# Patient Record
Sex: Male | Born: 1960 | Race: White | Hispanic: No | Marital: Married | Smoking: Never smoker
Health system: Southern US, Community
[De-identification: ages and names within clinical notes are randomized; demographics above are authoritative.]

## PROBLEM LIST (undated history)

## (undated) DIAGNOSIS — I350 Nonrheumatic aortic (valve) stenosis: Secondary | ICD-10-CM

## (undated) DIAGNOSIS — K219 Gastro-esophageal reflux disease without esophagitis: Secondary | ICD-10-CM

## (undated) DIAGNOSIS — Z789 Other specified health status: Secondary | ICD-10-CM

## (undated) DIAGNOSIS — M1990 Unspecified osteoarthritis, unspecified site: Secondary | ICD-10-CM

## (undated) DIAGNOSIS — D649 Anemia, unspecified: Secondary | ICD-10-CM

## (undated) DIAGNOSIS — K769 Liver disease, unspecified: Secondary | ICD-10-CM

## (undated) DIAGNOSIS — G4730 Sleep apnea, unspecified: Secondary | ICD-10-CM

## (undated) DIAGNOSIS — E039 Hypothyroidism, unspecified: Secondary | ICD-10-CM

## (undated) DIAGNOSIS — R0600 Dyspnea, unspecified: Secondary | ICD-10-CM

## (undated) DIAGNOSIS — K759 Inflammatory liver disease, unspecified: Secondary | ICD-10-CM

## (undated) DIAGNOSIS — R55 Syncope and collapse: Secondary | ICD-10-CM

## (undated) DIAGNOSIS — H905 Unspecified sensorineural hearing loss: Secondary | ICD-10-CM

## (undated) DIAGNOSIS — H9190 Unspecified hearing loss, unspecified ear: Secondary | ICD-10-CM

## (undated) DIAGNOSIS — Z9889 Other specified postprocedural states: Secondary | ICD-10-CM

## (undated) DIAGNOSIS — R112 Nausea with vomiting, unspecified: Secondary | ICD-10-CM

## (undated) DIAGNOSIS — J449 Chronic obstructive pulmonary disease, unspecified: Secondary | ICD-10-CM

## (undated) DIAGNOSIS — Z87442 Personal history of urinary calculi: Secondary | ICD-10-CM

## (undated) DIAGNOSIS — E785 Hyperlipidemia, unspecified: Secondary | ICD-10-CM

## (undated) DIAGNOSIS — I10 Essential (primary) hypertension: Secondary | ICD-10-CM

## (undated) DIAGNOSIS — I509 Heart failure, unspecified: Secondary | ICD-10-CM

## (undated) DIAGNOSIS — R1900 Intra-abdominal and pelvic swelling, mass and lump, unspecified site: Secondary | ICD-10-CM

## (undated) DIAGNOSIS — E669 Obesity, unspecified: Secondary | ICD-10-CM

## (undated) DIAGNOSIS — I4892 Unspecified atrial flutter: Secondary | ICD-10-CM

## (undated) DIAGNOSIS — K7460 Unspecified cirrhosis of liver: Secondary | ICD-10-CM

## (undated) DIAGNOSIS — F1090 Alcohol use, unspecified, uncomplicated: Secondary | ICD-10-CM

## (undated) DIAGNOSIS — N400 Enlarged prostate without lower urinary tract symptoms: Secondary | ICD-10-CM

## (undated) DIAGNOSIS — Z950 Presence of cardiac pacemaker: Secondary | ICD-10-CM

## (undated) DIAGNOSIS — E079 Disorder of thyroid, unspecified: Secondary | ICD-10-CM

## (undated) DIAGNOSIS — Z7289 Other problems related to lifestyle: Secondary | ICD-10-CM

## (undated) DIAGNOSIS — I340 Nonrheumatic mitral (valve) insufficiency: Secondary | ICD-10-CM

## (undated) DIAGNOSIS — I459 Conduction disorder, unspecified: Secondary | ICD-10-CM

## (undated) DIAGNOSIS — R609 Edema, unspecified: Secondary | ICD-10-CM

## (undated) HISTORY — DX: Liver disease, unspecified: K76.9

## (undated) HISTORY — DX: Essential (primary) hypertension: I10

## (undated) HISTORY — PX: PACEMAKER INSERTION: SHX728

## (undated) HISTORY — DX: Conduction disorder, unspecified: I45.9

## (undated) HISTORY — DX: Other problems related to lifestyle: Z72.89

## (undated) HISTORY — DX: Syncope and collapse: R55

## (undated) HISTORY — DX: Gastro-esophageal reflux disease without esophagitis: K21.9

## (undated) HISTORY — DX: Nonrheumatic aortic (valve) stenosis: I35.0

## (undated) HISTORY — DX: Sleep apnea, unspecified: G47.30

## (undated) HISTORY — DX: Nonrheumatic mitral (valve) insufficiency: I34.0

## (undated) HISTORY — DX: Intra-abdominal and pelvic swelling, mass and lump, unspecified site: R19.00

## (undated) HISTORY — PX: TONSILLECTOMY AND ADENOIDECTOMY: SUR1326

## (undated) HISTORY — DX: Anemia, unspecified: D64.9

## (undated) HISTORY — DX: Benign prostatic hyperplasia without lower urinary tract symptoms: N40.0

## (undated) HISTORY — DX: Other specified health status: Z78.9

## (undated) HISTORY — DX: Hyperlipidemia, unspecified: E78.5

## (undated) HISTORY — PX: NASAL SINUS SURGERY: SHX719

## (undated) HISTORY — PX: TRANSURETHRAL RESECTION OF PROSTATE: SHX73

## (undated) HISTORY — DX: Obesity, unspecified: E66.9

## (undated) HISTORY — PX: REPAIR HYPOSPADIAS W/ URETHROPLASTY: SUR1184

## (undated) HISTORY — DX: Alcohol use, unspecified, uncomplicated: F10.90

## (undated) HISTORY — DX: Unspecified sensorineural hearing loss: H90.5

---

## 1988-09-20 HISTORY — PX: ORCHIECTOMY: SHX2116

## 1993-09-20 HISTORY — PX: A-V CARDIAC PACEMAKER INSERTION: SHX562

## 2006-09-20 HISTORY — PX: COLONOSCOPY: SHX5424

## 2006-09-30 ENCOUNTER — Ambulatory Visit: Payer: Self-pay | Admitting: Cardiology

## 2006-09-30 ENCOUNTER — Ambulatory Visit (HOSPITAL_COMMUNITY): Admission: RE | Admit: 2006-09-30 | Discharge: 2006-09-30 | Payer: Self-pay | Admitting: Cardiology

## 2006-10-28 ENCOUNTER — Ambulatory Visit: Payer: Self-pay | Admitting: Cardiology

## 2006-12-09 ENCOUNTER — Emergency Department (HOSPITAL_COMMUNITY): Admission: EM | Admit: 2006-12-09 | Discharge: 2006-12-09 | Payer: Self-pay | Admitting: Emergency Medicine

## 2006-12-20 DIAGNOSIS — E669 Obesity, unspecified: Secondary | ICD-10-CM

## 2006-12-20 HISTORY — DX: Obesity, unspecified: E66.9

## 2006-12-22 ENCOUNTER — Ambulatory Visit: Payer: Self-pay | Admitting: Cardiovascular Disease

## 2006-12-23 ENCOUNTER — Inpatient Hospital Stay (HOSPITAL_COMMUNITY): Admission: EM | Admit: 2006-12-23 | Discharge: 2006-12-24 | Payer: Self-pay | Admitting: Emergency Medicine

## 2006-12-30 ENCOUNTER — Encounter (HOSPITAL_COMMUNITY): Admission: RE | Admit: 2006-12-30 | Discharge: 2007-01-29 | Payer: Self-pay | Admitting: Cardiology

## 2006-12-30 ENCOUNTER — Ambulatory Visit: Payer: Self-pay | Admitting: Cardiology

## 2007-01-04 ENCOUNTER — Ambulatory Visit: Payer: Self-pay | Admitting: Cardiology

## 2007-01-06 ENCOUNTER — Emergency Department (HOSPITAL_COMMUNITY): Admission: EM | Admit: 2007-01-06 | Discharge: 2007-01-06 | Payer: Self-pay | Admitting: Emergency Medicine

## 2007-01-26 ENCOUNTER — Emergency Department (HOSPITAL_COMMUNITY): Admission: EM | Admit: 2007-01-26 | Discharge: 2007-01-26 | Payer: Self-pay | Admitting: Emergency Medicine

## 2007-02-06 ENCOUNTER — Emergency Department (HOSPITAL_COMMUNITY): Admission: EM | Admit: 2007-02-06 | Discharge: 2007-02-06 | Payer: Self-pay | Admitting: Emergency Medicine

## 2007-02-09 ENCOUNTER — Emergency Department (HOSPITAL_COMMUNITY): Admission: EM | Admit: 2007-02-09 | Discharge: 2007-02-09 | Payer: Self-pay | Admitting: Emergency Medicine

## 2007-02-22 ENCOUNTER — Ambulatory Visit: Payer: Self-pay | Admitting: Internal Medicine

## 2007-02-23 ENCOUNTER — Emergency Department (HOSPITAL_COMMUNITY): Admission: EM | Admit: 2007-02-23 | Discharge: 2007-02-23 | Payer: Self-pay | Admitting: Emergency Medicine

## 2007-05-08 ENCOUNTER — Ambulatory Visit: Payer: Self-pay | Admitting: Cardiology

## 2007-06-29 ENCOUNTER — Emergency Department (HOSPITAL_COMMUNITY): Admission: EM | Admit: 2007-06-29 | Discharge: 2007-06-29 | Payer: Self-pay | Admitting: Emergency Medicine

## 2007-08-03 ENCOUNTER — Ambulatory Visit: Payer: Self-pay | Admitting: Cardiology

## 2007-09-28 ENCOUNTER — Emergency Department (HOSPITAL_COMMUNITY): Admission: EM | Admit: 2007-09-28 | Discharge: 2007-09-29 | Payer: Self-pay | Admitting: Emergency Medicine

## 2007-11-23 ENCOUNTER — Ambulatory Visit: Payer: Self-pay | Admitting: Cardiology

## 2007-12-18 ENCOUNTER — Inpatient Hospital Stay (HOSPITAL_COMMUNITY): Admission: EM | Admit: 2007-12-18 | Discharge: 2007-12-22 | Payer: Self-pay | Admitting: Emergency Medicine

## 2007-12-20 ENCOUNTER — Ambulatory Visit: Payer: Self-pay | Admitting: Internal Medicine

## 2008-01-23 ENCOUNTER — Ambulatory Visit: Payer: Self-pay | Admitting: Internal Medicine

## 2008-02-09 ENCOUNTER — Ambulatory Visit: Payer: Self-pay | Admitting: Internal Medicine

## 2008-02-09 ENCOUNTER — Ambulatory Visit (HOSPITAL_COMMUNITY): Admission: RE | Admit: 2008-02-09 | Discharge: 2008-02-09 | Payer: Self-pay | Admitting: Internal Medicine

## 2008-02-09 ENCOUNTER — Encounter: Payer: Self-pay | Admitting: Internal Medicine

## 2008-02-27 ENCOUNTER — Emergency Department (HOSPITAL_COMMUNITY): Admission: EM | Admit: 2008-02-27 | Discharge: 2008-02-27 | Payer: Self-pay | Admitting: Emergency Medicine

## 2008-03-20 ENCOUNTER — Ambulatory Visit: Payer: Self-pay | Admitting: Internal Medicine

## 2008-03-27 ENCOUNTER — Ambulatory Visit: Payer: Self-pay | Admitting: Gastroenterology

## 2008-06-04 ENCOUNTER — Ambulatory Visit: Payer: Self-pay | Admitting: Cardiology

## 2008-06-05 ENCOUNTER — Ambulatory Visit: Payer: Self-pay | Admitting: Internal Medicine

## 2008-06-12 ENCOUNTER — Ambulatory Visit: Payer: Self-pay | Admitting: Internal Medicine

## 2008-06-13 ENCOUNTER — Ambulatory Visit: Payer: Self-pay | Admitting: Cardiology

## 2008-06-13 ENCOUNTER — Inpatient Hospital Stay (HOSPITAL_COMMUNITY): Admission: EM | Admit: 2008-06-13 | Discharge: 2008-06-16 | Payer: Self-pay | Admitting: Emergency Medicine

## 2008-06-14 ENCOUNTER — Ambulatory Visit: Payer: Self-pay | Admitting: Cardiovascular Disease

## 2008-06-14 ENCOUNTER — Other Ambulatory Visit: Payer: Self-pay | Admitting: Cardiology

## 2008-06-15 ENCOUNTER — Other Ambulatory Visit: Payer: Self-pay | Admitting: Cardiology

## 2008-06-16 ENCOUNTER — Other Ambulatory Visit: Payer: Self-pay | Admitting: Cardiology

## 2008-06-21 ENCOUNTER — Ambulatory Visit: Payer: Self-pay | Admitting: Cardiology

## 2008-06-27 DIAGNOSIS — E119 Type 2 diabetes mellitus without complications: Secondary | ICD-10-CM

## 2008-06-27 DIAGNOSIS — D649 Anemia, unspecified: Secondary | ICD-10-CM | POA: Insufficient documentation

## 2008-06-27 DIAGNOSIS — I459 Conduction disorder, unspecified: Secondary | ICD-10-CM | POA: Insufficient documentation

## 2008-06-27 DIAGNOSIS — H905 Unspecified sensorineural hearing loss: Secondary | ICD-10-CM

## 2008-07-15 ENCOUNTER — Ambulatory Visit (HOSPITAL_COMMUNITY): Admission: RE | Admit: 2008-07-15 | Discharge: 2008-07-15 | Payer: Self-pay | Admitting: Internal Medicine

## 2008-07-18 ENCOUNTER — Ambulatory Visit: Payer: Self-pay | Admitting: Internal Medicine

## 2008-07-22 ENCOUNTER — Ambulatory Visit: Payer: Self-pay | Admitting: Cardiology

## 2008-07-29 ENCOUNTER — Ambulatory Visit: Payer: Self-pay | Admitting: Internal Medicine

## 2008-08-14 ENCOUNTER — Encounter: Payer: Self-pay | Admitting: Gastroenterology

## 2008-08-14 ENCOUNTER — Encounter (INDEPENDENT_AMBULATORY_CARE_PROVIDER_SITE_OTHER): Payer: Self-pay

## 2008-08-14 LAB — CONVERTED CEMR LAB
ALT: 29 units/L (ref 0–53)
AST: 35 units/L (ref 0–37)
Albumin: 4.5 g/dL (ref 3.5–5.2)
Alkaline Phosphatase: 191 units/L — ABNORMAL HIGH (ref 39–117)
Basophils Absolute: 0.1 10*3/uL (ref 0.0–0.1)
Bilirubin, Direct: 0.5 mg/dL — ABNORMAL HIGH (ref 0.0–0.3)
Eosinophils Absolute: 0.3 10*3/uL (ref 0.0–0.7)
HCT: 37.5 % — ABNORMAL LOW (ref 39.0–52.0)
Hemoglobin: 10.6 g/dL — ABNORMAL LOW (ref 13.0–17.0)
Indirect Bilirubin: 1.7 mg/dL — ABNORMAL HIGH (ref 0.0–0.9)
Lymphocytes Relative: 17 % (ref 12–46)
Lymphs Abs: 1.8 10*3/uL (ref 0.7–4.0)
MCV: 82.8 fL
Monocytes Absolute: 1.4 10*3/uL — ABNORMAL HIGH (ref 0.1–1.0)
Neutro Abs: 7.2 10*3/uL (ref 1.7–7.7)
Neutrophils Relative %: 67 % (ref 43–77)
RBC: 4.53 M/uL (ref 4.22–5.81)
Total Bilirubin: 2.2 mg/dL — ABNORMAL HIGH (ref 0.3–1.2)
Total Protein: 6.6 g/dL (ref 6.0–8.3)
WBC: 10.7 10*3/uL
WBC: 10.7 10*3/uL — ABNORMAL HIGH (ref 4.0–10.5)

## 2008-09-27 ENCOUNTER — Encounter (INDEPENDENT_AMBULATORY_CARE_PROVIDER_SITE_OTHER): Payer: Self-pay | Admitting: General Surgery

## 2008-09-27 ENCOUNTER — Ambulatory Visit (HOSPITAL_COMMUNITY): Admission: RE | Admit: 2008-09-27 | Discharge: 2008-09-27 | Payer: Self-pay | Admitting: General Surgery

## 2008-10-10 ENCOUNTER — Ambulatory Visit: Payer: Self-pay | Admitting: Cardiology

## 2008-10-29 ENCOUNTER — Emergency Department (HOSPITAL_COMMUNITY): Admission: EM | Admit: 2008-10-29 | Discharge: 2008-10-29 | Payer: Self-pay | Admitting: Emergency Medicine

## 2008-10-31 ENCOUNTER — Ambulatory Visit: Payer: Self-pay | Admitting: Cardiology

## 2008-10-31 ENCOUNTER — Encounter: Payer: Self-pay | Admitting: Internal Medicine

## 2008-11-01 ENCOUNTER — Ambulatory Visit: Payer: Self-pay | Admitting: Cardiology

## 2008-11-01 ENCOUNTER — Encounter: Payer: Self-pay | Admitting: Physician Assistant

## 2008-11-15 ENCOUNTER — Ambulatory Visit: Payer: Self-pay | Admitting: Internal Medicine

## 2008-12-02 ENCOUNTER — Encounter: Payer: Self-pay | Admitting: Internal Medicine

## 2009-01-17 ENCOUNTER — Emergency Department (HOSPITAL_COMMUNITY): Admission: EM | Admit: 2009-01-17 | Discharge: 2009-01-17 | Payer: Self-pay | Admitting: Emergency Medicine

## 2009-01-17 ENCOUNTER — Encounter (INDEPENDENT_AMBULATORY_CARE_PROVIDER_SITE_OTHER): Payer: Self-pay | Admitting: *Deleted

## 2009-02-06 ENCOUNTER — Encounter: Payer: Self-pay | Admitting: Cardiology

## 2009-02-06 ENCOUNTER — Ambulatory Visit: Payer: Self-pay | Admitting: Cardiology

## 2009-02-19 ENCOUNTER — Telehealth: Payer: Self-pay | Admitting: Cardiology

## 2009-05-31 ENCOUNTER — Emergency Department: Payer: Self-pay | Admitting: Emergency Medicine

## 2009-08-12 ENCOUNTER — Ambulatory Visit: Payer: Self-pay | Admitting: Cardiology

## 2009-09-08 ENCOUNTER — Encounter: Payer: Self-pay | Admitting: Internal Medicine

## 2009-09-08 ENCOUNTER — Ambulatory Visit: Payer: Self-pay | Admitting: Cardiology

## 2009-12-20 ENCOUNTER — Emergency Department (HOSPITAL_COMMUNITY): Admission: EM | Admit: 2009-12-20 | Discharge: 2009-12-20 | Payer: Self-pay | Admitting: Emergency Medicine

## 2010-01-27 LAB — CONVERTED CEMR LAB
Creatinine, Ser: 1.13 mg/dL
Glucose, Bld: 110 mg/dL
Hgb A1c MFr Bld: 6.8 %
Sodium: 140 meq/L
TSH: 1.77 microintl units/mL

## 2010-03-18 ENCOUNTER — Ambulatory Visit: Payer: Self-pay | Admitting: Internal Medicine

## 2010-03-18 DIAGNOSIS — Z950 Presence of cardiac pacemaker: Secondary | ICD-10-CM | POA: Insufficient documentation

## 2010-05-19 ENCOUNTER — Ambulatory Visit: Payer: Self-pay | Admitting: Cardiology

## 2010-05-19 DIAGNOSIS — K219 Gastro-esophageal reflux disease without esophagitis: Secondary | ICD-10-CM

## 2010-05-19 LAB — CONVERTED CEMR LAB
HDL: 33 mg/dL — ABNORMAL LOW (ref >39)
Total CHOL/HDL Ratio: 4.3
VLDL: 36 mg/dL (ref 0–40)

## 2010-10-02 ENCOUNTER — Ambulatory Visit: Admit: 2010-10-02 | Payer: Self-pay | Admitting: Internal Medicine

## 2010-10-18 ENCOUNTER — Encounter: Payer: Self-pay | Admitting: Internal Medicine

## 2010-10-20 NOTE — Assessment & Plan Note (Signed)
Summary: 6 mth f/u per checkout on 09/08/09/tg   Visit Type:  Follow-up Referring Provider:  Leticia Penna Primary Provider:  Illene Regulus  CC:  little chest discomfort last week.  History of Present Illness: [name] returns today for followup.  He is a very pleasant, middle-aged male with symptomatic bradycardia, diabetes, hypertension, dyslipidemia, and sleep apnea who returns today for followup.  He admits to dietary indiscretion.  He denies c/p or sob.  He has had some problems with peripheral edema. No syncope.  Current Medications (verified): 1)  Amlodipine Besylate 2.5 Mg Tabs (Amlodipine Besylate) .... Take 1 Tablet By Mouth Once A Day 2)  Atenolol 50 Mg Tabs (Atenolol) .... Take One Tablet By Mouth Daily 3)  Aspirin 81 Mg Tbec (Aspirin) .... Take One Tablet By Mouth Daily 4)  Fexofenadine Hcl 180 Mg Tabs (Fexofenadine Hcl) .... Take 1 Tab Daily 5)  Centrum  Tabs (Multiple Vitamins-Minerals) .Marland Kitchen.. 1 Daily 6)  Actos 45 Mg Tabs (Pioglitazone Hcl) .Marland Kitchen.. 1 Daily 7)  Citalopram Hydrobromide 20 Mg Tabs (Citalopram Hydrobromide) .Marland Kitchen.. 1 Daily 8)  Enalapril Maleate 5 Mg Tabs (Enalapril Maleate) .... Take One Tablet By Mouth Q Day 9)  Metformin Hcl 500 Mg Tabs (Metformin Hcl) .... Take 2 Tabs Two Times A Day 10)  Advair Diskus 100-50 Mcg/dose Misc (Fluticasone-Salmeterol) .... Every 12 Hours 11)  Nitroglycerin 0.4 Mg Subl (Nitroglycerin) .... One Tablet Under Tongue Every 5 Minutes As Needed For Chest Pain---May Repeat Times Three 12)  Mylanta Supreme 400-135 Mg/49ml Susp (Ca Carbonate-Mag Hydroxide) .... As Needed 13)  Omeprazole 40 Mg Cpdr (Omeprazole) .... Take 1 Tablet By Mouth Once A Day 14)  Nasonex 50 Mcg/act Susp (Mometasone Furoate) .... 2 Sprays Each Nostril Once Daily 15)  Levoxyl 50 Mcg Tabs (Levothyroxine Sodium) .... Take 1 Tab Daily 16)  Glucosamine-Chondroitin 1500-1200 Mg/47ml Liqd (Glucosamine-Chondroitin) .... Take 1 Tab Daily  Allergies (verified): 1)  ! Pcn  Past  History:  Past Medical History: Last updated: 08/12/2009 Chest pain.  2002 & 2009: cardiac catheterization-> normal coronary       arteries; nl. left ventricular function. Conduction System Disease: status post pacemaker implantation in 1995.  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 Hypertension  Dyslipidemia; statin discontinued due to abn. LFTs  Gastroesophageal reflux disease; status post multiple dilatations for stricture Congenital deafness Sleep apnea: BiPAP and continuous oxygen Obesity:4/08: BMI=40 Anemia Remote alcohol use      Past Surgical History: Last updated: 08/12/2009 Orchiectomy-bilateral circa 1990-? neoplasm Repair of hypospadia TURP for BPH Tonsillectomy Duel chamber pacemaker implantation-1995  Review of Systems       The patient complains of peripheral edema.  The patient denies chest pain, syncope, and dyspnea on exertion.    Vital Signs:  Patient profile:   50 year old male Weight:      196 pounds BMI:     33.76 Pulse rate:   95 / minute BP sitting:   111 / 74  (right arm)  Vitals Entered By: Dreama Saa, CNA (March 18, 2010 10:11 AM)  Physical Exam  General:   General-Well developed; no acute distress; overweight   Neck-No JVD; no carotid bruits: Lungs-No tachypnea, no rales; no rhonchi; no wheezes: Well healed PPM incision. Cardiovascular-normal PMI; normal S1 and S2; grade 2/6 systolic ejection murmur at the cardiac base Abdomen-BS normal; soft and non-tender without masses or organomegaly:  Musculoskeletal-No  deformities, no cyanosis or clubbing: Neurologic-Normal cranial nerves; symmetric strength and tone:  Skin-Warm,smooth and hairless skin over the lower extremities with surface erythema and desquamation Extremities-Nl distal pulses; no  edema:     PPM Specifications Following MD:  Lewayne Bunting, MD     PPM Vendor:  Medtronic     PPM Model Number:  [id number]     PPM Serial Number:  [id number] PPM DOI:  06/12/2004      Lead 1    Location: [facility]     DOI: 06/12/2004     Model #: 4524     Serial #: [id number]     Status: active Lead 2    Location: [facility]     DOI: 06/12/2004     Model #: [id number]     Serial #: [id number]      Magnet Response Rate:  BOL 85 ERI 65  Indications:  Brady  Explantation Comments:  No Carelink +Coumadin  PPM Follow Up Battery Voltage:  2.76 V     Battery Est. Longevity:  3.87yrs     Pacer Dependent:  No       PPM Device Measurements Atrium  Amplitude: 2.80 mV, Impedance: 366 ohms, Threshold: 1.00 V at 0.40 msec Right Ventricle  Amplitude: 11.20 mV, Impedance: 483 ohms, Threshold: 0.50 V at 0.40 msec  Episodes MS Episodes:  196     Percent Mode Switch:  <0.1%     Coumadin:  No Ventricular High Rate:  0     Atrial Pacing:  69.8%     Ventricular Pacing:  61.9%  Parameters Mode:  DDDR     Lower Rate Limit:  60     Upper Rate Limit:  130 Paced AV Delay:  150     Sensed AV Delay:  120 Tech Comments:  LONGEST AHR EPISODE 9 MINUTES 8 SECONDS W/MAX A RATE AT 239.  NORMAL DEVICE FUNCTION.  CHANGED A AMPLITUDE FROM 1.750 TO 2.00 V.  ROV IN CLINIC RDS. Vella Kohler  March 18, 2010 10:59 AM MD Comments:  Agree with above.    Impression & Recommendations:  Problem # 1:  CARDIAC PACEMAKER IN SITU (ICD-V45.01) His device is working normally.  Will recheck in several months.  Problem # 2:  HYPERTENSION, BENIGN (ICD-401.1) His blood pressure is well controlled.  We discussed the importance of a low sodium diet.  He will continue meds as below. His updated medication list for this problem includes:    Amlodipine Besylate 2.5 Mg Tabs (Amlodipine besylate) .Marland Kitchen... Take 1 tablet by mouth once a day    Atenolol 50 Mg Tabs (Atenolol) .Marland Kitchen... Take one tablet by mouth daily    Aspirin 81 Mg Tbec (Aspirin) .Marland Kitchen... Take one  tablet by mouth daily    Enalapril Maleate 5 Mg Tabs (Enalapril maleate) .Marland Kitchen... Take one tablet by mouth q day  Problem # 3:  MORBID OBESITY (ICD-278.01) He continues to have problems with dietary indiscretion.  I have encouraged him on weight loss.  Patient Instructions: 1)  Your physician recommends that you schedule a follow-up appointment in: 1 year 2)  Your physician recommends that you continue on your current medications as directed. Please refer to the Current Medication list given to you today.

## 2010-10-20 NOTE — Assessment & Plan Note (Signed)
Summary: [name]   Visit Type:  Follow-up Referring [name]:  [facility] Primary [name]:  Dr. Karrie Doffing   History of Present Illness: [name] returns to the office as scheduled for continuing assessment and treatment of multiple issues including conduction system disease with permanent pacemaker placement in 1995, generator replacement in 2005, mild aortic stenosis, hypertension, hyperlipidemia in the setting of diabetes, obesity with sleep apnea and chest pain with normal coronary arteries in 05/2008.  Over the past 9 months ago, his health status has been better than at any time since I first met him 4 years ago.  He has had no chest discomfort and has used no nitroglycerin.  He is active, caring for his yard, without any difficulty.  He denies dyspnea, orthopnea, PND, lightheadedness or syncope.  Patient reports that he is not being treated with a statin due to hepatic abnormalities.  He has had mild transaminase elevations in the past with an inconclusive laparoscopic liver biopsy in 2010.  He was previously thought to have NASH, but this was not apparent on biopsy.  -  Date:  01/27/2010    HgbA1c: 6.8    TSH: 1.77    BG Random: 110    BUN: 25    Creatinine: 1.13    Sodium: 140    Potassium: 4.9    Chloride: 103    CO2 Total: 22   Current Medications (verified): 1)  Amlodipine Besylate 2.5 Mg Tabs (Amlodipine Besylate) .... Take 1 Tablet By Mouth Once A Day 2)  Atenolol 50 Mg Tabs (Atenolol) .... Take One Tablet By Mouth Daily 3)  Aspirin 81 Mg Tbec (Aspirin) .... Take One Tablet By Mouth Daily 4)  Fexofenadine Hcl 180 Mg Tabs (Fexofenadine Hcl) .... Take 1 Tab Daily 5)  Centrum  Tabs (Multiple Vitamins-Minerals) .Marland Kitchen.. 1 Daily 6)  Actos 45 Mg Tabs (Pioglitazone Hcl) .Marland Kitchen.. 1 Daily 7)  Citalopram Hydrobromide 20 Mg Tabs (Citalopram Hydrobromide) .Marland Kitchen.. 1 Daily 8)  Enalapril Maleate 5 Mg Tabs (Enalapril Maleate) .... Take One Tablet By Mouth Q Day 9)  Advair Diskus  100-50 Mcg/dose Misc (Fluticasone-Salmeterol) .... Every 12 Hours 10)  Nitroglycerin 0.4 Mg Subl (Nitroglycerin) .... One Tablet Under Tongue Every 5 Minutes As Needed For Chest Pain---May Repeat Times Three 11)  Mylanta Supreme 400-135 Mg/15ml Susp (Ca Carbonate-Mag Hydroxide) .... As Needed 12)  Omeprazole 40 Mg Cpdr (Omeprazole) .... Take 1 Tablet By Mouth Once A Day 13)  Nasonex 50 Mcg/act Susp (Mometasone Furoate) .... 2 Sprays Each Nostril Once Daily 14)  Levoxyl 50 Mcg Tabs (Levothyroxine Sodium) .... Take 1 Tab Daily 15)  Glucosamine-Chondroitin 1500-1200 Mg/61ml Liqd (Glucosamine-Chondroitin) .... Take 1 Tab Daily  Allergies (verified): 1)  ! Pcn  Past History:  PMH, FH, and Social History reviewed and updated.  Past Medical History: Chest pain.  2002 & 2009: cardiac catheterization-> normal coronary       arteries; nl. left ventricular function. Conduction System Disease: status post pacemaker implantation in 1995.  Generator replaced 2005  Aortic stenosis-mild Mitral regurgitation-insignificant;  Hypertension  Dyslipidemia; statin discontinued due to abn. 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 Benign prostatic hypertrophy with urinary retention; repaired hypospadia; transurethral resection of the prostate  Gastroesophageal reflux disease; status post multiple dilatations for stricture Congenital deafness Sleep apnea: BiPAP and continuous oxygen Obesity:4/08: BMI=40 Anemia Remote alcohol use      Past Surgical  History: Orchiectomy-bilateral circa 1990-? neoplasm Repair of hypospadia TURP for BPH Tonsillectomy and adenoidectomy Duel chamber pacemaker implantation-1995  Review of Systems       See history of present illness.  Vital Signs:  Patient profile:   50 year old male Weight:      199  pounds BMI:     34.28 Pulse rate:   80 / minute BP sitting:   101 / 67  (right arm)  Vitals Entered By: Dreama Saa, CNA (May 19, 2010 10:54 AM)  Physical Exam  General:  Obese; well developed; no acute distress; HEENT-extorted left eye   Neck-No JVD; no carotid bruits, but a faint transmitted bruit to the left carotid is noted Lungs-No tachypnea, no rales; no rhonchi; no wheezes Cardiovascular-normal PMI; distant S1; decreased S2; grade 2/6 systolic ejection murmur at the cardiac base Abdomen-BS normal; soft and non-tender without masses or organomegaly:  Musculoskeletal-No deformities, no cyanosis or clubbing: Neurologic-Normal cranial nerves; symmetric strength and tone:  Skin-Warm,smooth and hairless skin over the lower extremities with surface erythema and desquamation Extremities-Nl distal pulses; no edema:     PPM Specifications Following MD:  Lewayne Bunting, MD     PPM Vendor:  [employer]     PPM Model Number:  [id number]     PPM Serial Number:  [id number] PPM DOI:  06/12/2004      Lead 1    Location: [facility]     DOI: 06/12/2004     Model #: 4524     Serial #: [id number]     Status: active Lead 2    Location: [facility]     DOI: 06/12/2004     Model #: [id number]     Serial #: [id number]      Magnet Response Rate:  BOL 85 ERI 65  Indications:  Brady  Explantation Comments:  No Carelink +Coumadin  PPM Follow Up Pacer Dependent:  No      Episodes Coumadin:  No  Parameters Mode:  DDDR     Lower Rate Limit:  60     Upper Rate Limit:  130 Paced AV Delay:  150     Sensed AV Delay:  120  Impression & Recommendations:  Problem # 1:  CARDIAC PACEMAKER IN SITU (ICD-V45.01) Evaluated in person by Dr. Ladona Ridgel every 6 months; home monitoring is not being utilized.  Problem # 2:  AORTIC STENOSIS/ INSUFFICIENCY, NON-RHEUMATIC (ICD-424.1) No symptoms.  A repeat echocardiogram will be obtained in a year or 2.  Problem # 3:  HYPERTENSION (ICD-401.1) Blood pressure is well controlled.  Problem #  4:  HYPERLIPIDEMIA (ICD-272.2) In the setting of diabetes, treatment is warranted; however, patient raises an issue with respect to hepatic disease.  Prior records were obtained and reviewed.  Biopsy in 2010 did not show a reason for abnormal LFTs.  Peak SGOT and SGPT were well less than 3 x the upper limit of normal.  A CMet will be obtained.   Most likely, a statin he has not previously taken can be added to his medical regime and titrated.  If laboratory results are acceptable, I will plan to reassess this nice gentleman in 11 months.  Other Orders: T-Lipid Profile [phone])  Patient Instructions: 1)  Your physician recommends that you schedule a follow-up appointment in: 11 months (same day as wife's appt.) 2)  Your physician recommends that you continue on your current medications as directed. Please refer to the Current Medication list given to you today. 3)  Your physician recommends that  you return for lab work in: Today

## 2010-10-20 NOTE — Cardiovascular Report (Signed)
Summary: Office Visit   Office Visit   Imported By: Roderic Ovens 09/25/2009 14:08:57  _____________________________________________________________________  External Attachment:    Type:   Image     Comment:   External Document

## 2010-10-28 NOTE — Miscellaneous (Signed)
Summary: corrected device information  Clinical Lists Changes  Observations: Added new observation of PPMLEADSTAT2: active (10/18/2010 15:24) Added new observation of PPMLEADSER2: [id number] (10/18/2010 15:24) Added new observation of PPMLEADMOD2: 5024  (10/18/2010 15:24)      PPM Specifications Following MD:  Lewayne Bunting, MD     PPM Vendor:  [employer]     PPM Model Number:  [id number]     PPM Serial Number:  [id number] PPM DOI:  06/12/2004      Lead 1    Location: [facility]     DOI: 06/12/2004     Model #: 4524     Serial #: [id number]     Status: active Lead 2    Location: [facility]     DOI: 06/12/2004     Model #: 4696     Serial #: [id number]     Status: active  Magnet Response Rate:  BOL 85 ERI 65  Indications:  Huston Foley  Explantation Comments:  No Carelink +Coumadin  PPM Follow Up Pacer Dependent:  No      Episodes Coumadin:  No  Parameters Mode:  DDDR     Lower Rate Limit:  60     Upper Rate Limit:  130 Paced AV Delay:  150     Sensed AV Delay:  120

## 2010-11-30 ENCOUNTER — Encounter (INDEPENDENT_AMBULATORY_CARE_PROVIDER_SITE_OTHER): Payer: PRIVATE HEALTH INSURANCE

## 2010-11-30 ENCOUNTER — Encounter: Payer: Self-pay | Admitting: Internal Medicine

## 2010-11-30 DIAGNOSIS — I495 Sick sinus syndrome: Secondary | ICD-10-CM

## 2010-12-08 NOTE — Cardiovascular Report (Signed)
Summary: Office Visit   Office Visit   Imported By: Roderic Ovens 12/04/2010 15:32:17  _____________________________________________________________________  External Attachment:    Type:   Image     Comment:   External Document

## 2010-12-08 NOTE — Procedures (Signed)
Summary: pacer check/medtronic / SCH PER PT CALL.TMJ R.S PER PT WIFE/T...   Current Medications (verified): 1)  Amlodipine Besylate 2.5 Mg Tabs (Amlodipine Besylate) .... Take 1 Tablet By Mouth Once A Day 2)  Atenolol 50 Mg Tabs (Atenolol) .... Take One Tablet By Mouth Daily 3)  Aspirin 81 Mg Tbec (Aspirin) .... Take One Tablet By Mouth Daily 4)  Centrum  Tabs (Multiple Vitamins-Minerals) .Marland Kitchen.. 1 Daily 5)  Actos 45 Mg Tabs (Pioglitazone Hcl) .Marland Kitchen.. 1 Daily 6)  Citalopram Hydrobromide 20 Mg Tabs (Citalopram Hydrobromide) .Marland Kitchen.. 1 Daily 7)  Enalapril Maleate 5 Mg Tabs (Enalapril Maleate) .... Take One Tablet By Mouth Q Day 8)  Nitroglycerin 0.4 Mg Subl (Nitroglycerin) .... One Tablet Under Tongue Every 5 Minutes As Needed For Chest Pain---May Repeat Times Three 9)  Mylanta Supreme 400-135 Mg/65ml Susp (Ca Carbonate-Mag Hydroxide) .... As Needed 10)  Omeprazole 40 Mg Cpdr (Omeprazole) .... Take 1 Tablet By Mouth Once A Day 11)  Nasonex 50 Mcg/act Susp (Mometasone Furoate) .... 2 Sprays Each Nostril Once Daily 12)  Levoxyl 50 Mcg Tabs (Levothyroxine Sodium) .... Take 1 Tab Daily 13)  Glucosamine-Chondroitin 1500-1200 Mg/33ml Liqd (Glucosamine-Chondroitin) .... Take 1 Tab Daily 14)  Glipizide 5 Mg Tabs (Glipizide) .... One By Mouth Daily 15)  Loratadine 10 Mg Tabs (Loratadine) .... One By Mouth Daily  Allergies (verified): 1)  ! Pcn   PPM Specifications Following MD:  Lewayne Bunting, MD     PPM Vendor:  [employer]     PPM Model Number:  [id number]     PPM Serial Number:  [id number] PPM DOI:  06/12/2004      Lead 1    Location: [facility]     DOI: 06/12/2004     Model #: 4524     Serial #: [id number]     Status: active Lead 2    Location: [facility]     DOI: 06/12/2004     Model #: 2130     Serial #: [id number]     Status: active  Magnet Response Rate:  BOL 85 ERI 65  Indications:  Huston Foley  Explantation Comments:  No Carelink   PPM Follow Up Pacer Dependent:  No      Episodes Coumadin:  No  Parameters Mode:   DDDR     Lower Rate Limit:  60     Upper Rate Limit:  130 Paced AV Delay:  150     Sensed AV Delay:  120 Tech Comments:  See [employer]

## 2011-01-04 LAB — BASIC METABOLIC PANEL
BUN: 17 mg/dL (ref 6–23)
CO2: 27 mEq/L (ref 19–32)
Chloride: 102 mEq/L (ref 96–112)
Creatinine, Ser: 1.02 mg/dL (ref 0.4–1.5)
GFR calc Af Amer: >60 mL/min (ref >60)
Potassium: 4.8 mEq/L (ref 3.5–5.1)

## 2011-01-04 LAB — CBC
HCT: 35.6 % — ABNORMAL LOW (ref 39.0–52.0)
MCHC: 31.8 g/dL (ref 30.0–36.0)
MCV: 78.9 fL (ref 78.0–100.0)
RBC: 4.52 MIL/uL (ref 4.22–5.81)

## 2011-01-04 LAB — HEPATIC FUNCTION PANEL
Alkaline Phosphatase: 198 U/L — ABNORMAL HIGH (ref 39–117)
Bilirubin, Direct: 0.5 mg/dL — ABNORMAL HIGH (ref 0.0–0.3)
Indirect Bilirubin: 1.3 mg/dL — ABNORMAL HIGH (ref 0.3–0.9)
Total Protein: 6 g/dL (ref 6.0–8.3)

## 2011-01-04 LAB — GLUCOSE, CAPILLARY
Glucose-Capillary: 149 mg/dL — ABNORMAL HIGH (ref 70–99)
Glucose-Capillary: 174 mg/dL — ABNORMAL HIGH (ref 70–99)

## 2011-01-05 LAB — DIFFERENTIAL
Basophils Absolute: 0.1 10*3/uL (ref 0.0–0.1)
Eosinophils Absolute: 0.2 10*3/uL (ref 0.0–0.7)
Eosinophils Relative: 2 % (ref 0–5)
Lymphs Abs: 1.2 10*3/uL (ref 0.7–4.0)
Neutrophils Relative %: 77 % (ref 43–77)

## 2011-01-05 LAB — POCT CARDIAC MARKERS
CKMB, poc: 10.8 ng/mL (ref 1.0–8.0)
CKMB, poc: 7.9 ng/mL (ref 1.0–8.0)
Myoglobin, poc: 225 ng/mL (ref 12–200)
Myoglobin, poc: 276 ng/mL (ref 12–200)
Troponin i, poc: <0.05 ng/mL (ref 0.00–0.09)

## 2011-01-05 LAB — CBC
MCV: 78.9 fL (ref 78.0–100.0)
Platelets: 213 10*3/uL (ref 150–400)
RDW: 20.5 % — ABNORMAL HIGH (ref 11.5–15.5)
WBC: 10.2 10*3/uL (ref 4.0–10.5)

## 2011-01-05 LAB — CK TOTAL AND CKMB (NOT AT ARMC): Relative Index: 4.2 — ABNORMAL HIGH (ref 0.0–2.5)

## 2011-01-05 LAB — PROTIME-INR
INR: 1.2 (ref 0.00–1.49)
Prothrombin Time: 15.8 seconds — ABNORMAL HIGH (ref 11.6–15.2)

## 2011-01-05 LAB — BASIC METABOLIC PANEL
BUN: 22 mg/dL (ref 6–23)
Chloride: 104 mEq/L (ref 96–112)
Creatinine, Ser: 1.01 mg/dL (ref 0.4–1.5)
GFR calc non Af Amer: >60 mL/min (ref >60)
Glucose, Bld: 117 mg/dL — ABNORMAL HIGH (ref 70–99)

## 2011-02-02 NOTE — H&P (Signed)
NAME:  [name], [name] NO.:  [id number]   MEDICAL RECORD NO.:  [id number]          PATIENT TYPE:  INP   LOCATION:  A321                          FACILITY:  [facility]   PHYSICIAN:  Osvaldo Shipper, MD     DATE OF BIRTH:  09/15/1961   DATE OF ADMISSION:  06/13/2008  DATE OF DISCHARGE:  LH                              HISTORY & PHYSICAL   PRIMARY CARE PHYSICIAN:  Karrie Doffing MD in [place].   CARDIOLOGIST:  Gerrit Friends. Dietrich Pates, MD, [facility] from [facility].   He also has a urologist that he used to see in [facility].   ADMISSION DIAGNOSIS:  1. Chest pain rule out acute coronary syndrome, rule out pulmonary      embolism.  2. History of type 2 diabetes.  3. Hypertension.  4. Chronic obstructive pulmonary disease.  5. Obstructive sleep apnea.   CHIEF COMPLAINT:  Chest pain since this evening.   HISTORY OF PRESENT ILLNESS:  Patient is 50 year old male who has a  history of pacemaker placement for significant bradycardia in the past  who was working when he experienced sharp retrosternal chest pain which  was 5/10 in intensity.  The patient was sitting while this pain started.  The pain increased with deep breathing and cough.  He was having a dry  cough as well.  He was also short of breath.  The patient is not a good  historian.  He is also hard of hearing which makes history very  difficult to obtain.  Apparently the patient was having pain yesterday  as well.  He admits to some wheezing with the shortness of breath.  No  other aggravating or relieving factors for the pain is identified.  No  nausea, vomiting, fever is present.  He has been experiencing chest pain  on and off for the past many months and he takes sublingual  nitroglycerin for this pain.  It is unclear if today's pain is similar  to the pain he has been having in the past.   MEDICATIONS AT HOME:  He is on the following:  1. Centrum Silver 1 tablet daily.  2. Aspirin 81  mg daily.  3. Metformin extended release 1000 mg b.i.d.  4. Actos 45 mg daily.  5. Nitroglycerin sublingually as needed.  6. Citalopram 20 mg at bedtime.  7. Protonix 40 mg daily.  8. Fexofenadine 180 mg daily.  9. Atenolol 50 mg daily.  10.Albuterol nebulizer four times a day.  11.Advair Diskus 500/50 b.i.d.   ALLERGIES:  He is allergic to PENICILLIN.   PAST MEDICAL HISTORY:  Is positive for aortic stenosis, type 2 diabetes,  dyslipidemia, hypertension, pacemaker placement, obstructive sleep  apnea, apparently had a cardiac cath about 5 years ago which did not  show any obstructive CAD.  He has never had an MRI or any significant  coronary artery disease in the past.  He did have a negative stress test  in 2008.  He has had sinus surgeries, tonsillectomy and bilateral  orchiectomy for unclear reasons.  He also had some kind of apparently GI  surgery as well which he is unable to explain to me.   He also has COPD and is on O2 at home.  He also has elevated alkaline  phosphatase and bilirubin for which he follows Dr. Kendell Bane and no clear  etiology has been found.   He also has a pelvic mass which is not being evaluated at this time.   SOCIAL HISTORY:  He lives in [place] with his family.  Denies  smoking or alcohol use.   FAMILY HISTORY:  Noncontributory.   REVIEW OF SYSTEMS:  Difficult to do in this patient hard-of-hearing.   PHYSICAL EXAMINATION:  VITAL SIGNS:  Temperature 97.8, blood pressure  125/74, heart rate 60, respiratory rate 20.  CBG was 134.  When he  presented to the ED his blood pressure was 139/81, heart rate 63.  GENERAL:  This is an overweight obese male in no distress.  HEENT: No pallor, no icterus.  Oral mucous membranes moist.  No oral  lesions are noted.  NECK:  Soft and supple.  No thyromegaly is appreciated.  LUNGS:  Clear to auscultation with reduced air entry at the bases.  No  wheezing is appreciated.  CARDIOVASCULAR:  S1 and S2 is normal  regular.  Systolic murmur  appreciated over the precordium.  No rubs are heard.  No bruits  appreciated.  ABDOMEN:  Soft, nontender, nondistended.  Scar noted in the lower  abdomen.  No organomegaly is appreciated.  EXTREMITIES:  Show chronic skin changes and no calf tenderness present.  Peripheral pulses are palpable.  NEUROLOGY:  No focal deficits are present.   LAB DATA:  His CBC is unremarkable.  His BMET showed mildly elevated  glucose.  Total bilirubin is 2.1, alkaline phosphatase 320, AST is 53.  Total CK was 506 with MB of 20, troponin 0.04.  MBs are coming down 13.4  is the last reading.  Troponins have been negative.  BNP was 88.   He has not had a chest x-ray.  The EKG did show sinus rhythm with a  first degree AV block interval and he does have normal axis intervals  except for the PR.  Rest of the intervals appear to be in the normal  range.  I do not see any definite Q-waves.  There is evidence for RBBB.  There is evidence for some nonspecific T-wave changes especially in V1-  V2 as well as in lead 3 and aVF.  No acute ST changes are noted.   ASSESSMENT:  This is a 50 year old African American male who has type 2  diabetes, sleep apnea, chronic obstructive pulmonary disease, who is  obese, who presents with chest pain which appears to be pleuritic in  nature to some extent.  He has never had conclusive coronary artery  disease as far as we can tell.  He does have aortic stenosis.  Differential diagnosis at this time for his chest pain includes  pulmonary embolism, acute coronary syndrome, pleurisy is also  possibilities, lung disease could also be causing chest pain.   1. Chest pain.  Repeat EKGs.  Cardiology has already seen him and they      plan to follow up on him in the morning when his cardiac enzymes      are back.  I will go ahead and do a D-dimer as he does have a few      risk factors for pulmonary embolism.  A CT of chest will be      performed if the  D-dimer is abnormal.  He is on Nitropaste which we      will continue for now.  Will continue the beta blocker and aspirin.  2. History of type 2 diabetes.  Continue with his home medications and      put him on a sliding scale.  CBGs will be checked.  3. Sleep apnea.  Continue with CPAP and oxygen.  4. History of chronic obstructive pulmonary disease.  Continue with      Advair and albuterol.  5. History of abnormal liver function tests with elevated alkaline      phosphatase.  No evaluation at this time. He is getting outpatient      evaluation by gastroenterology.  6. History of pacemaker placement appears to be stable.  He is on      telemetry.  He will be monitored closely.  7. Chest x-ray will be done to rule out pneumothorax etc.  8. Deep venous thrombosis prophylaxis will be initiated.  If his      cardiac markers indeed come back positive we will give a      therapeutic dose of Lovenox.  9. He has a history of pelvic mass which apparently is not being      evaluated anymore.  I would defer to his primary medical doctor and      the other consultants to follow up on this.   Further management decisions will depend on results of further testing  and patient's response to treatment.      Osvaldo Shipper, MD  Electronically Signed     GK/MEDQ  D:  06/13/2008  T:  06/13/2008  Job:  [id number]   cc:   Gerrit Friends. Dietrich Pates, MD, [facility]  [address]  [place], [place] [address]   Karrie Doffing MD, [place], [place]

## 2011-02-02 NOTE — Letter (Signed)
November 23, 2007    Karrie Doffing, M.D.  [facility]  [address]  [place], [place]  [address]   RE:  [name], [name]  MRN:  [id number]  /  DOB:  January 01, 1961   Dear Dr. Illene Regulus:   [name] returns to the office for continued assessment and  treatment of conduction system disease, mild aortic stenosis and a  variety of other medical issues.  He was seen in the emergency  department approximately 5 weeks ago for a fairly severe episode of  epistaxis.  He was mildly anemic at that time with a hemoglobin of 10.8  and normal MCV.  Control of bleeding was eventually established, and he  has had no problems since.  Diabetic control has been so good that he no  longer requires insulin, but his last A1c level was 6.8.  He reports no  chest pain nor dyspnea.  He has been losing weight with caloric  restrictions.   CURRENT CARDIAC MEDICATIONS:  1. Lovastatin 40 mg daily.  2. Aspirin 81 mg daily.  3. Atenolol 50 mg daily.  4. Enalapril 5 mg daily.   PHYSICAL EXAMINATION:  [name] in no acute distress.  The  weight is 211, 10 pounds less than in August of last year.  Blood  pressure 110/80, heart rate 60 and regular, respirations 18.  NECK:  No jugular venous distention; normal carotid upstrokes with  transmitted murmur bilaterally.  LUNGS:  Clear.  CARDIAC:  Normal first heart sounds; preserved aortic component of the  second heart sound; grade 3/6 early peaking systolic ejection murmur at  the cardiac base.  ABDOMEN:  Soft and nontender; no organomegaly.  EXTREMITIES:  Ichthyosis; no edema.   IMPRESSION:  [name] is doing superbly on his current medications.  We will renew those as needed.  He is encouraged to continue losing  weight, and his wife is challenged to do so as well.  He apparently  requires urologic intervention for a urethral stricture.  He should be  able to tolerate Nicholas from a cardiac standpoint without difficulty.  It  is nice to see [name] doing so well.  I will plan see him again  in 8 months.  Vaccinations are up-to-date.  A CBC will be assessed to  verify that anemia has not worsened.    Sincerely,      Gerrit Friends. Dietrich Pates, MD, [facility]  Electronically Signed    RMR/MedQ  DD: 11/23/2007  DT: 11/23/2007  Job #: [id number]

## 2011-02-02 NOTE — Consult Note (Signed)
NAME:  SHAHAB, POLHAMUS NO.:  [id number]   MEDICAL RECORD NO.:  [id number]          PATIENT TYPE:  INP   LOCATION:  A313                          FACILITY:  [facility]   PHYSICIAN:  Tilford Pillar, MD      DATE OF BIRTH:  1961-08-25   DATE OF CONSULTATION:  12/21/2007  DATE OF DISCHARGE:  12/22/2007                                 CONSULTATION   SURGICAL CONSULTATION NOTE   CHIEF COMPLAINT:  Abdominal pain and pelvic mass.   HISTORY OF PRESENT ILLNESS:  The patient is a 50 year old male with a  history of diabetes mellitus type 2, bradycardia with pacemaker,  obstructive sleep apnea, a history of urinary retention, history of  bilateral orchiectomy and a history of MRDD, who initially presented  with abdominal pain, nausea and emesis.  He has had no similar episodes  in the past but has had prior urinary tract infections and vomiting.  His history:  As the patient is somewhat a poor historian, his history  is obtained from both the patient, the patient's wife who is present at  time of evaluation, as well as reviewing the chart.  Apparently, this  episode began shortly after the evaluation at [facility], for a  urethral dilatation secondary to urethral stricture.  He had a  indwelling Foley catheter in place and was doing well, until one night  when he awoke with increasing abdominal pain which persisted and  progressed to nausea, vomiting, abdominal distention.  He had presented  to the [facility] and was discovered to have urinary retention  at which point the Foley catheter was removed, following a large voiding  episode.  At this point, the majority of the patient's symptoms  improved; however, he was continued to be worked up, at which time a CT  evaluation of the abdomen was obtained, demonstrating the pelvic mass.  The patient has had no history of hematuria, no pyuria, no history of  bowel changes, no melena, no hematochezia.  She has had no  prior history  of GI diseases or disorders.   PAST MEDICAL HISTORY:  1. Diabetes mellitus type 2.  2. Aortic stenosis.  3. Bradycardia with pacemaker placement.  4. Hypertension.  5. Hyperlipidemia.  6. Bilateral orchiectomy.   PAST SURGICAL HISTORY:  1. Bilateral orchiectomy.  2. Tonsillar adenoidectomy.  3. History of sinus surgery.   MEDICATIONS:  The patient is on multiple medications.  These were  reviewed.  Please see the patient's chart for complete listing of them.   ALLERGIES:  HE IS ALLERGIC TO PENICILLIN WHICH CAUSES ANAPHYLACTIC  REACTION.   SOCIAL:  No tobacco use, no alcohol use, no recreational drug use.   PHYSICAL EXAMINATION:  VITALS:  Temperature 96.9, heart rate 56,  respirations 22, blood pressure 115/75.  He is 94% oxygen saturation on  room air. GENERAL:  The patient is sitting in a chair at bedside, in no  acute distress.  He is alert.  He is oriented, although somewhat limited  in verbal communication.  HEENT:  Pupils are equal, round, reactive.  Extraocular movements are  intact.  Trachea is midline.  PULMONARY:  No labored respirations.  He is clear to auscultation.  CARDIOVASCULAR:  Regular rate and rhythm, he had 2+ radial pulse  bilaterally.  ABDOMEN:  Positive bowel sounds.  Abdomen is soft, obese, nondistended,  nontender.  He does have a midline incision scar.  No hernias  appreciated.  No masses are palpable.  EXTREMITIES:  Warm and dry.   LABORATORY AND RADIOGRAPHIC STUDIES:  CBC:  White blood cell count 10.9,  hemoglobin 10.9, hematocrit 33.2, platelets 299.  Basic metabolic panel:  Sodium 135, potassium 4.7, chloride 104, bicarb 24, BUN 18, creatinine  1.15, blood glucose 142, alkaline phosphatase 159.  UA demonstrated  moderate blood and some white blood cell count, rare bacteria.  CT of  the abdomen and pelvis demonstrates no free air or fluid.  There is what  appears to be a tubular mass structure between the rectum and the   bladder, with a questionable association around the area of the  prostate, no stranding or fluids were noted around this area.  No other  abnormalities are noted on the CT evaluation.   ASSESSMENT/PLAN:  Pelvic mass.  At this point, the patient appears to be  asymptomatic from this finding.  Due to the patient's recent procedure  with urethral dilatation, I would suspect this mass to be a idiopathic  etiology, rather than a cancer or acute process.  In reviewing the CT  with the radiologist, my suspicion that this is associated with the  colon is very low, although a future colonoscopy may be warranted.  At  this point, I would recommend a retrograde urethrogram to evaluate the  continuity of the urethra, to ensure that no dissection has recurred  during the prior procedure.  Other possible etiologies could be a  urologic or gastrointestinal __________ due to the patient's history of  congenital abnormalities and undescended testicles. However, this is a  lot less suspicious, and due to the patient's history of evaluation and  workup, I would suspect that this would have already have been noted and  evaluated on a prior examination.  The patient supposedly did have a  recent CT evaluation at [facility], and these records will be  requested.  At this point, I will continue to follow the patient,  although I do not feel that surgical intervention is warranted at this  time and will continue to follow this patient's progress with you.  I  appreciate the opportunity to participate in this patient's care.      Tilford Pillar, MD  Electronically Signed     BZ/MEDQ  D:  12/23/2007  T:  12/23/2007  Job:  [id number]   cc:   Tilford Pillar, MD  Fax: [phone]

## 2011-02-02 NOTE — Assessment & Plan Note (Signed)
NAMEMarland Kitchen  ZECHARIAH, BISSONNETTE               CHART#:  [id number]   DATE:  03/27/2008                       DOB:  1961-09-04   CHIEF COMPLAINT:  Followup of abnormal liver tests.   SUBJECTIVE:  The patient is a 50 year old gentleman who we saw during  the hospitalization in April of this year.  At that time, he presented  with nausea, vomiting, and abdominal pain.  He has had a recent  cystoscopy with urethral dilation.  On CT scan, he had abnormal  attenuation pattern of the liver, not felt to be consistent with fatty  liver or metastatic disease.  He also had a mass in the left pelvis  adjacent to the sigmoid colon with a long stalk originating deep within  the pelvis between the urinary bladder and the rectum.  At that time,  his LFTs were for the most part unremarkable with alkaline phosphatase  mildly elevated at 147 and AST was 46, ALT was 32, and total bilirubin  1.3, his alkphos rose to 189.  His AST of 77, ALT to 54, total bilirubin  1.4.  He had iron studies with iron of 34, saturation 7%, ferritin 29,  hepatitis B surface antigen was negative, hepatitis C antibody was  negative.  Last set of LFTs in May 2009 revealed a total bilirubin of  2.2, indirect 1.5, alk phos 190, AST is 43, ALT 35, and albumin 4.2.  The patient has a ceruloplasm of 60 during hospitalization.   He was last seen at time of EGD and colonoscopy on 02/09/2008.  He had a  small hiatal hernia, patchy antral erosions, tiny scattered narrow mouth  sigmoid diverticula.  Terminal ileum appeared normal.  Biopsies from the  duodenum were negative and biopsies from the stomach revealed chronic  gastritis but no H. pylori.   He presents today stating that he is doing very well.  Since we last saw  him he saw Dr. Caralee Ates, his urologist, and Dr. Leticia Penna for further  evaluation of his pelvic mass.  His wife states that both of these  doctors have recommend that he leave this pelvic mass alone.  They  thought it was nothing  to be worried about.  Since we last saw him, he  has lost 10 pounds.  He states this is intentional.  He denies any  abdominal pain.  He has had a couple of loose stools a day the last  couple of days.  Nothing chronic.  No blood in the stool or melena.  No  heartburn, nausea, or vomiting.   CURRENT MEDICATIONS:  See updated list.   ALLERGIES:  PENICILLIN.   PHYSICAL EXAMINATION:  VITAL SIGNS:  Weight 198, temperature 98.1, blood  pressure 98/78, and pulse 64.  GENERAL:  A pleasant hard of hearing Caucasian male in no acute  distress.  SKIN:  Warm and dry.  No  jaundice.  HEENT:  Sclerae nonicteric.  Oropharyngeal mucosa moist and pink.  No  lesions, erythema, or exudate.  NECK:  No lymphadenopathy or thyromegaly.  CHEST:  Lungs are clear.  CARDIAC:  Regular rate and rhythm.  ABDOMEN:  Positive bowel sounds.  Abdomen is obese, soft, nontender, no  organomegaly, or masses.  No rebound or guarding.  LOWER EXTREMITIES:  No edema.   IMPRESSION:  The patient is a is a 50 year old gentleman  with history of  elevated LFTs and abnormal-appearing Liver on CT.  He also does have  anemia.  Etiology of elevated LFTs unclear at this time.  He is on  lovastatin along with multiple other medications as well.  At this point  in time, we will need to get repeat LFTs as well as hemoglobin to see  where we stand.  With regards to pelvic mass, he has been evaluated by a  general surgeon and neurologist according to the patient and recommend  no further workup or  management of this.  We will leave this up to  those physicians as well as the patient's primary care physician.   PLAN:  1. CBC, LFTs, AMA, ANA, anti-smooth muscle antibody, and Alpha-1      Antitrypsin  2. Further recommendations to follow.       Tana Coast, P.A.  Electronically Signed     Kassie Mends, M.D.  Electronically Signed    LL/MEDQ  D:  03/27/2008  T:  03/28/2008  Job:  [id number]   cc:   Dr. Karrie Doffing

## 2011-02-02 NOTE — Letter (Signed)
May 08, 2007    Karrie Doffing, MD  [facility]  [address]  [place], [place] [address]   RE:  [name], [name]  MRN:  [id number]  /  DOB:  November 12, 1960   Dear Dr. Illene Regulus:   [name] returns to the office for continued assessment and  treatment of cardiac issues including conduction system disease,  previously requiring pacemaker implantation, mitral and aortic valve  disease and chest discomfort. The patient reports rare episodes of upper  right chest pain that last for a number of minutes.  He is sometimes  given nitroglycerin with relief by his wife.  Other times, symptoms pass  spontaneously.  There is associated dyspnea but not diaphoresis.  He has  been placed on continuous oxygen with improvement and dyspnea and  lightheadedness.  He has had no recurrent syncope.   CURRENT MEDICATIONS:  His current medicines are extensive but, for the  most part, unchanged from his previous assessment.  He has taken  Protonix 40 mg daily.  His dose of Metformin has been increased to 1000  mg b.i.d.  His dose of Enalapril has been decreased to 5 mg daily.  Advair has been added to his regime.   PHYSICAL EXAMINATION:  VITAL SIGNS:  The weight is 221, 5 pounds more  than in June.  Blood pressure 100/70, heart rate 68 and regular,  respirations 18.  GENERAL APPEARANCE:  An overweight pleasant gentleman in no acute  distress.  NECK:  No jugular venous distension.  LUNGS:  Decreased breath sounds, otherwise clear.  CARDIOVASCULAR:  Normal first and second heart sounds.  Modest systolic  ejection murmur.  ABDOMEN:  Soft and nontender, no organomegaly.  EXTREMITIES:  There is 1/2+ ankle edema.   IMPRESSION:  [name] is doing generally well.  Chest pain  certainly could represent myocardial ischemia, but he did have normal  coronary arteries at catheterization five years ago.  I believe that  continued use of sublingual nitroglycerin on a  p.r.n. basis without  further testing is appropriate.  Control of hyperlipidemia was good when  last assessed four months ago.  His pacemaker was checked two months ago  and was functioning normally.  Overall, he is doing well.  I will see  him again in six months.    Sincerely,      Gerrit Friends. Dietrich Pates, MD, [facility]  Electronically Signed    RMR/MedQ  DD: 05/08/2007  DT: 05/09/2007  Job #: [id number]   CC:    Kern Reap, MD

## 2011-02-02 NOTE — Letter (Signed)
October 10, 2008    Dr. Illene Regulus   RE:  Nicholas [name], Nicholas [name]  MRN:  [id number]  /  DOB:  1960/12/15   Dear Dr. Illene Regulus:   Nicholas [name] returns as scheduled for continued assessment and  treatment of multiple cardiac issues, most recently chest discomfort.  With adjustment in his medications, his chest pain has resolved.  He  reports decent exercise tolerance.  His hearing is improved with  adjustment of his hearing aid.  Unfortunately, he has continued  evaluation for abnormal liver function tests and has been found to have  hepatitis.  He and his wife were told that Nicholas is not type A, B, or C.  Evaluation is continuing with consideration of referral to [facility].  He reports some fatigue, but has no definite symptoms of his  liver disease.  LFT abnormalities are mild and nonprogressive for the  past 2 years as I look back in my records.  He has had some  lightheadedness that is not clearly orthostatic, but Nicholas does occur  with standing.  Nicholas typically resolves over the course of an hour.  He  has not fallen.   Medications are unchanged from his last visit.   PHYSICAL EXAMINATION:  GENERAL:  Pleasant [name] in no acute  distress.  VITAL SIGNS:  The weight is 199, 4 pounds less than in November, blood  pressure 105/70 without orthostatic change, heart rate 63 and regular,  respirations 14.  NECK:  No jugular venous distention; no carotid bruits.  LUNGS:  Clear.  CARDIAC:  Normal first and second heart sounds; grade 1-2/6 basilar  systolic ejection murmur.  ABDOMEN:  Soft and nontender; no organomegaly.  EXTREMITIES:  Ichthyosis; slight edema.   Last lipid profile was within the past few months and was excellent.   IMPRESSION:  Nicholas [name] is doing well with current medical therapy.  Prescriptions will be renewed as needed.  He requires no testing at  present.  I will reassess Nicholas [name] in 5 months.   Sincerely,    Sincerely,      Gerrit Friends.  Dietrich Pates, MD, [facility]  Electronically Signed    RMR/MedQ  DD: 10/10/2008  DT: 10/11/2008  Job #: [id number]   CC:    R. Roetta Sessions, M.D.

## 2011-02-02 NOTE — Op Note (Signed)
NAME:  [name], [name]              ACCOUNT NO.:  [id number]   MEDICAL RECORD NO.:  [id number]          PATIENT TYPE:  AMB   LOCATION:  DAY                           FACILITY:  [facility]   PHYSICIAN:  R. Roetta Sessions, M.D. DATE OF BIRTH:  Dec 10, 1960   DATE OF PROCEDURE:  07/18/2008  DATE OF DISCHARGE:  07/15/2008                                PROCEDURE NOTE   REQUESTING PHYSICIAN:  R. Roetta Sessions, MD   PRIMARY CARE PHYSICIAN:  Jerilee Field. Illene Regulus, MD, in [place],  [place].   PROCEDURE:  Small bowel Givens capsule study.   INDICATION FOR PROCEDURE:  Unexplained obscured GI bleeding and iron  deficiency anemia.   HISTORY OF PRESENT ILLNESS:  Mr. Reaume is a 50 year old Caucasian  male with history of chronic iron deficiency anemia.  He has had both  colonoscopy and EGD with chronic gastritis and sigmoid diverticula.  Exam was otherwise normal.  There was nothing to explain GI bleeding.  He has had significant drop in hemoglobin from 10.3 down to 9.9 on  June 18, 2008.  He is felt to have obscured GI bleeding and  therefore capsule endoscopy has been requested by Dr. Jena Gauss.   FINDINGS:  First gastric image was at 20 seconds.  Gastric passage time  with 13 minutes.  First duodenal image at 13 minutes 27 seconds.  Visualized portions of the small bowel appeared normal without  stricture, mass, or bleeding evidence.  Small bowel passage time of 3  hours 16 minutes.  First ileocecal valve image at 3 hours 27 minutes and  55 seconds.  First cecal image at 3 hours 29 minutes and 33 seconds.   FINDINGS AND RECOMMENDATIONS:  Normal Givens capsule studies.  There was  no evidence of GI bleeding or mass on this exam.  Nothing to explain  dropping hemoglobin in the setting of chronic IDA.  We would defer  anemia workup back to primary care physician, Dr. Illene Regulus at this  point.  He also apparently has a pelvic mass which is being worked up by  Urology, Dr. Caralee Ates and  Surgery, Dr. Leticia Penna.  We will defer any  further recommendations as far as GI to Dr. Jena Gauss, his primary  gastroenterologist.      Lorenza Burton, N.P.      Jonathon Bellows, M.D.  Electronically Signed    KJ/MEDQ  D:  07/18/2008  T:  07/19/2008  Job:  [id number]   cc:   Karrie Doffing, MD   Sherlynn Stalls  Fax: [phone]   Dr. Tana Conch

## 2011-02-02 NOTE — H&P (Signed)
NAME:  [name], [name]              ACCOUNT NO.:  [id number]   MEDICAL RECORD NO.:  [id number]          PATIENT TYPE:  AMB   LOCATION:  DAY                           FACILITY[facility]  [facility]   PHYSICIAN:  R. Roetta Sessions, M.D. DATE OF BIRTH:  1960-11-15   DATE OF ADMISSION:  DATE OF DISCHARGE:  LH                              HISTORY & PHYSICAL   CHIEF COMPLAINT:  Iron-deficiency anemia, pelvic mass, abnormal liver on  CT, elevated liver enzymes.   [name] is a 50-year gentleman admitted to [facility] a little over a month ago with nausea, vomiting, abdominal  pain.  He recently had urethral dilation done at [facility].  He was admitted to hospitalist service.  Workup included a CT  scan which revealed a left-sided pelvic mass adjacent to the sigmoid  colon on a stalk originating from deep within the pelvis, possibly in  the area of the urinary bladder and rectum.  This appeared to be not  associated with the colon.  He saw the urology service at [facility] and  he saw Dr. Leticia Penna, general surgeon.  It was not felt that any acute  intervention was warranted for this lesion.  He was found to have iron-  binding capacity of 492, serum iron 34, percent saturation 7%.  Clinically he has had no GI bleeding.  It has been several years since  he had a colonoscopy.  He is diabetic.  So far as I know, he has not  been screened for celiac disease.  He does not have any abdominal pain  at this time.  There is no family history of colon cancer in any a first-  degree relatives.  He does have a cousin with colon cancer.   PAST MEDICAL HISTORY:  1. Type 2 diabetes mellitus.  2. Aortic restenosis.  3. Bradycardia with pacemaker.  4. Hyperlipidemia.  5. Hypertension.  6. Status post bilateral orchiectomy for cryptorchism.  7. Tonsillar adenectomy.  8. History of sinus surgery.  9. 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   CURRENT  MEDICATIONS:  Citalopram, Actos, metformin, multivitamin,  glipizide, lovastatin, Protonix 40 mg daily, Allegra 180 mg daily,  enalapril 5 mg daily, atenolol 50 mg daily, albuterol, aspirin, Advair,  nasal spray, Nasal Gel, nitroglycerin p.r.n.   ALLERGIES:  PENICILLIN.   SOCIAL HISTORY:  The patient lives in [place].  No tobacco,  alcohol or drug use.   REVIEW OF SYSTEMS:  Negative for chest pain, dyspnea on exertion.  No  dysuria, increased urinary frequency, fever, chills.  No yellow  jaundice, clay-colored stools, dark-colored urine.   PHYSICAL EXAMINATION:  A 50 year old gentleman accompanied by his wife,  resting comfortably.  Weight 209, height 5 feet 3 inches.  Temperature 97.8, BP 112/82, pulse  68.  SKIN:  Warm and dry.  HEENT:  No scleral icterus.  There is disconjugate gaze (chronic  observation).  Conjunctivae are pink.  CHEST:  Lungs are clear to auscultation.  CARDIAC:  Regular rate and rhythm with a prominent 2-3/6 systolic  ejection murmur  at the upper left sternal border.   ABDOMEN:  Obese.  Positive bowel sounds, soft, nontender.  No obvious  mass or organomegaly.  EXTREMITIES:  Trace lower extremity edema.   IMPRESSION:  [name] is a 50 year old gentleman with a pelvic  mass as described above.  He has iron-deficiency anemia and has a  history of nonspecific elevations in his LFTs and a diffusely  heterogenous-appearing liver on CT.  The pelvic mass has not been  defined.  This could be a sinus tract, could be an undescended testis  although he has a history of resection.  Would be certainly concerned  about this lesion harboring an occult malignancy.  I have reviewed these  films previously with Dr. Abelino Derrick and it was not felt this was  coming from his lower gastrointestinal tract.   RECOMMENDATIONS:  We will proceed with both a diagnostic/screening  colonoscopy again and EGD to further evaluate iron-deficiency anemia.  We will also  go ahead and simply recheck a set of liver enzymes at this  time.  The risks, benefits, alternatives and limitations of this  approach have been reviewed with Mr. Colaizzi and his wife.  All  parties are agreeable.  We will make further recommendations in the very  near future.      Jonathon Bellows, M.D.  Electronically Signed     RMR/MEDQ  D:  01/23/2008  T:  01/23/2008  Job:  [id number]   cc:   Karrie Doffing, MD  [place], [place]   Tilford Pillar, MD  Fax: [phone]   Jonelle Sports. Frazier Richards, M.D.  Fax: [phone]

## 2011-02-02 NOTE — Group Therapy Note (Signed)
NAME:  [name], [name] NO.:  [id number]   MEDICAL RECORD NO.:  [id number]          PATIENT TYPE:  INP   LOCATION:  A313                          FACILITY:  [facility]   PHYSICIAN:  Osvaldo Shipper, MD     DATE OF BIRTH:  06-Sep-1961   DATE OF PROCEDURE:  12/19/2007  DATE OF DISCHARGE:                                 PROGRESS NOTE   SUBJECTIVELY:  The patient is feeling a little bit better today.  Still  has pain in his lower abdomen but is better.  Still has some burning  sensation when he passes urine.   OBJECTIVE:  Has been afebrile, heart rate is normal.  His blood pressure  is normal, saturating well.  CBG is running well.  His lungs are clear to auscultation.  Cardiovascular exam is normal with a systolic murmur.  ABDOMEN:  Still reveals some mild tenderness in the suprapubic area but  nothing of concern at this point.   His labs show that his white count is down to 15,000 and his hemoglobin  is 11.3, platelet count 356,000.  The renal function has improved, BUN  is 23, creatinine is 1.53.  Potassium has improved from 5.2 yesterday.  Urine cultures are pending.   ASSESSMENT/PLAN:  1. Abdominal pain secondary to urinary retention because of Foley      catheter.  He is status post recent urological procedure a couple      of weeks ago done at [facility].  He had ureteral      stricture which was dilated, apparently.  He came in with abdominal      pain and the Foley was taken out.  His pain got relieved and he      passed about 400 mL of cloudy urine.  He is on antibiotics for      treatment of UTI.  He had significant leukocytosis, which is      improving.  So, overall the patient is improving at this time.  2. He has a history of bilateral orchiectomy for undescended testes.  3. He has a history of hypertension which is stable.  4. History of sleep apnea, chronic obstructive pulmonary disease, all      of which are stable.  5. He has history of  type 2 diabetes which is also stable.  6. He is on metformin at home, which we are holding because of his      renal insufficiency.  This can be initiated when his kidney      function improves.  He was also on Bactrim, which also should be      discontinued at this point.  We are continuing with the Levaquin at      this time.  Dr. Jerre Simon saw him and recommended the CAT scan with      contrast, which is in progress at this time.  We will follow on the      results of this.   Other issues are all stable.  So, I anticipate patient going home very  soon, possibly even tomorrow, April 1 if the  CAT scan looks okay and he  continues to improve.  The patient is extremely hard of hearing.  He  also, I think, has some kind of cognitive deficit.  His wife helps with  communicating with the patient.      Osvaldo Shipper, MD  Electronically Signed     GK/MEDQ  D:  12/19/2007  T:  12/19/2007  Job:  [id number]

## 2011-02-02 NOTE — Discharge Summary (Signed)
[name], [name] NO.:  [id number]   MEDICAL RECORD NO.:  [id number]          PATIENT TYPE:  INP   LOCATION:  2021                         FACILITY:  [facility]   PHYSICIAN:  Gerrit Friends. Dietrich Pates, MD, FACCDATE OF BIRTH:  1961-02-02   DATE OF ADMISSION:  06/14/2008  DATE OF DISCHARGE:  06/16/2008                               DISCHARGE SUMMARY   PRIMARY CARDIOLOGIST:  Gerrit Friends. Dietrich Pates, MD, [facility]   PRIMARY CARE PHYSICIAN:  Arlyss Queen, MD   PROCEDURES PERFORMED DURING HOSPITALIZATION:  Cardiac catheterization  completed by Dr. Tonny Bollman on June 14, 2008 revealing no  significant CAD, normal LVEF of 55% with no mitral regurgitation.   FINAL DISCHARGE DIAGNOSES:  1. Noncardiac chest pain.  2. Hypertension.  3. Hyperlipidemia.  4. Obstructive sleep apnea.  5. Obesity.  6. Deafness.  7. Type 2 diabetes mellitus.  8. Conduction system disease status post [employer] pacemaker placed by      Dr. Ladona Ridgel.   HOSPITAL COURSE:  This is a 50 year old Caucasian male who was seen at  [facility] for cardiac catheterization after being seen by Dr.  Nona Dell on consultation secondary to recurrent chest discomfort.  The patient had had a prior cardiac catheterization in the past which  was negative, however, the patient had been taking a good bit of  nitroglycerin and with recurrent complaints of chest pain.  He was seen  initially at [facility] on June 13, 2008 by Dr. Diona Browner  and felt that it would probably be best to transfer the patient for  repeat cardiac catheterization because of anterior T-wave inversion and  continuing chest discomfort.  [name] troponins were mildly  elevated, although not significant enough to confirm an MI.   The patient did undergo cardiac catheterization on June 14, 2008 by  Dr. Tonny Bollman revealing no significant CAD.  The patient did have  some right groin bleeding post  catheterization and then recurrent  bleeding the following day requiring longer amounts of pressure on the  groin to stop the bleeding along with injection of lidocaine and  epinephrine per Dr. Graciela Husbands on June 15, 2008.  The patient was due to  go home on the June 15, 2008, but secondary to recurrent bleeding  the patient was kept on bedrest and monitored overnight with followup  CBC in the morning to evaluate for any significant bleeding.  The  patient tolerated bedrest without any further complaints.  The patient  was without complaints of pain during hospitalization.  Post-  catheterization, the patient was started on Norvasc 2.5 mg once a day.  On discharge, the patient was evaluated by Dr. Myra Gianotti secondary to  recurrent bleeding as stated previously and was to be observed only with  no intervention necessary.   DISCHARGE LABORATORY DATA:  PTT 40.  Hemoglobin 10.8, hematocrit 33.8,  white blood cells 9.3, and platelets 272.  TSH 5.836, cholesterol 119,  triglycerides 83, HDL 33, and LDL 69.  Sodium 138, potassium 4.5,  chloride 104, CO2 24, glucose 115, BUN 16, and creatinine 1.01.  D-dimer  0.32.  Chest x-ray on discharge, mild cardiomegaly without failure,  unchanged pacemaker.   DISCHARGE MEDICATIONS:  1. Centrum Silver 1 tablet daily.  2. Aspirin 81 mg daily.  3. Metformin 1000 mg twice a day.  4. Actos 45 mg daily.  5. Citalopram 20 mg at bedtime.  6. Protonix 40 mg daily.  7. Fexofenadine 180 mg daily.  8. Atenolol 50 mg daily.  9. Albuterol nebulizer 4 times a day.  10.Advair 5/500 twice a day.  11.Nitroglycerin p.r.n.  12.Norvasc 2.5 mg daily.   FOLLOWUP PLANS AND APPOINTMENTS:  1. The patient is to follow up with Dr. McDonald Bing next week for      post-catheterization check and continuation of evaluation.  2. The patient is to follow up with his primary care physician for      continued medical management.      a.     The patient did have an elevated  TSH and will need to be       addressed further as an outpatient for need for medication and       treatment at primary care physician's discretion.  3. The patient has been given post-cardiac catheterization      instructions with particular emphasis on the right groin site for      recurrence of bleeding, signs of infection, hematoma, or severe      pain.   TIME SPENT:  With the patient to include physician time is 35 minutes.      Bettey Mare. Lyman Bishop, NP      Gerrit Friends. Dietrich Pates, MD, [facility]  Electronically Signed    KML/MEDQ  D:  06/16/2008  T:  06/16/2008  Job:  [id number]   cc:   Arlyss Queen, M.D.

## 2011-02-02 NOTE — Assessment & Plan Note (Signed)
[facility]                       [place] OFFICE NOTE   [name], [name]                     MRN:          [id number]  DATE:06/21/2008                            DOB:          08/20/1961    PRIMARY CARE PHYSICIAN:  Dr. Karrie Doffing.   CARDIOLOGIST:  Gerrit Friends. Dietrich Pates, MD, [facility]   REASON FOR VISIT:  Posthospitalization followup.   HISTORY OF PRESENT ILLNESS:  [name] is a 50 year old male patient  with a history of symptomatic bradycardia status post pacemaker  implantation as well as hypertension, hyperlipidemia, diabetes mellitus,  and sleep apnea.  The patient recently presented to [facility]  with complaints of chest pain.  His enzymes were marginally elevated  concerning for acute coronary syndrome.  He was transferred to [facility] for cardiac catheterization which revealed normal coronary  arteries and normal LV function.  He had some problems with right  femoral arteriotomy site bleeding.  Postcatheterization, he was observed  for a couple more days.  He actually had epinephrine injected as well as  superficial stitches placed.  He also was seen briefly by the vascular  surgeon who felt that continued monitoring was warranted.  The patient  presents for followup today.  He overall is feeling weak since his  hospitalization.  He continues to have occasional chest tightness.  Norvasc was added to his medical regimen in the hospital.  This seems to  be helping.  He also notes increased wheezing at times that contributes  to chest tightness.  His p.r.n. inhaler has helped this as well.  He  denies syncope or near syncope.  Denies orthopnea, PND, or pedal edema.   MEDICATIONS:  Actos 45 mg daily, Aspirin 81 mg daily, Atenolol 50 mg  daily, Allegra 108 mg daily, Multivitamin daily, Protonix 40 mg daily,  Advair b.i.d., Metformin 1 g b.i.d.,  Enalapril 5 mg daily, Celexa 20 mg daily, Levoxyl 0.25 mg  daily,  Amlodipine 2.5 mg daily, Albuterol p.r.n., Nitroglycerin p.r.n.   PHYSICAL EXAMINATION:  GENERAL:  He is a well-nourished, well-developed  male in no acute distress.  VITAL SIGNS:  Blood pressure is 108/76, pulse 64, weight 195 pounds.  HEENT:  Normal.  NECK:  Without JVD.  CARDIAC:  Normal S1 and S2.  Regular rate and rhythm, 1-2/6 systolic  ejection heard best along the left sternal border.  LUNGS:  Clear to auscultation bilaterally with decreased breath sounds.  ABDOMEN:  Soft, nontender.  EXTREMITIES:  With trace edema bilaterally.  VASCULAR:  Right femoral arteriotomy site without hematoma or bruit.  There is a superficial stitch noted.  This was removed in the office  under sterile technique.  No hematoma or bruit noted.  He does have a  large area of ecchymosis.   ASSESSMENT AND PLAN:  1. Noncardiac chest pain in a 50 year old male patient with a recent      history of cardiac catheterization demonstrating normal coronary      arteries and good left ventricular function.  He was placed on      Norvasc in the hospital.  It is certainly  possible he may have      microvascular disease contributing to his symptoms as he is a      diabetic.  We will continue on amlodipine therapy for now.  His      groin site seems to be stable.  There has been no further bleeding.      We will try to obtain recent labs obtained by [name] primary care      physician.  If no CBC is drawn, we will obtain that.  2. Symptomatic bradycardia status post pacemaker implantation.  He      will continue to follow up with Dr. Ladona Ridgel as indicated.  3. History of elevated liver function tests.  He will continue to      follow up with Dr. Jena Gauss.  4. Diabetes mellitus.  He will continue follow up with his primary      care physician.  5. Hypertension.  This is overall well controlled.  No medication      changes will be made today.  6. Dyslipidemia.  He is no longer on lovastatin.  This was       discontinued secondary to his elevated liver function tests.  7. Murmur.  On echocardiogram in January 2008, he had moderate aortic      sclerosis and minimal aortic stenosis.  No further workup at this      time.   DISPOSITION:  The patient will be brought back to follow up with Dr.  Dietrich Pates in 1 month or sooner p.r.n..  Of note, we have given a note to  return to work today.       Tereso Newcomer, PA-C  Electronically Signed      Gerrit Friends. Dietrich Pates, MD, [facility]  Electronically Signed   SW/MedQ  DD: 06/21/2008  DT: 06/21/2008  Job #: [id number]   cc:   Karrie Doffing, MD

## 2011-02-02 NOTE — Assessment & Plan Note (Signed)
NAMEMarland Kitchen  [name], [name]               CHART#:  [id number]   DATE:  06/05/2008                       DOB:  [birth year]   CHIEF COMPLAINT:  Followup of elevated alkaline phosphatase.   PROBLEM LIST:  1. Elevated LFTs with abnormal appearing liver on CT, not felt to be      consistent with fatty liver or metastatic disease.  He had abnormal      attenuation pattern of the liver on CT.  He has had increasing      alkaline phosphatase over the last 6 months' initially at 147 and      now up to 383.  GGT has been in the 700s.  Transaminases have been      intermittently elevated and he has been on chronic lovastatin      therapy.  Lovastatin was stopped yesterday.  Workup has included      negative hepatitis B surface antigen, negative hepatitis C      antibody, ferritin normal at 29, ANA negative, AMA negative,      antismooth muscle antibody negative, ceruloplasmin, and alpha 1-      antitrypsin levels normal.  2. EGD and colonoscopy on Feb 09, 2008, revealed small hiatal hernia,      patchy antral erosions, tiny scattered narrow-mouthed sigmoid      diverticulum.  Terminal ileum was normal.  Biopsies from the      duodenum were negative and biopsies from the stomach revealed      chronic gastritis, but no H. pylori.  3. History of anemia.  Most recent hemoglobin we have from April 02, 2008, hemoglobin was 10.3.  The patient reports recent stool      hemoccult to be negative.  4. History of pelvic mass, being evaluated by Dr. Leticia [name] and his      urologist Dr. Caralee [name] who according to the patient's family, they      believe there needs to be nothing done about this pelvic mass.  5. Type 2 diabetes mellitus.  6. Aortic stenosis.  7. Pacemaker for bradycardia.  8. Mental deficits and hard of hearing.   SUBJECTIVE:  The patient is back again for further evaluation of  elevated alkaline phosphatase and transaminases.  He was actually re-  referred by Dr. Illene [name], however, we really had  not finished with  ongoing work up, which was started back earlier in the summer.  He has  been having repeat labs regarding his elevated LFTs.  His last set was  done on May 22, 2008.  His alk phos was up to 383, and its highest  to this date.  His AST is up again at 78, ALT 77.  He has had extensive  evaluation as above.  He also has anemia with a declining hemoglobin  over the last 6 months.  I do not know what his current hemoglobin is,  however.  He apparently was seen in [facility] recently and he  states he was told that he had no blood in his stool and his anemia was  not significant.  I am not sure how much of this is true, however.  He  denies any blood in the stool or melena.  He denies any abdominal pain.  His appetite is good according to his wife.  Overall,  he feels well.  He  is on oxygen 2 L daily and he states that his breathing has been stable.  He carries a history of COPD and is followed by Dr. Egbert [name] in [place],  [place].  He stopped his lovastatin yesterday per Dr. Dietrich [name].   CURRENT MEDICATIONS:  See updated list.   ALLERGIES:  Penicillin.   PHYSICAL EXAMINATION:  VITAL SIGNS:  Weight 204, height 5 feet 3 inches,  temp 97.9, blood pressure 90/60, and pulse 64.  GENERAL:  A pleasant, elderly, hard of hearing Caucasian gentleman, in  no acute distress.  SKIN:  Warm and dry and no jaundice.  HEENT:  Sclerae nonicteric.  Oropharyngeal mucosa moist and pink.  CHEST:  Lungs are clear to auscultation.  CARDIOVASCULAR:  Regular rate and rhythm.  ABDOMEN:  Positive bowel sounds.  Abdomen is soft, obese, nontender, and  nondistended.  No organomegaly or masses.  LOWER EXTREMITIES:  No edema.   IMPRESSION:  Persistently, elevated alkaline phosphatase, which is  actually increasing.  His transaminases are again now abnormal.  He has  had multiple studies as outlined above, which has been negative.  He  does have an abnormal-appearing liver on the CT.  I feel  we are again  close to the point of needing a liver biopsy, however, we would like to  see where his LFTs are after being off his lovastatin for a couple of  weeks.  In addition, he does have a history of iron deficiency anemia.  He denies any overt GI bleeding.  Hemoglobin has had a steady decline  over the last 6 months.  We need to see where we stand at this point.   PLAN:  1. Recheck LFTs and CBC in 2 weeks.  Will also check quantitative      immunoglobulins.  2. If his LFTs remain elevated, he will likely need a liver biopsy in      the very near future.       [name], P.A.  Electronically Signed     R. Roetta Sessions, M.D.  Electronically Signed    LL/MEDQ  D:  06/05/2008  T:  06/06/2008  Job:  [id number]   cc:   Karrie Doffing, MD  Gerrit Friends. Nicholas Pates, MD, [facility]

## 2011-02-02 NOTE — Consult Note (Signed)
NAME:  [name], [name] NO.:  [id number]   MEDICAL RECORD NO.:  [id number]          PATIENT TYPE:  INP   LOCATION:  A321                          FACILITY:  [facility]   PHYSICIAN:  Jonelle Sidle, MD DATE OF BIRTH:  01/22/1961   DATE OF CONSULTATION:  DATE OF DISCHARGE:                                 CONSULTATION   REQUESTING PHYSICIAN:  Rhae Lerner. Margretta Ditty, M.D.   PRIMARY CARE PHYSICIAN:  Dr. Karrie Doffing.   PRIMARY CARDIOLOGIST:  Dr. Tabor Bing.   REASON FOR CONSULTATION:  Chest pain.   HISTORY OF PRESENT ILLNESS:  [name] is a 50 year old male  recently seen on June 04, 2008 by Dr. Dietrich Pates for a routine office  visit.  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 Ladona Ridgel.  He  had a device interrogation back in July and was noted to be paced in the  atrium 36% of the time.  His planned device followup was to be 6 months  from that time.  Additional problems include hypertension,  hyperlipidemia, obstructive sleep apnea, obesity, deafness and type 2  diabetes mellitus.  He has had a previous history of chest pain and  underwent a cardiac catheterization back in 2002 which is reportedly  normal based on available information.  He was actually seen in the  emergency department back in June with chest pain.  In speaking with the  patient and his family member, he does use sublingual nitroglycerin;  and, in fact, 2 times since he saw Dr. Dietrich Pates recently.  This seems to  alleviate his symptoms.  He was at his typical job earlier today without  his nitroglycerin and began to experience chest pain, ultimately  presenting to the emergency department for further assessment.  In  reviewing the chart further, I see that he has been evaluated by Dr.  Jena Gauss with abnormal liver function tests and the possibility of  cirrhosis has been considered.  As far as appetite, Mr.  [name] is  having no difficulty eating and has had no major bowel changes or  abdominal pain.  His evaluation in the emergency department included lab  work detailing a normal white blood cell count, hemoglobin of 11.2,  platelets of 206, BUN 21, creatinine 1.1, mildly elevated AST of 53 and  an ALT of 53, with alkaline phosphatase of 320 and total bilirubin of  2.1.  Cardiac markers were also sent and abnormal with a total CK of  506, CK-MB of 30 with an increased relative index of 4.1 with normal  troponin I of 0.01.  BNP level was normal at 88.1.  An EKG done today  shows sinus rhythm with a PR interval of 238 milliseconds and  nonspecific ST-T wave changes.  A tracing back in June (possibly a  magnet tracing) showed an electronic ventricular paced rhythm at 74  beats per minute.  No other tracings for comparison at this time.  We  have been asked to evaluate the patient further.  He is being admitted  to the [facility] Team.  ALLERGIES:  PENICILLIN.   MEDICATIONS:  As of his recent visit in September included insulin 70/30  sliding scale, metformin 2 grams daily, glipizide 10 mg daily, Actos 45  mg daily, lovastatin 40 mg daily, aspirin 81 mg daily, enalapril 5 mg  daily, atenolol 50 mg daily, Allegra 180 mg daily, Lexapro 10 mg daily,  Protonix 40 mg daily, Advair 1 puff b.i.d., Celexa 20 mg daily,  albuterol p.r.n., sublingual nitroglycerin p.r.n.   PAST MEDICAL HISTORY:  Is detailed in the history of present illness.  He does have a history of anemia with previous epistaxis, also status  post orchiectomy greater than 20 years ago.  There is a history of  reflux disease with esophageal stricture and echocardiography in the  past that demonstrated mild mitral regurgitation as well as mild aortic  stenosis.   SOCIAL HISTORY:  The patient lives in [place].  He has family  member support.  Denies any tobacco or alcohol use.   FAMILY HISTORY:  Was reviewed,  noncontributory at this point.   REVIEW OF SYSTEMS:  The patient denies any cough, fevers or chills.  Breathing has been stable.  Has had no change in appetite.  No melena or  hematochezia.  Does have some chronic degree of lower extremity  swelling, typically mild.  No palpitations or syncope.   On examination blood pressure 110/80, heart rate is in the 70s,  respirations nonlabored.  Patient afebrile.  A short-statured overweight male in no acute distress, wearing hearing  aids.  HEENT:  Conjunctivae are normal.  Pharynx clear.  Poor dentition.  NECK:  Increased girth.  Unable to adequately assess for jugular venous  pressure.  No loud bruits and no thyromegaly noted.  LUNGS:  Essentially clear with diminished breath sounds.  CARDIAC:  Exam reveals distant regular heart sounds with a soft systolic  murmur heard best at the base.  Second heart sound is preserved.  No  pericardial rub or S3 gallop.  ABDOMEN:  Obese, nontender, and no guarding noted.  Bowel sounds are  present.  EXTREMITIES:  Exhibit trace edema below the knees.  Does have some  apparent venous stasis, 1 to 2+ distal pulses.  MUSCULOSKELETAL:  No kyphosis noted.  NEUROPSYCHIATRIC:  The patient is alert and oriented x3.   LABORATORY DATA:  WBCs 8.4, hemoglobin 11.2, platelets 206.  Sodium 140,  potassium 4.6, glucose 142, BUN 21, creatinine 1.1.   Chest x-ray from February 27, 2008 showed cardiomegaly with pacemaker lead  projecting over the right atrium and right ventricle.  No follow-up film  as yet.   IMPRESSION:  1. Chest pain syndrome with some apparent responsiveness to      nitroglycerin.  Initial cardiac markers are mildly abnormal in the      absence of active chest pain.  His electrocardiogram is also      nonspecific at this time.  I see a baseline history of normal      cardiac catheterization from 2002.  I am not certain that he has      had any follow-up ischemic testing since that time.  2. History of  conduction system disease with symptomatic bradycardia,      status post Medtronic pacemaker placement by Dr. Ladona Ridgel originally      in 1995.  The device was followed/interrogated in July of this      year.  3. Hypertension.  4. Obesity with obstructive sleep apnea.  5. Hyperlipidemia.  6. Type 2 diabetes mellitus.  RECOMMENDATIONS:  Agree with admission for further observation.  Mr.  Teaster is being admitted to the [facility] Team on the telemetry  floor.  At this point, would cycle cardiac markers and follow his  electrocardiogram.  His non paced tracing shows no acute ST-T wave  changes.  Would continue aspirin, ACE  inhibitor and beta-blocker therapy.  Nitroglycerin paste has also been  added in the emergency department.  Would consider anticoagulation if  his abnormal cardiac trend continues upward.  Depending on his clinical  progress and subsequent objective data, our service can determine the  next step in his evaluation from an ischemic perspective.      Jonelle Sidle, MD  Electronically Signed     SGM/MEDQ  D:  06/13/2008  T:  06/13/2008  Job:  [id number]   cc:   Rhae Lerner. Margretta Ditty, M.D.  [address]  [place]  [place] [address]   Gerrit Friends. Dietrich Pates, MD, [facility]  [address]  [place], [place] [address]   Karrie Doffing, MD

## 2011-02-02 NOTE — Op Note (Signed)
NAME:  [name], [name]              ACCOUNT NO.:  [id number]   MEDICAL RECORD NO.:  [id number]          PATIENT TYPE:  AMB   LOCATION:  DAY                           FACILITY:  [facility]   PHYSICIAN:  R. Roetta Sessions, M.D. DATE OF BIRTH:  June 02, 1961   DATE OF PROCEDURE:  DATE OF DISCHARGE:                               OPERATIVE REPORT   INDICATIONS FOR PROCEDURE:  A 50 year old gentleman with iron-deficiency  anemia.  He is noted to have a left sided pelvic mass adjacent to the  sigmoid colon on CT.  There is a history of orchiectomy.  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  EGD and colonoscopy now being done primarily to further  evaluate iron-deficiency anemia and confirm that the lesion in the left  pelvis is not associated with the colon.  This approach was discussed  with the patient at length.  Risks, benefits, alternatives, and  limitations have been reviewed previously.  Questions were answered.  He  is agreeable.   PROCEDURE NOTE:  O2 saturation, blood pressure, and pulse of the patient  monitored throughout the entire procedure. Conscious sedation, Versed 4  mg IV and Demerol 100 g IV in divided doses. Cetacaine spray for topical  pharyngeal anesthesia.   INSTRUMENTATION:  Pentax video chip system.   FINDINGS:  EGD examination of the tubular esophagus revealed no mucosal  abnormalities.  EG junction easily traversed and into his stomach.  Gastric cavity was emptied, and insufflated well with air. Thorough  examination of the gastric mucosa including retroflexed view of the  proximal stomach esophagogastric junction demonstrated only a small  hiatal hernia and multiple areas of antral erosions, some good 1 x 1-cm  in dimension.  There was no ulcer.  There was no infiltrating process.  The pylorus was patent and easily traversed.  Examination of the bulb  second and third portion revealed grossly normal small bowel mucosa.  Therapeutic/diagnostic maneuvers performed.  Biopsies of the second and  third portion of the duodenum were taken to rule out occult celiac  disease.  The patient tolerated the procedure well and was prepared for  colonoscopy.  Digital rectal examination revealed no abnormalities.  Endoscopic findings: The prep was adequate. Colon: Colonic mucosa was  surveyed from the rectosigmoid junction through the left transverse,  right colon, appendiceal orifice, ileocecal valve, and cecum.  Terminal  ileum was intubated 10 cm from this level.  Scope was slowly withdrawn.  All previously mentioned mucosal surfaces were again seen.  The patient  has not had scattered tiny mouth sigmoid diverticula.  The remainder of  the colonic mucosa appeared normal and was unable to identify any  evidence of neoplasia, fistular, or sinus tracts etc otherwise.  The  scope was pulled down to the rectum with examination of rectal mucosa,  including retroflexed view of the anal verge, demonstrated  no  abnormalities.  The patient tolerated both procedures well and was  reacted in Endoscopy.   IMPRESSION:  1. EGD.  Normal esophagus, small hiatal hernia, patchy antral erosions      as  described above, otherwise normal stomach, patent pylorus,      normal D1 and D3.  Biopsies of both D2 and D3 were taken as well as      the gastric mucosa (latter not mentioned above).  2. Colonoscopy findings normal rectum, tiny scattered narrow mouth      sigmoid diverticula, colonic mucosa and terminal ileal mucosa      appeared normal.   RECOMMENDATIONS:  1. We will follow up on path.  If he has H pylori, we will certainly      treat.  We will assess small bowel biopsy and see if there is any      evidence celiac disease.  However, he still has a pelvic mass,      which has not been defined and I feel this mass needs to be      removed.  He is seen Dr. Leticia Penna previously.  We will make plans      for him to be set up to see Dr.  Leticia Penna once again for      consideration of surgical resection, which I again think needs to      be done.      Jonathon Bellows, M.D.  Electronically Signed     RMR/MEDQ  D:  02/09/2008  T:  02/09/2008  Job:  [id number]   cc:   Dr Karrie Doffing  [facility]   Tilford Pillar, MD  Fax: [phone]   Jonelle Sports. Frazier Richards, M.D.  Fax: [phone]

## 2011-02-02 NOTE — H&P (Signed)
NAME:  [name], [name] NO.:  [id number]   MEDICAL RECORD NO.:  [id number]          PATIENT TYPE:  EMS   LOCATION:  ED                            FACILITY:  [facility]   PHYSICIAN:  Osvaldo Shipper, MD     DATE OF BIRTH:  Feb 11, 1961   DATE OF ADMISSION:  12/18/2007  DATE OF DISCHARGE:  LH                              HISTORY & PHYSICAL   PRIMARY MEDICAL DOCTOR:  Dr. Illene Regulus, in [place], [place].  She is also followed by Dr. Dietrich Pates as cardiologist.   His urologist is a Dr. Caralee Ates located in [facility].   ADMITTING DIAGNOSES:  1. Urinary tract infection with leukocytosis.  2. Recent urological procedure at [facility].  3. Mild hyperkalemia with mild acute renal failure.  4. Extremely hard of hearing.  5. History of diabetes, on oral hypoglycemic agents.  6. History of hypertension.  7. History of obstructive sleep apnea.   CHIEF COMPLAINT:  Nausea, vomiting, abdominal pain since this morning.   HISTORY OF PRESENT ILLNESS:  The patient is a 50 year old Caucasian male  who has a past medical history of some kind of possible mental handicap,  a history of her diabetes, hypertension, pacemaker placement, sleep  apnea, dyslipidemia, hypertension, who about 3 weeks ago experienced  urinary retention as a result of with a Foley catheter was placed.  Then, about 2 weeks ago he had an procedure done at [facility] by Dr. Tana Conch which involved opening of a urethral  stricture.  No further details available at this point.  The patient was  discharged after this procedure with a Foley catheter left in place, and  the plans were to have this Foley removed tomorrow, that is March 31st.  However, this morning, the patient woke up complaining of nausea, and  then he had a few episodes of emesis.  He was having lower abdominal  pain, denied any fever or chills.  The pain was an aching pain in the  lower abdomen.  The  pain was about 9/10 in intensity.  He came into the  emergency department here for further evaluation, after his wife called  the PMD and the urologist.  It should be noted that most of the history  was obtained from the patient's wife, as the patient is extremely hard  of hearing and is not able to respond most of the time.   In the emergency department, his Foley catheter was removed, after which  he passed about 400 mL of urine, and then his symptoms resolved.  We  will call for admission, because of significant leukocytosis.   MEDICATIONS AT HOME:  1. Metformin ER 1000 mg b.i.d.  2. Protonix 40 mg daily.  3. Atenolol 50 mg daily.  4. Enalapril 5 mg daily.  5. Lovastatin 40 mg daily.  6. Actos 45 mg daily.  7. Citalopram 20 mg at bedtime.  8. Multivitamins 1 tablet daily.  9. Advair Diskus twice daily.  10.Aspirin 81 mg daily.  11.Albuterol neb inhaler as needed.  12.Nitroglycerin sublingually as needed.  13.Oxycodone acetaminophen 1-2  tablets as needed.  14.Fexofenadine 180 mg once a day.  15.Bactrim DS he has been taking for the past 2 weeks, ever since this      procedure.  16.He is on oxygen at 2 liters per minute by nasal cannula.  He wears      BiPAP at night time for his sleep apnea, with a setting of 16 x 11,      backup to be  18 and O2 at 3 liters per minute.   ALLERGIES:  INCLUDE PENICILLIN WHICH CAUSES ANAPHYLAXIS.  SHE TELLS ME  THAT ALL FORMS OF PENICILLIN CAUSES A PROBLEM.   PAST MEDICAL HISTORY:  Is positive for aortic stenosis, type 2 diabetes.  He was on insulin recently but was taken off by his PMD.  Dyslipidemia,  hypertension, pacemaker placement for significant bradycardia many years  ago, obstructive sleep apnea.  He apparently had a cardiac cath about 5  years ago which did not show any significant coronary artery disease.  He has had sinus surgery, tonsillectomy, and he has had bilateral  orchiectomy for unclear reasons.   SOCIAL HISTORY:  He  lives with his wife, does not smoke, was never a  smoker.  No alcohol consumption, currently.  No illicit drug use.  He is  able to ambulate by himself.   FAMILY HISTORY:  Positive for COPD, diabetes and hypertension and also  history of heart disease.   REVIEW OF SYSTEMS:  GENERAL:  System positive for weakness.  HEENT:  Unremarkable.  CARDIOVASCULAR:  Unremarkable, and the rest of the system  was difficult to do, because of his extreme hard of hearing.   PHYSICAL EXAMINATION:  VITAL SIGNS: Temperature 97.4, blood pressure  132/67, heart rate 82, respiratory rate 20, saturation 95% on room air.  GENERAL:  Exam is an obese white male in no distress.  HEENT:  There is no pallor, no icterus.  Oral mucous membranes moist.  No oral lesions are noted.  NECK:  Soft and supple.  No thyromegaly is appreciated.  LUNGS:  Clear to auscultation bilaterally.  No wheezing, rales or  rhonchi.  CARDIOVASCULAR:  S1, S2 is normal, regular systolic murmur appreciated  at the apex and in the aortic area.  ABDOMEN:  Soft.  There is mild tenderness in the suprapubic region,  otherwise mostly benign.  No rebound rigidity present.  Bowel sounds are  present.  EXTREMITIES:  Show skin changes but no calf tenderness.  Her peripheral  pulses are palpable.  NEUROLOGICAL:  The patient is extremely hard of hearing but seems to be  alert, oriented, at least to person, place.  No focal neurological  deficits are evident.   LABORATORY DATA:  His white count is 23,000, hemoglobin is 12.1,  hematocrit 36, MCV 79, platelet counts 464.  He has 91% neutrophils.  His sodium was 130, potassium 5.2, chloride is 98, bicarb is 21, glucose  362, BUN is 30, creatinine is 1.9.  His total bilirubin is 1.3, alk phos  is 147, AST is 46.  His UA showed a specific gravity more than 1.030,  large blood, trace leukocytes, 3-6 WBCs, many bacteria, 21-50 RBCs.  He  had CT scan of his abdomen and pelvis, which showed a right renal   scarring, nonspecific retroperitoneal stranding, mild inflammatory  changes adjacent to the urinary bladder, nothing else acute.   ASSESSMENT:  This is a 50 year old Caucasian male with history as stated  above, who presents with a 1-day history of nausea, vomiting and  abdominal pain.  I think these symptoms are related to his Foley  catheter.  Probably mis-positioned.  When the Foley was removed, he  produced 400 mL of foul-smelling urine, and this relieved his pain.  He  has evidence for urine infection.  He has significant leukocytosis.  He  is also dehydrated with mild acute renal failure and hyperkalemia.   PLAN:  1. Urinary tract infection.  We will treat him on Levaquin for now.      Urine cultures will be sent.  His blood test will be repeated      tomorrow.  2. Mild acute renal failure with hyperkalemia.  He is being hydrated      as we speak.  We will repeat another BMET at 9:00 p.m. tonight,      make sure his potassium levels have normalized.  Labs will be      followed on a daily basis, while he is here.  3. Recent urological procedure.  His Foley has been taken out.  If he      does not make urine by 9:00 to 10:00 p.m. tonight, we will have      urology help [name] place a Foley back in, and then I think he can just      his follow up with his urologist at [facility].  4. He has diabetes.  We will put him on sliding scale, continue his      Actos.  Hold his metformin because of acute renal failure.  5. History of hypertension.  Continue his antihypertensive agents.      For his sleep apnea, continue his BiPAP.  We will continue his      oxygen.  I am anticipating this patient will be able to go home in      the next couple of days.      Osvaldo Shipper, MD  Electronically Signed     GK/MEDQ  D:  12/18/2007  T:  12/18/2007  Job:  [id number]   cc:   Gerrit Friends. Dietrich Pates, MD, [facility]  [address]  [place], [place] [address]   [facility] Dr. Tana Conch,  Urologist

## 2011-02-02 NOTE — Assessment & Plan Note (Signed)
[facility]                         ELECTROPHYSIOLOGY OFFICE NOTE   FLOYD, WADE                     MRN:          [id number]  DATE:08/03/2007                            DOB:          1961/02/15    The patient was seen in the [facility] on August 03, 2007, for  follow-up of his Medtronic EnPulse model #E2DR01.  Date of implant was  June 12, 2004, for bradycardia.   On interrogation of his device today his battery voltage is 2.78.  P  waves measured 2.0 to 4.0 millivolts with an atrial capture threshold of  0.75 volts at 0.4 milliseconds.  R waves measured 8.0 to 16.0 millivolts  with a ventricular pacing threshold of 0.5 volts at 0.4 milliseconds and  a ventricular lead impedance of 458 ohms.  Atrial lead impedance was 352  ohms.  There was one mode switch totaling less than 0.1% of the time  noted.  He is not on Coumadin therapy.  He is atrially pacing 47.8% of  the time and ventricularly pacing 12.7% of the time.  Capture adaptive  was programmed on in both the A and V.  No changes were made in  parameters and he will be seen back again in six months' time.  Underlying rhythm today was a sinus rhythm at 64 beats per minute.      Altha Harm, LPN  Electronically Signed      Doylene Canning. Ladona Ridgel, MD  Electronically Signed   PO/MedQ  DD: 08/03/2007  DT: 08/03/2007  Job #: [id number]

## 2011-02-02 NOTE — Letter (Signed)
June 04, 2008    Karrie Doffing, MD  [facility]  [address]  [place], [place] [address]   RE:  [name], [name]  MRN:  [id number]  /  DOB:  [birth year]   Dear Dr. Illene Regulus:   [name] returns to the office for continued assessment and treatment of  multiple issues including conduction system disease that required  placement of a permanent pacemaker, mild aortic stenosis, hypertension,  dyslipidemia, and intermittent chest discomfort.  Since his last visit,  he was seen in the emergency department at [facility] in June with an  episode of chest pain.  Testing was negative, and he was discharged to  home.  He has had only one recurrent episode since.  He was also  admitted to [facility] with respiratory symptoms and found to  have resting hypoxemia.  The [facility] are not entirely clear as to  what his pulmonary diagnosis is other than sleep apnea.  It sounds as if  he may have some chronic lung damage from previous pneumonia.  Ms.  [name] is unaware of a CT scan of the chest having been performed.   [name] is also been evaluated by Dr. Jena Gauss within the past few  months for liver function abnormalities.  He apparently had similar  abnormalities many years ago that were attributed to hormonal  injections.  The possibility of cirrhosis is being considered.  He has  never had a biopsy.  No specific etiology has yet been identified.   MEDICATIONS:  Medications are unchanged from his last visit, except for  the substitution of Celexa for Lexapro.  He uses albuterol p.r.n. with  relief of respiratory symptoms and sometimes chest discomfort.   PHYSICAL EXAMINATION:  GENERAL:  Impassive, pleasant gentleman in no  acute distress.  VITAL SIGNS:  The weight is 202, 1 pound less than in May.  Blood  pressure 105/70, heart rate 65 and regular, respirations 14.  HEENT:  Disconjugate gaze.  NECK:  No jugular venous distention; no carotid bruits.  LUNGS:  Clear.  CARDIAC:  Distant first and second heart sounds; grade 2/6 systolic  murmur.  ABDOMEN:  Soft and nontender; no organomegaly.  EXTREMITIES:  Violaceous discoloration of the skin of the lower  extremities below the knee; distal pulses intact.  Minimal edema.   IMPRESSION:  [name] is stable from a cardiac standpoint.  His  pacemaker was assessed a few months ago by Dr. Ladona Ridgel and is functioning  normally.  His LFTs show mild elevation of indirect and total bilirubin  with alkaline phosphatase twice the upper limit of normal.  I doubt that  this is due to lovastatin, but this medication will be discontinued for  the time being.  A repeat hepatic profile would be obtained in 1 month.  Fatty liver in the setting of diabetes certainly cannot be a cause for  the observed abnormalities.  Actos has also been associated with hepatic  abnormalities.  It is unclear to me whether serologies for hepatitis  have been obtained.  He has mild anemia, but hemochromatosis should also  be considered.  I will certainly leave evaluation of this problem to the  experts and plan to see [name] again in 4 months.    Sincerely,      Gerrit Friends. Dietrich Pates, MD, [facility]  Electronically Signed    RMR/MedQ  DD: 06/04/2008  DT: 06/05/2008  Job #: [id number]   CC:   R. Roetta Sessions, M.D.  Kern Reap, MD

## 2011-02-02 NOTE — Consult Note (Signed)
NAME:  [name], [name]              ACCOUNT NO.:  [id number]   MEDICAL RECORD NO.:  [id number]          PATIENT TYPE:  INP   LOCATION:  A313                          FACILITY:  [facility]   PHYSICIAN:  R. Roetta Sessions, M.D. DATE OF BIRTH:  1961/06/12   DATE OF CONSULTATION:  12/20/2007  DATE OF DISCHARGE:                                 CONSULTATION   REASON FOR CONSULTATION:  Mass near sigmoid colon.   REQUESTING PHYSICIAN:  Dorris Singh, DO.   Patient is a 50 year old mentally challenged Caucasian gentleman.  He  was admitted due to nausea, vomiting, abdominal pain.  He had a  cystoscopy with urethral dilation at [facility] back on December 04, 2007.  He was discharged with a Foley catheter.  He started having some  lower abdominal pain, some nausea and vomiting.  It was felt that his  Foley catheter was not functioning.  He actually had this removed and  passed about 400 cc of urine with resolution of his symptoms.  He was  noted to have significant leukocytosis of 23,000.  He was seen by Dr.  Jerre Simon.  He had a noncontrast CT on admission.  He had a contrast CT  today.  He had an abnormal attenuation pattern of the liver.  Dr. Jena Gauss  and I reviewed the films with Dr. Frazier Richards.  It was felt that this was  not consistent with fatty liver or a metastatic disease and possibly a  hepatitis-type picture.  He also had chronic scarring of the right  kidney.  He is noted to have a 3.5 x 3.4 cm soft tissue mass adjacent to  the sigmoid colon in the left pelvis with a long stalk originating deep  within the pelvis between the urinary bladder and the rectum.  This was  a questionable sinus tract.   Reviewing the CT with Dr. Frazier Richards and another radiologist, differential  for this lesion includes occult neoplasm versus undescended testicle  versus another process.   The patient states that his abdominal pain is improved, but he does  continue to have some lower pelvic abdominal pain in the  midline.  He  has had no further nausea and vomiting.  He feels better.  He has had no  gross hematuria or dysuria.   HOME MEDICATIONS:  1. Metformin ER 1 gm b.i.d.  2. Protonix 40 mg daily.  3. Atenolol 50 mg daily.  4. Enalapril 5 mg daily.  5. Lovastatin 40 mg daily.  6. Actos 45 mg daily.  7. Celexa 20 mg nightly.  8. Multivitamin daily.  9. Advair b.i.d.  10.Aspirin 81 mg daily.  11.Albuterol nebulizer p.r.n.  12.Nitroglycerin sublingual p.r.n.  13.Percocet 1-2 p.r.n.  14.Allegra 180 mg daily.  15.Bactrim DS.  Has been on since the surgery.  16.O2 nasal cannula.  17.BiPAP.   ALLERGIES:  PENICILLIN causes anaphylaxis.   PAST MEDICAL HISTORY:  Per medical chart.  1. He has aortic stenosis.  2. Type 2 diabetes mellitus.  3. Dyslipidemia.  4. Hypertension.  5. Pacemaker for bradycardia.  6. Sleep apnea.  7. Cardiac cath five years ago  with no significant coronary artery      disease.  8. Sinus surgery.  9. Tonsillectomy.  10.He is hard of hearing.  11.He has mental deficits.  12.Urinary retention, status post recent urological procedures, as      outlined above.  13.According to Dr. Rudean Haskell note, he had a bilateral orchiectomy for      undescended testicles, but we do not have any records of this.  The      patient describes this procedure as a teenager.   FAMILY HISTORY:  Cousin with colorectal cancer.   SOCIAL HISTORY:  Lives with his wife.  He denies tobacco, alcohol, or  drug use.   REVIEW OF SYSTEMS:  GI:  See HPI.  In addition, denies any weight loss.  Denies any ongoing nausea and vomiting.  No dysphagia, odynophagia, or  heartburn.  No constipation, diarrhea, melena, or rectal bleeding.  CARDIOPULMONARY:  No chest pain or shortness of breath.  GENITOURINARY:  See HPI.   PHYSICAL EXAMINATION:  Temperature 97, pulse 71, respirations 16, blood  pressure 110/75, O2 sats 97% on room air.  GENERAL:  A pleasant, obese Caucasian gentleman who is very hard  of  hearing.  He appears to understands questions and answers most  appropriately.  He is not able to provide too much detail as far as his  medical history.  SKIN:  Warm and dry.  No jaundice.  HEENT:  Sclerae are anicteric.  Oropharyngeal mucosa moist and pink.  No  lymphadenopathy.  CHEST:  Lungs are clear to auscultation.  CARDIAC:  Regular rate and rhythm.  A 2/6 systolic ejection murmur.  ABDOMEN:  Obese.  Positive bowel sounds.  Soft.  He has mild suprapubic  tenderness to deep palpation.  No organomegaly or masses appreciated.  Limited due to body habitus.  No rebound or guarding.  No abdominal  bruits or hernias.  LOWER EXTREMITIES:  Some chronic changes to the lower extremities with 1-  2+ pitting edema bilaterally.   LABS:  Sodium 134, potassium 5.1, BUN 19, creatinine 1.29.  Glucose 135.  White count is down from 23,000 to 10,300.  Hemoglobin down from 12.1 to  10.6.  MCV normal at 79.1.  Platelets 255,000.  Total bilirubin 1.3,  alkaline phosphatase 147, AST 46, ALT 32, albumin 4.1.   CT of the abdomen and pelvis was outlined above.   IMPRESSION/RECOMMENDATIONS:  Patient is a 50 year old gentleman with a  soft tissue mass in the pelvis between the urinary bladder and the  rectum/sigmoid colon area.  It does not appear to communicate with the  colon.  It appears to be on the stalk.  After review with radiologist  and discussion with Dr. Jena Gauss, we recommend a surgical consultation for  surgical biopsy.  Patient possibly had a bilateral orchiectomy, and in  the differential would be an undescended testicle versus occult neoplasm  versus another process.  At this point in time, he does not have any  indication for colonoscopy.  He is completely asymptomatic from a  gastrointestinal standpoint.  This mass does not appear to communicate  with the colon by CT.  In addition, he also has an abnormal liver, which  does not appear to be a typical fatty liver or metastatic process,  per  Dr. Frazier Richards.  A question of hepatitis has been raised.  He has  minimally elevated LFTs.  We will recheck these in the morning.  We will  make further recommendations.   I would like to  thank Dr. Dorris Singh for allowing [name] to take part  in the care of this patient.  Further recommendations to follow.      Tana Coast, P.AJonathon Bellows, M.D.  Electronically Signed    LL/MEDQ  D:  12/20/2007  T:  12/20/2007  Job:  [id number]   cc:   Dr. Stormy Fabian in [facility] [place], [place]

## 2011-02-02 NOTE — Group Therapy Note (Signed)
NAME:  [name], [name] NO.:  [id number]   MEDICAL RECORD NO.:  [id number]          PATIENT TYPE:  INP   LOCATION:  A313                          FACILITY:  [facility]   PHYSICIAN:  Dorris Singh, DO    DATE OF BIRTH:  07/07/1961   DATE OF PROCEDURE:  12/20/2007  DATE OF DISCHARGE:                                 PROGRESS NOTE   HISTORY:  The patient was seen today and originally admitted for UTI,  seemed to be doing well without any problems and abdominal pain.  It was  recommended that the patient actually by neurology have a CT of the  abdomen done which was done yesterday and completed.  They did find an  abnormality in his pelvis that seems to be some type of mass that is  anchored to by a stalk possibly to the sigmoid colon.  At this point in  time I have consulted GI and I have discussed the case with oncology who  is waiting to see if there is any further input they may need to add.  It has also been recommended that the patient have a PET scan regarding  his current findings by radiology.   PHYSICAL EXAMINATION:  VITAL SIGNS:  His vitals are afebrile and within  normal limits.  LUNGS:  His lungs are clear to auscultation.  ABDOMEN:  His abdomen is soft, nontender, nondistended.  However, the  patient is stating that he has a pinpoint tenderness right at the  xiphoid that is reproducible pain.  It does not radiate.   LABS:  Are as follows, white count of 10.3, hemoglobin 10.6, hematocrit  32.0, platelet count of 255.  His sodium was 134, chloride 5.1,  potassium 10.5, CO2 25, glucose 135, BUN 19 and creatinine 1.29.   ASSESSMENT AND PLAN:  1. Abdominal pain secondary to urinary retention because of Foley      catheter.  He seems to be doing well.  Urology is on the case and      await any further recommendations they may have.  2. Identified pelvic mass identified by CT.  Will have GI give any      recommendations they may have.  Unsure what type of mass  this could      be but await any further recommendations.  3. History of hypertension.  Hypertension is stable.  4. History of sleep apnea and COPD (chronic obstructive pulmonary      disease) and all of these are stable.  5. The patient also has history of type 2 diabetes.  Will have      diabetic education come and see him as well.  His renal      insufficiency has improved with holding of his metformin.  Will go      ahead and continue to cover him with sliding scale and up until GI      sees him, await for any further recommendations they may have for      him.      Dorris Singh, DO  Electronically Signed     CB/MEDQ  D:  12/20/2007  T:  12/20/2007  Job:  [id number]

## 2011-02-02 NOTE — Op Note (Signed)
NAME:  [name], [name] NO.:  [id number]   MEDICAL RECORD NO.:  [id number]          PATIENT TYPE:  OIB   LOCATION:  A313                          FACILITY:  [facility]   PHYSICIAN:  Tilford Pillar, MD      DATE OF BIRTH:  1961-06-15   DATE OF PROCEDURE:  09/27/2008  DATE OF DISCHARGE:  09/27/2008                               OPERATIVE REPORT   PREOPERATIVE DIAGNOSIS:  Fatty liver changes.   POSTOPERATIVE DIAGNOSIS:  Fatty liver changes.   PROCEDURE:  Laparoscopic liver biopsy with a Tru-Cut needle biopsy.   SURGEON:  Tilford Pillar, MD   ANESTHESIA:  General endotracheal and local anesthetic 0.5% Sensorcaine  plain.   SPECIMEN:  Multiple liver biopsies of the left lobe of the liver.   ESTIMATED BLOOD LOSS:  Less than 100 mL.   INDICATIONS:  The patient is a 50 year old male with multiple medical  problems who has been followed closely by Dr. Jena Gauss with  Gastroenterology for chronic liver issues.  He has had then followed  closely and has been noted to have the appearance of fatty liver on  radiographic evaluation and on CT evaluations of the abdomen and pelvis.  Due to the patient's history, it was recommended and requested by Dr.  Jena Gauss to obtain the liver biopsy.  The risks, benefits, and alternatives  of the laparoscopic liver biopsy were discussed with the patient and the  patient's wife in the office including but not limited to, the risk of  bleeding, infection, bile leak, as well as possibility of intraoperative  cardiac and pulmonary events.  Additionally, alternative techniques were  discussed with the patient including CT biopsy of the liver.  The  patient did wish to proceed with a operative biopsy rather than a  radiographically obtained biopsy and he was consented for planned  procedure.   OPERATION:  The patient was taken to the operating room.  He was placed  in a supine position on the operating room table, at which time, general  anesthetic was  administered.  Once the patient was asleep, he was  endotracheally intubated by Anesthesia.  At this point, his abdomen was  prepped with DuraPrep solution and draped in standard fashion.  A stab  incision was created supraumbilically with an 11-blade scalpel.  Additional dissection down to the subcuticular tissue was carried out  using a Kocher clamp, was utilized to grasp the anterior abdominal  fascia most anteriorly.  A Veress needle was inserted.  Saline drop test  was utilized to confirm intraperitoneal placement and pneumoperitoneum  was initiated.  Once sufficient pneumoperitoneum was obtained, an 11-mm  trocar was inserted over a laparoscope allowing visualization of the  trocar entering into the peritoneal cavity.  At this time, the inner  cannula was removed.  The laparoscope was reinserted.  There was no  evidence of any trocar or Veress needle placement injury.  At this time,  I did create a stab incision in the epigastric region and the subxiphoid  position with a scalpel and placement of a 5-mm trocar was conducted  under direct visualization with the  laparoscope.  Through the 5-mm  trocar, I was able to gain access to the left lobe of the liver from  which I obtained multiple biopsies with the Tru-Cut biopsy needle and  the specimens were placed on the back table and sent as permanent  specimen to pathology.  The remaining capsular puncture sites were  controlled for hemostasis using electrocautery.  Once hemostasis was  controlled, a piece of Surgicel was brought into the field was placed  over the capsule.  Gentle pressure was maintained on the Surgicel over  the puncture sites with a regular grasper.  Hemostasis was excellent.  At this time, the pneumoperitoneum was evacuated.  The trocar was  removed.  A 0 Vicryl suture was utilized to reapproximate the fascia at  the umbilical trocar site.  The local anesthetic was instilled.  A 4-0  Monocryl was utilized to  reapproximate the skin edges at both trocar  sites.  The skin was washed and dried with moist and dry towel.  Benzoin  was applied around both incisions and half-inch Steri-Strips were  placed.  Drapes were removed.  The patient was allowed to come out of  general aesthetic.  He was transferred back to regular hospital bed in  stable condition.  At the conclusion of the procedure, all laps, sponge,  and needle counts were correct.  The patient tolerated the procedure  well.      Tilford Pillar, MD  Electronically Signed     BZ/MEDQ  D:  09/27/2008  T:  09/28/2008  Job:  [id number]   cc:   Dr. Karrie Doffing   R. Roetta Sessions, M.D.  [address]  [place]  [place] [address]

## 2011-02-02 NOTE — Assessment & Plan Note (Signed)
[facility]                         ELECTROPHYSIOLOGY OFFICE NOTE   [name], [name]                     MRN:          [id number]  DATE:02/22/2007                            DOB:          08-23-1961    [name] returns today for followup. He is a very pleasant, middle-  age man with symptomatic bradycardia who underwent permanent pacemaker  insertion many years ago who presented to the hospital with syncope and  was subsequently found to have abnormal RV pacing lead with an elevated  pacing impedance bipolar. [name] device was reprogrammed and he  returns today for followup. He has had no additional dizziness spells or  near syncopal spells and denies chest pain or shortness of breath. He  does have chronic dyspnea secondary to his underlying lung disease and  is on home oxygen.   PHYSICAL EXAMINATION:  GENERAL:  He is a pleasant, middle-age man in no  acute distress.  VITAL SIGNS:  The blood pressure today was 100/74, the pulse was 68 and  regular. The respirations were 18. The weight was 216 pounds.  NECK:  Revealed no jugular venous distention.  LUNGS:  Revealed scattered rales in the bases, otherwise clear. There  were no wheezes present.  EXTREMITIES:  Trace peripheral edema bilaterally.   MEDICATIONS:  1. Insulin 70/30.  2. Glipizide 10 mg daily.  3. Actos 45 daily.  4. Lovastatin 40 daily.  5. Aspirin.  6. Enalapril 10 daily.  7. Atenolol 50 daily.  8. Allegra.  9. Lexapro 10 daily.   Interrogation of his pacemaker today demonstrates a [employer]onic Impulse  with P waves of 2-1/2, R waves of 8. The impedance was 380 in the  atrium, 487 (unipolar) in the ventricle. The threshold was 1 at 0.4 in  the atrium, 0.5 at 0.4 in the right ventricle. Battery voltage 2.78  volts. There were no high rate episodes. He was V-pacing only 4% of the  time.   IMPRESSION:  1. Symptomatic bradycardia.  2. Severe chronic obstructive  pulmonary disease on home oxygen.  3. Status post pacemaker insertion with elevated bipolar pacing      impedance.   DISCUSSION:  Overall [name] is stable and his pacemaker with  reprogramming is working satisfactorily. We will see him back in the  office in 6 months.     Doylene Canning. Ladona Ridgel, MD  Electronically Signed    GWT/MedQ  DD: 02/22/2007  DT: 02/22/2007  Job #: [id number]

## 2011-02-02 NOTE — Letter (Signed)
July 22, 2008    Arlyss Queen, MD  [facility]  [address]  [place], [place]  [address]   RE:  [name], [name]  MRN:  [id number]  /  DOB:  January 02, 1961   Dear Dr. Illene Regulus:   Mr. Fickle returns to the office for continued assessment and  treatment of chest discomfort.  Since starting amlodipine, he has done  quite well with regard to this symptom.  He is feeling fine overall.  He  continues to be evaluated for abnormal LFTs.  His statin drug has been  discontinued, but this apparently did not result in normalization of his  tests.  I would think the most likely etiology is a fatty liver.   PHYSICAL EXAMINATION:  GENERAL:  Pleasant gentleman in no acute  distress.  VITAL SIGNS:  The weight is 203, 8 pounds more than 1 month ago but 20  pounds less than his peak over the last couple of years.  The blood  pressure is 110/70, heart rate 64 and regular, respirations 14.  NECK:  No jugular venous distention noted.  Normal carotid upstrokes  without bruits.  LUNGS:  Clear.  CARDIAC:  Normal first and second heart sounds; grade 2/6 basilar  systolic ejection murmur.  ABDOMEN:  Soft and nontender; no organomegaly.   IMPRESSION:  Mr. Obst is doing well from a cardiac standpoint.  His  conduction system disease is adequately treated with pacing.  He has no  significant valvular disease - aortic  stenosis is very mild and unlikely to cause problems in the foreseeable  future.  He has no coronary artery disease.  His chest discomfort is  adequately managed.  I will plan to see this nice gentleman again in 8  months.  If LFTs remain abnormal and lipids are at least moderately  elevated, he may wish to resume statin therapy.    Sincerely,      Gerrit Friends. Dietrich Pates, MD, [facility]  Electronically Signed    RMR/MedQ  DD: 07/22/2008  DT: 07/23/2008  Job #: [id number]

## 2011-02-02 NOTE — Assessment & Plan Note (Signed)
NAMEMarland Kitchen  [name], [name]               CHART#:  [id number]   DATE:  07/29/2008                       DOB:  Jan 18, 1961   CHIEF COMPLAINT:  Followup of liver disease, anemia.   PROBLEM LIST:  1. Elevated LFTs with abnormal-appearing liver on CT, not felt to be      consistent with fatty liver or metastatic disease.  Workup has      included a negative hepatitis B surface antigen, negative hepatitis      C antibody, normal ferritin, negative ANA and AMA, negative anti-      smooth muscle antibody, ceruloplasmin, and alpha-1 antitrypsin      level was normal.  Lovastatin was stopped and Nicholas transaminases      have improved a little when last checked in September.  2. EGD and colonoscopy on Feb 09, 2008, revealed a small hiatal      hernia, patchy antral erosions, tiny scattered narrow-mouth sigmoid      diverticulum.  TI was normal.  Biopsy from the duodenum was      negative and biopsies from the stomach revealed chronic gastritis,      but no Helicobacter pylori.  Recent small bowel Givens capsule was      done because of anemia and one Hemoccult positive stool out of 3.      Givens capsule showed normal-appearing small bowel.  3. History of pelvic mass and evaluated previously by [name] and      [name], [name] and according to the patient's [name], is      felt nothing needs to be done.  4. Type 2 diabetes mellitus.  5. Aortic stenosis.  6. Pacemaker for bradycardia.  7. Mental deficits and hard of hearing.  8. Recent diagnosis of hypothyroidism.  9. The patient has also had quantitative immunoglobulins done, which      were unremarkable.   SUBJECTIVE:  The patient presents for followup.  He is doing well.  He  has no complaints.  He denies any blood in the stool or melena.  Bowel  movements are regular.  Appetite is good.  No heartburn, dysphagia, or  odynophagia.  He is on oxygen and gives the history of COPD.  He is also  no longer on lovastatin and new medication  includes Levoxyl and  amlodipine.   CURRENT MEDICATIONS:  See updated list.   ALLERGIES:  Penicillin.   PHYSICAL EXAMINATION:  VITAL SIGNS:  Weight 204 stable, temperature  97.7, blood pressure 120/76, and pulse 64.  GENERAL:  Pleasant elderly hard of hearing gentleman, in no acute  distress.  SKIN:  Warm and dry.  No jaundice.  HEENT:  Sclerae nonicteric.  Oropharyngeal mucosa moist and pink.  CHEST:  Lungs are clear to auscultation.  CARDIAC:  Regular rate and rhythm.  No murmurs, rubs, or gallops.  ABDOMEN:  Positive bowel sounds.  Abdomen is soft, obese, nontender, and  nondistended.  No organomegaly or masses.  LOWER EXTREMITIES:  Trace  pedal edema bilaterally.   IMPRESSION:  The patient is a 50 year old gentleman with history of  elevated LFTs.  The last set that we have on her chart was from  June 18, 2008.  Nicholas total bilirubin was 2.2, mostly indirect  bilirubin of 1.6, alkaline phosphatase 326, AST was 48 down from 78, and  ALT  was 45 down from 77, and albumin of 4.4.  He is due for recheck at  this time.  If these numbers remain elevated, he will likely need a  liver biopsy as previously discussed with the patient.  With regards to  Nicholas anemia, he did have 1 or 3 Hemoccults positive.  Nicholas EGD,  colonoscopy, and Givens small bowel capsule endoscopy did not really  reveal any cause of Nicholas significant iron deficiency anemia.  We will  recheck Nicholas hemoglobin, if it remains low.  We would recommend a  hematological evaluation.   PLAN:  1. Recheck CBC and LFTs.  2. Further recommendations to follow.       Tana Coast, P.A.  Electronically Signed     R. Roetta Sessions, M.D.  Electronically Signed    LL/MEDQ  D:  07/29/2008  T:  07/29/2008  Job:  [id number]   cc:   Arlyss Queen, MD

## 2011-02-02 NOTE — Discharge Summary (Signed)
NAME:  [name], [name] NO.:  [id number]   MEDICAL RECORD NO.:  [id number]          PATIENT TYPE:  INP   LOCATION:  A313                          FACILITY:  [facility]   PHYSICIAN:  Dorris Singh, DO    DATE OF BIRTH:  07-08-1961   DATE OF ADMISSION:  12/18/2007  DATE OF DISCHARGE:  04/03/2009LH                               DISCHARGE SUMMARY   PRIMARY CARE PHYSICIAN:  Dr. Lin Givens in [employer].   UROLOGIST:  Dr. Caralee Ates at [facility].   CARDIOLOGIST:  Dr. Dietrich Pates.   RADIOLOGY TESTS THAT HE HAD DONE:  Include on March 30 a CT of the  abdomen without contrast which showed a right renal scarring no acute  abnormality on noncontrast CT, nonspecific retroperitoneal stranding;  recommend correlation with urinalysis.  Noncontrast CT is insensitive  for renal or pyelonephritis and the same thing with pelvis, mild  inflammatory changes adjacent to the urinary bladder, correlation with  urinalysis is recommended.  CT with contrast of the abdomen and pelvis  demonstrated, impression:  Attenuation pattern of the liver parenchyma  is abnormal and heterogeneous; correlation with the patient's liver  function test is recommended; no focal mass or abnormal fluid collection  noted; chronic scarring of the right kidney and of the pelvis; soft  tissue mass adjacent to sigmoid colon as above; the differential  diagnosis includes malignancy versus inflammatory mass.  A PET CT is  suggested to evaluate the degree of hypermetabolic internal and to  determine the likelihood of malignancy.  Alternatively, percutaneous  biopsy would be suggested.  He had a retrograde urethrogram which  demonstrated high-grade stricture of the mid urethra with only tiny thin  column of contrast available to faintly opacify the urinary bladder.  Postvoid residual volume noted.  No evidence of communicating  diverticulum, duplication or fistula of the urethra.   His H&P was done by Dr. Rito Ehrlich; please  refer to that.  To summarize,  the patient is a 50 year old Caucasian male with a history of 1-day  history of nausea, vomiting, abdominal pain.  His Foley was removed and  he produced about 400 mL of foul-smelling urine which relieved his pain.  He also had been seen recently for a procedure at [facility] 2  weeks ago for a urethral stricture.  He was admitted to the service of  [facility].  He was started on Levaquin.  Urine cultures were sent.  Also, he had mild acute renal failure with hypokalemia.  He was hydrated  and a BMET was done to see if his potassium had normalized.  After his  Foley was taken out, urology was then consulted to see him and his  history of hypertension he was continued on all of his home medications.  The patient was seen by urology who recommended a CT of the abdomen with  contrast.  Upon finding that it was found that the patient actually had  a mass in the pelvis.  I had a discussion with Dr. Mariel Sleet because a  PET scan was recommended.  He recommended we start with GI.  I went  ahead  and contacted Dr. Bethann Goo. Cira Servant for consultation.  They saw the  patient.  They felt that it was not gastrointestinal in nature.  However, the patient is due for a screening colonoscopy and that could  be done outpatient.  Also, consulted surgery to see him to get their  recommendations and evaluations.  Based on the CT Dr. Tilford Pillar  decided to order a retrograde urethrogram which demonstrated a continued  stricture.  Contacted the patient's urologist, Dr. Caralee Ates, office  regarding possible discharge and follow-up.  Explained to his nurse,  Olegario Messier, about what was found with the retrograde urethrogram and that we  needed him to follow back up with him for further evaluation.  We have  faxed over all of his information to their office and gave the patient  explicit details as to proper follow-up per Dr. Caralee Ates' nurse.   The patient will be sent home on the  following medications:  1. Metformin 1000 mg one p.o. b.i.d.  2. Protonix 40 mg one p.o. daily.  3. Atenolol 500 mg one p.o. daily.  4. Enalapril 5 mg one p.o. daily.  5. Lovastatin 40 mg one p.o. daily.  6. Actos 45 mg one p.o. daily.  7. Citalopram 20 mg q.h.s.  8. Multivitamins once a day.  9. Advair Diskus two puffs twice a day.  10/  Aspirin 81 mg once daily.  1. Albuterol inhaler as needed.  2. Nitroglycerin 0.4 as needed.  3. Oxycodone/acetaminophen as needed.  4. Fexofenadine 100 mg once a day.  5. Bactrim DS has been discontinued.  His new medication will be      Levaquin 500 mg one p.o. daily times 5 days.   His specific instructions are that he is to increase activity slowly and  he eat his normal diet.  He will see Dr. Jena Gauss in 4 weeks for a  colonoscopy, has an appointment on May 5 at 11 a.m.  Also, he is  scheduled to see to see Dr. Caralee Ates as on December 25, 2007, at 1:00 p.m.  He is to the Levaquin for 5 days and per Dr. Caralee Ates' nurse relaying his  instructions, if he has any abdominal pain he is to go to [facility] ER Dr. Caralee Ates is the urologist on-call and can see him and  admit him to the hospital at that point in time and it is recommended  that there is a follow-up on his CT abnormality with his urologist and  we will fax over his labs and tests to his office.  He has an  appointment, as mentioned above.  I relayed of this information to the  patient's wife who stated and understanding and the patient was then  discharged to home in stable condition.      Dorris Singh, DO  Electronically Signed     CB/MEDQ  D:  12/22/2007  T:  12/22/2007  Job:  [id number]

## 2011-02-02 NOTE — Assessment & Plan Note (Signed)
[facility]                         ELECTROPHYSIOLOGY OFFICE NOTE   [name], [name]                     MRN:          [id number]  DATE:03/20/2008                            DOB:          10-14-1960    [name] returns today for followup.  He is a very pleasant, middle-  aged male with symptomatic bradycardia, diabetes, hypertension,  dyslipidemia, and sleep apnea who returns today for followup.  The  patient was recently at a dental hospital with epistaxis and underwent  cauterization of his nasal bleeding.  Otherwise, he has been stable.  He  denies chest pain.  He denies shortness of breath.  His wife who is with  him today notes that he picks his nose quite frequently.   MEDICATIONS:  1. Actos 45 a day.  2. Lovastatin 40 a day.  3. Aspirin 81 a day.  4. Atenolol 50 a day.  5. Allegra 180 a day.  6. Lexapro 10 a day.  7. Multiple vitamin.  8. Protonix 40 a day.  9. Metformin 1 gram twice daily.  10.Enalapril 5 a day.   PHYSICAL EXAMINATION:  He is a pleasant, well-appearing man in no  distress.  Blood pressure today was 130/76, pulse 76 and regular, respirations 18,  and weight was 203 pounds.  NECK:  No jugular venous distention.  LUNGS:  Clear bilaterally to auscultation.  No wheezes, rales, or  rhonchi are present.  CARDIOVASCULAR:  Regular rate and rhythm.  Normal S1 and S2.  ABDOMEN:  Obese, nontender, and nondistended.  EXTREMITIES:  Demonstrated 1+ peripheral edema bilaterally with evidence  of venous insufficiency changes.   Interrogation of his pacemaker demonstrates [employer] EnPulse, the P-  waves were 2, the R-waves were 5, the impedance 320 in the A and 416 in  the V, and the threshold 0.75 at 0.4 in the A and 0.5 at 0.4 in the RV.  Battery voltage was 2.78 volts.  There was approximately 100 mode  switches, they are all very brief less than 0.1% of the time.  He was  36% A-paced.   IMPRESSION:  1. Symptomatic  bradycardia.  2. Hypertension.  3. Dyslipidemia.  4. Obesity.  5. Nasal bleeding.   DISCUSSION:  Overall, [name] is stable.  He is not a Coumadin  candidate.  I will plan to see the patient back for pacemaker followup  in 1 year.  He will follow up in our [facility] in 6 months or  sooner.     Doylene Canning. Ladona Ridgel, MD  Electronically Signed    GWT/MedQ  DD: 03/20/2008  DT: 03/21/2008  Job #: [id number]

## 2011-02-02 NOTE — Cardiovascular Report (Signed)
NAMEARSHAWN, [name] NO.:  [id number]   MEDICAL RECORD NO.:  [id number]          PATIENT TYPE:  INP   LOCATION:  2021                         FACILITY:  [facility]   PHYSICIAN:  Veverly Fells. Excell Seltzer, MD  DATE OF BIRTH:  February 23, 1961   DATE OF PROCEDURE:  06/14/2008  DATE OF DISCHARGE:                            CARDIAC CATHETERIZATION   PROCEDURES:  1. Left heart catheterization.  2. Selective coronary angiography.  3. Left ventricular angiography.   INDICATIONS:  [name] is a 50 year old gentleman with multiple  cardiac risk factors.  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  He presented with a chest pain syndrome  and had elevation of his cardiac markers.  He was referred for cardiac  catheterization.   PROCEDURAL DETAILS:  Risks and indications of the procedure were  reviewed with the patient.  Informed consent was obtained.  The right  groin was prepped, draped, and anesthetized with 1% lidocaine using  modified Seldinger technique.  A 5-French sheath was placed in the right  femoral artery.  Standard 5-French Judkins catheters were used for  coronary angiography and left ventriculography.  All catheter exchanges  were performed over guidewire.  The patient tolerated the procedure  well.  There were no immediate complications.   FINDINGS:  Aortic pressure 137/85 with a mean of 107.  Left ventricular  pressure 140/24.   Coronary angiography, left mainstem:  The left main is angiographically  normal and bifurcates into the LAD and left circumflex.   LAD:  The LAD is a moderate-sized vessel proximally.  It supplies 3  small diagonal branches.  There are 4 septal perforators.  The LAD  reaches the mid anterior wall, but does not extend to the apex.   Left circumflex:  The left circumflex supplies 3 OM branches.  The  second OM branch actually appears to supply much of the apex.  The left  circumflex has no significant  obstructive disease.  The second and third  marginal branches are moderate-sized vessels   Right coronary artery:  The right coronary artery is relatively small.  It supplies a PDA and PL branch.  There is a large a RV marginal branch  and a small conus.  The right coronary artery has no significant  obstructive disease.   Left ventriculography:  The left ventricle appears normal.  The LVEF is  55%.  There is no mitral regurgitation.   PLAN:  [name] will be observed overnight.  He appears to have  noncardiac chest pain.  He can be discharged home tomorrow with  outpatient followup with Dr. Dietrich Pates.      Veverly Fells. Excell Seltzer, MD  Electronically Signed     MDC/MEDQ  D:  06/14/2008  T:  06/15/2008  Job:  [id number]   cc:   Gerrit Friends. Dietrich Pates, MD, [facility]

## 2011-02-02 NOTE — Assessment & Plan Note (Signed)
NAMEMarland Kitchen  [name], [name]               CHART#:  [id number]   DATE:  11/15/2008                       DOB:  1961/07/28   Follow-up of elevated liver functions with gradual improvement.  Mr.  [name] had an extensive workup for elevated LFTs and chronicled in  the July 29, 2008, office note.  His Lovastatin had been previously  stopped along the way.   He is status post laparoscopic liver biopsy by [name].  Unfortunately the specimen was reviewed with [name] and he  really did not get much more than just capsular biopsies and therefore  the histology is entirely nondiagnostic, although at this point in time  nonalcoholic fatty liver disease (steatohepatitis) would remain very  high on the differential.  His LFTs have come down from August 15, 2008.  Bilirubin was down to 2.2, alkaline phosphatase 191,  transaminases were actually normal with an AST/ALT 35/29.  He apparently  had some recent labs done through [name] office the results of  which I do not have for review.  He saw [name] and from cardiac  standpoint he is felt to be doing fairly well.  He really has no GI  symptoms at this time.  Reflux symptoms are well controlled on Protonix  40 mg orally daily.  History of iron deficiency anemia with one of three  Hemoccults positive.  He is status post EGD, colonoscopy and Given  capsule study without any significant cause of Hemoccult positive/anemia  being found.   CURRENT MEDICATIONS:  See updated list.   ALLERGIES:  PENICILLIN.   PHYSICAL EXAMINATION:  Exam today weight is down 5 pounds.  He is in no  acute distress.  He has oxygen concentrator in place.  Accompanied by  his wife.  VITAL SIGNS:  Weight 199, height 5 feet 3 inches, temperature 97.5, BP  128/82, pulse 72.  SKIN:  Warm and dry.  There is no jaundice.  ABDOMEN:  Positive bowel sounds, soft, nontender.  No obvious mass or  organomegaly.   ASSESSMENT:  1. Elevated liver function  tests most likely related to non-alcoholic      fatty liver disease (steatohepatitis).  Lovastatin could have been      a contributing factor.  Liver biopsy totally inadequate for any      histological assessment.  However, the good news is his liver      function tests are coming down.  He has been extensively evaluated      for other causes of liver disease.  2. History of iron deficiency anemia, Hemoccult positive with negative      GI workup.  Malabsorption may be an issue.  Lovastatin could have      been a contributing factor in the bump of this liver function      tests.   RECOMMENDATIONS:  1. We will retrieve labs done at [name] office to see if they      include a recent hepatic profile.  If not we will get one today.  2. I have encouraged aerobic exercise as much as can be done 30      minutes three times weekly.  This will be of general overall      benefit to his health.  3. I do not see where we have done a celiac  screen and that needs to      be done to really complete his GI evaluation.  We will research the      medical record and obtain a celiac antibody panel and IgA level if      not done.  Further recommendations to follow.   ADDENDUM:  [name] wife asks if [name] being a product  of two first cousins has anything to do with his medical problems.  I  have told her that it was my opinion that that was highly likely to a  significant extent.       [name], M.D.  Electronically Signed     RMR/MEDQ  D:  11/15/2008  T:  11/15/2008  Job:  [id number]   cc:   Dr. Karrie Doffing

## 2011-02-02 NOTE — Assessment & Plan Note (Signed)
[facility]                       [place] CARDIOLOGY OFFICE NOTE   [name], [name]                     MRN:          [id number]  DATE:11/01/2008                            DOB:          06/24/1961    CARDIOLOGIST:  Gerrit Friends. Dietrich Pates, MD, [facility]   PRIMARY CARE PHYSICIAN:  Dr. Illene Regulus   REASON FOR VISIT:  Chest pain.   HISTORY OF PRESENT ILLNESS:  [name] is a 50 year old male patient  with a history of symptomatic bradycardia status post pacemaker  implantation and sleep apnea on continuous O2 secondary to chronic  hypoxemia who underwent cardiac catheterization back in September 2009  due to chest discomfort and mildly abnormal cardiac enzymes.  At that  time, his cardiac catheterization demonstrated normal coronary arteries.  He was placed on amlodipine therapy to cover for the possibility of  coronary vasospasm.  He had actually done well without any recurrent  chest discomfort on amlodipine therapy.  He recently had a liver biopsy  that demonstrated hepatitis and he is scheduled to undergo evaluation at  [facility] soon.  He fell on the ice a few days ago and struck his  arm and chest.  He has had some chest discomfort since that time and he  came to the emergency room the other day.  His point of care markers  were negative.  He had a chest x-ray that was negative for any acute  abnormalities.  He was referred back to [name] today for followup.  He  continues to have musculoskeletal-type chest discomfort.  It is worse  with certain types of movements as well as lying down.  It comes and  goes.  It is not brought on by exertion.  It is not like the discomfort  he had in his chest that responded to amlodipine.  He was given some  pain medications in the emergency room.  He is on Vicodin and this seems  to be helping his symptoms somewhat.  He denies any significant changes  in his shortness of breath.  Denies any syncope.  He  denies any  radiating symptoms, nausea, or diaphoresis.   CURRENT MEDICATIONS:  Advair b.i.d., Metformin 500 mg b.i.d., Protonix  40 mg daily, Enalapril 5 mg daily, Levoxyl 25 mcg daily, Citalopram 20  mg daily, Actos 45 mg daily, Centrum daily,  Fexofenadine 180 mg daily, Aspirin 81 mg daily, Atenolol 50 mg daily,  Amlodipine 2.5 mg daily, Hydrocodone p.r.n., Nitroglycerin p.r.n., Neo-  Synephrine p.r.n.   PHYSICAL EXAMINATION:  GENERAL:  He is a well-nourished, well-developed  male, in no acute distress.  VITAL SIGNS:  Blood pressure today is 118/79, pulse 71, weight 200  pounds.  HEENT:  Normal  NECK:  Without JVD.  CARDIAC:  Normal S1 and S2.  Regular rate and rhythm.  LUNGS:  Clear to auscultation bilaterally.  ABDOMEN:  Soft, nontender.  EXTREMITIES:  Tight, trace edema bilaterally.  NEUROLOGIC:  He is alert and oriented x3.  Cranial nerves II through XII  grossly intact.   Electrocardiogram is notable for heart rate of 73, ventricular paced  rhythm.  Underlying  sinus rhythm is noted.   ASSESSMENT/PLAN:  1. Atypical chest pain in a 50 year old male with a history of normal      coronaries by catheterization in September 2009 and overall      preserved left ventricular function.  He seems to have      musculoskeletal-type chest pain.  It is definitely reproducible on      palpation of his chest.  It began when he fell on the ice and      struck his arm and chest.  He had a recent chest x-ray that showed      no acute abnormalities.  I have recommended he continue taking the      pain medication prescribed in the emergency room as needed for      pain.  I have also recommend moist heat 2-3 times a day.  He can      otherwise followup with [name] as previously scheduled.  He should      follow up sooner should he have any change in his symptoms or if      his symptoms continue without significant relief.  He can get      further refills of his pain medications with his primary  care      physician.  2. Abnormal LFTs, status post recent hepatic biopsy.  He will continue      followup with his gastroenterologist as outlined.  3. Symptomatic bradycardia status post prior pacemaker implantation.      He will continue followup with Dr. Ladona Ridgel as indicated.  4. Hypertension, controlled.  5. Dyslipidemia.  He is not on any medical therapy at this time      secondary to his recent hepatic issues.   DISPOSITION:  Follow up with Dr. Dietrich Pates as previously scheduled or  sooner p.r.n.      Tereso Newcomer, PA-C  Electronically Signed      Gerrit Friends. Dietrich Pates, MD, [facility]  Electronically Signed   SW/MedQ  DD: 11/01/2008  DT: 11/02/2008  Job #: [id number]   cc:   Karrie Doffing, MD

## 2011-02-05 NOTE — Letter (Signed)
January 04, 2007    Arlyss Queen, M.D.  [facility]  [address]  [place], [place] [address]   RE:  CHASEN, MENDELL  MRN:  [id number]  /  DOB:  1961/06/18   Dear Dr. Illene Regulus:   Mr. Winship returns to the office following a recent hospital  admission for syncope and subsequent stress test.  The only problem  identified in the hospital was improper function of his pacemaker.  This  was readjusted.  His pharmacologic stress nuclear study was negative for  ischemia or infarction.  Left ventricular systolic function was normal.   He has felt well since hospital discharge.  He has some occasional  atypical chest discomfort.  He has chronic class II-III dyspnea on  exertion.  He has had no recurrent light-headedness and no syncope or  falling.   MEDICATIONS:  Unchanged from his last visit, except for the addition of  Reglan 10 mg b.i.d. in the hospital.  His wife feels that this has  helped somewhat with stomach problems.   EXAM:  Pleasant gentleman in no acute distress.  The weight is 227, 7 pounds more than in February.  Height is 5 feet 3  inches.  Blood pressure 115/70, heart rate 75 and regular, respirations  16.  NECK:  No jugular venous distension.  LUNGS:  Clear.  CARDIAC:  Normal first and second heart sounds; grade 2/6 systolic  ejection murmur at the base without radiation to the carotids.  ABDOMEN:  Soft and nontender; obese; otherwise benign.  EXTREMITIES:  Mild chronic stasis changes with 1+ edema.   IMPRESSION:  Mr. Robarts is doing well from a cardiac standpoint.  Since he has had gastroesophageal reflux disease with esophageal  stricture in the past, he would more appropriately be treated with a  proton pump inhibitor than Reglan as the initial drug.  We will stop the  latter and start Protonix at a dose of 40 mg daily.  Aortic stenosis is  mild and not related to his current symptoms.  Blood pressure control is  good.  I will plan  to reassess this nice gentleman in 4 months.    Sincerely,      Gerrit Friends. Dietrich Pates, MD, [facility]  Electronically Signed    RMR/MedQ  DD: 01/04/2007  DT: 01/04/2007  Job #: [id number]

## 2011-02-05 NOTE — Consult Note (Signed)
[name], [name] NO.:  [id number]   MEDICAL RECORD NO.:  [id number]          PATIENT TYPE:  INP   LOCATION:  2036                         FACILITY:  [facility]   PHYSICIAN:  [name]. Nicholas Ridgel, MD    DATE OF BIRTH:  1961-07-01   DATE OF CONSULTATION:  12/23/2006  DATE OF DISCHARGE:                                 CONSULTATION   ALLERGIES:  He has allergy to PENICILLIN.   PRIMARY CAREGIVER:  Dr. Lendell [name].   CARDIOLOGIST:  Dr. Dietrich [name].  He sees him in [place], [place].   BRIEF HISTORY:  [name] is a 50 year old male. He has a history of  sick sinus syndrome and a high-grade heart block.  He underwent dual  chamber pacemaker implantation on April 10, 1994 under Dr. Daryel [name],  [place], [place].  He presents to the emergency room at [facility] December 21, 2006 with chest pain.   The patient describes the pain as sharp, left-sided just to the left of  the sternum. It is worse on inspiration and is painful to palpitation.  The patient apparently has been clammy and pale.  The pain has no  radiation.  He is not nauseated.  On admission his electrocardiogram  showed sinus rhythm with a rate of 62, right bundle branch block, QRS of  140 and QT is 440.  There were no S-T segment abnormalities.  Cardiac  enzymes have been less than 0.05 in the emergency room and then follow-  up 0.03 and 0.03 for Troponin I  studies.  BNP on admission was 102.  The pacemaker was interrogated in the emergency room that was in the  DDDR configuration.  The right ventricular lead impedance was greater  than 2600 ohms.  This was changed from bipolar to unipolar and the  impedance  did drop to 432 ohms with a threshold of 0.5 volts at 0.4  milliseconds.  The lead was reprogrammed to unipolar spacing/sensing.  Interrogation of his device showed that there were no arrhythmias noted  since last interrogation.  This may be of importance since the patient's  spouse  relates that the patient had a syncopal event 2 weeks ago.  He  and his wife were going out the door for the morning walk and without  warning he fell down the front steps onto his chest face-down. The  patient said he got mildly dizzy at first, but there was basically no  warning to the event.  There was no urinary or bowel incontinence.  There was no postictal confusion.  There was no convulsive activity.  He  was seen at [name] and apparently a CT of the head taken at that  time was negative.  Also the patient has echocardiogram to January 2008  which showed that the left ventricular function was normal.  The  patient's pacemaker is an EnPulse DR [id number].   PAST MEDICAL HISTORY.:  1. Pacer for sick sinus syndrome/high-grade AV  block implanted July      1995 with generator change September 2005, Dr. Daryel [name].  The  patient has not been seen there for the last 3 years  2. Hypertension.  3. Diabetes.  4. Dyslipidemia.  5. GERD with esophageal stricture, status post dilatations.  6. Benign prostatic hypertrophy.  7. Obstructive sleep apnea.  The patient is on BiPAP at night, 3      liters of oxygen bleeding in.   PAST SURGICAL HISTORY:  1. 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  2. Status post TURP.   LABORATORY STUDIES:  The PT is 14.9, INR is 1.1.  PTT is 39.  Serum  electrolytes:  sodium 136, potassium 4.3, chloride 102, carbonate 29,  BUN is 17, creatinine 1.2, glucose 248.  A complete blood count:  White  cells 9.8, hemoglobin 13.2, hematocrit 39.6 and platelet 236.  Urinalysis was negative.  Once again troponin I studies less than 0.05  then 0.03 then 0.03.  Chest x-ray shows cardiomegaly with no acute findings.   PHYSICAL EXAM:  Temperature 97.8, blood pressure 114/58, pulse of 62 and  regular, respirations of 17.  Telemetry shows that the patient is A  pacing, at 85 beats a minute.  He is awake and oriented x3.  He is very  hard of hearing but is able to  read lips to some extent and answers  questions readily.  LUNGS:  Relatively clear bilaterally.  HEART:  Regular rate and rhythm without murmur.  The patient has emesis  at the time of this a examination and when the wife was queried she says  that off and on he does have emesis.  ABDOMEN:  Soft, nondistended.  Bowel sounds are present.  EXTREMITIES:  Show no evidence of edema.  NEURO:  His neuro exam is grossly intact.   IMPRESSION:  1. Admitted with atypical chest pain.  2. Troponin I's are negative.  3. Electrocardiogram is nondiagnostic.  4. He has a pacer for sick sinus syndrome/high-grade AV block      a.     His pacemaker interrogated.  The right ventricular lead is       changed from a bipolar to unipolar configuration due to high       impedance in the bipolar mode.  5. GERD/possible diabetic gastroparesis.  6. Dyslipidemia.  7. Hypertension.  8. 2-D echocardiogram January 2008: Normal left ventricular function.  9. Obstructive sleep apnea/BiPAP.  10.Orthostatic blood pressure measurements showed no evidence of      orthostasis.  Blood pressure is 98/72 lying and 98/72 when the      patient has stood for 5 minutes.  The patient was seen in company      of Dr. Lewayne [name], electrophysiologist, who surveyed the      interrogation report by the [employer] tech, who questioned the      patient closely about his syncopal symptoms and the way he was      feeling at the time he lost consciousness a couple of weeks ago.      The patient has since recovered from any trauma sustained during      the procedure.  [name] family ask whether it was thought the      trauma may have dislodged the lead.  Dr. Ladona [name] did not think so.      He did say however that an event that could lead to syncope might      have been triggered by pacemaker malfunction, although it was not      likely, and even less likely now that the right ventricular lead  has been reprogrammed.  This defect in  the pacemaker has been      rectified, going from a bipolar to unipolar state.  Dr. Graciela Husbands      agrees with Dr. Ludwig Clarks decision to have a stress test done and      to follow-up closely pacemaker function when the patient is an      outpatient.  He wishes to follow-up with the patient at [facility]      in the future with serial interrogations.  Dr. Ladona [name] suggests that      Mr. Ramp follow-up with Dr. Dietrich [name] and with Dr. Ladona [name] the      same day and have interrogation of his pacemaker to make sure that      the right ventricular lead is still functioning well.   PATIENT'S MEDICATIONS:  1. Multivitamin daily.  2. Enalapril 10 mg daily.  3. Lexapro 10 mg daily.  4. Glipizide 10 mg daily.  5. Actos 45 mg daily.  6. Allegra 180 mg daily.  7. Enteric-coated aspirin 81 mg daily.  8. Metformin 1000 mg daily.  9. Levitra as needed.  10.Lovastatin 40 mg at bedtime.  11.The patient is also taking atenolol 50 mg daily.  12.The patient has been maintained on Lovenox 100 mg subcu every 12      hours during this admission      Maple Mirza, [place]      [name]. Nicholas Ridgel, MD  Electronically Signed    GM/MEDQ  D:  12/23/2006  T:  12/23/2006  Job:  [id number]

## 2011-02-05 NOTE — Letter (Signed)
October 28, 2006    Dr. Robb Matar  [facility]  [address]  [place], [place]  [address]   RE:  [name], [name]  MRN:  [id number]  /  DOB:  10/01/1960   Dear Dr. 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  Since his last visit, he has done fine.  He is asymptomatic from a  cardiac standpoint.  Medications are unchanged.   EXAMINATION:  Pleasant, squat gentleman in no acute distress.  The  weight is 220, stable.  Blood pressure 100/65, heart rate 66 and  regular, respirations 16.  NECK:  No jugular venous distention; normal carotid upstrokes without  bruit or transmitted murmur.  LUNGS:  Clear.  CARDIAC:  Grade 1-2/6 basilar systolic ejection murmur.  ABDOMEN:  Soft and nontender, normal bowel sounds.  EXTREMITIES:  No edema.   Echocardiogram:  Mild aortic stenosis; structurally normal tricuspid and  mitral valve with fairly impressive tricuspid regurgitation, but no  significant mitral regurgitation.  Normal left ventricular size with  mild hypertrophy and normal systolic function.   IMPRESSION:  [name] does not have mitral valve disease as  advertised, but predominantly very mild aortic stenosis with some  tricuspid regurgitation.  There is no significant pulmonary  hypertension.  It is unlikely that valvular heart disease will cause any  significant problems in the foreseeable future.  We will continue to  monitor this and his pacemaker.  I will plan to see him again in one  year.    Sincerely,      Gerrit Friends. Dietrich Pates, MD, [facility]  Electronically Signed    RMR/MedQ  DD: 10/28/2006  DT: 10/28/2006  Job #: [id number]

## 2011-02-05 NOTE — H&P (Signed)
NAME:  [name], [name] NO.:  [id number]   MEDICAL RECORD NO.:  [id number]          PATIENT TYPE:  EMS   LOCATION:  MAJO                         FACILITY:  [facility]   PHYSICIAN:  Noralyn Pick. Eden Emms, MD, FACCDATE OF BIRTH:  [birth year]   DATE OF ADMISSION:  12/22/2006  DATE OF DISCHARGE:                              HISTORY & PHYSICAL   PRIMARY CARDIOLOGIST:  Gerrit Friends. Dietrich Pates, MD, [facility].  Formerly cared for  by Dr. Hyacinth Meeker in [place], [place].   PRIMARY CARE PHYSICIAN:  Dr. Robb Matar at [facility].  Mr. Kortz is a very pleasant 50 year old Caucasian  gentleman, previously followed for cardiac purposes in [place],  [place].  He is status post permanent pacemaker originally put in 1995,  also status post cardiac catheterization in 1998, that was normal per  the patient's wife.  Mr. Summer has a long-standing history of  deafness, but speaks well and reads lips well.  His wife also is very  helpful in relaying information for him.  He presented to [facility]  Emergency Room via EMS today complaining of chest discomfort.  Apparently, the around 8:30 this morning he was out feeding his dogs.  He came back in and told his wife he did not feel good.  She states he  became clammy and somewhat pale.  He complained of sharp pain above his  left breast.  It was a very localized area.  He is unable to comprehend  rating significance of his chest discomfort on a scale of 1 to 10.  He  just describes it as a sharp intense pain.  It was worse with a deep  breath.  It currently is worse with a deep breath.  He states it hurts  worse if he moves.  It also hurts worse when I palpated it.  He has  received nitroglycerin sublingually in the ER; he states this helped  some.  He has also received some morphine; he states this helps some  also.  Apparently 2 weeks ago, he had a syncopal episode walking out of  the frontal door of his home.  He fell face  down on to a hard surface.  He complained of everything blacking out prior to falling.  Apparently,  he was worked up at [employer].  A CT of the head at that  time showed no acute findings.  Chest x-ray showed no acute findings.  He did have some laceration to his left outer hand, also some bruising  to his chest and forehead.  He states he has had couple of presyncopal  episodes since that time, but has not completely passed out again.   ALLERGIES:  PENICILLIN.   CURRENT MEDICATIONS:  1. Multivitamin.  2. Enalapril 10.  3. Lexapro 10.  4. Glipizide 10.  5. Actos 45 mL.  6. Allegra 180.  7. Aspirin 81.  8. Metformin 1,000 mg daily.  9. Levitra p.r.n.  10.Lovastatin 40.  11.Inhaler which he states he received last month for a head cold.  12.He was also taking Coricidin prior to his syncopal episode  for his      cold.  13.He is also on atenolol 50 mg daily.  14.Insulin 70/30, he uses 16 units subcutaneous p.r.n. for a blood      sugar greater than 160.   Note the patient's parents are first cousins.   PAST MEDICAL HISTORY:  1. He has a longstanding history of deafness.  2. GERD, status post multiple dilatations for esophageal strictures.  3. 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      This apparently was done at [facility], when the patient was 50      years old.  4. BPH, status post questionable TURP.  5. Obstructive sleep apnea with compliance with CPAP.  6. Insulin-dependent diabetes.  7. A history of EtOH use greater than 15 years ago.  8. Questionable history of asthma.  9. History of hypertension.  10.Dyslipidemia.  11.Permanent pacemaker, the exact cause is unknown.  It just says he      has a history of conduction system disease.  12.He also reportedly underwent cardiac catheterization in 1998, that      wife states he was told was normal.  Echocardiogram done in January      this year by Dr. Dietrich Pates shows mild RVH,  moderate aortic valvular      sclerosis, mild calcification of the wall of the proximal ascending      aorta with minimal aortic stenosis, tricuspid with substantial      regurgitation, normal left ventricular size, mild hypertrophy,      normal regional and global function.  13.Status post tonsillectomy.   SOCIAL HISTORY:  The patient lives in [place] with his wife.  They  have no children.  Denies tobacco use, current alcohol use, drugs or  herbal medications.  He works for [employer] in [place].  Apparently, he helps assemble ink pens.   FAMILY HISTORY:  Mother alive with COPD and hypertension.  Father  deceased age 39 secondary to complications of pneumonia and an MI.  Siblings:  He has a brother with known coronary artery disease, a sister  with coronary artery disease.   REVIEW OF SYSTEMS:  Positive for chills, sweats, headache, chest pain,  dyspnea on exertion, mild peripheral edema, presyncope, syncopal episode  as stated above, coughing and wheezing, numbness in left leg and foot at  times, and GERD symptoms.   PHYSICAL EXAMINATION:  VITAL SIGNS:  Temperature 97.8, pulse of 62,  respirations 17, blood pressure 115/75, sat 94% on 2 liters.  He is 223  pounds at 5 feet 3 inches.  GENERAL:  He is in no acute distress, very pleasant, cooperative  gentleman.  HEENT:  Normocephalic, atraumatic.  Pupils equal, round, react to light.  Sclerae is clear.  NECK:  Supple without lymphadenopathy, no bruit.  Difficult to assess  for JVD secondary to body habitus.  CARDIOVASCULAR:  S1, S2.  A 2/6 systolic ejection murmur.  Heart sounds  are somewhat distant.  LUNGS:  He has fine expiratory wheezes bilaterally with poor expiratory  effort.  Otherwise, clear to auscultation.  SKIN:  Warm and dry.  ABDOMEN:  Soft, nontender, positive bowel sounds.  LOWER EXTREMITIES:  Without clubbing, cyanosis, or edema.  He has 2+ DPs bilaterally with signs of chronic venous  stasis.  NEUROLOGIC:  He is alert and oriented x3.   A chest x-ray showed cardiomegaly without acute cardiomegaly findings.  EKG:  Ventricular paced, rate of 62.   LABORATORY:  Work shows an H&H  13.2, 39.6, WBCs 9.8, platelets to  236,000.  Sodium 136, potassium 4.3, BUN 17, creatinine 1.2.  Point-of-  care markers:  Troponin less than 0.05 with a CK-MB mildly elevated at  9.4.  PT 14.9, INR 1.1.   Dr. Maurine Cane in to examine and assess patient with complaints of the  chest discomfort appearing somewhat atypical at this time.  However, in  the setting of recent of a syncopal episode will have device  interrogated, also check an EEG, will hold off on CT of the head as the  patient just had one after a syncopal episode at [facility].  We will cycle cardiac markers, keep the patient overnight for  observation.  Plan on doing an adenosine Myoview in the morning if  enzymes are negative.  Continue current medications with the exception  of his metformin for now.  Check other the baseline lab work.  We will  use Vicodin for pain at this time, however, prophylactically  anticoagulate with Lovenox.      Dorian Pod, ACNP      Noralyn Pick. Eden Emms, MD, [facility]  Electronically Signed    MB/MEDQ  D:  12/22/2006  T:  12/22/2006  Job:  [id number]   cc:   [place], Dr.

## 2011-02-05 NOTE — Procedures (Signed)
NAME:  [name], [name] NO.:  [id number]   MEDICAL RECORD NO.:  [id number]          PATIENT TYPE:  OUT   LOCATION:  RAD                           FACILITY:  [facility]   PHYSICIAN:  Gerrit Friends. Dietrich Pates, MD, FACCDATE OF BIRTH:  [birth year]   DATE OF PROCEDURE:  DATE OF DISCHARGE:  09/30/2006                                ECHOCARDIOGRAM   REFERRING PHYSICIAN:  Dr. Junie Bame, [facility] and Dr.  Dietrich Pates.   CRITICAL DATA:  A 50 year old gentleman with mitral regurgitation; prior  permanent pacemaker implantation.  History of hypertension.  M-mode  aorta 2.80, left atrium 4.0, septum 1.4, posterior wall 1.3, LV diastole  5.0, LV systole 4.1.   1. Technically somewhat difficult and limited echocardiographic study.  2. Left atrial size at the upper limit of normal; normal right atrium      and right ventricle; mild RVH; pacing wire traverses the right      heart.  3. Moderate aortic valvular sclerosis; mild calcification of the wall      of the proximal ascending aorta; minimal aortic stenosis.  4. Normal tricuspid valve; substantial regurgitation; normal estimated      RV systolic pressure.  5. Pulmonic valve and proximal pulmonary artery suboptimally imaged,      but appear normal.  6. Normal mitral valve; mild annular calcification; no significant      regurgitation.  7. Normal left ventricular size; mild hypertrophy; normal regional and      global function.  8. IVC at the upper limit of normal in diameter; size decreases      normally with inspiration.      Gerrit Friends. Dietrich Pates, MD, [facility]  Electronically Signed     RMR/MEDQ  D:  10/01/2006  T:  10/02/2006  Job:  [id number]

## 2011-02-05 NOTE — Discharge Summary (Signed)
[name], [name] NO.:  [id number]   MEDICAL RECORD NO.:  [id number]          PATIENT TYPE:  INP   LOCATION:  2036                         FACILITY:  [facility]   PHYSICIAN:  Doylene Canning. Ladona Ridgel, MD    DATE OF BIRTH:  1960/11/02   DATE OF ADMISSION:  12/22/2006  DATE OF DISCHARGE:                               DISCHARGE SUMMARY   ALLERGIES:  MR. Safranek HAS AN ALLERGY TO PENICILLIN.   PRINCIPAL DIAGNOSES:  1. Admitted with atypical chest pain.      a.     Troponin I studies are less than 0.05 then 0.03 then 0.03.      b.     Electrocardiogram nondiagnostic.  2. Syncope of unclear etiology 2 weeks prior to this admission.      a.     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  3. Repeated emesis as part of personal history possibly secondary to      diabetic gastroparesis.  4. A 2-D echocardiogram January 2008 left ventricular function normal.   SECONDARY DIAGNOSES:  1. 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  The pacemaker is a Medtronic EnPulse DR ED [phone]      dual-chamber pacemaker implanted by Dr. Daryel November in [place],      [place].  The patient not been seen there the last 3 years.  2. Hypertension.  3. Type 2 diabetes with sliding scale insulin adjustment.  4. Dyslipidemia.  5. Gastroesophageal reflux disease/history of esophageal      strictures/status post dilatation.  6. Benign prostatic hypertrophy.  7. Obstructive sleep apnea/bilevel (biphasic) positive airway      pressure.  8. 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  9. Status post transurethral resection of the  prostate.   The patient is not orthostatic on serial orthostatic blood pressure  measurements this hospitalization.   PROCEDURES:  Rule out myocardial infarction this admission.   BRIEF HISTORY:  [name] is a 50 year old male who is a patient of  Dr. Dietrich Pates at the [place] office of [facility].  He  presents to [facility] Emergency Room December 22, 2006, having been brought  there by emergency medical services.  In the morning of January 18, 2007,  he was out feeding the dogs.  When he came in, he did not feel well.  He  complained of chest pain.  He had sharp pain in the left breast slightly  left of the sternum, it is worse with a deep breath and it hurts to  palpation.  Apparently with these complaints he was clammy and pale.   Two weeks ago the patient and his wife were going out for  a morning  walk.  The patient was about to walk down the front steps when he got  dizzy, fell out the front door, fell on his chest with his face down.  He awoke slightly dizzy.  He says that he has no warning except for  slight period of dizziness.  He did not lose continence of urine or  bowel.  He had no postictal symptoms and no convulsive activity.  He was  seen [facility].  Chest CT of the head was negative.  The patient will  be admitted through the emergency room.  He will may be maintained on  his outpatient medications.  He will have cardiac enzymes cycled.  Laboratory studies including BNP and TSH will be obtained.  He will be  maintained on BiPAP overnight.  He will be seen this admission by the  electrophysiologist in consult.  In addition, his pacemaker will be  interrogated.   HOSPITAL COURSE:  1. The patient admitted on December 21, 2006, through the emergency room      with a complaint of chest pain which is sharp, left-sided, worse on      inspiration, painful to palpation just left of the sternum.  The      patient has had no radiation of the pain, no nausea, somewhat       clammy and pale when he came in from the ER on the morning of his      admission.  He had his device interrogated by the [employer]      Nature conservation officer.  The study showed that the right      ventricular lead had impedance greater than 2600 ohms.  The pacer      was in DDDR mode.  The bipolar configuration was changed to a      unipolar configuration and the impedance decreased to 432 ohms with      a threshold of 0.5 volts and 0.4 milliseconds.  It was reprogrammed      in the unipolar mode in both the pacing and the sensing modes.      Interrogation of the device showed no arrhythmias noted dating back      to the period when he would of had a syncopal event.  [name]      chest pain has abated somewhat during his hospitalization.  His      cardiac enzymes have all been negative less than 0.05 then 0.03      then 0.03.  His labs as will be dictated later are all within      normal limits.  He did have some emesis periods during this      hospitalization after eating and Reglan has been started on a twice-      daily basis.  Orthostatic blood pressure measurements have been      taken.  Lying they were 98/72, standing after 5 minutes 98/72,      representing no significant drop, rate is not increased to either      and was 85 beats per minute in both readings.  He was seen on      hospital day #1 by Dr. Jens Som.  He recommended to have inpatient      Myoview study for December 24, 2006, however the schedule was full and      after further discussion, Dr. Jens Som elected to send the patient      home with an outpatient Myoview.  He was talking  this over with Dr.      Ladona Ridgel as well.  Dr. Ladona Ridgel after seeing the patient and looking at      the interrogation report and after thorough discussion with the      patient concerning his symptoms at the time of the syncope several      weeks ago thought that there was a scant but possible chance that     the pacemaker  ventricular lead might have led to this dizzy spell      with syncope, however, this has been rectified by reprogramming at      the time of his admission in the emergency room to a unipolar      configuration.  Dr. Ladona Ridgel recommends serial followup to make sure      that the pacemaker continues to function adequately.  Dr. Ladona Ridgel      also feels a Myoview study would be okay as an outpatient.  With      all this in mind, followup at the [facility] office of [facility].  A stress test is planned for Friday, December 30, 2006,      at 9:15.  The patient is urged to eat nothing after midnight      Thursday, December 29, 2006, and to present to the short-stay center      at [facility] Friday, December 30, 2006.  2. He will see Dr. Dietrich Pates at the [facility] office of [facility] Wednesday, January 04, 2007, at 2:30.  3. He will see Dr. Ladona Ridgel for pacemaker check Wednesday, February 22, 2007,      at 3:30.   MEDICATIONS:  1. The patient discharges essentially on his preadmission medications      with the addition of Reglan 10 mg 1 tablet 30 minutes before      breakfast and 1 tablet 30 minutes before dinner.  2. Metformin 500 mg 2 tablets in the morning, 2 tablets in the      evening.  3. Atenolol 50 mg daily.  4. Actos 45 mg daily.  5. Fexofenadine 180 mg daily.  6. Enalapril 10 mg daily.  7. Glipizide ER 10 mg daily.  8. Lexapro 10 mg daily.  9. Enteric-coated aspirin 81 mg daily.  10.Multivitamin daily.  11.Levitra 10 mg as needed.  12.Novolin 70/30 16 units in the morning as needed per sliding scale.   Patient is urged to continue wearing his BiPAP device at night with  oxygen bled in at 3 liters.   LABORATORIES THIS ADMISSION:  PT was 14.9, INR 1.1, PTT was 39.  Serum  electrolytes: Sodium 136, potassium 4.3, chloride 102, carbonate 29, BUN  is 17, creatinine 1.2, glucose is 248, white cells 9.8, hemoglobin 13.2,  hematocrit 39.6 and platelets of 236.   Urinalysis was negative.  The BNP  was 102.   Once again, Dr. Ladona Ridgel feels that if he had transient heart block and  ventricular non-capture the patient might have had brief asystole  causing syncopal event; however, unipolar pacing is working fine with  reprogramming.  The patient will have outpatient stress test and  outpatient device clinic followup.      Maple Mirza, PA      Doylene Canning. Ladona Ridgel, MD  Electronically Signed    GM/MEDQ  D:  12/23/2006  T:  12/23/2006  Job:  [id number]   cc:   Gerrit Friends. Dietrich Pates, MD, [facility]  Sharlot Gowda  Myna Hidalgo, MD  Dr. Rinaldo Cloud

## 2011-02-05 NOTE — Letter (Signed)
September 30, 2006    Dr. Robb Matar  [facility]  [address]  [place], [place]  [address]   RE:  [name], [name]  MRN:  [id number]  /  DOB:  [birth year]   Dear Dr. Robb Matar:   It was my pleasure evaluating [name] in consultation today in the  office at your request.  As you know, this nice gentleman has a history  of conduction system disease, and required implantation of a permanent  pacemaker in 1995.  He underwent a generator change approximately 2  years ago, and has had no cardiopulmonary symptoms since.  He remotely  underwent cardiac catheterization, reportedly with normal results.  We  are seeking medical records from Dr. Earna Coder, his previous cardiologist.   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  He has a history of GERD, and has required multiple dilatations  for esophageal stricture.  There is a remote history of orchiectomy for  neoplastic disease.  This surgery was performed more than 15 years ago,  at [facility].  The details are not available.   He previously developed urinary retention, presumably due to BPH, and  required a temporary in-dwelling Foley, and then presumably a TURP.  He  has had multiple cardiovascular risk factors, including hypertension,  insulin-requiring diabetes, and dyslipidemia.  He has a history of sleep  apnea that is treated with BiPAP.  He remotely underwent tonsillectomy.   CURRENT MEDICATIONS:  1. Insulin 70/30 per your directions.  2. Metformin 2 g daily.  3. Glipizide 10 mg daily.  4. Actos 45 mg daily.  5. Lovastatin 40 mg daily.  6. Aspirin 81 mg daily.  7. Enalapril 10 mg daily  8. Atenolol 50 mg daily.  9. Allegra 180 mg daily.  10.Lexapro 10 mg daily.   The patient reports an allergy to PENICILLIN.   SOCIAL HISTORY:  Works for [employer], sedentary lifestyle.  No  history of tobacco nor alcohol use.   FAMILY HISTORY:  Father died at  age 72 with pneumonia, mother is alive,  but has a history of COPD and hypertension.  Of 3 siblings, 1 has  cardiac problems.   REVIEW OF SYSTEMS:  Notable for the need for corrective lenses,  bilateral hearing aids with profound hearing loss, urinary frequency,  arthritic discomfort in the knees, and intermittent pedal edema.   PHYSICAL EXAMINATION:  Pleasant gentleman in no acute distress.  Weight  is 219, blood pressure 120/80, heart rate is 68 and regular,  respirations 14.  HEENT:  Anicteric sclerae, normal lens and conjunctivae, normal oral  mucosa.  Webbed neck with no bruits and no jugular venous distension.  ENDOCRINE:  No thyromegaly.  HEMATOPOIETIC:  No adenopathy.  SKIN:  No significant lesions.  LUNGS:  Clear.  CARDIAC:  Normal 1st and 2nd heart sounds, a grade 2/6 holosystolic  apical murmur.  ABDOMEN:  Soft and nontender.  Normal bowel sounds.  No masses, no  organomegaly.  EXTREMITIES:  Trace edema, distal pulses intact.  NEUROMUSCULAR:  Symmetric strength and tone.  Normal cranial nerves.   IMPRESSION:  [name] is doing generally well with respect to his  cardiac issues.  We will obtain an echocardiogram to evaluate the  severity of mitral regurgitation, and to verify that his left ventricle  is tolerating the volume load.  His pacemaker was last assessed 1 month  ago.  He has no telemetry device.  Due to  the fact that this is a fairly  new implant, he will not require  telephone verification of function over the next year, and can be  followed by our electrophysiology specialists thereafter.  We will  attempt to obtain more detailed records from Dr. Earna Coder, and will plan  to reassess this nice gentleman in 1 month.  His medical regimen appears  optimal.    Sincerely,      Gerrit Friends. Dietrich Pates, MD, [facility]  Electronically Signed    RMR/MedQ  DD: 09/30/2006  DT: 10/01/2006  Job #: [id number]

## 2011-02-05 NOTE — H&P (Signed)
NAME:  [name], [name] NO.:  [id number]   MEDICAL RECORD NO.:  [id number]          PATIENT TYPE:  AMB   LOCATION:  DAY                           FACILITY:  [facility]   PHYSICIAN:  Tilford Pillar, MD      DATE OF BIRTH:  [birth year]   DATE OF ADMISSION:  DATE OF DISCHARGE:  LH                              HISTORY & PHYSICAL   CHIEF COMPLAINT:  Referred for liver problems.   HISTORY OF PRESENT ILLNESS:  The patient is a 50 year old male known to  me by previous evaluations for a pelvic mass.  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  He is married and his wife is currently present with him who  helps with some of the patient's history.  The patient apparently has  been referred for recent changes in his hepatic studies.  He has not had  any symptomatology.  No right upper quadrant pain.  No history of  jaundice.  He is followed closely by Dr. Jena Gauss, Gastroenterology, and  due to these changes and suspicious of continued fatty hepatic changes  they recommended obtaining a tissue biopsy.  The patient has had no  nausea and vomiting.  Urinary issues have been controlled and the  patient continues to follow with his urology syndrome.   PAST MEDICAL HISTORY:  1. History of urinary retention.  2. History of repaired hypospadias.  3. The patient has had bilateral orchiectomy.  4. Diabetes mellitus type 2.  5. History of bradycardia with pacemaker placement.  6. Hyperlipidemia.  7. Hypertension.   PAST SURGICAL HISTORY:  Bilateral orchiectomy, tonsil and  adenoidectomies, and sinus surgery.   MEDICATIONS:  Again reviewed with the patient.   ALLERGIES:  PENICILLIN.   SOCIAL HISTORY:  No tobacco.  No alcohol.  No recreational drug use.  He  does live with his wife who also has slight MRDD.   REVIEW OF SYSTEMS:  CONSTITUTIONAL:  Unremarkable.  EYES:  Unremarkable.  EARS, NOSE, AND THROAT:  He does have some hearing loss, but does have a  hearing aid.  RESPIRATORY:  Unremarkable.   CARDIOVASCULAR:  Unremarkable.  GASTROINTESTINAL:  Unremarkable.  GENITOURINARY:  Occasional urinary voiding issues.  MUSCULOSKELETAL:  Unremarkable.  SKIN:  Unremarkable.  ENDOCRINE:  Unremarkable.  NEURO:  Unremarkable.   PHYSICAL EXAMINATION:  GENERAL:  The patient is obese.  He is alert and  oriented x3.  HEENT:  Scalp no deformities.  No masses.  Eyes:  Pupils equal, round,  and reactive.  Extraocular movements are intact.  No scleral icterus or  conjunctival pallor is noted.  Oral mucosa is pink.  Normal occlusion.  NECK:  Trachea is midline.  PULMONARY:  Unlabored respiration.  No wheezes.  No crackles.  He is  clear to auscultation bilaterally.  CARDIOVASCULAR:  Regular rate and rhythm.  No murmurs.  ABDOMINAL:  Positive bowel sounds.  Soft, nontender.  No hernias.  No  masses are apparent.  EXTREMITIES:  Warm and dry.   The patient had a CT scan on March 18, 2008, which demonstrated sizeable  change in the pelvic mass.  It did demonstrate elevation of fatty  hepatic changes.   ASSESSMENT AND PLAN:  Fatty liver changes.  At this time, I did discuss  with the patient the reasons for proceeding with a biopsy.  They both  desired to not proceed with excessive medical evaluation.  I did discuss  the risks, benefits, and alternatives to proceeding with a biopsy.  All  their questions and concerns were addressed and they do wish to proceed  with a biopsy at this time.  I did discuss all options of a CT-guided  percutaneous biopsy versus a laparoscopic biopsy.  At this time, the  patient states he would prefer to proceed with laparoscopic biopsy  understanding the risk of bleeding, infection, the possibility of bowel  injury as well as possibility of intraoperative cardiac and pulmonary  events.  At this time, we will proceed at the patient's earliest  convenience.  I also discussed with them my recommendations for a repeat  CT evaluation of the abdomen and pelvis within the next  month or 2 for  reevaluation to further evaluate the pelvic mass, again stressing that  there is no significant change with this that at this time we would able  to minimize radiographic evaluation of this.  At this time, we will plan  to proceed with the biopsy of the liver laparoscopically at the  patient's earliest convenience.       Tilford Pillar, MD  Electronically Signed     BZ/MEDQ  D:  09/05/2008  T:  09/06/2008  Job:  [id number]   cc:   Short-Stay Surgery   Patient's Primary Care

## 2011-02-05 NOTE — Procedures (Signed)
EEG NUMBER:  [id number]   REFERRING PHYSICIAN:  Noralyn Pick. Eden Emms, MD, [facility]   This is a 50 year old man who was admitted with syncopal episode and  chest pain.   This is a routine 17-channel EEG with one channel devoted to EKG  utilizing [employer] 10/20 lead placement system.  The patient was  described as being awake and alert clinically.   His medications include morphine, nitroglycerin, Protonix, Zofran,  Actos, Allegra, aspirin, atenolol, enalapril, glipizide, Levitra,  Lexapro, lovastatin, and insulin.   The background consisted of a well-organized, well-developed, well  modulated, but low-amplitude 8 Hz alpha activity with a lot of muscle  artifact associated with it.  No interhemispheric asymmetry is  identified, and no definite epileptiform discharges are seen.  Activation procedures included photic stimulation, which was performed  but did not produce any change in background activity.  Hyperventilation  was not performed.  The EKG reveals relatively regular rhythm with what  may be pacer spikes. The rate is approximately 60 beats per minute.   CONCLUSION:  Normal EEG without focal abnormality or seizure activity  during the course of today's recording.  Clinical correlation is  recommended.      Catherine A. Orlin Hilding, M.D.  Electronically Signed     EAV:[id number]  D:  12/23/2006 16:10:38  T:  12/24/2006 15:50:53  Job #:  [id number]   cc:   Noralyn Pick. Eden Emms, MD, [facility]  [address]  [address]  [place]  [place] [address]

## 2011-02-18 ENCOUNTER — Other Ambulatory Visit: Payer: Self-pay

## 2011-02-18 MED ORDER — OMEPRAZOLE 40 MG PO CPDR: 40.0000 mg | DELAYED_RELEASE_CAPSULE | Freq: Every day | ORAL | Status: DC

## 2011-04-08 ENCOUNTER — Encounter: Payer: Self-pay | Admitting: Cardiology

## 2011-04-13 ENCOUNTER — Encounter: Payer: Self-pay | Admitting: Cardiology

## 2011-04-13 ENCOUNTER — Ambulatory Visit (INDEPENDENT_AMBULATORY_CARE_PROVIDER_SITE_OTHER): Payer: PRIVATE HEALTH INSURANCE | Admitting: Cardiology

## 2011-04-13 DIAGNOSIS — E119 Type 2 diabetes mellitus without complications: Secondary | ICD-10-CM

## 2011-04-13 DIAGNOSIS — R1900 Intra-abdominal and pelvic swelling, mass and lump, unspecified site: Secondary | ICD-10-CM

## 2011-04-13 DIAGNOSIS — I359 Nonrheumatic aortic valve disorder, unspecified: Secondary | ICD-10-CM

## 2011-04-13 DIAGNOSIS — G4730 Sleep apnea, unspecified: Secondary | ICD-10-CM | POA: Insufficient documentation

## 2011-04-13 DIAGNOSIS — R188 Other ascites: Secondary | ICD-10-CM | POA: Insufficient documentation

## 2011-04-13 DIAGNOSIS — E785 Hyperlipidemia, unspecified: Secondary | ICD-10-CM

## 2011-04-13 DIAGNOSIS — I350 Nonrheumatic aortic (valve) stenosis: Secondary | ICD-10-CM | POA: Insufficient documentation

## 2011-04-13 DIAGNOSIS — R079 Chest pain, unspecified: Secondary | ICD-10-CM | POA: Insufficient documentation

## 2011-04-13 DIAGNOSIS — K769 Liver disease, unspecified: Secondary | ICD-10-CM

## 2011-04-13 DIAGNOSIS — I10 Essential (primary) hypertension: Secondary | ICD-10-CM

## 2011-04-13 DIAGNOSIS — N400 Enlarged prostate without lower urinary tract symptoms: Secondary | ICD-10-CM | POA: Insufficient documentation

## 2011-04-13 DIAGNOSIS — I459 Conduction disorder, unspecified: Secondary | ICD-10-CM

## 2011-04-13 DIAGNOSIS — D649 Anemia, unspecified: Secondary | ICD-10-CM

## 2011-04-13 NOTE — Progress Notes (Signed)
HPI : Mr. Goswami returns to the office as scheduled for continued assessment and treatment of conduction system disease, mild aortic stenosis and recurrent chest discomfort in the setting of multiple cardiovascular risk factors but normal coronary angiography in 2009.  He was admitted to [facility] a few months ago with chest discomfort and pacemaker dysfunction that was apparently asymptomatic.  Hospital records were retrieved and reviewed.  He experienced an episode of syncope some weeks before, but this was not clearly attributable to arrhythmia.  He has chronic hypoxemia, is followed by a pulmonologist and is maintained on home oxygen, which he does not always use.  His wife wonders whether his loss of consciousness was due to hypoxia and a reaction to heat and dehydration while he was working outside.  Was recently treated with a course of antibiotics for bilateral lower extremity cellulitis.   Current Outpatient Prescriptions on File Prior to Visit  Medication Sig Dispense Refill  . amLODipine (NORVASC) 2.5 MG tablet Take 2.5 mg by mouth daily.        Marland Kitchen aspirin 81 MG tablet Take 81 mg by mouth daily.        Marland Kitchen atenolol (TENORMIN) 50 MG tablet Take 50 mg by mouth daily.        . Ca Carbonate-Mag Hydroxide (MYLANTA SUPREME) 400-135 MG/5ML SUSP Take by mouth as needed.        Marland Kitchen glipiZIDE (GLUCOTROL) 5 MG tablet Take 5 mg by mouth daily.        . Glucosamine-Chondroitin 1500-1200 MG/30ML LIQD Take by mouth daily.        Marland Kitchen levothyroxine (LEVOXYL) 50 MCG tablet Take 50 mcg by mouth daily.        Marland Kitchen loratadine (CLARITIN) 10 MG tablet Take 10 mg by mouth daily.        . mometasone (NASONEX) 50 MCG/ACT nasal spray Place 2 sprays into the nose daily.        . Multiple Vitamins-Minerals (CENTRUM) tablet Take 1 tablet by mouth daily.        . nitroGLYCERIN (NITROSTAT) 0.4 MG SL tablet Place 0.4 mg under the tongue every 5 (five) minutes as needed.        Marland Kitchen omeprazole (PRILOSEC) 40 MG capsule  Take 1 capsule (40 mg total) by mouth daily.  30 capsule  1  . pioglitazone (ACTOS) 45 MG tablet Take 45 mg by mouth daily.           Allergies  Allergen Reactions  . Penicillins       Past medical history, social history, and family history reviewed and updated.  ROS: Denies orthopnea, PND and pedal edema.  PHYSICAL EXAM: BP 130/84  Pulse 77  Ht 5\' 5"  (1.651 m)  Wt 97.07 kg (214 lb)  BMI 35.61 kg/m2  SpO2 89%  General-Well developed; no acute distress Body habitus-mildly obese Neck-No JVD; left carotid bruit vs transmitted murmur Lungs-clear lung fields; resonant to percussion; decreased breath sounds at the bases Cardiovascular-normal PMI; distant S1 and decreased S2; grade 2/6 harsh systolic ejection murmur at the cardiac base Abdomen-normal bowel sounds; soft and non-tender without masses or organomegaly Musculoskeletal-No deformities, no cyanosis or clubbing Neurologic-Normal cranial nerves; symmetric strength and tone Skin-Warm, no significant lesions Extremities-distal pulses intact; 1/2+ ankle edema; increased pigmentation over the lower legs anteriorly  ASSESSMENT AND PLAN:

## 2011-04-13 NOTE — Assessment & Plan Note (Signed)
With minimal stenosis suggested by catheterization less than 3 years ago, repeat imaging will not be performed for at least another 12 months.

## 2011-04-13 NOTE — Patient Instructions (Addendum)
**Note De-identified Wenzel Backlund Obfuscation** Your physician recommends that you continue on your current medications as directed. Please refer to the Current Medication list given to you today.  Your physician recommends that you schedule a follow-up appointment in: 6 months  

## 2011-04-13 NOTE — Assessment & Plan Note (Addendum)
Diabetic control was excellent when last assessed approximately one year ago.  Risk of coronary disease is significant in the setting of diabetes and multiple additional contributing factors.  Negative coronary angiography 3 years ago it is somewhat reassuring, but does not protect against future development of disease.  Despite mild LFT abnormalities, if significant hyperlipidemia is present, I would be inclined to proceed with statin therapy.  A repeat lipid profile is pending.

## 2011-04-13 NOTE — Assessment & Plan Note (Signed)
As above, the advisability of treatment with statin will be reassessed.  A repeat lipid profile is pending.

## 2011-04-13 NOTE — Assessment & Plan Note (Signed)
A repeat hepatic profile is pending.

## 2011-04-13 NOTE — Assessment & Plan Note (Signed)
Ventricular lead was not sensing properly when assessed during recent hospital admission and was reprogrammed to the unipolar mode.  Reassessment in the office was planned at the time of hospital discharge and will be scheduled.

## 2011-04-14 NOTE — Progress Notes (Signed)
**Note De-Identified Trivia Heffelfinger Obfuscation** Recent lab results requested and received from Dr. Leticia Penna office and records requested and received from Dr. Burnard Leigh office. Results placed in Dr. Marvel Plan reports folder for his review./LV

## 2011-04-15 ENCOUNTER — Encounter: Payer: Self-pay | Admitting: Cardiology

## 2011-04-15 DIAGNOSIS — J45909 Unspecified asthma, uncomplicated: Secondary | ICD-10-CM | POA: Insufficient documentation

## 2011-04-19 ENCOUNTER — Encounter: Payer: Self-pay | Admitting: Cardiology

## 2011-04-26 ENCOUNTER — Other Ambulatory Visit: Payer: Self-pay | Admitting: *Deleted

## 2011-04-26 MED ORDER — AMLODIPINE BESYLATE 2.5 MG PO TABS: 2.5000 mg | ORAL_TABLET | Freq: Every day | ORAL | Status: DC

## 2011-06-10 ENCOUNTER — Encounter: Payer: Self-pay | Admitting: Internal Medicine

## 2011-06-10 LAB — DIFFERENTIAL
Basophils Absolute: 0
Basophils Relative: 0
Eosinophils Absolute: 0
Monocytes Relative: 12
Neutro Abs: 12.7 — ABNORMAL HIGH
Neutrophils Relative %: 80 — ABNORMAL HIGH

## 2011-06-10 LAB — CBC
MCHC: 32.7
MCV: 85.2
Platelets: 319
RDW: 18.9 — ABNORMAL HIGH

## 2011-06-10 LAB — PROTIME-INR: INR: 1.3

## 2011-06-11 ENCOUNTER — Encounter: Payer: Self-pay | Admitting: Internal Medicine

## 2011-06-11 ENCOUNTER — Ambulatory Visit (INDEPENDENT_AMBULATORY_CARE_PROVIDER_SITE_OTHER): Payer: PRIVATE HEALTH INSURANCE | Admitting: Internal Medicine

## 2011-06-11 DIAGNOSIS — I10 Essential (primary) hypertension: Secondary | ICD-10-CM

## 2011-06-11 DIAGNOSIS — Z950 Presence of cardiac pacemaker: Secondary | ICD-10-CM

## 2011-06-11 DIAGNOSIS — I498 Other specified cardiac arrhythmias: Secondary | ICD-10-CM

## 2011-06-11 DIAGNOSIS — E785 Hyperlipidemia, unspecified: Secondary | ICD-10-CM

## 2011-06-11 LAB — PACEMAKER DEVICE OBSERVATION
AL AMPLITUDE: 2.8 mv
BAMS-0001: 175 {beats}/min
BATTERY VOLTAGE: 2.75 V
RV LEAD AMPLITUDE: 8 mv
RV LEAD THRESHOLD: 0.75 V

## 2011-06-11 NOTE — Assessment & Plan Note (Signed)
Blood pressure today is well controlled. He will continue his regular exercise program, and maintain a low-salt diet.

## 2011-06-11 NOTE — Assessment & Plan Note (Signed)
I have encouraged him to reduce his sodium and fat intake. He will continue his current medical therapy.

## 2011-06-11 NOTE — Assessment & Plan Note (Signed)
His device is working normally. We'll plan to recheck in several months. 

## 2011-06-11 NOTE — Progress Notes (Signed)
HPI [name] returned today for followup. He is a 51 year old man with a history of symptomatic bradycardia, hypertension, diastolic heart failure, diabetes, and dyslipidemia. The patient has done well in the interim. He denies palpitations, chest pain, or shortness of breath. He does admit to some peripheral edema and went question about his dietary history he admits to eating processed foods and foods that are high in sodium. He denies syncope. Allergies  Allergen Reactions  . Enalapril Other (See Comments)    Cough  . Penicillins      Current Outpatient Prescriptions  Medication Sig Dispense Refill  . albuterol (VENTOLIN HFA) 108 (90 BASE) MCG/ACT inhaler Inhale 2 puffs into the lungs every 6 (six) hours as needed.        Marland Kitchen amLODipine (NORVASC) 2.5 MG tablet Take 1 tablet (2.5 mg total) by mouth daily.  30 tablet  6  . aspirin 81 MG tablet Take 81 mg by mouth daily.        Marland Kitchen atenolol (TENORMIN) 50 MG tablet Take 50 mg by mouth daily.        . Ca Carbonate-Mag Hydroxide (MYLANTA SUPREME) 400-135 MG/5ML SUSP Take by mouth as needed.        Marland Kitchen glipiZIDE (GLUCOTROL) 5 MG tablet Take 5 mg by mouth daily.        . Glucosamine-Chondroitin 1500-1200 MG/30ML LIQD Take by mouth daily.        Marland Kitchen levothyroxine (LEVOXYL) 50 MCG tablet Take 50 mcg by mouth daily.        Marland Kitchen loratadine (CLARITIN) 10 MG tablet Take 10 mg by mouth daily.        . mometasone (NASONEX) 50 MCG/ACT nasal spray Place 2 sprays into the nose daily.        . Mometasone Furo-Formoterol Fum (DULERA) 200-5 MCG/ACT AERO Inhale 2 puffs into the lungs 2 (two) times daily.        . Multiple Vitamins-Minerals (CENTRUM) tablet Take 1 tablet by mouth daily.        . nitroGLYCERIN (NITROSTAT) 0.4 MG SL tablet Place 0.4 mg under the tongue every 5 (five) minutes as needed.        Marland Kitchen omeprazole (PRILOSEC) 40 MG capsule Take 1 capsule (40 mg total) by mouth daily.  30 capsule  1  . pioglitazone (ACTOS) 45 MG tablet Take 45 mg by mouth daily.            Past Medical History  Diagnosis Date  . Chest pain 2007, 2009    cardiac cath > normal coronary arterie; nl left ventricular function  . Cardiac conduction disorder     status post pacemaker implantation in 1995 generator replaced 2005  . Aortic stenosis     mild  . Mitral regurgitation     insignificant  . HTN (hypertension)   . Hyperlipidemia     statin discontinued due to abn LFTs  . Syncope   . Obesity   . Pelvic mass     identified in 2009, stable 2010  . 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 Diabetes mellitus     +Insulin  . Benign hypertrophy of prostate     with urinary retention; repaired hypospadia; transurethral resection of the prostate   . GERD (gastroesophageal reflux disease)     status post multiple dilatations for stricture  . Congenital deafness   . Sleep apnea     BiPAP and continuous oxygen  .  Obesity 4/08    BMI= 40  . Anemia   . Alcohol use     ROS:   All systems reviewed and negative except as noted in the HPI.   Past Surgical History  Procedure Date  . Orchiectomy 1990    bilateral; ?neoplasm  . Repair hypospadias w/ urethroplasty   . Transurethral resection of prostate     for BPH  . Tonsillectomy and adenoidectomy   . A-v cardiac pacemaker insertion 1995  . Colonoscopy 2008     Family History  Problem Relation Age of Onset  . COPD Mother   . Hypertension Mother   . Pneumonia Father   . Hypertension Other   . Heart disease Other      History   Social History  . Marital Status: Married    Spouse Name: N/A    Number of Children: N/A  . Years of Education: N/A   Occupational History  . Not on file.   Social History Main Topics  . Smoking status: Never Smoker   . Smokeless tobacco: Never Used   Comment: tobacco use- no   . Alcohol Use: No  . Drug Use: No  . Sexually Active: Not on file   Other Topics Concern  . Not on file   Social  History Narrative   Part time.      BP 116/81  Pulse 77  Resp 18  Ht 5\' 3"  (1.6 m)  Wt 212 lb 6.4 oz (96.344 kg)  BMI 37.62 kg/m2  SpO2 97%  Physical Exam:  Well appearing middle-age man, NAD HEENT: Unremarkable Neck:  No JVD, no thyromegally Lymphatics:  No adenopathy Back:  No CVA tenderness Lungs:  Clear. Well-healed pacemaker incision. HEART:  Regular rate rhythm, no murmurs, no rubs, no clicks Abd:  soft, positive bowel sounds, no organomegally, no rebound, no guarding Ext:  2 plus pulses, no edema, no cyanosis, no clubbing Skin:  No rashes no nodules Neuro:  CN II through XII intact, motor grossly intact  DEVICE  Normal device function.  See PaceArt for details.   Assess/Plan:

## 2011-06-11 NOTE — Patient Instructions (Signed)
Your physician recommends that you schedule a follow-up appointment in: 6 months for a devise check and in 1 year with Dr. Ladona Ridgel

## 2011-06-14 LAB — URINE CULTURE
Colony Count: 55000
Colony Count: 85000
Special Requests: POSITIVE

## 2011-06-14 LAB — URINALYSIS, ROUTINE W REFLEX MICROSCOPIC
Bilirubin Urine: NEGATIVE
Bilirubin Urine: NEGATIVE
Glucose, UA: NEGATIVE
Ketones, ur: NEGATIVE
Nitrite: NEGATIVE
Protein, ur: NEGATIVE
Protein, ur: NEGATIVE
Specific Gravity, Urine: >1.03 — ABNORMAL HIGH
Urobilinogen, UA: 0.2

## 2011-06-14 LAB — COMPREHENSIVE METABOLIC PANEL
AST: 46 — ABNORMAL HIGH
Albumin: 4.1
BUN: 30 — ABNORMAL HIGH
CO2: 21
Calcium: 9.2
Creatinine, Ser: 1.91 — ABNORMAL HIGH
GFR calc Af Amer: 46 — ABNORMAL LOW
GFR calc non Af Amer: 38 — ABNORMAL LOW

## 2011-06-14 LAB — CBC
HCT: 34.7 — ABNORMAL LOW
HCT: 36.7 — ABNORMAL LOW
MCHC: 32.7
MCHC: 32.8
MCV: 79.2
MCV: 79.3
Platelets: 356
Platelets: 464 — ABNORMAL HIGH
WBC: 15 — ABNORMAL HIGH

## 2011-06-14 LAB — BASIC METABOLIC PANEL
BUN: 23
CO2: 22
CO2: 25
Chloride: 101
Chloride: 97
Creatinine, Ser: 1.53 — ABNORMAL HIGH
Creatinine, Ser: 1.69 — ABNORMAL HIGH
GFR calc Af Amer: 53 — ABNORMAL LOW
Potassium: 4.4
Potassium: 4.4

## 2011-06-14 LAB — DIFFERENTIAL
Basophils Absolute: 0
Basophils Relative: 0
Eosinophils Absolute: 0.1
Eosinophils Relative: 0
Eosinophils Relative: 1
Lymphocytes Relative: 5 — ABNORMAL LOW
Lymphs Abs: 1.1
Lymphs Abs: 1.7
Monocytes Relative: 8
Neutro Abs: 20.9 — ABNORMAL HIGH
Neutrophils Relative %: 80 — ABNORMAL HIGH

## 2011-06-14 LAB — URINE MICROSCOPIC-ADD ON

## 2011-06-14 LAB — HEMOGLOBIN A1C
Hgb A1c MFr Bld: 7.1 — ABNORMAL HIGH
Mean Plasma Glucose: 175

## 2011-06-15 ENCOUNTER — Other Ambulatory Visit: Payer: Self-pay | Admitting: *Deleted

## 2011-06-15 LAB — BASIC METABOLIC PANEL
BUN: 18
BUN: 19
Calcium: 9.3
Calcium: 9.5
Chloride: 105
Creatinine, Ser: 1.14
Creatinine, Ser: 1.15
Creatinine, Ser: 1.29
GFR calc Af Amer: >60
GFR calc Af Amer: >60
GFR calc non Af Amer: >60
Glucose, Bld: 135 — ABNORMAL HIGH
Glucose, Bld: 142 — ABNORMAL HIGH
Potassium: 5.1
Sodium: 135

## 2011-06-15 LAB — CBC
HCT: 32 — ABNORMAL LOW
Hemoglobin: 10.9 — ABNORMAL LOW
MCV: 79.1
MCV: 79.1
Platelets: 255
Platelets: 267
RBC: 4.02 — ABNORMAL LOW
RBC: 4.19 — ABNORMAL LOW
RDW: 19.6 — ABNORMAL HIGH
RDW: 19.9 — ABNORMAL HIGH
WBC: 9

## 2011-06-15 LAB — DIFFERENTIAL
Basophils Absolute: 0.1
Basophils Absolute: 0.1
Basophils Relative: 1
Basophils Relative: 1
Basophils Relative: 1
Eosinophils Absolute: 0.2
Eosinophils Absolute: 0.3
Eosinophils Relative: 2
Lymphocytes Relative: 13
Lymphs Abs: 1.3
Monocytes Absolute: 1
Monocytes Relative: 11
Neutro Abs: 6.3
Neutro Abs: 8.2 — ABNORMAL HIGH
Neutrophils Relative %: 70
Neutrophils Relative %: 70
Neutrophils Relative %: 75

## 2011-06-15 LAB — URINE MICROSCOPIC-ADD ON

## 2011-06-15 LAB — URINALYSIS, ROUTINE W REFLEX MICROSCOPIC
Ketones, ur: NEGATIVE
Protein, ur: NEGATIVE
Urobilinogen, UA: 0.2

## 2011-06-15 LAB — HEPATIC FUNCTION PANEL
ALT: 54 — ABNORMAL HIGH
AST: 77 — ABNORMAL HIGH
Albumin: 3.9
Bilirubin, Direct: 0.4 — ABNORMAL HIGH

## 2011-06-15 LAB — IRON AND TIBC
Iron: 34 — ABNORMAL LOW
Saturation Ratios: 7 — ABNORMAL LOW
TIBC: 482 — ABNORMAL HIGH
UIBC: 448

## 2011-06-15 LAB — FERRITIN: Ferritin: 29 (ref 22–322)

## 2011-06-15 LAB — HEPATITIS C ANTIBODY: HCV Ab: NEGATIVE

## 2011-06-15 LAB — BETA HCG QUANT (REF LAB): Beta hCG, Tumor Marker: <0.5 m[IU]/mL (ref <5.0)

## 2011-06-15 LAB — HEPATITIS B SURFACE ANTIGEN: Hepatitis B Surface Ag: NEGATIVE

## 2011-06-15 MED ORDER — OMEPRAZOLE 40 MG PO CPDR: 40.0000 mg | DELAYED_RELEASE_CAPSULE | Freq: Every day | ORAL | Status: DC

## 2011-06-17 LAB — BASIC METABOLIC PANEL
CO2: 25
Calcium: 9.2
GFR calc Af Amer: 52 — ABNORMAL LOW
GFR calc non Af Amer: 43 — ABNORMAL LOW
Glucose, Bld: 183 — ABNORMAL HIGH
Potassium: 4.5
Sodium: 135

## 2011-06-17 LAB — DIFFERENTIAL
Eosinophils Relative: 5
Lymphocytes Relative: 15
Monocytes Absolute: 1.1 — ABNORMAL HIGH
Monocytes Relative: 11
Neutro Abs: 7.3

## 2011-06-17 LAB — CBC
HCT: 35 — ABNORMAL LOW
Hemoglobin: 11.7 — ABNORMAL LOW
RBC: 4.53

## 2011-06-17 LAB — POCT CARDIAC MARKERS
CKMB, poc: 10
Troponin i, poc: <0.05

## 2011-06-21 LAB — CBC
Hemoglobin: 11.2 — ABNORMAL LOW
Hemoglobin: 11.3 — ABNORMAL LOW
MCV: 79.1
RBC: 4.28
RBC: 4.45
RDW: 20.3 — ABNORMAL HIGH
WBC: 8.3
WBC: 8.4
WBC: 9.3

## 2011-06-21 LAB — GLUCOSE, CAPILLARY
Glucose-Capillary: 120 — ABNORMAL HIGH
Glucose-Capillary: 123 — ABNORMAL HIGH
Glucose-Capillary: 188 — ABNORMAL HIGH
Glucose-Capillary: 196 — ABNORMAL HIGH
Glucose-Capillary: 232 — ABNORMAL HIGH
Glucose-Capillary: 239 — ABNORMAL HIGH

## 2011-06-21 LAB — COMPREHENSIVE METABOLIC PANEL
ALT: 53
AST: 53 — ABNORMAL HIGH
Alkaline Phosphatase: 320 — ABNORMAL HIGH
CO2: 28
Calcium: 9.4
Chloride: 105
GFR calc Af Amer: >60
GFR calc non Af Amer: >60
Potassium: 4.6
Sodium: 140

## 2011-06-21 LAB — TSH: TSH: 5.836 — ABNORMAL HIGH

## 2011-06-21 LAB — LIPID PANEL
HDL: 33 — ABNORMAL LOW
LDL Cholesterol: 65
LDL Cholesterol: 69
Total CHOL/HDL Ratio: 3.6
Triglycerides: 133
Triglycerides: 83
VLDL: 17
VLDL: 27

## 2011-06-21 LAB — CARDIAC PANEL(CRET KIN+CKTOT+MB+TROPI)
CK, MB: 18.1 — ABNORMAL HIGH
Relative Index: 4 — ABNORMAL HIGH

## 2011-06-21 LAB — BASIC METABOLIC PANEL
Calcium: 9.3
GFR calc Af Amer: >60
GFR calc non Af Amer: >60
Glucose, Bld: 115 — ABNORMAL HIGH
Sodium: 138

## 2011-06-21 LAB — DIFFERENTIAL
Basophils Relative: 1
Eosinophils Absolute: 0.1
Eosinophils Relative: 1
Lymphs Abs: 1.3

## 2011-06-21 LAB — CK TOTAL AND CKMB (NOT AT ARMC)
Total CK: 413 — ABNORMAL HIGH
Total CK: 506 — ABNORMAL HIGH

## 2011-06-21 LAB — POCT CARDIAC MARKERS
CKMB, poc: 13.4
CKMB, poc: 15.9
Myoglobin, poc: 262
Myoglobin, poc: 271

## 2011-06-21 LAB — TROPONIN I: Troponin I: 0.03

## 2011-06-21 LAB — APTT: aPTT: 40 — ABNORMAL HIGH

## 2011-06-21 LAB — B-NATRIURETIC PEPTIDE (CONVERTED LAB): Pro B Natriuretic peptide (BNP): 88.1

## 2011-07-01 LAB — DIFFERENTIAL
Lymphocytes Relative: 17
Monocytes Absolute: 1 — ABNORMAL HIGH
Monocytes Relative: 10
Neutro Abs: 7.3

## 2011-07-01 LAB — POCT CARDIAC MARKERS
CKMB, poc: 16
CKMB, poc: 17.5
Myoglobin, poc: 406
Troponin i, poc: <0.05

## 2011-07-01 LAB — BASIC METABOLIC PANEL
Calcium: 9.2
GFR calc Af Amer: >60
GFR calc non Af Amer: >60
Sodium: 139

## 2011-07-01 LAB — CBC
Hemoglobin: 12 — ABNORMAL LOW
RBC: 4.22

## 2011-10-15 ENCOUNTER — Other Ambulatory Visit: Payer: Self-pay

## 2011-10-15 MED ORDER — OMEPRAZOLE 40 MG PO CPDR: 40.0000 mg | DELAYED_RELEASE_CAPSULE | Freq: Every day | ORAL | Status: DC

## 2011-12-03 ENCOUNTER — Ambulatory Visit (INDEPENDENT_AMBULATORY_CARE_PROVIDER_SITE_OTHER): Payer: PRIVATE HEALTH INSURANCE | Admitting: *Deleted

## 2011-12-03 ENCOUNTER — Encounter: Payer: Self-pay | Admitting: Internal Medicine

## 2011-12-03 DIAGNOSIS — I498 Other specified cardiac arrhythmias: Secondary | ICD-10-CM

## 2011-12-03 LAB — PACEMAKER DEVICE OBSERVATION
AL THRESHOLD: 0.75 V
BATTERY VOLTAGE: 2.74 V
VENTRICULAR PACING PM: 20

## 2011-12-03 NOTE — Progress Notes (Signed)
PPM check 

## 2012-01-07 ENCOUNTER — Ambulatory Visit (INDEPENDENT_AMBULATORY_CARE_PROVIDER_SITE_OTHER): Payer: PRIVATE HEALTH INSURANCE | Admitting: Adult Health

## 2012-01-07 ENCOUNTER — Encounter: Payer: Self-pay | Admitting: Adult Health

## 2012-01-07 ENCOUNTER — Ambulatory Visit: Payer: PRIVATE HEALTH INSURANCE | Admitting: Cardiology

## 2012-01-07 VITALS — BP 110/72 | HR 81 | Ht 63.0 in | Wt 214.0 lb

## 2012-01-07 DIAGNOSIS — I350 Nonrheumatic aortic (valve) stenosis: Secondary | ICD-10-CM

## 2012-01-07 DIAGNOSIS — E785 Hyperlipidemia, unspecified: Secondary | ICD-10-CM

## 2012-01-07 DIAGNOSIS — I359 Nonrheumatic aortic valve disorder, unspecified: Secondary | ICD-10-CM

## 2012-01-07 NOTE — Assessment & Plan Note (Signed)
Continued assessment with echo next time he is seen by Dr. Ladona Ridgel. BP is stable on current medications

## 2012-01-07 NOTE — Progress Notes (Signed)
HPI: [name] is a 51 y/o patient of Dr. Lewayne Bunting with symptomatic bradycardia, PMK in situ, hypertension chronic peripheral edema, and DOE with history of diastolic heart failure. He has OSA and wears CPAP at night with O2 during the day which he does not wear. He also has diabetes, and admits to dietary noncompliance. He comes today without new complaint. He denies chest pain, syncope or significant DOE. He has had no hospitalizations or ER visits. He is not due for pacemaker interrogation until Sept of 2013.  Allergies  Allergen Reactions  . Enalapril Other (See Comments)    Cough  . Penicillins     Current Outpatient Prescriptions  Medication Sig Dispense Refill  . albuterol (VENTOLIN HFA) 108 (90 BASE) MCG/ACT inhaler Inhale 2 puffs into the lungs every 6 (six) hours as needed.        Marland Kitchen amLODipine (NORVASC) 2.5 MG tablet Take 1 tablet (2.5 mg total) by mouth daily.  30 tablet  6  . aspirin 81 MG tablet Take 81 mg by mouth daily.        Marland Kitchen atenolol (TENORMIN) 50 MG tablet Take 50 mg by mouth daily.        . Ca Carbonate-Mag Hydroxide (MYLANTA SUPREME) 400-135 MG/5ML SUSP Take by mouth as needed.        Marland Kitchen glipiZIDE (GLUCOTROL) 5 MG tablet Take 10 mg by mouth daily.       . Glucosamine-Chondroitin 1500-1200 MG/30ML LIQD Take by mouth daily.        Marland Kitchen levothyroxine (LEVOXYL) 50 MCG tablet Take 50 mcg by mouth daily.        Marland Kitchen loratadine (CLARITIN) 10 MG tablet Take 10 mg by mouth daily.        . mometasone (NASONEX) 50 MCG/ACT nasal spray Place 2 sprays into the nose daily.        . Mometasone Furo-Formoterol Fum (DULERA) 200-5 MCG/ACT AERO Inhale 2 puffs into the lungs 2 (two) times daily.        . Multiple Vitamins-Minerals (CENTRUM) tablet Take 1 tablet by mouth daily.        . nitroGLYCERIN (NITROSTAT) 0.4 MG SL tablet Place 0.4 mg under the tongue every 5 (five) minutes as needed.        Marland Kitchen omeprazole (PRILOSEC) 40 MG capsule Take 1 capsule (40 mg total) by mouth daily.  30  capsule  8  . pioglitazone (ACTOS) 45 MG tablet Take 45 mg by mouth daily.          Past Medical History  Diagnosis Date  . Chest pain 2007, 2009    cardiac cath > normal coronary arterie; nl left ventricular function  . Cardiac conduction disorder     status post pacemaker implantation in 1995 generator replaced 2005  . Aortic stenosis     mild  . Mitral regurgitation     insignificant  . HTN (hypertension)   . Hyperlipidemia     statin discontinued due to abn LFTs  . Syncope   . Obesity   . Pelvic mass     identified in 2009, stable 2010  . 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 Diabetes mellitus     +Insulin  . Benign hypertrophy of prostate     with urinary retention; repaired hypospadia; transurethral resection of the prostate   . GERD (gastroesophageal reflux disease)     status post multiple dilatations for stricture  .  Congenital deafness   . Sleep apnea     BiPAP and continuous oxygen  . Obesity 4/08    BMI= 40  . Anemia   . Alcohol use     Past Surgical History  Procedure Date  . Orchiectomy 1990    bilateral; ?neoplasm  . Repair hypospadias w/ urethroplasty   . Transurethral resection of prostate     for BPH  . Tonsillectomy and adenoidectomy   . A-v cardiac pacemaker insertion 1995  . Colonoscopy 2008    ROS Review of systems complete and found to be negative unless listed above  PHYSICAL EXAM BP 110/72  Pulse 81  Ht 5\' 3"  (1.6 m)  Wt 214 lb (97.07 kg)  BMI 37.91 kg/m2  SpO2 98%  General: Well developed, well nourished, in no acute distress Head: Eyes PERRLA, No xanthomas.   Normal cephalic and atramatic  Lungs: Clear bilaterally to auscultation and percussion. Heart: HRRR S1 S2,2/6 systolic murmur. .  Pulses are 2+ & equal.            Some radiation to the carotids No JVD.  No abdominal bruits. No femoral bruits. Abdomen: Bowel sounds are positive, abdomen soft and non-tender  without masses or                  Hernia's noted.Obese Msk:  Back normal, normal gait. Normal strength and tone for age. Extremities: No clubbing, cyanosis or edema.  DP +1 Non pitting woody edema noted bilaterally. Neuro: Alert and oriented X 3. Psych:  Good affect, responds appropriately    ASSESSMENT AND PLAN

## 2012-01-07 NOTE — Assessment & Plan Note (Signed)
He is doing well from cardiac standpoint. He is without complaint of chest pain or significant DOE.  I have advised him on low sodium diet. He is mentally slow but I think he understands that he should be more compliant with his diet.

## 2012-01-07 NOTE — Patient Instructions (Signed)
**Note De-identified [name] Obfuscation** Your physician recommends that you continue on your current medications as directed. Please refer to the Current Medication list given to you today.  Your physician recommends that you schedule a follow-up appointment in: 1 year  

## 2012-01-07 NOTE — Assessment & Plan Note (Signed)
He is followed by PCP for this and is to be seen in 3 months.

## 2012-02-08 ENCOUNTER — Other Ambulatory Visit: Payer: Self-pay | Admitting: *Deleted

## 2012-02-08 MED ORDER — AMLODIPINE BESYLATE 2.5 MG PO TABS: 2.5000 mg | ORAL_TABLET | Freq: Every day | ORAL | Status: DC

## 2012-03-31 ENCOUNTER — Other Ambulatory Visit: Payer: Self-pay | Admitting: Cardiology

## 2012-03-31 MED ORDER — ATENOLOL 50 MG PO TABS: 50.0000 mg | ORAL_TABLET | Freq: Every day | ORAL | Status: DC

## 2012-06-05 ENCOUNTER — Encounter: Payer: Self-pay | Admitting: Internal Medicine

## 2012-06-05 ENCOUNTER — Ambulatory Visit (INDEPENDENT_AMBULATORY_CARE_PROVIDER_SITE_OTHER): Payer: PRIVATE HEALTH INSURANCE | Admitting: Internal Medicine

## 2012-06-05 VITALS — BP 140/96 | HR 74 | Wt 203.0 lb

## 2012-06-05 DIAGNOSIS — I498 Other specified cardiac arrhythmias: Secondary | ICD-10-CM

## 2012-06-05 DIAGNOSIS — Z950 Presence of cardiac pacemaker: Secondary | ICD-10-CM

## 2012-06-05 DIAGNOSIS — E785 Hyperlipidemia, unspecified: Secondary | ICD-10-CM

## 2012-06-05 DIAGNOSIS — I10 Essential (primary) hypertension: Secondary | ICD-10-CM

## 2012-06-05 LAB — PACEMAKER DEVICE OBSERVATION
AL AMPLITUDE: 2.8 mv
ATRIAL PACING PM: 26
BAMS-0001: 175 {beats}/min

## 2012-06-05 NOTE — Assessment & Plan Note (Signed)
His blood pressure is back up. He was low and had his amlodipine stopped. I am not sure what his  Blood pressure is doing and have asked him to start a log, checking his pressure at least once a week. His is scheduled to return to see his medical doctor in a couple of months. In the interim I have asked him to reduce his sodium intake.

## 2012-06-05 NOTE — Assessment & Plan Note (Signed)
I have asked him to reduce his fat intake and continue to walk daily.

## 2012-06-05 NOTE — Assessment & Plan Note (Signed)
His Medtronic dual chamber PPM is working normally. Will recheck in several months. 

## 2012-06-05 NOTE — Progress Notes (Signed)
HPI [name] returns today for followup. He feels well. He has a h/o HTN, symptomatic bradycardia, DM and congenital deafness. In the interim he was found to have low blood pressure and his amlodipine was stopped. He denies sodium indiscretion. No syncope or chest pain. Allergies  Allergen Reactions  . Enalapril Other (See Comments)    Cough  . Penicillins      Current Outpatient Prescriptions  Medication Sig Dispense Refill  . aspirin 81 MG tablet Take 81 mg by mouth daily.        . Ca Carbonate-Mag Hydroxide (MYLANTA SUPREME) 400-135 MG/5ML SUSP Take by mouth as needed.        Marland Kitchen glipiZIDE (GLUCOTROL) 5 MG tablet Take 10 mg by mouth daily.       . Glucosamine-Chondroitin 1500-1200 MG/30ML LIQD Take by mouth daily.        Marland Kitchen levothyroxine (LEVOXYL) 50 MCG tablet Take 50 mcg by mouth daily.        Marland Kitchen loratadine (CLARITIN) 10 MG tablet Take 10 mg by mouth daily.        . mometasone (NASONEX) 50 MCG/ACT nasal spray Place 2 sprays into the nose daily.        . Mometasone Furo-Formoterol Fum (DULERA) 200-5 MCG/ACT AERO Inhale 2 puffs into the lungs 2 (two) times daily.        . Multiple Vitamins-Minerals (CENTRUM) tablet Take 1 tablet by mouth daily.        . nitroGLYCERIN (NITROSTAT) 0.4 MG SL tablet Place 0.4 mg under the tongue every 5 (five) minutes as needed.        Marland Kitchen omeprazole (PRILOSEC) 40 MG capsule Take 1 capsule (40 mg total) by mouth daily.  30 capsule  8  . pioglitazone (ACTOS) 45 MG tablet Take 45 mg by mouth daily.        Marland Kitchen albuterol (VENTOLIN HFA) 108 (90 BASE) MCG/ACT inhaler Inhale 2 puffs into the lungs every 6 (six) hours as needed.        Marland Kitchen atenolol (TENORMIN) 50 MG tablet Take 1 tablet (50 mg total) by mouth daily.  30 tablet  5     Past Medical History  Diagnosis Date  . Chest pain 2007, 2009    cardiac cath > normal coronary arterie; nl left ventricular function  . Cardiac conduction disorder     status post pacemaker implantation in 1995 generator replaced  2005  . Aortic stenosis     mild  . Mitral regurgitation     insignificant  . HTN (hypertension)   . Hyperlipidemia     statin discontinued due to abn LFTs  . Syncope   . Obesity   . Pelvic mass     identified in 2009, stable 2010  . 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 Diabetes mellitus     +Insulin  . Benign hypertrophy of prostate     with urinary retention; repaired hypospadia; transurethral resection of the prostate   . GERD (gastroesophageal reflux disease)     status post multiple dilatations for stricture  . Congenital deafness   . Sleep apnea     BiPAP and continuous oxygen  . Obesity 4/08    BMI= 40  . Anemia   . Alcohol use   . Other specified cardiac dysrhythmias     ROS:   All systems reviewed and negative except as noted in the HPI.   Past Surgical History  Procedure Date  . Orchiectomy 1990    bilateral; ?neoplasm  . Repair hypospadias w/ urethroplasty   . Transurethral resection of prostate     for BPH  . Tonsillectomy and adenoidectomy   . A-v cardiac pacemaker insertion 1995  . Colonoscopy 2008     Family History  Problem Relation Age of Onset  . COPD Mother   . Hypertension Mother   . Pneumonia Father   . Hypertension Other   . Heart disease Other      History   Social History  . Marital Status: Married    Spouse Name: N/A    Number of Children: N/A  . Years of Education: N/A   Occupational History  . Not on file.   Social History Main Topics  . Smoking status: Never Smoker   . Smokeless tobacco: Never Used   Comment: tobacco use- no   . Alcohol Use: No  . Drug Use: No  . Sexually Active: Not on file   Other Topics Concern  . Not on file   Social History Narrative   Part time.      BP 140/96  Pulse 74  Wt 203 lb (92.08 kg)  Physical Exam:  Well appearing middle aged man, NAD HEENT: Unremarkable Neck:  No JVD, no thyromegally Lungs:  Clear  with no wheezes, rales, or rhonchi HEART:  Regular rate rhythm, no murmurs, no rubs, no clicks Abd:  soft, positive bowel sounds, no organomegally, no rebound, no guarding Ext:  2 plus pulses, no edema, no cyanosis, no clubbing Skin:  No rashes no nodules Neuro:  CN II through XII intact, motor grossly intact  DEVICE  Normal device function.  See PaceArt for details.   Assess/Plan:

## 2012-06-05 NOTE — Patient Instructions (Addendum)
Your physician recommends that you schedule a follow-up appointment in: 1 year with Dr Taylor and 6 months with paula  

## 2012-08-31 ENCOUNTER — Telehealth: Payer: Self-pay | Admitting: Cardiology

## 2012-08-31 MED ORDER — OMEPRAZOLE 40 MG PO CPDR: 40.0000 mg | DELAYED_RELEASE_CAPSULE | Freq: Every day | ORAL | Status: DC

## 2012-08-31 NOTE — Telephone Encounter (Signed)
rx sent to pharmacy by e-script  

## 2012-08-31 NOTE — Telephone Encounter (Signed)
Needs refill on Omeprazole sent to [facility]. / tg

## 2012-09-01 ENCOUNTER — Other Ambulatory Visit: Payer: Self-pay | Admitting: Cardiology

## 2012-09-01 MED ORDER — OMEPRAZOLE 40 MG PO CPDR: 40.0000 mg | DELAYED_RELEASE_CAPSULE | Freq: Every day | ORAL | Status: DC

## 2012-10-31 ENCOUNTER — Telehealth: Payer: Self-pay | Admitting: Cardiology

## 2012-10-31 MED ORDER — ATENOLOL 50 MG PO TABS: 50.0000 mg | ORAL_TABLET | Freq: Every day | ORAL | Status: DC

## 2012-10-31 NOTE — Telephone Encounter (Signed)
Needs refill on Atenolol called to [facility]. / tgs

## 2012-12-05 ENCOUNTER — Ambulatory Visit (INDEPENDENT_AMBULATORY_CARE_PROVIDER_SITE_OTHER): Payer: PRIVATE HEALTH INSURANCE | Admitting: *Deleted

## 2012-12-05 ENCOUNTER — Encounter: Payer: Self-pay | Admitting: Internal Medicine

## 2012-12-05 ENCOUNTER — Other Ambulatory Visit: Payer: Self-pay | Admitting: Internal Medicine

## 2012-12-05 DIAGNOSIS — I498 Other specified cardiac arrhythmias: Secondary | ICD-10-CM

## 2012-12-05 LAB — PACEMAKER DEVICE OBSERVATION
AL THRESHOLD: 0.75 V
BAMS-0001: 175 {beats}/min
RV LEAD THRESHOLD: 0.75 V
VENTRICULAR PACING PM: 69

## 2012-12-05 NOTE — Progress Notes (Signed)
PPM check 

## 2012-12-19 ENCOUNTER — Ambulatory Visit: Payer: PRIVATE HEALTH INSURANCE | Admitting: Cardiology

## 2013-01-05 ENCOUNTER — Ambulatory Visit (INDEPENDENT_AMBULATORY_CARE_PROVIDER_SITE_OTHER): Payer: PRIVATE HEALTH INSURANCE | Admitting: Cardiology

## 2013-01-05 ENCOUNTER — Encounter: Payer: Self-pay | Admitting: *Deleted

## 2013-01-05 ENCOUNTER — Encounter: Payer: Self-pay | Admitting: Cardiology

## 2013-01-05 VITALS — BP 120/72 | HR 74 | Ht 64.0 in | Wt 201.1 lb

## 2013-01-05 DIAGNOSIS — D649 Anemia, unspecified: Secondary | ICD-10-CM

## 2013-01-05 DIAGNOSIS — I359 Nonrheumatic aortic valve disorder, unspecified: Secondary | ICD-10-CM

## 2013-01-05 DIAGNOSIS — Z950 Presence of cardiac pacemaker: Secondary | ICD-10-CM

## 2013-01-05 DIAGNOSIS — I350 Nonrheumatic aortic (valve) stenosis: Secondary | ICD-10-CM

## 2013-01-05 DIAGNOSIS — H9190 Unspecified hearing loss, unspecified ear: Secondary | ICD-10-CM

## 2013-01-05 DIAGNOSIS — K219 Gastro-esophageal reflux disease without esophagitis: Secondary | ICD-10-CM

## 2013-01-05 DIAGNOSIS — R079 Chest pain, unspecified: Secondary | ICD-10-CM

## 2013-01-05 NOTE — Assessment & Plan Note (Signed)
Aortic stenosis was very mild in 2009. Current examination suggests some progression. Echocardiography will be repeated.

## 2013-01-05 NOTE — Progress Notes (Deleted)
Name: [name]    DOB: [birth year]  Age: 52 y.o.  MR#: [id number]       PCP:  Elease Hashimoto, MD      Insurance: Payor: [employer]  Plan: [facility]  Product Type: *No Product type*    CC:   No chief complaint on file.  Medication bottles VS Filed Vitals:   01/05/13 1318  BP: 120/72  Pulse: 74  Height: 5\' 4"  (1.626 m)  Weight: 201 lb 1.9 oz (91.227 kg)    Weights Current Weight  01/05/13 201 lb 1.9 oz (91.227 kg)  06/05/12 203 lb (92.08 kg)  01/07/12 214 lb (97.07 kg)    Blood Pressure  BP Readings from Last 3 Encounters:  01/05/13 120/72  06/05/12 140/96  01/07/12 110/72     Admit date:  (Not on file) Last encounter with RMR:  12/19/2012   Allergy Enalapril and Penicillins  Current Outpatient Prescriptions  Medication Sig Dispense Refill  . albuterol (VENTOLIN HFA) 108 (90 BASE) MCG/ACT inhaler Inhale 2 puffs into the lungs every 6 (six) hours as needed.        Marland Kitchen aspirin 81 MG tablet Take 81 mg by mouth daily.        Marland Kitchen atenolol (TENORMIN) 50 MG tablet Take 1 tablet (50 mg total) by mouth daily.  30 tablet  6  . Ca Carbonate-Mag Hydroxide (MYLANTA SUPREME) 400-135 MG/5ML SUSP Take by mouth as needed.        . cetirizine (ZYRTEC) 10 MG tablet Take 10 mg by mouth daily.      Marland Kitchen glipiZIDE (GLUCOTROL) 5 MG tablet Take 10 mg by mouth daily.       . insulin glargine (LANTUS SOLOSTAR) 100 UNIT/ML injection Inject 18 Units into the skin at bedtime.      Marland Kitchen levothyroxine (LEVOXYL) 50 MCG tablet Take 50 mcg by mouth daily.        . Misc Natural Products (OSTEO BI-FLEX TRIPLE STRENGTH PO) Take 1 tablet by mouth daily.      . mometasone (NASONEX) 50 MCG/ACT nasal spray Place 2 sprays into the nose daily.        . Multiple Vitamins-Minerals (CENTRUM) tablet Take 1 tablet by mouth daily.        . nitroGLYCERIN (NITROSTAT) 0.4 MG SL tablet Place 0.4 mg under the tongue every 5 (five) minutes as needed.        Marland Kitchen omeprazole (PRILOSEC) 40 MG capsule Take 1 capsule (40 mg total) by  mouth daily.  30 capsule  8   No current facility-administered medications for this visit.    Discontinued Meds:    Medications Discontinued During This Encounter  Medication Reason  . budesonide-formoterol (SYMBICORT) 160-4.5 MCG/ACT inhaler Error  . Glucosamine-Chondroitin 1500-1200 MG/30ML LIQD Error  . Mometasone Furo-Formoterol Fum (DULERA) 200-5 MCG/ACT AERO Error  . pioglitazone (ACTOS) 45 MG tablet Error  . loratadine (CLARITIN) 10 MG tablet Error    Patient Active Problem List  Diagnosis  . DIABETES MELLITUS, TYPE II, ON INSULIN  . MORBID OBESITY  . ANEMIA, MILD  . DEAFNESS, CONGENITAL  . CONDUCTION DISORDER OF THE HEART  . GASTROESOPHAGEAL REFLUX DISEASE  . CARDIAC PACEMAKER IN SITU  . Chest pain  . Aortic stenosis  . HTN (hypertension)  . Hyperlipidemia  . Pelvic mass  . Hepatic disease  . Benign hypertrophy of prostate  . Sleep apnea  . Asthmatic bronchitis  . Other specified cardiac dysrhythmias    LABS    Component Value Date/Time  NA 140 01/27/2010   NA 138 10/29/2008 1125   NA 136 09/23/2008 1056   K 4.9 01/27/2010   K 4.9 10/29/2008 1125   K 4.8 09/23/2008 1056   CL 103 01/27/2010   CL 104 10/29/2008 1125   CL 102 09/23/2008 1056   CO2 22 01/27/2010   CO2 24 10/29/2008 1125   CO2 27 09/23/2008 1056   GLUCOSE 110 01/27/2010   GLUCOSE 117* 10/29/2008 1125   GLUCOSE 191* 09/23/2008 1056   BUN 25 01/27/2010   BUN 22 10/29/2008 1125   BUN 17 09/23/2008 1056   CREATININE 1.13 01/27/2010   CREATININE 1.01 10/29/2008 1125   CREATININE 1.02 09/23/2008 1056   CALCIUM 9.4 10/29/2008 1125   CALCIUM 9.4 09/23/2008 1056   CALCIUM 9.3 06/15/2008 0350   GFRNONAA >60 10/29/2008 1125   GFRNONAA >60 09/23/2008 1056   GFRNONAA >60 06/15/2008 0350   GFRAA  Value: >60        The eGFR has been calculated using the MDRD equation. This calculation has not been validated in all clinical situations. eGFR's persistently <60 mL/min signify possible Chronic Kidney Disease. 10/29/2008 1125   GFRAA  Value:  >60        The eGFR has been calculated using the MDRD equation. This calculation has not been validated in all clinical situations. eGFR's persistently <60 mL/min signify possible Chronic Kidney Disease. 09/23/2008 1056   GFRAA  Value: >60        The eGFR has been calculated using the MDRD equation. This calculation has not been validated in all clinical 06/15/2008 0350   CMP     Component Value Date/Time   NA 140 01/27/2010   K 4.9 01/27/2010   CL 103 01/27/2010   CO2 22 01/27/2010   GLUCOSE 110 01/27/2010   BUN 25 01/27/2010   CREATININE 1.13 01/27/2010   CALCIUM 9.4 10/29/2008 1125   PROT 6.0 09/23/2008 1056   ALBUMIN 3.9 09/23/2008 1056   AST 57* 09/23/2008 1056   ALT 46 09/23/2008 1056   ALKPHOS 198* 09/23/2008 1056   BILITOT 1.8* 09/23/2008 1056   GFRNONAA >60 10/29/2008 1125   GFRAA  Value: >60        The eGFR has been calculated using the MDRD equation. This calculation has not been validated in all clinical situations. eGFR's persistently <60 mL/min signify possible Chronic Kidney Disease. 10/29/2008 1125       Component Value Date/Time   WBC 10.2 10/29/2008 1125   WBC 8.6 09/23/2008 1056   WBC 10.7* 08/14/2008 2221   HGB 12.3* 10/29/2008 1125   HGB 11.3* 09/23/2008 1056   HGB 10.6* 08/14/2008 2221   HCT 37.7* 10/29/2008 1125   HCT 35.6* 09/23/2008 1056   HCT 37.5* 08/14/2008 2221   MCV 78.9 10/29/2008 1125   MCV 78.9 09/23/2008 1056   MCV 82.8 08/14/2008 2221    Lipid Panel     Component Value Date/Time   CHOL 141 05/19/2010 2137   TRIG 179* 05/19/2010 2137   HDL 33* 05/19/2010 2137   CHOLHDL 4.3 Ratio 05/19/2010 2137   VLDL 36 05/19/2010 2137   LDLCALC 72 05/19/2010 2137    ABG No results found for this basename: phart, pco2, pco2art, po2, po2art, hco3, tco2, acidbasedef, o2sat     Lab Results  Component Value Date   TSH 1.77 01/27/2010   BNP (last 3 results) No results found for this basename: PROBNP,  in the last 8760 hours Cardiac Panel (last 3 results) No results found  for this basename:  CKTOTAL, CKMB, TROPONINI, RELINDX,  in the last 72 hours  Iron/TIBC/Ferritin    Component Value Date/Time   IRON 34* 12/21/2007 0600   TIBC 482* 12/21/2007 0600   FERRITIN 29 12/21/2007 0600     EKG Orders placed in visit on 01/05/13  . EKG 12-LEAD     Prior Assessment and Plan Problem List as of 01/05/2013     ICD-9-CM     Cardiology Problems   CONDUCTION DISORDER OF THE HEART   Last Assessment & Plan   01/07/2012 Office Visit Written 01/07/2012  1:54 PM by Jodelle Gross, NP     He is doing well from cardiac standpoint. He is without complaint of chest pain or significant DOE.  I have advised him on low sodium diet. He is mentally slow but I think he understands that he should be more compliant with his diet.    Aortic stenosis   Last Assessment & Plan   01/07/2012 Office Visit Written 01/07/2012  1:56 PM by Jodelle Gross, NP     Continued assessment with echo next time he is seen by Dr. Ladona Ridgel. BP is stable on current medications    HTN (hypertension)   Last Assessment & Plan   06/05/2012 Office Visit Written 06/05/2012  8:53 AM by Marinus Maw, MD     His blood pressure is back up. He was low and had his amlodipine stopped. I am not sure what his  Blood pressure is doing and have asked him to start a log, checking his pressure at least once a week. His is scheduled to return to see his medical doctor in a couple of months. In the interim I have asked him to reduce his sodium intake.     Hyperlipidemia   Last Assessment & Plan   06/05/2012 Office Visit Written 06/05/2012  8:54 AM by Marinus Maw, MD     I have asked him to reduce his fat intake and continue to walk daily.    Other specified cardiac dysrhythmias     Other   DIABETES MELLITUS, TYPE II, ON INSULIN   Last Assessment & Plan   04/13/2011 Office Visit Edited 04/19/2011  8:23 AM by Kathlen Brunswick, MD     Diabetic control was excellent when last assessed approximately one year ago.  Risk of coronary disease is  significant in the setting of diabetes and multiple additional contributing factors.  Negative coronary angiography 3 years ago it is somewhat reassuring, but does not protect against future development of disease.  Despite mild LFT abnormalities, if significant hyperlipidemia is present, I would be inclined to proceed with statin therapy.  A repeat lipid profile is pending.    MORBID OBESITY   ANEMIA, MILD   DEAFNESS, CONGENITAL   GASTROESOPHAGEAL REFLUX DISEASE   CARDIAC PACEMAKER IN SITU   Last Assessment & Plan   06/05/2012 Office Visit Written 06/05/2012  8:49 AM by Marinus Maw, MD     His Medtronic dual chamber PPM is working normally. Will recheck in several months.    Chest pain   Pelvic mass   Hepatic disease   Last Assessment & Plan   04/13/2011 Office Visit Written 04/13/2011  8:36 PM by Kathlen Brunswick, MD     A repeat hepatic profile is pending.    Benign hypertrophy of prostate   Sleep apnea   Asthmatic bronchitis       Imaging: No results found.

## 2013-01-05 NOTE — Progress Notes (Signed)
Patient ID: [name], male   DOB: 06-05-1961, 52 y.o.   MRN: [id number]  HPI: Schedule return visit for this nice gentleman with aortic valve disease and conduction system disease requiring previous pacemaker implantation. In recent months, he has done quite well. He has noted some swelling in his right calf without discomfort. Lifestyle is sedentary, but he is able to do yard work without difficulty.  Blood pressure intermittently assessed outside of medical setting and consistently <130/90.  Current Outpatient Prescriptions  Medication Sig Dispense Refill  . albuterol (VENTOLIN HFA) 108 (90 BASE) MCG/ACT inhaler Inhale 2 puffs into the lungs every 6 (six) hours as needed.        Marland Kitchen aspirin 81 MG tablet Take 81 mg by mouth daily.        Marland Kitchen atenolol (TENORMIN) 50 MG tablet Take 1 tablet (50 mg total) by mouth daily.  30 tablet  6  . Ca Carbonate-Mag Hydroxide (MYLANTA SUPREME) 400-135 MG/5ML SUSP Take by mouth as needed.        . cetirizine (ZYRTEC) 10 MG tablet Take 10 mg by mouth daily.      Marland Kitchen glipiZIDE (GLUCOTROL) 5 MG tablet Take 10 mg by mouth daily.       . insulin glargine (LANTUS SOLOSTAR) 100 UNIT/ML injection Inject 18 Units into the skin at bedtime.      Marland Kitchen levothyroxine (LEVOXYL) 50 MCG tablet Take 50 mcg by mouth daily.        . Misc Natural Products (OSTEO BI-FLEX TRIPLE STRENGTH PO) Take 1 tablet by mouth daily.      . mometasone (NASONEX) 50 MCG/ACT nasal spray Place 2 sprays into the nose daily.        . Multiple Vitamins-Minerals (CENTRUM) tablet Take 1 tablet by mouth daily.        . nitroGLYCERIN (NITROSTAT) 0.4 MG SL tablet Place 0.4 mg under the tongue every 5 (five) minutes as needed.        Marland Kitchen omeprazole (PRILOSEC) 40 MG capsule Take 1 capsule (40 mg total) by mouth daily.  30 capsule  8   No current facility-administered medications for this visit.   Allergies  Allergen Reactions  . Enalapril Other (See Comments)    Cough  . Penicillins      Past medical  history, social history, and family history reviewed and updated.  ROS: Denies chest pain, dyspnea, orthopnea, PND, palpitations, lightheadedness or syncope. All other systems reviewed and are negative.  PHYSICAL EXAM: BP 120/72  Pulse 74  Ht 5\' 4"  (1.626 m)  Wt 91.227 kg (201 lb 1.9 oz)  BMI 34.51 kg/m2;  Body mass index is 34.51 kg/(m^2). General-Well developed; no acute distress Body habitus-mildly to moderately overweight Neck-No JVD Lungs-clear lung fields; resonant to percussion Cardiovascular-normal PMI; normal S1, decreased A2, grade 3/6 mid-peaking systolic ejection murmur radiating to the carotids Abdomen-normal bowel sounds; soft and non-tender without masses or organomegaly Musculoskeletal-No deformities, no cyanosis or clubbing Neurologic-Normal cranial nerves; symmetric strength and tone Skin-Warm, no significant lesions Extremities-distal pulses intact; no edema; increased pigmentation over the shins; prominent calf muscles without edema or other clearly pathologic findings.  Mamers Bing, MD 01/05/2013  5:40 PM  ASSESSMENT AND PLAN

## 2013-01-05 NOTE — Patient Instructions (Addendum)
Your physician has requested that you have an echocardiogram. Echocardiography is a painless test that uses sound waves to create images of your heart. It provides your doctor with information about the size and shape of your heart and how well your heart's chambers and valves are working. This procedure takes approximately one hour. There are no restrictions for this procedure.  Your physician recommends that you schedule a follow-up appointment in: 9 months

## 2013-01-05 NOTE — Assessment & Plan Note (Signed)
No recent laboratory study results; anemia and other basic medical problems are managed by patient's PCP.

## 2013-01-19 ENCOUNTER — Telehealth: Payer: Self-pay | Admitting: *Deleted

## 2013-01-19 NOTE — Telephone Encounter (Signed)
.  left message to have patient return my call to clarify the name of his PCP to retrieve his latest labs, noted Illene Regulus but wanted to clarify this is correct

## 2013-01-22 NOTE — Telephone Encounter (Signed)
Noted [name] is where MD Illene Regulus is located, pt confirmed Echo this upcoming Friday at 1pm, will retrieve records from [facility]

## 2013-01-24 ENCOUNTER — Inpatient Hospital Stay (HOSPITAL_COMMUNITY): Admission: RE | Admit: 2013-01-24 | Payer: PRIVATE HEALTH INSURANCE | Source: Ambulatory Visit

## 2013-01-25 NOTE — Telephone Encounter (Signed)
Labs received and placed on RR desk for review

## 2013-01-30 ENCOUNTER — Ambulatory Visit (HOSPITAL_COMMUNITY)
Admission: RE | Admit: 2013-01-30 | Discharge: 2013-01-30 | Disposition: A | Discharge status: Home / Self Care | Payer: PRIVATE HEALTH INSURANCE | Source: Ambulatory Visit | Attending: Cardiology | Admitting: Cardiology

## 2013-01-30 DIAGNOSIS — R079 Chest pain, unspecified: Secondary | ICD-10-CM | POA: Insufficient documentation

## 2013-01-30 DIAGNOSIS — I359 Nonrheumatic aortic valve disorder, unspecified: Secondary | ICD-10-CM | POA: Insufficient documentation

## 2013-01-30 DIAGNOSIS — I10 Essential (primary) hypertension: Secondary | ICD-10-CM | POA: Insufficient documentation

## 2013-01-30 NOTE — Progress Notes (Signed)
*  PRELIMINARY RESULTS* Echocardiogram 2D Echocardiogram has been performed.  [name] 01/30/2013, 2:11 PM

## 2013-02-01 ENCOUNTER — Encounter: Payer: Self-pay | Admitting: Cardiology

## 2013-02-07 ENCOUNTER — Encounter: Payer: Self-pay | Admitting: Cardiology

## 2013-02-21 ENCOUNTER — Encounter: Payer: Self-pay | Admitting: Cardiology

## 2013-02-23 ENCOUNTER — Other Ambulatory Visit (HOSPITAL_COMMUNITY): Payer: PRIVATE HEALTH INSURANCE

## 2013-02-26 ENCOUNTER — Ambulatory Visit (HOSPITAL_COMMUNITY)
Admission: RE | Admit: 2013-02-26 | Discharge: 2013-02-26 | Disposition: A | Discharge status: Home / Self Care | Payer: PRIVATE HEALTH INSURANCE | Source: Ambulatory Visit | Attending: Cardiology | Admitting: Cardiology

## 2013-02-26 NOTE — Progress Notes (Signed)
*  PRELIMINARY RESULTS* Echocardiogram Limited study additional views no charge/ Dr. Dietrich Pates has been performed.  [name] 02/26/2013, 2:06 PM

## 2013-05-03 ENCOUNTER — Other Ambulatory Visit: Payer: Self-pay

## 2013-05-03 MED ORDER — ATENOLOL 50 MG PO TABS: 50.0000 mg | ORAL_TABLET | Freq: Every day | ORAL | Status: DC

## 2013-06-01 ENCOUNTER — Encounter: Payer: Self-pay | Admitting: Internal Medicine

## 2013-06-01 ENCOUNTER — Ambulatory Visit (INDEPENDENT_AMBULATORY_CARE_PROVIDER_SITE_OTHER): Payer: PRIVATE HEALTH INSURANCE | Admitting: Internal Medicine

## 2013-06-01 VITALS — BP 122/77 | HR 91 | Ht 63.0 in | Wt 196.0 lb

## 2013-06-01 DIAGNOSIS — I359 Nonrheumatic aortic valve disorder, unspecified: Secondary | ICD-10-CM

## 2013-06-01 DIAGNOSIS — I498 Other specified cardiac arrhythmias: Secondary | ICD-10-CM

## 2013-06-01 DIAGNOSIS — I350 Nonrheumatic aortic (valve) stenosis: Secondary | ICD-10-CM

## 2013-06-01 DIAGNOSIS — Z950 Presence of cardiac pacemaker: Secondary | ICD-10-CM

## 2013-06-01 LAB — PACEMAKER DEVICE OBSERVATION
AL AMPLITUDE: 4 mv
BAMS-0001: 175 {beats}/min
RV LEAD THRESHOLD: 0.5 V

## 2013-06-01 MED ORDER — NITROGLYCERIN 0.4 MG SL SUBL: 0.4000 mg | SUBLINGUAL_TABLET | SUBLINGUAL | Status: DC | PRN

## 2013-06-01 NOTE — Progress Notes (Signed)
HPI Mr. Mccardle returns today for followup. He is a very pleasant 52 year old man with symptomatic bradycardia, status post pacemaker insertion, COPD, hypertension, and obesity. He has a history of venous insufficiency. He also has a history of mild aortic stenosis. Allergies  Allergen Reactions  . Enalapril Other (See Comments)    Cough  . Penicillins     Current Outpatient Prescriptions  Medication Sig Dispense Refill  . albuterol (VENTOLIN HFA) 108 (90 BASE) MCG/ACT inhaler Inhale 2 puffs into the lungs every 6 (six) hours as needed.        Marland Kitchen aspirin 81 MG tablet Take 81 mg by mouth daily.        Marland Kitchen atenolol (TENORMIN) 50 MG tablet Take 1 tablet (50 mg total) by mouth daily.  30 tablet  6  . Ca Carbonate-Mag Hydroxide (MYLANTA SUPREME) 400-135 MG/5ML SUSP Take by mouth as needed.        . cetirizine (ZYRTEC) 10 MG tablet Take 10 mg by mouth daily.      Marland Kitchen levothyroxine (LEVOXYL) 50 MCG tablet Take 50 mcg by mouth daily.        . Misc Natural Products (OSTEO BI-FLEX TRIPLE STRENGTH PO) Take 1 tablet by mouth daily.      . mometasone (NASONEX) 50 MCG/ACT nasal spray Place 2 sprays into the nose daily.        . Multiple Vitamins-Minerals (CENTRUM) tablet Take 1 tablet by mouth daily.        . nitroGLYCERIN (NITROSTAT) 0.4 MG SL tablet Place 1 tablet (0.4 mg total) under the tongue every 5 (five) minutes as needed.  25 tablet  3  . NOVOLOG MIX 70/30 FLEXPEN (70-30) 100 UNIT/ML SUPN       . omeprazole (PRILOSEC) 40 MG capsule Take 1 capsule (40 mg total) by mouth daily.  30 capsule  8   No current facility-administered medications for this visit.     Past Medical History  Diagnosis Date  . Chest pain 2007, 2009    cardiac cath > normal coronary arterie; nl left ventricular function  . Cardiac conduction disorder     status post pacemaker implantation in 1995 generator replaced 2005  . Aortic stenosis     mild  . Mitral regurgitation     insignificant  . HTN (hypertension)   .  Hyperlipidemia     statin discontinued due to abn LFTs  . Syncope   . Obesity   . Pelvic mass     identified in 2009, stable 2010  . 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 Diabetes mellitus     +Insulin  . Benign hypertrophy of prostate     with urinary retention; repaired hypospadia; transurethral resection of the prostate   . GERD (gastroesophageal reflux disease)     status post multiple dilatations for stricture  . Congenital deafness   . Sleep apnea     BiPAP and continuous oxygen  . Obesity 4/08    BMI= 40  . Anemia   . Alcohol use   . Other specified cardiac dysrhythmias(427.89)     ROS:   All systems reviewed and negative except as noted in the HPI.   Past Surgical History  Procedure Laterality Date  . Orchiectomy  1990    bilateral; ?neoplasm  . Repair hypospadias w/ urethroplasty    . Transurethral resection of prostate      for BPH  . Tonsillectomy and adenoidectomy    .  A-v cardiac pacemaker insertion  1995  . Colonoscopy  2008     Family History  Problem Relation Age of Onset  . COPD Mother   . Hypertension Mother   . Pneumonia Father   . Hypertension Other   . Heart disease Other      History   Social History  . Marital Status: Married    Spouse Name: N/A    Number of Children: N/A  . Years of Education: N/A   Occupational History  . Not on file.   Social History Main Topics  . Smoking status: Never Smoker   . Smokeless tobacco: Never Used     Comment: tobacco use- no   . Alcohol Use: No  . Drug Use: No  . Sexual Activity: Not on file   Other Topics Concern  . Not on file   Social History Narrative   Part time.      BP 122/77  Pulse 91  Ht 5\' 3"  (1.6 m)  Wt 196 lb (88.905 kg)  BMI 34.73 kg/m2  Physical Exam:  Well appearing middle-aged man,NAD HEENT: Unremarkable Neck:  6 cm JVD, no thyromegally Back:  No CVA tenderness Lungs:  Clear with no wheezes,  rales, or rhonchi. HEART:  Regular rate rhythm, no murmurs, no rubs, no clicks Abd:  soft, positive bowel sounds, no organomegally, no rebound, no guarding Ext:  2 plus pulses, no edema, no cyanosis, no clubbing Skin:  No rashes no nodules Neuro:  CN II through XII intact, motor grossly intact   DEVICE  Normal device function.  See PaceArt for details. Device is approaching elective replacement  Assess/Plan:

## 2013-06-01 NOTE — Assessment & Plan Note (Signed)
His Medtronic dual-chamber pacemaker is working normally but is close to elective replacement with approximately 3-4 months of battery longevity. We'll plan to recheck in a few months. The patient has had a long-standing pacemaker, and his leads are programmed unipolar, with concern about long-term reliability of the leads. He may require a new pacing system including leads at his change out.

## 2013-06-01 NOTE — Patient Instructions (Addendum)
Your physician recommends that you schedule a follow-up appointment in: 1 Year with Dr Ladona Ridgel and 2 Months with Gunnar Fusi

## 2013-06-01 NOTE — Assessment & Plan Note (Signed)
His aortic stenosis has been mild in the past. On exam today it sounds more moderate to severe. We'll keep an eye on this, and consider repeat echo.

## 2013-06-25 ENCOUNTER — Encounter: Payer: Self-pay | Admitting: Internal Medicine

## 2013-07-11 ENCOUNTER — Telehealth: Payer: Self-pay | Admitting: *Deleted

## 2013-07-11 MED ORDER — OMEPRAZOLE 40 MG PO CPDR: 40.0000 mg | DELAYED_RELEASE_CAPSULE | Freq: Every day | ORAL | Status: DC

## 2013-07-11 NOTE — Telephone Encounter (Signed)
Medication sent via escribe.  

## 2013-07-11 NOTE — Telephone Encounter (Signed)
FAXED RX REFILL FOR OMEPRAZOLE DR 40 MG #30 FROM [facility]

## 2013-07-30 ENCOUNTER — Ambulatory Visit (INDEPENDENT_AMBULATORY_CARE_PROVIDER_SITE_OTHER): Payer: PRIVATE HEALTH INSURANCE | Admitting: Cardiology

## 2013-07-30 ENCOUNTER — Encounter: Payer: Self-pay | Admitting: Cardiology

## 2013-07-30 VITALS — BP 122/83 | HR 84 | Ht 64.0 in | Wt 191.8 lb

## 2013-07-30 DIAGNOSIS — I359 Nonrheumatic aortic valve disorder, unspecified: Secondary | ICD-10-CM

## 2013-07-30 DIAGNOSIS — I10 Essential (primary) hypertension: Secondary | ICD-10-CM

## 2013-07-30 DIAGNOSIS — IMO0002 Reserved for concepts with insufficient information to code with codable children: Secondary | ICD-10-CM

## 2013-07-30 DIAGNOSIS — R079 Chest pain, unspecified: Secondary | ICD-10-CM

## 2013-07-30 DIAGNOSIS — I350 Nonrheumatic aortic (valve) stenosis: Secondary | ICD-10-CM

## 2013-07-30 MED ORDER — METOPROLOL TARTRATE 25 MG PO TABS: 25.0000 mg | ORAL_TABLET | Freq: Two times a day (BID) | ORAL | Status: DC

## 2013-07-30 MED ORDER — NITROGLYCERIN 0.4 MG SL SUBL: 0.4000 mg | SUBLINGUAL_TABLET | SUBLINGUAL | Status: DC | PRN

## 2013-07-30 MED ORDER — OMEPRAZOLE 40 MG PO CPDR: 40.0000 mg | DELAYED_RELEASE_CAPSULE | Freq: Every day | ORAL | Status: DC

## 2013-07-30 NOTE — Progress Notes (Signed)
Clinical Summary Mr. Hinderman is a 52 y.o.male seen today for follow up for the following medical problems  1. Chest pain - presented to [facility] end of October, discharged after approx 3 days per family report. Do not have discharge summary at this time - reported a history of chest pain for a few weeks, presented to hospital after it significantly worsened. Sharp pain mid chest, 3/10. + SOB, no N/V, no diaphoresis. On day he presented to hospital, pain had increased. - heart cath at [facility], cath report shows normal coroniares - prior caths in 2007 and 2009 also showed patent coronaries - no recent chest pain since discharge   2. HTN - does not check at home - compliant with meds - history of diabetes, not on ACE-I, listed as having an allergy.  3. Hyperlipidemia - followed by PCP, no recent levels in our system  4. OSA  - compliant with BIPAP  5. Symptomatic bradycardia - prior pacemaker placement, followed by EP Dr. Vic Blackbird - 05/2013 EP visit showed normal functioning pacemaker, with expected need for replacement in 3-4 months  6. Chronic hypoxia - on O2 for over 10 years - followed by pulmonary, family reports this is due to COPD  Past Medical History  Diagnosis Date  . Chest pain 2007, 2009    cardiac cath > normal coronary arterie; nl left ventricular function  . Cardiac conduction disorder     status post pacemaker implantation in 1995 generator replaced 2005  . Aortic stenosis     mild  . Mitral regurgitation     insignificant  . HTN (hypertension)   . Hyperlipidemia     statin discontinued due to abn LFTs  . Syncope   . Obesity   . Pelvic mass     identified in 2009, stable 2010  . 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 Diabetes mellitus     +Insulin  . Benign hypertrophy of prostate     with urinary retention; repaired hypospadia; transurethral resection of the prostate   .  GERD (gastroesophageal reflux disease)     status post multiple dilatations for stricture  . Congenital deafness   . Sleep apnea     BiPAP and continuous oxygen  . Obesity 4/08    BMI= 40  . Anemia   . Alcohol use   . Other specified cardiac dysrhythmias(427.89)      Allergies  Allergen Reactions  . Enalapril Other (See Comments)    Cough  . Penicillins      Current Outpatient Prescriptions  Medication Sig Dispense Refill  . albuterol (VENTOLIN HFA) 108 (90 BASE) MCG/ACT inhaler Inhale 2 puffs into the lungs every 6 (six) hours as needed.        Marland Kitchen aspirin 81 MG tablet Take 81 mg by mouth daily.        Marland Kitchen atenolol (TENORMIN) 50 MG tablet Take 1 tablet (50 mg total) by mouth daily.  30 tablet  6  . Ca Carbonate-Mag Hydroxide (MYLANTA SUPREME) 400-135 MG/5ML SUSP Take by mouth as needed.        . cetirizine (ZYRTEC) 10 MG tablet Take 10 mg by mouth daily.      Marland Kitchen levothyroxine (LEVOXYL) 50 MCG tablet Take 50 mcg by mouth daily.        . Misc Natural Products (OSTEO BI-FLEX TRIPLE STRENGTH PO) Take 1 tablet by mouth daily.      Marland Kitchen  mometasone (NASONEX) 50 MCG/ACT nasal spray Place 2 sprays into the nose daily.        . Multiple Vitamins-Minerals (CENTRUM) tablet Take 1 tablet by mouth daily.        . nitroGLYCERIN (NITROSTAT) 0.4 MG SL tablet Place 1 tablet (0.4 mg total) under the tongue every 5 (five) minutes as needed.  25 tablet  3  . NOVOLOG MIX 70/30 FLEXPEN (70-30) 100 UNIT/ML SUPN       . omeprazole (PRILOSEC) 40 MG capsule Take 1 capsule (40 mg total) by mouth daily.  30 capsule  8   No current facility-administered medications for this visit.     Past Surgical History  Procedure Laterality Date  . Orchiectomy  1990    bilateral; ?neoplasm  . Repair hypospadias w/ urethroplasty    . Transurethral resection of prostate      for BPH  . Tonsillectomy and adenoidectomy    . A-v cardiac pacemaker insertion  1995  . Colonoscopy  2008     Allergies  Allergen Reactions    . Enalapril Other (See Comments)    Cough  . Penicillins       Family History  Problem Relation Age of Onset  . COPD Mother   . Hypertension Mother   . Pneumonia Father   . Hypertension Other   . Heart disease Other      Social History Mr. Bollman reports that he has never smoked. He has never used smokeless tobacco. Mr. Brue reports that he does not drink alcohol.   Review of Systems CONSTITUTIONAL: No weight loss, fever, chills, weakness or fatigue.  HEENT: Eyes: No visual loss, blurred vision, double vision or yellow sclerae.No hearing loss, sneezing, congestion, runny nose or sore throat.  SKIN: No rash or itching.  CARDIOVASCULAR: per HPI RESPIRATORY: chronic SOB  GASTROINTESTINAL: No anorexia, nausea, vomiting or diarrhea. No abdominal pain or blood.  GENITOURINARY: No burning on urination, no polyuria NEUROLOGICAL: No headache, dizziness, syncope, paralysis, ataxia, numbness or tingling in the extremities. No change in bowel or bladder control.  MUSCULOSKELETAL: No muscle, back pain, joint pain or stiffness.  LYMPHATICS: No enlarged nodes. No history of splenectomy.  PSYCHIATRIC: No history of depression or anxiety.  ENDOCRINOLOGIC: No reports of sweating, cold or heat intolerance. No polyuria or polydipsia.  Marland Kitchen   Physical Examination p 84 bp 122/83 Wt 191 lbs BMI 33 Gen: resting comfortably, no acute distress HEENT: no scleral icterus, pupils equal round and reactive, no palptable cervical adenopathy,  CV: RRR, 3/6 early to mid peaking systolic murmur RUSB, no JVD, no carotid bruits Resp: Clear to auscultation bilaterally GI: abdomen is soft, non-tender, non-distended, normal bowel sounds, no hepatosplenomegaly MSK: extremities are warm, no edema.  Skin: warm, no rash Neuro:  no focal deficits Psych: appropriate affect   Diagnostic Studies 02/2013 Echo limited evaluate AS: mild AS (mean grad 16  01/2013 Echo: difficult study, LV function mildly  reduced ( no percentage given), possible moderate to severe  AS (AVA .53) mean grad 27 mmHg.   07/30/13 Clinic EKG: sinus rhythm, 1st degree AV block, RBBB  Assessment and Plan  1. Chest pain - recent admission to [facility], we are awaiting the discharge summary but the cath report shows patent coronaries - prior caths in 2007 and 2009 in setting of chest pain also with normal coronaries - continue current medications, will follow up discharge summary  2. HTN - at goal, continue current medications - history of diabetes, listed allergy to ACE-I.  3. Hyperlipidemia - per notes, no recent panel in our system, followed by PCP - defer lipid management to PCP  4. OSA - continue night time bipap  5. Symptomatic bradycardia - no current symptoms - prior pacemaker placement that is followed by EP, continue regular follow up.  6. COPD -management per pulmonary  7. Aortic stenosis - mild by most recent echo - continue to follow clinically, repeat echo at future visits.    Follow up 3 months   Antoine Poche, M.D., F.A.C.C.

## 2013-07-30 NOTE — Patient Instructions (Addendum)
Your physician recommends that you schedule a follow-up appointment in: 3 months with Dr Branch You will receive a reminder letter two months in advance reminding you to call and schedule your appointment. If you don't receive this letter, please contact our office.  Your physician recommends that you continue on your current medications as directed. Please refer to the Current Medication list given to you today.   

## 2013-08-14 ENCOUNTER — Emergency Department (HOSPITAL_COMMUNITY)
Admission: EM | Admit: 2013-08-14 | Discharge: 2013-08-14 | Disposition: A | Discharge status: Home / Self Care | Payer: Medicare Other | Attending: Emergency Medicine | Admitting: Emergency Medicine

## 2013-08-14 ENCOUNTER — Encounter (HOSPITAL_COMMUNITY): Payer: Self-pay | Admitting: Emergency Medicine

## 2013-08-14 DIAGNOSIS — G4730 Sleep apnea, unspecified: Secondary | ICD-10-CM | POA: Insufficient documentation

## 2013-08-14 DIAGNOSIS — Z794 Long term (current) use of insulin: Secondary | ICD-10-CM | POA: Insufficient documentation

## 2013-08-14 DIAGNOSIS — K219 Gastro-esophageal reflux disease without esophagitis: Secondary | ICD-10-CM | POA: Insufficient documentation

## 2013-08-14 DIAGNOSIS — Z8619 Personal history of other infectious and parasitic diseases: Secondary | ICD-10-CM | POA: Insufficient documentation

## 2013-08-14 DIAGNOSIS — R011 Cardiac murmur, unspecified: Secondary | ICD-10-CM | POA: Insufficient documentation

## 2013-08-14 DIAGNOSIS — E669 Obesity, unspecified: Secondary | ICD-10-CM | POA: Insufficient documentation

## 2013-08-14 DIAGNOSIS — R040 Epistaxis: Secondary | ICD-10-CM | POA: Insufficient documentation

## 2013-08-14 DIAGNOSIS — Z79899 Other long term (current) drug therapy: Secondary | ICD-10-CM | POA: Insufficient documentation

## 2013-08-14 DIAGNOSIS — Z880 Allergy status to penicillin: Secondary | ICD-10-CM | POA: Insufficient documentation

## 2013-08-14 DIAGNOSIS — R0602 Shortness of breath: Secondary | ICD-10-CM | POA: Insufficient documentation

## 2013-08-14 DIAGNOSIS — E119 Type 2 diabetes mellitus without complications: Secondary | ICD-10-CM | POA: Insufficient documentation

## 2013-08-14 DIAGNOSIS — Z87448 Personal history of other diseases of urinary system: Secondary | ICD-10-CM | POA: Insufficient documentation

## 2013-08-14 DIAGNOSIS — I10 Essential (primary) hypertension: Secondary | ICD-10-CM | POA: Insufficient documentation

## 2013-08-14 DIAGNOSIS — Z862 Personal history of diseases of the blood and blood-forming organs and certain disorders involving the immune mechanism: Secondary | ICD-10-CM | POA: Insufficient documentation

## 2013-08-14 DIAGNOSIS — R209 Unspecified disturbances of skin sensation: Secondary | ICD-10-CM | POA: Insufficient documentation

## 2013-08-14 DIAGNOSIS — Z7982 Long term (current) use of aspirin: Secondary | ICD-10-CM | POA: Insufficient documentation

## 2013-08-14 NOTE — ED Provider Notes (Signed)
CSN: [id number]     Arrival date & time 08/14/13  2021 History   First MD Initiated Contact with Patient 08/14/13 2058     Chief Complaint  Patient presents with  . Epistaxis   (Consider location/radiation/quality/duration/timing/severity/associated sxs/prior Treatment) Patient is a 52 y.o. male presenting with nosebleeds. The history is provided by the patient.  Epistaxis Location:  R nare Severity:  Moderate Duration:  6 hours Timing:  Constant Progression:  Unchanged Chronicity:  New Context: home oxygen   Relieved by:  Nothing Worsened by:  Nothing tried Ineffective treatments:  None tried Associated symptoms: no cough and no dizziness   Risk factors: no alcohol use, no change in medication and no recent nasal surgery     Past Medical History  Diagnosis Date  . Chest pain 2007, 2009    cardiac cath > normal coronary arterie; nl left ventricular function  . Cardiac conduction disorder     status post pacemaker implantation in 1995 generator replaced 2005  . Aortic stenosis     mild  . Mitral regurgitation     insignificant  . HTN (hypertension)   . Hyperlipidemia     statin discontinued due to abn LFTs  . Syncope   . Obesity   . Pelvic mass     identified in 2009, stable 2010  . 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 Diabetes mellitus     +Insulin  . Benign hypertrophy of prostate     with urinary retention; repaired hypospadia; transurethral resection of the prostate   . GERD (gastroesophageal reflux disease)     status post multiple dilatations for stricture  . Congenital deafness   . Sleep apnea     BiPAP and continuous oxygen  . Obesity 4/08    BMI= 40  . Anemia   . Alcohol use   . Other specified cardiac dysrhythmias(427.89)    Past Surgical History  Procedure Laterality Date  . Orchiectomy  1990    bilateral; ?neoplasm  . Repair hypospadias w/ urethroplasty    . Transurethral  resection of prostate      for BPH  . Tonsillectomy and adenoidectomy    . A-v cardiac pacemaker insertion  1995  . Colonoscopy  2008   Family History  Problem Relation Age of Onset  . COPD Mother   . Hypertension Mother   . Pneumonia Father   . Hypertension Other   . Heart disease Other    History  Substance Use Topics  . Smoking status: Never Smoker   . Smokeless tobacco: Never Used     Comment: tobacco use- no   . Alcohol Use: No    Review of Systems  Constitutional: Negative for activity change.       All ROS Neg except as noted in HPI  HENT: Positive for nosebleeds.   Eyes: Negative for photophobia and discharge.  Respiratory: Positive for shortness of breath. Negative for cough and wheezing.   Cardiovascular: Negative for chest pain and palpitations.  Gastrointestinal: Negative for abdominal pain and blood in stool.  Genitourinary: Negative for dysuria, frequency and hematuria.  Musculoskeletal: Negative for arthralgias, back pain and neck pain.  Skin: Negative.   Neurological: Positive for syncope. Negative for dizziness, seizures and speech difficulty.  Psychiatric/Behavioral: Negative for hallucinations and confusion.    Allergies  Enalapril; Metformin and related; and Penicillins  Home Medications   Current Outpatient Rx  Name  Route  Sig  Dispense  Refill  . albuterol (VENTOLIN HFA) 108 (90 BASE) MCG/ACT inhaler   Inhalation   Inhale 2 puffs into the lungs every 6 (six) hours as needed.           Marland Kitchen aspirin 81 MG tablet   Oral   Take 81 mg by mouth daily.           Marland Kitchen atenolol (TENORMIN) 50 MG tablet               . B-D UF III MINI PEN NEEDLES 31G X 5 MM MISC               . Blood Glucose Monitoring Suppl (ONE TOUCH ULTRA MINI) W/DEVICE KIT               . Ca Carbonate-Mag Hydroxide (MYLANTA SUPREME) 400-135 MG/5ML SUSP   Oral   Take by mouth as needed.           . cetirizine (ZYRTEC) 10 MG tablet   Oral   Take 10 mg by mouth  daily.         Marland Kitchen EASY TOUCH LANCETS 30G/TWIST MISC               . FLUVIRIN INJ injection               . Lancet Devices (EASY TOUCH LANCING DEVICE) MISC               . levothyroxine (LEVOXYL) 50 MCG tablet   Oral   Take 50 mcg by mouth daily.           . magnesium oxide (MAG-OX) 400 MG tablet   Oral   Take 400 mg by mouth daily.         . metoprolol tartrate (LOPRESSOR) 25 MG tablet   Oral   Take 1 tablet (25 mg total) by mouth 2 (two) times daily.   60 tablet   11   . Misc Natural Products (OSTEO BI-FLEX TRIPLE STRENGTH PO)   Oral   Take 1 tablet by mouth daily.         . mometasone (NASONEX) 50 MCG/ACT nasal spray   Nasal   Place 2 sprays into the nose daily.           . Multiple Vitamins-Minerals (CENTRUM) tablet   Oral   Take 1 tablet by mouth daily.           . nitroGLYCERIN (NITROSTAT) 0.4 MG SL tablet   Sublingual   Place 1 tablet (0.4 mg total) under the tongue every 5 (five) minutes as needed.   25 tablet   3   . NOVOLOG 100 UNIT/ML injection               . NOVOLOG MIX 70/30 FLEXPEN (70-30) 100 UNIT/ML SUPN               . omeprazole (PRILOSEC) 40 MG capsule   Oral   Take 1 capsule (40 mg total) by mouth daily.   30 capsule   11   . ONE TOUCH ULTRA TEST test strip               . PRODIGY INSULIN SYRINGE 31G X 5/16" 0.5 ML MISC                BP 149/63  Pulse 88  Temp(Src) 98 F (36.7 C) (Oral)  Resp 18  Ht 5\' 3"  (1.6 m)  Wt 199  lb (90.266 kg)  BMI 35.26 kg/m2  SpO2 90% Physical Exam  Nursing note and vitals reviewed. Constitutional: He is oriented to person, place, and time. He appears well-developed and well-nourished.  Non-toxic appearance.  HENT:  Head: Normocephalic.  Right Ear: Tympanic membrane and external ear normal.  Left Ear: Tympanic membrane and external ear normal.  There appears to be bilateral scratches at the floor of the anterior portion of right and left nares. There is a good  scab over each scratch. There is no active bleeding at this time.  Eyes: EOM and lids are normal. Pupils are equal, round, and reactive to light.  Neck: Normal range of motion. Neck supple. Carotid bruit is not present.  Cardiovascular: Normal rate, regular rhythm, intact distal pulses and normal pulses.   Murmur heard. Pulmonary/Chest: Breath sounds normal. No respiratory distress.  Abdominal: Soft. Bowel sounds are normal. There is no tenderness. There is no guarding.  Musculoskeletal: Normal range of motion.  Lymphadenopathy:       Head (right side): No submandibular adenopathy present.       Head (left side): No submandibular adenopathy present.    He has no cervical adenopathy.  Neurological: He is alert and oriented to person, place, and time. He has normal strength. No cranial nerve deficit or sensory deficit.  Skin: Skin is warm and dry.  Psychiatric: He has a normal mood and affect. His speech is normal.    ED Course  Procedures (including critical care time) Labs Review Labs Reviewed - No data to display Imaging Review No results found.  EKG Interpretation   None       MDM  No diagnosis found. **I have reviewed nursing notes, vital signs, and all appropriate lab and imaging results for this patient.*  Pt observed in ED. No bleeding noted. Pt to return if any changes or problem.  Kathie Dike, PA-C 08/17/13 [phone]

## 2013-08-14 NOTE — ED Notes (Signed)
Patient spouse states patient has been having a nosebleed off and on all day today.

## 2013-08-20 NOTE — ED Provider Notes (Signed)
Medical screening examination/treatment/procedure(s) were performed by non-physician practitioner and as supervising physician I was immediately available for consultation/collaboration.  EKG Interpretation   None        Donnetta Hutching, MD 08/20/13 [phone]

## 2013-08-31 ENCOUNTER — Ambulatory Visit (INDEPENDENT_AMBULATORY_CARE_PROVIDER_SITE_OTHER): Payer: PRIVATE HEALTH INSURANCE | Admitting: *Deleted

## 2013-08-31 ENCOUNTER — Encounter: Payer: Self-pay | Admitting: Internal Medicine

## 2013-08-31 DIAGNOSIS — I498 Other specified cardiac arrhythmias: Secondary | ICD-10-CM

## 2013-08-31 LAB — MDC_IDC_ENUM_SESS_TYPE_INCLINIC
Battery Impedance: 7115 Ohm
Battery Remaining Longevity: 1 mo
Date Time Interrogation Session: 20141212202321
Lead Channel Impedance Value: 533 Ohm
Lead Channel Setting Pacing Pulse Width: 0.4 ms

## 2013-08-31 NOTE — Progress Notes (Signed)
PPM check in office. 

## 2013-09-06 ENCOUNTER — Encounter: Payer: Self-pay | Admitting: Internal Medicine

## 2013-09-06 ENCOUNTER — Ambulatory Visit (INDEPENDENT_AMBULATORY_CARE_PROVIDER_SITE_OTHER): Payer: Medicare Other | Admitting: Internal Medicine

## 2013-09-06 ENCOUNTER — Encounter: Payer: Self-pay | Admitting: *Deleted

## 2013-09-06 VITALS — BP 114/96 | HR 80 | Ht 64.0 in | Wt 195.8 lb

## 2013-09-06 DIAGNOSIS — I459 Conduction disorder, unspecified: Secondary | ICD-10-CM

## 2013-09-06 DIAGNOSIS — Z45018 Encounter for adjustment and management of other part of cardiac pacemaker: Secondary | ICD-10-CM

## 2013-09-06 DIAGNOSIS — I5032 Chronic diastolic (congestive) heart failure: Secondary | ICD-10-CM

## 2013-09-06 DIAGNOSIS — Z950 Presence of cardiac pacemaker: Secondary | ICD-10-CM

## 2013-09-06 NOTE — Patient Instructions (Signed)
Need generator changed out in pacemaker  See instruction sheet

## 2013-09-07 ENCOUNTER — Other Ambulatory Visit: Payer: Self-pay | Admitting: *Deleted

## 2013-09-07 ENCOUNTER — Telehealth: Payer: Self-pay | Admitting: Internal Medicine

## 2013-09-07 DIAGNOSIS — Z45018 Encounter for adjustment and management of other part of cardiac pacemaker: Secondary | ICD-10-CM

## 2013-09-07 NOTE — Telephone Encounter (Signed)
ROI faxed to [facility], records rec back gave to [facility]  09/06/13/KM

## 2013-09-11 ENCOUNTER — Other Ambulatory Visit: Payer: Self-pay | Admitting: *Deleted

## 2013-09-11 DIAGNOSIS — I059 Rheumatic mitral valve disease, unspecified: Secondary | ICD-10-CM

## 2013-09-19 ENCOUNTER — Encounter: Payer: Self-pay | Admitting: Internal Medicine

## 2013-09-19 DIAGNOSIS — I5032 Chronic diastolic (congestive) heart failure: Secondary | ICD-10-CM | POA: Insufficient documentation

## 2013-09-19 NOTE — Progress Notes (Signed)
    HPI Mr. Hernan returns today for pacemaker followup. He is a very pleasant 52-year-old man with a history of diastolic heart failure, chronic chest pain, who underwent recent catheterization and was found to have no obstructive coronary disease. He has mild to moderate aortic stenosis. He has chronic class II heart failure symptoms thought secondary to diastolic heart failure. In the interim, he admits to some dietary indiscretion, and continues to wear home oxygen secondary to COPD. He also has sleep apnea and wears BiPAP. The patient admits to dietary indiscretion. He has had mild peripheral edema. At heart catheterization, his end-diastolic pressure was elevated, in the mid 20s. The patient has reached elective replacement on his pacemaker, and the device has reverted to VVI pacing at 65 beats per minute. Allergies  Allergen Reactions  . Enalapril Other (See Comments)    Cough  . Metformin And Related     Severe diaarhea  . Penicillins      Current Outpatient Prescriptions  Medication Sig Dispense Refill  . albuterol (VENTOLIN HFA) 108 (90 BASE) MCG/ACT inhaler Inhale 2 puffs into the lungs every 6 (six) hours as needed.        . aspirin 81 MG tablet Take 81 mg by mouth daily.        . B-D UF III MINI PEN NEEDLES 31G X 5 MM MISC AS DIRECTED      . Blood Glucose Monitoring Suppl (ONE TOUCH ULTRA MINI) W/DEVICE KIT AS DIRECTED      . cetirizine (ZYRTEC) 10 MG tablet Take 10 mg by mouth daily.      . EASY TOUCH LANCETS 30G/TWIST MISC AS DIRECTED      . Lancet Devices (EASY TOUCH LANCING DEVICE) MISC AS DIRECTED      . levothyroxine (LEVOXYL) 50 MCG tablet Take 50 mcg by mouth daily.        . magnesium oxide (MAG-OX) 400 MG tablet Take 400 mg by mouth daily.      . metoprolol tartrate (LOPRESSOR) 25 MG tablet Take 1 tablet (25 mg total) by mouth 2 (two) times daily.  60 tablet  11  . Misc Natural Products (OSTEO BI-FLEX TRIPLE STRENGTH PO) Take 1 tablet by mouth daily.      .  mometasone (NASONEX) 50 MCG/ACT nasal spray Place 2 sprays into the nose daily.        . Multiple Vitamins-Minerals (CENTRUM) tablet Take 1 tablet by mouth daily.        . nitroGLYCERIN (NITROSTAT) 0.4 MG SL tablet Place 1 tablet (0.4 mg total) under the tongue every 5 (five) minutes as needed.  25 tablet  3  . NOVOLOG 100 UNIT/ML injection Inject 5 Units into the skin 3 (three) times daily.       . NOVOLOG MIX 70/30 FLEXPEN (70-30) 100 UNIT/ML SUPN Inject 25 Units into the skin 2 (two) times daily.       . omeprazole (PRILOSEC) 40 MG capsule Take 1 capsule (40 mg total) by mouth daily.  30 capsule  11  . ONE TOUCH ULTRA TEST test strip       . PRODIGY INSULIN SYRINGE 31G X 5/16" 0.5 ML MISC AS DIRECTED       No current facility-administered medications for this visit.     Past Medical History  Diagnosis Date  . Chest pain 2007, 2009    cardiac cath > normal coronary arterie; nl left ventricular function  . Cardiac conduction disorder     status post   pacemaker implantation in 1995 generator replaced 2005  . Aortic stenosis     mild  . Mitral regurgitation     insignificant  . HTN (hypertension)   . Hyperlipidemia     statin discontinued due to abn LFTs  . Syncope   . Obesity   . Pelvic mass     identified in 2009, stable 2010  . 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 Diabetes mellitus     +Insulin  . Benign hypertrophy of prostate     with urinary retention; repaired hypospadia; transurethral resection of the prostate   . GERD (gastroesophageal reflux disease)     status post multiple dilatations for stricture  . Congenital deafness   . Sleep apnea     BiPAP and continuous oxygen  . Obesity 4/08    BMI= 40  . Anemia   . Alcohol use   . Other specified cardiac dysrhythmias(427.89)     ROS:   All systems reviewed and negative except as noted in the HPI.   Past Surgical History  Procedure Laterality Date   . Orchiectomy  1990    bilateral; ?neoplasm  . Repair hypospadias w/ urethroplasty    . Transurethral resection of prostate      for BPH  . Tonsillectomy and adenoidectomy    . A-v cardiac pacemaker insertion  1995  . Colonoscopy  2008     Family History  Problem Relation Age of Onset  . COPD Mother   . Hypertension Mother   . Pneumonia Father   . Hypertension Other   . Heart disease Other      History   Social History  . Marital Status: Married    Spouse Name: N/A    Number of Children: N/A  . Years of Education: N/A   Occupational History  . Not on file.   Social History Main Topics  . Smoking status: Never Smoker   . Smokeless tobacco: Never Used     Comment: tobacco use- no   . Alcohol Use: No  . Drug Use: No  . Sexual Activity: Not on file   Other Topics Concern  . Not on file   Social History Narrative   Part time.      BP 114/96  Pulse 80  Ht 5' 4" (1.626 m)  Wt 195 lb 12.8 oz (88.814 kg)  BMI 33.59 kg/m2  Physical Exam:  stable appearing middle-aged man, NAD HEENT: Unremarkable Neck:  No JVD, no thyromegally Back:  No CVA tenderness Lungs:  Clear except for scattered basilar rales HEART:  Regular rate rhythm, no murmurs, no rubs, no clicks, heart sounds are distant Abd:  soft, obese, positive bowel sounds, no organomegally, no rebound, no guarding Ext:  2 plus pulses, trace peripheral edema, no cyanosis, no clubbing Skin:  No rashes no nodules Neuro:  CN II through XII intact, motor grossly intact   DEVICE  Normal device function.  See PaceArt for details. Elective replacement reached 2 months ago  Assess/Plan: 

## 2013-09-19 NOTE — Assessment & Plan Note (Signed)
His device has reached elective replacement. We will schedule pacemaker removal and insertion of a new device.

## 2013-09-19 NOTE — Assessment & Plan Note (Signed)
The advent of the patient's device reaching elective replacement has resulted in worsening of his heart failure symptoms. I have discussed the treatment options with the patient and have recommended changing out his device. Additional recommendations regarding medical therapy for his diastolic heart failure will be forthcoming. I discussed the importance a low-sodium diet.

## 2013-09-25 ENCOUNTER — Telehealth: Payer: Self-pay | Admitting: Internal Medicine

## 2013-09-25 NOTE — Telephone Encounter (Signed)
Spoke w/pt's wife.  New insurance for 2015 is [facility].  Pt's wife questions can pt eat before echo.  Advised pt can eat before echo but will check w/nurse to ensure this is correct.  Pt also has questions about ASA before possible procedure next week.  Advised will send note to nurse for c/b on this issue. Per pt's wife ok to leave message with this info

## 2013-09-25 NOTE — Telephone Encounter (Signed)
Spoke with wife and let her know okay to take ASA 81mg  prior to procedure

## 2013-09-26 ENCOUNTER — Encounter: Payer: Self-pay | Admitting: Cardiovascular Disease

## 2013-09-26 ENCOUNTER — Ambulatory Visit (HOSPITAL_COMMUNITY): Payer: Medicare Other | Attending: Cardiovascular Disease | Admitting: Radiology

## 2013-09-26 ENCOUNTER — Encounter (HOSPITAL_COMMUNITY): Payer: Self-pay | Admitting: Pharmacy Technician

## 2013-09-26 ENCOUNTER — Other Ambulatory Visit (INDEPENDENT_AMBULATORY_CARE_PROVIDER_SITE_OTHER): Payer: Medicare Other

## 2013-09-26 DIAGNOSIS — G4730 Sleep apnea, unspecified: Secondary | ICD-10-CM | POA: Insufficient documentation

## 2013-09-26 DIAGNOSIS — I359 Nonrheumatic aortic valve disorder, unspecified: Secondary | ICD-10-CM | POA: Insufficient documentation

## 2013-09-26 DIAGNOSIS — I059 Rheumatic mitral valve disease, unspecified: Secondary | ICD-10-CM | POA: Insufficient documentation

## 2013-09-26 DIAGNOSIS — I509 Heart failure, unspecified: Secondary | ICD-10-CM | POA: Insufficient documentation

## 2013-09-26 DIAGNOSIS — I350 Nonrheumatic aortic (valve) stenosis: Secondary | ICD-10-CM

## 2013-09-26 DIAGNOSIS — R079 Chest pain, unspecified: Secondary | ICD-10-CM

## 2013-09-26 DIAGNOSIS — R609 Edema, unspecified: Secondary | ICD-10-CM | POA: Insufficient documentation

## 2013-09-26 DIAGNOSIS — Z45018 Encounter for adjustment and management of other part of cardiac pacemaker: Secondary | ICD-10-CM

## 2013-09-26 DIAGNOSIS — E785 Hyperlipidemia, unspecified: Secondary | ICD-10-CM | POA: Insufficient documentation

## 2013-09-26 DIAGNOSIS — I10 Essential (primary) hypertension: Secondary | ICD-10-CM | POA: Insufficient documentation

## 2013-09-26 DIAGNOSIS — I459 Conduction disorder, unspecified: Secondary | ICD-10-CM

## 2013-09-26 LAB — BASIC METABOLIC PANEL
BUN: 17 mg/dL (ref 6–23)
CALCIUM: 9.2 mg/dL (ref 8.4–10.5)
CO2: 30 mEq/L (ref 19–32)
Chloride: 99 mEq/L (ref 96–112)
Creatinine, Ser: 1 mg/dL (ref 0.4–1.5)
GFR: 80.18 mL/min (ref >60.00)
GLUCOSE: 254 mg/dL — AB (ref 70–99)
Potassium: 3.9 mEq/L (ref 3.5–5.1)
Sodium: 137 mEq/L (ref 135–145)

## 2013-09-26 LAB — CBC WITH DIFFERENTIAL/PLATELET
Basophils Absolute: 0.1 10*3/uL (ref 0.0–0.1)
Basophils Relative: 0.6 % (ref 0.0–3.0)
Eosinophils Absolute: 0.2 10*3/uL (ref 0.0–0.7)
Eosinophils Relative: 1.8 % (ref 0.0–5.0)
HCT: 44.4 % (ref 39.0–52.0)
HEMOGLOBIN: 14.8 g/dL (ref 13.0–17.0)
LYMPHS PCT: 21.4 % (ref 12.0–46.0)
Lymphs Abs: 2.1 10*3/uL (ref 0.7–4.0)
MCHC: 33.3 g/dL (ref 30.0–36.0)
MCV: 87.3 fl (ref 78.0–100.0)
MONOS PCT: 12.5 % — AB (ref 3.0–12.0)
Monocytes Absolute: 1.2 10*3/uL — ABNORMAL HIGH (ref 0.1–1.0)
Neutro Abs: 6.3 10*3/uL (ref 1.4–7.7)
Neutrophils Relative %: 63.7 % (ref 43.0–77.0)
Platelets: 288 10*3/uL (ref 150.0–400.0)
RBC: 5.09 Mil/uL (ref 4.22–5.81)
RDW: 16.7 % — AB (ref 11.5–14.6)
WBC: 10 10*3/uL (ref 4.5–10.5)

## 2013-09-26 NOTE — Progress Notes (Signed)
Echocardiogram performed.  

## 2013-09-27 ENCOUNTER — Other Ambulatory Visit: Payer: PRIVATE HEALTH INSURANCE

## 2013-10-03 MED ORDER — VANCOMYCIN HCL IN DEXTROSE 1-5 GM/200ML-% IV SOLN
1000.0000 mg | INTRAVENOUS | Status: DC
Start: 2013-10-03 — End: 2013-10-04
  Filled 2013-10-03 (×2): qty 200

## 2013-10-03 MED ORDER — GENTAMICIN SULFATE 40 MG/ML IJ SOLN
80.0000 mg | INTRAMUSCULAR | Status: DC
  Filled 2013-10-03 (×2): qty 2

## 2013-10-04 ENCOUNTER — Ambulatory Visit (HOSPITAL_COMMUNITY)
Admission: RE | Admit: 2013-10-04 | Discharge: 2013-10-04 | Disposition: A | Discharge status: Home / Self Care | Payer: Medicare Other | Source: Ambulatory Visit | Attending: Internal Medicine | Admitting: Internal Medicine

## 2013-10-04 ENCOUNTER — Encounter (HOSPITAL_COMMUNITY)
Admission: RE | Disposition: A | Discharge status: Home / Self Care | Payer: Self-pay | Source: Ambulatory Visit | Attending: Internal Medicine

## 2013-10-04 DIAGNOSIS — I495 Sick sinus syndrome: Secondary | ICD-10-CM

## 2013-10-04 DIAGNOSIS — J4489 Other specified chronic obstructive pulmonary disease: Secondary | ICD-10-CM | POA: Insufficient documentation

## 2013-10-04 DIAGNOSIS — J449 Chronic obstructive pulmonary disease, unspecified: Secondary | ICD-10-CM | POA: Insufficient documentation

## 2013-10-04 DIAGNOSIS — Z45018 Encounter for adjustment and management of other part of cardiac pacemaker: Secondary | ICD-10-CM

## 2013-10-04 DIAGNOSIS — Z7982 Long term (current) use of aspirin: Secondary | ICD-10-CM | POA: Insufficient documentation

## 2013-10-04 DIAGNOSIS — I10 Essential (primary) hypertension: Secondary | ICD-10-CM | POA: Insufficient documentation

## 2013-10-04 DIAGNOSIS — E669 Obesity, unspecified: Secondary | ICD-10-CM | POA: Insufficient documentation

## 2013-10-04 DIAGNOSIS — I359 Nonrheumatic aortic valve disorder, unspecified: Secondary | ICD-10-CM | POA: Insufficient documentation

## 2013-10-04 DIAGNOSIS — Z9981 Dependence on supplemental oxygen: Secondary | ICD-10-CM | POA: Insufficient documentation

## 2013-10-04 DIAGNOSIS — I5032 Chronic diastolic (congestive) heart failure: Secondary | ICD-10-CM | POA: Insufficient documentation

## 2013-10-04 DIAGNOSIS — Z794 Long term (current) use of insulin: Secondary | ICD-10-CM | POA: Insufficient documentation

## 2013-10-04 DIAGNOSIS — E119 Type 2 diabetes mellitus without complications: Secondary | ICD-10-CM | POA: Insufficient documentation

## 2013-10-04 DIAGNOSIS — G4730 Sleep apnea, unspecified: Secondary | ICD-10-CM | POA: Insufficient documentation

## 2013-10-04 DIAGNOSIS — Z6841 Body Mass Index (BMI) 40.0 and over, adult: Secondary | ICD-10-CM | POA: Insufficient documentation

## 2013-10-04 DIAGNOSIS — I509 Heart failure, unspecified: Secondary | ICD-10-CM | POA: Insufficient documentation

## 2013-10-04 HISTORY — PX: PERMANENT PACEMAKER GENERATOR CHANGE: SHX6022

## 2013-10-04 LAB — SURGICAL PCR SCREEN
MRSA, PCR: NEGATIVE
STAPHYLOCOCCUS AUREUS: POSITIVE — AB

## 2013-10-04 LAB — GLUCOSE, CAPILLARY
GLUCOSE-CAPILLARY: 230 mg/dL — AB (ref 70–99)
GLUCOSE-CAPILLARY: 200 mg/dL — AB (ref 70–99)
Glucose-Capillary: 235 mg/dL — ABNORMAL HIGH (ref 70–99)
Glucose-Capillary: 184 mg/dL — ABNORMAL HIGH (ref 70–99)

## 2013-10-04 SURGERY — PERMANENT PACEMAKER GENERATOR CHANGE
Anesthesia: LOCAL

## 2013-10-04 MED ORDER — LIDOCAINE HCL (PF) 1 % IJ SOLN
INTRAMUSCULAR | Status: AC
  Filled 2013-10-04: qty 60

## 2013-10-04 MED ORDER — SODIUM CHLORIDE 0.9 % IV SOLN: INTRAVENOUS | Status: DC

## 2013-10-04 MED ORDER — MUPIROCIN 2 % EX OINT
TOPICAL_OINTMENT | Freq: Two times a day (BID) | CUTANEOUS | Status: DC
  Filled 2013-10-04: qty 22

## 2013-10-04 MED ORDER — CHLORHEXIDINE GLUCONATE 4 % EX LIQD
60.0000 mL | Freq: Once | CUTANEOUS | Status: DC
  Filled 2013-10-04: qty 60

## 2013-10-04 MED ORDER — MUPIROCIN 2 % EX OINT
TOPICAL_OINTMENT | CUTANEOUS | Status: AC
  Filled 2013-10-04: qty 22

## 2013-10-04 MED ORDER — ACETAMINOPHEN 325 MG PO TABS: 325.0000 mg | ORAL_TABLET | ORAL | Status: DC | PRN

## 2013-10-04 MED ORDER — ONDANSETRON HCL 4 MG/2ML IJ SOLN: 4.0000 mg | Freq: Four times a day (QID) | INTRAMUSCULAR | Status: DC | PRN

## 2013-10-04 NOTE — Interval H&P Note (Signed)
History and Physical Interval Note:  10/04/2013 1:09 PM  [name]  has presented today for surgery, with the diagnosis of ERI  The various methods of treatment have been discussed with the patient and family. After consideration of risks, benefits and other options for treatment, the patient has consented to  Procedure(s): PERMANENT PACEMAKER GENERATOR CHANGE (N/A) as a surgical intervention .  [name] history has been reviewed, patient examined, no change in status, stable for surgery.  I have reviewed the patient's chart and labs.  Questions were answered to the patient's satisfaction.     Mikle Bosworth.D.

## 2013-10-04 NOTE — Discharge Instructions (Signed)
REMOVE OUTER DRESSING TOMORROW AND LEAVE STERI-STRIPS ON; NO NOT GET SITE WET FOR 7 DAYS; FOLLOW  PATIENT INSTRUCTIONS FOR MUPIROCIN APPLICATION; CALL DR TAYLOR'S OFFICE IF ANY BLEEDING,DRAINAGE,FEVER,PAIN,REDNESS, OR SWELLING AT SITE; CALL DR TAYLOR'S OFFICE IF ANY PROBLEMS,QUESTIONS, OR CONCERNS

## 2013-10-04 NOTE — CV Procedure (Signed)
EP Procedure Note:  Procedure: PPM removal and insertion of a new PPM with PPM pocket revision  Indication: S/p PPM at ERI in the setting of symptomatic sinus node dysfunction  Findings: After informed consent was obtained, the patient was taken to the diagnostic EP lab in the fasting state. After the usual preparation and draping the patient was given 30 cc of lidocaine. A 7 cm incision was made and electrocautery was utilized to dissect down to the facial plane. The PPM pocket was encased in calcified scar. He was debrided extensively. Electrocautery was utilized to provide hemostasis. The pocket was irrigated and revised being extended medially. The leads were evaluated. The RV lead had an elevated impedence in the bipolar mode but unipolar pacing was satisfactory but not optimal. Based on the amount of pacing and risk to the patient to place a new lead, it was decided to proceed with the changeout and not place a new RV lead. The new PPM generator was a medtronic Adapta L (serial number unavailable) and it was connected to the RV and RA lead. The incision was closed with 2 layers of vicryl suture.  Complications: none immediately  Conclusion: successful removal of a previously implanted DDD PM and insertion of a new DDD device without immediate complication.   Mikle Bosworth.D.

## 2013-10-04 NOTE — H&P (View-Only) (Signed)
HPI [name] returns today for pacemaker followup. He is a very pleasant 53 year old man with a history of diastolic heart failure, chronic chest pain, who underwent recent catheterization and was found to have no obstructive coronary disease. He has mild to moderate aortic stenosis. He has chronic class II heart failure symptoms thought secondary to diastolic heart failure. In the interim, he admits to some dietary indiscretion, and continues to wear home oxygen secondary to COPD. He also has sleep apnea and wears BiPAP. The patient admits to dietary indiscretion. He has had mild peripheral edema. At heart catheterization, his end-diastolic pressure was elevated, in the mid 20s. The patient has reached elective replacement on his pacemaker, and the device has reverted to VVI pacing at 65 beats per minute. Allergies  Allergen Reactions  . Enalapril Other (See Comments)    Cough  . Metformin And Related     Severe diaarhea  . Penicillins      Current Outpatient Prescriptions  Medication Sig Dispense Refill  . albuterol (VENTOLIN HFA) 108 (90 BASE) MCG/ACT inhaler Inhale 2 puffs into the lungs every 6 (six) hours as needed.        Marland Kitchen aspirin 81 MG tablet Take 81 mg by mouth daily.        . B-D UF III MINI PEN NEEDLES 31G X 5 MM MISC AS DIRECTED      . Blood Glucose Monitoring Suppl (ONE TOUCH ULTRA MINI) W/DEVICE KIT AS DIRECTED      . cetirizine (ZYRTEC) 10 MG tablet Take 10 mg by mouth daily.      Marland Kitchen EASY TOUCH LANCETS 30G/TWIST MISC AS DIRECTED      . Lancet Devices (EASY TOUCH LANCING DEVICE) MISC AS DIRECTED      . levothyroxine (LEVOXYL) 50 MCG tablet Take 50 mcg by mouth daily.        . magnesium oxide (MAG-OX) 400 MG tablet Take 400 mg by mouth daily.      . metoprolol tartrate (LOPRESSOR) 25 MG tablet Take 1 tablet (25 mg total) by mouth 2 (two) times daily.  60 tablet  11  . Misc Natural Products (OSTEO BI-FLEX TRIPLE STRENGTH PO) Take 1 tablet by mouth daily.      .  mometasone (NASONEX) 50 MCG/ACT nasal spray Place 2 sprays into the nose daily.        . Multiple Vitamins-Minerals (CENTRUM) tablet Take 1 tablet by mouth daily.        . nitroGLYCERIN (NITROSTAT) 0.4 MG SL tablet Place 1 tablet (0.4 mg total) under the tongue every 5 (five) minutes as needed.  25 tablet  3  . NOVOLOG 100 UNIT/ML injection Inject 5 Units into the skin 3 (three) times daily.       Marland Kitchen NOVOLOG MIX 70/30 FLEXPEN (70-30) 100 UNIT/ML SUPN Inject 25 Units into the skin 2 (two) times daily.       Marland Kitchen omeprazole (PRILOSEC) 40 MG capsule Take 1 capsule (40 mg total) by mouth daily.  30 capsule  11  . ONE TOUCH ULTRA TEST test strip       . PRODIGY INSULIN SYRINGE 31G X 5/16" 0.5 ML MISC AS DIRECTED       No current facility-administered medications for this visit.     Past Medical History  Diagnosis Date  . Chest pain 2007, 2009    cardiac cath > normal coronary arterie; nl left ventricular function  . Cardiac conduction disorder     status post  pacemaker implantation in 1995 generator replaced 2005  . Aortic stenosis     mild  . Mitral regurgitation     insignificant  . HTN (hypertension)   . Hyperlipidemia     statin discontinued due to abn LFTs  . Syncope   . Obesity   . Pelvic mass     identified in 2009, stable 2010  . 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 Diabetes mellitus     +Insulin  . Benign hypertrophy of prostate     with urinary retention; repaired hypospadia; transurethral resection of the prostate   . GERD (gastroesophageal reflux disease)     status post multiple dilatations for stricture  . Congenital deafness   . Sleep apnea     BiPAP and continuous oxygen  . Obesity 4/08    BMI= 40  . Anemia   . Alcohol use   . Other specified cardiac dysrhythmias(427.89)     ROS:   All systems reviewed and negative except as noted in the HPI.   Past Surgical History  Procedure Laterality Date   . Orchiectomy  1990    bilateral; ?neoplasm  . Repair hypospadias w/ urethroplasty    . Transurethral resection of prostate      for BPH  . Tonsillectomy and adenoidectomy    . A-v cardiac pacemaker insertion  1995  . Colonoscopy  2008     Family History  Problem Relation Age of Onset  . COPD Mother   . Hypertension Mother   . Pneumonia Father   . Hypertension Other   . Heart disease Other      History   Social History  . Marital Status: Married    Spouse Name: N/A    Number of Children: N/A  . Years of Education: N/A   Occupational History  . Not on file.   Social History Main Topics  . Smoking status: Never Smoker   . Smokeless tobacco: Never Used     Comment: tobacco use- no   . Alcohol Use: No  . Drug Use: No  . Sexual Activity: Not on file   Other Topics Concern  . Not on file   Social History Narrative   Part time.      BP 114/96  Pulse 80  Ht _0  (1.626 m)  Wt 195 lb 12.8 oz (88.814 kg)  BMI 33.59 kg/m2  Physical Exam:  stable appearing middle-aged man, NAD HEENT: Unremarkable Neck:  No JVD, no thyromegally Back:  No CVA tenderness Lungs:  Clear except for scattered basilar rales HEART:  Regular rate rhythm, no murmurs, no rubs, no clicks, heart sounds are distant Abd:  soft, obese, positive bowel sounds, no organomegally, no rebound, no guarding Ext:  2 plus pulses, trace peripheral edema, no cyanosis, no clubbing Skin:  No rashes no nodules Neuro:  CN II through XII intact, motor grossly intact   DEVICE  Normal device function.  See PaceArt for details. Elective replacement reached 2 months ago  Assess/Plan:

## 2013-10-05 ENCOUNTER — Encounter (HOSPITAL_COMMUNITY): Payer: Self-pay | Admitting: *Deleted

## 2013-10-15 ENCOUNTER — Encounter: Payer: Medicare Other | Admitting: Internal Medicine

## 2013-10-19 ENCOUNTER — Ambulatory Visit (INDEPENDENT_AMBULATORY_CARE_PROVIDER_SITE_OTHER): Payer: Medicare Other | Admitting: Internal Medicine

## 2013-10-19 ENCOUNTER — Encounter: Payer: Self-pay | Admitting: Internal Medicine

## 2013-10-19 VITALS — BP 114/66 | HR 79 | Ht 63.0 in | Wt 193.4 lb

## 2013-10-19 DIAGNOSIS — Z950 Presence of cardiac pacemaker: Secondary | ICD-10-CM

## 2013-10-19 DIAGNOSIS — I459 Conduction disorder, unspecified: Secondary | ICD-10-CM

## 2013-10-19 DIAGNOSIS — I4891 Unspecified atrial fibrillation: Secondary | ICD-10-CM | POA: Insufficient documentation

## 2013-10-19 DIAGNOSIS — I10 Essential (primary) hypertension: Secondary | ICD-10-CM

## 2013-10-19 DIAGNOSIS — I5032 Chronic diastolic (congestive) heart failure: Secondary | ICD-10-CM

## 2013-10-19 LAB — MDC_IDC_ENUM_SESS_TYPE_INCLINIC
Lead Channel Setting Pacing Amplitude: 2.25 V
Lead Channel Setting Pacing Amplitude: 2.5 V
Lead Channel Setting Pacing Pulse Width: 0.4 ms
MDC IDC SET LEADCHNL RV SENSING SENSITIVITY: 4 mV

## 2013-10-19 NOTE — Assessment & Plan Note (Addendum)
Pacemaker interrogation demonstrates that he has several minutes of atrial high rates of over 200 beats per minute. These are asymptomatic. He will undergo watchful waiting. He may ultimately require anticoagulation, but currently does not have enough evidence of atrial fibrillation to recommend anticoagulation.

## 2013-10-19 NOTE — Assessment & Plan Note (Signed)
His heart failure symptoms are currently class 2A. He will continue his current meds.

## 2013-10-19 NOTE — Progress Notes (Signed)
HPI [name] to returns today for followup of diastolic heart failure. He is a very pleasant 53 yo man with a h/o atrial fibrillation, symptomatic tachy-brady syndrome, status post pacemaker insertion, HTN, diastolic heart failure and syncope. He denies chest pain or sob. Minimal palpitations. No peripheral edema. He recently underwent pacemaker generator change and has felt well . Allergies  Allergen Reactions  . Enalapril Other (See Comments)    Cough  . Metformin And Related     Severe diaarhea  . Penicillins Other (See Comments)    Reaction unspecified     Current Outpatient Prescriptions  Medication Sig Dispense Refill  . albuterol (VENTOLIN HFA) 108 (90 BASE) MCG/ACT inhaler Inhale 2 puffs into the lungs every 6 (six) hours as needed for wheezing.       Marland Kitchen aspirin 81 MG tablet Take 81 mg by mouth daily.        . cetirizine (ZYRTEC) 10 MG tablet Take 10 mg by mouth daily.      . insulin lispro protamine-lispro (HUMALOG 75/25 MIX) (75-25) 100 UNIT/ML SUSP injection Inject 25 Units into the skin 2 (two) times daily with a meal.      . levothyroxine (LEVOXYL) 50 MCG tablet Take 50 mcg by mouth daily.        . metoprolol tartrate (LOPRESSOR) 25 MG tablet Take 1 tablet (25 mg total) by mouth 2 (two) times daily.  60 tablet  11  . Misc Natural Products (OSTEO BI-FLEX TRIPLE STRENGTH PO) Take 1 tablet by mouth daily.      . mometasone (NASONEX) 50 MCG/ACT nasal spray Place 2 sprays into the nose daily as needed (for congestion).       . Multiple Vitamins-Minerals (CENTRUM) tablet Take 1 tablet by mouth daily.        . nitroGLYCERIN (NITROSTAT) 0.4 MG SL tablet Place 0.4 mg under the tongue every 5 (five) minutes as needed for chest pain.      Marland Kitchen NOVOLOG 100 UNIT/ML injection Inject 5 Units into the skin 3 (three) times daily.       Marland Kitchen omeprazole (PRILOSEC) 40 MG capsule Take 1 capsule (40 mg total) by mouth daily.  30 capsule  11  . sodium chloride (OCEAN) 0.65 % SOLN nasal  spray Place 1 spray into both nostrils as needed for congestion.       No current facility-administered medications for this visit.     Past Medical History  Diagnosis Date  . Chest pain 2007, 2009    cardiac cath > normal coronary arterie; nl left ventricular function  . Cardiac conduction disorder     status post pacemaker implantation in 1995 generator replaced 2005  . Aortic stenosis     mild  . Mitral regurgitation     insignificant  . HTN (hypertension)   . Hyperlipidemia     statin discontinued due to abn LFTs  . Syncope   . Obesity   . Pelvic mass     identified in 2009, stable 2010  . 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 Diabetes mellitus     +Insulin  . Benign hypertrophy of prostate     with urinary retention; repaired hypospadia; transurethral resection of the prostate   . GERD (gastroesophageal reflux disease)     status post multiple dilatations for stricture  . Congenital deafness   . Sleep apnea     BiPAP and continuous  oxygen  . Obesity 4/08    BMI= 40  . Anemia   . Alcohol use     ROS:   All systems reviewed and negative except as noted in the HPI.   Past Surgical History  Procedure Laterality Date  . Orchiectomy  1990    bilateral; ?neoplasm  . Repair hypospadias w/ urethroplasty    . Transurethral resection of prostate      for BPH  . Tonsillectomy and adenoidectomy    . A-v cardiac pacemaker insertion  1995  . Colonoscopy  2008  . Pacemaker insertion  05/2004; 10/04/2013    MDT dual chamber pacemaker; gen change 10/04/2013 (MDT ADDRL1)     Family History  Problem Relation Age of Onset  . COPD Mother   . Hypertension Mother   . Pneumonia Father   . Hypertension Other   . Heart disease Other      History   Social History  . Marital Status: Married    Spouse Name: N/A    Number of Children: N/A  . Years of Education: N/A   Occupational History  . Not on file.     Social History Main Topics  . Smoking status: Never Smoker   . Smokeless tobacco: Never Used     Comment: tobacco use- no   . Alcohol Use: No  . Drug Use: No  . Sexual Activity: Not on file   Other Topics Concern  . Not on file   Social History Narrative   Part time.      BP 114/66  Pulse 79  Ht 5\' 3"  (1.6 m)  Wt 193 lb 6.4 oz (87.726 kg)  BMI 34.27 kg/m2  Physical Exam:  Well appearing middle aged man, NAD HEENT: Unremarkable Neck:  No JVD, no thyromegally Back:  No CVA tenderness Lungs:  Clear with no wheezes, rales, or rhonchi HEART:  IRegular rate rhythm, no murmurs, no rubs, no clicks Abd:  soft, positive bowel sounds, no organomegally, no rebound, no guarding Ext:  2 plus pulses, no edema, no cyanosis, no clubbing Skin:  No rashes no nodules Neuro:  CN II through XII intact, motor grossly intact   Assess/Plan:

## 2013-10-19 NOTE — Patient Instructions (Signed)
Your physician recommends that you schedule a follow-up appointment in: 1 year with Dr Lovena Le Dennis Bast will receive a reminder letter two months in advance reminding you to call and schedule your appointment. If you don't receive this letter, please contact our office.  Remote monitoring is used to monitor your Pacemaker or ICD from home. This monitoring reduces the number of office visits required to check your device to one time per year. It allows [name] to keep an eye on the functioning of your device to ensure it is working properly. You are scheduled for a device check from home on 01/23/2014. You may send your transmission at any time that day. If you have a wireless device, the transmission will be sent automatically. After your physician reviews your transmission, you will receive a postcard with your next transmission date.

## 2013-10-19 NOTE — Assessment & Plan Note (Signed)
His blood pressure is well controlled. He will continue his current medical therapy.

## 2013-10-25 ENCOUNTER — Inpatient Hospital Stay (HOSPITAL_COMMUNITY): Payer: Medicare Other

## 2013-10-25 ENCOUNTER — Encounter (HOSPITAL_COMMUNITY): Payer: Self-pay | Admitting: Emergency Medicine

## 2013-10-25 ENCOUNTER — Inpatient Hospital Stay (HOSPITAL_COMMUNITY)
Admission: EM | Admit: 2013-10-25 | Discharge: 2013-10-28 | DRG: 690 | Disposition: A | Discharge status: Home Health | Payer: Medicare Other | Attending: Internal Medicine | Admitting: Internal Medicine

## 2013-10-25 DIAGNOSIS — T368X5A Adverse effect of other systemic antibiotics, initial encounter: Secondary | ICD-10-CM | POA: Diagnosis not present

## 2013-10-25 DIAGNOSIS — Z950 Presence of cardiac pacemaker: Secondary | ICD-10-CM | POA: Diagnosis present

## 2013-10-25 DIAGNOSIS — R197 Diarrhea, unspecified: Secondary | ICD-10-CM | POA: Diagnosis not present

## 2013-10-25 DIAGNOSIS — G4730 Sleep apnea, unspecified: Secondary | ICD-10-CM | POA: Diagnosis present

## 2013-10-25 DIAGNOSIS — IMO0001 Reserved for inherently not codable concepts without codable children: Secondary | ICD-10-CM | POA: Diagnosis present

## 2013-10-25 DIAGNOSIS — I4891 Unspecified atrial fibrillation: Secondary | ICD-10-CM | POA: Diagnosis present

## 2013-10-25 DIAGNOSIS — H905 Unspecified sensorineural hearing loss: Secondary | ICD-10-CM | POA: Diagnosis present

## 2013-10-25 DIAGNOSIS — I509 Heart failure, unspecified: Secondary | ICD-10-CM | POA: Diagnosis present

## 2013-10-25 DIAGNOSIS — I4892 Unspecified atrial flutter: Secondary | ICD-10-CM | POA: Diagnosis present

## 2013-10-25 DIAGNOSIS — R310 Gross hematuria: Secondary | ICD-10-CM | POA: Diagnosis present

## 2013-10-25 DIAGNOSIS — E785 Hyperlipidemia, unspecified: Secondary | ICD-10-CM | POA: Diagnosis present

## 2013-10-25 DIAGNOSIS — Z7982 Long term (current) use of aspirin: Secondary | ICD-10-CM

## 2013-10-25 DIAGNOSIS — N390 Urinary tract infection, site not specified: Secondary | ICD-10-CM | POA: Diagnosis present

## 2013-10-25 DIAGNOSIS — K219 Gastro-esophageal reflux disease without esophagitis: Secondary | ICD-10-CM | POA: Diagnosis present

## 2013-10-25 DIAGNOSIS — D72829 Elevated white blood cell count, unspecified: Secondary | ICD-10-CM | POA: Diagnosis present

## 2013-10-25 DIAGNOSIS — Z9119 Patient's noncompliance with other medical treatment and regimen: Secondary | ICD-10-CM

## 2013-10-25 DIAGNOSIS — Z794 Long term (current) use of insulin: Secondary | ICD-10-CM

## 2013-10-25 DIAGNOSIS — Z880 Allergy status to penicillin: Secondary | ICD-10-CM

## 2013-10-25 DIAGNOSIS — J449 Chronic obstructive pulmonary disease, unspecified: Secondary | ICD-10-CM | POA: Diagnosis present

## 2013-10-25 DIAGNOSIS — E1165 Type 2 diabetes mellitus with hyperglycemia: Secondary | ICD-10-CM

## 2013-10-25 DIAGNOSIS — A498 Other bacterial infections of unspecified site: Secondary | ICD-10-CM | POA: Diagnosis present

## 2013-10-25 DIAGNOSIS — Z9981 Dependence on supplemental oxygen: Secondary | ICD-10-CM

## 2013-10-25 DIAGNOSIS — N400 Enlarged prostate without lower urinary tract symptoms: Secondary | ICD-10-CM | POA: Diagnosis present

## 2013-10-25 DIAGNOSIS — J9610 Chronic respiratory failure, unspecified whether with hypoxia or hypercapnia: Secondary | ICD-10-CM | POA: Diagnosis present

## 2013-10-25 DIAGNOSIS — I10 Essential (primary) hypertension: Secondary | ICD-10-CM | POA: Diagnosis present

## 2013-10-25 DIAGNOSIS — Z9079 Acquired absence of other genital organ(s): Secondary | ICD-10-CM

## 2013-10-25 DIAGNOSIS — J4489 Other specified chronic obstructive pulmonary disease: Secondary | ICD-10-CM | POA: Diagnosis present

## 2013-10-25 DIAGNOSIS — R319 Hematuria, unspecified: Secondary | ICD-10-CM

## 2013-10-25 DIAGNOSIS — Z91199 Patient's noncompliance with other medical treatment and regimen due to unspecified reason: Secondary | ICD-10-CM

## 2013-10-25 DIAGNOSIS — Z8249 Family history of ischemic heart disease and other diseases of the circulatory system: Secondary | ICD-10-CM

## 2013-10-25 DIAGNOSIS — R21 Rash and other nonspecific skin eruption: Secondary | ICD-10-CM | POA: Diagnosis not present

## 2013-10-25 DIAGNOSIS — Z87442 Personal history of urinary calculi: Secondary | ICD-10-CM

## 2013-10-25 DIAGNOSIS — H9190 Unspecified hearing loss, unspecified ear: Secondary | ICD-10-CM | POA: Diagnosis present

## 2013-10-25 DIAGNOSIS — I359 Nonrheumatic aortic valve disorder, unspecified: Secondary | ICD-10-CM | POA: Diagnosis present

## 2013-10-25 DIAGNOSIS — E119 Type 2 diabetes mellitus without complications: Secondary | ICD-10-CM | POA: Diagnosis present

## 2013-10-25 DIAGNOSIS — N3000 Acute cystitis without hematuria: Principal | ICD-10-CM | POA: Diagnosis present

## 2013-10-25 DIAGNOSIS — I5032 Chronic diastolic (congestive) heart failure: Secondary | ICD-10-CM | POA: Diagnosis present

## 2013-10-25 LAB — URINE MICROSCOPIC-ADD ON

## 2013-10-25 LAB — BASIC METABOLIC PANEL
BUN: 14 mg/dL (ref 6–23)
CALCIUM: 9.8 mg/dL (ref 8.4–10.5)
CO2: 29 meq/L (ref 19–32)
Chloride: 94 mEq/L — ABNORMAL LOW (ref 96–112)
Creatinine, Ser: 0.84 mg/dL (ref 0.50–1.35)
GFR calc Af Amer: >90 mL/min (ref >90)
GFR calc non Af Amer: >90 mL/min (ref >90)
Glucose, Bld: 225 mg/dL — ABNORMAL HIGH (ref 70–99)
POTASSIUM: 4.6 meq/L (ref 3.7–5.3)
Sodium: 136 mEq/L — ABNORMAL LOW (ref 137–147)

## 2013-10-25 LAB — URINALYSIS, ROUTINE W REFLEX MICROSCOPIC
Glucose, UA: 100 mg/dL — AB
Ketones, ur: NEGATIVE mg/dL
NITRITE: POSITIVE — AB
PH: 6 (ref 5.0–8.0)
Protein, ur: 100 mg/dL — AB
Specific Gravity, Urine: 1.025 (ref 1.005–1.030)
Urobilinogen, UA: 1 mg/dL (ref 0.0–1.0)

## 2013-10-25 LAB — CBC
HCT: 43.4 % (ref 39.0–52.0)
HEMOGLOBIN: 14.2 g/dL (ref 13.0–17.0)
MCH: 29.2 pg (ref 26.0–34.0)
MCHC: 32.7 g/dL (ref 30.0–36.0)
MCV: 89.3 fL (ref 78.0–100.0)
PLATELETS: 220 10*3/uL (ref 150–400)
RBC: 4.86 MIL/uL (ref 4.22–5.81)
RDW: 15.7 % — ABNORMAL HIGH (ref 11.5–15.5)
WBC: 16.5 10*3/uL — AB (ref 4.0–10.5)

## 2013-10-25 LAB — GLUCOSE, CAPILLARY: Glucose-Capillary: 126 mg/dL — ABNORMAL HIGH (ref 70–99)

## 2013-10-25 MED ORDER — CIPROFLOXACIN IN D5W 400 MG/200ML IV SOLN
INTRAVENOUS | Status: AC
  Filled 2013-10-25: qty 200

## 2013-10-25 MED ORDER — CIPROFLOXACIN IN D5W 400 MG/200ML IV SOLN
400.0000 mg | Freq: Once | INTRAVENOUS | Status: AC
  Administered 2013-10-25: 400 mg via INTRAVENOUS
  Filled 2013-10-25: qty 200

## 2013-10-25 MED ORDER — ASPIRIN 81 MG PO CHEW
81.0000 mg | CHEWABLE_TABLET | Freq: Every day | ORAL | Status: DC
  Administered 2013-10-26 – 2013-10-28 (×3): 81 mg via ORAL
  Filled 2013-10-25 (×3): qty 1

## 2013-10-25 MED ORDER — HYDROMORPHONE HCL PF 1 MG/ML IJ SOLN
0.5000 mg | INTRAMUSCULAR | Status: DC | PRN
  Administered 2013-10-26 (×2): 0.5 mg via INTRAVENOUS
  Filled 2013-10-25 (×2): qty 1

## 2013-10-25 MED ORDER — ALBUTEROL SULFATE (2.5 MG/3ML) 0.083% IN NEBU: 3.0000 mL | INHALATION_SOLUTION | Freq: Four times a day (QID) | RESPIRATORY_TRACT | Status: DC | PRN

## 2013-10-25 MED ORDER — OXYCODONE HCL 5 MG PO TABS
5.0000 mg | ORAL_TABLET | ORAL | Status: DC | PRN
  Administered 2013-10-26: 5 mg via ORAL
  Filled 2013-10-25: qty 1

## 2013-10-25 MED ORDER — ONDANSETRON HCL 4 MG/2ML IJ SOLN
4.0000 mg | Freq: Four times a day (QID) | INTRAMUSCULAR | Status: DC | PRN
  Administered 2013-10-26: 4 mg via INTRAVENOUS
  Filled 2013-10-25: qty 2

## 2013-10-25 MED ORDER — SODIUM CHLORIDE 0.9 % IV SOLN
INTRAVENOUS | Status: AC
  Administered 2013-10-26: 01:00:00 via INTRAVENOUS

## 2013-10-25 MED ORDER — HYDROMORPHONE HCL PF 1 MG/ML IJ SOLN
0.5000 mg | Freq: Once | INTRAMUSCULAR | Status: AC
  Administered 2013-10-25: 0.5 mg via INTRAVENOUS
  Filled 2013-10-25: qty 1

## 2013-10-25 MED ORDER — PANTOPRAZOLE SODIUM 40 MG PO TBEC
80.0000 mg | DELAYED_RELEASE_TABLET | Freq: Every day | ORAL | Status: DC
  Administered 2013-10-26 – 2013-10-28 (×3): 80 mg via ORAL
  Filled 2013-10-25 (×3): qty 2

## 2013-10-25 MED ORDER — SODIUM CHLORIDE 0.9 % IJ SOLN
3.0000 mL | Freq: Two times a day (BID) | INTRAMUSCULAR | Status: DC
  Administered 2013-10-26 – 2013-10-27 (×3): 3 mL via INTRAVENOUS

## 2013-10-25 MED ORDER — LORATADINE 10 MG PO TABS
10.0000 mg | ORAL_TABLET | Freq: Every day | ORAL | Status: DC
  Administered 2013-10-26 – 2013-10-28 (×3): 10 mg via ORAL
  Filled 2013-10-25 (×3): qty 1

## 2013-10-25 MED ORDER — CIPROFLOXACIN IN D5W 400 MG/200ML IV SOLN
400.0000 mg | Freq: Two times a day (BID) | INTRAVENOUS | Status: DC
  Administered 2013-10-26 – 2013-10-27 (×3): 400 mg via INTRAVENOUS
  Filled 2013-10-25 (×7): qty 200

## 2013-10-25 MED ORDER — ACETAMINOPHEN 650 MG RE SUPP: 650.0000 mg | Freq: Four times a day (QID) | RECTAL | Status: DC | PRN

## 2013-10-25 MED ORDER — LEVOTHYROXINE SODIUM 50 MCG PO TABS
50.0000 ug | ORAL_TABLET | Freq: Every day | ORAL | Status: DC
  Administered 2013-10-26 – 2013-10-28 (×3): 50 ug via ORAL
  Filled 2013-10-25 (×3): qty 1

## 2013-10-25 MED ORDER — SALINE SPRAY 0.65 % NA SOLN
1.0000 | NASAL | Status: DC | PRN
  Filled 2013-10-25: qty 44

## 2013-10-25 MED ORDER — ACETAMINOPHEN 325 MG PO TABS
650.0000 mg | ORAL_TABLET | Freq: Four times a day (QID) | ORAL | Status: DC | PRN
Start: 2013-10-25 — End: 2013-10-28

## 2013-10-25 MED ORDER — METOPROLOL TARTRATE 25 MG PO TABS
25.0000 mg | ORAL_TABLET | Freq: Two times a day (BID) | ORAL | Status: DC
  Administered 2013-10-26 – 2013-10-28 (×6): 25 mg via ORAL
  Filled 2013-10-25 (×6): qty 1

## 2013-10-25 MED ORDER — ONDANSETRON HCL 4 MG PO TABS: 4.0000 mg | ORAL_TABLET | Freq: Four times a day (QID) | ORAL | Status: DC | PRN

## 2013-10-25 MED ORDER — SALINE SPRAY 0.65 % NA SOLN
NASAL | Status: AC
  Filled 2013-10-25: qty 44

## 2013-10-25 MED ORDER — SODIUM CHLORIDE 0.9 % IV BOLUS (SEPSIS)
1000.0000 mL | Freq: Once | INTRAVENOUS | Status: AC
  Administered 2013-10-25: 1000 mL via INTRAVENOUS

## 2013-10-25 MED ORDER — INSULIN ASPART PROT & ASPART (70-30 MIX) 100 UNIT/ML ~~LOC~~ SUSP
45.0000 [IU] | Freq: Every day | SUBCUTANEOUS | Status: DC
  Administered 2013-10-26 – 2013-10-28 (×3): 45 [IU] via SUBCUTANEOUS

## 2013-10-25 MED ORDER — ONDANSETRON HCL 4 MG/2ML IJ SOLN
4.0000 mg | Freq: Once | INTRAMUSCULAR | Status: AC
  Administered 2013-10-25: 4 mg via INTRAVENOUS
  Filled 2013-10-25: qty 2

## 2013-10-25 MED ORDER — NITROGLYCERIN 0.4 MG SL SUBL: 0.4000 mg | SUBLINGUAL_TABLET | SUBLINGUAL | Status: DC | PRN

## 2013-10-25 MED ORDER — INSULIN ASPART PROT & ASPART (70-30 MIX) 100 UNIT/ML ~~LOC~~ SUSP
48.0000 [IU] | Freq: Every day | SUBCUTANEOUS | Status: DC
  Administered 2013-10-26 – 2013-10-27 (×2): 48 [IU] via SUBCUTANEOUS
  Filled 2013-10-25: qty 10

## 2013-10-25 NOTE — ED Provider Notes (Signed)
CSN: [id number]     Arrival date & time 10/25/13  1426 History  This chart was scribed for Maudry Diego, MD by Ludger Nutting, ED Scribe. This patient was seen in room [facility]06/[facility]06 and the patient's care was started 4:23 PM.    Chief Complaint  Patient presents with  . Abdominal Pain    Patient is a 53 y.o. male presenting with abdominal pain. The history is provided by the patient. No language interpreter was used.  Abdominal Pain Associated symptoms: no chest pain, no cough, no diarrhea, no fatigue and no hematuria     HPI Comments: [name] is a 53 y.o. male who presents to the Emergency Department complaining of constant, gradually worsening lower abdominal pain that began yesterday. He also has intermittent hematuria and 2 episodes of vomiting today. Pt states he had a pacemaker placed on 10/04/13.   Past Medical History  Diagnosis Date  . Chest pain 2007, 2009    cardiac cath > normal coronary arterie; nl left ventricular function  . Cardiac conduction disorder     status post pacemaker implantation in 1995 generator replaced 2005  . Aortic stenosis     mild  . Mitral regurgitation     insignificant  . HTN (hypertension)   . Hyperlipidemia     statin discontinued due to abn LFTs  . Syncope   . Obesity   . Pelvic mass     identified in 2009, stable 2010  . 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 Diabetes mellitus     +Insulin  . Benign hypertrophy of prostate     with urinary retention; repaired hypospadia; transurethral resection of the prostate   . GERD (gastroesophageal reflux disease)     status post multiple dilatations for stricture  . Congenital deafness   . Sleep apnea     BiPAP and continuous oxygen  . Obesity 4/08    BMI= 40  . Anemia   . Alcohol use    Past Surgical History  Procedure Laterality Date  . Orchiectomy  1990    bilateral; ?neoplasm  . Repair hypospadias w/ urethroplasty     . Transurethral resection of prostate      for BPH  . Tonsillectomy and adenoidectomy    . A-v cardiac pacemaker insertion  1995  . Colonoscopy  2008  . Pacemaker insertion  05/2004; 10/04/2013    MDT dual chamber pacemaker; gen change 10/04/2013 (MDT ADDRL1)   Family History  Problem Relation Age of Onset  . COPD Mother   . Hypertension Mother   . Pneumonia Father   . Hypertension Other   . Heart disease Other    History  Substance Use Topics  . Smoking status: Never Smoker   . Smokeless tobacco: Never Used     Comment: tobacco use- no   . Alcohol Use: No    Review of Systems  Constitutional: Negative for appetite change and fatigue.  HENT: Negative for congestion, ear discharge and sinus pressure.   Eyes: Negative for discharge.  Respiratory: Negative for cough.   Cardiovascular: Negative for chest pain.  Gastrointestinal: Positive for abdominal pain. Negative for diarrhea.  Genitourinary: Negative for frequency and hematuria.  Musculoskeletal: Negative for back pain.  Skin: Negative for rash.  Neurological: Negative for seizures and headaches.  Psychiatric/Behavioral: Negative for hallucinations.    Allergies  Enalapril; Metformin and related; and Penicillins  Home Medications   Current  Outpatient Rx  Name  Route  Sig  Dispense  Refill  . albuterol (VENTOLIN HFA) 108 (90 BASE) MCG/ACT inhaler   Inhalation   Inhale 2 puffs into the lungs every 6 (six) hours as needed for wheezing.          Marland Kitchen aspirin 81 MG tablet   Oral   Take 81 mg by mouth daily.           . cetirizine (ZYRTEC) 10 MG tablet   Oral   Take 10 mg by mouth daily.         . insulin lispro protamine-lispro (HUMALOG 75/25 MIX) (75-25) 100 UNIT/ML SUSP injection   Subcutaneous   Inject 25 Units into the skin 2 (two) times daily with a meal.         . levothyroxine (LEVOXYL) 50 MCG tablet   Oral   Take 50 mcg by mouth daily.           . metoprolol tartrate (LOPRESSOR) 25 MG  tablet   Oral   Take 1 tablet (25 mg total) by mouth 2 (two) times daily.   60 tablet   11   . Misc Natural Products (OSTEO BI-FLEX TRIPLE STRENGTH PO)   Oral   Take 1 tablet by mouth daily.         . mometasone (NASONEX) 50 MCG/ACT nasal spray   Nasal   Place 2 sprays into the nose daily as needed (for congestion).          . Multiple Vitamins-Minerals (CENTRUM) tablet   Oral   Take 1 tablet by mouth daily.           . nitroGLYCERIN (NITROSTAT) 0.4 MG SL tablet   Sublingual   Place 0.4 mg under the tongue every 5 (five) minutes as needed for chest pain.         Marland Kitchen NOVOLOG 100 UNIT/ML injection   Subcutaneous   Inject 5 Units into the skin 3 (three) times daily.          Marland Kitchen omeprazole (PRILOSEC) 40 MG capsule   Oral   Take 1 capsule (40 mg total) by mouth daily.   30 capsule   11   . sodium chloride (OCEAN) 0.65 % SOLN nasal spray   Each Nare   Place 1 spray into both nostrils as needed for congestion.          BP 141/100  Pulse 91  Temp(Src) 98.1 F (36.7 C) (Oral)  Resp 18  Ht 5\' 3"  (1.6 m)  Wt 176 lb (79.833 kg)  BMI 31.18 kg/m2  SpO2 93% Physical Exam  Nursing note and vitals reviewed. Constitutional: He is oriented to person, place, and time. He appears well-developed.  HENT:  Head: Normocephalic.  Eyes: Conjunctivae and EOM are normal. No scleral icterus.  Neck: Neck supple. No thyromegaly present.  Cardiovascular: Normal rate and regular rhythm.  Exam reveals no gallop and no friction rub.   No murmur heard. Pulmonary/Chest: Effort normal and breath sounds normal. No stridor. He has no wheezes. He has no rales. He exhibits no tenderness.  Abdominal: Soft. He exhibits no distension. There is tenderness (moderate, suprapubic). There is no rebound.  Musculoskeletal: Normal range of motion. He exhibits no edema.  Lymphadenopathy:    He has no cervical adenopathy.  Neurological: He is oriented to person, place, and time. He exhibits normal  muscle tone. Coordination normal.  Skin: No rash noted. No erythema.  Psychiatric: He has a normal mood and  affect. His behavior is normal.    ED Course  Procedures (including critical care time)  DIAGNOSTIC STUDIES: Oxygen Saturation is 93% on RA, adequate by my interpretation.    COORDINATION OF CARE: 4:27 PM Discussed treatment plan with pt at bedside and pt agreed to plan.   Labs Review Labs Reviewed  URINALYSIS, ROUTINE W REFLEX MICROSCOPIC - Abnormal; Notable for the following:    APPearance HAZY (*)    Glucose, UA 100 (*)    Hgb urine dipstick LARGE (*)    Bilirubin Urine SMALL (*)    Protein, ur 100 (*)    Nitrite POSITIVE (*)    Leukocytes, UA LARGE (*)    All other components within normal limits  CBC - Abnormal; Notable for the following:    WBC 16.5 (*)    RDW 15.7 (*)    All other components within normal limits  URINE MICROSCOPIC-ADD ON - Abnormal; Notable for the following:    Bacteria, UA MANY (*)    All other components within normal limits  URINE CULTURE  BASIC METABOLIC PANEL   Imaging Review No results found.  EKG Interpretation   None       MDM  Uti,  Admit The chart was scribed for me under my direct supervision.  I personally performed the history, physical, and medical decision making and all procedures in the evaluation of this patient.Maudry Diego, MD 10/25/13 [phone]

## 2013-10-25 NOTE — Progress Notes (Signed)
Patient refusing BIPAP at this time. Patient to call if he changes his mind.

## 2013-10-25 NOTE — ED Notes (Signed)
Lower abdominal pain which began yesterday. Noticed blood in urine yesterday. Vomited x 2 today.

## 2013-10-25 NOTE — Progress Notes (Signed)
Patient decided to wear BIPAP tonight. RRT brought unit in room, setup, and placed on patient for the night.

## 2013-10-25 NOTE — H&P (Addendum)
History and Physical  [name] SLH:[id number] DOB: [birth year] DOA: 10/25/2013  Referring physician: EDP PCP: Petra Kuba, MD  Outpatient Specialists:  1. Cardiology: Dr. Cristopher Peru  Chief Complaint: Abdominal pain and dysuria  HPI: [name] is a 53 y.o. 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 History is provided by patient and his spouse who is at the bedside. 2 days ago patient started complaining of lower abdominal pain which is progressively gotten worse. Abdominal pain is intermittent, sharp, nonradiating, made worse by micturition and associated with dysuria, hematuria, 2 episodes of nonbloody emesis, fevers (not recorded) and chills. Patient denies chest pain, cough, dyspnea or palpitations. In the ED, glucose 225, WBC 16.5 and urinalysis suggestive of urinary tract infection. Patient has been given a dose of IV Cipro (penicillin allergy) and hospitalist admission requested.   Review of Systems: All systems reviewed and apart from history of presenting illness, are negative.  Past Medical History  Diagnosis Date  . Chest pain 2007, 2009    cardiac cath > normal coronary arterie; nl left ventricular function  . Cardiac conduction disorder     status post pacemaker implantation in 1995 generator replaced 2005  . Aortic stenosis     mild  . Mitral regurgitation     insignificant  . HTN (hypertension)   . Hyperlipidemia     statin discontinued due to abn LFTs  . Syncope   . Obesity   . Pelvic mass     identified in 2009, stable 2010  . 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 Diabetes mellitus     +Insulin  . Benign  hypertrophy of prostate     with urinary retention; repaired hypospadia; transurethral resection of the prostate   . GERD (gastroesophageal reflux disease)     status post multiple dilatations for stricture  . Congenital deafness   . Sleep apnea     BiPAP and continuous oxygen  . Obesity 4/08    BMI= 40  . Anemia   . Alcohol use    Past Surgical History  Procedure Laterality Date  . Orchiectomy  1990    bilateral; ?neoplasm  . Repair hypospadias w/ urethroplasty    . Transurethral resection of prostate      for BPH  . Tonsillectomy and adenoidectomy    . A-v cardiac pacemaker insertion  1995  . Colonoscopy  2008  . Pacemaker insertion  05/2004; 10/04/2013    MDT dual chamber pacemaker; gen change 10/04/2013 (MDT ADDRL1)   Social History:  reports that he has never smoked. He has never used smokeless tobacco. He reports that he does not drink alcohol or use illicit drugs. Independent of activities of daily living. Apparently sustained a mechanical fall 2 days ago with scratch across the right elbow and no other injuries.  Allergies  Allergen Reactions  . Enalapril Other (See Comments)    Cough  . Metformin And Related     Severe diaarhea  . Penicillins Other (See Comments)    Reaction unspecified    Family History  Problem Relation Age of Onset  . COPD Mother   . Hypertension Mother   . Pneumonia Father   . Hypertension Other   .  Heart disease Other     Prior to Admission medications   Medication Sig Start Date End Date Taking? Authorizing Provider  albuterol (VENTOLIN HFA) 108 (90 BASE) MCG/ACT inhaler Inhale 2 puffs into the lungs every 6 (six) hours as needed for wheezing.    Yes Historical Provider, MD  aspirin 81 MG tablet Take 81 mg by mouth daily.     Yes Historical Provider, MD  cetirizine (ZYRTEC) 10 MG tablet Take 10 mg by mouth daily.   Yes Historical Provider, MD  insulin aspart protamine- aspart (NOVOLOG MIX 70/30) (70-30) 100 UNIT/ML injection Inject  45-48 Units into the skin 2 (two) times daily. 45 units with breakfast and 48 units with supper.   Yes Historical Provider, MD  levothyroxine (LEVOXYL) 50 MCG tablet Take 50 mcg by mouth daily.     Yes Historical Provider, MD  metoprolol tartrate (LOPRESSOR) 25 MG tablet Take 1 tablet (25 mg total) by mouth 2 (two) times daily. 07/30/13  Yes Arnoldo Lenis, MD  Misc Natural Products (OSTEO BI-FLEX TRIPLE STRENGTH PO) Take 1 tablet by mouth daily.   Yes Historical Provider, MD  mometasone (NASONEX) 50 MCG/ACT nasal spray Place 2 sprays into the nose daily as needed (for congestion).    Yes Historical Provider, MD  Multiple Vitamins-Minerals (CENTRUM) tablet Take 1 tablet by mouth daily.     Yes Historical Provider, MD  nitroGLYCERIN (NITROSTAT) 0.4 MG SL tablet Place 0.4 mg under the tongue every 5 (five) minutes as needed for chest pain.   Yes Historical Provider, MD  NOVOLOG 100 UNIT/ML injection Inject 5 Units into the skin 3 (three) times daily.  07/14/13  Yes Historical Provider, MD  omeprazole (PRILOSEC) 40 MG capsule Take 1 capsule (40 mg total) by mouth daily. 07/30/13  Yes Arnoldo Lenis, MD  sodium chloride (OCEAN) 0.65 % SOLN nasal spray Place 1 spray into both nostrils as needed (nose bleeds).    Yes Historical Provider, MD   Physical Exam: Filed Vitals:   10/25/13 1438 10/25/13 1714 10/25/13 1716 10/25/13 1718  BP: 141/100 129/92 140/80 137/75  Pulse: 91 110 113 117  Temp: 98.1 F (36.7 C)     TempSrc: Oral     Resp: 18     Height: 5\' 3"  (1.6 m)     Weight: 79.833 kg (176 lb)     SpO2: 93%        General exam: Moderately built and nourished middle-aged male patient, lying comfortably supine on the gurney in no obvious distress.  Head, eyes and ENT: Nontraumatic and normocephalic. Pupils equally reacting to light and accommodation. Oral mucosa moist.  Neck: Supple. No JVD, carotid bruit or thyromegaly.  Lymphatics: No lymphadenopathy.  Respiratory system: Clear to  auscultation. No increased work of breathing.  Cardiovascular system: S1 and S2 heard, RRR. No JVD, murmurs, gallops, clicks or pedal edema.  Gastrointestinal system: Abdomen is nondistended, soft. Mild tenderness in the suprapubic region without peritoneal signs. External genitalia examined and scrotal sacs without testicles but no acute findings. Normal bowel sounds heard. No organomegaly or masses appreciated.  Central nervous system: Alert and oriented. No focal neurological deficits.  Extremities: Symmetric 5 x 5 power. Peripheral pulses symmetrically felt. Chronic hyperpigmentation of skin of bilateral lower legs.  Skin: No rashes or acute findings.  Musculoskeletal system: Negative exam.  Psychiatry: Pleasant and cooperative.   Labs on Admission:  Basic Metabolic Panel:  Recent Labs Lab 10/25/13 1609  NA 136*  K 4.6  CL 94*  CO2 29  GLUCOSE 225*  BUN 14  CREATININE 0.84  CALCIUM 9.8   Liver Function Tests: No results found for this basename: AST, ALT, ALKPHOS, BILITOT, PROT, ALBUMIN,  in the last 168 hours No results found for this basename: LIPASE, AMYLASE,  in the last 168 hours No results found for this basename: AMMONIA,  in the last 168 hours CBC:  Recent Labs Lab 10/25/13 1609  WBC 16.5*  HGB 14.2  HCT 43.4  MCV 89.3  PLT 220   Cardiac Enzymes: No results found for this basename: CKTOTAL, CKMB, CKMBINDEX, TROPONINI,  in the last 168 hours  BNP (last 3 results) No results found for this basename: PROBNP,  in the last 8760 hours CBG: No results found for this basename: GLUCAP,  in the last 168 hours  Radiological Exams on Admission: No results found.  EKG: Was not done-requested 2  Assessment/Plan Principal Problem:   UTI (lower urinary tract infection) Active Problems:   DIABETES MELLITUS, TYPE II, ON INSULIN   DEAFNESS, CONGENITAL   GASTROESOPHAGEAL REFLUX DISEASE   Cardiac pacemaker in situ   Hypertension   Hyperlipidemia   Benign  hypertrophy of prostate   Sleep apnea   Chronic diastolic heart failure   Atrial fibrillation   1. UTI/acute cystitis: Continue IV Cipro pending urine culture results. His abdominal pain and hematuria are most likely secondary to the UTI but patient also has history of prior renal calculi in the pelvic mass. We'll obtain abdominal x-ray and renal ultrasound to look for renal calculi or other pathology. He has a chronic pelvic mass but not sure if contributing to current problem. 2. Leukocytosis: Secondary to problem #1. Follow CBCs\ 3. Uncontrolled IDDM: Continue home dose of insulin, check CBGs and monitor closely. 4. Sleep apnea: Continue nightly CPAP and oxygen. 5. Hypertension: Reasonably controlled. Continue home medications. 6. Status post permanent pacemaker. 7. Chronic diastolic CHF: Clinically slightly dry. Not on diuretics at home. 8. Nausea and vomiting x2: Likely secondary to problem #1. Try regular consistency diet, antiemetics and monitor. Continue PPI.      Code Status: Full  Family Communication: Discussed with spouse at bedside  Disposition Plan: Home when medically stable   Time spent: 20 minutes  Jaxyn Mestas, MD, FACP, FHM. [facility] Pager [phone]  If 7PM-7AM, please contact night-coverage [email or web address] Password [facility] 10/25/2013, 6:31 PM

## 2013-10-26 ENCOUNTER — Observation Stay (HOSPITAL_COMMUNITY): Payer: Medicare Other

## 2013-10-26 DIAGNOSIS — D72829 Elevated white blood cell count, unspecified: Secondary | ICD-10-CM | POA: Diagnosis present

## 2013-10-26 DIAGNOSIS — R319 Hematuria, unspecified: Secondary | ICD-10-CM | POA: Diagnosis present

## 2013-10-26 LAB — CBC
HEMATOCRIT: 39.7 % (ref 39.0–52.0)
Hemoglobin: 12.9 g/dL — ABNORMAL LOW (ref 13.0–17.0)
MCH: 29.7 pg (ref 26.0–34.0)
MCHC: 32.5 g/dL (ref 30.0–36.0)
MCV: 91.5 fL (ref 78.0–100.0)
Platelets: 198 10*3/uL (ref 150–400)
RBC: 4.34 MIL/uL (ref 4.22–5.81)
RDW: 15.9 % — AB (ref 11.5–15.5)
WBC: 11.1 10*3/uL — ABNORMAL HIGH (ref 4.0–10.5)

## 2013-10-26 LAB — BASIC METABOLIC PANEL
BUN: 14 mg/dL (ref 6–23)
CHLORIDE: 98 meq/L (ref 96–112)
CO2: 27 meq/L (ref 19–32)
CREATININE: 0.87 mg/dL (ref 0.50–1.35)
Calcium: 8.6 mg/dL (ref 8.4–10.5)
GFR calc Af Amer: >90 mL/min (ref >90)
GFR calc non Af Amer: >90 mL/min (ref >90)
Glucose, Bld: 269 mg/dL — ABNORMAL HIGH (ref 70–99)
Potassium: 3.9 mEq/L (ref 3.7–5.3)
Sodium: 137 mEq/L (ref 137–147)

## 2013-10-26 LAB — GLUCOSE, CAPILLARY
GLUCOSE-CAPILLARY: 129 mg/dL — AB (ref 70–99)
Glucose-Capillary: 312 mg/dL — ABNORMAL HIGH (ref 70–99)
Glucose-Capillary: 320 mg/dL — ABNORMAL HIGH (ref 70–99)
Glucose-Capillary: 153 mg/dL — ABNORMAL HIGH (ref 70–99)

## 2013-10-26 LAB — HEMOGLOBIN A1C
HEMOGLOBIN A1C: 10.3 % — AB (ref <5.7)
MEAN PLASMA GLUCOSE: 249 mg/dL — AB (ref <117)

## 2013-10-26 MED ORDER — INSULIN ASPART 100 UNIT/ML ~~LOC~~ SOLN
0.0000 [IU] | Freq: Three times a day (TID) | SUBCUTANEOUS | Status: DC
  Administered 2013-10-26: 7 [IU] via SUBCUTANEOUS
  Administered 2013-10-26: 1 [IU] via SUBCUTANEOUS
  Administered 2013-10-27 (×2): 2 [IU] via SUBCUTANEOUS
  Administered 2013-10-28 (×2): 1 [IU] via SUBCUTANEOUS

## 2013-10-26 MED ORDER — PHENAZOPYRIDINE HCL 100 MG PO TABS
100.0000 mg | ORAL_TABLET | Freq: Three times a day (TID) | ORAL | Status: AC
  Administered 2013-10-26 – 2013-10-28 (×6): 100 mg via ORAL
  Filled 2013-10-26 (×6): qty 1

## 2013-10-26 NOTE — Progress Notes (Addendum)
PROGRESS NOTE    [name] SRP:[id number] DOB: [birth year] DOA: 10/25/2013 PCP: Petra Kuba, MD Primary Cardiologist: Dr. Roderic Palau branch EPS Cardiologist: Dr. Cristopher Peru  HPI/Brief narrative 53 y.o. 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 In the ED, glucose 225, WBC 16.5 and urinalysis suggestive of urinary tract infection. Patient has been given a dose of IV Cipro (penicillin allergy) and hospitalist admission requested.  Assessment/Plan:  1. UTI/acute cystitis/hematuria: patient was started empirically on IV Cipro pending urine culture results. His abdominal pain and hematuria are most likely secondary to the UTI but patient also has history of prior renal calculi & a pelvic mass. He has a chronic pelvic mass but not sure if contributing to current problem. KUB-no calculi reported. Renal ultrasound-no hydronephrosis or definite renal calculi. Gross hematuria seems to have improved/resolved. Will need outpatient urology followup/consultation. Improving. Add pyridium for dysuria. 2. Leukocytosis: Secondary to problem #1. Improved. 3. Uncontrolled IDDM: Continue home dose of insulin, check CBGs and monitor closely. Diabetes coordinator input appreciated. We'll check A1c and add sensitive sliding scale insulin. 4. Sleep apnea/COPD: Continue nightly CPAP and oxygen. 5. Hypertension: Reasonably controlled. Continue home medications. 6. Status post permanent pacemaker. 7. Chronic diastolic CHF/chronic chest pain/mild to moderate aortic stenosis: Not on diuretics at home. Compensated. Recent cardiac catheterization-no obstructive coronary disease. Pacemaker recently replaced. 8. Nausea and vomiting: Likely  secondary to problem #1. Try regular consistency diet, antiemetics and monitor. Continue PPI. No further vomiting. Some nausea persists.   Code Status: Full  Family Communication: Discussed with spouse at bedside  Disposition Plan: Home when medically stable    Consultants:  None  Procedures:  None  Antibiotics:  IV Cipro 2/5 >   Subjective: Lower abdominal pain improved/resolved. Still has dysuria. Nausea but no vomiting.  Objective: Filed Vitals:   10/25/13 1718 10/25/13 2055 10/25/13 2357 10/26/13 0511  BP: 137/75 132/87 132/87 111/68  Pulse: 117 100 117 91  Temp:  97.9 F (36.6 C) 97.9 F (36.6 C) 97.5 F (36.4 C)  TempSrc:  Oral  Oral  Resp:  20 18 18   Height:  5\' 3"  (1.6 m)    Weight:  85.9 kg (189 lb 6 oz)  88.2 kg (194 lb 7.1 oz)  SpO2:  92% 93% 92%    Intake/Output Summary (Last 24 hours) at 10/26/13 1102 Last data filed at 10/26/13 0825  Gross per 24 hour  Intake    120 ml  Output    750 ml  Net   -630 ml   Filed Weights   10/25/13 1438 10/25/13 2055 10/26/13 0511  Weight: 79.833 kg (176 lb) 85.9 kg (189 lb 6 oz) 88.2 kg (194 lb 7.1 oz)     Exam:  General exam: Moderately built and nourished middle-aged male patient, lying in bed with mild painful discomfort. Respiratory system: Clear. No increased work of breathing. Cardiovascular system: S1 & S2 heard, RRR. No JVD, murmurs, gallops, clicks or pedal edema. Gastrointestinal system: Abdomen is nondistended, soft and nontender. Normal bowel sounds heard. Central nervous system: Alert and oriented. No focal neurological deficits. Extremities: Symmetric 5 x 5 power.   Data Reviewed: Basic Metabolic Panel:  Recent Labs Lab 10/25/13 1609 10/26/13 0522  NA 136* 137  K 4.6 3.9  CL 94* 98  CO2 29 27  GLUCOSE 225* 269*  BUN 14 14  CREATININE 0.84 0.87  CALCIUM 9.8 8.6   Liver Function Tests: No results found for this basename: AST, ALT, ALKPHOS, BILITOT, PROT, ALBUMIN,  in the last 168  hours No results found for this basename: LIPASE, AMYLASE,  in the last 168 hours No results found for this basename: AMMONIA,  in the last 168 hours CBC:  Recent Labs Lab 10/25/13 1609 10/26/13 0522  WBC 16.5* 11.1*  HGB 14.2 12.9*  HCT 43.4 39.7  MCV 89.3 91.5  PLT 220 198   Cardiac Enzymes: No results found for this basename: CKTOTAL, CKMB, CKMBINDEX, TROPONINI,  in the last 168 hours BNP (last 3 results) No results found for this basename: PROBNP,  in the last 8760 hours CBG:  Recent Labs Lab 10/25/13 2110 10/26/13 0727  GLUCAP 126* 312*    No results found for this or any previous visit (from the past 240 hour(s)).    Studies: Dg Abd 1 View  10/25/2013   CLINICAL DATA:  53 year old male with abdominal pain and hematuria. Initial encounter.  EXAM: ABDOMEN - 1 VIEW  COMPARISON:  CT Abdomen and Pelvis 12/19/2007.  FINDINGS: Two supine views. Non obstructed bowel gas pattern. Cardiac pacemaker leads partially visible. No definite pneumoperitoneum. No acute osseous abnormality identified. Calcified flank injection granulomas. No definite urologic calculus. No acute osseous abnormality identified.  IMPRESSION: Non obstructed bowel gas pattern.   Electronically Signed   By: Lars Pinks M.D.   On: 10/25/2013 19:06   US Renal  10/26/2013   CLINICAL DATA:  Hematuria.  EXAM: RENAL/URINARY TRACT ULTRASOUND COMPLETE  COMPARISON:  CT abdomen and pelvis 12/19/2007  FINDINGS: Right Kidney:  Length: 10.4 cm. As demonstrated on prior CT, areas of scarring are again seen. There is a 5 mm linear echogenic focus in the interpolar kidney without shadowing. No definite calculi are identified. There is no hydronephrosis.  Left Kidney:  Length: 12.0 cm. Echogenicity within normal limits. No mass or hydronephrosis visualized.  Bladder:  Appears normal for degree of bladder distention. A right ureteral jet was identified. A left ureteral jet was not seen during the examination.  IMPRESSION: No  hydronephrosis or definite renal calculi.  Right renal scarring.   Electronically Signed   By: Logan Bores   On: 10/26/2013 09:56        Scheduled Meds: . aspirin  81 mg Oral Daily  . ciprofloxacin  400 mg Intravenous Q12H  . insulin aspart protamine- aspart  45 Units Subcutaneous Q breakfast  . insulin aspart protamine- aspart  48 Units Subcutaneous Q supper  . levothyroxine  50 mcg Oral QAC breakfast  . loratadine  10 mg Oral Daily  . metoprolol tartrate  25 mg Oral BID  . pantoprazole  80 mg Oral Daily  . sodium chloride  3 mL Intravenous Q12H   Continuous Infusions:   Principal Problem:   UTI (lower urinary tract infection) Active Problems:   DIABETES MELLITUS, TYPE II, ON INSULIN   DEAFNESS, CONGENITAL   GASTROESOPHAGEAL REFLUX DISEASE   Cardiac pacemaker in situ   Hypertension   Hyperlipidemia   Benign hypertrophy of prostate   Sleep apnea   Chronic diastolic heart failure   Atrial fibrillation    Time spent: 21 minutes    HONGALGI,ANAND, MD, FACP, FHM. [facility] Pager [phone]  If 7PM-7AM, please contact night-coverage [email or web address] Password [email or web address] 10/26/2013, 11:02 AM    LOS: 1 day   Pt started having itching  and red rash up arm that cipro was infusing into (3rd round of cipro).  Pt has allergy to pcn consisting of rash also.  Stop cipro, give benadryl.  Cr normal.  Will place on bactrim.  Urine cx is pending.  Do not know if he ever has had ceftriaxone.Marland KitchenMarland KitchenMarland Kitchen

## 2013-10-26 NOTE — Care Management Note (Addendum)
    Page 1 of 1   10/26/2013     12:59:31 PM   CARE MANAGEMENT NOTE 10/26/2013  Patient:  [name], [name]   Account Number:  [id number]  Date Initiated:  10/26/2013  Documentation initiated by:  Theophilus Kinds  Subjective/Objective Assessment:   Pt admitted from home with UTI. Pt lives with his wife and will return home at discharge. Pt is fairly independent with ADL's. Pt has bipap and home O2 with [employer].     Action/Plan:   Pts wife would like [place] RN at discharge. Pts wife chose [facility] (pt given list of option for [facility]). Schuyler Amor of [facility] is aware and will collect the pts information from the chart. Hh services to start within 48 hours of d/c.   Anticipated DC Date:  10/29/2013   Anticipated DC Plan:  [place]  CM consult      [facility] Choice  HOME HEALTH   Choice offered to / List presented to:  C-3 Spouse        HH arranged  HH-1 RN      [place].   Status of service:  Completed, signed off Medicare Important Message given?  YES (If response is "NO", the following Medicare IM given date fields will be blank) Date Medicare IM given:  10/26/2013 Date Additional Medicare IM given:    Discharge Disposition:  [place]  Per UR Regulation:    If discussed at Long Length of Stay Meetings, dates discussed:    Comments:  10/26/13 [place], RN BSN CM

## 2013-10-26 NOTE — Progress Notes (Signed)
UR chart review completed.  

## 2013-10-26 NOTE — Progress Notes (Signed)
Inpatient Diabetes Program Recommendations  AACE/ADA: New Consensus Statement on Inpatient Glycemic Control (2013)  Target Ranges:  Prepandial:   less than 140 mg/dL      Peak postprandial:   less than 180 mg/dL (1-2 hours)      Critically ill patients:  140 - 180 mg/dL    Results for CONSTANTINE, RUDDICK (MRN [id number]) as of 10/26/2013 10:31  Ref. Range 10/25/2013 21:10 10/26/2013 07:27  Glucose-Capillary Latest Range: 70-99 mg/dL 126 (H) 312 (H)   Diabetes history: DM2 Outpatient Diabetes medications:  70/30 45 units with breakfast, 70/30 48 units with supper, and Novolog 5 units with meals Current orders for Inpatient glycemic control: 70/30 45 units with breakfast and 70/30 48 units with supper  Inpatient Diabetes Program Recommendations Correction (SSI): Please consider ordering Novolog sensitive correction scale TID with meals. HgbA1C: Please order an A1C to evaluate glycemic control over the past 2-3 months.  Note: Called patient's room to talk with patient and wife answered phone and said patient was unavailable at the moment.  Talked with patient's wife to verify insulin dosages because noted in chart that Dr. Lovena Le with [facility] had a office note dated 09/19/13 that reports pt was taking 70/30 25 units BID and Novolog 5 units TID with meals.  Patient's wife states that insulin dosages for the 70/30 were increased recently because blood glucose was running higher.  She reports that patient is indeed taking 70/30 45 units with breakfast, 70/30 48 units with supper, and Novolog 5 units with meals (which is as reported on home medication list).  Do not recommend using meal coverage with 70/30 but would recommend using a correction scale with 70/30 to improve inpatient glycemic control.  Will continue to follow.  Thanks, Barnie Alderman, RN, MSN, CCRN Diabetes Coordinator Inpatient Diabetes Program [phone] (Team Pager) [phone] (AP office) [phone] [facility] office)

## 2013-10-27 LAB — GLUCOSE, CAPILLARY
GLUCOSE-CAPILLARY: 153 mg/dL — AB (ref 70–99)
GLUCOSE-CAPILLARY: 170 mg/dL — AB (ref 70–99)
Glucose-Capillary: 110 mg/dL — ABNORMAL HIGH (ref 70–99)
Glucose-Capillary: 201 mg/dL — ABNORMAL HIGH (ref 70–99)

## 2013-10-27 LAB — URINE CULTURE

## 2013-10-27 MED ORDER — DIPHENHYDRAMINE HCL 25 MG PO CAPS
25.0000 mg | ORAL_CAPSULE | Freq: Four times a day (QID) | ORAL | Status: DC | PRN
  Administered 2013-10-27: 25 mg via ORAL
  Filled 2013-10-27: qty 1

## 2013-10-27 MED ORDER — SULFAMETHOXAZOLE-TMP DS 800-160 MG PO TABS
1.0000 | ORAL_TABLET | Freq: Two times a day (BID) | ORAL | Status: DC
  Administered 2013-10-27 – 2013-10-28 (×3): 1 via ORAL
  Filled 2013-10-27 (×3): qty 1

## 2013-10-27 NOTE — Progress Notes (Signed)
Patient c/o redness and itching above iv site. Cipro infusing. Red rash noted along the vein. Stopped the infusion and contacted hospitalist on call. Orders received to d/c Cipro. Will give benadryl. Will continue to monitor.

## 2013-10-27 NOTE — Progress Notes (Deleted)
PROGRESS NOTE   This was the progress note that had been done on 10/26/2013 by this MD. An addendum was made by Dr. Tarry Kos on 10/27/13 at 5:43 AM which took over my progress note from 10/26/2012. Following is the PN from 10/26/2013.  [name] WPV:[id number] DOB: 01/20/1961 DOA: 10/25/2013 PCP: Arlyss Queen, MD Primary Cardiologist: Dr. Christiane Ha [facility] EPS Cardiologist: Dr. Lewayne Bunting  HPI/Brief narrative 53 y.o. 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 In the ED, glucose 225, WBC 16.5 and urinalysis suggestive of urinary tract infection. Patient has been given a dose of IV Cipro (penicillin allergy) and hospitalist admission requested.  Assessment/Plan:  1. UTI/acute cystitis/hematuria: patient was started empirically on IV Cipro pending urine culture results. His abdominal pain and hematuria are most likely secondary to the UTI but patient also has history of prior renal calculi & a pelvic mass. He has a chronic pelvic mass but not sure if contributing to current problem. KUB-no calculi reported. Renal ultrasound-no hydronephrosis or definite renal calculi. Gross hematuria seems to have improved/resolved. Will need outpatient urology followup/consultation. Improving. Add pyridium for dysuria. 2. Leukocytosis: Secondary to problem #1. Improved. 3. Uncontrolled IDDM: Continue home dose of insulin, check CBGs and monitor closely. Diabetes coordinator input appreciated. We'll check A1c and add sensitive sliding scale insulin. 4. Sleep apnea/COPD: Continue nightly CPAP and oxygen. 5. Hypertension: Reasonably controlled. Continue home medications. 6. Status post permanent pacemaker. 7. Chronic diastolic CHF/chronic  chest pain/mild to moderate aortic stenosis: Not on diuretics at home. Compensated. Recent cardiac catheterization-no obstructive coronary disease. Pacemaker recently replaced. 8. Nausea and vomiting: Likely secondary to problem #1. Try regular consistency diet, antiemetics and monitor. Continue PPI. No further vomiting. Some nausea persists.   Code Status: Full  Family Communication: Discussed with spouse at bedside  Disposition Plan: Home when medically stable    Consultants:  None  Procedures:  None  Antibiotics:  IV Cipro 2/5 >   Subjective: Lower abdominal pain improved/resolved. Still has dysuria. Nausea but no vomiting.  Objective: Filed Vitals:   10/26/13 2253 10/27/13 0441 10/27/13 0500 10/27/13 0512  BP:    136/90  Pulse: 66 64    Temp:  97.3 F (36.3 C)    TempSrc:  Axillary    Resp: 20 20    Height:      Weight:   87.272 kg (192 lb 6.4 oz)   SpO2: 89% 90%      Intake/Output Summary (Last 24 hours) at 10/27/13 0731 Last data filed at 10/27/13 0547  Gross per 24 hour  Intake    480 ml  Output   1700 ml  Net  -1220 ml   Filed Weights   10/25/13 2055 10/26/13 0511 10/27/13 0500  Weight: 85.9 kg (189 lb 6 oz) 88.2 kg (194 lb 7.1 oz) 87.272 kg (192 lb 6.4 oz)     Exam:  General exam: Moderately built and nourished middle-aged male patient, lying in bed with mild painful discomfort. Respiratory system: Clear. No increased work of breathing. Cardiovascular system: S1 & S2 heard, RRR. No JVD, murmurs, gallops, clicks or pedal edema. Gastrointestinal system: Abdomen is nondistended, soft and nontender. Normal bowel sounds heard. Central nervous system: Alert and oriented. No focal neurological deficits. Extremities: Symmetric 5 x 5 power.  Data Reviewed: Basic Metabolic Panel:  Recent Labs Lab 10/25/13 1609 10/26/13 0522  NA 136* 137  K 4.6 3.9  CL 94* 98  CO2 29 27  GLUCOSE 225* 269*  BUN 14 14  CREATININE 0.84 0.87  CALCIUM 9.8 8.6    Liver Function Tests: No results found for this basename: AST, ALT, ALKPHOS, BILITOT, PROT, ALBUMIN,  in the last 168 hours No results found for this basename: LIPASE, AMYLASE,  in the last 168 hours No results found for this basename: AMMONIA,  in the last 168 hours CBC:  Recent Labs Lab 10/25/13 1609 10/26/13 0522  WBC 16.5* 11.1*  HGB 14.2 12.9*  HCT 43.4 39.7  MCV 89.3 91.5  PLT 220 198   Cardiac Enzymes: No results found for this basename: CKTOTAL, CKMB, CKMBINDEX, TROPONINI,  in the last 168 hours BNP (last 3 results) No results found for this basename: PROBNP,  in the last 8760 hours CBG:  Recent Labs Lab 10/25/13 2110 10/26/13 0727 10/26/13 1136 10/26/13 1727 10/26/13 2146  GLUCAP 126* 312* 320* 129* 153*    Recent Results (from the past 240 hour(s))  URINE CULTURE     Status: None   Collection Time    10/25/13  2:45 PM      Result Value Range Status   Specimen Description URINE, CLEAN CATCH   Final   Special Requests NONE   Final   Culture  Setup Time     Final   Value: 10/25/2013 17:58     Performed at [facility]     Final   Value: >=100,000 COLONIES/ML     Performed at [facility]   Culture     Final   Value: ESCHERICHIA COLI     Performed at [facility]   Report Status 10/27/2013 FINAL   Final   Organism ID, Bacteria ESCHERICHIA COLI   Final      Studies: Dg Abd 1 View  10/25/2013   CLINICAL DATA:  53 year old male with abdominal pain and hematuria. Initial encounter.  EXAM: ABDOMEN - 1 VIEW  COMPARISON:  CT Abdomen and Pelvis 12/19/2007.  FINDINGS: Two supine views. Non obstructed bowel gas pattern. Cardiac pacemaker leads partially visible. No definite pneumoperitoneum. No acute osseous abnormality identified. Calcified flank injection granulomas. No definite urologic calculus. No acute osseous abnormality identified.  IMPRESSION: Non obstructed bowel gas pattern.   Electronically Signed   By: Lars Pinks  M.D.   On: 10/25/2013 19:06   US Renal  10/26/2013   CLINICAL DATA:  Hematuria.  EXAM: RENAL/URINARY TRACT ULTRASOUND COMPLETE  COMPARISON:  CT abdomen and pelvis 12/19/2007  FINDINGS: Right Kidney:  Length: 10.4 cm. As demonstrated on prior CT, areas of scarring are again seen. There is a 5 mm linear echogenic focus in the interpolar kidney without shadowing. No definite calculi are identified. There is no hydronephrosis.  Left Kidney:  Length: 12.0 cm. Echogenicity within normal limits. No mass or hydronephrosis visualized.  Bladder:  Appears normal for degree of bladder distention. A right ureteral jet was identified. A left ureteral jet was not seen during the examination.  IMPRESSION: No hydronephrosis or definite renal calculi.  Right renal scarring.   Electronically Signed   By: Logan Bores   On: 10/26/2013 09:56        Scheduled Meds: . aspirin  81 mg Oral Daily  . insulin aspart  0-9 Units Subcutaneous TID WC  . insulin aspart protamine- aspart  45 Units Subcutaneous Q breakfast  . insulin aspart protamine- aspart  48 Units Subcutaneous Q supper  . levothyroxine  50 mcg Oral QAC breakfast  . loratadine  10 mg Oral Daily  . metoprolol tartrate  25 mg Oral BID  . pantoprazole  80 mg Oral Daily  . phenazopyridine  100 mg Oral TID WC  . sodium chloride  3 mL Intravenous Q12H  . sulfamethoxazole-trimethoprim  1 tablet Oral Q12H   Continuous Infusions:   Principal Problem:   UTI (lower urinary tract infection) Active Problems:   DIABETES MELLITUS, TYPE II, ON INSULIN   DEAFNESS, CONGENITAL   GASTROESOPHAGEAL REFLUX DISEASE   Cardiac pacemaker in situ   Hypertension   Hyperlipidemia   Benign hypertrophy of prostate   Sleep apnea   Chronic diastolic heart failure   Atrial fibrillation   Leukocytosis, unspecified   Hematuria    Time spent: 65 minutes    Ronniesha Seibold, MD, FACP, FHM. [facility] Pager [phone]  If 7PM-7AM, please contact  night-coverage [email or web address] Password [email or web address] 10/27/2013, 7:31 AM    LOS: 2 days

## 2013-10-27 NOTE — Progress Notes (Signed)
On 4L of O2 via [place] pt is sating at 92%  On RA pt is sating at 86%  When put back on 4L of O2 via  pt returns to 92% oxygen saturation.  Will continue to monitor.

## 2013-10-27 NOTE — Progress Notes (Addendum)
PROGRESS NOTE   This was the progress note that had been done on 10/26/2013 by this MD. An addendum was made by Dr. Derrill Kay on 10/27/13 at 5:43 AM which took over my progress note from 10/26/2012. Following is the PN from 10/26/2013.  [name] [id number] DOB: [birth year] DOA: 10/25/2013 PCP: Petra Kuba, MD Primary Cardiologist: Dr. Roderic Palau branch EPS Cardiologist: Dr. Cristopher Peru  HPI/Brief narrative 53 y.o. 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 In the ED, glucose 225, WBC 16.5 and urinalysis suggestive of urinary tract infection. Patient has been given a dose of IV Cipro (penicillin allergy) and hospitalist admission requested.  Assessment/Plan:  1. UTI/acute cystitis/hematuria: patient was started empirically on IV Cipro pending urine culture results. His abdominal pain and hematuria are most likely secondary to the UTI but patient also has history of prior renal calculi & a pelvic mass. He has a chronic pelvic mass but not sure if contributing to current problem. KUB-no calculi reported. Renal ultrasound-no hydronephrosis or definite renal calculi. Gross hematuria seems to have improved/resolved. Will need outpatient urology followup/consultation. Improving. Add pyridium for dysuria. 2. Leukocytosis: Secondary to problem #1. Improved. 3. Uncontrolled IDDM: Continue home dose of insulin, check CBGs and monitor closely. Diabetes coordinator input appreciated. We'll check A1c and add sensitive sliding scale insulin. 4. Sleep apnea/COPD: Continue nightly CPAP and oxygen. 5. Hypertension: Reasonably controlled. Continue home medications. 6. Status post permanent pacemaker. 7. Chronic diastolic CHF/chronic  chest pain/mild to moderate aortic stenosis: Not on diuretics at home. Compensated. Recent cardiac catheterization-no obstructive coronary disease. Pacemaker recently replaced. 8. Nausea and vomiting: Likely secondary to problem #1. Try regular consistency diet, antiemetics and monitor. Continue PPI. No further vomiting. Some nausea persists.   Code Status: Full  Family Communication: Discussed with spouse at bedside  Disposition Plan: Home when medically stable    Consultants:  None  Procedures:  None  Antibiotics:  IV Cipro 2/5 >   Subjective: Lower abdominal pain improved/resolved. Still has dysuria. Nausea but no vomiting.  Objective: Filed Vitals:   10/26/13 2253 10/27/13 0441 10/27/13 0500 10/27/13 0512  BP:    136/90  Pulse: 66 64    Temp:  97.3 F (36.3 C)    TempSrc:  Axillary    Resp: 20 20    Height:      Weight:   87.272 kg (192 lb 6.4 oz)   SpO2: 89% 90%      Intake/Output Summary (Last 24 hours) at 10/27/13 0730 Last data filed at 10/27/13 0547  Gross per 24 hour  Intake    480 ml  Output   1700 ml  Net  -1220 ml   Filed Weights   10/25/13 2055 10/26/13 0511 10/27/13 0500  Weight: 85.9 kg (189 lb 6 oz) 88.2 kg (194 lb 7.1 oz) 87.272 kg (192 lb 6.4 oz)     Exam:  General exam: Moderately built and nourished middle-aged male patient, lying in bed with mild painful discomfort. Respiratory system: Clear. No increased work of breathing. Cardiovascular system: S1 & S2 heard, RRR. No JVD, murmurs, gallops, clicks or pedal edema. Gastrointestinal system: Abdomen is nondistended, soft and nontender. Normal bowel sounds heard. Central nervous system: Alert and oriented. No focal neurological deficits. Extremities: Symmetric 5 x 5 power.  Data Reviewed: Basic Metabolic Panel:  Recent Labs Lab 10/25/13 1609 10/26/13 0522  NA 136* 137  K 4.6 3.9  CL 94* 98  CO2 29 27  GLUCOSE 225* 269*  BUN 14 14  CREATININE 0.84 0.87  CALCIUM 9.8 8.6    Liver Function Tests: No results found for this basename: AST, ALT, ALKPHOS, BILITOT, PROT, ALBUMIN,  in the last 168 hours No results found for this basename: LIPASE, AMYLASE,  in the last 168 hours No results found for this basename: AMMONIA,  in the last 168 hours CBC:  Recent Labs Lab 10/25/13 1609 10/26/13 0522  WBC 16.5* 11.1*  HGB 14.2 12.9*  HCT 43.4 39.7  MCV 89.3 91.5  PLT 220 198   Cardiac Enzymes: No results found for this basename: CKTOTAL, CKMB, CKMBINDEX, TROPONINI,  in the last 168 hours BNP (last 3 results) No results found for this basename: PROBNP,  in the last 8760 hours CBG:  Recent Labs Lab 10/25/13 2110 10/26/13 0727 10/26/13 1136 10/26/13 1727 10/26/13 2146  GLUCAP 126* 312* 320* 129* 153*    Recent Results (from the past 240 hour(s))  URINE CULTURE     Status: None   Collection Time    10/25/13  2:45 PM      Result Value Range Status   Specimen Description URINE, CLEAN CATCH   Final   Special Requests NONE   Final   Culture  Setup Time     Final   Value: 10/25/2013 17:58     Performed at [facility]     Final   Value: >=100,000 COLONIES/ML     Performed at [facility]   Culture     Final   Value: ESCHERICHIA COLI     Performed at [facility]   Report Status 10/27/2013 FINAL   Final   Organism ID, Bacteria ESCHERICHIA COLI   Final      Studies: Dg Abd 1 View  10/25/2013   CLINICAL DATA:  53 year old male with abdominal pain and hematuria. Initial encounter.  EXAM: ABDOMEN - 1 VIEW  COMPARISON:  CT Abdomen and Pelvis 12/19/2007.  FINDINGS: Two supine views. Non obstructed bowel gas pattern. Cardiac pacemaker leads partially visible. No definite pneumoperitoneum. No acute osseous abnormality identified. Calcified flank injection granulomas. No definite urologic calculus. No acute osseous abnormality identified.  IMPRESSION: Non obstructed bowel gas pattern.   Electronically Signed   By: Lars Pinks  M.D.   On: 10/25/2013 19:06   US Renal  10/26/2013   CLINICAL DATA:  Hematuria.  EXAM: RENAL/URINARY TRACT ULTRASOUND COMPLETE  COMPARISON:  CT abdomen and pelvis 12/19/2007  FINDINGS: Right Kidney:  Length: 10.4 cm. As demonstrated on prior CT, areas of scarring are again seen. There is a 5 mm linear echogenic focus in the interpolar kidney without shadowing. No definite calculi are identified. There is no hydronephrosis.  Left Kidney:  Length: 12.0 cm. Echogenicity within normal limits. No mass or hydronephrosis visualized.  Bladder:  Appears normal for degree of bladder distention. A right ureteral jet was identified. A left ureteral jet was not seen during the examination.  IMPRESSION: No hydronephrosis or definite renal calculi.  Right renal scarring.   Electronically Signed   By: Logan Bores   On: 10/26/2013 09:56        Scheduled Meds: . aspirin  81 mg Oral Daily  . insulin aspart  0-9 Units Subcutaneous TID WC  . insulin aspart protamine- aspart  45 Units Subcutaneous Q breakfast  . insulin aspart protamine- aspart  48 Units Subcutaneous Q supper  . levothyroxine  50 mcg Oral QAC breakfast  . loratadine  10 mg Oral Daily  . metoprolol tartrate  25 mg Oral BID  . pantoprazole  80 mg Oral Daily  . phenazopyridine  100 mg Oral TID WC  . sodium chloride  3 mL Intravenous Q12H  . sulfamethoxazole-trimethoprim  1 tablet Oral Q12H   Continuous Infusions:   Principal Problem:   UTI (lower urinary tract infection) Active Problems:   DIABETES MELLITUS, TYPE II, ON INSULIN   DEAFNESS, CONGENITAL   GASTROESOPHAGEAL REFLUX DISEASE   Cardiac pacemaker in situ   Hypertension   Hyperlipidemia   Benign hypertrophy of prostate   Sleep apnea   Chronic diastolic heart failure   Atrial fibrillation   Leukocytosis, unspecified   Hematuria    Time spent: 83 minutes    Amylee Lodato, MD, FACP, FHM. [facility] Pager [phone]  If 7PM-7AM, please contact  night-coverage [email or web address] Password [email or web address] 10/27/2013, 7:30 AM    LOS: 2 days

## 2013-10-27 NOTE — Progress Notes (Addendum)
PROGRESS NOTE   This was the progress note that had been done on 10/26/2013 by this MD. An addendum was made by Dr. Derrill Kay on 10/27/13 at 5:43 AM which took over my progress note from 10/26/2012. Following is the PN from 10/26/2013.  [name] [id number] DOB: [birth year] DOA: 10/25/2013 PCP: Petra Kuba, MD Primary Cardiologist: Dr. Roderic Palau branch EPS Cardiologist: Dr. Cristopher Peru  HPI/Brief narrative 52 y.o. 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 In the ED, glucose 225, WBC 16.5 and urinalysis suggestive of urinary tract infection. Patient has been given a dose of IV Cipro (penicillin allergy) and hospitalist admission requested.  Assessment/Plan:  1. Escherichia coli UTI/acute cystitis/hematuria: patient was started empirically on IV Cipro pending urine culture results. His abdominal pain and hematuria were most likely secondary to the UTI but patient also has history of prior renal calculi & a pelvic mass. He has a chronic pelvic mass but not sure if contributing to current problem. KUB-no calculi reported. Renal ultrasound-no hydronephrosis or definite renal calculi. Gross hematuria seems to resolved. Will need outpatient urology followup/consultation. Improving. Add pyridium for dysuria-dysuria has improved. Patient developed apparent allergic reaction to IV Cipro-and was changed early this morning to oral Bactrim. Complete total week of antibiotics. 2. Leukocytosis: Secondary to problem #1. Improved. 3. Uncontrolled IDDM: Continue home dose of insulin, check CBGs and monitor closely. Diabetes coordinator input appreciated. Added sliding scale insulin-much improved control. Hemoglobin A1c 10.3 suggesting  poor outpatient control. 4. Sleep apnea/COPD: Continue nightly CPAP and oxygen. 5. Hypertension: Reasonably controlled. Continue home medications. 6. Status post permanent pacemaker. Admission EKG: ? Aflutter- will repeat EKG 7. Chronic diastolic CHF/chronic chest pain/mild to moderate aortic stenosis: Not on diuretics at home. Compensated. Recent cardiac catheterization-no obstructive coronary disease. Pacemaker recently replaced. 8. Nausea and vomiting: Likely secondary to problem #1. Try regular consistency diet, antiemetics and monitor. Continue PPI. Resolved. 9. Diarrhea: Patient had 2 episodes of diarrhea this morning. Monitor and further workup if he continues to have more. 10. 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 He was last seen by his EPS cardiologist on 10/19/13 at which time pacemaker interrogation demonstrated asymptomatic several minutes of atrial high rates of over 200 beats per minute and cardiologist recommended watchful waiting and stated that he may ultimately require anticoagulation but did not have enough evidence of A. fib at that time to recommend anticoagulation. Continue aspirin and metoprolol. Followup outpatient with his cardiologist.   Code Status: Full  Family Communication: Discussed with spouse at bedside  Disposition Plan: Home possibly 2/8    Consultants:  None  Procedures:  None  Antibiotics:  IV Cipro 2/5 > 2/7  Oral Bactrim 2/7 >   Subjective: Diarrhea x2 this morning. No abdominal pain, nausea or vomiting. Dysuria improved. Overall feels better.  Objective: Filed Vitals:   10/27/13 0500 10/27/13 0512 10/27/13 0808 10/27/13 1426  BP:  136/90  126/73  Pulse:    68  Temp:    97.6 F (36.4 C)  TempSrc:      Resp:    20  Height:      Weight: 87.272 kg (192 lb 6.4 oz)     SpO2:   91%  91%    Intake/Output  Summary (Last 24 hours) at 10/27/13 1550 Last data filed at 10/27/13 1253  Gross per 24 hour  Intake    720 ml  Output   1250 ml  Net   -530 ml   Filed Weights   10/25/13 2055 10/26/13 0511 10/27/13 0500  Weight: 85.9 kg (189 lb 6 oz) 88.2 kg (194 lb 7.1 oz) 87.272 kg (192 lb 6.4 oz)     Exam:  General exam: Moderately built and nourished middle-aged male patient, sitting up at age of bed, pleasant, smiling and in no obvious distress. Looks much better since previous days. Respiratory system: Clear. No increased work of breathing. Cardiovascular system: S1 & S2 heard, RRR. No JVD, murmurs, gallops, clicks or pedal edema. Gastrointestinal system: Abdomen is nondistended, soft and nontender. Normal bowel sounds heard. Central nervous system: Alert and oriented. No focal neurological deficits. Hard of hearing. Extremities: Symmetric 5 x 5 power.   Data Reviewed: Basic Metabolic Panel:  Recent Labs Lab 10/25/13 1609 10/26/13 0522  NA 136* 137  K 4.6 3.9  CL 94* 98  CO2 29 27  GLUCOSE 225* 269*  BUN 14 14  CREATININE 0.84 0.87  CALCIUM 9.8 8.6   Liver Function Tests: No results found for this basename: AST, ALT, ALKPHOS, BILITOT, PROT, ALBUMIN,  in the last 168 hours No results found for this basename: LIPASE, AMYLASE,  in the last 168 hours No results found for this basename: AMMONIA,  in the last 168 hours CBC:  Recent Labs Lab 10/25/13 1609 10/26/13 0522  WBC 16.5* 11.1*  HGB 14.2 12.9*  HCT 43.4 39.7  MCV 89.3 91.5  PLT 220 198   Cardiac Enzymes: No results found for this basename: CKTOTAL, CKMB, CKMBINDEX, TROPONINI,  in the last 168 hours BNP (last 3 results) No results found for this basename: PROBNP,  in the last 8760 hours CBG:  Recent Labs Lab 10/26/13 1136 10/26/13 1727 10/26/13 2146 10/27/13 0743 10/27/13 1146  GLUCAP 320* 129* 153* 110* 153*    Recent Results (from the past 240 hour(s))  URINE CULTURE     Status: None   Collection Time      10/25/13  2:45 PM      Result Value Range Status   Specimen Description URINE, CLEAN CATCH   Final   Special Requests NONE   Final   Culture  Setup Time     Final   Value: 10/25/2013 17:58     Performed at [facility]     Final   Value: >=100,000 COLONIES/ML     Performed at [facility]   Culture     Final   Value: ESCHERICHIA COLI     Performed at [facility]   Report Status 10/27/2013 FINAL   Final   Organism ID, Bacteria ESCHERICHIA COLI   Final      Studies: Dg Abd 1 View  10/25/2013   CLINICAL DATA:  53 year old male with abdominal pain and hematuria. Initial encounter.  EXAM: ABDOMEN - 1 VIEW  COMPARISON:  CT Abdomen and Pelvis 12/19/2007.  FINDINGS: Two supine views. Non obstructed bowel gas pattern. Cardiac pacemaker leads partially visible. No definite pneumoperitoneum. No acute osseous abnormality identified. Calcified flank injection granulomas. No definite urologic calculus. No acute osseous abnormality identified.  IMPRESSION: Non obstructed bowel gas pattern.   Electronically Signed   By: Lars Pinks M.D.   On: 10/25/2013 19:06   US Renal  10/26/2013   CLINICAL DATA:  Hematuria.  EXAM: RENAL/URINARY TRACT ULTRASOUND COMPLETE  COMPARISON:  CT abdomen and pelvis 12/19/2007  FINDINGS: Right Kidney:  Length: 10.4 cm. As demonstrated on prior CT, areas of scarring are again seen. There is a 5 mm linear echogenic focus in the interpolar kidney without shadowing. No definite calculi are identified. There is no hydronephrosis.  Left Kidney:  Length: 12.0 cm. Echogenicity within normal limits. No mass or hydronephrosis visualized.  Bladder:  Appears normal for degree of bladder distention. A right ureteral jet was identified. A left ureteral jet was not seen during the examination.  IMPRESSION: No hydronephrosis or definite renal calculi.  Right renal scarring.   Electronically Signed   By: Logan Bores   On: 10/26/2013 09:56         Scheduled Meds: . aspirin  81 mg Oral Daily  . insulin aspart  0-9 Units Subcutaneous TID WC  . insulin aspart protamine- aspart  45 Units Subcutaneous Q breakfast  . insulin aspart protamine- aspart  48 Units Subcutaneous Q supper  . levothyroxine  50 mcg Oral QAC breakfast  . loratadine  10 mg Oral Daily  . metoprolol tartrate  25 mg Oral BID  . pantoprazole  80 mg Oral Daily  . phenazopyridine  100 mg Oral TID WC  . sodium chloride  3 mL Intravenous Q12H  . sulfamethoxazole-trimethoprim  1 tablet Oral Q12H   Continuous Infusions:   Principal Problem:   UTI (lower urinary tract infection) Active Problems:   DIABETES MELLITUS, TYPE II, ON INSULIN   DEAFNESS, CONGENITAL   GASTROESOPHAGEAL REFLUX DISEASE   Cardiac pacemaker in situ   Hypertension   Hyperlipidemia   Benign hypertrophy of prostate   Sleep apnea   Chronic diastolic heart failure   Atrial fibrillation   Leukocytosis, unspecified   Hematuria    Time spent: 47 minutes    Clanton Emanuelson, MD, FACP, FHM. [facility] Pager [phone]  If 7PM-7AM, please contact night-coverage [email or web address] Password [email or web address] 10/27/2013, 3:50 PM    LOS: 2 days

## 2013-10-28 LAB — GLUCOSE, CAPILLARY
GLUCOSE-CAPILLARY: 128 mg/dL — AB (ref 70–99)
Glucose-Capillary: 142 mg/dL — ABNORMAL HIGH (ref 70–99)

## 2013-10-28 MED ORDER — SULFAMETHOXAZOLE-TMP DS 800-160 MG PO TABS: 1.0000 | ORAL_TABLET | Freq: Two times a day (BID) | ORAL | Status: DC

## 2013-10-28 NOTE — Discharge Summary (Signed)
Physician Discharge Summary  [name] [id number] DOB: [birth year] DOA: 10/25/2013  PCP: Petra Kuba, MD Primary Cardiologist: Dr. Carlyle Dolly EPS Cardiologist: Dr. Cristopher Peru  Admit date: 10/25/2013 Discharge date: 10/28/2013  Time spent: Greater than 30 minutes  Recommendations for Outpatient Follow-up:  1. Dr. Sena Hitch, PCP on 10/31/13-keep her prior appointment. To be seen with repeat labs (CBC & BMP). Recommend outpatient urology consultation. 2. Dr. Carlyle Dolly, Cardiology: Keep her previous appointment on 10/30/13 at 10:40 AM. Please address anticoagulation needs.  3. Home health RN 4. Patient and spouse have been counseled to make sure that he is compliant with nightly CPAP and continuous nasal oxygen.  Discharge Diagnoses:  Principal Problem:   UTI (lower urinary tract infection) Active Problems:   DIABETES MELLITUS, TYPE II, ON INSULIN   DEAFNESS, CONGENITAL   GASTROESOPHAGEAL REFLUX DISEASE   Cardiac pacemaker in situ   Hypertension   Hyperlipidemia   Benign hypertrophy of prostate   Sleep apnea   Chronic diastolic heart failure   Atrial fibrillation   Leukocytosis, unspecified   Hematuria   Discharge Condition: Improved & Stable  Diet recommendation: Diabetic and heart healthy diet.  Filed Weights   10/26/13 0511 10/27/13 0500 10/28/13 0456  Weight: 88.2 kg (194 lb 7.1 oz) 87.272 kg (192 lb 6.4 oz) 89.676 kg (197 lb 11.2 oz)    History of present illness:  53 y.o. 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 In the ED, glucose 225, WBC 16.5 and urinalysis suggestive of urinary tract infection. Patient was been given a dose of IV Cipro (penicillin allergy) and  hospitalist admission requested.  Hospital Course:   1. Escherichia coli UTI/acute cystitis/hematuria: patient was started empirically on IV Cipro pending urine culture results. His abdominal pain and hematuria were most likely secondary to the UTI but patient also has history of prior renal calculi & a pelvic mass. He has a chronic pelvic mass but not sure if contributing to current problem. KUB-no calculi reported. Renal ultrasound-no hydronephrosis or definite renal calculi. Gross hematuria and dysuria have resolved. Will need outpatient urology followup/consultation. Improved. Patient developed apparent allergic reaction to IV Cipro-and was changed to oral Bactrim (Escherichia coli sensitive to both Cipro and Bactrim). Complete total week of antibiotics. 2. Leukocytosis: Secondary to problem #1. Improved. 3. Uncontrolled IDDM: Continue home dose of insulin, check CBGs and monitor closely. Diabetes coordinator input appreciated. Added sliding scale insulin-much improved control. Hemoglobin A1c 10.3 suggesting poor outpatient control. Patient has required minimum doses of sliding scale insulin. Not sure if he is compliant with his diet. Continue current home dose of insulin which may need outpatient titration. 4. Sleep apnea/COPD/chronic hypoxic respiratory failure: Continue nightly CPAP and oxygen. Patient is supposed to be on continuous oxygen but as per spouse, is not compliant. 5. Hypertension: Reasonably controlled. Continue home medications. 6. Status post permanent pacemaker. Recently seen by EPS cardiologist. 7. Chronic diastolic CHF/chronic chest pain/mild to moderate aortic stenosis: Not on diuretics at home. Compensated. Recent cardiac catheterization-no obstructive coronary disease. Pacemaker recently replaced. 8. Nausea and vomiting: Likely secondary to problem #1. Resolved.  9. Diarrhea: Patient had 2 episodes of diarrhea on 2/7 but none since.  10. 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 He was last seen by his EPS cardiologist on 10/19/13 at which time pacemaker interrogation demonstrated asymptomatic several minutes of atrial high rates of over 200 beats per minute and cardiologist recommended watchful waiting and stated that he may ultimately require anticoagulation but did not have enough evidence of A. fib at that time to recommend anticoagulation. Continue aspirin and metoprolol. Followup outpatient with his cardiologist-has appointment soon on 2/10 at which time anticoagulation can be readdressed.   Consultations:  None  Procedures:  None    Discharge Exam:  Complaints:  Denies complaints. Denies nausea, vomiting, diarrhea, abdominal pain or dysuria. Blood and urine has resolved.  Filed Vitals:   10/27/13 2037 10/27/13 2043 10/28/13 0456 10/28/13 0812  BP: 108/77  121/84 122/83  Pulse: 62 62 63 65  Temp: 97.6 F (36.4 C)  97.3 F (36.3 C)   TempSrc: Oral  Oral   Resp: 20 20 20    Height:      Weight:   89.676 kg (197 lb 11.2 oz)   SpO2: 93% 91% 92%     General exam: Moderately built and nourished middle-aged male patient, sitting up at age of bed, pleasant, smiling and in no obvious distress. Looks much better since previous days.  Respiratory system: Clear. No increased work of breathing.  Cardiovascular system: S1 & S2 heard, irregularly irregular. No JVD, murmurs, gallops, clicks or pedal edema.  Gastrointestinal system: Abdomen is nondistended, soft and nontender. Normal bowel sounds heard.  Central nervous system: Alert and oriented. No focal neurological deficits. Hard of hearing.  Extremities: Symmetric 5 x 5 power.  Discharge Instructions      Discharge Orders   Future Appointments Provider Department Dept Phone   10/30/2013 10:40 AM Arnoldo Lenis, MD [facility] [place] [phone]    01/23/2014 8:05 AM [facility] [place] [facility] [phone]   Future Orders Complete By Expires   (HEART FAILURE PATIENTS) Call MD:  Anytime you have any of the following symptoms: 1) 3 pound weight gain in 24 hours or 5 pounds in 1 week 2) shortness of breath, with or without a dry hacking cough 3) swelling in the hands, feet or stomach 4) if you have to sleep on extra pillows at night in order to breathe.  As directed    Call MD for:  difficulty breathing, headache or visual disturbances  As directed    Call MD for:  extreme fatigue  As directed    Call MD for:  persistant dizziness or light-headedness  As directed    Call MD for:  persistant nausea and vomiting  As directed    Call MD for:  severe uncontrolled pain  As directed    Call MD for:  temperature >100.4  As directed    Diet - low sodium heart healthy  As directed    Diet Carb Modified  As directed    Discharge instructions  As directed    Comments:     1. continue nightly CPAP and continuous nasal oxygen, at previous home settings.   Increase activity slowly  As directed        Medication List         aspirin 81 MG tablet  Take 81 mg by mouth daily.     CENTRUM tablet  Take 1 tablet by mouth daily.     cetirizine 10 MG tablet  Commonly known as:  ZYRTEC  Take 10 mg by mouth daily.  insulin aspart protamine- aspart (70-30) 100 UNIT/ML injection  Commonly known as:  NOVOLOG MIX 70/30  Inject 45-48 Units into the skin 2 (two) times daily. 45 units with breakfast and 48 units with supper.     LEVOXYL 50 MCG tablet  Generic drug:  levothyroxine  Take 50 mcg by mouth daily.     metoprolol tartrate 25 MG tablet  Commonly known as:  LOPRESSOR  Take 1 tablet (25 mg total) by mouth 2 (two) times daily.     NASONEX 50 MCG/ACT nasal spray  Generic drug:  mometasone  Place 2 sprays into the nose daily as needed (for congestion).     nitroGLYCERIN 0.4 MG SL tablet  Commonly known as:   NITROSTAT  Place 0.4 mg under the tongue every 5 (five) minutes as needed for chest pain.     NOVOLOG 100 UNIT/ML injection  Generic drug:  insulin aspart  Inject 5 Units into the skin 3 (three) times daily.     omeprazole 40 MG capsule  Commonly known as:  PRILOSEC  Take 1 capsule (40 mg total) by mouth daily.     OSTEO BI-FLEX TRIPLE STRENGTH PO  Take 1 tablet by mouth daily.     sodium chloride 0.65 % Soln nasal spray  Commonly known as:  OCEAN  Place 1 spray into both nostrils as needed (nose bleeds).     sulfamethoxazole-trimethoprim 800-160 MG per tablet  Commonly known as:  BACTRIM DS  Take 1 tablet by mouth every 12 (twelve) hours.     VENTOLIN HFA 108 (90 BASE) MCG/ACT inhaler  Generic drug:  albuterol  Inhale 2 puffs into the lungs every 6 (six) hours as needed for wheezing.       Follow-up Information   Follow up with [facility].   Contact information:   [address] [place] [address] [phone]       Follow up with Petra Kuba, MD On 10/31/2013. (Keep up previous appointment. To be seen with repeat labs (CBC & BMP). Recommend outpatient urology consultation.)    Specialty:  Family Medicine   Contact information:   [place] [address] [phone]       Follow up with Arnoldo Lenis, MD On 10/30/2013. (At 10:40 AM)    Specialty:  Cardiology   Contact information:   [address] [place] [address] [phone]        The results of significant diagnostics from this hospitalization (including imaging, microbiology, ancillary and laboratory) are listed below for reference.    Significant Diagnostic Studies: Dg Abd 1 View  10/25/2013   CLINICAL DATA:  53 year old male with abdominal pain and hematuria. Initial encounter.  EXAM: ABDOMEN - 1 VIEW  COMPARISON:  CT Abdomen and Pelvis 12/19/2007.  FINDINGS: Two supine views. Non obstructed bowel gas pattern. Cardiac pacemaker  leads partially visible. No definite pneumoperitoneum. No acute osseous abnormality identified. Calcified flank injection granulomas. No definite urologic calculus. No acute osseous abnormality identified.  IMPRESSION: Non obstructed bowel gas pattern.   Electronically Signed   By: Lars Pinks M.D.   On: 10/25/2013 19:06   US Renal  10/26/2013   CLINICAL DATA:  Hematuria.  EXAM: RENAL/URINARY TRACT ULTRASOUND COMPLETE  COMPARISON:  CT abdomen and pelvis 12/19/2007  FINDINGS: Right Kidney:  Length: 10.4 cm. As demonstrated on prior CT, areas of scarring are again seen. There is a 5 mm linear echogenic focus in the interpolar kidney without  shadowing. No definite calculi are identified. There is no hydronephrosis.  Left Kidney:  Length: 12.0 cm. Echogenicity within normal limits. No mass or hydronephrosis visualized.  Bladder:  Appears normal for degree of bladder distention. A right ureteral jet was identified. A left ureteral jet was not seen during the examination.  IMPRESSION: No hydronephrosis or definite renal calculi.  Right renal scarring.   Electronically Signed   By: Logan Bores   On: 10/26/2013 09:56    Microbiology: Recent Results (from the past 240 hour(s))  URINE CULTURE     Status: None   Collection Time    10/25/13  2:45 PM      Result Value Range Status   Specimen Description URINE, CLEAN CATCH   Final   Special Requests NONE   Final   Culture  Setup Time     Final   Value: 10/25/2013 17:58     Performed at [facility]     Final   Value: >=100,000 COLONIES/ML     Performed at [facility]   Culture     Final   Value: ESCHERICHIA COLI     Performed at [facility]   Report Status 10/27/2013 FINAL   Final   Organism ID, Bacteria ESCHERICHIA COLI   Final     Labs: Basic Metabolic Panel:  Recent Labs Lab 10/25/13 1609 10/26/13 0522  NA 136* 137  K 4.6 3.9  CL 94* 98  CO2 29 27  GLUCOSE 225* 269*  BUN 14 14  CREATININE 0.84 0.87   CALCIUM 9.8 8.6   Liver Function Tests: No results found for this basename: AST, ALT, ALKPHOS, BILITOT, PROT, ALBUMIN,  in the last 168 hours No results found for this basename: LIPASE, AMYLASE,  in the last 168 hours No results found for this basename: AMMONIA,  in the last 168 hours CBC:  Recent Labs Lab 10/25/13 1609 10/26/13 0522  WBC 16.5* 11.1*  HGB 14.2 12.9*  HCT 43.4 39.7  MCV 89.3 91.5  PLT 220 198   Cardiac Enzymes: No results found for this basename: CKTOTAL, CKMB, CKMBINDEX, TROPONINI,  in the last 168 hours BNP: BNP (last 3 results) No results found for this basename: PROBNP,  in the last 8760 hours CBG:  Recent Labs Lab 10/27/13 1146 10/27/13 1647 10/27/13 2110 10/28/13 0746 10/28/13 1214  GLUCAP 153* 170* 201* 128* 142*    Signed:  Vernell Leep, MD, FACP, FHM. [facility] Pager [phone]  If 7PM-7AM, please contact night-coverage [email or web address] Password [email or web address] 10/28/2013, 1:11 PM

## 2013-10-28 NOTE — Progress Notes (Signed)
[place] and patient already setup.

## 2013-10-28 NOTE — Progress Notes (Signed)
Patient being d/c home with prescriptions. Verbalizes understanding of d/c instructions and appointments. IV cath removed and intact. No pain/swelling at site.

## 2013-10-29 ENCOUNTER — Telehealth: Payer: Self-pay

## 2013-10-29 NOTE — Telephone Encounter (Signed)
Left message for patient to contact our office.  Was calling to confirm appointment.

## 2013-10-30 ENCOUNTER — Encounter: Payer: Self-pay | Admitting: Cardiology

## 2013-10-30 ENCOUNTER — Ambulatory Visit (INDEPENDENT_AMBULATORY_CARE_PROVIDER_SITE_OTHER): Payer: Medicare Other | Admitting: Cardiology

## 2013-10-30 VITALS — BP 115/80 | HR 73 | Ht 63.0 in | Wt 193.0 lb

## 2013-10-30 DIAGNOSIS — I4892 Unspecified atrial flutter: Secondary | ICD-10-CM

## 2013-10-30 MED ORDER — APIXABAN 5 MG PO TABS: 5.0000 mg | ORAL_TABLET | Freq: Two times a day (BID) | ORAL | Status: DC

## 2013-10-30 NOTE — Patient Instructions (Addendum)
Your physician recommends that you schedule a follow-up appointment in: 3 months  Your physician has recommended you make the following change in your medication:  1) STOP TAKING ASPIRIN  2) START TAKING ELIQUIS 5MG  TWICE DAILY

## 2013-10-30 NOTE — Progress Notes (Signed)
Clinical Summary Mr. Flatt is a 53 y.o.male with multiple medical problems, this is a focused post-hospital visit for his new diagnosis of atrial flutter.   1. Atrial flutter - recent admission for UTI, EKGs show aflutter with variable AV block, new finding for this patient - occasional palpitation, approx once a month. Lasts 1 hour or so.  - DOE is stable, no change since last visit.    Past Medical History  Diagnosis Date  . Chest pain 2007, 2009    cardiac cath > normal coronary arterie; nl left ventricular function  . Cardiac conduction disorder     status post pacemaker implantation in 1995 generator replaced 2005  . Aortic stenosis     mild  . Mitral regurgitation     insignificant  . HTN (hypertension)   . Hyperlipidemia     statin discontinued due to abn LFTs  . Syncope   . Obesity   . Pelvic mass     identified in 2009, stable 2010  . 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 Diabetes mellitus     +Insulin  . Benign hypertrophy of prostate     with urinary retention; repaired hypospadia; transurethral resection of the prostate   . GERD (gastroesophageal reflux disease)     status post multiple dilatations for stricture  . Congenital deafness   . Sleep apnea     BiPAP and continuous oxygen  . Obesity 4/08    BMI= 40  . Anemia   . Alcohol use      Allergies  Allergen Reactions  . Ciprofloxacin     Itching and red rash up arm when infusing after 3rd cipro dose for uti on 10/27/13  . Enalapril Other (See Comments)    Cough  . Metformin And Related     Severe diaarhea  . Penicillins Other (See Comments)    Reaction unspecified     Current Outpatient Prescriptions  Medication Sig Dispense Refill  . albuterol (VENTOLIN HFA) 108 (90 BASE) MCG/ACT inhaler Inhale 2 puffs into the lungs every 6 (six) hours as needed for wheezing.       Marland Kitchen aspirin 81 MG tablet Take 81 mg by mouth daily.         . cetirizine (ZYRTEC) 10 MG tablet Take 10 mg by mouth daily.      . insulin aspart protamine- aspart (NOVOLOG MIX 70/30) (70-30) 100 UNIT/ML injection Inject 45-48 Units into the skin 2 (two) times daily. 45 units with breakfast and 48 units with supper.      . levothyroxine (LEVOXYL) 50 MCG tablet Take 50 mcg by mouth daily.        . metoprolol tartrate (LOPRESSOR) 25 MG tablet Take 1 tablet (25 mg total) by mouth 2 (two) times daily.  60 tablet  11  . Misc Natural Products (OSTEO BI-FLEX TRIPLE STRENGTH PO) Take 1 tablet by mouth daily.      . mometasone (NASONEX) 50 MCG/ACT nasal spray Place 2 sprays into the nose daily as needed (for congestion).       . Multiple Vitamins-Minerals (CENTRUM) tablet Take 1 tablet by mouth daily.        . nitroGLYCERIN (NITROSTAT) 0.4 MG SL tablet Place 0.4 mg under the tongue every 5 (five) minutes as needed for chest pain.      Marland Kitchen NOVOLOG 100 UNIT/ML injection Inject 5 Units into the skin 3 (three) times daily.       Marland Kitchen  omeprazole (PRILOSEC) 40 MG capsule Take 1 capsule (40 mg total) by mouth daily.  30 capsule  11  . sodium chloride (OCEAN) 0.65 % SOLN nasal spray Place 1 spray into both nostrils as needed (nose bleeds).       Marland Kitchen sulfamethoxazole-trimethoprim (BACTRIM DS) 800-160 MG per tablet Take 1 tablet by mouth every 12 (twelve) hours.  8 tablet  0   No current facility-administered medications for this visit.     Past Surgical History  Procedure Laterality Date  . Orchiectomy  1990    bilateral; ?neoplasm  . Repair hypospadias w/ urethroplasty    . Transurethral resection of prostate      for BPH  . Tonsillectomy and adenoidectomy    . A-v cardiac pacemaker insertion  1995  . Colonoscopy  2008  . Pacemaker insertion  05/2004; 10/04/2013    MDT dual chamber pacemaker; gen change 10/04/2013 (MDT ADDRL1)     Allergies  Allergen Reactions  . Ciprofloxacin     Itching and red rash up arm when infusing after 3rd cipro dose for uti on 10/27/13  .  Enalapril Other (See Comments)    Cough  . Metformin And Related     Severe diaarhea  . Penicillins Other (See Comments)    Reaction unspecified      Family History  Problem Relation Age of Onset  . COPD Mother   . Hypertension Mother   . Pneumonia Father   . Hypertension Other   . Heart disease Other      Social History Mr. Wallen reports that he has never smoked. He has never used smokeless tobacco. Mr. Matton reports that he does not drink alcohol.   Review of Systems CONSTITUTIONAL: No weight loss, fever, chills, weakness or fatigue.  HEENT: Eyes: No visual loss, blurred vision, double vision or yellow sclerae.No hearing loss, sneezing, congestion, runny nose or sore throat.  SKIN: No rash or itching.  CARDIOVASCULAR: per HPI RESPIRATORY: SOB GASTROINTESTINAL: No anorexia, nausea, vomiting or diarrhea. No abdominal pain or blood.  GENITOURINARY: No burning on urination, no polyuria NEUROLOGICAL: No headache, dizziness, syncope, paralysis, ataxia, numbness or tingling in the extremities. No change in bowel or bladder control.  MUSCULOSKELETAL: No muscle, back pain, joint pain or stiffness.  LYMPHATICS: No enlarged nodes. No history of splenectomy.  PSYCHIATRIC: No history of depression or anxiety.  ENDOCRINOLOGIC: No reports of sweating, cold or heat intolerance. No polyuria or polydipsia.  Marland Kitchen   Physical Examination p 73 bp 115/80 Wt 193 lbs BMI 34 Gen: resting comfortably, no acute distress HEENT: no scleral icterus, pupils equal round and reactive, no palptable cervical adenopathy,  CV: irreg, 2/6 systolic murmur RUSB, no JVD Resp: Clear to auscultation bilaterally GI: abdomen is soft, non-tender, non-distended, normal bowel sounds, no hepatosplenomegaly MSK: extremities are warm, no edema.  Skin: warm, no rash Neuro:  no focal deficits Psych: appropriate affect   Diagnostic Studies 02/2013 Echo limited evaluate AS: mild AS (mean grad 16   01/2013  Echo: difficult study, LV function mildly reduced ( no percentage given), possible moderate to severe AS (AVA .53) mean grad 27 mmHg.   07/30/13 Clinic EKG: sinus rhythm, 1st degree AV block, RBBB  Jan 2015 Echo LVEF 55-60%, cannot eval diastolic function, mod AS,    Assessment and Plan  1. Atrial flutter - new diagnosis, noted on EKG during recent admission for UTI - rates normal today, denies any significant palpitations - continue metoprolol - CHADS2 Vasc score is 3, consistent with  3.2% risk of stroke.  GFR >90.  - discussed risk and benefits of anticoagulation - will start eliquis 5mg  bid for stroke prophylaxis, stop ASA  Follow up 3 months      Arnoldo Lenis, M.D., F.A.C.C.

## 2013-12-06 ENCOUNTER — Telehealth: Payer: Self-pay | Admitting: *Deleted

## 2013-12-06 NOTE — Telephone Encounter (Signed)
Received fax from [facility]. Needs prescription filled out and sent back to them. Form in Dr Nelly Laurence folder to fill out.

## 2013-12-24 ENCOUNTER — Telehealth: Payer: Self-pay | Admitting: Cardiology

## 2013-12-24 NOTE — Telephone Encounter (Signed)
See note below, can patient stop Eliquis for TURP and how soon should he stop it if allowed

## 2013-12-24 NOTE — Telephone Encounter (Signed)
Needs okay to stop Eliquis prior to surgery scheduled for April 20th.  Patient is to have possible TURP. / tgs

## 2013-12-25 ENCOUNTER — Telehealth: Payer: Self-pay | Admitting: Cardiology

## 2013-12-25 NOTE — Telephone Encounter (Signed)
It is ok to stop eliquis before the surgery. I would stop 5 days prior to surgery. He will need to be bridged with lovenox 90 mg subcutaneous twice a day for those 5 days before the surgery, with the last dose the night before the surgery. He does not need to be bridged after surgery, start eliquis usually 1 day after surgery but that is up to the surgeon. Please arrange prescription for lovenox if he is undergoing surgery   Carlyle Dolly MD

## 2013-12-25 NOTE — Telephone Encounter (Signed)
Faxed update instructions from Dr.Branch to [facility] with Dr.Robert Mitzi Hansen @ 1700 hrs  I left message for wife with whom I spoke with earlier updating her that there is no need for lovenox bridge and also to hold Eliquis 48 hrs before procedure.She may call back to discuss instructions

## 2013-12-25 NOTE — Telephone Encounter (Signed)
Spoke with Dr Harl Bowie.  No need to bridge pt with lovenox since pt is on Eliquis.  Recommendation is to hold Eliquis 24 hrs prior to invasive procedure/surgey with low risk of bleeding and 48hrs prior to invasive procedure/surgery with moderate/high risk of bleeding.  Have pt hold Eliquis 48 hrs prior to TURP and restart after surgery as soon as adequate hemostasis has been established.

## 2013-12-25 NOTE — Telephone Encounter (Signed)
Orlean Patten with Dr.Robert Andrew,received fax number [phone] will fax message from [place]. i have forwarded lovenox instructions to our coumadin nurse Edrick Oh RN

## 2013-12-25 NOTE — Telephone Encounter (Signed)
Please call patient's wife regarding patient assistance program for Eliquis / tgs

## 2013-12-25 NOTE — Telephone Encounter (Signed)
Refaxed rx to [employer] allowing pt to have 90 day supply of Eliquis Will check on 12/27/13 for authorization   Relayed this to wife,have left samples for her at our front desk

## 2014-01-01 MED ORDER — APIXABAN 5 MG PO TABS
5.0000 mg | ORAL_TABLET | Freq: Two times a day (BID) | ORAL | Status: DC
Start: 2014-01-01 — End: 2014-02-21

## 2014-01-01 NOTE — Telephone Encounter (Signed)
Pt approved for 45 dollars for 30 day supply and 120 dollars for 3 month supply for mail order.

## 2014-01-01 NOTE — Addendum Note (Signed)
Addended by: Truett Mainland on: 01/01/2014 05:34 PM   Modules accepted: Orders

## 2014-01-22 ENCOUNTER — Encounter: Payer: Self-pay | Admitting: Internal Medicine

## 2014-01-22 ENCOUNTER — Telehealth: Payer: Self-pay

## 2014-01-22 DIAGNOSIS — I498 Other specified cardiac arrhythmias: Secondary | ICD-10-CM

## 2014-01-22 LAB — MDC_IDC_ENUM_SESS_TYPE_REMOTE
Battery Impedance: 100 Ohm
Battery Voltage: 2.8 V
Brady Statistic AP VP Percent: 0 %
Brady Statistic AP VS Percent: 0 %
Brady Statistic AS VP Percent: 0 %
Lead Channel Impedance Value: 300 Ohm
Lead Channel Pacing Threshold Amplitude: 0.5 V
Lead Channel Sensing Intrinsic Amplitude: 2 mV
Lead Channel Setting Pacing Amplitude: 2 V
Lead Channel Setting Pacing Amplitude: 2.5 V
Lead Channel Setting Pacing Pulse Width: 0.4 ms
MDC IDC MSMT BATTERY REMAINING LONGEVITY: 168 mo
MDC IDC MSMT LEADCHNL RV IMPEDANCE VALUE: 437 Ohm
MDC IDC MSMT LEADCHNL RV PACING THRESHOLD PULSEWIDTH: 0.4 ms
MDC IDC MSMT LEADCHNL RV SENSING INTR AMPL: 8 mV
MDC IDC SESS DTM: 20150505234808
MDC IDC SET LEADCHNL RV SENSING SENSITIVITY: 2.8 mV
MDC IDC STAT BRADY AS VS PERCENT: 100 %

## 2014-01-22 NOTE — Telephone Encounter (Signed)
Received fax request from [facility] form Dr.[place] oral surgeon [phone] for medical clearance for pt to have teeth extracted.I have copied most recent labs and last ov note.I have placed request in Dr.Branch's folder on his desk to complete

## 2014-01-23 ENCOUNTER — Ambulatory Visit (INDEPENDENT_AMBULATORY_CARE_PROVIDER_SITE_OTHER): Payer: Medicare Other | Admitting: *Deleted

## 2014-01-23 DIAGNOSIS — I498 Other specified cardiac arrhythmias: Secondary | ICD-10-CM

## 2014-01-30 ENCOUNTER — Telehealth: Payer: Self-pay | Admitting: Cardiology

## 2014-01-30 NOTE — Telephone Encounter (Signed)
Received request for cardiac clearance for upcoming oral surgery. There is no contraindication from a cardiac standpoint for his surgery. Recommend holding eliquis 3 days prior, and resuming 1 day after surgery.   Carlyle Dolly MD

## 2014-01-31 ENCOUNTER — Encounter: Payer: Self-pay | Admitting: Cardiology

## 2014-02-01 NOTE — Progress Notes (Signed)
Remote pacemaker transmission.   

## 2014-02-04 ENCOUNTER — Ambulatory Visit: Payer: Medicare Other | Admitting: Cardiology

## 2014-02-19 ENCOUNTER — Telehealth: Payer: Self-pay | Admitting: *Deleted

## 2014-02-19 NOTE — Telephone Encounter (Signed)
PT wife is calling because every time he takes his eliquis he has a sever nose bleed. Pt was told by PCP to stop taking medication until he follows up with cardiologist.

## 2014-02-19 NOTE — Telephone Encounter (Signed)
See message below, please advise.

## 2014-02-20 NOTE — Telephone Encounter (Signed)
Left message to call back  

## 2014-02-20 NOTE — Telephone Encounter (Signed)
I reviewed his labs. Appears to have normal renal function. Can try edoxaban 60 mg daily.

## 2014-02-20 NOTE — Telephone Encounter (Signed)
Please advise due to MD absence

## 2014-02-21 ENCOUNTER — Telehealth: Payer: Self-pay | Admitting: Cardiology

## 2014-02-21 MED ORDER — EDOXABAN TOSYLATE 60 MG PO TABS: 60.0000 mg | ORAL_TABLET | Freq: Every day | ORAL | Status: DC

## 2014-02-21 NOTE — Telephone Encounter (Signed)
Noted. Patient is to report recurrence of bleeding.

## 2014-02-21 NOTE — Telephone Encounter (Signed)
Spoke with pt he agreed to take Savaysa 60 mg daily. Left pt samples and a savings card to pick up at front desk.

## 2014-02-21 NOTE — Telephone Encounter (Signed)
Please see refill bin / tgs  °

## 2014-02-21 NOTE — Telephone Encounter (Signed)
Faxed prior authorization for [facility]

## 2014-02-25 ENCOUNTER — Telehealth: Payer: Self-pay | Admitting: *Deleted

## 2014-02-25 NOTE — Telephone Encounter (Signed)
Spoke to patients wife and she stated the 30 day free trial was not valid at pharmacy because they had medicare. Called and spoke to pharmacist said the 30 day free trial is not valid for medicare part D or government funded programs(on the back of the card). Requested pre authorization, and faxed to [facility] for the [facility].

## 2014-02-25 NOTE — Telephone Encounter (Signed)
PT WIFE IS CALLING BECAUSE THEY GOT A DENIAL LETTER FROM INSURANCE THAT THEY WERE NOT GOING TO COVER BLOOD THINNER.

## 2014-03-18 ENCOUNTER — Ambulatory Visit (INDEPENDENT_AMBULATORY_CARE_PROVIDER_SITE_OTHER): Payer: Medicare Other | Admitting: Cardiology

## 2014-03-18 ENCOUNTER — Encounter: Payer: Self-pay | Admitting: Cardiology

## 2014-03-18 VITALS — BP 100/70 | HR 81 | Ht 63.0 in | Wt 194.0 lb

## 2014-03-18 DIAGNOSIS — I4891 Unspecified atrial fibrillation: Secondary | ICD-10-CM

## 2014-03-18 DIAGNOSIS — R0989 Other specified symptoms and signs involving the circulatory and respiratory systems: Secondary | ICD-10-CM

## 2014-03-18 DIAGNOSIS — I10 Essential (primary) hypertension: Secondary | ICD-10-CM

## 2014-03-18 NOTE — Progress Notes (Signed)
Clinical Summary [name] is a 53 y.o.male seen today for follow up of the following medical problems.  1. Atrial flutter   - denies any palpitations - DOE is stable, no change since last visit.  - had severe nose bleeds on eliquis, changed to savaysa. Nose bleeds have resolved resolved.   2. Moderate aortic stenosis - denies any chest pain, SOB, lightheadedness, or syncope  3. Tachy-Brady syndrme - last check 01/2014 with normal function - denies any lightheadess or dizziness  4. HTN - compliant with meds - checks blood pressures regularly, typically 120s/70s  5. DM - followed by pcp - last Hgb A1c 10     Past Medical History  Diagnosis Date  . Chest pain 2007, 2009    cardiac cath > normal coronary arterie; nl left ventricular function  . Cardiac conduction disorder     status post pacemaker implantation in 1995 generator replaced 2005  . Aortic stenosis     mild  . Mitral regurgitation     insignificant  . HTN (hypertension)   . Hyperlipidemia     statin discontinued due to abn LFTs  . Syncope   . Obesity   . Pelvic mass     identified in 2009, stable 2010  . 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 Diabetes mellitus     +Insulin  . Benign hypertrophy of prostate     with urinary retention; repaired hypospadia; transurethral resection of the prostate   . GERD (gastroesophageal reflux disease)     status post multiple dilatations for stricture  . Congenital deafness   . Sleep apnea     BiPAP and continuous oxygen  . Obesity 4/08    BMI= 40  . Anemia   . Alcohol use      Allergies  Allergen Reactions  . Ciprofloxacin     Itching and red rash up arm when infusing after 3rd cipro dose for uti on 10/27/13  . Enalapril Other (See Comments)    Cough  . Metformin And Related     Severe diaarhea  . Penicillins Other (See Comments)    Reaction unspecified     Current Outpatient  Prescriptions  Medication Sig Dispense Refill  . albuterol (VENTOLIN HFA) 108 (90 BASE) MCG/ACT inhaler Inhale 2 puffs into the lungs every 6 (six) hours as needed for wheezing.       Marland Kitchen aspirin 81 MG tablet Take 81 mg by mouth daily.        . cetirizine (ZYRTEC) 10 MG tablet Take 10 mg by mouth daily.      . Edoxaban Tosylate (SAVAYSA) 60 MG TABS Take 60 mg by mouth daily.  30 tablet  6  . insulin aspart protamine- aspart (NOVOLOG MIX 70/30) (70-30) 100 UNIT/ML injection Inject 45-48 Units into the skin 2 (two) times daily. 45 units with breakfast and 48 units with supper.      . levothyroxine (LEVOXYL) 50 MCG tablet Take 50 mcg by mouth daily.        . metoprolol tartrate (LOPRESSOR) 25 MG tablet Take 1 tablet (25 mg total) by mouth 2 (two) times daily.  60 tablet  11  . Misc Natural Products (OSTEO BI-FLEX TRIPLE STRENGTH PO) Take 1 tablet by mouth daily.      . mometasone (NASONEX) 50 MCG/ACT nasal spray Place 2 sprays into the nose daily as needed (for congestion).       Marland Kitchen  Multiple Vitamins-Minerals (CENTRUM) tablet Take 1 tablet by mouth daily.        . nitroGLYCERIN (NITROSTAT) 0.4 MG SL tablet Place 0.4 mg under the tongue every 5 (five) minutes as needed for chest pain.      Marland Kitchen NOVOLOG 100 UNIT/ML injection Inject 5 Units into the skin 3 (three) times daily.       Marland Kitchen omeprazole (PRILOSEC) 40 MG capsule Take 1 capsule (40 mg total) by mouth daily.  30 capsule  11  . Simethicone (GAS RELIEF 80 PO) Take 1 capsule by mouth daily.      . sodium chloride (OCEAN) 0.65 % SOLN nasal spray Place 1 spray into both nostrils as needed (nose bleeds).       Marland Kitchen sulfamethoxazole-trimethoprim (BACTRIM DS) 800-160 MG per tablet Take 1 tablet by mouth every 12 (twelve) hours.  8 tablet  0   No current facility-administered medications for this visit.     Past Surgical History  Procedure Laterality Date  . Orchiectomy  1990    bilateral; ?neoplasm  . Repair hypospadias w/ urethroplasty    . Transurethral  resection of prostate      for BPH  . Tonsillectomy and adenoidectomy    . A-v cardiac pacemaker insertion  1995  . Colonoscopy  2008  . Pacemaker insertion  05/2004; 10/04/2013    MDT dual chamber pacemaker; gen change 10/04/2013 (MDT ADDRL1)     Allergies  Allergen Reactions  . Ciprofloxacin     Itching and red rash up arm when infusing after 3rd cipro dose for uti on 10/27/13  . Enalapril Other (See Comments)    Cough  . Metformin And Related     Severe diaarhea  . Penicillins Other (See Comments)    Reaction unspecified      Family History  Problem Relation Age of Onset  . COPD Mother   . Hypertension Mother   . Pneumonia Father   . Hypertension Other   . Heart disease Other      Social History [name] reports that he has never smoked. He has never used smokeless tobacco. [name] reports that he does not drink alcohol.   Review of Systems CONSTITUTIONAL: No weight loss, fever, chills, weakness or fatigue.  HEENT: Eyes: No visual loss, blurred vision, double vision or yellow sclerae.No hearing loss, sneezing, congestion, runny nose or sore throat.  SKIN: No rash or itching.  CARDIOVASCULAR: per HPI RESPIRATORY: No shortness of breath, cough or sputum.  GASTROINTESTINAL: No anorexia, nausea, vomiting or diarrhea. No abdominal pain or blood.  GENITOURINARY: No burning on urination, no polyuria NEUROLOGICAL: No headache, dizziness, syncope, paralysis, ataxia, numbness or tingling in the extremities. No change in bowel or bladder control.  MUSCULOSKELETAL: No muscle, back pain, joint pain or stiffness.  LYMPHATICS: No enlarged nodes. No history of splenectomy.  PSYCHIATRIC: No history of depression or anxiety.  ENDOCRINOLOGIC: No reports of sweating, cold or heat intolerance. No polyuria or polydipsia.  Marland Kitchen   Physical Examination p 81 bp 100/70 Wt 194 lbs BMI 34 Gen: resting comfortably, no acute distress HEENT: no scleral icterus, pupils equal round and  reactive, no palptable cervical adenopathy,  CV: RRR, 3/6 early to mid peaking systolic murmur RUSB, bilateral carotid bruits Resp: Clear to auscultation bilaterally GI: abdomen is soft, non-tender, non-distended, normal bowel sounds, no hepatosplenomegaly MSK: extremities are warm, no edema.  Skin: warm, no rash Neuro:  no focal deficits Psych: appropriate affect   Diagnostic Studies 02/2013 Echo limited evaluate  AS: mild AS (mean grad 16   01/2013 Echo: difficult study, LV function mildly reduced ( no percentage given), possible moderate to severe AS (AVA .53) mean grad 27 mmHg.   07/30/13 Clinic EKG: sinus rhythm, 1st degree AV block, RBBB   Jan 2015 Echo  LVEF 55-60%, cannot eval diastolic function, mod AS (area 1, mean grad 20).       Assessment and Plan  1. Atrial flutter  - rates normal today, denies any significant palpitations  - continue metoprolol  - CHADS2 Vasc score is 3, consistent with 3.2% risk of stroke. Heavy nose bleeds on eliquis, changed to savaysa with resolution. Continue current therapy  2. Aortic stenosis - moderate by most recent echo Jan 2015, no current symptoms - repeat echo Jan 2016  3. Carotid bruits - check carotid US  4. Tachy-Brady syndrome - no current symptoms - normal pacemaker function on last check 01/2014, continue regular device appointments  5. DM - management per pcpt    F/u 6 months    Arnoldo Lenis, M.D., F.A.C.C.

## 2014-03-18 NOTE — Patient Instructions (Signed)
Your physician wants you to follow-up in: 6 months You will receive a reminder letter in the mail two months in advance. If you don't receive a letter, please call our office to schedule the follow-up appointment.     Your physician has requested that you have a carotid duplex. This test is an ultrasound of the carotid arteries in your neck. It looks at blood flow through these arteries that supply the brain with blood. Allow one hour for this exam. There are no restrictions or special instructions.    Your physician recommends that you continue on your current medications as directed. Please refer to the Current Medication list given to you today.     Thank you for choosing  [facility] !        

## 2014-03-19 ENCOUNTER — Ambulatory Visit (HOSPITAL_COMMUNITY)
Admission: RE | Admit: 2014-03-19 | Discharge: 2014-03-19 | Disposition: A | Discharge status: Home / Self Care | Payer: Medicare Other | Source: Ambulatory Visit | Attending: Cardiology | Admitting: Cardiology

## 2014-03-19 DIAGNOSIS — R0989 Other specified symptoms and signs involving the circulatory and respiratory systems: Secondary | ICD-10-CM

## 2014-03-20 ENCOUNTER — Encounter: Payer: Self-pay | Admitting: Cardiology

## 2014-04-16 ENCOUNTER — Encounter (HOSPITAL_COMMUNITY): Payer: Self-pay | Admitting: Emergency Medicine

## 2014-04-16 ENCOUNTER — Emergency Department (HOSPITAL_COMMUNITY)
Admission: EM | Admit: 2014-04-16 | Discharge: 2014-04-16 | Disposition: A | Discharge status: Home / Self Care | Payer: Medicare Other | Attending: Emergency Medicine | Admitting: Emergency Medicine

## 2014-04-16 DIAGNOSIS — E669 Obesity, unspecified: Secondary | ICD-10-CM | POA: Diagnosis not present

## 2014-04-16 DIAGNOSIS — Z880 Allergy status to penicillin: Secondary | ICD-10-CM | POA: Insufficient documentation

## 2014-04-16 DIAGNOSIS — Z862 Personal history of diseases of the blood and blood-forming organs and certain disorders involving the immune mechanism: Secondary | ICD-10-CM | POA: Insufficient documentation

## 2014-04-16 DIAGNOSIS — I359 Nonrheumatic aortic valve disorder, unspecified: Secondary | ICD-10-CM | POA: Diagnosis not present

## 2014-04-16 DIAGNOSIS — I059 Rheumatic mitral valve disease, unspecified: Secondary | ICD-10-CM | POA: Diagnosis not present

## 2014-04-16 DIAGNOSIS — IMO0002 Reserved for concepts with insufficient information to code with codable children: Secondary | ICD-10-CM | POA: Diagnosis not present

## 2014-04-16 DIAGNOSIS — I459 Conduction disorder, unspecified: Secondary | ICD-10-CM | POA: Insufficient documentation

## 2014-04-16 DIAGNOSIS — I10 Essential (primary) hypertension: Secondary | ICD-10-CM | POA: Insufficient documentation

## 2014-04-16 DIAGNOSIS — Z79899 Other long term (current) drug therapy: Secondary | ICD-10-CM | POA: Insufficient documentation

## 2014-04-16 DIAGNOSIS — E119 Type 2 diabetes mellitus without complications: Secondary | ICD-10-CM | POA: Insufficient documentation

## 2014-04-16 DIAGNOSIS — G4730 Sleep apnea, unspecified: Secondary | ICD-10-CM | POA: Diagnosis not present

## 2014-04-16 DIAGNOSIS — R040 Epistaxis: Secondary | ICD-10-CM | POA: Insufficient documentation

## 2014-04-16 DIAGNOSIS — Z87448 Personal history of other diseases of urinary system: Secondary | ICD-10-CM | POA: Diagnosis not present

## 2014-04-16 DIAGNOSIS — Z9981 Dependence on supplemental oxygen: Secondary | ICD-10-CM | POA: Insufficient documentation

## 2014-04-16 DIAGNOSIS — Z794 Long term (current) use of insulin: Secondary | ICD-10-CM | POA: Diagnosis not present

## 2014-04-16 DIAGNOSIS — N400 Enlarged prostate without lower urinary tract symptoms: Secondary | ICD-10-CM | POA: Insufficient documentation

## 2014-04-16 DIAGNOSIS — K219 Gastro-esophageal reflux disease without esophagitis: Secondary | ICD-10-CM | POA: Insufficient documentation

## 2014-04-16 LAB — COMPREHENSIVE METABOLIC PANEL
ALT: 50 U/L (ref 0–53)
AST: 42 U/L — ABNORMAL HIGH (ref 0–37)
Albumin: 3.2 g/dL — ABNORMAL LOW (ref 3.5–5.2)
Alkaline Phosphatase: 273 U/L — ABNORMAL HIGH (ref 39–117)
Anion gap: 8 (ref 5–15)
BUN: 19 mg/dL (ref 6–23)
CALCIUM: 9 mg/dL (ref 8.4–10.5)
CO2: 31 meq/L (ref 19–32)
CREATININE: 0.84 mg/dL (ref 0.50–1.35)
Chloride: 99 mEq/L (ref 96–112)
GFR calc Af Amer: >90 mL/min (ref >90)
GLUCOSE: 297 mg/dL — AB (ref 70–99)
Potassium: 4.7 mEq/L (ref 3.7–5.3)
Sodium: 138 mEq/L (ref 137–147)
Total Bilirubin: 1.3 mg/dL — ABNORMAL HIGH (ref 0.3–1.2)
Total Protein: 6.4 g/dL (ref 6.0–8.3)

## 2014-04-16 LAB — CBC WITH DIFFERENTIAL/PLATELET
BASOS ABS: 0.1 10*3/uL (ref 0.0–0.1)
BASOS PCT: 1 % (ref 0–1)
EOS ABS: 0.2 10*3/uL (ref 0.0–0.7)
Eosinophils Relative: 1 % (ref 0–5)
HCT: 40 % (ref 39.0–52.0)
HEMOGLOBIN: 12.9 g/dL — AB (ref 13.0–17.0)
Lymphocytes Relative: 14 % (ref 12–46)
Lymphs Abs: 1.9 10*3/uL (ref 0.7–4.0)
MCH: 28.2 pg (ref 26.0–34.0)
MCHC: 32.3 g/dL (ref 30.0–36.0)
MCV: 87.3 fL (ref 78.0–100.0)
Monocytes Absolute: 1.3 10*3/uL — ABNORMAL HIGH (ref 0.1–1.0)
Monocytes Relative: 9 % (ref 3–12)
NEUTROS ABS: 10.6 10*3/uL — AB (ref 1.7–7.7)
NEUTROS PCT: 75 % (ref 43–77)
Platelets: 220 10*3/uL (ref 150–400)
RBC: 4.58 MIL/uL (ref 4.22–5.81)
RDW: 16.5 % — AB (ref 11.5–15.5)
WBC: 14 10*3/uL — ABNORMAL HIGH (ref 4.0–10.5)

## 2014-04-16 MED ORDER — SALINE SPRAY 0.65 % NA SOLN
1.0000 | NASAL | Status: DC | PRN
  Administered 2014-04-16: 1 via NASAL
  Filled 2014-04-16: qty 44

## 2014-04-16 MED ORDER — SILVER NITRATE-POT NITRATE 75-25 % EX MISC
CUTANEOUS | Status: AC
  Filled 2014-04-16: qty 1

## 2014-04-16 NOTE — ED Notes (Addendum)
PT nose started bleeding in the shower this am. Per EMS pt administered 2 sprays of Afrin in route to facility. On arrival left nare is bleeding at this time. Pt denies any injury. PT on Savaysa blood thinner.

## 2014-04-16 NOTE — Discharge Instructions (Signed)
As discussed, please monitor your condition carefully, and minimize activities that may disrupt the blood clot about your nose bleed.   Nosebleed A nosebleed can be caused by many things, including:  Getting hit hard in the nose.  Infections.  Dry nose.  Colds.  Medicines. Your doctor may do lab testing if you get nosebleeds a lot and the cause is not known. HOME CARE   If your nose was packed with material, keep it there until your doctor takes it out. Put the pack back in your nose if the pack falls out.  Do not blow your nose for 12 hours after the nosebleed.  Sit up and bend forward if your nose starts bleeding again. Pinch the front half of your nose nonstop for 20 minutes.  Put petroleum jelly inside your nose every morning if you have a dry nose.  Use a humidifier to make the air less dry.  Do not take aspirin.  Try not to strain, lift, or bend at the waist for many days after the nosebleed. GET HELP RIGHT AWAY IF:   Nosebleeds keep happening and are hard to stop or control.  You have bleeding or bruises that are not normal on other parts of the body.  You have a fever.  The nosebleeds get worse.  You get lightheaded, feel faint, sweaty, or throw up (vomit) blood. MAKE SURE YOU:   Understand these instructions.  Will watch your condition.  Will get help right away if you are not doing well or get worse. Document Released: 06/15/2008 Document Revised: 11/29/2011 Document Reviewed: 06/15/2008 [facility] Patient Information 2015 [place], [place]. This information is not intended to replace advice given to you by your health care provider. Make sure you discuss any questions you have with your health care provider.

## 2014-04-16 NOTE — ED Provider Notes (Signed)
CSN: [id number]     Arrival date & time 04/16/14  1151 History   First MD Initiated Contact with Patient 04/16/14 1503     Chief Complaint  Patient presents with  . Epistaxis     (Consider location/radiation/quality/duration/timing/severity/associated sxs/prior Treatment) HPI Patient presents with concerns epistaxis. We began approximately 5 hours prior to my evaluation, stopped approximately 10 minutes prior to my evaluation. 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 Patient was in his usual state of health, when he spontaneously began to bleed earlier today. Patient states that he has noninvasive ventilatory support at night.  Patient is prescribed moisturizing medication, but does not use this medication prior to using his BiPAP. Since onset, bleeding did not stop with anything. Patient denies concurrent fevers, chills, lightheadedness, syncope, chest pain, dyspnea. He does endorse mild nausea.  Past Medical History  Diagnosis Date  . Chest pain 2007, 2009    cardiac cath > normal coronary arterie; nl left ventricular function  . Cardiac conduction disorder     status post pacemaker implantation in 1995 generator replaced 2005  . Aortic stenosis     mild  . Mitral regurgitation     insignificant  . HTN (hypertension)   . Hyperlipidemia     statin discontinued due to abn LFTs  . Syncope   . Obesity   . Pelvic mass     identified in 2009, stable 2010  . 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 Diabetes mellitus     +Insulin  . Benign hypertrophy of prostate     with urinary retention; repaired hypospadia; transurethral resection of the prostate   . GERD (gastroesophageal reflux disease)     status post multiple dilatations for stricture  . Congenital deafness   . Sleep apnea     BiPAP and continuous oxygen  . Obesity 4/08    BMI=  40  . Anemia   . Alcohol use    Past Surgical History  Procedure Laterality Date  . Orchiectomy  1990    bilateral; ?neoplasm  . Repair hypospadias w/ urethroplasty    . Transurethral resection of prostate      for BPH  . Tonsillectomy and adenoidectomy    . A-v cardiac pacemaker insertion  1995  . Colonoscopy  2008  . Pacemaker insertion  05/2004; 10/04/2013    MDT dual chamber pacemaker; gen change 10/04/2013 (MDT ADDRL1)   Family History  Problem Relation Age of Onset  . COPD Mother   . Hypertension Mother   . Pneumonia Father   . Hypertension Other   . Heart disease Other    History  Substance Use Topics  . Smoking status: Never Smoker   . Smokeless tobacco: Never Used     Comment: tobacco use- no   . Alcohol Use: No    Review of Systems  Constitutional:       Per HPI, otherwise negative  HENT:       Per HPI, otherwise negative  Respiratory:       Per HPI, otherwise negative  Cardiovascular:       Per HPI, otherwise negative  Gastrointestinal: Negative for vomiting.  Endocrine:       Negative aside from HPI  Genitourinary:       Neg aside from HPI   Musculoskeletal:       Per HPI, otherwise negative  Skin:  Negative.   Neurological: Negative for syncope.      Allergies  Ciprofloxacin; Eliquis; Enalapril; Metformin and related; and Penicillins  Home Medications   Prior to Admission medications   Medication Sig Start Date End Date Taking? Authorizing Provider  albuterol (VENTOLIN HFA) 108 (90 BASE) MCG/ACT inhaler Inhale 2 puffs into the lungs every 6 (six) hours as needed for wheezing.    Yes Historical Provider, MD  cetirizine (ZYRTEC) 10 MG tablet Take 10 mg by mouth daily.   Yes Historical Provider, MD  Edoxaban Tosylate (SAVAYSA) 60 MG TABS Take 60 mg by mouth daily. 02/21/14  Yes Herminio Commons, MD  insulin aspart protamine- aspart (NOVOLOG MIX 70/30) (70-30) 100 UNIT/ML injection Inject 45-48 Units into the skin 2 (two) times daily. 45 units  with breakfast and 48 units with supper.   Yes Historical Provider, MD  levothyroxine (LEVOXYL) 50 MCG tablet Take 50 mcg by mouth daily.     Yes Historical Provider, MD  metoprolol tartrate (LOPRESSOR) 25 MG tablet Take 1 tablet (25 mg total) by mouth 2 (two) times daily. 07/30/13  Yes Arnoldo Lenis, MD  Misc Natural Products (OSTEO BI-FLEX TRIPLE STRENGTH PO) Take 1 tablet by mouth daily.   Yes Historical Provider, MD  mometasone (NASONEX) 50 MCG/ACT nasal spray Place 2 sprays into the nose daily as needed (for congestion).    Yes Historical Provider, MD  Multiple Vitamins-Minerals (CENTRUM) tablet Take 1 tablet by mouth daily.     Yes Historical Provider, MD  nitroGLYCERIN (NITROSTAT) 0.4 MG SL tablet Place 0.4 mg under the tongue every 5 (five) minutes as needed for chest pain.   Yes Historical Provider, MD  NOVOLOG 100 UNIT/ML injection Inject 5 Units into the skin 3 (three) times daily.  07/14/13  Yes Historical Provider, MD  omeprazole (PRILOSEC) 40 MG capsule Take 1 capsule (40 mg total) by mouth daily. 07/30/13  Yes Arnoldo Lenis, MD  Simethicone (GAS RELIEF 80 PO) Take 1 capsule by mouth daily.   Yes Historical Provider, MD  sodium chloride (OCEAN) 0.65 % SOLN nasal spray Place 1 spray into both nostrils as needed (nose bleeds).    Yes Historical Provider, MD   BP 122/83  Pulse 86  Temp(Src) 98.4 F (36.9 C) (Oral)  Resp 18  Ht 5\' 8"  (1.727 m)  Wt 190 lb (86.183 kg)  BMI 28.90 kg/m2  SpO2 92% Physical Exam  Nursing note and vitals reviewed. Constitutional: He is oriented to person, place, and time. He appears well-developed. No distress.  HENT:  Head: Normocephalic and atraumatic.  Within the both nostrils there is dried blood, no active bleeding.   Eyes: Conjunctivae and EOM are normal.  Cardiovascular: Normal rate and regular rhythm.   Pulmonary/Chest: Effort normal. No stridor. No respiratory distress.  Abdominal: He exhibits no distension.  Musculoskeletal: He  exhibits no edema.  Neurological: He is alert and oriented to person, place, and time.  Skin: Skin is warm and dry.  Psychiatric: He has a normal mood and affect.    ED Course  EPISTAXIS MANAGEMENT Date/Time: 04/16/2014 5:00 PM Performed by: Carmin Muskrat Authorized by: Carmin Muskrat Consent: Verbal consent obtained. The procedure was performed in an emergent situation. Risks and benefits: risks, benefits and alternatives were discussed Consent given by: patient Patient understanding: patient states understanding of the procedure being performed Patient consent: the patient's understanding of the procedure matches consent given Procedure consent: procedure consent matches procedure scheduled Relevant documents: relevant documents present and verified Test results:  test results available and properly labeled Site marked: the operative site was marked Imaging studies: imaging studies available Required items: required blood products, implants, devices, and special equipment available Patient identity confirmed: verbally with patient Anesthesia: see MAR for details Patient sedated: no Treatment site: left Kiesselbach's area Repair method: silver nitrate and cophenylcaine Post-procedure assessment: bleeding stopped Treatment complexity: complex Patient tolerance: Patient tolerated the procedure well with no immediate complications.   (including critical care time) Labs Review Labs Reviewed  CBC WITH DIFFERENTIAL - Abnormal; Notable for the following:    WBC 14.0 (*)    Hemoglobin 12.9 (*)    RDW 16.5 (*)    Neutro Abs 10.6 (*)    Monocytes Absolute 1.3 (*)    All other components within normal limits  COMPREHENSIVE METABOLIC PANEL - Abnormal; Notable for the following:    Glucose, Bld 297 (*)    Albumin 3.2 (*)    AST 42 (*)    Alkaline Phosphatase 273 (*)    Total Bilirubin 1.3 (*)    All other components within normal limits  CBC WITH DIFFERENTIAL    Update:  Patient is now having active bleeding again.  Data: Following treatment with silver nitroglycerin, there is no current bleeding.  MDM   Patient presents with epistaxis.  Patient's initial therapy with resolution of bleeding, but after blowing his nose, blood clot was disturbed.  Subsequently, bleeding cessation was obtained with packing, silver nitrate.  Patient had no new complaints throughout his course, remained hemodynamically stable, was discharged to follow up with ENT.  In addition, patient was referred to pulmonology, per his request   Carmin Muskrat, MD 04/16/14 [phone]

## 2014-04-16 NOTE — ED Notes (Signed)
PT applying pressure to bridge of nose. Bleeding has decreased at this time.

## 2014-04-23 ENCOUNTER — Encounter (HOSPITAL_COMMUNITY): Payer: Self-pay | Admitting: Emergency Medicine

## 2014-04-23 ENCOUNTER — Emergency Department (HOSPITAL_COMMUNITY)
Admission: EM | Admit: 2014-04-23 | Discharge: 2014-04-23 | Disposition: A | Discharge status: Home / Self Care | Payer: Medicare Other | Attending: Emergency Medicine | Admitting: Emergency Medicine

## 2014-04-23 DIAGNOSIS — Z87448 Personal history of other diseases of urinary system: Secondary | ICD-10-CM | POA: Diagnosis not present

## 2014-04-23 DIAGNOSIS — E669 Obesity, unspecified: Secondary | ICD-10-CM | POA: Diagnosis not present

## 2014-04-23 DIAGNOSIS — K219 Gastro-esophageal reflux disease without esophagitis: Secondary | ICD-10-CM | POA: Insufficient documentation

## 2014-04-23 DIAGNOSIS — IMO0002 Reserved for concepts with insufficient information to code with codable children: Secondary | ICD-10-CM | POA: Insufficient documentation

## 2014-04-23 DIAGNOSIS — D62 Acute posthemorrhagic anemia: Secondary | ICD-10-CM

## 2014-04-23 DIAGNOSIS — I10 Essential (primary) hypertension: Secondary | ICD-10-CM | POA: Diagnosis not present

## 2014-04-23 DIAGNOSIS — R040 Epistaxis: Secondary | ICD-10-CM | POA: Diagnosis not present

## 2014-04-23 DIAGNOSIS — G4730 Sleep apnea, unspecified: Secondary | ICD-10-CM | POA: Insufficient documentation

## 2014-04-23 DIAGNOSIS — E119 Type 2 diabetes mellitus without complications: Secondary | ICD-10-CM | POA: Diagnosis not present

## 2014-04-23 DIAGNOSIS — Z6841 Body Mass Index (BMI) 40.0 and over, adult: Secondary | ICD-10-CM | POA: Diagnosis not present

## 2014-04-23 DIAGNOSIS — N400 Enlarged prostate without lower urinary tract symptoms: Secondary | ICD-10-CM | POA: Insufficient documentation

## 2014-04-23 DIAGNOSIS — Z880 Allergy status to penicillin: Secondary | ICD-10-CM | POA: Insufficient documentation

## 2014-04-23 DIAGNOSIS — Z79899 Other long term (current) drug therapy: Secondary | ICD-10-CM | POA: Insufficient documentation

## 2014-04-23 DIAGNOSIS — Z794 Long term (current) use of insulin: Secondary | ICD-10-CM | POA: Insufficient documentation

## 2014-04-23 LAB — PROTIME-INR
INR: 2.05 — ABNORMAL HIGH (ref 0.00–1.49)
PROTHROMBIN TIME: 23.1 s — AB (ref 11.6–15.2)

## 2014-04-23 LAB — CBC
HCT: 31.3 % — ABNORMAL LOW (ref 39.0–52.0)
Hemoglobin: 9.9 g/dL — ABNORMAL LOW (ref 13.0–17.0)
MCH: 27.9 pg (ref 26.0–34.0)
MCHC: 31.6 g/dL (ref 30.0–36.0)
MCV: 88.2 fL (ref 78.0–100.0)
Platelets: 282 10*3/uL (ref 150–400)
RBC: 3.55 MIL/uL — AB (ref 4.22–5.81)
RDW: 17.4 % — AB (ref 11.5–15.5)
WBC: 16 10*3/uL — AB (ref 4.0–10.5)

## 2014-04-23 MED ORDER — OXYMETAZOLINE HCL 0.05 % NA SOLN
2.0000 | Freq: Once | NASAL | Status: AC
  Administered 2014-04-23: 2 via NASAL
  Filled 2014-04-23: qty 15

## 2014-04-23 NOTE — ED Notes (Signed)
Wife states pt picked at his nose all day long yesterday. Pt states he had a sore in his nose he was picking at

## 2014-04-23 NOTE — Discharge Instructions (Signed)
Anemia, Nonspecific Anemia is a condition in which the concentration of red blood cells or hemoglobin in the blood is below normal. Hemoglobin is a substance in red blood cells that carries oxygen to the tissues of the body. Anemia results in not enough oxygen reaching these tissues.  CAUSES  Common causes of anemia include:   Excessive bleeding. Bleeding may be internal or external. This includes excessive bleeding from periods (in women) or from the intestine.   Poor nutrition.   Chronic kidney, thyroid, and liver disease.  Bone marrow disorders that decrease red blood cell production.  Cancer and treatments for cancer.  HIV, AIDS, and their treatments.  Spleen problems that increase red blood cell destruction.  Blood disorders.  Excess destruction of red blood cells due to infection, medicines, and autoimmune disorders. SIGNS AND SYMPTOMS   Minor weakness.   Dizziness.   Headache.  Palpitations.   Shortness of breath, especially with exercise.   Paleness.  Cold sensitivity.  Indigestion.  Nausea.  Difficulty sleeping.  Difficulty concentrating. Symptoms may occur suddenly or they may develop slowly.  DIAGNOSIS  Additional blood tests are often needed. These help your health care provider determine the best treatment. Your health care provider will check your stool for blood and look for other causes of blood loss.  TREATMENT  Treatment varies depending on the cause of the anemia. Treatment can include:   Supplements of iron, vitamin E93, or folic acid.   Hormone medicines.   A blood transfusion. This may be needed if blood loss is severe.   Hospitalization. This may be needed if there is significant continual blood loss.   Dietary changes.  Spleen removal. HOME CARE INSTRUCTIONS Keep all follow-up appointments. It often takes many weeks to correct anemia, and having your health care provider check on your condition and your response to  treatment is very important. SEEK IMMEDIATE MEDICAL CARE IF:   You develop extreme weakness, shortness of breath, or chest pain.   You become dizzy or have trouble concentrating.  You develop heavy vaginal bleeding.   You develop a rash.   You have bloody or black, tarry stools.   You faint.   You vomit up blood.   You vomit repeatedly.   You have abdominal pain.  You have a fever or persistent symptoms for more than 2-3 days.   You have a fever and your symptoms suddenly get worse.   You are dehydrated.  MAKE SURE YOU:  Understand these instructions.  Will watch your condition.  Will get help right away if you are not doing well or get worse. Document Released: 10/14/2004 Document Revised: 05/09/2013 Document Reviewed: 03/02/2013 [facility] Patient Information 2015 [place], [place]. This information is not intended to replace advice given to you by your health care provider. Make sure you discuss any questions you have with your health care provider.    Nosebleed Nosebleeds can be caused by many conditions, including trauma, infections, polyps, foreign bodies, dry mucous membranes or climate, medicines, and air conditioning. Most nosebleeds occur in the front of the nose. Because of this location, most nosebleeds can be controlled by pinching the nostrils gently and continuously for at least 10 to 20 minutes. The long, continuous pressure allows enough time for the blood to clot. If pressure is released during that 10 to 20 minute time period, the process may have to be started again. The nosebleed may stop by itself or quit with pressure, or it may need concentrated heating (cautery) or pressure  from packing. HOME CARE INSTRUCTIONS   If your nose was packed, try to maintain the pack inside until your health care provider removes it. If a gauze pack was used and it starts to fall out, gently replace it or cut the end off. Do not cut if a balloon catheter was used to  pack the nose. Otherwise, do not remove unless instructed.  Avoid blowing your nose for 12 hours after treatment. This could dislodge the pack or clot and start the bleeding again.  If the bleeding starts again, sit up and bend forward, gently pinching the front half of your nose continuously for 20 minutes.  If bleeding was caused by dry mucous membranes, use over-the-counter saline nasal spray or gel. This will keep the mucous membranes moist and allow them to heal. If you must use a lubricant, choose the water-soluble variety. Use it only sparingly and not within several hours of lying down.  Do not use petroleum jelly or mineral oil, as these may drip into the lungs and cause serious problems.  Maintain humidity in your home by using less air conditioning or by using a humidifier.  Do not use aspirin or medicines which make bleeding more likely. Your health care provider can give you recommendations on this.  Resume normal activities as you are able, but try to avoid straining, lifting, or bending at the waist for several days.  If the nosebleeds become recurrent and the cause is unknown, your health care provider may suggest laboratory tests. SEEK MEDICAL CARE IF: You have a fever. SEEK IMMEDIATE MEDICAL CARE IF:   Bleeding recurs and cannot be controlled.  There is unusual bleeding from or bruising on other parts of the body.  Nosebleeds continue.  There is any worsening of the condition which originally brought you in.  You become light-headed, feel faint, become sweaty, or vomit blood. MAKE SURE YOU:   Understand these instructions.  Will watch your condition.  Will get help right away if you are not doing well or get worse. Document Released: 06/16/2005 Document Revised: 01/21/2014 Document Reviewed: 08/07/2009 [facility] Patient Information 2015 [place], [place]. This information is not intended to replace advice given to you by your health care provider. Make sure you  discuss any questions you have with your health care provider.

## 2014-04-23 NOTE — ED Notes (Signed)
Pt states he was eating and his nose suddenly started bleeding. Pt was here a few days ago for same. States he is on a blood thinner

## 2014-04-23 NOTE — ED Provider Notes (Signed)
CSN: [id number]     Arrival date & time 04/23/14  1400 History  This chart was scribed for Ephraim Hamburger, MD by Irene Pap, ED Scribe. This patient was seen in room APA14/[facility]14 and patient care was started at 2:12 PM.    Chief Complaint  Patient presents with  . Epistaxis   The history is provided by the patient. No language interpreter was used.   HPI Comments: [name] is a 53 y.o. male who presents to the Emergency Department complaining of bilateral epistaxis onset 2 hours ago. His wife states that he was eating lunch when his nose just started bleeding. She states that he is currently on Coumadin. She states that he was seen here a few days ago for the same symptoms.  His wife states that she caught him picking at his nose even though she told him to stop. Patient denies lightheadedness and dizziness or weakness.   Past Medical History  Diagnosis Date  . Chest pain 2007, 2009    cardiac cath > normal coronary arterie; nl left ventricular function  . Cardiac conduction disorder     status post pacemaker implantation in 1995 generator replaced 2005  . Aortic stenosis     mild  . Mitral regurgitation     insignificant  . HTN (hypertension)   . Hyperlipidemia     statin discontinued due to abn LFTs  . Syncope   . Obesity   . Pelvic mass     identified in 2009, stable 2010  . 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 Diabetes mellitus     +Insulin  . Benign hypertrophy of prostate     with urinary retention; repaired hypospadia; transurethral resection of the prostate   . GERD (gastroesophageal reflux disease)     status post multiple dilatations for stricture  . Congenital deafness   . Sleep apnea     BiPAP and continuous oxygen  . Obesity 4/08    BMI= 40  . Anemia   . Alcohol use    Past Surgical History  Procedure Laterality Date  . Orchiectomy  1990    bilateral; ?neoplasm  . Repair  hypospadias w/ urethroplasty    . Transurethral resection of prostate      for BPH  . Tonsillectomy and adenoidectomy    . A-v cardiac pacemaker insertion  1995  . Colonoscopy  2008  . Pacemaker insertion  05/2004; 10/04/2013    MDT dual chamber pacemaker; gen change 10/04/2013 (MDT ADDRL1)   Family History  Problem Relation Age of Onset  . COPD Mother   . Hypertension Mother   . Pneumonia Father   . Hypertension Other   . Heart disease Other    History  Substance Use Topics  . Smoking status: Never Smoker   . Smokeless tobacco: Never Used     Comment: tobacco use- no   . Alcohol Use: No    Review of Systems  HENT: Positive for nosebleeds.   Neurological: Negative for dizziness, weakness and light-headedness.  All other systems reviewed and are negative.  Allergies  Ciprofloxacin; Eliquis; Enalapril; Metformin and related; and Penicillins  Home Medications   Prior to Admission medications   Medication Sig Start Date End Date Taking? Authorizing Provider  albuterol (VENTOLIN HFA) 108 (90 BASE) MCG/ACT inhaler Inhale 2 puffs into the lungs every 6 (six) hours as needed for wheezing.     Historical Provider, MD  cetirizine (ZYRTEC) 10 MG tablet Take 10 mg by mouth daily.    Historical Provider, MD  Edoxaban Tosylate (SAVAYSA) 60 MG TABS Take 60 mg by mouth daily. 02/21/14   Herminio Commons, MD  insulin aspart protamine- aspart (NOVOLOG MIX 70/30) (70-30) 100 UNIT/ML injection Inject 45-48 Units into the skin 2 (two) times daily. 45 units with breakfast and 48 units with supper.    Historical Provider, MD  levothyroxine (LEVOXYL) 50 MCG tablet Take 50 mcg by mouth daily.      Historical Provider, MD  metoprolol tartrate (LOPRESSOR) 25 MG tablet Take 1 tablet (25 mg total) by mouth 2 (two) times daily. 07/30/13   Arnoldo Lenis, MD  Misc Natural Products (OSTEO BI-FLEX TRIPLE STRENGTH PO) Take 1 tablet by mouth daily.    Historical Provider, MD  mometasone (NASONEX) 50  MCG/ACT nasal spray Place 2 sprays into the nose daily as needed (for congestion).     Historical Provider, MD  Multiple Vitamins-Minerals (CENTRUM) tablet Take 1 tablet by mouth daily.      Historical Provider, MD  nitroGLYCERIN (NITROSTAT) 0.4 MG SL tablet Place 0.4 mg under the tongue every 5 (five) minutes as needed for chest pain.    Historical Provider, MD  NOVOLOG 100 UNIT/ML injection Inject 5 Units into the skin 3 (three) times daily.  07/14/13   Historical Provider, MD  omeprazole (PRILOSEC) 40 MG capsule Take 1 capsule (40 mg total) by mouth daily. 07/30/13   Arnoldo Lenis, MD  Simethicone (GAS RELIEF 80 PO) Take 1 capsule by mouth daily.    Historical Provider, MD  sodium chloride (OCEAN) 0.65 % SOLN nasal spray Place 1 spray into both nostrils as needed (nose bleeds).     Historical Provider, MD   BP 108/75  Pulse 99  Temp(Src) 97.9 F (36.6 C) (Oral)  Resp 22  Ht 5\' 3"  (1.6 m)  Wt 180 lb (81.647 kg)  BMI 31.89 kg/m2  SpO2 98%  Physical Exam  Nursing note and vitals reviewed. Constitutional: He is oriented to person, place, and time. He appears well-developed and well-nourished.  HENT:  Head: Normocephalic and atraumatic.  Nose: Epistaxis is observed.  After removal of clots there are small areas of possible bleeding anteriorly. No active bleeding or obvious lacerations  Eyes: EOM are normal.  Neck: Normal range of motion. Neck supple.  Cardiovascular: Normal rate.   Pulmonary/Chest: Effort normal and breath sounds normal.  Musculoskeletal: Normal range of motion.  Neurological: He is alert and oriented to person, place, and time.  Skin: Skin is warm and dry.  Psychiatric: He has a normal mood and affect. His behavior is normal.   ED Course  Procedures (including critical care time) DIAGNOSTIC STUDIES: Oxygen Saturation is 98% on room air, normal by my interpretation.    COORDINATION OF CARE: 2:14 PM-Discussed treatment plan which includes labs with pt at  bedside and pt agreed to plan.   Labs Review Labs Reviewed  CBC - Abnormal; Notable for the following:    WBC 16.0 (*)    RBC 3.55 (*)    Hemoglobin 9.9 (*)    HCT 31.3 (*)    RDW 17.4 (*)    All other components within normal limits  PROTIME-INR - Abnormal; Notable for the following:    Prothrombin Time 23.1 (*)    INR 2.05 (*)    All other components within normal limits    Imaging Review No results found.   EKG Interpretation None  MDM   Final diagnoses:  Epistaxis  Acute blood loss anemia    Patient's epistaxis stopped with direct pressure. After this clots were cleaned out and there is no obvious area of bleeding. Given his history of picking his nose he likely caused self-inflicted trauma causing the bleeding. He was watched in the ER with no recurrence of bleeding. He is on a blood thinner and I will have him hold this for his next dose. His hemoglobin is 9.9 compared to 12.9 last month. He is currently not symptomatic. His blood pressure is normal. I feel he is stable for discharge. He did not follow up with ENT as his PCP he needs to give him a referral. I encouraged him to follow up with his PCP as soon as possible to get this referral. Discussed her to return precautions and anemia precautions.  I personally performed the services described in this documentation, which was scribed in my presence. The recorded information has been reviewed and is accurate.    Ephraim Hamburger, MD 04/23/14 [phone]

## 2014-04-23 NOTE — ED Notes (Signed)
Nose no longer bleeding 

## 2014-04-24 ENCOUNTER — Telehealth: Payer: Self-pay | Admitting: *Deleted

## 2014-04-24 NOTE — Telephone Encounter (Signed)
Pt has been to the Er twice with nose bleeds, he has followed up with primary care and they think he needs to come off blood thinners.

## 2014-04-25 ENCOUNTER — Other Ambulatory Visit: Payer: Self-pay | Admitting: *Deleted

## 2014-04-25 NOTE — Telephone Encounter (Signed)
Petra Kuba, MD told pt he was going to hold off on the referral for ENT at this time. Made pt aware to stop Savaysa and start ASA 325 mg daily.

## 2014-04-25 NOTE — Telephone Encounter (Signed)
Can stop savaysa. Restart ASA 325mg  daily. He needs to see ENT, the ER note mentioned a possible referral from his pcp. Please ask patient if this has been arranged.   Zandra Abts MD

## 2014-04-29 ENCOUNTER — Ambulatory Visit (INDEPENDENT_AMBULATORY_CARE_PROVIDER_SITE_OTHER): Payer: Medicare Other | Admitting: *Deleted

## 2014-04-29 DIAGNOSIS — I498 Other specified cardiac arrhythmias: Secondary | ICD-10-CM

## 2014-04-29 NOTE — Progress Notes (Signed)
Remote pacemaker transmission.   

## 2014-04-30 LAB — MDC_IDC_ENUM_SESS_TYPE_REMOTE
Brady Statistic AP VS Percent: 0 %
Brady Statistic AS VS Percent: 100 %
Lead Channel Impedance Value: 388 Ohm
Lead Channel Pacing Threshold Amplitude: 0.5 V
Lead Channel Pacing Threshold Pulse Width: 0.4 ms
Lead Channel Sensing Intrinsic Amplitude: 1.4 mV
Lead Channel Setting Pacing Amplitude: 2.5 V
Lead Channel Setting Sensing Sensitivity: 2.8 mV
MDC IDC MSMT BATTERY IMPEDANCE: 100 Ohm
MDC IDC MSMT BATTERY REMAINING LONGEVITY: 157 mo
MDC IDC MSMT BATTERY VOLTAGE: 2.8 V
MDC IDC MSMT LEADCHNL RA IMPEDANCE VALUE: 280 Ohm
MDC IDC MSMT LEADCHNL RV SENSING INTR AMPL: 8 mV
MDC IDC SESS DTM: 20150810011212
MDC IDC SET LEADCHNL RV PACING AMPLITUDE: 2 V
MDC IDC SET LEADCHNL RV PACING PULSEWIDTH: 0.4 ms
MDC IDC STAT BRADY AP VP PERCENT: 0 %
MDC IDC STAT BRADY AS VP PERCENT: 0 %

## 2014-05-16 ENCOUNTER — Encounter: Payer: Self-pay | Admitting: Cardiology

## 2014-05-23 ENCOUNTER — Encounter: Payer: Self-pay | Admitting: Internal Medicine

## 2014-08-01 ENCOUNTER — Ambulatory Visit (INDEPENDENT_AMBULATORY_CARE_PROVIDER_SITE_OTHER): Payer: Medicare Other | Admitting: *Deleted

## 2014-08-01 ENCOUNTER — Encounter: Payer: Self-pay | Admitting: Internal Medicine

## 2014-08-01 DIAGNOSIS — I4891 Unspecified atrial fibrillation: Secondary | ICD-10-CM

## 2014-08-01 LAB — MDC_IDC_ENUM_SESS_TYPE_REMOTE
Brady Statistic AP VP Percent: 0 %
Brady Statistic AP VS Percent: 0 %
Brady Statistic AS VP Percent: 0 %
Brady Statistic AS VS Percent: 100 %
Date Time Interrogation Session: 20151112124400
Lead Channel Impedance Value: 290 Ohm
Lead Channel Impedance Value: 425 Ohm
Lead Channel Pacing Threshold Pulse Width: 0.4 ms
Lead Channel Sensing Intrinsic Amplitude: 16 mV
Lead Channel Sensing Intrinsic Amplitude: 4 mV
Lead Channel Setting Pacing Amplitude: 2 V
MDC IDC MSMT BATTERY IMPEDANCE: 100 Ohm
MDC IDC MSMT BATTERY REMAINING LONGEVITY: 157 mo
MDC IDC MSMT BATTERY VOLTAGE: 2.8 V
MDC IDC MSMT LEADCHNL RV PACING THRESHOLD AMPLITUDE: 0.5 V
MDC IDC SET LEADCHNL RA PACING AMPLITUDE: 2.5 V
MDC IDC SET LEADCHNL RV PACING PULSEWIDTH: 0.4 ms
MDC IDC SET LEADCHNL RV SENSING SENSITIVITY: 2.8 mV

## 2014-08-02 NOTE — Progress Notes (Signed)
Remote pacemaker transmission.   

## 2014-08-16 ENCOUNTER — Other Ambulatory Visit: Payer: Self-pay | Admitting: Cardiology

## 2014-08-29 ENCOUNTER — Encounter (HOSPITAL_COMMUNITY): Payer: Self-pay | Admitting: Internal Medicine

## 2014-09-05 ENCOUNTER — Encounter: Payer: Self-pay | Admitting: Cardiology

## 2014-09-10 ENCOUNTER — Encounter: Payer: Self-pay | Admitting: Cardiology

## 2014-09-10 ENCOUNTER — Ambulatory Visit (INDEPENDENT_AMBULATORY_CARE_PROVIDER_SITE_OTHER): Payer: Medicare Other | Admitting: Cardiology

## 2014-09-10 VITALS — BP 122/90 | HR 62 | Ht 63.0 in | Wt 202.0 lb

## 2014-09-10 DIAGNOSIS — I350 Nonrheumatic aortic (valve) stenosis: Secondary | ICD-10-CM

## 2014-09-10 DIAGNOSIS — I4891 Unspecified atrial fibrillation: Secondary | ICD-10-CM

## 2014-09-10 DIAGNOSIS — I10 Essential (primary) hypertension: Secondary | ICD-10-CM

## 2014-09-10 NOTE — Patient Instructions (Signed)

## 2014-09-10 NOTE — Progress Notes (Signed)
Clinical Summary Mr. Chaput is a 53 y.o.male seen today for follow up of the following medical problems.   1. Atrial flutter  - denies any palpitations, he is on metoprolol for rate control - long term history of nose bleeds. We tried several anticoag agents, however all severely increased his nose bleeds requiring ER visits and nose packing. He states he has seen ENT before and told he has a bleeding vessel that there is nothing can be done for.   2. Moderate aortic stenosis - denies any chest pain, SOB, lightheadedness, or syncope  3. Tachy-Brady syndrme - last check 07/2014 with normal function - denies any lightheadess or dizziness  4. HTN - compliant with meds - checks blood pressures regularly, typically 120s/70s  5. DM - followed by pcp    Past Medical History  Diagnosis Date  . Chest pain 2007, 2009    cardiac cath > normal coronary arterie; nl left ventricular function  . Cardiac conduction disorder     status post pacemaker implantation in 1995 generator replaced 2005  . Aortic stenosis     mild  . Mitral regurgitation     insignificant  . HTN (hypertension)   . Hyperlipidemia     statin discontinued due to abn LFTs  . Syncope   . Obesity   . Pelvic mass     identified in 2009, stable 2010  . 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 Diabetes mellitus     +Insulin  . Benign hypertrophy of prostate     with urinary retention; repaired hypospadia; transurethral resection of the prostate   . GERD (gastroesophageal reflux disease)     status post multiple dilatations for stricture  . Congenital deafness   . Sleep apnea     BiPAP and continuous oxygen  . Obesity 4/08    BMI= 40  . Anemia   . Alcohol use      Allergies  Allergen Reactions  . Ciprofloxacin     Itching and red rash up arm when infusing after 3rd cipro dose for uti on 10/27/13  . Eliquis [Apixaban] Other (See  Comments)    bleeding  . Enalapril Other (See Comments)    Cough  . Metformin And Related     Severe diaarhea  . Penicillins Other (See Comments)    Reaction unspecified     Current Outpatient Prescriptions  Medication Sig Dispense Refill  . albuterol (VENTOLIN HFA) 108 (90 BASE) MCG/ACT inhaler Inhale 2 puffs into the lungs every 6 (six) hours as needed for wheezing.     Marland Kitchen aspirin 325 MG EC tablet Take 325 mg by mouth daily.    . budesonide-formoterol (SYMBICORT) 160-4.5 MCG/ACT inhaler Inhale 2 puffs into the lungs 2 (two) times daily.    . cetirizine (ZYRTEC) 10 MG tablet Take 10 mg by mouth daily.    . insulin NPH-regular Human (NOVOLIN 70/30) (70-30) 100 UNIT/ML injection Inject 68-78 Units into the skin 2 (two) times daily with a meal.    . levothyroxine (LEVOXYL) 50 MCG tablet Take 50 mcg by mouth daily.      . metoprolol tartrate (LOPRESSOR) 25 MG tablet TAKE 1 TABLET BY MOUTH TWICE DAILY 60 tablet 11  . Misc Natural Products (OSTEO BI-FLEX TRIPLE STRENGTH PO) Take 1 tablet by mouth daily.    . mometasone (NASONEX) 50 MCG/ACT nasal spray Place 2 sprays into the nose daily as needed (  for congestion).     . Multiple Vitamins-Minerals (CENTRUM) tablet Take 1 tablet by mouth daily.      . nitroGLYCERIN (NITROSTAT) 0.4 MG SL tablet Place 0.4 mg under the tongue every 5 (five) minutes as needed for chest pain.    Marland Kitchen omeprazole (PRILOSEC) 40 MG capsule TAKE (1) CAPSULE BY MOUTH ONCE DAILY. 30 capsule 11  . Simethicone (GAS RELIEF 80 PO) Take 1 capsule by mouth daily as needed (gas).     . sodium chloride (OCEAN) 0.65 % SOLN nasal spray Place 1 spray into both nostrils as needed (nose bleeds).      No current facility-administered medications for this visit.     Past Surgical History  Procedure Laterality Date  . Orchiectomy  1990    bilateral; ?neoplasm  . Repair hypospadias w/ urethroplasty    . Transurethral resection of prostate      for BPH  . Tonsillectomy and  adenoidectomy    . A-v cardiac pacemaker insertion  1995  . Colonoscopy  2008  . Pacemaker insertion  05/2004; 10/04/2013    MDT dual chamber pacemaker; gen change 10/04/2013 (MDT ADDRL1)  . Permanent pacemaker generator change N/A 10/04/2013    Procedure: PERMANENT PACEMAKER GENERATOR CHANGE;  Surgeon: Evans Lance, MD;  Location: [facility] CATH LAB;  Service: Cardiovascular;  Laterality: N/A;     Allergies  Allergen Reactions  . Ciprofloxacin     Itching and red rash up arm when infusing after 3rd cipro dose for uti on 10/27/13  . Eliquis [Apixaban] Other (See Comments)    bleeding  . Enalapril Other (See Comments)    Cough  . Metformin And Related     Severe diaarhea  . Penicillins Other (See Comments)    Reaction unspecified      Family History  Problem Relation Age of Onset  . COPD Mother   . Hypertension Mother   . Pneumonia Father   . Hypertension Other   . Heart disease Other      Social History Mr. Innocent reports that he has never smoked. He has never used smokeless tobacco. Mr. Dyas reports that he does not drink alcohol.   Review of Systems CONSTITUTIONAL: No weight loss, fever, chills, weakness or fatigue.  HEENT: Eyes: No visual loss, blurred vision, double vision or yellow sclerae.No hearing loss, sneezing, congestion, runny nose or sore throat.  SKIN: No rash or itching.  CARDIOVASCULAR: per HPI RESPIRATORY: No shortness of breath, cough or sputum.  GASTROINTESTINAL: No anorexia, nausea, vomiting or diarrhea. No abdominal pain or blood.  GENITOURINARY: No burning on urination, no polyuria NEUROLOGICAL: No headache, dizziness, syncope, paralysis, ataxia, numbness or tingling in the extremities. No change in bowel or bladder control.  MUSCULOSKELETAL: No muscle, back pain, joint pain or stiffness.  LYMPHATICS: No enlarged nodes. No history of splenectomy.  PSYCHIATRIC: No history of depression or anxiety.  ENDOCRINOLOGIC: No reports of sweating, cold or  heat intolerance. No polyuria or polydipsia.  Marland Kitchen   Physical Examination Filed Vitals:   09/10/14 0805  BP: 122/90  Pulse: 62   Filed Weights   09/10/14 0805  Weight: 202 lb (91.627 kg)    Gen: resting comfortably, no acute distress HEENT: no scleral icterus, pupils equal round and reactive, no palptable cervical adenopathy,  CV: irreg, 3/6 systolic murmur RUSB, no JVD Resp: Clear to auscultation bilaterally GI: abdomen is soft, non-tender, non-distended, normal bowel sounds, no hepatosplenomegaly MSK: extremities are warm, no edema.  Skin: warm, no rash Neuro:  no focal deficits Psych: appropriate affect   Diagnostic Studies 02/2013 Echo limited evaluate AS: mild AS (mean grad 16   01/2013 Echo: difficult study, LV function mildly reduced ( no percentage given), possible moderate to severe AS (AVA .53) mean grad 27 mmHg.   07/30/13 Clinic EKG: sinus rhythm, 1st degree AV block, RBBB   Jan 2015 Echo  LVEF 55-60%, cannot eval diastolic function, mod AS (area 1, mean grad 20).    02/2014 Carotid US IMPRESSION: No evidence of internal carotid artery stenosis.  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  Assessment and Plan  1. Atrial flutter  - continue rate control Severe nose bleeds on several anticoag agents, will continue ASA  2. Aortic stenosis - moderate by most recent echo Jan 2015, no current symptoms - repeat echo Jan 2016  4. Tachy-Brady syndrome - no current symptoms, normal pacer function on last check. Continue routine device checks  5. DM - management per pcpt  6. HTN - at goal, continue current meds  F/u 6 months   Arnoldo Lenis, M.D

## 2014-10-03 ENCOUNTER — Encounter (HOSPITAL_COMMUNITY): Payer: Self-pay | Admitting: Emergency Medicine

## 2014-10-03 ENCOUNTER — Emergency Department (HOSPITAL_COMMUNITY)
Admission: EM | Admit: 2014-10-03 | Discharge: 2014-10-03 | Disposition: A | Discharge status: Home / Self Care | Payer: Medicare Other | Attending: Emergency Medicine | Admitting: Emergency Medicine

## 2014-10-03 DIAGNOSIS — Z87438 Personal history of other diseases of male genital organs: Secondary | ICD-10-CM | POA: Diagnosis not present

## 2014-10-03 DIAGNOSIS — H905 Unspecified sensorineural hearing loss: Secondary | ICD-10-CM | POA: Insufficient documentation

## 2014-10-03 DIAGNOSIS — Z9981 Dependence on supplemental oxygen: Secondary | ICD-10-CM | POA: Diagnosis not present

## 2014-10-03 DIAGNOSIS — I340 Nonrheumatic mitral (valve) insufficiency: Secondary | ICD-10-CM | POA: Insufficient documentation

## 2014-10-03 DIAGNOSIS — Z9889 Other specified postprocedural states: Secondary | ICD-10-CM | POA: Diagnosis not present

## 2014-10-03 DIAGNOSIS — R040 Epistaxis: Secondary | ICD-10-CM | POA: Diagnosis present

## 2014-10-03 DIAGNOSIS — K219 Gastro-esophageal reflux disease without esophagitis: Secondary | ICD-10-CM | POA: Diagnosis not present

## 2014-10-03 DIAGNOSIS — E669 Obesity, unspecified: Secondary | ICD-10-CM | POA: Diagnosis not present

## 2014-10-03 DIAGNOSIS — G4730 Sleep apnea, unspecified: Secondary | ICD-10-CM | POA: Diagnosis not present

## 2014-10-03 DIAGNOSIS — Z7982 Long term (current) use of aspirin: Secondary | ICD-10-CM | POA: Diagnosis not present

## 2014-10-03 DIAGNOSIS — Z79899 Other long term (current) drug therapy: Secondary | ICD-10-CM | POA: Insufficient documentation

## 2014-10-03 DIAGNOSIS — Z950 Presence of cardiac pacemaker: Secondary | ICD-10-CM | POA: Diagnosis not present

## 2014-10-03 DIAGNOSIS — Z880 Allergy status to penicillin: Secondary | ICD-10-CM | POA: Diagnosis not present

## 2014-10-03 DIAGNOSIS — D649 Anemia, unspecified: Secondary | ICD-10-CM | POA: Diagnosis not present

## 2014-10-03 DIAGNOSIS — E119 Type 2 diabetes mellitus without complications: Secondary | ICD-10-CM | POA: Insufficient documentation

## 2014-10-03 DIAGNOSIS — I10 Essential (primary) hypertension: Secondary | ICD-10-CM | POA: Diagnosis not present

## 2014-10-03 DIAGNOSIS — E785 Hyperlipidemia, unspecified: Secondary | ICD-10-CM | POA: Diagnosis not present

## 2014-10-03 DIAGNOSIS — Z7951 Long term (current) use of inhaled steroids: Secondary | ICD-10-CM | POA: Insufficient documentation

## 2014-10-03 DIAGNOSIS — Z794 Long term (current) use of insulin: Secondary | ICD-10-CM | POA: Insufficient documentation

## 2014-10-03 LAB — CBC WITH DIFFERENTIAL/PLATELET
Basophils Absolute: 0.1 10*3/uL (ref 0.0–0.1)
Basophils Relative: 1 % (ref 0–1)
EOS PCT: 2 % (ref 0–5)
Eosinophils Absolute: 0.2 10*3/uL (ref 0.0–0.7)
HEMATOCRIT: 42.2 % (ref 39.0–52.0)
HEMOGLOBIN: 13.4 g/dL (ref 13.0–17.0)
LYMPHS ABS: 1.2 10*3/uL (ref 0.7–4.0)
LYMPHS PCT: 12 % (ref 12–46)
MCH: 27 pg (ref 26.0–34.0)
MCHC: 31.8 g/dL (ref 30.0–36.0)
MCV: 84.9 fL (ref 78.0–100.0)
Monocytes Absolute: 1.4 10*3/uL — ABNORMAL HIGH (ref 0.1–1.0)
Monocytes Relative: 14 % — ABNORMAL HIGH (ref 3–12)
Neutro Abs: 6.8 10*3/uL (ref 1.7–7.7)
Neutrophils Relative %: 71 % (ref 43–77)
Platelets: 209 10*3/uL (ref 150–400)
RBC: 4.97 MIL/uL (ref 4.22–5.81)
RDW: 17.7 % — ABNORMAL HIGH (ref 11.5–15.5)
WBC: 9.6 10*3/uL (ref 4.0–10.5)

## 2014-10-03 LAB — CBG MONITORING, ED: Glucose-Capillary: 167 mg/dL — ABNORMAL HIGH (ref 70–99)

## 2014-10-03 MED ORDER — BENZOCAINE 20 % MT SOLN
OROMUCOSAL | Status: AC
  Administered 2014-10-03: 20:00:00
  Filled 2014-10-03: qty 57

## 2014-10-03 MED ORDER — SILVER NITRATE-POT NITRATE 75-25 % EX MISC
CUTANEOUS | Status: AC
  Administered 2014-10-03: 20:00:00
  Filled 2014-10-03: qty 1

## 2014-10-03 MED ORDER — OXYMETAZOLINE HCL 0.05 % NA SOLN
NASAL | Status: AC
  Administered 2014-10-03: 20:00:00
  Filled 2014-10-03: qty 15

## 2014-10-03 MED ORDER — SILVER NITRATE-POT NITRATE 75-25 % EX MISC
CUTANEOUS | Status: AC
  Filled 2014-10-03: qty 1

## 2014-10-03 NOTE — ED Notes (Signed)
Pt reports getting over a cold recently and went to sneeze while out in the waiting room with significant other and nose began to bleed. Pt applying pressure to nose. No active bleeding noted at this time.

## 2014-10-03 NOTE — ED Notes (Addendum)
Pt reports is supposed to be on 2 liters [place] at all times.Talked with PA concerning pt acuity. PA reports due to pt v/s pt should be assessed as a urgent acuity.

## 2014-10-03 NOTE — Discharge Instructions (Signed)
Nosebleed °A nosebleed can be caused by many things, including: °· Getting hit hard in the nose. °· Infections. °· Dry nose. °· Colds. °· Medicines. °Your doctor may do lab testing if you get nosebleeds a lot and the cause is not known. °HOME CARE  °· If your nose was packed with material, keep it there until your doctor takes it out. Put the pack back in your nose if the pack falls out. °· Do not blow your nose for 12 hours after the nosebleed. °· Sit up and bend forward if your nose starts bleeding again. Pinch the front half of your nose nonstop for 20 minutes. °· Put petroleum jelly inside your nose every morning if you have a dry nose. °· Use a humidifier to make the air less dry. °· Do not take aspirin. °· Try not to strain, lift, or bend at the waist for many days after the nosebleed. °GET HELP RIGHT AWAY IF:  °· Nosebleeds keep happening and are hard to stop or control. °· You have bleeding or bruises that are not normal on other parts of the body. °· You have a fever. °· The nosebleeds get worse. °· You get lightheaded, feel faint, sweaty, or throw up (vomit) blood. °MAKE SURE YOU:  °· Understand these instructions. °· Will watch your condition. °· Will get help right away if you are not doing well or get worse. °Document Released: 06/15/2008 Document Revised: 11/29/2011 Document Reviewed: 06/15/2008 °ExitCare® Patient Information ©2015 [employer]. This information is not intended to replace advice given to you by your health care provider. Make sure you discuss any questions you have with your health care provider. ° °

## 2014-10-03 NOTE — ED Provider Notes (Signed)
CSN: [id number]     Arrival date & time 10/03/14  1727 History  This chart was scribed for Orpah Greek, * by Molli Posey, ED Scribe. This patient was seen in room APA10/APA10 and the patient's care was started 7:17 PM.      Chief Complaint  Patient presents with  . Epistaxis   The history is provided by the patient. No language interpreter was used.   HPI Comments: LOCHLANN MASTRANGELO is a 54 y.o. male with a history of HTN and DM who presents to the Emergency Department complaining of a left sided nose bleed that started in the ED waiting room. Pt reports he is getting over a cold with a cough for the last several days. He states he went to sneeze while out in the waiting room and his nose began to bleed. He reports he has had similar episodes in the past. Pt reports no alleviating or modifying factors at this time.    Past Medical History  Diagnosis Date  . Chest pain 2007, 2009    cardiac cath > normal coronary arterie; nl left ventricular function  . Cardiac conduction disorder     status post pacemaker implantation in 1995 generator replaced 2005  . Aortic stenosis     mild  . Mitral regurgitation     insignificant  . HTN (hypertension)   . Hyperlipidemia     statin discontinued due to abn LFTs  . Syncope   . Obesity   . Pelvic mass     identified in 2009, stable 2010  . 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 Diabetes mellitus     +Insulin  . Benign hypertrophy of prostate     with urinary retention; repaired hypospadia; transurethral resection of the prostate   . GERD (gastroesophageal reflux disease)     status post multiple dilatations for stricture  . Congenital deafness   . Sleep apnea     BiPAP and continuous oxygen  . Obesity 4/08    BMI= 40  . Anemia   . Alcohol use    Past Surgical History  Procedure Laterality Date  . Orchiectomy  1990    bilateral; ?neoplasm  . Repair  hypospadias w/ urethroplasty    . Transurethral resection of prostate      for BPH  . Tonsillectomy and adenoidectomy    . A-v cardiac pacemaker insertion  1995  . Colonoscopy  2008  . Pacemaker insertion  05/2004; 10/04/2013    MDT dual chamber pacemaker; gen change 10/04/2013 (MDT ADDRL1)  . Permanent pacemaker generator change N/A 10/04/2013    Procedure: PERMANENT PACEMAKER GENERATOR CHANGE;  Surgeon: Evans Lance, MD;  Location: [facility] CATH LAB;  Service: Cardiovascular;  Laterality: N/A;   Family History  Problem Relation Age of Onset  . COPD Mother   . Hypertension Mother   . Pneumonia Father   . Hypertension Other   . Heart disease Other    History  Substance Use Topics  . Smoking status: Never Smoker   . Smokeless tobacco: Never Used     Comment: tobacco use- no   . Alcohol Use: No    Review of Systems  HENT:       Epistaxis   Respiratory: Positive for cough.     Allergies  Ciprofloxacin; Eliquis; Enalapril; Metformin and related; and Penicillins  Home Medications   Prior to Admission medications   Medication Sig Start  Date End Date Taking? Authorizing Provider  albuterol (VENTOLIN HFA) 108 (90 BASE) MCG/ACT inhaler Inhale 2 puffs into the lungs every 6 (six) hours as needed for wheezing.     Historical Provider, MD  aspirin 325 MG EC tablet Take 325 mg by mouth daily.    Historical Provider, MD  budesonide-formoterol (SYMBICORT) 160-4.5 MCG/ACT inhaler Inhale 2 puffs into the lungs 2 (two) times daily.    Historical Provider, MD  cetirizine (ZYRTEC) 10 MG tablet Take 10 mg by mouth daily.    Historical Provider, MD  insulin NPH-regular Human (NOVOLIN 70/30) (70-30) 100 UNIT/ML injection Inject 68-78 Units into the skin 2 (two) times daily with a meal.    Historical Provider, MD  levothyroxine (LEVOXYL) 50 MCG tablet Take 50 mcg by mouth daily.      Historical Provider, MD  metoprolol tartrate (LOPRESSOR) 25 MG tablet TAKE 1 TABLET BY MOUTH TWICE DAILY 08/19/14    Arnoldo Lenis, MD  Misc Natural Products (OSTEO BI-FLEX TRIPLE STRENGTH PO) Take 1 tablet by mouth daily.    Historical Provider, MD  mometasone (NASONEX) 50 MCG/ACT nasal spray Place 2 sprays into the nose daily as needed (for congestion).     Historical Provider, MD  Multiple Vitamins-Minerals (CENTRUM) tablet Take 1 tablet by mouth daily.      Historical Provider, MD  nitroGLYCERIN (NITROSTAT) 0.4 MG SL tablet Place 0.4 mg under the tongue every 5 (five) minutes as needed for chest pain.    Historical Provider, MD  omeprazole (PRILOSEC) 40 MG capsule TAKE (1) CAPSULE BY MOUTH ONCE DAILY. 08/19/14   Arnoldo Lenis, MD  Simethicone (GAS RELIEF 80 PO) Take 1 capsule by mouth daily as needed (gas).     Historical Provider, MD  sodium chloride (OCEAN) 0.65 % SOLN nasal spray Place 1 spray into both nostrils as needed (nose bleeds).     Historical Provider, MD   BP 175/96 mmHg  Pulse 95  Temp(Src) 98.4 F (36.9 C) (Oral)  Resp 18  Wt 202 lb 3.2 oz (91.717 kg)  SpO2 91% Physical Exam  Constitutional: He is oriented to person, place, and time. He appears well-developed and well-nourished. No distress.  HENT:  Head: Normocephalic and atraumatic.  Right Ear: Hearing normal.  Left Ear: Hearing normal.  Nose: Nose normal.  Mouth/Throat: Oropharynx is clear and moist and mucous membranes are normal.  Blood oozing from the left side of the septum.   Eyes: Conjunctivae and EOM are normal. Pupils are equal, round, and reactive to light.  Neck: Normal range of motion. Neck supple.  Cardiovascular: Regular rhythm, S1 normal and S2 normal.  Exam reveals no gallop and no friction rub.   No murmur heard. Pulmonary/Chest: Effort normal and breath sounds normal. No respiratory distress. He exhibits no tenderness.  Abdominal: Soft. Normal appearance and bowel sounds are normal. There is no hepatosplenomegaly. There is no tenderness. There is no rebound, no guarding, no tenderness at McBurney's point  and negative Murphy's sign. No hernia.  Musculoskeletal: Normal range of motion.  Neurological: He is alert and oriented to person, place, and time. He has normal strength. No cranial nerve deficit or sensory deficit. Coordination normal. GCS eye subscore is 4. GCS verbal subscore is 5. GCS motor subscore is 6.  Skin: Skin is warm, dry and intact. No rash noted. No cyanosis.  Psychiatric: He has a normal mood and affect. His speech is normal and behavior is normal. Thought content normal.  Nursing note and vitals reviewed.  ED Course  Procedures    Cauterization of nosebleed: Small amount of Afrin was sprayed into the left nasal passage. Patient cleared the clots with blowing his nose. Topical Hurricaine spray was used to anesthetize the mucosa. The bleeding source was identified. This was cauterized with 2 silver nitrate sticks. Patient tolerated procedure well. A medications.  DIAGNOSTIC STUDIES: Oxygen Saturation is 91% on RA, normal by my interpretation.    COORDINATION OF CARE: 7:19 PM Discussed treatment plan with pt at bedside and pt agreed to plan.   Labs Review Labs Reviewed  CBC WITH DIFFERENTIAL - Abnormal; Notable for the following:    RDW 17.7 (*)    Monocytes Relative 14 (*)    Monocytes Absolute 1.4 (*)    All other components within normal limits    Imaging Review No results found.   EKG Interpretation None      MDM   Final diagnoses:  Epistaxis   Patient presents to the ER for evaluation of nosebleed. Patient was actually here with family visiting and sneeze, started having bleeding from the left side of his nose. Patient normally uses nasal cannula oxygen, is not currently on it. Oxygen saturations were therefore slightly diminished because he is off oxygen, but he is breathing comfortably. He does report that he has had some cough and congestion over the last several days, likely contributing to the nosebleed. It was a clear, no acute wheezing, no  clinical concern for pneumonia.  Patient's nose was cauterized and bleeding stopped.  I personally performed the services described in this documentation, which was scribed in my presence. The recorded information has been reviewed and is accurate.       Orpah Greek, MD 10/03/14 2005

## 2014-11-08 ENCOUNTER — Encounter: Payer: Self-pay | Admitting: Internal Medicine

## 2014-11-08 ENCOUNTER — Ambulatory Visit (INDEPENDENT_AMBULATORY_CARE_PROVIDER_SITE_OTHER): Payer: Medicare Other | Admitting: Internal Medicine

## 2014-11-08 VITALS — BP 124/82 | HR 80 | Ht 63.0 in | Wt 201.0 lb

## 2014-11-08 DIAGNOSIS — I5032 Chronic diastolic (congestive) heart failure: Secondary | ICD-10-CM

## 2014-11-08 DIAGNOSIS — E785 Hyperlipidemia, unspecified: Secondary | ICD-10-CM

## 2014-11-08 DIAGNOSIS — I482 Chronic atrial fibrillation, unspecified: Secondary | ICD-10-CM

## 2014-11-08 DIAGNOSIS — Z950 Presence of cardiac pacemaker: Secondary | ICD-10-CM

## 2014-11-08 DIAGNOSIS — R001 Bradycardia, unspecified: Secondary | ICD-10-CM

## 2014-11-08 LAB — MDC_IDC_ENUM_SESS_TYPE_INCLINIC
Battery Impedance: 100 Ohm
Battery Remaining Longevity: 163 mo
Battery Voltage: 2.8 V
Brady Statistic AS VP Percent: 0 %
Date Time Interrogation Session: 20160219111030
Lead Channel Impedance Value: 453 Ohm
Lead Channel Pacing Threshold Amplitude: 0.5 V
Lead Channel Pacing Threshold Amplitude: 0.5 V
Lead Channel Pacing Threshold Pulse Width: 0.4 ms
Lead Channel Pacing Threshold Pulse Width: 0.4 ms
Lead Channel Sensing Intrinsic Amplitude: 11.2 mV
Lead Channel Setting Sensing Sensitivity: 2.8 mV
MDC IDC MSMT LEADCHNL RA IMPEDANCE VALUE: 315 Ohm
MDC IDC MSMT LEADCHNL RA SENSING INTR AMPL: 2.8 mV
MDC IDC SET LEADCHNL RA PACING AMPLITUDE: 2.5 V
MDC IDC SET LEADCHNL RV PACING AMPLITUDE: 2 V
MDC IDC SET LEADCHNL RV PACING PULSEWIDTH: 0.4 ms
MDC IDC STAT BRADY AP VP PERCENT: 0 %
MDC IDC STAT BRADY AP VS PERCENT: 0 %
MDC IDC STAT BRADY AS VS PERCENT: 100 %

## 2014-11-08 LAB — PACEMAKER DEVICE OBSERVATION

## 2014-11-08 NOTE — Assessment & Plan Note (Signed)
His symptoms are class 2A. I have asked him to maintain a low sodium diet. No change in meds.

## 2014-11-08 NOTE — Assessment & Plan Note (Signed)
He is now chronically in atrial fib. No change in meds. He is not on systemic anti-coagulation due to a risk of bleeding. He has had multiple bleeds on systemic anti-coagulation in the past.

## 2014-11-08 NOTE — Assessment & Plan Note (Signed)
He is overweight. I have asked him to try and lose weight and maintain a low fat diet.

## 2014-11-08 NOTE — Assessment & Plan Note (Signed)
His Medtronic dual chamber pacemaker is working normally. Will recheck in several months.

## 2014-11-08 NOTE — Progress Notes (Signed)
HPI [name] to returns today for followup of diastolic heart failure. He is a very pleasant 54 yo man with a h/o atrial fibrillation, symptomatic tachy-brady syndrome, status post pacemaker insertion, HTN, diastolic heart failure and syncope. He denies chest pain or sob. Minimal palpitations. trace peripheral edema. . Allergies  Allergen Reactions  . Ciprofloxacin     Itching and red rash up arm when infusing after 3rd cipro dose for uti on 10/27/13  . Eliquis [Apixaban] Other (See Comments)    bleeding  . Enalapril Other (See Comments)    Cough  . Metformin And Related     Severe diaarhea  . Penicillins Other (See Comments)    Reaction unspecified     Current Outpatient Prescriptions  Medication Sig Dispense Refill  . albuterol (VENTOLIN HFA) 108 (90 BASE) MCG/ACT inhaler Inhale 2 puffs into the lungs every 6 (six) hours as needed for wheezing.     Marland Kitchen aspirin 325 MG EC tablet Take 325 mg by mouth daily.    . budesonide-formoterol (SYMBICORT) 160-4.5 MCG/ACT inhaler Inhale 2 puffs into the lungs 2 (two) times daily.    . cetirizine (ZYRTEC) 10 MG tablet Take 10 mg by mouth daily.    . insulin lispro protamine-lispro (HUMALOG 75/25 MIX) (75-25) 100 UNIT/ML SUSP injection Inject 100 Units into the skin 2 (two) times daily with a meal. Inject S.Q. 80 units with breakfast and 90 units with supper    . levothyroxine (LEVOXYL) 50 MCG tablet Take 50 mcg by mouth daily.      . metoprolol tartrate (LOPRESSOR) 25 MG tablet TAKE 1 TABLET BY MOUTH TWICE DAILY 60 tablet 11  . Misc Natural Products (OSTEO BI-FLEX TRIPLE STRENGTH PO) Take 1 tablet by mouth daily.    . mometasone (NASONEX) 50 MCG/ACT nasal spray Place 2 sprays into the nose daily as needed (for congestion).     . Multiple Vitamins-Minerals (CENTRUM) tablet Take 1 tablet by mouth daily.      . nitroGLYCERIN (NITROSTAT) 0.4 MG SL tablet Place 0.4 mg under the tongue every 5 (five) minutes as needed for chest pain.    Marland Kitchen  omeprazole (PRILOSEC) 40 MG capsule TAKE (1) CAPSULE BY MOUTH ONCE DAILY. 30 capsule 11  . Simethicone (GAS RELIEF 80 PO) Take 1 capsule by mouth daily as needed (gas).     . sodium chloride (OCEAN) 0.65 % SOLN nasal spray Place 1 spray into both nostrils as needed (nose bleeds).      No current facility-administered medications for this visit.     Past Medical History  Diagnosis Date  . Chest pain 2007, 2009    cardiac cath > normal coronary arterie; nl left ventricular function  . Cardiac conduction disorder     status post pacemaker implantation in 1995 generator replaced 2005  . Aortic stenosis     mild  . Mitral regurgitation     insignificant  . HTN (hypertension)   . Hyperlipidemia     statin discontinued due to abn LFTs  . Syncope   . Obesity   . Pelvic mass     identified in 2009, stable 2010  . 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 Diabetes mellitus     +Insulin  . Benign hypertrophy of prostate     with urinary retention; repaired hypospadia; transurethral resection of the prostate   . GERD (gastroesophageal reflux disease)     status  post multiple dilatations for stricture  . Congenital deafness   . Sleep apnea     BiPAP and continuous oxygen  . Obesity 4/08    BMI= 40  . Anemia   . Alcohol use     ROS:   All systems reviewed and negative except as noted in the HPI.   Past Surgical History  Procedure Laterality Date  . Orchiectomy  1990    bilateral; ?neoplasm  . Repair hypospadias w/ urethroplasty    . Transurethral resection of prostate      for BPH  . Tonsillectomy and adenoidectomy    . A-v cardiac pacemaker insertion  1995  . Colonoscopy  2008  . Pacemaker insertion  05/2004; 10/04/2013    MDT dual chamber pacemaker; gen change 10/04/2013 (MDT ADDRL1)  . Permanent pacemaker generator change N/A 10/04/2013    Procedure: PERMANENT PACEMAKER GENERATOR CHANGE;  Surgeon: Evans Lance,  MD;  Location: [facility] CATH LAB;  Service: Cardiovascular;  Laterality: N/A;     Family History  Problem Relation Age of Onset  . COPD Mother   . Hypertension Mother   . Pneumonia Father   . Hypertension Other   . Heart disease Other      History   Social History  . Marital Status: Married    Spouse Name: N/A  . Number of Children: N/A  . Years of Education: N/A   Occupational History  . Not on file.   Social History Main Topics  . Smoking status: Never Smoker   . Smokeless tobacco: Never Used     Comment: tobacco use- no   . Alcohol Use: No  . Drug Use: No  . Sexual Activity: Not on file   Other Topics Concern  . Not on file   Social History Narrative   Part time.      BP 124/82 mmHg  Pulse 80  Ht 5\' 3"  (1.6 m)  Wt 201 lb (91.173 kg)  BMI 35.61 kg/m2  SpO2 90%  Physical Exam:  Well appearing middle aged man, NAD HEENT: Unremarkable Neck: 7 cm JVD, no thyromegally Back:  No CVA tenderness Lungs:  Clear with no wheezes, rales, or rhonchi HEART:  IRegular rate rhythm, no murmurs, no rubs, no clicks Abd:  soft, positive bowel sounds, no organomegally, no rebound, no guarding Ext:  2 plus pulses, no edema, no cyanosis, no clubbing Skin:  No rashes no nodules Neuro:  CN II through XII intact, motor grossly intact  PPM interogation - normal DDI PM function Assess/Plan:

## 2014-11-08 NOTE — Patient Instructions (Signed)
Your physician wants you to follow-up in: 1 year with Dr. Lovena Le. You will receive a reminder letter in the mail two months in advance. If you don't receive a letter, please call our office to schedule the follow-up appointment.  Remote monitoring is used to monitor your Pacemaker of ICD from home. This monitoring reduces the number of office visits required to check your device to one time per year. It allows [name] to keep an eye on the functioning of your device to ensure it is working properly. You are scheduled for a device check from home on 02/10/15. You may send your transmission at any time that day. If you have a wireless device, the transmission will be sent automatically. After your physician reviews your transmission, you will receive a postcard with your next transmission date.  Your physician recommends that you continue on your current medications as directed. Please refer to the Current Medication list given to you today. Thank you for choosing [facility]!

## 2014-11-11 ENCOUNTER — Encounter: Payer: Self-pay | Admitting: *Deleted

## 2015-01-29 ENCOUNTER — Ambulatory Visit (INDEPENDENT_AMBULATORY_CARE_PROVIDER_SITE_OTHER): Payer: Medicare Other | Admitting: Cardiology

## 2015-01-29 VITALS — BP 124/82 | HR 62 | Ht 62.0 in | Wt 199.6 lb

## 2015-01-29 DIAGNOSIS — I350 Nonrheumatic aortic (valve) stenosis: Secondary | ICD-10-CM | POA: Diagnosis not present

## 2015-01-29 DIAGNOSIS — I10 Essential (primary) hypertension: Secondary | ICD-10-CM | POA: Diagnosis not present

## 2015-01-29 DIAGNOSIS — I495 Sick sinus syndrome: Secondary | ICD-10-CM | POA: Diagnosis not present

## 2015-01-29 DIAGNOSIS — I4892 Unspecified atrial flutter: Secondary | ICD-10-CM

## 2015-01-29 MED ORDER — LOSARTAN POTASSIUM 25 MG PO TABS: 25.0000 mg | ORAL_TABLET | Freq: Every day | ORAL | Status: DC

## 2015-01-29 NOTE — Patient Instructions (Addendum)
Your physician wants you to follow-up in: 6 months with Dr. Harl Bowie. You will receive a reminder letter in the mail two months in advance. If you don't receive a letter, please call our office to schedule the follow-up appointment.  Start [place] 25 mg Daily   Your physician has requested that you have an echocardiogram. Echocardiography is a painless test that uses sound waves to create images of your heart. It provides your doctor with information about the size and shape of your heart and how well your heart's chambers and valves are working. This procedure takes approximately one hour. There are no restrictions for this procedure.   Your physician recommends that you return for lab work in: 2 weeks (02/12/15) (Bmet)  Thank you for choosing [facility]!

## 2015-01-29 NOTE — Progress Notes (Signed)
Clinical Summary Mr. Devery is a 54 y.o.male seen today for follow up of the following medical problems.   1. Atrial flutter  - denies any palpitations, he is on metoprolol for rate control - long term history of nose bleeds. We tried several anticoag agents, however all severely increased his nose bleeds requiring ER visits and nose packing. He states he has seen ENT before and told he has a bleeding vessel that there is nothing can be done for.  - still with occasional nose bleeds  2. Moderate aortic stenosis - denies any chest pain, SOB, lightheadedness, or syncope  3. Tachy-Brady syndrme - last check 10/2014 with normal function - denies any lightheadess or dizziness  4. HTN - compliant with meds - checks blood pressures regularly, typically 120s/70s  5. DM - followed by pcp Past Medical History  Diagnosis Date  . Chest pain 2007, 2009    cardiac cath > normal coronary arterie; nl left ventricular function  . Cardiac conduction disorder     status post pacemaker implantation in 1995 generator replaced 2005  . Aortic stenosis     mild  . Mitral regurgitation     insignificant  . HTN (hypertension)   . Hyperlipidemia     statin discontinued due to abn LFTs  . Syncope   . Obesity   . Pelvic mass     identified in 2009, stable 2010  . 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 Diabetes mellitus     +Insulin  . Benign hypertrophy of prostate     with urinary retention; repaired hypospadia; transurethral resection of the prostate   . GERD (gastroesophageal reflux disease)     status post multiple dilatations for stricture  . Congenital deafness   . Sleep apnea     BiPAP and continuous oxygen  . Obesity 4/08    BMI= 40  . Anemia   . Alcohol use      Allergies  Allergen Reactions  . Ciprofloxacin     Itching and red rash up arm when infusing after 3rd cipro dose for uti on 10/27/13  . Eliquis  [Apixaban] Other (See Comments)    bleeding  . Enalapril Other (See Comments)    Cough  . Metformin And Related     Severe diaarhea  . Penicillins Other (See Comments)    Reaction unspecified     Current Outpatient Prescriptions  Medication Sig Dispense Refill  . albuterol (VENTOLIN HFA) 108 (90 BASE) MCG/ACT inhaler Inhale 2 puffs into the lungs every 6 (six) hours as needed for wheezing.     Marland Kitchen aspirin 325 MG EC tablet Take 325 mg by mouth daily.    . budesonide-formoterol (SYMBICORT) 160-4.5 MCG/ACT inhaler Inhale 2 puffs into the lungs 2 (two) times daily.    . cetirizine (ZYRTEC) 10 MG tablet Take 10 mg by mouth daily.    . insulin lispro protamine-lispro (HUMALOG 75/25 MIX) (75-25) 100 UNIT/ML SUSP injection Inject 100 Units into the skin 2 (two) times daily with a meal. Inject S.Q. 80 units with breakfast and 90 units with supper    . levothyroxine (LEVOXYL) 50 MCG tablet Take 50 mcg by mouth daily.      . metoprolol tartrate (LOPRESSOR) 25 MG tablet TAKE 1 TABLET BY MOUTH TWICE DAILY 60 tablet 11  . Misc Natural Products (OSTEO BI-FLEX TRIPLE STRENGTH PO) Take 1 tablet by mouth daily.    Marland Kitchen  mometasone (NASONEX) 50 MCG/ACT nasal spray Place 2 sprays into the nose daily as needed (for congestion).     . Multiple Vitamins-Minerals (CENTRUM) tablet Take 1 tablet by mouth daily.      . nitroGLYCERIN (NITROSTAT) 0.4 MG SL tablet Place 0.4 mg under the tongue every 5 (five) minutes as needed for chest pain.    Marland Kitchen omeprazole (PRILOSEC) 40 MG capsule TAKE (1) CAPSULE BY MOUTH ONCE DAILY. 30 capsule 11  . Simethicone (GAS RELIEF 80 PO) Take 1 capsule by mouth daily as needed (gas).     . sodium chloride (OCEAN) 0.65 % SOLN nasal spray Place 1 spray into both nostrils as needed (nose bleeds).      No current facility-administered medications for this visit.     Past Surgical History  Procedure Laterality Date  . Orchiectomy  1990    bilateral; ?neoplasm  . Repair hypospadias w/  urethroplasty    . Transurethral resection of prostate      for BPH  . Tonsillectomy and adenoidectomy    . A-v cardiac pacemaker insertion  1995  . Colonoscopy  2008  . Pacemaker insertion  05/2004; 10/04/2013    MDT dual chamber pacemaker; gen change 10/04/2013 (MDT ADDRL1)  . Permanent pacemaker generator change N/A 10/04/2013    Procedure: PERMANENT PACEMAKER GENERATOR CHANGE;  Surgeon: Evans Lance, MD;  Location: [facility] CATH LAB;  Service: Cardiovascular;  Laterality: N/A;     Allergies  Allergen Reactions  . Ciprofloxacin     Itching and red rash up arm when infusing after 3rd cipro dose for uti on 10/27/13  . Eliquis [Apixaban] Other (See Comments)    bleeding  . Enalapril Other (See Comments)    Cough  . Metformin And Related     Severe diaarhea  . Penicillins Other (See Comments)    Reaction unspecified      Family History  Problem Relation Age of Onset  . COPD Mother   . Hypertension Mother   . Pneumonia Father   . Hypertension Other   . Heart disease Other      Social History Mr. Velardi reports that he has never smoked. He has never used smokeless tobacco. Mr. Mcclenathan reports that he does not drink alcohol.   Review of Systems CONSTITUTIONAL: No weight loss, fever, chills, weakness or fatigue.  HEENT: Eyes: No visual loss, blurred vision, double vision or yellow sclerae.No hearing loss, sneezing, congestion, runny nose or sore throat.  SKIN: No rash or itching.  CARDIOVASCULAR: per HPI RESPIRATORY: No shortness of breath, cough or sputum.  GASTROINTESTINAL: No anorexia, nausea, vomiting or diarrhea. No abdominal pain or blood.  GENITOURINARY: No burning on urination, no polyuria NEUROLOGICAL: No headache, dizziness, syncope, paralysis, ataxia, numbness or tingling in the extremities. No change in bowel or bladder control.  MUSCULOSKELETAL: No muscle, back pain, joint pain or stiffness.  LYMPHATICS: No enlarged nodes. No history of splenectomy.    PSYCHIATRIC: No history of depression or anxiety.  ENDOCRINOLOGIC: No reports of sweating, cold or heat intolerance. No polyuria or polydipsia.  Marland Kitchen   Physical Examination Filed Vitals:   01/29/15 1045  BP: 124/82  Pulse: 62   Filed Vitals:   01/29/15 1045  Height: 5\' 2"  (1.575 m)  Weight: 199 lb 9.6 oz (90.538 kg)    Gen: resting comfortably, no acute distress HEENT: no scleral icterus, pupils equal round and reactive, no palptable cervical adenopathy,  CV: RRR, 3/6 systolic murmur RUSB, no JVD Resp: Clear to auscultation bilaterally  GI: abdomen is soft, non-tender, non-distended, normal bowel sounds, no hepatosplenomegaly MSK: extremities are warm, no edema.  Skin: warm, no rash Neuro:  no focal deficits Psych: appropriate affect   Diagnostic Studies 02/2013 Echo limited evaluate AS: mild AS (mean grad 16   01/2013 Echo: difficult study, LV function mildly reduced ( no percentage given), possible moderate to severe AS (AVA .53) mean grad 27 mmHg.   07/30/13 Clinic EKG: sinus rhythm, 1st degree AV block, RBBB   Jan 2015 Echo  LVEF 55-60%, cannot eval diastolic function, mod AS (area 1, mean grad 20).    02/2014 Carotid US IMPRESSION: No evidence of internal carotid artery stenosis.  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     Assessment and Plan  1. Atrial flutter  - continue rate control Severe nose bleeds on several anticoag agents, will continue ASA  2. Aortic stenosis - has been over a year since last study, will repeat echo  4. Tachy-Brady syndrome - no current symptoms, normal pacer function on last check. Continue routine device checks  5. DM - management per pcp  6. HTN - at goal, continue current meds - start losartan 24m daily in setting of DM2 and HTN, he has known ACE-I cough. Check BMET in 2 weeks  F/u 6 months   Arnoldo Lenis, M.D.

## 2015-01-30 ENCOUNTER — Encounter: Payer: Self-pay | Admitting: Cardiology

## 2015-02-03 ENCOUNTER — Telehealth: Payer: Self-pay

## 2015-02-03 NOTE — Telephone Encounter (Signed)
error 

## 2015-02-10 ENCOUNTER — Telehealth: Payer: Self-pay | Admitting: Cardiology

## 2015-02-10 ENCOUNTER — Other Ambulatory Visit: Payer: Self-pay | Admitting: Internal Medicine

## 2015-02-10 ENCOUNTER — Ambulatory Visit (INDEPENDENT_AMBULATORY_CARE_PROVIDER_SITE_OTHER): Payer: Medicare Other | Admitting: *Deleted

## 2015-02-10 DIAGNOSIS — I495 Sick sinus syndrome: Secondary | ICD-10-CM | POA: Diagnosis not present

## 2015-02-10 LAB — CUP PACEART REMOTE DEVICE CHECK
Brady Statistic AS VP Percent: 0 %
Brady Statistic AS VS Percent: 100 %
Lead Channel Impedance Value: 294 Ohm
Lead Channel Pacing Threshold Amplitude: 0.5 V
Lead Channel Sensing Intrinsic Amplitude: 1.4 mV
Lead Channel Sensing Intrinsic Amplitude: 8 mV
Lead Channel Setting Pacing Amplitude: 2 V
Lead Channel Setting Pacing Amplitude: 2.5 V
Lead Channel Setting Pacing Pulse Width: 0.4 ms
Lead Channel Setting Sensing Sensitivity: 2.8 mV
MDC IDC MSMT BATTERY IMPEDANCE: 111 Ohm
MDC IDC MSMT BATTERY REMAINING LONGEVITY: 160 mo
MDC IDC MSMT BATTERY VOLTAGE: 2.8 V
MDC IDC MSMT LEADCHNL RV IMPEDANCE VALUE: 432 Ohm
MDC IDC MSMT LEADCHNL RV PACING THRESHOLD PULSEWIDTH: 0.4 ms
MDC IDC SESS DTM: 20160523153743
MDC IDC STAT BRADY AP VP PERCENT: 0 %
MDC IDC STAT BRADY AP VS PERCENT: 0 %

## 2015-02-10 NOTE — Telephone Encounter (Signed)
Confirmed remote transmission w/ pt wife.   

## 2015-02-10 NOTE — Progress Notes (Signed)
Remote pacemaker transmission.   

## 2015-02-12 ENCOUNTER — Ambulatory Visit (HOSPITAL_COMMUNITY)
Admission: RE | Admit: 2015-02-12 | Discharge: 2015-02-12 | Disposition: A | Discharge status: Home / Self Care | Payer: Medicare Other | Source: Ambulatory Visit | Attending: Cardiology | Admitting: Cardiology

## 2015-02-12 ENCOUNTER — Other Ambulatory Visit (HOSPITAL_COMMUNITY): Payer: Medicare Other

## 2015-02-12 DIAGNOSIS — I350 Nonrheumatic aortic (valve) stenosis: Secondary | ICD-10-CM | POA: Diagnosis present

## 2015-02-13 LAB — BASIC METABOLIC PANEL
BUN: 16 mg/dL (ref 6–23)
CHLORIDE: 98 meq/L (ref 96–112)
CO2: 29 mEq/L (ref 19–32)
Calcium: 9 mg/dL (ref 8.4–10.5)
Creat: 0.83 mg/dL (ref 0.50–1.35)
Glucose, Bld: 140 mg/dL — ABNORMAL HIGH (ref 70–99)
POTASSIUM: 4.7 meq/L (ref 3.5–5.3)
Sodium: 139 mEq/L (ref 135–145)

## 2015-02-19 ENCOUNTER — Encounter: Payer: Self-pay | Admitting: Cardiology

## 2015-02-26 ENCOUNTER — Encounter: Payer: Self-pay | Admitting: Internal Medicine

## 2015-05-12 ENCOUNTER — Ambulatory Visit (INDEPENDENT_AMBULATORY_CARE_PROVIDER_SITE_OTHER): Payer: Medicare Other | Admitting: *Deleted

## 2015-05-12 ENCOUNTER — Encounter: Payer: Self-pay | Admitting: Internal Medicine

## 2015-05-12 DIAGNOSIS — I495 Sick sinus syndrome: Secondary | ICD-10-CM | POA: Diagnosis not present

## 2015-05-13 ENCOUNTER — Emergency Department (HOSPITAL_COMMUNITY): Payer: Medicare Other

## 2015-05-13 ENCOUNTER — Encounter (HOSPITAL_COMMUNITY): Payer: Self-pay | Admitting: *Deleted

## 2015-05-13 ENCOUNTER — Emergency Department (HOSPITAL_COMMUNITY)
Admission: EM | Admit: 2015-05-13 | Discharge: 2015-05-13 | Disposition: A | Discharge status: Home / Self Care | Payer: Medicare Other | Attending: Emergency Medicine | Admitting: Emergency Medicine

## 2015-05-13 DIAGNOSIS — E119 Type 2 diabetes mellitus without complications: Secondary | ICD-10-CM | POA: Diagnosis not present

## 2015-05-13 DIAGNOSIS — Z87438 Personal history of other diseases of male genital organs: Secondary | ICD-10-CM | POA: Insufficient documentation

## 2015-05-13 DIAGNOSIS — Z862 Personal history of diseases of the blood and blood-forming organs and certain disorders involving the immune mechanism: Secondary | ICD-10-CM | POA: Insufficient documentation

## 2015-05-13 DIAGNOSIS — N390 Urinary tract infection, site not specified: Secondary | ICD-10-CM | POA: Diagnosis not present

## 2015-05-13 DIAGNOSIS — K5900 Constipation, unspecified: Secondary | ICD-10-CM

## 2015-05-13 DIAGNOSIS — R1033 Periumbilical pain: Secondary | ICD-10-CM | POA: Diagnosis present

## 2015-05-13 DIAGNOSIS — E669 Obesity, unspecified: Secondary | ICD-10-CM | POA: Diagnosis not present

## 2015-05-13 DIAGNOSIS — Z880 Allergy status to penicillin: Secondary | ICD-10-CM | POA: Insufficient documentation

## 2015-05-13 DIAGNOSIS — Z950 Presence of cardiac pacemaker: Secondary | ICD-10-CM | POA: Diagnosis not present

## 2015-05-13 DIAGNOSIS — H905 Unspecified sensorineural hearing loss: Secondary | ICD-10-CM | POA: Insufficient documentation

## 2015-05-13 DIAGNOSIS — E079 Disorder of thyroid, unspecified: Secondary | ICD-10-CM | POA: Diagnosis not present

## 2015-05-13 DIAGNOSIS — I10 Essential (primary) hypertension: Secondary | ICD-10-CM | POA: Diagnosis not present

## 2015-05-13 DIAGNOSIS — Z79899 Other long term (current) drug therapy: Secondary | ICD-10-CM | POA: Insufficient documentation

## 2015-05-13 DIAGNOSIS — R1110 Vomiting, unspecified: Secondary | ICD-10-CM | POA: Insufficient documentation

## 2015-05-13 DIAGNOSIS — Z7982 Long term (current) use of aspirin: Secondary | ICD-10-CM | POA: Diagnosis not present

## 2015-05-13 DIAGNOSIS — J449 Chronic obstructive pulmonary disease, unspecified: Secondary | ICD-10-CM | POA: Diagnosis not present

## 2015-05-13 DIAGNOSIS — K219 Gastro-esophageal reflux disease without esophagitis: Secondary | ICD-10-CM | POA: Insufficient documentation

## 2015-05-13 DIAGNOSIS — Z794 Long term (current) use of insulin: Secondary | ICD-10-CM | POA: Diagnosis not present

## 2015-05-13 HISTORY — DX: Chronic obstructive pulmonary disease, unspecified: J44.9

## 2015-05-13 HISTORY — DX: Disorder of thyroid, unspecified: E07.9

## 2015-05-13 HISTORY — DX: Edema, unspecified: R60.9

## 2015-05-13 HISTORY — DX: Unspecified atrial flutter: I48.92

## 2015-05-13 LAB — COMPREHENSIVE METABOLIC PANEL
ALT: 80 U/L — AB (ref 17–63)
AST: 89 U/L — AB (ref 15–41)
Albumin: 3.5 g/dL (ref 3.5–5.0)
Alkaline Phosphatase: 438 U/L — ABNORMAL HIGH (ref 38–126)
Anion gap: 9 (ref 5–15)
BILIRUBIN TOTAL: 2 mg/dL — AB (ref 0.3–1.2)
BUN: 21 mg/dL — AB (ref 6–20)
CO2: 28 mmol/L (ref 22–32)
CREATININE: 0.87 mg/dL (ref 0.61–1.24)
Calcium: 9 mg/dL (ref 8.9–10.3)
Chloride: 99 mmol/L — ABNORMAL LOW (ref 101–111)
GFR calc Af Amer: >60 mL/min (ref >60)
Glucose, Bld: 112 mg/dL — ABNORMAL HIGH (ref 65–99)
POTASSIUM: 3.8 mmol/L (ref 3.5–5.1)
Sodium: 136 mmol/L (ref 135–145)
TOTAL PROTEIN: 7.2 g/dL (ref 6.5–8.1)

## 2015-05-13 LAB — CBC WITH DIFFERENTIAL/PLATELET
BASOS PCT: 1 % (ref 0–1)
Basophils Absolute: 0.1 10*3/uL (ref 0.0–0.1)
EOS ABS: 0.1 10*3/uL (ref 0.0–0.7)
EOS PCT: 0 % (ref 0–5)
HEMATOCRIT: 49.8 % (ref 39.0–52.0)
Hemoglobin: 16.4 g/dL (ref 13.0–17.0)
Lymphocytes Relative: 14 % (ref 12–46)
Lymphs Abs: 1.7 10*3/uL (ref 0.7–4.0)
MCH: 31.3 pg (ref 26.0–34.0)
MCHC: 32.9 g/dL (ref 30.0–36.0)
MCV: 95 fL (ref 78.0–100.0)
MONO ABS: 1.1 10*3/uL — AB (ref 0.1–1.0)
MONOS PCT: 9 % (ref 3–12)
Neutro Abs: 9.3 10*3/uL — ABNORMAL HIGH (ref 1.7–7.7)
Neutrophils Relative %: 76 % (ref 43–77)
Platelets: 207 10*3/uL (ref 150–400)
RBC: 5.24 MIL/uL (ref 4.22–5.81)
RDW: 15.7 % — AB (ref 11.5–15.5)
WBC: 12.2 10*3/uL — ABNORMAL HIGH (ref 4.0–10.5)

## 2015-05-13 LAB — URINALYSIS, ROUTINE W REFLEX MICROSCOPIC
BILIRUBIN URINE: NEGATIVE
GLUCOSE, UA: NEGATIVE mg/dL
KETONES UR: NEGATIVE mg/dL
Nitrite: NEGATIVE
PH: 5 (ref 5.0–8.0)
Protein, ur: 30 mg/dL — AB
Specific Gravity, Urine: 1.01 (ref 1.005–1.030)
Urobilinogen, UA: 0.2 mg/dL (ref 0.0–1.0)

## 2015-05-13 LAB — URINE MICROSCOPIC-ADD ON

## 2015-05-13 LAB — LIPASE, BLOOD: LIPASE: 17 U/L — AB (ref 22–51)

## 2015-05-13 MED ORDER — SULFAMETHOXAZOLE-TRIMETHOPRIM 800-160 MG PO TABS: 1.0000 | ORAL_TABLET | Freq: Two times a day (BID) | ORAL | Status: AC

## 2015-05-13 MED ORDER — SULFAMETHOXAZOLE-TRIMETHOPRIM 800-160 MG PO TABS
1.0000 | ORAL_TABLET | Freq: Once | ORAL | Status: AC
  Administered 2015-05-13: 1 via ORAL
  Filled 2015-05-13: qty 1

## 2015-05-13 MED ORDER — PROMETHAZINE HCL 25 MG/ML IJ SOLN
12.5000 mg | Freq: Once | INTRAMUSCULAR | Status: AC
  Administered 2015-05-13: 12.5 mg via INTRAVENOUS
  Filled 2015-05-13: qty 1

## 2015-05-13 MED ORDER — MORPHINE SULFATE (PF) 4 MG/ML IV SOLN
4.0000 mg | INTRAVENOUS | Status: DC | PRN
  Administered 2015-05-13: 4 mg via INTRAVENOUS
  Filled 2015-05-13: qty 1

## 2015-05-13 MED ORDER — IOHEXOL 300 MG/ML  SOLN
100.0000 mL | Freq: Once | INTRAMUSCULAR | Status: AC | PRN
  Administered 2015-05-13: 100 mL via INTRAVENOUS

## 2015-05-13 MED ORDER — ONDANSETRON HCL 4 MG/2ML IJ SOLN
INTRAMUSCULAR | Status: DC
Start: 2015-05-13 — End: 2015-05-13
  Filled 2015-05-13: qty 2

## 2015-05-13 MED ORDER — ONDANSETRON HCL 4 MG/2ML IJ SOLN
4.0000 mg | Freq: Once | INTRAMUSCULAR | Status: AC
  Administered 2015-05-13: 4 mg via INTRAVENOUS
  Filled 2015-05-13: qty 2

## 2015-05-13 MED ORDER — POLYETHYLENE GLYCOL 3350 17 GM/SCOOP PO POWD: 17.0000 g | Freq: Every day | ORAL | Status: DC | PRN

## 2015-05-13 NOTE — Progress Notes (Signed)
Remote pacemaker transmission.   

## 2015-05-13 NOTE — ED Notes (Signed)
Patient placed on [place] 4L, O2 sat 93%.

## 2015-05-13 NOTE — ED Notes (Signed)
Pt comes in from home with abdominal pain and inability to make a BM. Pt states he made a small BM this morning. Pt has had several episodes of emesis this morning.

## 2015-05-13 NOTE — ED Notes (Signed)
Patient on 2L [place], sats 95%.

## 2015-05-13 NOTE — ED Notes (Signed)
Patient placed on non-rebreather, sats up to 90%.

## 2015-05-13 NOTE — ED Notes (Signed)
Sats at 88% on [place] at 6L.

## 2015-05-13 NOTE — Discharge Instructions (Signed)
Constipation Constipation is when a person:  Poops (has a bowel movement) less than 3 times a week.  Has a hard time pooping.  Has poop that is dry, hard, or bigger than normal. HOME CARE   Eat foods with a lot of fiber in them. This includes fruits, vegetables, beans, and whole grains such as brown rice.  Avoid fatty foods and foods with a lot of sugar. This includes french fries, hamburgers, cookies, candy, and soda.  If you are not getting enough fiber from food, take products with added fiber in them (supplements).  Drink enough fluid to keep your pee (urine) clear or pale yellow.  Exercise on a regular basis, or as told by your doctor.  Go to the restroom when you feel like you need to poop. Do not hold it.  Only take medicine as told by your doctor. Do not take medicines that help you poop (laxatives) without talking to your doctor first. GET HELP RIGHT AWAY IF:   You have bright red blood in your poop (stool).  Your constipation lasts more than 4 days or gets worse.  You have belly (abdominal) or butt (rectal) pain.  You have thin poop (as thin as a pencil).  You lose weight, and it cannot be explained. MAKE SURE YOU:   Understand these instructions.  Will watch your condition.  Will get help right away if you are not doing well or get worse. Document Released: 02/23/2008 Document Revised: 09/11/2013 Document Reviewed: 06/18/2013 [facility] Patient Information 2015 [place], [place]. This information is not intended to replace advice given to you by your health care provider. Make sure you discuss any questions you have with your health care provider.  Urinary Tract Infection Urinary tract infections (UTIs) can develop anywhere along your urinary tract. Your urinary tract is your body's drainage system for removing wastes and extra water. Your urinary tract includes two kidneys, two ureters, a bladder, and a urethra. Your kidneys are a pair of bean-shaped organs. Each  kidney is about the size of your fist. They are located below your ribs, one on each side of your spine. CAUSES Infections are caused by microbes, which are microscopic organisms, including fungi, viruses, and bacteria. These organisms are so small that they can only be seen through a microscope. Bacteria are the microbes that most commonly cause UTIs. SYMPTOMS  Symptoms of UTIs may vary by age and gender of the patient and by the location of the infection. Symptoms in young women typically include a frequent and intense urge to urinate and a painful, burning feeling in the bladder or urethra during urination. Older women and men are more likely to be tired, shaky, and weak and have muscle aches and abdominal pain. A fever may mean the infection is in your kidneys. Other symptoms of a kidney infection include pain in your back or sides below the ribs, nausea, and vomiting. DIAGNOSIS To diagnose a UTI, your caregiver will ask you about your symptoms. Your caregiver also will ask to provide a urine sample. The urine sample will be tested for bacteria and white blood cells. White blood cells are made by your body to help fight infection. TREATMENT  Typically, UTIs can be treated with medication. Because most UTIs are caused by a bacterial infection, they usually can be treated with the use of antibiotics. The choice of antibiotic and length of treatment depend on your symptoms and the type of bacteria causing your infection. HOME CARE INSTRUCTIONS  If you were prescribed  antibiotics, take them exactly as your caregiver instructs you. Finish the medication even if you feel better after you have only taken some of the medication.  Drink enough water and fluids to keep your urine clear or pale yellow.  Avoid caffeine, tea, and carbonated beverages. They tend to irritate your bladder.  Empty your bladder often. Avoid holding urine for long periods of time.  Empty your bladder before and after sexual  intercourse.  After a bowel movement, women should cleanse from front to back. Use each tissue only once. SEEK MEDICAL CARE IF:   You have back pain.  You develop a fever.  Your symptoms do not begin to resolve within 3 days. SEEK IMMEDIATE MEDICAL CARE IF:   You have severe back pain or lower abdominal pain.  You develop chills.  You have nausea or vomiting.  You have continued burning or discomfort with urination. MAKE SURE YOU:   Understand these instructions.  Will watch your condition.  Will get help right away if you are not doing well or get worse. Document Released: 06/16/2005 Document Revised: 03/07/2012 Document Reviewed: 10/15/2011 [facility] Patient Information 2015 [place], [place]. This information is not intended to replace advice given to you by your health care provider. Make sure you discuss any questions you have with your health care provider.

## 2015-05-13 NOTE — ED Notes (Signed)
Patient given urinal to collect sample

## 2015-05-13 NOTE — ED Notes (Signed)
Pt vomiting up CT contrast.

## 2015-05-13 NOTE — ED Notes (Signed)
Patient 02 sat 84% on room air, [place] applied at 2L. Per wife patient has COPD and wears supplemental O2 at home.

## 2015-05-13 NOTE — ED Provider Notes (Signed)
CSN: [id number]     Arrival date & time 05/13/15  1137 History   This chart was scribed for No att. providers found by Terressa Koyanagi, ED Scribe. This patient was seen in room APA12/APA12 and the patient's care was started at 12:16 PM.   No chief complaint on file.  The history is provided by the patient and a relative. No language interpreter was used.   PCP: Petra Kuba, MD HPI Comments: [name] is a 54 y.o. male, accompanied by his daughter, with PMHx noted below, who presents to the Emergency Department complaining of constipation with associated 6/54, periumbilical abd pain onset a couple of days ago and vomiting onset this morning. Relative specifies that pt had a hard, small BM this morning, however, pt remains constipated. Relative further reports pt tested positive for blood in his stool in a fecal test 5 days ago-- but, denies any visible blood in stool. Relative reports pt took fiber pills at home without improvement. Pt's last colonoscopy was completed 3-4 years ago. Pt denies fever (pt's temp was 97.6 at home and 98.2 during exam) or chills.   Past Medical History  Diagnosis Date  . Chest pain 2007, 2009    cardiac cath > normal coronary arterie; nl left ventricular function  . Cardiac conduction disorder     status post pacemaker implantation in 1995 generator replaced 2005  . Aortic stenosis     mild  . Mitral regurgitation     insignificant  . HTN (hypertension)   . Hyperlipidemia     statin discontinued due to abn LFTs  . Syncope   . Obesity   . Pelvic mass     identified in 2009, stable 2010  . 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 Diabetes mellitus     +Insulin  . Benign hypertrophy of prostate     with urinary retention; repaired hypospadia; transurethral resection of the prostate   . GERD (gastroesophageal reflux disease)     status post multiple dilatations for stricture  .  Congenital deafness   . Sleep apnea     BiPAP and continuous oxygen  . Obesity 4/08    BMI= 40  . Anemia   . Alcohol use   . COPD (chronic obstructive pulmonary disease)   . Thyroid disease   . Edema   . Atrial flutter    Past Surgical History  Procedure Laterality Date  . Orchiectomy  1990    bilateral; ?neoplasm  . Repair hypospadias w/ urethroplasty    . Transurethral resection of prostate      for BPH  . Tonsillectomy and adenoidectomy    . A-v cardiac pacemaker insertion  1995  . Colonoscopy  2008  . Pacemaker insertion  05/2004; 10/04/2013    MDT dual chamber pacemaker; gen change 10/04/2013 (MDT ADDRL1)  . Permanent pacemaker generator change N/A 10/04/2013    Procedure: PERMANENT PACEMAKER GENERATOR CHANGE;  Surgeon: Evans Lance, MD;  Location: [facility] CATH LAB;  Service: Cardiovascular;  Laterality: N/A;   Family History  Problem Relation Age of Onset  . COPD Mother   . Hypertension Mother   . Pneumonia Father   . Hypertension Other   . Heart disease Other    Social History  Substance Use Topics  . Smoking status: Never Smoker   . Smokeless tobacco: Never Used     Comment: tobacco use- no   . Alcohol Use: No  Review of Systems  Constitutional: Negative for fever, chills, diaphoresis, appetite change and fatigue.  HENT: Negative for mouth sores, sore throat and trouble swallowing.   Eyes: Negative for visual disturbance.  Respiratory: Negative for cough, chest tightness, shortness of breath and wheezing.   Cardiovascular: Negative for chest pain.  Gastrointestinal: Positive for vomiting, abdominal pain and constipation. Negative for nausea, diarrhea and abdominal distention.  Endocrine: Negative for polydipsia, polyphagia and polyuria.  Genitourinary: Negative for dysuria, frequency and hematuria.  Musculoskeletal: Negative for gait problem.  Skin: Negative for color change, pallor and rash.  Neurological: Negative for dizziness, syncope, light-headedness and  headaches.  Hematological: Does not bruise/bleed easily.  Psychiatric/Behavioral: Negative for behavioral problems and confusion.  All other systems reviewed and are negative.  Allergies  Ciprofloxacin; Eliquis; Enalapril; Metformin and related; and Penicillins  Home Medications   Prior to Admission medications   Medication Sig Start Date End Date Taking? Authorizing Provider  albuterol (PROVENTIL) (2.5 MG/3ML) 0.083% nebulizer solution Take 2.5 mg by nebulization every 6 (six) hours as needed for wheezing or shortness of breath.   Yes Historical Provider, MD  albuterol (VENTOLIN HFA) 108 (90 BASE) MCG/ACT inhaler Inhale 2 puffs into the lungs every 6 (six) hours as needed for wheezing.    Yes Historical Provider, MD  aspirin 325 MG EC tablet Take 325 mg by mouth daily.   Yes Historical Provider, MD  budesonide-formoterol (SYMBICORT) 160-4.5 MCG/ACT inhaler Inhale 2 puffs into the lungs 2 (two) times daily.   Yes Historical Provider, MD  cetirizine (ZYRTEC) 10 MG tablet Take 10 mg by mouth daily.   Yes Historical Provider, MD  HUMALOG MIX 75/25 KWIKPEN (75-25) 100 UNIT/ML Kwikpen Inject 80 Units into the skin daily with breakfast. 90 units with supper 01/20/15  Yes Historical Provider, MD  levothyroxine (LEVOXYL) 50 MCG tablet Take 50 mcg by mouth daily.     Yes Historical Provider, MD  metoprolol tartrate (LOPRESSOR) 25 MG tablet TAKE 1 TABLET BY MOUTH TWICE DAILY 08/19/14  Yes Arnoldo Lenis, MD  Misc Natural Products (OSTEO BI-FLEX TRIPLE STRENGTH PO) Take 1 tablet by mouth daily.   Yes Historical Provider, MD  modafinil (PROVIGIL) 200 MG tablet  01/13/15  Yes Historical Provider, MD  Multiple Vitamins-Minerals (CENTRUM) tablet Take 1 tablet by mouth daily.     Yes Historical Provider, MD  nitroGLYCERIN (NITROSTAT) 0.4 MG SL tablet Place 0.4 mg under the tongue every 5 (five) minutes as needed for chest pain.   Yes Historical Provider, MD  Omega-3 Fatty Acids (FISH OIL) 1000 MG CAPS Take 1  capsule by mouth daily.   Yes Historical Provider, MD  omeprazole (PRILOSEC) 40 MG capsule TAKE (1) CAPSULE BY MOUTH ONCE DAILY. 08/19/14  Yes Arnoldo Lenis, MD  Psyllium (NATURAL FIBER PO) Take 6 capsules by mouth daily.   Yes Historical Provider, MD  Simethicone (GAS RELIEF 80 PO) Take 1 capsule by mouth daily as needed (gas).    Yes Historical Provider, MD  sodium chloride (OCEAN) 0.65 % SOLN nasal spray Place 1 spray into both nostrils as needed (nose bleeds).    Yes Historical Provider, MD  losartan (COZAAR) 25 MG tablet Take 1 tablet (25 mg total) by mouth daily. Patient not taking: Reported on 05/13/2015 01/29/15   Arnoldo Lenis, MD  polyethylene glycol powder (GLYCOLAX/MIRALAX) powder Take 17 g by mouth daily as needed for mild constipation. 05/13/15   Tanna Furry, MD  sulfamethoxazole-trimethoprim (BACTRIM DS,SEPTRA DS) 800-160 MG per tablet Take 1 tablet  by mouth 2 (two) times daily. 05/13/15 05/20/15  Tanna Furry, MD   Triage Vitals: BP 143/74 mmHg  Pulse 64  Temp(Src) 98.3 F (36.8 C) (Oral)  Resp 20  Ht 5\' 3"  (1.6 m)  Wt 172 lb (78.019 kg)  BMI 30.48 kg/m2  SpO2 99% Physical Exam  Constitutional: He is oriented to person, place, and time. He appears well-developed and well-nourished.  HENT:  Head: Normocephalic and atraumatic.  Right Ear: External ear normal.  Left Ear: External ear normal.  Eyes: Conjunctivae and EOM are normal. Pupils are equal, round, and reactive to light.  Neck: Normal range of motion and phonation normal. Neck supple.  Cardiovascular: Normal rate, regular rhythm and normal heart sounds.   Pulmonary/Chest: Effort normal and breath sounds normal. He exhibits no bony tenderness.  Abdominal: Soft. There is tenderness (Tenderness left abd diffused peri-umbilical. ). There is no rebound and no guarding.  Slightly hypoactive bowel sounds.   Musculoskeletal: Normal range of motion.  Neurological: He is alert and oriented to person, place, and time. No  cranial nerve deficit or sensory deficit. He exhibits normal muscle tone. Coordination normal.  Skin: Skin is warm, dry and intact.  Psychiatric: He has a normal mood and affect. His behavior is normal. Judgment and thought content normal.  Nursing note and vitals reviewed.   ED Course  Procedures (including critical care time) DIAGNOSTIC STUDIES: Oxygen Saturation is 99% on RA, nl by my interpretation.    COORDINATION OF CARE: 12:20 PM-Discussed treatment plan which includes CT scan, pain meds with pt and daughter at bedside and they agreed to plan.   Labs Review Labs Reviewed  COMPREHENSIVE METABOLIC PANEL - Abnormal; Notable for the following:    Chloride 99 (*)    Glucose, Bld 112 (*)    BUN 21 (*)    AST 89 (*)    ALT 80 (*)    Alkaline Phosphatase 438 (*)    Total Bilirubin 2.0 (*)    All other components within normal limits  LIPASE, BLOOD - Abnormal; Notable for the following:    Lipase 17 (*)    All other components within normal limits  URINALYSIS, ROUTINE W REFLEX MICROSCOPIC (NOT AT [facility]) - Abnormal; Notable for the following:    Hgb urine dipstick TRACE (*)    Protein, ur 30 (*)    Leukocytes, UA TRACE (*)    All other components within normal limits  CBC WITH DIFFERENTIAL/PLATELET - Abnormal; Notable for the following:    WBC 12.2 (*)    RDW 15.7 (*)    Neutro Abs 9.3 (*)    Monocytes Absolute 1.1 (*)    All other components within normal limits  URINE MICROSCOPIC-ADD ON - Abnormal; Notable for the following:    Bacteria, UA FEW (*)    All other components within normal limits  URINE CULTURE  CBC WITH DIFFERENTIAL/PLATELET      MDM   Final diagnoses:  UTI (lower urinary tract infection)  Constipation, unspecified constipation type     UTI. Labs. No acute findings on CT. Interestingly, received a call for radiologist describing what he felt was consistent with her "uterus". This may explain the previous "stalk based mass in his pelvis". 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  I discussed this finding with the patient and his wife. I did not feel this represents any acute finding for him. He is oxygenating at his baseline. 92% on his 2  L. Given by mouth Cipro. Prescription for the same. Relaxers constipation. Primary care follow-up.   I personally performed the services described in this documentation, which was scribed in my presence. The recorded information has been reviewed and is accurate.    Tanna Furry, MD 05/13/15 2140

## 2015-05-13 NOTE — ED Notes (Signed)
EDP notified of low O2 sats and vomiting.

## 2015-05-15 LAB — URINE CULTURE: Culture: 50000

## 2015-05-16 ENCOUNTER — Telehealth (HOSPITAL_BASED_OUTPATIENT_CLINIC_OR_DEPARTMENT_OTHER): Payer: Self-pay | Admitting: Emergency Medicine

## 2015-05-16 NOTE — Telephone Encounter (Signed)
Post ED Visit - Positive Culture Follow-up  Culture report reviewed by antimicrobial stewardship pharmacist: []  Wes Dulaney, Pharm.D., BCPS []  Heide Guile, Pharm.D., BCPS []  Alycia Rossetti, Pharm.D., BCPS []  [place], [place].D., BCPS, AAHIVP []  Legrand Como, Pharm.D., BCPS, AAHIVP []  Isac Sarna, Pharm.D., BCPS Dimitri Ped PharmD  Positive urine  Culture Yeast Treated with bactrim DS, organism sensitive to the same and no further patient follow-up is required at this time.  Hazle Nordmann 05/16/2015, 9:01 AM

## 2015-05-23 LAB — CUP PACEART REMOTE DEVICE CHECK
Battery Voltage: 2.8 V
Date Time Interrogation Session: 20160819122134
Lead Channel Pacing Threshold Pulse Width: 0.4 ms
Lead Channel Setting Sensing Sensitivity: 2.8 mV
MDC IDC MSMT BATTERY IMPEDANCE: 111 Ohm
MDC IDC MSMT BATTERY REMAINING LONGEVITY: 155 mo
MDC IDC MSMT LEADCHNL RA IMPEDANCE VALUE: 308 Ohm
MDC IDC MSMT LEADCHNL RA SENSING INTR AMPL: 1.4 mV
MDC IDC MSMT LEADCHNL RV IMPEDANCE VALUE: 457 Ohm
MDC IDC MSMT LEADCHNL RV PACING THRESHOLD AMPLITUDE: 0.375 V
MDC IDC MSMT LEADCHNL RV SENSING INTR AMPL: 8 mV
MDC IDC SET LEADCHNL RA PACING AMPLITUDE: 2.5 V
MDC IDC SET LEADCHNL RV PACING AMPLITUDE: 2 V
MDC IDC SET LEADCHNL RV PACING PULSEWIDTH: 0.4 ms
MDC IDC STAT BRADY AP VP PERCENT: 0 %
MDC IDC STAT BRADY AP VS PERCENT: 0 %
MDC IDC STAT BRADY AS VP PERCENT: 0 %
MDC IDC STAT BRADY AS VS PERCENT: 100 %

## 2015-06-02 ENCOUNTER — Ambulatory Visit (INDEPENDENT_AMBULATORY_CARE_PROVIDER_SITE_OTHER): Payer: Medicare Other | Admitting: Nurse Practitioner

## 2015-06-02 ENCOUNTER — Other Ambulatory Visit: Payer: Self-pay

## 2015-06-02 ENCOUNTER — Encounter: Payer: Self-pay | Admitting: Nurse Practitioner

## 2015-06-02 VITALS — BP 120/80 | HR 77 | Temp 97.1°F | Ht 63.0 in | Wt 195.6 lb

## 2015-06-02 DIAGNOSIS — R7989 Other specified abnormal findings of blood chemistry: Secondary | ICD-10-CM

## 2015-06-02 DIAGNOSIS — R195 Other fecal abnormalities: Secondary | ICD-10-CM

## 2015-06-02 DIAGNOSIS — K769 Liver disease, unspecified: Secondary | ICD-10-CM | POA: Diagnosis not present

## 2015-06-02 DIAGNOSIS — R945 Abnormal results of liver function studies: Secondary | ICD-10-CM

## 2015-06-02 MED ORDER — PEG 3350-KCL-NA BICARB-NACL 420 G PO SOLR: 4000.0000 mL | Freq: Once | ORAL | Status: DC

## 2015-06-02 NOTE — Progress Notes (Signed)
Primary Care Physician:  Petra Kuba, MD Primary Gastroenterologist:  Dr. Gala Romney  Chief Complaint  Patient presents with  . Blood In Stools    heme +  . Constipation    HPI:   54 year old male who was seen previously by [name] through approximately 2010 for anemia, heme positive stools, and elevated liver enzymes. Last visit to 20 03/09/2009 where his elevated liver function tests per day most likely related to nonalcoholic fatty liver disease with lovastatin as a possible contributing factor. Lovastatin had been subsequently discontinued and his liver function test were consistently improving. He is evaluated for viral hepatitis, autoimmune hepatitis which were both negative. He had a liver biopsy which was in nondiagnostic due to poor sample. Related to his iron deficiency anemia with heme positive stool he had an extensive GI workup including colonoscopy, endoscopy, and Givens capsule which were all negative. He was recommended to precipitate in crease daily exercise and improve diet. Last EGD and colonoscopy 02/09/2008 which showed small hiatal hernia, patchy antral erosions, tiny scattered narrow mouth sigmoid diverticulum. Duodenal biopsy negative stomach biopsies showed chronic gastritis with no H. pylori. Givens capsule done around that same time showed normal-appearing small bowel.  Today he states they did a stool study which showed positive heme stool card. He was advised to follow-up with GI. He was recently in the ER for UTI and constipation. CMP shoed elevated AST/ALT, Alk phos, and bili. Has a history of elevated LFTs as per above. He was having constipation and took miralax which helped resolved his symptoms. Currently had a bowel movement daily. Denies over hematochezia, melena. Denies abdominal pain. Denies N/V. PCP is Dr. Ronney Asters. Take PPI for GERD symptoms, denies breakthrough symptoms. Denies dysphagia. Denies chest pain, dyspnea, dizziness, lightheadedness, syncope, near  syncope. Denies any other upper or lower GI symptoms.  Anemia appears well controlled at this point, although he did have a dip in hgb to 9.9 in 2015.   Past Medical History  Diagnosis Date  . Chest pain 2007, 2009    cardiac cath > normal coronary arterie; nl left ventricular function  . Cardiac conduction disorder     status post pacemaker implantation in 1995 generator replaced 2005  . Aortic stenosis     mild  . Mitral regurgitation     insignificant  . HTN (hypertension)   . Hyperlipidemia     statin discontinued due to abn LFTs  . Syncope   . Obesity   . Pelvic mass     identified in 2009, stable 2010  . 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 Diabetes mellitus     +Insulin  . Benign hypertrophy of prostate     with urinary retention; repaired hypospadia; transurethral resection of the prostate   . GERD (gastroesophageal reflux disease)     status post multiple dilatations for stricture  . Congenital deafness   . Sleep apnea     BiPAP and continuous oxygen  . Obesity 4/08    BMI= 40  . Anemia   . Alcohol use   . COPD (chronic obstructive pulmonary disease)   . Thyroid disease   . Edema   . Atrial flutter     Past Surgical History  Procedure Laterality Date  . Orchiectomy  1990    bilateral; ?neoplasm  . Repair hypospadias w/ urethroplasty    . Transurethral resection of prostate      for BPH  .  Tonsillectomy and adenoidectomy    . A-v cardiac pacemaker insertion  1995  . Colonoscopy  2008  . Pacemaker insertion  05/2004; 10/04/2013    MDT dual chamber pacemaker; gen change 10/04/2013 (MDT ADDRL1)  . Permanent pacemaker generator change N/A 10/04/2013    Procedure: PERMANENT PACEMAKER GENERATOR CHANGE;  Surgeon: Evans Lance, MD;  Location: [facility] CATH LAB;  Service: Cardiovascular;  Laterality: N/A;    Current Outpatient Prescriptions  Medication Sig Dispense Refill  . albuterol (PROVENTIL) (2.5  MG/3ML) 0.083% nebulizer solution Take 2.5 mg by nebulization every 6 (six) hours as needed for wheezing or shortness of breath.    Marland Kitchen albuterol (VENTOLIN HFA) 108 (90 BASE) MCG/ACT inhaler Inhale 2 puffs into the lungs every 6 (six) hours as needed for wheezing.     Marland Kitchen aspirin 325 MG EC tablet Take 325 mg by mouth daily.    . budesonide-formoterol (SYMBICORT) 160-4.5 MCG/ACT inhaler Inhale 2 puffs into the lungs 2 (two) times daily.    . cetirizine (ZYRTEC) 10 MG tablet Take 10 mg by mouth daily.    Marland Kitchen HUMALOG MIX 75/25 KWIKPEN (75-25) 100 UNIT/ML Kwikpen Inject 80 Units into the skin daily with breakfast. 90 units with supper    . levothyroxine (LEVOXYL) 50 MCG tablet Take 50 mcg by mouth daily.      Marland Kitchen losartan (COZAAR) 25 MG tablet Take 1 tablet (25 mg total) by mouth daily. 90 tablet 3  . metoprolol tartrate (LOPRESSOR) 25 MG tablet TAKE 1 TABLET BY MOUTH TWICE DAILY 60 tablet 11  . Misc Natural Products (OSTEO BI-FLEX TRIPLE STRENGTH PO) Take 1 tablet by mouth daily.    . modafinil (PROVIGIL) 200 MG tablet     . Multiple Vitamins-Minerals (CENTRUM) tablet Take 1 tablet by mouth daily.      . mupirocin ointment (BACTROBAN) 2 % Place 1 application into the nose 3 (three) times daily.    . nitroGLYCERIN (NITROSTAT) 0.4 MG SL tablet Place 0.4 mg under the tongue every 5 (five) minutes as needed for chest pain.    . Omega-3 Fatty Acids (FISH OIL) 1000 MG CAPS Take 1 capsule by mouth daily.    Marland Kitchen omeprazole (PRILOSEC) 40 MG capsule TAKE (1) CAPSULE BY MOUTH ONCE DAILY. 30 capsule 11  . polyethylene glycol powder (GLYCOLAX/MIRALAX) powder Take 17 g by mouth daily as needed for mild constipation. 255 g 0  . sodium chloride (OCEAN) 0.65 % SOLN nasal spray Place 1 spray into both nostrils as needed (nose bleeds).     . Psyllium (NATURAL FIBER PO) Take 6 capsules by mouth daily.    . Simethicone (GAS RELIEF 80 PO) Take 1 capsule by mouth daily as needed (gas).      No current facility-administered  medications for this visit.    Allergies as of 06/02/2015 - Review Complete 06/02/2015  Allergen Reaction Noted  . Ciprofloxacin  10/27/2013  . Eliquis [apixaban] Other (See Comments) 03/18/2014  . Enalapril Other (See Comments) 04/15/2011  . Metformin and related  07/30/2013  . Penicillins Other (See Comments)     Family History  Problem Relation Age of Onset  . COPD Mother   . Hypertension Mother   . Pneumonia Father   . Hypertension Other   . Heart disease Other     Social History   Social History  . Marital Status: Married    Spouse Name: N/A  . Number of Children: N/A  . Years of Education: N/A   Occupational History  . Not  on file.   Social History Main Topics  . Smoking status: Never Smoker   . Smokeless tobacco: Never Used     Comment: tobacco use- no   . Alcohol Use: No  . Drug Use: No  . Sexual Activity: Not on file   Other Topics Concern  . Not on file   Social History Narrative   Part time.     Review of Systems: General: Negative for anorexia, weight loss, fever, chills, fatigue, weakness. Eyes: Negative for vision changes.  ENT: Negative for hoarseness, difficulty swallowing. CV: Negative for chest pain, angina, palpitations,peripheral edema.  Respiratory: Negative for dyspnea at rest, cough, sputum, wheezing.  GI: See history of present illness. Derm: Negative for rash or itching.  Endo: Negative for unusual weight change.  Heme: Negative for bruising or bleeding. Allergy: Negative for rash or hives.    Physical Exam: BP 120/80 mmHg  Pulse 77  Temp(Src) 97.1 F (36.2 C) (Oral)  Ht _0  (1.6 m)  Wt 195 lb 9.6 oz (88.724 kg)  BMI 34.66 kg/m2 General:   Alert and oriented. Pleasant and cooperative. Well-nourished and well-developed.  Head:  Normocephalic and atraumatic. Eyes:  Without icterus, sclera clear and conjunctiva pink.  Ears:  Normal auditory acuity. Cardiovascular:  S1, S2 present with systolic murmurs noted. Extremities  without clubbing or edema. Respiratory:  Clear to auscultation bilaterally. No wheezes, rales, or rhonchi. No distress.  Gastrointestinal:  +BS, soft, non-tender and non-distended. No HSM noted. No guarding or rebound. No masses appreciated.  Rectal:  Deferred  Skin:  Intact without significant lesions or rashes. Neurologic:  Alert and oriented x4;  grossly normal neurologically. Psych:  Alert and cooperative. Normal mood and affect. Heme/Lymph/Immune: No excessive bruising noted.    06/02/2015 11:13 AM

## 2015-06-02 NOTE — Patient Instructions (Addendum)
1. We will schedule your procedures for you. 2. Encourage low-fat diet and excised. 3. We will have the sign a consent so we can request your lab results from your primary care doctor. 4. Follow-up in 3 months.

## 2015-06-02 NOTE — Assessment & Plan Note (Signed)
Patient previously diagnosed with elevated LFTs which improved with discontinuing his statin medication. Last determined to be NASH versus chronic active hepatitis. CT abdomen and pelvis now shows micro-nodular contour of the liver with inability to exclude chronic hepatitis or cirrhosis. His recent labs show elevated LFTs including AST/ALT, alkaline phosphatase, and bilirubin. Both remained normal. We'll proceed with colonoscopy and endoscopy as noted below. Recommend low-fat diet and exercise. We will have him return in 3 months for further workup of his liver disease.

## 2015-06-02 NOTE — Assessment & Plan Note (Signed)
Patient has not been seen by our office number of years. He has a history of heme positive stool and has had an extensive evaluation including colonoscopy, endoscopy, and Givens capsule. This is all done approximately 2000 92,010. No overt etiology found. He was also noted to have fatty liver and recommended diet and exercise. Currently he is again tested positive for heme positive stool. His colonoscopy was approximately 7-8 years ago. His last CT abdomen and pelvis also showed micronodular contour of the liver with inability to exclude chronic hepatitis or cirrhosis. Recommend low-fat diet and exercise as liver disease with previously determined be likely fatty liver/nonalcoholic steatohepatitis. We requested heme positive stool results and the primary care provider. We will plan to proceed with a colonoscopy and endoscopy to further evaluate his anemia, heme positive stool, and to evaluate for varices given cirrhosis. We'll have him follow-up in 3 months for further workup related to his cirrhosis. Recent labs have demonstrated elevated LFTs including elevated AST/ALT, alkaline phosphatase, and bilirubin. Bili counts remained normal.  Proceed with TCS and EGD with Dr. Gala Romney in near future: the risks, benefits, and alternatives have been discussed with the patient in detail. The patient states understanding and desires to proceed.  The patient is not on any chronic pain medications, anxiolytics, antidepressants. He is not on any anticoagulants. Last procedures were completed with conscious sedation which are adequate for him at that time. Conscious sedation should again be adequate for his procedures.

## 2015-06-03 NOTE — Progress Notes (Signed)
cc'ed to pcp °

## 2015-06-04 ENCOUNTER — Encounter: Payer: Self-pay | Admitting: Cardiology

## 2015-06-23 ENCOUNTER — Encounter (HOSPITAL_COMMUNITY): Payer: Self-pay | Admitting: *Deleted

## 2015-06-23 ENCOUNTER — Ambulatory Visit (HOSPITAL_COMMUNITY)
Admission: RE | Admit: 2015-06-23 | Discharge: 2015-06-23 | Disposition: A | Discharge status: Home / Self Care | Payer: Medicare Other | Source: Ambulatory Visit | Attending: Internal Medicine | Admitting: Internal Medicine

## 2015-06-23 ENCOUNTER — Encounter (HOSPITAL_COMMUNITY)
Admission: RE | Disposition: A | Discharge status: Home / Self Care | Payer: Self-pay | Source: Ambulatory Visit | Attending: Internal Medicine

## 2015-06-23 DIAGNOSIS — E079 Disorder of thyroid, unspecified: Secondary | ICD-10-CM | POA: Diagnosis not present

## 2015-06-23 DIAGNOSIS — K3189 Other diseases of stomach and duodenum: Secondary | ICD-10-CM

## 2015-06-23 DIAGNOSIS — K319 Disease of stomach and duodenum, unspecified: Secondary | ICD-10-CM | POA: Insufficient documentation

## 2015-06-23 DIAGNOSIS — J449 Chronic obstructive pulmonary disease, unspecified: Secondary | ICD-10-CM | POA: Insufficient documentation

## 2015-06-23 DIAGNOSIS — E669 Obesity, unspecified: Secondary | ICD-10-CM | POA: Diagnosis not present

## 2015-06-23 DIAGNOSIS — K5730 Diverticulosis of large intestine without perforation or abscess without bleeding: Secondary | ICD-10-CM | POA: Diagnosis not present

## 2015-06-23 DIAGNOSIS — Z6841 Body Mass Index (BMI) 40.0 and over, adult: Secondary | ICD-10-CM | POA: Insufficient documentation

## 2015-06-23 DIAGNOSIS — Z79899 Other long term (current) drug therapy: Secondary | ICD-10-CM | POA: Insufficient documentation

## 2015-06-23 DIAGNOSIS — Z7982 Long term (current) use of aspirin: Secondary | ICD-10-CM | POA: Insufficient documentation

## 2015-06-23 DIAGNOSIS — I10 Essential (primary) hypertension: Secondary | ICD-10-CM | POA: Insufficient documentation

## 2015-06-23 DIAGNOSIS — E785 Hyperlipidemia, unspecified: Secondary | ICD-10-CM | POA: Insufficient documentation

## 2015-06-23 DIAGNOSIS — R195 Other fecal abnormalities: Secondary | ICD-10-CM | POA: Diagnosis not present

## 2015-06-23 DIAGNOSIS — K921 Melena: Secondary | ICD-10-CM | POA: Diagnosis present

## 2015-06-23 DIAGNOSIS — K766 Portal hypertension: Secondary | ICD-10-CM

## 2015-06-23 DIAGNOSIS — R7989 Other specified abnormal findings of blood chemistry: Secondary | ICD-10-CM

## 2015-06-23 DIAGNOSIS — D120 Benign neoplasm of cecum: Secondary | ICD-10-CM

## 2015-06-23 DIAGNOSIS — R945 Abnormal results of liver function studies: Secondary | ICD-10-CM

## 2015-06-23 DIAGNOSIS — K7460 Unspecified cirrhosis of liver: Secondary | ICD-10-CM | POA: Diagnosis not present

## 2015-06-23 HISTORY — PX: COLONOSCOPY: SHX5424

## 2015-06-23 HISTORY — PX: ESOPHAGOGASTRODUODENOSCOPY: SHX5428

## 2015-06-23 LAB — GLUCOSE, CAPILLARY
GLUCOSE-CAPILLARY: 60 mg/dL — AB (ref 65–99)
Glucose-Capillary: 185 mg/dL — ABNORMAL HIGH (ref 65–99)

## 2015-06-23 SURGERY — COLONOSCOPY
Anesthesia: Moderate Sedation

## 2015-06-23 MED ORDER — ONDANSETRON HCL 4 MG/2ML IJ SOLN
INTRAMUSCULAR | Status: AC
  Filled 2015-06-23: qty 2

## 2015-06-23 MED ORDER — MEPERIDINE HCL 100 MG/ML IJ SOLN
INTRAMUSCULAR | Status: DC | PRN
  Administered 2015-06-23: 50 mg via INTRAVENOUS

## 2015-06-23 MED ORDER — LIDOCAINE VISCOUS 2 % MT SOLN
OROMUCOSAL | Status: AC
  Filled 2015-06-23: qty 15

## 2015-06-23 MED ORDER — SIMETHICONE 40 MG/0.6ML PO SUSP
ORAL | Status: DC | PRN
  Administered 2015-06-23: 11:00:00

## 2015-06-23 MED ORDER — DEXTROSE 50 % IV SOLN: 25.0000 mL | Freq: Once | INTRAVENOUS | Status: DC

## 2015-06-23 MED ORDER — ONDANSETRON HCL 4 MG/2ML IJ SOLN
INTRAMUSCULAR | Status: DC | PRN
  Administered 2015-06-23: 4 mg via INTRAVENOUS

## 2015-06-23 MED ORDER — MIDAZOLAM HCL 5 MG/5ML IJ SOLN
INTRAMUSCULAR | Status: AC
  Filled 2015-06-23: qty 10

## 2015-06-23 MED ORDER — LIDOCAINE VISCOUS 2 % MT SOLN
OROMUCOSAL | Status: DC | PRN
  Administered 2015-06-23: 20 mL via OROMUCOSAL

## 2015-06-23 MED ORDER — MEPERIDINE HCL 100 MG/ML IJ SOLN
INTRAMUSCULAR | Status: AC
  Filled 2015-06-23: qty 2

## 2015-06-23 MED ORDER — DEXTROSE 50 % IV SOLN
INTRAVENOUS | Status: AC
  Administered 2015-06-23: 50 mL
  Filled 2015-06-23: qty 50

## 2015-06-23 MED ORDER — SODIUM CHLORIDE 0.9 % IV SOLN: INTRAVENOUS | Status: DC

## 2015-06-23 MED ORDER — MIDAZOLAM HCL 5 MG/5ML IJ SOLN
INTRAMUSCULAR | Status: DC | PRN
  Administered 2015-06-23: 1 mg via INTRAVENOUS
  Administered 2015-06-23: 2 mg via INTRAVENOUS
  Administered 2015-06-23: 1 mg via INTRAVENOUS

## 2015-06-23 MED ORDER — DEXTROSE-NACL 5-0.9 % IV SOLN
INTRAVENOUS | Status: DC
Start: 2015-06-23 — End: 2015-06-23
  Administered 2015-06-23: 11:00:00 via INTRAVENOUS

## 2015-06-23 NOTE — Op Note (Signed)
[facility] [address] [place], [address]   COLONOSCOPY PROCEDURE REPORT  PATIENT: [name], Nicholas  MR#: [id number] BIRTHDATE: [birth year] , 59  yrs. old GENDER: male ENDOSCOPIST: R.  Garfield Cornea, MD FACP [facility] REFERRED [id number] Shade Flood, M.D. PROCEDURE DATE:  2015/06/26 PROCEDURE:   Ileo-colonoscopy, diagnostic INDICATIONS:Hemoccult-positive stool. MEDICATIONS: Versed 4 mg IV and Demerol 50 mg IV in divided doses. Zofran 4 mg IV. ASA CLASS:       Class II  CONSENT: The risks, benefits, alternatives and imponderables including but not limited to bleeding, perforation as well as the possibility of a missed lesion have been reviewed.  The potential for biopsy, lesion removal, etc. have also been discussed. Questions have been answered.  All parties agreeable.  Please see the history and physical in the medical record for more information.  DESCRIPTION OF PROCEDURE:   After the risks benefits and alternatives of the procedure were thoroughly explained, informed consent was obtained.  The digital rectal exam revealed no abnormalities of the rectum.   The [id number] ([id number])  endoscope was introduced through the anus and advanced to the terminal ileum which was intubated for a short distance. No adverse events experienced.   The quality of the prep was adequate  The instrument was then slowly withdrawn as the colon was fully examined. Estimated blood loss is zero unless otherwise noted in this procedure report.      COLON FINDINGS: Somewhat congested rectal mucosa; otherwise mucosa appeared normal.  Scattered left-sided diverticula; colonic mucosa appeared somewhat congested diffusely; otherwise appeared normal except for a single 5 mm polyp in the base of the cecum. The distal 5 cm of terminal ileal mucosa also appeared normal. The above-mentioned polyp was cold snare removed.  Retroflexion was performed. .  Withdrawal time=10 minutes 0 seconds.   The scope was withdrawn and the procedure completed. COMPLICATIONS: There were no immediate complications. EBL 5 mL ENDOSCOPIC IMPRESSION: Portal colopathy. Colonic diverticulosis. Single colonic polyp?"removed as described above. I suspect the patient may lose a little blood from either his colon or his stomach from time to time given the likely presence of some  increase in portal pressures.   RECOMMENDATIONS: Follow-up pathology. See EGD report.  eSigned:  R. Garfield Cornea, MD Rosalita Chessman [facility] 2015-06-26 11:55 AM   cc:  CPT CODES: ICD CODES:  The ICD and CPT codes recommended by this software are interpretations from the data that the clinical staff has captured with the software.  The verification of the translation of this report to the ICD and CPT codes and modifiers is the sole responsibility of the health care institution and practicing physician where this report was generated.  [employer] will not be held responsible for the validity of the ICD and CPT codes included on this report.  AMA assumes no liability for data contained or not contained herein. CPT is a Design[employer], television/film set of the [employer].  PATIENT NAME:  [name], [name] MR#: [id number]

## 2015-06-23 NOTE — Interval H&P Note (Signed)
History and Physical Interval Note:  06/23/2015 11:16 AM  [name]  has presented today for surgery, with the diagnosis of heme positive stool, elevated LFT  The various methods of treatment have been discussed with the patient and family. After consideration of risks, benefits and other options for treatment, the patient has consented to  Procedure(s) with comments: COLONOSCOPY (N/A) - 1215  ESOPHAGOGASTRODUODENOSCOPY (EGD) (N/A) as a surgical intervention .  [name] history has been reviewed, patient examined, no change in status, stable for surgery.  I have reviewed the patient's chart and labs.  Questions were answered to the patient's satisfaction.     [name]  No change. EGD and colonoscopy per plan.  The risks, benefits, limitations, imponderables and alternatives regarding both EGD and colonoscopy have been reviewed with the patient. Questions have been answered. All parties agreeable.

## 2015-06-23 NOTE — Discharge Instructions (Signed)
EGD Discharge instructions Please read the instructions outlined below and refer to this sheet in the next few weeks. These discharge instructions provide you with general information on caring for yourself after you leave the hospital. Your doctor may also give you specific instructions. While your treatment has been planned according to the most current medical practices available, unavoidable complications occasionally occur. If you have any problems or questions after discharge, please call your doctor. ACTIVITY  You may resume your regular activity but move at a slower pace for the next 24 hours.   Take frequent rest periods for the next 24 hours.   Walking will help expel (get rid of) the air and reduce the bloated feeling in your abdomen.   No driving for 24 hours (because of the anesthesia (medicine) used during the test).   You may shower.   Do not sign any important legal documents or operate any machinery for 24 hours (because of the anesthesia used during the test).  NUTRITION  Drink plenty of fluids.   You may resume your normal diet.   Begin with a light meal and progress to your normal diet.   Avoid alcoholic beverages for 24 hours or as instructed by your caregiver.  MEDICATIONS  You may resume your normal medications unless your caregiver tells you otherwise.  WHAT YOU CAN EXPECT TODAY  You may experience abdominal discomfort such as a feeling of fullness or gas pains.  FOLLOW-UP  Your doctor will discuss the results of your test with you.  SEEK IMMEDIATE MEDICAL ATTENTION IF ANY OF THE FOLLOWING OCCUR:  Excessive nausea (feeling sick to your stomach) and/or vomiting.   Severe abdominal pain and distention (swelling).   Trouble swallowing.   Temperature over 101 F (37.8 C).   Rectal bleeding or vomiting of blood.   Colonoscopy Discharge Instructions  Read the instructions outlined below and refer to this sheet in the next few weeks. These  discharge instructions provide you with general information on caring for yourself after you leave the hospital. Your doctor may also give you specific instructions. While your treatment has been planned according to the most current medical practices available, unavoidable complications occasionally occur. If you have any problems or questions after discharge, call Dr. Gala Romney at [phone]. ACTIVITY  You may resume your regular activity, but move at a slower pace for the next 24 hours.   Take frequent rest periods for the next 24 hours.   Walking will help get rid of the air and reduce the bloated feeling in your belly (abdomen).   No driving for 24 hours (because of the medicine (anesthesia) used during the test).    Do not sign any important legal documents or operate any machinery for 24 hours (because of the anesthesia used during the test).  NUTRITION  Drink plenty of fluids.   You may resume your normal diet as instructed by your doctor.   Begin with a light meal and progress to your normal diet. Heavy or fried foods are harder to digest and may make you feel sick to your stomach (nauseated).   Avoid alcoholic beverages for 24 hours or as instructed.  MEDICATIONS  You may resume your normal medications unless your doctor tells you otherwise.  WHAT YOU CAN EXPECT TODAY  Some feelings of bloating in the abdomen.   Passage of more gas than usual.   Spotting of blood in your stool or on the toilet paper.  IF YOU HAD POLYPS REMOVED DURING THE COLONOSCOPY:  No aspirin products for 7 days or as instructed.   No alcohol for 7 days or as instructed.   Eat a soft diet for the next 24 hours.  FINDING OUT THE RESULTS OF YOUR TEST Not all test results are available during your visit. If your test results are not back during the visit, make an appointment with your caregiver to find out the results. Do not assume everything is normal if you have not heard from your caregiver or the  medical facility. It is important for you to follow up on all of your test results.  SEEK IMMEDIATE MEDICAL ATTENTION IF:  You have more than a spotting of blood in your stool.   Your belly is swollen (abdominal distention).   You are nauseated or vomiting.   You have a temperature over 101.   You have abdominal pain or discomfort that is severe or gets worse throughout the day.    Repeat EGD in 2 years  Colon polyp and diverticulosis information provided  Further recommendations to follow pending review of pathology report  Colon Polyps Polyps are lumps of extra tissue growing inside the body. Polyps can grow in the large intestine (colon). Most colon polyps are noncancerous (benign). However, some colon polyps can become cancerous over time. Polyps that are larger than a pea may be harmful. To be safe, caregivers remove and test all polyps. CAUSES  Polyps form when mutations in the genes cause your cells to grow and divide even though no more tissue is needed. RISK FACTORS There are a number of risk factors that can increase your chances of getting colon polyps. They include:  Being older than 50 years.  Family history of colon polyps or colon cancer.  Long-term colon diseases, such as colitis or Crohn disease.  Being overweight.  Smoking.  Being inactive.  Drinking too much alcohol. SYMPTOMS  Most small polyps do not cause symptoms. If symptoms are present, they may include:  Blood in the stool. The stool may look dark red or black.  Constipation or diarrhea that lasts longer than 1 week. DIAGNOSIS People often do not know they have polyps until their caregiver finds them during a regular checkup. Your caregiver can use 4 tests to check for polyps:  Digital rectal exam. The caregiver wears gloves and feels inside the rectum. This test would find polyps only in the rectum.  Barium enema. The caregiver puts a liquid called barium into your rectum before taking  X-rays of your colon. Barium makes your colon look white. Polyps are dark, so they are easy to see in the X-ray pictures.  Sigmoidoscopy. A thin, flexible tube (sigmoidoscope) is placed into your rectum. The sigmoidoscope has a light and tiny camera in it. The caregiver uses the sigmoidoscope to look at the last third of your colon.  Colonoscopy. This test is like sigmoidoscopy, but the caregiver looks at the entire colon. This is the most common method for finding and removing polyps. TREATMENT  Any polyps will be removed during a sigmoidoscopy or colonoscopy. The polyps are then tested for cancer. PREVENTION  To help lower your risk of getting more colon polyps:  Eat plenty of fruits and vegetables. Avoid eating fatty foods.  Do not smoke.  Avoid drinking alcohol.  Exercise every day.  Lose weight if recommended by your caregiver.  Eat plenty of calcium and folate. Foods that are rich in calcium include milk, cheese, and broccoli. Foods that are rich in folate include chickpeas, kidney beans,  and spinach. HOME CARE INSTRUCTIONS Keep all follow-up appointments as directed by your caregiver. You may need periodic exams to check for polyps. SEEK MEDICAL CARE IF: You notice bleeding during a bowel movement.  Diverticulosis Diverticulosis is the condition that develops when small pouches (diverticula) form in the wall of your colon. Your colon, or large intestine, is where water is absorbed and stool is formed. The pouches form when the inside layer of your colon pushes through weak spots in the outer layers of your colon. CAUSES  No one knows exactly what causes diverticulosis. RISK FACTORS Being older than 19. Your risk for this condition increases with age. Diverticulosis is rare in people younger than 40 years. By age 29, almost everyone has it. Eating a low-fiber diet. Being frequently constipated. Being overweight. Not getting enough exercise. Smoking. Taking over-the-counter  pain medicines, like aspirin and ibuprofen. SYMPTOMS  Most people with diverticulosis do not have symptoms. DIAGNOSIS  Because diverticulosis often has no symptoms, health care providers often discover the condition during an exam for other colon problems. In many cases, a health care provider will diagnose diverticulosis while using a flexible scope to examine the colon (colonoscopy). TREATMENT  If you have never developed an infection related to diverticulosis, you may not need treatment. If you have had an infection before, treatment may include: Eating more fruits, vegetables, and grains. Taking a fiber supplement. Taking a live bacteria supplement (probiotic). Taking medicine to relax your colon. HOME CARE INSTRUCTIONS  Drink at least 6-8 glasses of water each day to prevent constipation. Try not to strain when you have a bowel movement. Keep all follow-up appointments. If you have had an infection before: Increase the fiber in your diet as directed by your health care provider or dietitian. Take a dietary fiber supplement if your health care provider approves. Only take medicines as directed by your health care provider. SEEK MEDICAL CARE IF:  You have abdominal pain. You have bloating. You have cramps. You have not gone to the bathroom in 3 days. SEEK IMMEDIATE MEDICAL CARE IF:  Your pain gets worse. Yourbloating becomes very bad. You have a fever or chills, and your symptoms suddenly get worse. You begin vomiting. You have bowel movements that are bloody or black. MAKE SURE YOU: Understand these instructions. Will watch your condition. Will get help right away if you are not doing well or get worse.

## 2015-06-23 NOTE — Op Note (Signed)
[facility] [address] [place], [address]   ENDOSCOPY PROCEDURE REPORT  PATIENT: Nicholas, [name]  MR#: [id number] BIRTHDATE: Jul 26, 1961 , 4  yrs. old GENDER: male ENDOSCOPIST: R.  Garfield Cornea, MD FACP [facility] REFERRED BY:  Sena Hitch, M.D. PROCEDURE DATE:  07/19/2015 PROCEDURE:  EGD, diagnostic INDICATIONS:  Hemoccult-positive stool; screening for esophageal varices in a cirrhotic. MEDICATIONS: Versed 2 mg IV and Demerol 50 mg IV.  Zofran 4 mg IV. Xylocaine gel orally. ASA CLASS:      Class II  CONSENT: The risks, benefits, limitations, alternatives and imponderables have been discussed.  The potential for biopsy, esophogeal dilation, etc. have also been reviewed.  Questions have been answered.  All parties agreeable.  Please see the history and physical in the medical record for more information.  DESCRIPTION OF PROCEDURE: After the risks benefits and alternatives of the procedure were thoroughly explained, informed consent was obtained.  The [id number] ([id number]) endoscope was introduced through the mouth and advanced to the second portion of the duodenum , limited by Without limitations. The instrument was slowly withdrawn as the mucosa was fully examined. Estimated blood loss is zero unless otherwise noted in this procedure report.    Normal-appearing tubular esophagus.  No esophageal varices.  Stomach empty.  Mild snake skinning or fish scale appearance of the gastric mucosa.  No ulcer or infiltrating process.  No gastric varices. Patent pylorus.  Normal-appearing first and second portion of the duodenum.  Retroflexed views revealed as previously described. The scope was then withdrawn from the patient and the procedure completed.  COMPLICATIONS: There were no immediate complications.  ENDOSCOPIC IMPRESSION: Mild portable gastropathy. No esophageal varices  RECOMMENDATIONS: Repeat EGD in 2 years. See colonoscopy report.  REPEAT  EXAM:  eSigned:  R. Garfield Cornea, MD Rosalita Chessman [facility] 2015/07/19 11:35 AM    CC:  CPT CODES: ICD CODES:  The ICD and CPT codes recommended by this software are interpretations from the data that the clinical staff has captured with the software.  The verification of the translation of this report to the ICD and CPT codes and modifiers is the sole responsibility of the health care institution and practicing physician where this report was generated.  [employer] will not be held responsible for the validity of the ICD and CPT codes included on this report.  AMA assumes no liability for data contained or not contained herein. CPT is a [employer], television/film set of the [employer].  PATIENT NAME:  Nicholas, [name] MR#: [id number]

## 2015-06-23 NOTE — H&P (View-Only) (Signed)
Primary Care Physician:  Petra Kuba, MD Primary Gastroenterologist:  Dr. Gala Romney  Chief Complaint  Patient presents with  . Blood In Stools    heme +  . Constipation    HPI:   54 year old male who was seen previously by [name] through approximately 2010 for anemia, heme positive stools, and elevated liver enzymes. Last visit to 20 03/09/2009 where his elevated liver function tests per day most likely related to nonalcoholic fatty liver disease with lovastatin as a possible contributing factor. Lovastatin had been subsequently discontinued and his liver function test were consistently improving. He is evaluated for viral hepatitis, autoimmune hepatitis which were both negative. He had a liver biopsy which was in nondiagnostic due to poor sample. Related to his iron deficiency anemia with heme positive stool he had an extensive GI workup including colonoscopy, endoscopy, and Givens capsule which were all negative. He was recommended to precipitate in crease daily exercise and improve diet. Last EGD and colonoscopy 02/09/2008 which showed small hiatal hernia, patchy antral erosions, tiny scattered narrow mouth sigmoid diverticulum. Duodenal biopsy negative stomach biopsies showed chronic gastritis with no H. pylori. Givens capsule done around that same time showed normal-appearing small bowel.  Today he states they did a stool study which showed positive heme stool card. He was advised to follow-up with GI. He was recently in the ER for UTI and constipation. CMP shoed elevated AST/ALT, Alk phos, and bili. Has a history of elevated LFTs as per above. He was having constipation and took miralax which helped resolved his symptoms. Currently had a bowel movement daily. Denies over hematochezia, melena. Denies abdominal pain. Denies N/V. PCP is Dr. Ronney Asters. Take PPI for GERD symptoms, denies breakthrough symptoms. Denies dysphagia. Denies chest pain, dyspnea, dizziness, lightheadedness, syncope, near  syncope. Denies any other upper or lower GI symptoms.  Anemia appears well controlled at this point, although he did have a dip in hgb to 9.9 in 2015.   Past Medical History  Diagnosis Date  . Chest pain 2007, 2009    cardiac cath > normal coronary arterie; nl left ventricular function  . Cardiac conduction disorder     status post pacemaker implantation in 1995 generator replaced 2005  . Aortic stenosis     mild  . Mitral regurgitation     insignificant  . HTN (hypertension)   . Hyperlipidemia     statin discontinued due to abn LFTs  . Syncope   . Obesity   . Pelvic mass     identified in 2009, stable 2010  . 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 Diabetes mellitus     +Insulin  . Benign hypertrophy of prostate     with urinary retention; repaired hypospadia; transurethral resection of the prostate   . GERD (gastroesophageal reflux disease)     status post multiple dilatations for stricture  . Congenital deafness   . Sleep apnea     BiPAP and continuous oxygen  . Obesity 4/08    BMI= 40  . Anemia   . Alcohol use   . COPD (chronic obstructive pulmonary disease)   . Thyroid disease   . Edema   . Atrial flutter     Past Surgical History  Procedure Laterality Date  . Orchiectomy  1990    bilateral; ?neoplasm  . Repair hypospadias w/ urethroplasty    . Transurethral resection of prostate      for BPH  .  Tonsillectomy and adenoidectomy    . A-v cardiac pacemaker insertion  1995  . Colonoscopy  2008  . Pacemaker insertion  05/2004; 10/04/2013    MDT dual chamber pacemaker; gen change 10/04/2013 (MDT ADDRL1)  . Permanent pacemaker generator change N/A 10/04/2013    Procedure: PERMANENT PACEMAKER GENERATOR CHANGE;  Surgeon: Evans Lance, MD;  Location: [facility] CATH LAB;  Service: Cardiovascular;  Laterality: N/A;    Current Outpatient Prescriptions  Medication Sig Dispense Refill  . albuterol (PROVENTIL) (2.5  MG/3ML) 0.083% nebulizer solution Take 2.5 mg by nebulization every 6 (six) hours as needed for wheezing or shortness of breath.    Marland Kitchen albuterol (VENTOLIN HFA) 108 (90 BASE) MCG/ACT inhaler Inhale 2 puffs into the lungs every 6 (six) hours as needed for wheezing.     Marland Kitchen aspirin 325 MG EC tablet Take 325 mg by mouth daily.    . budesonide-formoterol (SYMBICORT) 160-4.5 MCG/ACT inhaler Inhale 2 puffs into the lungs 2 (two) times daily.    . cetirizine (ZYRTEC) 10 MG tablet Take 10 mg by mouth daily.    Marland Kitchen HUMALOG MIX 75/25 KWIKPEN (75-25) 100 UNIT/ML Kwikpen Inject 80 Units into the skin daily with breakfast. 90 units with supper    . levothyroxine (LEVOXYL) 50 MCG tablet Take 50 mcg by mouth daily.      Marland Kitchen losartan (COZAAR) 25 MG tablet Take 1 tablet (25 mg total) by mouth daily. 90 tablet 3  . metoprolol tartrate (LOPRESSOR) 25 MG tablet TAKE 1 TABLET BY MOUTH TWICE DAILY 60 tablet 11  . Misc Natural Products (OSTEO BI-FLEX TRIPLE STRENGTH PO) Take 1 tablet by mouth daily.    . modafinil (PROVIGIL) 200 MG tablet     . Multiple Vitamins-Minerals (CENTRUM) tablet Take 1 tablet by mouth daily.      . mupirocin ointment (BACTROBAN) 2 % Place 1 application into the nose 3 (three) times daily.    . nitroGLYCERIN (NITROSTAT) 0.4 MG SL tablet Place 0.4 mg under the tongue every 5 (five) minutes as needed for chest pain.    . Omega-3 Fatty Acids (FISH OIL) 1000 MG CAPS Take 1 capsule by mouth daily.    Marland Kitchen omeprazole (PRILOSEC) 40 MG capsule TAKE (1) CAPSULE BY MOUTH ONCE DAILY. 30 capsule 11  . polyethylene glycol powder (GLYCOLAX/MIRALAX) powder Take 17 g by mouth daily as needed for mild constipation. 255 g 0  . sodium chloride (OCEAN) 0.65 % SOLN nasal spray Place 1 spray into both nostrils as needed (nose bleeds).     . Psyllium (NATURAL FIBER PO) Take 6 capsules by mouth daily.    . Simethicone (GAS RELIEF 80 PO) Take 1 capsule by mouth daily as needed (gas).      No current facility-administered  medications for this visit.    Allergies as of 06/02/2015 - Review Complete 06/02/2015  Allergen Reaction Noted  . Ciprofloxacin  10/27/2013  . Eliquis [apixaban] Other (See Comments) 03/18/2014  . Enalapril Other (See Comments) 04/15/2011  . Metformin and related  07/30/2013  . Penicillins Other (See Comments)     Family History  Problem Relation Age of Onset  . COPD Mother   . Hypertension Mother   . Pneumonia Father   . Hypertension Other   . Heart disease Other     Social History   Social History  . Marital Status: Married    Spouse Name: N/A  . Number of Children: N/A  . Years of Education: N/A   Occupational History  . Not  on file.   Social History Main Topics  . Smoking status: Never Smoker   . Smokeless tobacco: Never Used     Comment: tobacco use- no   . Alcohol Use: No  . Drug Use: No  . Sexual Activity: Not on file   Other Topics Concern  . Not on file   Social History Narrative   Part time.     Review of Systems: General: Negative for anorexia, weight loss, fever, chills, fatigue, weakness. Eyes: Negative for vision changes.  ENT: Negative for hoarseness, difficulty swallowing. CV: Negative for chest pain, angina, palpitations,peripheral edema.  Respiratory: Negative for dyspnea at rest, cough, sputum, wheezing.  GI: See history of present illness. Derm: Negative for rash or itching.  Endo: Negative for unusual weight change.  Heme: Negative for bruising or bleeding. Allergy: Negative for rash or hives.    Physical Exam: BP 120/80 mmHg  Pulse 77  Temp(Src) 97.1 F (36.2 C) (Oral)  Ht _0  (1.6 m)  Wt 195 lb 9.6 oz (88.724 kg)  BMI 34.66 kg/m2 General:   Alert and oriented. Pleasant and cooperative. Well-nourished and well-developed.  Head:  Normocephalic and atraumatic. Eyes:  Without icterus, sclera clear and conjunctiva pink.  Ears:  Normal auditory acuity. Cardiovascular:  S1, S2 present with systolic murmurs noted. Extremities  without clubbing or edema. Respiratory:  Clear to auscultation bilaterally. No wheezes, rales, or rhonchi. No distress.  Gastrointestinal:  +BS, soft, non-tender and non-distended. No HSM noted. No guarding or rebound. No masses appreciated.  Rectal:  Deferred  Skin:  Intact without significant lesions or rashes. Neurologic:  Alert and oriented x4;  grossly normal neurologically. Psych:  Alert and cooperative. Normal mood and affect. Heme/Lymph/Immune: No excessive bruising noted.    06/02/2015 11:13 AM

## 2015-06-26 ENCOUNTER — Ambulatory Visit (INDEPENDENT_AMBULATORY_CARE_PROVIDER_SITE_OTHER): Payer: Medicare Other | Admitting: Adult Health

## 2015-06-26 ENCOUNTER — Encounter (HOSPITAL_COMMUNITY): Payer: Self-pay | Admitting: Internal Medicine

## 2015-06-26 VITALS — BP 128/64 | HR 91 | Ht 63.5 in | Wt 194.4 lb

## 2015-06-26 DIAGNOSIS — I350 Nonrheumatic aortic (valve) stenosis: Secondary | ICD-10-CM

## 2015-06-26 DIAGNOSIS — I4821 Permanent atrial fibrillation: Secondary | ICD-10-CM

## 2015-06-26 DIAGNOSIS — I10 Essential (primary) hypertension: Secondary | ICD-10-CM

## 2015-06-26 DIAGNOSIS — I482 Chronic atrial fibrillation: Secondary | ICD-10-CM

## 2015-06-26 MED ORDER — NITROGLYCERIN 0.4 MG SL SUBL: 0.4000 mg | SUBLINGUAL_TABLET | SUBLINGUAL | Status: AC | PRN

## 2015-06-26 MED ORDER — METOPROLOL TARTRATE 25 MG PO TABS: 25.0000 mg | ORAL_TABLET | Freq: Two times a day (BID) | ORAL | Status: DC

## 2015-06-26 MED ORDER — LOSARTAN POTASSIUM 25 MG PO TABS: 25.0000 mg | ORAL_TABLET | Freq: Every day | ORAL | Status: DC

## 2015-06-26 NOTE — Progress Notes (Signed)
Cardiology Office Note   Date:  06/26/2015   ID:  [name], [name] 31-Mar-1961, MRN [id number]  PCP:  Petra Kuba, MD  Cardiologist: Cloria Spring, NP   Chief Complaint  Patient presents with  . Aortic Stenosis  . Atrial Fibrillation      History of Present Illness: [name] is a 54 y.o. male who presents for ongoing assessment and management of atrial flutter, CHADS VASC Score of 3, not on anticoagulation therapy, do to frequent epistaxis, and ER visits as a result.  Remains on aspirin. Moderate aortic stenosis, tachybradycardia syndrome,status post [employer] PPM, hypertension, with history of diabetes.  She was last seen by Dr. Harl Bowie.  In May  2016, and by Dr. Lovena Le, in February 2016.  The patient was seen by GI and had a colonoscopy on 06/23/2015 to anemia and Hemoccult-positive stools.this revealed portal colopathy, colonic diverticulosis, single colonic polyp, removed.  The patient is very hard of hearing, and is of low intellect, his wife is with him who helps to explain his current status.  The patient denies any chest pain or dyspnea on exertion.  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  Past Medical History  Diagnosis Date  . Chest pain 2007, 2009    cardiac cath > normal coronary arterie; nl left ventricular function  . Cardiac conduction disorder     status post pacemaker implantation in 1995 generator replaced 2005  . Aortic stenosis     mild  . Mitral regurgitation     insignificant  . HTN (hypertension)   . Hyperlipidemia     statin discontinued due to abn LFTs  . Syncope   . Obesity   . Pelvic mass     identified in 2009, stable 2010  . 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 Diabetes mellitus     +Insulin  . Benign hypertrophy of prostate     with urinary retention; repaired hypospadia; transurethral resection of the  prostate   . GERD (gastroesophageal reflux disease)     status post multiple dilatations for stricture  . Congenital deafness   . Sleep apnea     BiPAP and continuous oxygen  . Obesity 4/08    BMI= 40  . Anemia   . Alcohol use ([place])   . COPD (chronic obstructive pulmonary disease) ([place])   . Thyroid disease   . Edema   . Atrial flutter [facility])     Past Surgical History  Procedure Laterality Date  . Orchiectomy  1990    bilateral; ?neoplasm  . Repair hypospadias w/ urethroplasty    . Transurethral resection of prostate      for BPH  . Tonsillectomy and adenoidectomy    . A-v cardiac pacemaker insertion  1995  . Colonoscopy  2008  . Pacemaker insertion  05/2004; 10/04/2013    MDT dual chamber pacemaker; gen change 10/04/2013 (MDT ADDRL1)  . Permanent pacemaker generator change N/A 10/04/2013    Procedure: PERMANENT PACEMAKER GENERATOR CHANGE;  Surgeon: Evans Lance, MD;  Location: [facility] CATH LAB;  Service: Cardiovascular;  Laterality: N/A;  . Colonoscopy N/A 06/23/2015    Procedure: COLONOSCOPY;  Surgeon: Daneil Dolin, MD;  Location: [facility] ENDO SUITE;  Service: Endoscopy;  Laterality: N/A;  1215   . Esophagogastroduodenoscopy N/A 06/23/2015    Procedure: ESOPHAGOGASTRODUODENOSCOPY (EGD);  Surgeon: Daneil Dolin, MD;  Location:  [facility] ENDO SUITE;  Service: Endoscopy;  Laterality: N/A;     Current Outpatient Prescriptions  Medication Sig Dispense Refill  . albuterol (PROVENTIL) (2.5 MG/3ML) 0.083% nebulizer solution Take 2.5 mg by nebulization every 6 (six) hours as needed for wheezing or shortness of breath.    Marland Kitchen albuterol (VENTOLIN HFA) 108 (90 BASE) MCG/ACT inhaler Inhale 2 puffs into the lungs every 6 (six) hours as needed for wheezing.     Marland Kitchen aspirin 325 MG EC tablet Take 325 mg by mouth daily.    . budesonide-formoterol (SYMBICORT) 160-4.5 MCG/ACT inhaler Inhale 2 puffs into the lungs 2 (two) times daily.    . cetirizine (ZYRTEC) 10 MG tablet Take 10 mg by mouth daily.    Marland Kitchen HUMALOG  MIX 75/25 KWIKPEN (75-25) 100 UNIT/ML Kwikpen Inject 80 Units into the skin daily with breakfast. 90 units with supper    . levothyroxine (LEVOXYL) 50 MCG tablet Take 50 mcg by mouth daily.      Marland Kitchen losartan (COZAAR) 25 MG tablet Take 1 tablet (25 mg total) by mouth daily. 90 tablet 3  . metoprolol tartrate (LOPRESSOR) 25 MG tablet Take 1 tablet (25 mg total) by mouth 2 (two) times daily. 60 tablet 11  . Misc Natural Products (OSTEO BI-FLEX TRIPLE STRENGTH PO) Take 1 tablet by mouth daily.    . modafinil (PROVIGIL) 200 MG tablet Take 200 mg by mouth daily.     . Multiple Vitamins-Minerals (CENTRUM) tablet Take 1 tablet by mouth daily.      . mupirocin ointment (BACTROBAN) 2 % Place 1 application into the nose 3 (three) times daily.    . nitroGLYCERIN (NITROSTAT) 0.4 MG SL tablet Place 1 tablet (0.4 mg total) under the tongue every 5 (five) minutes as needed for chest pain. 25 tablet 3  . Omega-3 Fatty Acids (FISH OIL) 1000 MG CAPS Take 1 capsule by mouth daily.    Marland Kitchen omeprazole (PRILOSEC) 40 MG capsule TAKE (1) CAPSULE BY MOUTH ONCE DAILY. 30 capsule 11  . polyethylene glycol powder (GLYCOLAX/MIRALAX) powder Take 17 g by mouth daily as needed for mild constipation. 255 g 0  . polyethylene glycol-electrolytes (NULYTELY/GOLYTELY) 420 G solution Take 4,000 mLs by mouth once. 4000 mL 0  . Psyllium (NATURAL FIBER PO) Take 6 capsules by mouth daily.    . Simethicone (GAS RELIEF 80 PO) Take 1 capsule by mouth daily as needed (gas).     . sodium chloride (OCEAN) 0.65 % SOLN nasal spray Place 1 spray into both nostrils as needed (nose bleeds).      No current facility-administered medications for this visit.    Allergies:   Ciprofloxacin; Eliquis; Enalapril; Metformin and related; and Penicillins    Social History:  The patient  reports that he has never smoked. He has never used smokeless tobacco. He reports that he does not drink alcohol or use illicit drugs.   Family History:  [name] family  history includes COPD in his mother; Heart disease in his other; Hypertension in his mother and other; Pneumonia in his father.    ROS: All other systems are reviewed and negative. Unless otherwise mentioned in H&P    PHYSICAL EXAM: VS:  BP 128/64 mmHg  Pulse 91  Ht 5' 3.5" (1.613 m)  Wt 194 lb 6.4 oz (88.179 kg)  BMI 33.89 kg/m2  SpO2 98% , BMI Body mass index is 33.89 kg/(m^2). GEN: Well nourished, well developed, in no acute distress HEENT: normal Neck: no JVD, carotid bruits, or masses Cardiac:  IRRR;  Harsh holosystolic aortic valve murmur, preserved S2, rubs, or gallops,no edema  Respiratory:  clear to auscultation bilaterally, normal work of breathing GI: soft, nontender, nondistended, + BS MS: no deformity or atrophy Skin: warm and dry, no rash Neuro:  Strength and sensation are intact. Very hard of hearing Psych: euthymic mood, full affect   EKG: The ekg ordered today demonstrates atrial fibrillation, rate of 74 beats per minute   Recent Labs: 05/13/2015: ALT 80*; BUN 21*; Creatinine, Ser 0.87; Hemoglobin 16.4; Platelets 207; Potassium 3.8; Sodium 136    Lipid Panel    Component Value Date/Time   CHOL 141 05/19/2010 2137   TRIG 179* 05/19/2010 2137   HDL 33* 05/19/2010 2137   CHOLHDL 4.3 Ratio 05/19/2010 2137   VLDL 36 05/19/2010 2137   LDLCALC 72 05/19/2010 2137      Wt Readings from Last 3 Encounters:  06/26/15 194 lb 6.4 oz (88.179 kg)  06/23/15 195 lb (88.451 kg)  06/02/15 195 lb 9.6 oz (88.724 kg)      Other studies Reviewed: Additional studies/ records that were reviewed today include: echocardiogram Review of the above records demonstrates: severe aortic valve stenosis, mean gradient (S): 31 mm mercury.  Peak gradient (S.)54 mmHg.  VTI ratio LVOT 2 aortic valve 0.22. Part Dr.Branch's note dated 02/12/2015, aortic valve is gotten somewhat stiffer since last check, now in moderate to severe range.  Continue to monitor.   ASSESSMENT AND PLAN:  1.  Aortic valve stenosis: he is denying any dyspnea on exertion or chest pain.  We will need to recheck his echocardiogram in 3 months.  I advised him to please let us know should he be having any symptoms, which will include dizziness, near syncope, chest pain, or dyspnea on exertion.  We will need to make recommendations concerning TAVR versus replacement. 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 I am not certain that he understands the complexity of his current valvular status.  We will continue to monitor him closely for symptoms.  2. Atrial fibrillation: heart rate is currently well-controlled.  Initial heart rate was found to be elevated, but I rechecked it and clinic, and it was found to be 78 beats per minute.  He will continue on metoprolol 25 mg twice a day.  He is not a candidate for anticoagulation due to 2 frequent nosebleeds.  In ER visits as a result.  He will continue on aspirin 325 mg daily.  3. Hypertension:blood pressure is currently well controlled on metoprolol and losartan 25 mg daily.  No changes currently in his medication regimen.  Current medicines are reviewed at length with the patient today.    Labs/ tests ordered today include: None  Orders Placed This Encounter  Procedures  . EKG 12-Lead     Disposition:   FU with  3 months Signed, Jory Sims, NP  06/26/2015 4:02 PM    [facility] [address] [place], [place] [address] Phone: [phone]; Fax: [phone]

## 2015-06-26 NOTE — Progress Notes (Deleted)
Name: [name]    DOB: 08-31-1961  Age: 54 y.o.  MR#: [id number]       PCP:  Petra Kuba, MD      Insurance: Payor: [employer] MEDICARE / Plan: [facility] MEDICARE / Product Type: *No Product type* /   CC:   No chief complaint on file.   VS Filed Vitals:   06/26/15 1457  BP: 128/64  Pulse: 91  Height: 5' 3.5" (1.613 m)  Weight: 194 lb 6.4 oz (88.179 kg)  SpO2: 98%    Weights Current Weight  06/26/15 194 lb 6.4 oz (88.179 kg)  06/23/15 195 lb (88.451 kg)  06/02/15 195 lb 9.6 oz (88.724 kg)    Blood Pressure  BP Readings from Last 3 Encounters:  06/26/15 128/64  06/23/15 117/82  06/02/15 120/80     Admit date:  (Not on file) Last encounter with RMR:  Visit date not found   Allergy Ciprofloxacin; Eliquis; Enalapril; Metformin and related; and Penicillins  Current Outpatient Prescriptions  Medication Sig Dispense Refill  . albuterol (PROVENTIL) (2.5 MG/3ML) 0.083% nebulizer solution Take 2.5 mg by nebulization every 6 (six) hours as needed for wheezing or shortness of breath.    Marland Kitchen albuterol (VENTOLIN HFA) 108 (90 BASE) MCG/ACT inhaler Inhale 2 puffs into the lungs every 6 (six) hours as needed for wheezing.     Marland Kitchen aspirin 325 MG EC tablet Take 325 mg by mouth daily.    . budesonide-formoterol (SYMBICORT) 160-4.5 MCG/ACT inhaler Inhale 2 puffs into the lungs 2 (two) times daily.    . cetirizine (ZYRTEC) 10 MG tablet Take 10 mg by mouth daily.    Marland Kitchen HUMALOG MIX 75/25 KWIKPEN (75-25) 100 UNIT/ML Kwikpen Inject 80 Units into the skin daily with breakfast. 90 units with supper    . levothyroxine (LEVOXYL) 50 MCG tablet Take 50 mcg by mouth daily.      Marland Kitchen losartan (COZAAR) 25 MG tablet Take 1 tablet (25 mg total) by mouth daily. 90 tablet 3  . metoprolol tartrate (LOPRESSOR) 25 MG tablet TAKE 1 TABLET BY MOUTH TWICE DAILY 60 tablet 11  . Misc Natural Products (OSTEO BI-FLEX TRIPLE STRENGTH PO) Take 1 tablet by mouth daily.    . modafinil (PROVIGIL) 200 MG tablet Take  200 mg by mouth daily.     . Multiple Vitamins-Minerals (CENTRUM) tablet Take 1 tablet by mouth daily.      . mupirocin ointment (BACTROBAN) 2 % Place 1 application into the nose 3 (three) times daily.    . nitroGLYCERIN (NITROSTAT) 0.4 MG SL tablet Place 0.4 mg under the tongue every 5 (five) minutes as needed for chest pain.    . Omega-3 Fatty Acids (FISH OIL) 1000 MG CAPS Take 1 capsule by mouth daily.    Marland Kitchen omeprazole (PRILOSEC) 40 MG capsule TAKE (1) CAPSULE BY MOUTH ONCE DAILY. 30 capsule 11  . polyethylene glycol powder (GLYCOLAX/MIRALAX) powder Take 17 g by mouth daily as needed for mild constipation. 255 g 0  . polyethylene glycol-electrolytes (NULYTELY/GOLYTELY) 420 G solution Take 4,000 mLs by mouth once. 4000 mL 0  . Psyllium (NATURAL FIBER PO) Take 6 capsules by mouth daily.    . Simethicone (GAS RELIEF 80 PO) Take 1 capsule by mouth daily as needed (gas).     . sodium chloride (OCEAN) 0.65 % SOLN nasal spray Place 1 spray into both nostrils as needed (nose bleeds).      No current facility-administered medications for this visit.    Discontinued Meds:  There are no discontinued medications.  Patient Active Problem List   Diagnosis Date Noted  . Portal hypertensive gastropathy   . Heme + stool 06/02/2015  . Leukocytosis, unspecified 10/26/2013  . Hematuria 10/26/2013  . UTI (lower urinary tract infection) 10/25/2013  . HTN (hypertension)   . Atrial fibrillation ([place]) 10/19/2013  . Chronic diastolic heart failure ([place]) 09/19/2013  . Chest pain   . Aortic stenosis   . Hypertension   . Hyperlipidemia   . Pelvic mass   . Hepatic disease   . Benign hypertrophy of prostate   . Sleep apnea   . GASTROESOPHAGEAL REFLUX DISEASE 05/19/2010  . Cardiac pacemaker in situ 03/18/2010  . DIABETES MELLITUS, TYPE II, ON INSULIN 06/27/2008  . ANEMIA, MILD 06/27/2008  . DEAFNESS, CONGENITAL 06/27/2008  . CONDUCTION DISORDER OF THE HEART 06/27/2008    LABS    Component Value  Date/Time   NA 136 05/13/2015 1150   NA 139 02/12/2015 0809   NA 138 04/16/2014 1401   K 3.8 05/13/2015 1150   K 4.7 02/12/2015 0809   K 4.7 04/16/2014 1401   CL 99* 05/13/2015 1150   CL 98 02/12/2015 0809   CL 99 04/16/2014 1401   CO2 28 05/13/2015 1150   CO2 29 02/12/2015 0809   CO2 31 04/16/2014 1401   GLUCOSE 112* 05/13/2015 1150   GLUCOSE 140* 02/12/2015 0809   GLUCOSE 297* 04/16/2014 1401   BUN 21* 05/13/2015 1150   BUN 16 02/12/2015 0809   BUN 19 04/16/2014 1401   CREATININE 0.87 05/13/2015 1150   CREATININE 0.83 02/12/2015 0809   CREATININE 0.84 04/16/2014 1401   CREATININE 0.87 10/26/2013 0522   CALCIUM 9.0 05/13/2015 1150   CALCIUM 9.0 02/12/2015 0809   CALCIUM 9.0 04/16/2014 1401   GFRNONAA >60 05/13/2015 1150   GFRNONAA >90 04/16/2014 1401   GFRNONAA >90 10/26/2013 0522   GFRAA >60 05/13/2015 1150   GFRAA >90 04/16/2014 1401   GFRAA >90 10/26/2013 0522   CMP     Component Value Date/Time   NA 136 05/13/2015 1150   K 3.8 05/13/2015 1150   CL 99* 05/13/2015 1150   CO2 28 05/13/2015 1150   GLUCOSE 112* 05/13/2015 1150   BUN 21* 05/13/2015 1150   CREATININE 0.87 05/13/2015 1150   CREATININE 0.83 02/12/2015 0809   CALCIUM 9.0 05/13/2015 1150   PROT 7.2 05/13/2015 1150   ALBUMIN 3.5 05/13/2015 1150   AST 89* 05/13/2015 1150   ALT 80* 05/13/2015 1150   ALKPHOS 438* 05/13/2015 1150   BILITOT 2.0* 05/13/2015 1150   GFRNONAA >60 05/13/2015 1150   GFRAA >60 05/13/2015 1150       Component Value Date/Time   WBC 12.2* 05/13/2015 1150   WBC 9.6 10/03/2014 1933   WBC 16.0* 04/23/2014 1429   HGB 16.4 05/13/2015 1150   HGB 13.4 10/03/2014 1933   HGB 9.9* 04/23/2014 1429   HCT 49.8 05/13/2015 1150   HCT 42.2 10/03/2014 1933   HCT 31.3* 04/23/2014 1429   MCV 95.0 05/13/2015 1150   MCV 84.9 10/03/2014 1933   MCV 88.2 04/23/2014 1429    Lipid Panel     Component Value Date/Time   CHOL 141 05/19/2010 2137   TRIG 179* 05/19/2010 2137   HDL 33*  05/19/2010 2137   CHOLHDL 4.3 Ratio 05/19/2010 2137   VLDL 36 05/19/2010 2137   LDLCALC 72 05/19/2010 2137    ABG No results found for: PHART, PCO2ART, PO2ART, HCO3, TCO2, ACIDBASEDEF, O2SAT  Lab Results  Component Value Date   TSH 1.77 01/27/2010   BNP (last 3 results) No results for input(s): BNP in the last 8760 hours.  ProBNP (last 3 results) No results for input(s): PROBNP in the last 8760 hours.  Cardiac Panel (last 3 results) No results for input(s): CKTOTAL, CKMB, TROPONINI, RELINDX in the last 72 hours.  Iron/TIBC/Ferritin/ %Sat    Component Value Date/Time   IRON 34* 12/21/2007 0600   TIBC 482* 12/21/2007 0600   FERRITIN 29 12/21/2007 0600   IRONPCTSAT 7* 12/21/2007 0600     EKG Orders placed or performed in visit on 06/26/15  . EKG 12-Lead     Prior Assessment and Plan Problem List as of 06/26/2015      Cardiovascular and Mediastinum   CONDUCTION DISORDER OF THE HEART   Last Assessment & Plan 01/07/2012 Office Visit Written 01/07/2012  1:54 PM by Lendon Colonel, NP    He is doing well from cardiac standpoint. He is without complaint of chest pain or significant DOE.  I have advised him on low sodium diet. He is mentally slow but I think he understands that he should be more compliant with his diet.      Aortic stenosis   Last Assessment & Plan 06/01/2013 Office Visit Written 06/01/2013  4:08 PM by Evans Lance, MD    His aortic stenosis has been mild in the past. On exam today it sounds more moderate to severe. We'll keep an eye on this, and consider repeat echo.      Hypertension   Last Assessment & Plan 10/19/2013 Office Visit Written 10/19/2013  9:13 AM by Evans Lance, MD    His blood pressure is well controlled. He will continue his current medical therapy.      Chronic diastolic heart failure [facility])   Last Assessment & Plan 11/08/2014 Office Visit Written 11/08/2014 11:20 AM by Evans Lance, MD    His symptoms are class 2A. I have asked him to  maintain a low sodium diet. No change in meds.      Atrial fibrillation [facility])   Last Assessment & Plan 11/08/2014 Office Visit Written 11/08/2014 11:20 AM by Evans Lance, MD    He is now chronically in atrial fib. No change in meds. He is not on systemic anti-coagulation due to a risk of bleeding. He has had multiple bleeds on systemic anti-coagulation in the past.      HTN (hypertension)     Digestive   GASTROESOPHAGEAL REFLUX DISEASE   Hepatic disease   Last Assessment & Plan 06/02/2015 Office Visit Written 06/02/2015  3:17 PM by Carlis Stable, NP    Patient previously diagnosed with elevated LFTs which improved with discontinuing his statin medication. Last determined to be NASH versus chronic active hepatitis. CT abdomen and pelvis now shows micro-nodular contour of the liver with inability to exclude chronic hepatitis or cirrhosis. His recent labs show elevated LFTs including AST/ALT, alkaline phosphatase, and bilirubin. Both remained normal. We'll proceed with colonoscopy and endoscopy as noted below. Recommend low-fat diet and exercise. We will have him return in 3 months for further workup of his liver disease.      Portal hypertensive gastropathy     Endocrine   DIABETES MELLITUS, TYPE II, ON INSULIN   Last Assessment & Plan 04/13/2011 Office Visit Edited 04/19/2011  8:23 AM by Yehuda Savannah, MD    Diabetic control was excellent when last assessed approximately one year ago.  Risk of coronary disease is significant in the setting of diabetes and multiple additional contributing factors.  Negative coronary angiography 3 years ago it is somewhat reassuring, but does not protect against future development of disease.  Despite mild LFT abnormalities, if significant hyperlipidemia is present, I would be inclined to proceed with statin therapy.  A repeat lipid profile is pending.        Nervous and Auditory   DEAFNESS, CONGENITAL     Genitourinary   Benign hypertrophy of prostate    UTI (lower urinary tract infection)     Other   ANEMIA, MILD   Last Assessment & Plan 01/05/2013 Office Visit Written 01/05/2013  5:44 PM by Yehuda Savannah, MD    No recent laboratory study results; anemia and other basic medical problems are managed by patient's PCP.      Cardiac pacemaker in situ   Last Assessment & Plan 11/08/2014 Office Visit Written 11/08/2014 11:17 AM by Evans Lance, MD    His Medtronic dual chamber pacemaker is working normally. Will recheck in several months.      Chest pain   Hyperlipidemia   Last Assessment & Plan 11/08/2014 Office Visit Written 11/08/2014 11:21 AM by Evans Lance, MD    He is overweight. I have asked him to try and lose weight and maintain a low fat diet.      Pelvic mass   Sleep apnea   Leukocytosis, unspecified   Hematuria   Heme + stool   Last Assessment & Plan 06/02/2015 Office Visit Written 06/02/2015  3:15 PM by Carlis Stable, NP    Patient has not been seen by our office number of years. He has a history of heme positive stool and has had an extensive evaluation including colonoscopy, endoscopy, and Givens capsule. This is all done approximately 2000 92,010. No overt etiology found. He was also noted to have fatty liver and recommended diet and exercise. Currently he is again tested positive for heme positive stool. His colonoscopy was approximately 7-8 years ago. His last CT abdomen and pelvis also showed micronodular contour of the liver with inability to exclude chronic hepatitis or cirrhosis. Recommend low-fat diet and exercise as liver disease with previously determined be likely fatty liver/nonalcoholic steatohepatitis. We requested heme positive stool results and the primary care provider. We will plan to proceed with a colonoscopy and endoscopy to further evaluate his anemia, heme positive stool, and to evaluate for varices given cirrhosis. We'll have him follow-up in 3 months for further workup related to his cirrhosis. Recent labs  have demonstrated elevated LFTs including elevated AST/ALT, alkaline phosphatase, and bilirubin. Bili counts remained normal.  Proceed with TCS and EGD with Dr. Gala Romney in near future: the risks, benefits, and alternatives have been discussed with the patient in detail. The patient states understanding and desires to proceed.  The patient is not on any chronic pain medications, anxiolytics, antidepressants. He is not on any anticoagulants. Last procedures were completed with conscious sedation which are adequate for him at that time. Conscious sedation should again be adequate for his procedures.          Imaging: No results found.

## 2015-06-26 NOTE — Patient Instructions (Signed)
Your physician wants you to follow-up in: 3 months with Arnold Long NP You will receive a reminder letter in the mail two months in advance. If you don't receive a letter, please call our office to schedule the follow-up appointment.     Your physician recommends that you continue on your current medications as directed. Please refer to the Current Medication list given to you today.       Thank you for choosing [facility] !

## 2015-06-30 ENCOUNTER — Telehealth: Payer: Self-pay | Admitting: Internal Medicine

## 2015-06-30 ENCOUNTER — Encounter: Payer: Self-pay | Admitting: Internal Medicine

## 2015-06-30 NOTE — Telephone Encounter (Signed)
Manuela Schwartz, Dr.Rourk has not done a letter on this pt yet.  I will route this message to him so he can do one.

## 2015-06-30 NOTE — Telephone Encounter (Signed)
[name] from [facility]/Dr Selvedge's office called to say that they received the colonoscopy and EGD reports, but there were no recommendations. Procedures were done on Oct 3 and she said the "letter was blank", but I don't see a letter that RMR would have done. Can you call [name] back at [phone] and see exactly what Dr Preston Fleeting is needing from [name]. We would've cc'ed the OV note, Path report and letter.

## 2015-06-30 NOTE — Telephone Encounter (Signed)
done

## 2015-06-30 NOTE — Telephone Encounter (Signed)
Letter mailed to the pt.  Manuela Schwartz, please fax the letter to [place].

## 2015-06-30 NOTE — Telephone Encounter (Signed)
See dictated letter

## 2015-08-13 ENCOUNTER — Ambulatory Visit (INDEPENDENT_AMBULATORY_CARE_PROVIDER_SITE_OTHER): Payer: Medicare Other | Admitting: *Deleted

## 2015-08-13 DIAGNOSIS — I495 Sick sinus syndrome: Secondary | ICD-10-CM

## 2015-08-13 NOTE — Progress Notes (Signed)
Remote pacemaker transmission.   

## 2015-08-25 ENCOUNTER — Other Ambulatory Visit: Payer: Self-pay | Admitting: Cardiology

## 2015-08-25 LAB — CUP PACEART REMOTE DEVICE CHECK
Battery Impedance: 135 Ohm
Battery Remaining Longevity: 146 mo
Brady Statistic AP VP Percent: 0 %
Brady Statistic AP VS Percent: 0 %
Implantable Lead Implant Date: 20050923
Implantable Lead Location: 753859
Implantable Lead Model: 4524
Implantable Lead Model: 5024
Lead Channel Impedance Value: 315 Ohm
Lead Channel Impedance Value: 468 Ohm
Lead Channel Sensing Intrinsic Amplitude: 8 mV
Lead Channel Setting Pacing Amplitude: 2 V
MDC IDC LEAD IMPLANT DT: 20050923
MDC IDC LEAD LOCATION: 753860
MDC IDC MSMT BATTERY VOLTAGE: 2.79 V
MDC IDC MSMT LEADCHNL RA SENSING INTR AMPL: 1.4 mV
MDC IDC MSMT LEADCHNL RV PACING THRESHOLD AMPLITUDE: 0.375 V
MDC IDC MSMT LEADCHNL RV PACING THRESHOLD PULSEWIDTH: 0.4 ms
MDC IDC SESS DTM: 20161123141106
MDC IDC SET LEADCHNL RA PACING AMPLITUDE: 2.5 V
MDC IDC SET LEADCHNL RV PACING PULSEWIDTH: 0.4 ms
MDC IDC SET LEADCHNL RV SENSING SENSITIVITY: 2.8 mV
MDC IDC STAT BRADY AS VP PERCENT: 0 %
MDC IDC STAT BRADY AS VS PERCENT: 100 %

## 2015-08-26 ENCOUNTER — Encounter: Payer: Self-pay | Admitting: Cardiology

## 2015-09-01 ENCOUNTER — Encounter: Payer: Self-pay | Admitting: Nurse Practitioner

## 2015-09-01 ENCOUNTER — Ambulatory Visit (INDEPENDENT_AMBULATORY_CARE_PROVIDER_SITE_OTHER): Payer: Medicare Other | Admitting: Nurse Practitioner

## 2015-09-01 VITALS — BP 126/84 | HR 96 | Temp 97.1°F | Ht 63.0 in | Wt 197.4 lb

## 2015-09-01 DIAGNOSIS — D126 Benign neoplasm of colon, unspecified: Secondary | ICD-10-CM | POA: Diagnosis not present

## 2015-09-01 DIAGNOSIS — K3189 Other diseases of stomach and duodenum: Secondary | ICD-10-CM | POA: Diagnosis not present

## 2015-09-01 DIAGNOSIS — K766 Portal hypertension: Secondary | ICD-10-CM

## 2015-09-01 DIAGNOSIS — K7460 Unspecified cirrhosis of liver: Secondary | ICD-10-CM | POA: Diagnosis not present

## 2015-09-01 NOTE — Progress Notes (Signed)
cc'ed to pcp °

## 2015-09-01 NOTE — Progress Notes (Signed)
Referring Provider: Petra Kuba, MD Primary Care Physician:  Petra Kuba, MD Primary GI:   Dr. Gala Romney  Chief Complaint  Patient presents with  . Follow-up    HPI:   54 year old male presents for follow-up on heme positive stool status post colonoscopy and endoscopy as well as elevated LFTs. Last seen in our office 06/02/2015 when he was found to have elevated LFTs including AST/ALT, alkaline phosphatase, and bilirubin. Noted possible cirrhosis on CT of the abdomen deemed likely due to fatty liver/NASH. Colonoscopy completed on 06/23/2015 found mild portal gastropathy, no esophageal varices. Recommend repeat endoscopy in 2 years. Colonoscopy completed on the same day found portal colopathy, colonic diverticulosis, single polyp measuring 5 mm the base the cecum which was removed and sent to pathology. Pathology report showed polyp to be tubular adenoma and recommended repeat surveillance colonoscopy in 5 years.   Today he states yellowing of skin and eyes, hematochezia, melena, darkened urine, acute episodes of confusion. Had to back off stool softener temporarily because stools were too loose, are back to normal now. Deneis excessive bleeding/bruising. Denies chest pain, dyspnea, dizziness, lightheadedness, syncope, near syncope. Denies any other upper or lower GI symptoms.   Past Medical History  Diagnosis Date  . Chest pain 2007, 2009    cardiac cath > normal coronary arterie; nl left ventricular function  . Cardiac conduction disorder     status post pacemaker implantation in 1995 generator replaced 2005  . Aortic stenosis     mild  . Mitral regurgitation     insignificant  . HTN (hypertension)   . Hyperlipidemia     statin discontinued due to abn LFTs  . Syncope   . Obesity   . Pelvic mass     identified in 2009, stable 2010  . 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 Diabetes mellitus     +Insulin    . Benign hypertrophy of prostate     with urinary retention; repaired hypospadia; transurethral resection of the prostate   . GERD (gastroesophageal reflux disease)     status post multiple dilatations for stricture  . Congenital deafness   . Sleep apnea     BiPAP and continuous oxygen  . Obesity 4/08    BMI= 40  . Anemia   . Alcohol use ([place])   . COPD (chronic obstructive pulmonary disease) ([place])   . Thyroid disease   . Edema   . Atrial flutter [facility])     Past Surgical History  Procedure Laterality Date  . Orchiectomy  1990    bilateral; ?neoplasm  . Repair hypospadias w/ urethroplasty    . Transurethral resection of prostate      for BPH  . Tonsillectomy and adenoidectomy    . A-v cardiac pacemaker insertion  1995  . Colonoscopy  2008  . Pacemaker insertion  05/2004; 10/04/2013    MDT dual chamber pacemaker; gen change 10/04/2013 (MDT ADDRL1)  . Permanent pacemaker generator change N/A 10/04/2013    Procedure: PERMANENT PACEMAKER GENERATOR CHANGE;  Surgeon: Evans Lance, MD;  Location: [facility] CATH LAB;  Service: Cardiovascular;  Laterality: N/A;  . Colonoscopy N/A 06/23/2015    [id number] diverticulosis  . Esophagogastroduodenoscopy N/A 06/23/2015    [id number] portable gastropathy    Current Outpatient Prescriptions  Medication Sig Dispense Refill  . albuterol (PROVENTIL) (2.5 MG/3ML) 0.083% nebulizer solution Take 2.5 mg by nebulization every 6 (six) hours as needed for  wheezing or shortness of breath.    Marland Kitchen albuterol (VENTOLIN HFA) 108 (90 BASE) MCG/ACT inhaler Inhale 2 puffs into the lungs every 6 (six) hours as needed for wheezing.     Marland Kitchen aspirin 325 MG EC tablet Take 325 mg by mouth daily.    . budesonide-formoterol (SYMBICORT) 160-4.5 MCG/ACT inhaler Inhale 2 puffs into the lungs 2 (two) times daily.    . cetirizine (ZYRTEC) 10 MG tablet Take 10 mg by mouth daily.    Marland Kitchen HUMALOG MIX 75/25 KWIKPEN (75-25) 100 UNIT/ML Kwikpen Inject 80 Units into the skin daily with breakfast.  90 units with supper    . levothyroxine (LEVOXYL) 50 MCG tablet Take 50 mcg by mouth daily.      Marland Kitchen losartan (COZAAR) 25 MG tablet Take 1 tablet (25 mg total) by mouth daily. 90 tablet 3  . metoprolol tartrate (LOPRESSOR) 25 MG tablet Take 1 tablet (25 mg total) by mouth 2 (two) times daily. 60 tablet 11  . Misc Natural Products (OSTEO BI-FLEX TRIPLE STRENGTH PO) Take 1 tablet by mouth daily.    . modafinil (PROVIGIL) 200 MG tablet Take 200 mg by mouth daily.     . Multiple Vitamins-Minerals (CENTRUM) tablet Take 1 tablet by mouth daily.      . mupirocin ointment (BACTROBAN) 2 % Place 1 application into the nose 3 (three) times daily.    . nitroGLYCERIN (NITROSTAT) 0.4 MG SL tablet Place 1 tablet (0.4 mg total) under the tongue every 5 (five) minutes as needed for chest pain. 25 tablet 3  . Omega-3 Fatty Acids (FISH OIL) 1000 MG CAPS Take 1 capsule by mouth daily.    Marland Kitchen omeprazole (PRILOSEC) 40 MG capsule TAKE 1 CAPSULE BY MOUTH ONCE DAILY FOR REFLUX. 30 capsule 3  . polyethylene glycol powder (GLYCOLAX/MIRALAX) powder Take 17 g by mouth daily as needed for mild constipation. 255 g 0  . Psyllium (NATURAL FIBER PO) Take 6 capsules by mouth daily.    . Simethicone (GAS RELIEF 80 PO) Take 1 capsule by mouth daily as needed (gas).     . sodium chloride (OCEAN) 0.65 % SOLN nasal spray Place 1 spray into both nostrils as needed (nose bleeds).     . polyethylene glycol-electrolytes (NULYTELY/GOLYTELY) 420 G solution Take 4,000 mLs by mouth once. (Patient not taking: Reported on 09/01/2015) 4000 mL 0   No current facility-administered medications for this visit.    Allergies as of 09/01/2015 - Review Complete 09/01/2015  Allergen Reaction Noted  . Ciprofloxacin  10/27/2013  . Eliquis [apixaban] Other (See Comments) 03/18/2014  . Enalapril Other (See Comments) 04/15/2011  . Metformin and related  07/30/2013  . Penicillins Other (See Comments)     Family History  Problem Relation Age of Onset  .  COPD Mother   . Hypertension Mother   . Pneumonia Father   . Hypertension Other   . Heart disease Other     Social History   Social History  . Marital Status: Married    Spouse Name: N/A  . Number of Children: N/A  . Years of Education: N/A   Social History Main Topics  . Smoking status: Never Smoker   . Smokeless tobacco: Never Used     Comment: tobacco use- no   . Alcohol Use: No  . Drug Use: No  . Sexual Activity: Not Asked   Other Topics Concern  . None   Social History Narrative   Part time.     Review of Systems: General:  Negative for anorexia, weight loss, fever, chills, fatigue, weakness. ENT: Negative for hoarseness, difficulty swallowing. CV: Negative for chest pain, angina, palpitations, peripheral edema.  Respiratory: Negative for dyspnea at rest, cough, sputum, wheezing.  GI: See history of present illness. Endo: Negative for unusual weight change.  Heme: Negative for bruising or bleeding.   Physical Exam: BP 126/84 mmHg  Pulse 96  Temp(Src) 97.1 F (36.2 C) (Oral)  Ht 5\' 3"  (1.6 m)  Wt 197 lb 6.4 oz (89.54 kg)  BMI 34.98 kg/m2 General:   Alert and oriented. Pleasant and cooperative. Well-nourished and well-developed.  Head:  Normocephalic and atraumatic. Eyes:  Without icterus, sclera clear and conjunctiva pink.  Ears:  Wears hearing aids. Cardiovascular:  S1, S2 present without murmurs appreciated. Extremities without clubbing or edema. Respiratory:  Clear to auscultation bilaterally. No wheezes, rales, or rhonchi. No distress.  Gastrointestinal:  +BS, rounded but soft, non-tender and non-distended. No HSM noted. No guarding or rebound.  Rectal:  Deferred  Neurologic:  Alert and oriented x4;  grossly normal neurologically. Psych:  Alert and cooperative. Normal mood and affect. Heme/Lymph/Immune: No excessive bruising noted.    09/01/2015 10:17 AM

## 2015-09-01 NOTE — Assessment & Plan Note (Signed)
Patient with singular tubular adenoma colon polyp. Recommend surveillance colonoscopy in 5 years. Patient is on the recall list. Return for follow-up in 6 months for routine cirrhosis care or sooner if any worsening GI signs or symptoms.

## 2015-09-01 NOTE — Assessment & Plan Note (Signed)
Gastropathy noted on endoscopy, no esophageal varices noted. Consistent with other signs and symptoms indicate cirrhosis. Repeat EGD in 2 years. Return for office follow-up in 6 months for routine liver care. Further workup today has noted above

## 2015-09-01 NOTE — Assessment & Plan Note (Signed)
Patient with CT imaging and labs indicative of cirrhosis. Most likely due to NASH. Today we'll recheck his labs including CBC, CMP, PTT/INR, AFP. We will also proceed with serologic workup for autoimmune etiology including ANA, antimicrosomal antibody of the kidney and liver, and anti-smooth muscle antibody. We will also send him for ultrasound elastography to confirm cirrhosis and Matavir score. Return for follow-up in 6 months for routine cirrhosis care. Reinforced diet and exercise

## 2015-09-01 NOTE — Patient Instructions (Signed)
1. Have your labs drawn this week when you're able to. 2. We will help you schedule your ultrasound. 3. Return for follow-up in 6 months for routine liver care. 4. Call us if anything changes or gets worse.

## 2015-09-03 ENCOUNTER — Ambulatory Visit (HOSPITAL_COMMUNITY)
Admission: RE | Admit: 2015-09-03 | Discharge: 2015-09-03 | Disposition: A | Discharge status: Home / Self Care | Payer: Medicare Other | Source: Ambulatory Visit | Attending: Nurse Practitioner | Admitting: Nurse Practitioner

## 2015-09-03 DIAGNOSIS — K3189 Other diseases of stomach and duodenum: Secondary | ICD-10-CM | POA: Diagnosis not present

## 2015-09-03 DIAGNOSIS — R932 Abnormal findings on diagnostic imaging of liver and biliary tract: Secondary | ICD-10-CM | POA: Insufficient documentation

## 2015-09-03 DIAGNOSIS — K7460 Unspecified cirrhosis of liver: Secondary | ICD-10-CM | POA: Diagnosis present

## 2015-09-03 LAB — CBC WITH DIFFERENTIAL/PLATELET
BASOS ABS: 0.1 10*3/uL (ref 0.0–0.1)
BASOS PCT: 1 % (ref 0–1)
EOS ABS: 0.1 10*3/uL (ref 0.0–0.7)
Eosinophils Relative: 2 % (ref 0–5)
HCT: 48.2 % (ref 39.0–52.0)
HEMOGLOBIN: 15.7 g/dL (ref 13.0–17.0)
Lymphocytes Relative: 21 % (ref 12–46)
Lymphs Abs: 1.2 10*3/uL (ref 0.7–4.0)
MCH: 31.2 pg (ref 26.0–34.0)
MCHC: 32.6 g/dL (ref 30.0–36.0)
MCV: 95.6 fL (ref 78.0–100.0)
MPV: 11.8 fL (ref 8.6–12.4)
Monocytes Absolute: 0.6 10*3/uL (ref 0.1–1.0)
Monocytes Relative: 10 % (ref 3–12)
NEUTROS ABS: 3.8 10*3/uL (ref 1.7–7.7)
NEUTROS PCT: 66 % (ref 43–77)
PLATELETS: 159 10*3/uL (ref 150–400)
RBC: 5.04 MIL/uL (ref 4.22–5.81)
RDW: 15.1 % (ref 11.5–15.5)
WBC: 5.8 10*3/uL (ref 4.0–10.5)

## 2015-09-03 LAB — COMPREHENSIVE METABOLIC PANEL
ALBUMIN: 3.5 g/dL — AB (ref 3.6–5.1)
ALT: 69 U/L — ABNORMAL HIGH (ref 9–46)
AST: 75 U/L — AB (ref 10–35)
Alkaline Phosphatase: 468 U/L — ABNORMAL HIGH (ref 40–115)
BILIRUBIN TOTAL: 2.1 mg/dL — AB (ref 0.2–1.2)
BUN: 16 mg/dL (ref 7–25)
CHLORIDE: 97 mmol/L — AB (ref 98–110)
CO2: 30 mmol/L (ref 20–31)
CREATININE: 0.83 mg/dL (ref 0.70–1.33)
Calcium: 8.9 mg/dL (ref 8.6–10.3)
Glucose, Bld: 181 mg/dL — ABNORMAL HIGH (ref 65–99)
Potassium: 4.8 mmol/L (ref 3.5–5.3)
SODIUM: 136 mmol/L (ref 135–146)
TOTAL PROTEIN: 5.9 g/dL — AB (ref 6.1–8.1)

## 2015-09-03 LAB — PROTIME-INR
INR: 1.03 (ref <1.50)
PROTHROMBIN TIME: 13.6 s (ref 11.6–15.2)

## 2015-09-04 LAB — ANA: ANA: NEGATIVE

## 2015-09-04 LAB — AFP TUMOR MARKER: AFP-Tumor Marker: 1.6 ng/mL (ref <6.1)

## 2015-09-04 NOTE — Progress Notes (Signed)
Updated with labs from this visit: MELD: 10 Child-Pugh: Class B

## 2015-09-05 ENCOUNTER — Other Ambulatory Visit (HOSPITAL_COMMUNITY): Payer: Medicare Other

## 2015-09-05 LAB — ANTI-SMOOTH MUSCLE ANTIBODY, IGG: SMOOTH MUSCLE AB: 2 U (ref <20)

## 2015-09-09 LAB — ANTI-MICROSOMAL ANTIBODY LIVER / KIDNEY

## 2015-09-29 ENCOUNTER — Ambulatory Visit: Payer: Medicare Other | Admitting: Adult Health

## 2015-10-13 ENCOUNTER — Encounter: Payer: Self-pay | Admitting: Adult Health

## 2015-10-13 ENCOUNTER — Ambulatory Visit (INDEPENDENT_AMBULATORY_CARE_PROVIDER_SITE_OTHER): Payer: Medicare Other | Admitting: Adult Health

## 2015-10-13 VITALS — BP 126/78 | HR 84 | Ht 63.0 in | Wt 194.0 lb

## 2015-10-13 DIAGNOSIS — I739 Peripheral vascular disease, unspecified: Secondary | ICD-10-CM

## 2015-10-13 DIAGNOSIS — I999 Unspecified disorder of circulatory system: Secondary | ICD-10-CM

## 2015-10-13 DIAGNOSIS — I350 Nonrheumatic aortic (valve) stenosis: Secondary | ICD-10-CM

## 2015-10-13 DIAGNOSIS — R0989 Other specified symptoms and signs involving the circulatory and respiratory systems: Secondary | ICD-10-CM

## 2015-10-13 NOTE — Progress Notes (Signed)
Cardiology Office Note   Date:  10/13/2015   ID:  Nicholas, [name] Feb 28, 1961, MRN [id number]  PCP:  Petra Kuba, MD  Cardiologist: Learta Codding, NP   No chief complaint on file.     History of Present Illness: [name] is a 55 y.o. male who presents for ongoing assessment and management of atrial flutter, CHADS VASC Score of 3, not on anticoagulation therapy, do to frequent epistaxis, and ER visits as a result. Remains on aspirin. Moderate aortic stenosis, tachybradycardia syndrome,status post Medtronic PPM, hypertension, with history of diabetes.  He comes today without any complaints. His wife is his main caregiver. He denies chest pain or dyspnea. He is medically compliant.   Past Medical History  Diagnosis Date  . Chest pain 2007, 2009    cardiac cath > normal coronary arterie; nl left ventricular function  . Cardiac conduction disorder     status post pacemaker implantation in 1995 generator replaced 2005  . Aortic stenosis     mild  . Mitral regurgitation     insignificant  . HTN (hypertension)   . Hyperlipidemia     statin discontinued due to abn LFTs  . Syncope   . Obesity   . Pelvic mass     identified in 2009, stable 2010  . 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 Diabetes mellitus     +Insulin  . Benign hypertrophy of prostate     with urinary retention; repaired hypospadia; transurethral resection of the prostate   . GERD (gastroesophageal reflux disease)     status post multiple dilatations for stricture  . Congenital deafness   . Sleep apnea     BiPAP and continuous oxygen  . Obesity 4/08    BMI= 40  . Anemia   . Alcohol use ([place])   . COPD (chronic obstructive pulmonary disease) ([place])   . Thyroid disease   . Edema   . Atrial flutter [facility])     Past Surgical History  Procedure Laterality Date  . Orchiectomy  1990    bilateral; ?neoplasm  . Repair  hypospadias w/ urethroplasty    . Transurethral resection of prostate      for BPH  . Tonsillectomy and adenoidectomy    . A-v cardiac pacemaker insertion  1995  . Colonoscopy  2008  . Pacemaker insertion  05/2004; 10/04/2013    MDT dual chamber pacemaker; gen change 10/04/2013 (MDT ADDRL1)  . Permanent pacemaker generator change N/A 10/04/2013    Procedure: PERMANENT PACEMAKER GENERATOR CHANGE;  Surgeon: Evans Lance, MD;  Location: [facility] CATH LAB;  Service: Cardiovascular;  Laterality: N/A;  . Colonoscopy N/A 06/23/2015    [id number] diverticulosis  . Esophagogastroduodenoscopy N/A 06/23/2015    [id number] portable gastropathy     Current Outpatient Prescriptions  Medication Sig Dispense Refill  . albuterol (PROVENTIL) (2.5 MG/3ML) 0.083% nebulizer solution Take 2.5 mg by nebulization every 6 (six) hours as needed for wheezing or shortness of breath.    Marland Kitchen albuterol (VENTOLIN HFA) 108 (90 BASE) MCG/ACT inhaler Inhale 2 puffs into the lungs every 6 (six) hours as needed for wheezing.     Marland Kitchen aspirin 325 MG EC tablet Take 325 mg by mouth daily.    . budesonide-formoterol (SYMBICORT) 160-4.5 MCG/ACT inhaler Inhale 2 puffs into the lungs 2 (two) times daily.    . cetirizine (ZYRTEC) 10 MG tablet Take 10 mg by mouth daily.    Marland Kitchen  HUMALOG MIX 75/25 KWIKPEN (75-25) 100 UNIT/ML Kwikpen Inject 80 Units into the skin daily with breakfast. 90 units with supper    . levothyroxine (LEVOXYL) 50 MCG tablet Take 50 mcg by mouth daily.      Marland Kitchen losartan (COZAAR) 25 MG tablet Take 1 tablet (25 mg total) by mouth daily. 90 tablet 3  . metoprolol tartrate (LOPRESSOR) 25 MG tablet Take 1 tablet (25 mg total) by mouth 2 (two) times daily. 60 tablet 11  . Misc Natural Products (OSTEO BI-FLEX TRIPLE STRENGTH PO) Take 1 tablet by mouth daily.    . modafinil (PROVIGIL) 200 MG tablet Take 200 mg by mouth daily.     . Multiple Vitamins-Minerals (CENTRUM) tablet Take 1 tablet by mouth daily.      . mupirocin ointment  (BACTROBAN) 2 % Place 1 application into the nose 3 (three) times daily.    . nitroGLYCERIN (NITROSTAT) 0.4 MG SL tablet Place 1 tablet (0.4 mg total) under the tongue every 5 (five) minutes as needed for chest pain. 25 tablet 3  . Omega-3 Fatty Acids (FISH OIL) 1000 MG CAPS Take 1 capsule by mouth daily.    Marland Kitchen omeprazole (PRILOSEC) 40 MG capsule TAKE 1 CAPSULE BY MOUTH ONCE DAILY FOR REFLUX. 30 capsule 3  . polyethylene glycol powder (GLYCOLAX/MIRALAX) powder Take 17 g by mouth daily as needed for mild constipation. 255 g 0  . polyethylene glycol-electrolytes (NULYTELY/GOLYTELY) 420 G solution Take 4,000 mLs by mouth once. 4000 mL 0  . Psyllium (NATURAL FIBER PO) Take 6 capsules by mouth daily.    . Simethicone (GAS RELIEF 80 PO) Take 1 capsule by mouth daily as needed (gas).     . sodium chloride (OCEAN) 0.65 % SOLN nasal spray Place 1 spray into both nostrils as needed (nose bleeds).      No current facility-administered medications for this visit.    Allergies:   Ciprofloxacin; Eliquis; Enalapril; Metformin and related; and Penicillins    Social History:  The patient  reports that he has never smoked. He has never used smokeless tobacco. He reports that he does not drink alcohol or use illicit drugs.   Family History:  [name] family history includes COPD in his mother; Heart disease in his other; Hypertension in his mother and other; Pneumonia in his father.    ROS: All other systems are reviewed and negative. Unless otherwise mentioned in H&P    PHYSICAL EXAM: VS:  BP 126/78 mmHg  Pulse 84  Ht 5\' 3"  (1.6 m)  Wt 194 lb (87.998 kg)  BMI 34.37 kg/m2  SpO2 90% , BMI Body mass index is 34.37 kg/(m^2). GEN: Well nourished, well developed, in no acute distress HEENT: normal Neck: no JVD, carotid bruits, or masses Cardiac: RRR; 2/6 systolic murmur.  Respiratory:  clear to auscultation bilaterally, normal work of breathing GI: soft, nontender, nondistended, + BS MS: no  deformity or atrophyNo pulses palpable in the LEE bilaterally. Cool to touch on the left. Doppler pulse at the PT. Skin is shiny and hairless bilateral LE.  Skin: warm and dry, no rash Neuro:  Strength and sensation are intact Psych: euthymic mood, full affect  Recent Labs: 09/01/2015: ALT 69*; BUN 16; Creat 0.83; Hemoglobin 15.7; Platelets 159; Potassium 4.8; Sodium 136    Lipid Panel    Component Value Date/Time   CHOL 141 05/19/2010 2137   TRIG 179* 05/19/2010 2137   HDL 33* 05/19/2010 2137   CHOLHDL 4.3 Ratio 05/19/2010 2137   VLDL 36  05/19/2010 2137   [place] 72 05/19/2010 2137      Wt Readings from Last 3 Encounters:  10/13/15 194 lb (87.998 kg)  09/01/15 197 lb 6.4 oz (89.54 kg)  06/26/15 194 lb 6.4 oz (88.179 kg)     ASSESSMENT AND PLAN:  1. Probable PAD: Admits to leg pain and claudication symptoms when I ask him about it. Used doppler to auscultate pulse on the left. Unable to hear DP, but PT was faint. Pulse weak but palpable on the right. Will send for ABI; for evaluation of extent of PAD.   2. AoV stenosis: He is asymptomatic with this currently. No plans for invasive treatment at this time.   3. Atrial Fibrillation: Rate is controlled currently. Not on anticoagulation due to GIB.   4. Hypertension: Well controlled currently. Continue  Regimen.    Current medicines are reviewed at length with the patient today.    Labs/ tests ordered today include: Bilateral ABI     Disposition:   FU with after ABI.    Signed, Jory Sims, NP  10/13/2015 2:11 PM    [facility] [address], [place], [place] [address] Phone: [phone]; Fax: [phone]

## 2015-10-13 NOTE — Progress Notes (Deleted)
Name: [name]    DOB: 03-May-1961  Age: 55 y.o.  MR#: [id number]       PCP:  Petra Kuba, MD      Insurance: Payor: [employer] MEDICARE / Plan: [facility] MEDICARE / Product Type: *No Product type* /   CC:   No chief complaint on file.   VS Filed Vitals:   10/13/15 1402  BP: 126/78  Pulse: 84  Height: 5\' 3"  (1.6 m)  Weight: 194 lb (87.998 kg)  SpO2: 90%    Weights Current Weight  10/13/15 194 lb (87.998 kg)  09/01/15 197 lb 6.4 oz (89.54 kg)  06/26/15 194 lb 6.4 oz (88.179 kg)    Blood Pressure  BP Readings from Last 3 Encounters:  10/13/15 126/78  09/01/15 126/84  06/26/15 128/64     Admit date:  (Not on file) Last encounter with RMR:  06/26/2015   Allergy Ciprofloxacin; Eliquis; Enalapril; Metformin and related; and Penicillins  Current Outpatient Prescriptions  Medication Sig Dispense Refill  . albuterol (PROVENTIL) (2.5 MG/3ML) 0.083% nebulizer solution Take 2.5 mg by nebulization every 6 (six) hours as needed for wheezing or shortness of breath.    Marland Kitchen albuterol (VENTOLIN HFA) 108 (90 BASE) MCG/ACT inhaler Inhale 2 puffs into the lungs every 6 (six) hours as needed for wheezing.     Marland Kitchen aspirin 325 MG EC tablet Take 325 mg by mouth daily.    . budesonide-formoterol (SYMBICORT) 160-4.5 MCG/ACT inhaler Inhale 2 puffs into the lungs 2 (two) times daily.    . cetirizine (ZYRTEC) 10 MG tablet Take 10 mg by mouth daily.    Marland Kitchen HUMALOG MIX 75/25 KWIKPEN (75-25) 100 UNIT/ML Kwikpen Inject 80 Units into the skin daily with breakfast. 90 units with supper    . levothyroxine (LEVOXYL) 50 MCG tablet Take 50 mcg by mouth daily.      Marland Kitchen losartan (COZAAR) 25 MG tablet Take 1 tablet (25 mg total) by mouth daily. 90 tablet 3  . metoprolol tartrate (LOPRESSOR) 25 MG tablet Take 1 tablet (25 mg total) by mouth 2 (two) times daily. 60 tablet 11  . Misc Natural Products (OSTEO BI-FLEX TRIPLE STRENGTH PO) Take 1 tablet by mouth daily.    . modafinil (PROVIGIL) 200 MG tablet Take  200 mg by mouth daily.     . Multiple Vitamins-Minerals (CENTRUM) tablet Take 1 tablet by mouth daily.      . mupirocin ointment (BACTROBAN) 2 % Place 1 application into the nose 3 (three) times daily.    . nitroGLYCERIN (NITROSTAT) 0.4 MG SL tablet Place 1 tablet (0.4 mg total) under the tongue every 5 (five) minutes as needed for chest pain. 25 tablet 3  . Omega-3 Fatty Acids (FISH OIL) 1000 MG CAPS Take 1 capsule by mouth daily.    Marland Kitchen omeprazole (PRILOSEC) 40 MG capsule TAKE 1 CAPSULE BY MOUTH ONCE DAILY FOR REFLUX. 30 capsule 3  . polyethylene glycol powder (GLYCOLAX/MIRALAX) powder Take 17 g by mouth daily as needed for mild constipation. 255 g 0  . polyethylene glycol-electrolytes (NULYTELY/GOLYTELY) 420 G solution Take 4,000 mLs by mouth once. 4000 mL 0  . Psyllium (NATURAL FIBER PO) Take 6 capsules by mouth daily.    . Simethicone (GAS RELIEF 80 PO) Take 1 capsule by mouth daily as needed (gas).     . sodium chloride (OCEAN) 0.65 % SOLN nasal spray Place 1 spray into both nostrils as needed (nose bleeds).      No current facility-administered medications for this visit.  Discontinued Meds:   There are no discontinued medications.  Patient Active Problem List   Diagnosis Date Noted  . Adenomatous colon polyp 09/01/2015  . Portal hypertensive gastropathy   . Heme + stool 06/02/2015  . Leukocytosis, unspecified 10/26/2013  . Hematuria 10/26/2013  . UTI (lower urinary tract infection) 10/25/2013  . HTN (hypertension)   . Atrial fibrillation ([place]) 10/19/2013  . Chronic diastolic heart failure ([place]) 09/19/2013  . Chest pain   . Aortic stenosis   . Hypertension   . Hyperlipidemia   . Pelvic mass   . Hepatic cirrhosis ([place])   . Benign hypertrophy of prostate   . Sleep apnea   . GASTROESOPHAGEAL REFLUX DISEASE 05/19/2010  . Cardiac pacemaker in situ 03/18/2010  . DIABETES MELLITUS, TYPE II, ON INSULIN 06/27/2008  . ANEMIA, MILD 06/27/2008  . DEAFNESS, CONGENITAL 06/27/2008   . CONDUCTION DISORDER OF THE HEART 06/27/2008    LABS    Component Value Date/Time   NA 136 09/01/2015 0806   NA 136 05/13/2015 1150   NA 139 02/12/2015 0809   K 4.8 09/01/2015 0806   K 3.8 05/13/2015 1150   K 4.7 02/12/2015 0809   CL 97* 09/01/2015 0806   CL 99* 05/13/2015 1150   CL 98 02/12/2015 0809   CO2 30 09/01/2015 0806   CO2 28 05/13/2015 1150   CO2 29 02/12/2015 0809   GLUCOSE 181* 09/01/2015 0806   GLUCOSE 112* 05/13/2015 1150   GLUCOSE 140* 02/12/2015 0809   BUN 16 09/01/2015 0806   BUN 21* 05/13/2015 1150   BUN 16 02/12/2015 0809   CREATININE 0.83 09/01/2015 0806   CREATININE 0.87 05/13/2015 1150   CREATININE 0.83 02/12/2015 0809   CREATININE 0.84 04/16/2014 1401   CREATININE 0.87 10/26/2013 0522   CALCIUM 8.9 09/01/2015 0806   CALCIUM 9.0 05/13/2015 1150   CALCIUM 9.0 02/12/2015 0809   GFRNONAA >60 05/13/2015 1150   GFRNONAA >90 04/16/2014 1401   GFRNONAA >90 10/26/2013 0522   GFRAA >60 05/13/2015 1150   GFRAA >90 04/16/2014 1401   GFRAA >90 10/26/2013 0522   CMP     Component Value Date/Time   NA 136 09/01/2015 0806   K 4.8 09/01/2015 0806   CL 97* 09/01/2015 0806   CO2 30 09/01/2015 0806   GLUCOSE 181* 09/01/2015 0806   BUN 16 09/01/2015 0806   CREATININE 0.83 09/01/2015 0806   CREATININE 0.87 05/13/2015 1150   CALCIUM 8.9 09/01/2015 0806   PROT 5.9* 09/01/2015 0806   ALBUMIN 3.5* 09/01/2015 0806   AST 75* 09/01/2015 0806   ALT 69* 09/01/2015 0806   ALKPHOS 468* 09/01/2015 0806   BILITOT 2.1* 09/01/2015 0806   GFRNONAA >60 05/13/2015 1150   GFRAA >60 05/13/2015 1150       Component Value Date/Time   WBC 5.8 09/01/2015 0806   WBC 12.2* 05/13/2015 1150   WBC 9.6 10/03/2014 1933   HGB 15.7 09/01/2015 0806   HGB 16.4 05/13/2015 1150   HGB 13.4 10/03/2014 1933   HCT 48.2 09/01/2015 0806   HCT 49.8 05/13/2015 1150   HCT 42.2 10/03/2014 1933   MCV 95.6 09/01/2015 0806   MCV 95.0 05/13/2015 1150   MCV 84.9 10/03/2014 1933    Lipid  Panel     Component Value Date/Time   CHOL 141 05/19/2010 2137   TRIG 179* 05/19/2010 2137   HDL 33* 05/19/2010 2137   CHOLHDL 4.3 Ratio 05/19/2010 2137   VLDL 36 05/19/2010 2137   LDLCALC 72 05/19/2010 2137  ABG No results found for: PHART, PCO2ART, PO2ART, HCO3, TCO2, ACIDBASEDEF, O2SAT   Lab Results  Component Value Date   TSH 1.77 01/27/2010   BNP (last 3 results) No results for input(s): BNP in the last 8760 hours.  ProBNP (last 3 results) No results for input(s): PROBNP in the last 8760 hours.  Cardiac Panel (last 3 results) No results for input(s): CKTOTAL, CKMB, TROPONINI, RELINDX in the last 72 hours.  Iron/TIBC/Ferritin/ %Sat    Component Value Date/Time   IRON 34* 12/21/2007 0600   TIBC 482* 12/21/2007 0600   FERRITIN 29 12/21/2007 0600   IRONPCTSAT 7* 12/21/2007 0600     EKG Orders placed or performed in visit on 06/26/15  . EKG 12-Lead     Prior Assessment and Plan Problem List as of 10/13/2015      Cardiovascular and Mediastinum   CONDUCTION DISORDER OF THE HEART   Last Assessment & Plan 01/07/2012 Office Visit Written 01/07/2012  1:54 PM by Lendon Colonel, NP    He is doing well from cardiac standpoint. He is without complaint of chest pain or significant DOE.  I have advised him on low sodium diet. He is mentally slow but I think he understands that he should be more compliant with his diet.      Aortic stenosis   Last Assessment & Plan 06/01/2013 Office Visit Written 06/01/2013  4:08 PM by Evans Lance, MD    His aortic stenosis has been mild in the past. On exam today it sounds more moderate to severe. We'll keep an eye on this, and consider repeat echo.      Hypertension   Last Assessment & Plan 10/19/2013 Office Visit Written 10/19/2013  9:13 AM by Evans Lance, MD    His blood pressure is well controlled. He will continue his current medical therapy.      Chronic diastolic heart failure [facility])   Last Assessment & Plan 11/08/2014 Office  Visit Written 11/08/2014 11:20 AM by Evans Lance, MD    His symptoms are class 2A. I have asked him to maintain a low sodium diet. No change in meds.      Atrial fibrillation [facility])   Last Assessment & Plan 11/08/2014 Office Visit Written 11/08/2014 11:20 AM by Evans Lance, MD    He is now chronically in atrial fib. No change in meds. He is not on systemic anti-coagulation due to a risk of bleeding. He has had multiple bleeds on systemic anti-coagulation in the past.      HTN (hypertension)     Digestive   GASTROESOPHAGEAL REFLUX DISEASE   Hepatic cirrhosis [facility])   Last Assessment & Plan 09/01/2015 Office Visit Written 09/01/2015 10:54 AM by Carlis Stable, NP    Patient with CT imaging and labs indicative of cirrhosis. Most likely due to NASH. Today we'll recheck his labs including CBC, CMP, PTT/INR, AFP. We will also proceed with serologic workup for autoimmune etiology including ANA, antimicrosomal antibody of the kidney and liver, and anti-smooth muscle antibody. We will also send him for ultrasound elastography to confirm cirrhosis and Matavir score. Return for follow-up in 6 months for routine cirrhosis care. Reinforced diet and exercise      Portal hypertensive gastropathy   Last Assessment & Plan 09/01/2015 Office Visit Written 09/01/2015 10:53 AM by Carlis Stable, NP    Gastropathy noted on endoscopy, no esophageal varices noted. Consistent with other signs and symptoms indicate cirrhosis. Repeat EGD in 2 years. Return  for office follow-up in 6 months for routine liver care. Further workup today has noted above      Adenomatous colon polyp   Last Assessment & Plan 09/01/2015 Office Visit Written 09/01/2015 10:55 AM by Carlis Stable, NP    Patient with singular tubular adenoma colon polyp. Recommend surveillance colonoscopy in 5 years. Patient is on the recall list. Return for follow-up in 6 months for routine cirrhosis care or sooner if any worsening GI signs or symptoms.         Endocrine   DIABETES MELLITUS, TYPE II, ON INSULIN   Last Assessment & Plan 04/13/2011 Office Visit Edited 04/19/2011  8:23 AM by Yehuda Savannah, MD    Diabetic control was excellent when last assessed approximately one year ago.  Risk of coronary disease is significant in the setting of diabetes and multiple additional contributing factors.  Negative coronary angiography 3 years ago it is somewhat reassuring, but does not protect against future development of disease.  Despite mild LFT abnormalities, if significant hyperlipidemia is present, I would be inclined to proceed with statin therapy.  A repeat lipid profile is pending.        Nervous and Auditory   DEAFNESS, CONGENITAL     Genitourinary   Benign hypertrophy of prostate   UTI (lower urinary tract infection)     Other   ANEMIA, MILD   Last Assessment & Plan 01/05/2013 Office Visit Written 01/05/2013  5:44 PM by Yehuda Savannah, MD    No recent laboratory study results; anemia and other basic medical problems are managed by patient's PCP.      Cardiac pacemaker in situ   Last Assessment & Plan 11/08/2014 Office Visit Written 11/08/2014 11:17 AM by Evans Lance, MD    His Medtronic dual chamber pacemaker is working normally. Will recheck in several months.      Chest pain   Hyperlipidemia   Last Assessment & Plan 11/08/2014 Office Visit Written 11/08/2014 11:21 AM by Evans Lance, MD    He is overweight. I have asked him to try and lose weight and maintain a low fat diet.      Pelvic mass   Sleep apnea   Leukocytosis, unspecified   Hematuria   Heme + stool   Last Assessment & Plan 06/02/2015 Office Visit Written 06/02/2015  3:15 PM by Carlis Stable, NP    Patient has not been seen by our office number of years. He has a history of heme positive stool and has had an extensive evaluation including colonoscopy, endoscopy, and Givens capsule. This is all done approximately 2000 92,010. No overt etiology found. He was also noted to  have fatty liver and recommended diet and exercise. Currently he is again tested positive for heme positive stool. His colonoscopy was approximately 7-8 years ago. His last CT abdomen and pelvis also showed micronodular contour of the liver with inability to exclude chronic hepatitis or cirrhosis. Recommend low-fat diet and exercise as liver disease with previously determined be likely fatty liver/nonalcoholic steatohepatitis. We requested heme positive stool results and the primary care provider. We will plan to proceed with a colonoscopy and endoscopy to further evaluate his anemia, heme positive stool, and to evaluate for varices given cirrhosis. We'll have him follow-up in 3 months for further workup related to his cirrhosis. Recent labs have demonstrated elevated LFTs including elevated AST/ALT, alkaline phosphatase, and bilirubin. Bili counts remained normal.  Proceed with TCS and EGD with Dr. Gala Romney in near  future: the risks, benefits, and alternatives have been discussed with the patient in detail. The patient states understanding and desires to proceed.  The patient is not on any chronic pain medications, anxiolytics, antidepressants. He is not on any anticoagulants. Last procedures were completed with conscious sedation which are adequate for him at that time. Conscious sedation should again be adequate for his procedures.          Imaging: No results found.

## 2015-10-13 NOTE — Patient Instructions (Signed)
Medication Instructions:  Your physician recommends that you continue on your current medications as directed. Please refer to the Current Medication list given to you today.   Labwork: NONE  Testing/Procedures: Your physician has requested that you have an ankle brachial index (ABI). During this test an ultrasound and blood pressure cuff are used to evaluate the arteries that supply the arms and legs with blood. Allow thirty minutes for this exam. There are no restrictions or special instructions.    Follow-Up: Your physician recommends that you schedule a follow-up appointment in: TO BE DETERMINED AFTER TEST    Any Other Special Instructions Will Be Listed Below (If Applicable).     If you need a refill on your cardiac medications before your next appointment, please call your pharmacy.

## 2015-10-17 ENCOUNTER — Ambulatory Visit (HOSPITAL_COMMUNITY)
Admission: RE | Admit: 2015-10-17 | Discharge: 2015-10-17 | Disposition: A | Discharge status: Home / Self Care | Payer: Medicare Other | Source: Ambulatory Visit | Attending: Adult Health | Admitting: Adult Health

## 2015-10-17 DIAGNOSIS — R0989 Other specified symptoms and signs involving the circulatory and respiratory systems: Secondary | ICD-10-CM

## 2015-10-17 DIAGNOSIS — I999 Unspecified disorder of circulatory system: Secondary | ICD-10-CM | POA: Insufficient documentation

## 2015-10-24 ENCOUNTER — Other Ambulatory Visit: Payer: Self-pay | Admitting: Adult Health

## 2015-11-28 ENCOUNTER — Encounter: Payer: Self-pay | Admitting: Internal Medicine

## 2015-11-28 ENCOUNTER — Ambulatory Visit (INDEPENDENT_AMBULATORY_CARE_PROVIDER_SITE_OTHER): Payer: Medicare Other | Admitting: Internal Medicine

## 2015-11-28 VITALS — BP 120/82 | HR 102 | Ht 64.0 in | Wt 191.0 lb

## 2015-11-28 DIAGNOSIS — I480 Paroxysmal atrial fibrillation: Secondary | ICD-10-CM

## 2015-11-28 NOTE — Patient Instructions (Signed)
Your physician wants you to follow-up in: 1 Year with Dr. Lovena Le. You will receive a reminder letter in the mail two months in advance. If you don't receive a letter, please call our office to schedule the follow-up appointment.  Remote monitoring is used to monitor your Pacemaker of ICD from home. This monitoring reduces the number of office visits required to check your device to one time per year. It allows [name] to keep an eye on the functioning of your device to ensure it is working properly. You are scheduled for a device check from home on 03/01/16. You may send your transmission at any time that day. If you have a wireless device, the transmission will be sent automatically. After your physician reviews your transmission, you will receive a postcard with your next transmission date.  If you need a refill on your cardiac medications before your next appointment, please call your pharmacy.  Your physician recommends that you continue on your current medications as directed. Please refer to the Current Medication list given to you today.  Thank you for choosing [facility]!

## 2015-11-28 NOTE — Progress Notes (Signed)
HPI [name] to returns today for followup of diastolic heart failure. He is a very pleasant 55 yo man with a h/o atrial fibrillation, symptomatic tachy-brady syndrome, status post pacemaker insertion, HTN, diastolic heart failure and syncope. He denies chest pain or sob. Minimal palpitations. trace peripheral edema. He has been unable to tolerated systemic anti-coagulation due to nose bleeds. No edema. He was having some numbness in his legs and had an ultrasound that was unremarkable. Allergies  Allergen Reactions  . Ciprofloxacin     Itching and red rash up arm when infusing after 3rd cipro dose for uti on 10/27/13  . Eliquis [Apixaban] Other (See Comments)    bleeding  . Enalapril Other (See Comments)    Cough  . Metformin And Related     Severe diaarhea  . Penicillins Other (See Comments)    Reaction unspecified     Current Outpatient Prescriptions  Medication Sig Dispense Refill  . albuterol (PROVENTIL) (2.5 MG/3ML) 0.083% nebulizer solution Take 2.5 mg by nebulization every 6 (six) hours as needed for wheezing or shortness of breath.    Marland Kitchen albuterol (VENTOLIN HFA) 108 (90 BASE) MCG/ACT inhaler Inhale 2 puffs into the lungs every 6 (six) hours as needed for wheezing.     Marland Kitchen aspirin 325 MG EC tablet Take 325 mg by mouth daily.    . budesonide-formoterol (SYMBICORT) 160-4.5 MCG/ACT inhaler Inhale 2 puffs into the lungs 2 (two) times daily.    . cetirizine (ZYRTEC) 10 MG tablet Take 10 mg by mouth daily.    Marland Kitchen HUMALOG MIX 75/25 KWIKPEN (75-25) 100 UNIT/ML Kwikpen Inject 80 Units into the skin daily with breakfast. 90 units with supper    . levothyroxine (LEVOXYL) 50 MCG tablet Take 50 mcg by mouth daily.      Marland Kitchen losartan (COZAAR) 25 MG tablet TAKE (1) TABLET BY MOUTH DAILY FOR HIGH BLOOD PRESSURE. 30 tablet 3  . metoprolol tartrate (LOPRESSOR) 25 MG tablet Take 1 tablet (25 mg total) by mouth 2 (two) times daily. 60 tablet 11  . Misc Natural Products (OSTEO BI-FLEX TRIPLE  STRENGTH PO) Take 1 tablet by mouth daily.    . modafinil (PROVIGIL) 200 MG tablet Take 200 mg by mouth daily.     . Multiple Vitamins-Minerals (CENTRUM) tablet Take 1 tablet by mouth daily.      . mupirocin ointment (BACTROBAN) 2 % Place 1 application into the nose 3 (three) times daily.    . nitroGLYCERIN (NITROSTAT) 0.4 MG SL tablet Place 1 tablet (0.4 mg total) under the tongue every 5 (five) minutes as needed for chest pain. 25 tablet 3  . Omega-3 Fatty Acids (FISH OIL) 1000 MG CAPS Take 1 capsule by mouth daily.    Marland Kitchen omeprazole (PRILOSEC) 40 MG capsule TAKE 1 CAPSULE BY MOUTH ONCE DAILY FOR REFLUX. 30 capsule 3  . polyethylene glycol powder (GLYCOLAX/MIRALAX) powder Take 17 g by mouth daily as needed for mild constipation. 255 g 0  . polyethylene glycol-electrolytes (NULYTELY/GOLYTELY) 420 G solution Take 4,000 mLs by mouth once. 4000 mL 0  . Psyllium (NATURAL FIBER PO) Take 6 capsules by mouth daily.    . Simethicone (GAS RELIEF 80 PO) Take 1 capsule by mouth daily as needed (gas).     . sodium chloride (OCEAN) 0.65 % SOLN nasal spray Place 1 spray into both nostrils as needed (nose bleeds).      No current facility-administered medications for this visit.     Past Medical History  Diagnosis Date  . Chest pain 2007, 2009    cardiac cath > normal coronary arterie; nl left ventricular function  . Cardiac conduction disorder     status post pacemaker implantation in 1995 generator replaced 2005  . Aortic stenosis     mild  . Mitral regurgitation     insignificant  . HTN (hypertension)   . Hyperlipidemia     statin discontinued due to abn LFTs  . Syncope   . Obesity   . Pelvic mass     identified in 2009, stable 2010  . 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 Diabetes mellitus     +Insulin  . Benign hypertrophy of prostate     with urinary retention; repaired hypospadia; transurethral resection of the prostate     . GERD (gastroesophageal reflux disease)     status post multiple dilatations for stricture  . Congenital deafness   . Sleep apnea     BiPAP and continuous oxygen  . Obesity 4/08    BMI= 40  . Anemia   . Alcohol use ([place])   . COPD (chronic obstructive pulmonary disease) ([place])   . Thyroid disease   . Edema   . Atrial flutter ([place])     ROS:   All systems reviewed and negative except as noted in the HPI.   Past Surgical History  Procedure Laterality Date  . Orchiectomy  1990    bilateral; ?neoplasm  . Repair hypospadias w/ urethroplasty    . Transurethral resection of prostate      for BPH  . Tonsillectomy and adenoidectomy    . A-v cardiac pacemaker insertion  1995  . Colonoscopy  2008  . Pacemaker insertion  05/2004; 10/04/2013    MDT dual chamber pacemaker; gen change 10/04/2013 (MDT ADDRL1)  . Permanent pacemaker generator change N/A 10/04/2013    Procedure: PERMANENT PACEMAKER GENERATOR CHANGE;  Surgeon: Evans Lance, MD;  Location[facility] CATH LAB;  Service: Cardiovascular;  Laterality: N/A;  . Colonoscopy N/A 06/23/2015    [id number] diverticulosis  . Esophagogastroduodenoscopy N/A 06/23/2015    [id number] portable gastropathy     Family History  Problem Relation Age of Onset  . COPD Mother   . Hypertension Mother   . Pneumonia Father   . Hypertension Other   . Heart disease Other      Social History   Social History  . Marital Status: Married    Spouse Name: N/A  . Number of Children: N/A  . Years of Education: N/A   Occupational History  . Not on file.   Social History Main Topics  . Smoking status: Never Smoker   . Smokeless tobacco: Never Used     Comment: tobacco use- no   . Alcohol Use: No  . Drug Use: No  . Sexual Activity: Not on file   Other Topics Concern  . Not on file   Social History Narrative   Part time.      BP 120/82 mmHg  Pulse 102  Ht 5\' 4"  (1.626 m)  Wt 191 lb (86.637 kg)  BMI 32.77 kg/m2  SpO2 90%  Physical  Exam:  Well appearing middle aged man, NAD HEENT: Unremarkable Neck: 7 cm JVD, no thyromegally Back:  No CVA tenderness Lungs:  Clear with no wheezes, rales, or rhonchi HEART:  IRegular rate rhythm, no murmurs, no rubs, no clicks Abd:  soft, positive bowel sounds, no organomegally, no  rebound, no guarding Ext:  2 plus pulses, no edema, no cyanosis, no clubbing Skin:  No rashes no nodules Neuro:  CN II through XII intact, motor grossly intact  PPM interogation - normal DDI PM function  Assess/Plan: 1. PAF - he is at risk for stroke. I will talk to our [place] [facility] to see if he might be a candidate for [facility]. He is minimally symptomatic with his atrial fib but his device demonstrates he is having PAF. 2. Symptomatic sinus node dysfunction - he is s/p PPM and is asymptomatic 3. Diastolic CHF - his symptoms are well controlled. Will follow.  Mikle Bosworth.D.

## 2015-12-19 LAB — CUP PACEART INCLINIC DEVICE CHECK
Date Time Interrogation Session: 20170331120551
Implantable Lead Implant Date: 20050923
Implantable Lead Location: 753860
Implantable Lead Model: 4524
Implantable Lead Model: 5024
MDC IDC LEAD IMPLANT DT: 20050923
MDC IDC LEAD LOCATION: 753859

## 2015-12-23 ENCOUNTER — Other Ambulatory Visit: Payer: Self-pay | Admitting: Cardiology

## 2016-01-26 ENCOUNTER — Encounter: Payer: Self-pay | Admitting: Adult Health

## 2016-01-26 ENCOUNTER — Ambulatory Visit (INDEPENDENT_AMBULATORY_CARE_PROVIDER_SITE_OTHER): Payer: Medicare Other | Admitting: Adult Health

## 2016-01-26 VITALS — BP 124/78 | HR 88 | Ht 61.0 in | Wt 195.0 lb

## 2016-01-26 DIAGNOSIS — I4819 Other persistent atrial fibrillation: Secondary | ICD-10-CM

## 2016-01-26 DIAGNOSIS — I350 Nonrheumatic aortic (valve) stenosis: Secondary | ICD-10-CM | POA: Diagnosis not present

## 2016-01-26 DIAGNOSIS — I481 Persistent atrial fibrillation: Secondary | ICD-10-CM

## 2016-01-26 NOTE — Progress Notes (Signed)
Name: [name]    DOB: 09/08/1961  Age: 55 y.o.  MR#: [id number]       PCP:  Petra Kuba, MD      Insurance: Payor: [employer] MEDICARE / Plan: [facility] MEDICARE / Product Type: *No Product type* /   CC:    Chief Complaint  Patient presents with  . Atrial Fibrillation    VS Filed Vitals:   01/26/16 1336  BP: 124/78  Pulse: 88  Height: 5\' 1"  (1.549 m)  Weight: 195 lb (88.451 kg)  SpO2: 95%    Weights Current Weight  01/26/16 195 lb (88.451 kg)  11/28/15 191 lb (86.637 kg)  10/13/15 194 lb (87.998 kg)    Blood Pressure  BP Readings from Last 3 Encounters:  01/26/16 124/78  11/28/15 120/82  10/13/15 126/78     Admit date:  (Not on file) Last encounter with RMR:  10/24/2015   Allergy Ciprofloxacin; Eliquis; Enalapril; Metformin and related; and Penicillins  Current Outpatient Prescriptions  Medication Sig Dispense Refill  . albuterol (PROVENTIL) (2.5 MG/3ML) 0.083% nebulizer solution Take 2.5 mg by nebulization every 6 (six) hours as needed for wheezing or shortness of breath.    Marland Kitchen albuterol (VENTOLIN HFA) 108 (90 BASE) MCG/ACT inhaler Inhale 2 puffs into the lungs every 6 (six) hours as needed for wheezing.     Marland Kitchen aspirin 325 MG EC tablet Take 325 mg by mouth daily.    . budesonide-formoterol (SYMBICORT) 160-4.5 MCG/ACT inhaler Inhale 2 puffs into the lungs 2 (two) times daily.    . cetirizine (ZYRTEC) 10 MG tablet Take 10 mg by mouth daily.    Marland Kitchen HUMALOG MIX 75/25 KWIKPEN (75-25) 100 UNIT/ML Kwikpen Inject 80 Units into the skin daily with breakfast. 90 units with supper    . levothyroxine (LEVOXYL) 50 MCG tablet Take 50 mcg by mouth daily.      Marland Kitchen losartan (COZAAR) 25 MG tablet TAKE (1) TABLET BY MOUTH DAILY FOR HIGH BLOOD PRESSURE. 30 tablet 3  . metoprolol tartrate (LOPRESSOR) 25 MG tablet Take 1 tablet (25 mg total) by mouth 2 (two) times daily. (Patient taking differently: Take 25 mg by mouth daily. ) 60 tablet 11  . Misc Natural Products (OSTEO BI-FLEX  TRIPLE STRENGTH PO) Take 1 tablet by mouth daily.    . modafinil (PROVIGIL) 200 MG tablet Take 200 mg by mouth daily.     . Multiple Vitamins-Minerals (CENTRUM) tablet Take 1 tablet by mouth daily.      . mupirocin ointment (BACTROBAN) 2 % Place 1 application into the nose 3 (three) times daily.    . nitroGLYCERIN (NITROSTAT) 0.4 MG SL tablet Place 1 tablet (0.4 mg total) under the tongue every 5 (five) minutes as needed for chest pain. 25 tablet 3  . Omega-3 Fatty Acids (FISH OIL) 1000 MG CAPS Take 1 capsule by mouth daily.    Marland Kitchen omeprazole (PRILOSEC) 40 MG capsule TAKE 1 CAPSULE BY MOUTH ONCE DAILY FOR REFLUX. 30 capsule 6  . polyethylene glycol powder (GLYCOLAX/MIRALAX) powder Take 17 g by mouth daily as needed for mild constipation. 255 g 0  . polyethylene glycol-electrolytes (NULYTELY/GOLYTELY) 420 G solution Take 4,000 mLs by mouth once. 4000 mL 0  . Psyllium (NATURAL FIBER PO) Take 6 capsules by mouth daily.    . Simethicone (GAS RELIEF 80 PO) Take 1 capsule by mouth daily as needed (gas).     . sodium chloride (OCEAN) 0.65 % SOLN nasal spray Place 1 spray into both nostrils as needed (  nose bleeds).      No current facility-administered medications for this visit.    Discontinued Meds:   There are no discontinued medications.  Patient Active Problem List   Diagnosis Date Noted  . Adenomatous colon polyp 09/01/2015  . Portal hypertensive gastropathy   . Heme + stool 06/02/2015  . Leukocytosis, unspecified 10/26/2013  . Hematuria 10/26/2013  . UTI (lower urinary tract infection) 10/25/2013  . HTN (hypertension)   . Atrial fibrillation ([place]) 10/19/2013  . Chronic diastolic heart failure ([place]) 09/19/2013  . Chest pain   . Aortic stenosis   . Hypertension   . Hyperlipidemia   . Pelvic mass   . Hepatic cirrhosis ([place])   . Benign hypertrophy of prostate   . Sleep apnea   . GASTROESOPHAGEAL REFLUX DISEASE 05/19/2010  . Cardiac pacemaker in situ 03/18/2010  . DIABETES MELLITUS,  TYPE II, ON INSULIN 06/27/2008  . ANEMIA, MILD 06/27/2008  . DEAFNESS, CONGENITAL 06/27/2008  . CONDUCTION DISORDER OF THE HEART 06/27/2008    LABS    Component Value Date/Time   NA 136 09/01/2015 0806   NA 136 05/13/2015 1150   NA 139 02/12/2015 0809   K 4.8 09/01/2015 0806   K 3.8 05/13/2015 1150   K 4.7 02/12/2015 0809   CL 97* 09/01/2015 0806   CL 99* 05/13/2015 1150   CL 98 02/12/2015 0809   CO2 30 09/01/2015 0806   CO2 28 05/13/2015 1150   CO2 29 02/12/2015 0809   GLUCOSE 181* 09/01/2015 0806   GLUCOSE 112* 05/13/2015 1150   GLUCOSE 140* 02/12/2015 0809   BUN 16 09/01/2015 0806   BUN 21* 05/13/2015 1150   BUN 16 02/12/2015 0809   CREATININE 0.83 09/01/2015 0806   CREATININE 0.87 05/13/2015 1150   CREATININE 0.83 02/12/2015 0809   CREATININE 0.84 04/16/2014 1401   CREATININE 0.87 10/26/2013 0522   CALCIUM 8.9 09/01/2015 0806   CALCIUM 9.0 05/13/2015 1150   CALCIUM 9.0 02/12/2015 0809   GFRNONAA >60 05/13/2015 1150   GFRNONAA >90 04/16/2014 1401   GFRNONAA >90 10/26/2013 0522   GFRAA >60 05/13/2015 1150   GFRAA >90 04/16/2014 1401   GFRAA >90 10/26/2013 0522   CMP     Component Value Date/Time   NA 136 09/01/2015 0806   K 4.8 09/01/2015 0806   CL 97* 09/01/2015 0806   CO2 30 09/01/2015 0806   GLUCOSE 181* 09/01/2015 0806   BUN 16 09/01/2015 0806   CREATININE 0.83 09/01/2015 0806   CREATININE 0.87 05/13/2015 1150   CALCIUM 8.9 09/01/2015 0806   PROT 5.9* 09/01/2015 0806   ALBUMIN 3.5* 09/01/2015 0806   AST 75* 09/01/2015 0806   ALT 69* 09/01/2015 0806   ALKPHOS 468* 09/01/2015 0806   BILITOT 2.1* 09/01/2015 0806   GFRNONAA >60 05/13/2015 1150   GFRAA >60 05/13/2015 1150       Component Value Date/Time   WBC 5.8 09/01/2015 0806   WBC 12.2* 05/13/2015 1150   WBC 9.6 10/03/2014 1933   HGB 15.7 09/01/2015 0806   HGB 16.4 05/13/2015 1150   HGB 13.4 10/03/2014 1933   HCT 48.2 09/01/2015 0806   HCT 49.8 05/13/2015 1150   HCT 42.2 10/03/2014 1933    MCV 95.6 09/01/2015 0806   MCV 95.0 05/13/2015 1150   MCV 84.9 10/03/2014 1933    Lipid Panel     Component Value Date/Time   CHOL 141 05/19/2010 2137   TRIG 179* 05/19/2010 2137   HDL 33* 05/19/2010 2137  CHOLHDL 4.3 Ratio 05/19/2010 2137   VLDL 36 05/19/2010 2137   LDLCALC 72 05/19/2010 2137    ABG No results found for: PHART, PCO2ART, PO2ART, HCO3, TCO2, ACIDBASEDEF, O2SAT   Lab Results  Component Value Date   TSH 1.77 01/27/2010   BNP (last 3 results) No results for input(s): BNP in the last 8760 hours.  ProBNP (last 3 results) No results for input(s): PROBNP in the last 8760 hours.  Cardiac Panel (last 3 results) No results for input(s): CKTOTAL, CKMB, TROPONINI, RELINDX in the last 72 hours.  Iron/TIBC/Ferritin/ %Sat    Component Value Date/Time   IRON 34* 12/21/2007 0600   TIBC 482* 12/21/2007 0600   FERRITIN 29 12/21/2007 0600   IRONPCTSAT 7* 12/21/2007 0600     EKG Orders placed or performed in visit on 06/26/15  . EKG 12-Lead     Prior Assessment and Plan Problem List as of 01/26/2016      Cardiovascular and Mediastinum   CONDUCTION DISORDER OF THE HEART   Last Assessment & Plan 01/07/2012 Office Visit Written 01/07/2012  1:54 PM by Lendon Colonel, NP    He is doing well from cardiac standpoint. He is without complaint of chest pain or significant DOE.  I have advised him on low sodium diet. He is mentally slow but I think he understands that he should be more compliant with his diet.      Aortic stenosis   Last Assessment & Plan 06/01/2013 Office Visit Written 06/01/2013  4:08 PM by Evans Lance, MD    His aortic stenosis has been mild in the past. On exam today it sounds more moderate to severe. We'll keep an eye on this, and consider repeat echo.      Hypertension   Last Assessment & Plan 10/19/2013 Office Visit Written 10/19/2013  9:13 AM by Evans Lance, MD    His blood pressure is well controlled. He will continue his current medical  therapy.      Chronic diastolic heart failure [facility])   Last Assessment & Plan 11/08/2014 Office Visit Written 11/08/2014 11:20 AM by Evans Lance, MD    His symptoms are class 2A. I have asked him to maintain a low sodium diet. No change in meds.      Atrial fibrillation [facility])   Last Assessment & Plan 11/08/2014 Office Visit Written 11/08/2014 11:20 AM by Evans Lance, MD    He is now chronically in atrial fib. No change in meds. He is not on systemic anti-coagulation due to a risk of bleeding. He has had multiple bleeds on systemic anti-coagulation in the past.      HTN (hypertension)     Digestive   GASTROESOPHAGEAL REFLUX DISEASE   Hepatic cirrhosis [facility])   Last Assessment & Plan 09/01/2015 Office Visit Written 09/01/2015 10:54 AM by Carlis Stable, NP    Patient with CT imaging and labs indicative of cirrhosis. Most likely due to NASH. Today we'll recheck his labs including CBC, CMP, PTT/INR, AFP. We will also proceed with serologic workup for autoimmune etiology including ANA, antimicrosomal antibody of the kidney and liver, and anti-smooth muscle antibody. We will also send him for ultrasound elastography to confirm cirrhosis and Matavir score. Return for follow-up in 6 months for routine cirrhosis care. Reinforced diet and exercise      Portal hypertensive gastropathy   Last Assessment & Plan 09/01/2015 Office Visit Written 09/01/2015 10:53 AM by Carlis Stable, NP    Gastropathy noted  on endoscopy, no esophageal varices noted. Consistent with other signs and symptoms indicate cirrhosis. Repeat EGD in 2 years. Return for office follow-up in 6 months for routine liver care. Further workup today has noted above      Adenomatous colon polyp   Last Assessment & Plan 09/01/2015 Office Visit Written 09/01/2015 10:55 AM by Carlis Stable, NP    Patient with singular tubular adenoma colon polyp. Recommend surveillance colonoscopy in 5 years. Patient is on the recall list. Return for follow-up in 6  months for routine cirrhosis care or sooner if any worsening GI signs or symptoms.        Endocrine   DIABETES MELLITUS, TYPE II, ON INSULIN   Last Assessment & Plan 04/13/2011 Office Visit Edited 04/19/2011  8:23 AM by Yehuda Savannah, MD    Diabetic control was excellent when last assessed approximately one year ago.  Risk of coronary disease is significant in the setting of diabetes and multiple additional contributing factors.  Negative coronary angiography 3 years ago it is somewhat reassuring, but does not protect against future development of disease.  Despite mild LFT abnormalities, if significant hyperlipidemia is present, I would be inclined to proceed with statin therapy.  A repeat lipid profile is pending.        Nervous and Auditory   DEAFNESS, CONGENITAL     Genitourinary   Benign hypertrophy of prostate   UTI (lower urinary tract infection)     Other   ANEMIA, MILD   Last Assessment & Plan 01/05/2013 Office Visit Written 01/05/2013  5:44 PM by Yehuda Savannah, MD    No recent laboratory study results; anemia and other basic medical problems are managed by patient's PCP.      Cardiac pacemaker in situ   Last Assessment & Plan 11/08/2014 Office Visit Written 11/08/2014 11:17 AM by Evans Lance, MD    His Medtronic dual chamber pacemaker is working normally. Will recheck in several months.      Chest pain   Hyperlipidemia   Last Assessment & Plan 11/08/2014 Office Visit Written 11/08/2014 11:21 AM by Evans Lance, MD    He is overweight. I have asked him to try and lose weight and maintain a low fat diet.      Pelvic mass   Sleep apnea   Leukocytosis, unspecified   Hematuria   Heme + stool   Last Assessment & Plan 06/02/2015 Office Visit Written 06/02/2015  3:15 PM by Carlis Stable, NP    Patient has not been seen by our office number of years. He has a history of heme positive stool and has had an extensive evaluation including colonoscopy, endoscopy, and Givens  capsule. This is all done approximately 2000 92,010. No overt etiology found. He was also noted to have fatty liver and recommended diet and exercise. Currently he is again tested positive for heme positive stool. His colonoscopy was approximately 7-8 years ago. His last CT abdomen and pelvis also showed micronodular contour of the liver with inability to exclude chronic hepatitis or cirrhosis. Recommend low-fat diet and exercise as liver disease with previously determined be likely fatty liver/nonalcoholic steatohepatitis. We requested heme positive stool results and the primary care provider. We will plan to proceed with a colonoscopy and endoscopy to further evaluate his anemia, heme positive stool, and to evaluate for varices given cirrhosis. We'll have him follow-up in 3 months for further workup related to his cirrhosis. Recent labs have demonstrated elevated LFTs including elevated  AST/ALT, alkaline phosphatase, and bilirubin. Bili counts remained normal.  Proceed with TCS and EGD with Dr. Gala Romney in near future: the risks, benefits, and alternatives have been discussed with the patient in detail. The patient states understanding and desires to proceed.  The patient is not on any chronic pain medications, anxiolytics, antidepressants. He is not on any anticoagulants. Last procedures were completed with conscious sedation which are adequate for him at that time. Conscious sedation should again be adequate for his procedures.          Imaging: No results found.

## 2016-01-26 NOTE — Progress Notes (Signed)
Cardiology Office Note   Date:  01/26/2016   ID:  Oluwasemilore, Sandez [birth year], MRN [id number]  PCP:  Petra Kuba, MD  Cardiologist:  Branch/ Taylor/ Jory Sims, NP   No chief complaint on file.     History of Present Illness: BERKE SCHICK is a 55 y.o. male who presents for ongoing management and assessment of atrial flutter,CHADS Vasc Score of 3, not currently on anticoagulation therapy due to frequent epistaxis, but remains on aspirin. 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 The patient was sent for ABIs to evaluate for level of PAD. Right ABI 1.1 left ABI 1.0. ABIs were normal. With no evidence of clinically significant peripheral arterial disease.  He comes today with no complaints. The patient has no chest pain or dyspnea. He and his wife port in her garden yesterday and he did not have any issues with exertion. He is having no bleeding issues. He is not complaining of palpitations  He iand ins of low intellect but is able to tell me about his symptoms.  Past Medical History  Diagnosis Date  . Chest pain 2007, 2009    cardiac cath > normal coronary arterie; nl left ventricular function  . Cardiac conduction disorder     status post pacemaker implantation in 1995 generator replaced 2005  . Aortic stenosis     mild  . Mitral regurgitation     insignificant  . HTN (hypertension)   . Hyperlipidemia     statin discontinued due to abn LFTs  . Syncope   . Obesity   . Pelvic mass     identified in 2009, stable 2010  . 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 Diabetes mellitus     +Insulin  . Benign hypertrophy of prostate     with urinary retention; repaired hypospadia; transurethral resection of the prostate   . GERD  (gastroesophageal reflux disease)     status post multiple dilatations for stricture  . Congenital deafness   . Sleep apnea     BiPAP and continuous oxygen  . Obesity 4/08    BMI= 40  . Anemia   . Alcohol use ([place])   . COPD (chronic obstructive pulmonary disease) ([place])   . Thyroid disease   . Edema   . Atrial flutter [facility])     Past Surgical History  Procedure Laterality Date  . Orchiectomy  1990    bilateral; ?neoplasm  . Repair hypospadias w/ urethroplasty    . Transurethral resection of prostate      for BPH  . Tonsillectomy and adenoidectomy    . A-v cardiac pacemaker insertion  1995  . Colonoscopy  2008  . Pacemaker insertion  05/2004; 10/04/2013    MDT dual chamber pacemaker; gen change 10/04/2013 (MDT ADDRL1)  . Permanent pacemaker generator change N/A 10/04/2013    Procedure: PERMANENT PACEMAKER GENERATOR CHANGE;  Surgeon: Evans Lance, MD;  Location: [facility] CATH LAB;  Service: Cardiovascular;  Laterality: N/A;  . Colonoscopy N/A 06/23/2015    [id number] diverticulosis  . Esophagogastroduodenoscopy N/A 06/23/2015    [id number] portable gastropathy     Current Outpatient Prescriptions  Medication Sig Dispense Refill  . albuterol (PROVENTIL) (2.5 MG/3ML) 0.083% nebulizer solution Take 2.5 mg by nebulization every 6 (six) hours as needed for wheezing  or shortness of breath.    Marland Kitchen albuterol (VENTOLIN HFA) 108 (90 BASE) MCG/ACT inhaler Inhale 2 puffs into the lungs every 6 (six) hours as needed for wheezing.     Marland Kitchen aspirin 325 MG EC tablet Take 325 mg by mouth daily.    . budesonide-formoterol (SYMBICORT) 160-4.5 MCG/ACT inhaler Inhale 2 puffs into the lungs 2 (two) times daily.    . cetirizine (ZYRTEC) 10 MG tablet Take 10 mg by mouth daily.    Marland Kitchen HUMALOG MIX 75/25 KWIKPEN (75-25) 100 UNIT/ML Kwikpen Inject 80 Units into the skin daily with breakfast. 90 units with supper    . levothyroxine (LEVOXYL) 50 MCG tablet Take 50 mcg by mouth daily.      Marland Kitchen losartan (COZAAR) 25 MG tablet  TAKE (1) TABLET BY MOUTH DAILY FOR HIGH BLOOD PRESSURE. 30 tablet 3  . metoprolol tartrate (LOPRESSOR) 25 MG tablet Take 1 tablet (25 mg total) by mouth 2 (two) times daily. (Patient taking differently: Take 25 mg by mouth daily. ) 60 tablet 11  . Misc Natural Products (OSTEO BI-FLEX TRIPLE STRENGTH PO) Take 1 tablet by mouth daily.    . modafinil (PROVIGIL) 200 MG tablet Take 200 mg by mouth daily.     . Multiple Vitamins-Minerals (CENTRUM) tablet Take 1 tablet by mouth daily.      . mupirocin ointment (BACTROBAN) 2 % Place 1 application into the nose 3 (three) times daily.    . nitroGLYCERIN (NITROSTAT) 0.4 MG SL tablet Place 1 tablet (0.4 mg total) under the tongue every 5 (five) minutes as needed for chest pain. 25 tablet 3  . Omega-3 Fatty Acids (FISH OIL) 1000 MG CAPS Take 1 capsule by mouth daily.    Marland Kitchen omeprazole (PRILOSEC) 40 MG capsule TAKE 1 CAPSULE BY MOUTH ONCE DAILY FOR REFLUX. 30 capsule 6  . polyethylene glycol powder (GLYCOLAX/MIRALAX) powder Take 17 g by mouth daily as needed for mild constipation. 255 g 0  . polyethylene glycol-electrolytes (NULYTELY/GOLYTELY) 420 G solution Take 4,000 mLs by mouth once. 4000 mL 0  . Psyllium (NATURAL FIBER PO) Take 6 capsules by mouth daily.    . Simethicone (GAS RELIEF 80 PO) Take 1 capsule by mouth daily as needed (gas).     . sodium chloride (OCEAN) 0.65 % SOLN nasal spray Place 1 spray into both nostrils as needed (nose bleeds).      No current facility-administered medications for this visit.    Allergies:   Ciprofloxacin; Eliquis; Enalapril; Metformin and related; and Penicillins    Social History:  The patient  reports that he has never smoked. He has never used smokeless tobacco. He reports that he does not drink alcohol or use illicit drugs.   Family History:  [name] family history includes COPD in his mother; Heart disease in his other; Hypertension in his mother and other; Pneumonia in his father.    ROS: All other  systems are reviewed and negative. Unless otherwise mentioned in H&P    PHYSICAL EXAM: VS:  Pulse 88  Ht 5\' 1"  (1.549 m)  Wt 195 lb (88.451 kg)  BMI 36.86 kg/m2  SpO2 95% , BMI Body mass index is 36.86 kg/(m^2). GEN: Well nourished, well developed, in no acute distress HEENT: normal Neck: no JVD, carotid bruits, or masses Cardiac: RRR; 2/6 systolic murmur,in her and and isrubs, or gallops,no edema  Respiratory:  clear to auscultation bilaterally, normal work of breathing GI: soft, nontender, nondistended, + BS MS: no deformity or atrophy Skin: warm and  dry, no rash Neuro:  Strength and sensation are intact. Hard of hearing wearing hearing aids. Psych: euthymic mood, full affect    Recent Labs: 09/01/2015: ALT 69*; BUN 16; Creat 0.83; Hemoglobin 15.7; Platelets 159; Potassium 4.8; Sodium 136    Lipid Panel    Component Value Date/Time   CHOL 141 05/19/2010 2137   TRIG 179* 05/19/2010 2137   HDL 33* 05/19/2010 2137   CHOLHDL 4.3 Ratio 05/19/2010 2137   VLDL 36 05/19/2010 2137   LDLCALC 72 05/19/2010 2137      Wt Readings from Last 3 Encounters:  01/26/16 195 lb (88.451 kg)  11/28/15 191 lb (86.637 kg)  10/13/15 194 lb (87.998 kg)      ASSESSMENT AND PLAN:  1. Aortic valve stenosis:we'll repeat echocardiogram for ongoing assessment of aortic valve. May need to be referred for repair. He is of low intellect and I am not certain how much she would understand concerning repair of the valve only that it would improve symptoms. He is currently without any identifiable symptoms. His wife is with him has not noticed any changes in his stamina or breathing. As though she was  2. Atrial Fib: heart rate is controlled. No issues with bleeding.   Current medicines are reviewed at length with the patient today.    Labs/ tests ordered today include:  No orders of the defined types were placed in this encounter.     Disposition:   FU with to be announced. We'll look at  echocardiogram results and have follow up appointment either with Dr. Harl Bowie or referral for TAVR. Will be seen in 6 months otherwise   Signed, Jory Sims, NP  01/26/2016 1:37 PM    [facility] [address] [place], [place] [address] Phone: [phone]; Fax: [phone]

## 2016-01-26 NOTE — Patient Instructions (Signed)
Medication Instructions:  Your physician recommends that you continue on your current medications as directed. Please refer to the Current Medication list given to you today.   Labwork: NONE  Testing/Procedures: Your physician has requested that you have an echocardiogram. Echocardiography is a painless test that uses sound waves to create images of your heart. It provides your doctor with information about the size and shape of your heart and how well your heart's chambers and valves are working. This procedure takes approximately one hour. There are no restrictions for this procedure.    Follow-Up: Your physician recommends that you schedule a follow-up appointment in: TO BE DETERMINED    Any Other Special Instructions Will Be Listed Below (If Applicable).     If you need a refill on your cardiac medications before your next appointment, please call your pharmacy.   

## 2016-02-02 ENCOUNTER — Ambulatory Visit (HOSPITAL_COMMUNITY)
Admission: RE | Admit: 2016-02-02 | Discharge: 2016-02-02 | Disposition: A | Discharge status: Home / Self Care | Payer: Medicare Other | Source: Ambulatory Visit | Attending: Adult Health | Admitting: Adult Health

## 2016-02-02 DIAGNOSIS — I350 Nonrheumatic aortic (valve) stenosis: Secondary | ICD-10-CM | POA: Insufficient documentation

## 2016-02-02 DIAGNOSIS — I119 Hypertensive heart disease without heart failure: Secondary | ICD-10-CM | POA: Insufficient documentation

## 2016-02-02 DIAGNOSIS — E119 Type 2 diabetes mellitus without complications: Secondary | ICD-10-CM | POA: Diagnosis not present

## 2016-02-02 DIAGNOSIS — E785 Hyperlipidemia, unspecified: Secondary | ICD-10-CM | POA: Insufficient documentation

## 2016-02-02 NOTE — Progress Notes (Signed)
*  PRELIMINARY RESULTS* Echocardiogram 2D Echocardiogram has been performed.  Leavy Cella 02/02/2016, 10:12 AM

## 2016-02-04 ENCOUNTER — Encounter: Payer: Self-pay | Admitting: Internal Medicine

## 2016-02-20 ENCOUNTER — Other Ambulatory Visit: Payer: Self-pay | Admitting: Adult Health

## 2016-02-29 ENCOUNTER — Observation Stay (HOSPITAL_COMMUNITY)
Admission: EM | Admit: 2016-02-29 | Discharge: 2016-03-03 | Disposition: A | Discharge status: Home / Self Care | Payer: Medicare Other | Attending: Internal Medicine | Admitting: Internal Medicine

## 2016-02-29 ENCOUNTER — Emergency Department (HOSPITAL_COMMUNITY): Payer: Medicare Other

## 2016-02-29 ENCOUNTER — Encounter (HOSPITAL_COMMUNITY): Payer: Self-pay | Admitting: Emergency Medicine

## 2016-02-29 DIAGNOSIS — I4892 Unspecified atrial flutter: Secondary | ICD-10-CM

## 2016-02-29 DIAGNOSIS — Z79899 Other long term (current) drug therapy: Secondary | ICD-10-CM | POA: Insufficient documentation

## 2016-02-29 DIAGNOSIS — K5289 Other specified noninfective gastroenteritis and colitis: Secondary | ICD-10-CM | POA: Diagnosis not present

## 2016-02-29 DIAGNOSIS — E11641 Type 2 diabetes mellitus with hypoglycemia with coma: Secondary | ICD-10-CM | POA: Diagnosis present

## 2016-02-29 DIAGNOSIS — I10 Essential (primary) hypertension: Secondary | ICD-10-CM | POA: Diagnosis not present

## 2016-02-29 DIAGNOSIS — E162 Hypoglycemia, unspecified: Secondary | ICD-10-CM

## 2016-02-29 DIAGNOSIS — R945 Abnormal results of liver function studies: Secondary | ICD-10-CM | POA: Diagnosis present

## 2016-02-29 DIAGNOSIS — J449 Chronic obstructive pulmonary disease, unspecified: Secondary | ICD-10-CM | POA: Insufficient documentation

## 2016-02-29 DIAGNOSIS — R109 Unspecified abdominal pain: Secondary | ICD-10-CM

## 2016-02-29 DIAGNOSIS — R188 Other ascites: Secondary | ICD-10-CM | POA: Insufficient documentation

## 2016-02-29 DIAGNOSIS — K529 Noninfective gastroenteritis and colitis, unspecified: Secondary | ICD-10-CM | POA: Diagnosis not present

## 2016-02-29 DIAGNOSIS — K7689 Other specified diseases of liver: Secondary | ICD-10-CM | POA: Diagnosis not present

## 2016-02-29 DIAGNOSIS — Z7982 Long term (current) use of aspirin: Secondary | ICD-10-CM | POA: Diagnosis not present

## 2016-02-29 DIAGNOSIS — R197 Diarrhea, unspecified: Secondary | ICD-10-CM | POA: Diagnosis present

## 2016-02-29 DIAGNOSIS — R1013 Epigastric pain: Secondary | ICD-10-CM | POA: Diagnosis not present

## 2016-02-29 DIAGNOSIS — E785 Hyperlipidemia, unspecified: Secondary | ICD-10-CM | POA: Insufficient documentation

## 2016-02-29 LAB — CBG MONITORING, ED
Glucose-Capillary: 91 mg/dL (ref 65–99)
Glucose-Capillary: 80 mg/dL (ref 65–99)

## 2016-02-29 LAB — CBC WITH DIFFERENTIAL/PLATELET
BASOS ABS: 0.1 10*3/uL (ref 0.0–0.1)
Basophils Relative: 1 %
EOS PCT: 1 %
Eosinophils Absolute: 0.1 10*3/uL (ref 0.0–0.7)
HCT: 47.7 % (ref 39.0–52.0)
Hemoglobin: 15.2 g/dL (ref 13.0–17.0)
LYMPHS PCT: 10 %
Lymphs Abs: 1.2 10*3/uL (ref 0.7–4.0)
MCH: 31.3 pg (ref 26.0–34.0)
MCHC: 31.9 g/dL (ref 30.0–36.0)
MCV: 98.1 fL (ref 78.0–100.0)
MONO ABS: 1.6 10*3/uL — AB (ref 0.1–1.0)
Monocytes Relative: 13 %
Neutro Abs: 9.3 10*3/uL — ABNORMAL HIGH (ref 1.7–7.7)
Neutrophils Relative %: 75 %
PLATELETS: 224 10*3/uL (ref 150–400)
RBC: 4.86 MIL/uL (ref 4.22–5.81)
RDW: 14.8 % (ref 11.5–15.5)
WBC: 12.3 10*3/uL — ABNORMAL HIGH (ref 4.0–10.5)

## 2016-02-29 LAB — COMPREHENSIVE METABOLIC PANEL
ALT: 84 U/L — AB (ref 17–63)
AST: 103 U/L — AB (ref 15–41)
Albumin: 3.2 g/dL — ABNORMAL LOW (ref 3.5–5.0)
Alkaline Phosphatase: 640 U/L — ABNORMAL HIGH (ref 38–126)
Anion gap: 7 (ref 5–15)
BILIRUBIN TOTAL: 1.6 mg/dL — AB (ref 0.3–1.2)
BUN: 19 mg/dL (ref 6–20)
CHLORIDE: 102 mmol/L (ref 101–111)
CO2: 30 mmol/L (ref 22–32)
CREATININE: 1.04 mg/dL (ref 0.61–1.24)
Calcium: 8.6 mg/dL — ABNORMAL LOW (ref 8.9–10.3)
GFR calc Af Amer: >60 mL/min (ref >60)
Glucose, Bld: 91 mg/dL (ref 65–99)
Potassium: 4.9 mmol/L (ref 3.5–5.1)
Sodium: 139 mmol/L (ref 135–145)
Total Protein: 6.8 g/dL (ref 6.5–8.1)

## 2016-02-29 LAB — URINALYSIS, ROUTINE W REFLEX MICROSCOPIC
BILIRUBIN URINE: NEGATIVE
GLUCOSE, UA: NEGATIVE mg/dL
HGB URINE DIPSTICK: NEGATIVE
KETONES UR: NEGATIVE mg/dL
Nitrite: NEGATIVE
Specific Gravity, Urine: 1.02 (ref 1.005–1.030)
pH: 6 (ref 5.0–8.0)

## 2016-02-29 LAB — LIPASE, BLOOD: LIPASE: 27 U/L (ref 11–51)

## 2016-02-29 LAB — GLUCOSE, CAPILLARY: GLUCOSE-CAPILLARY: 26 mg/dL — AB (ref 65–99)

## 2016-02-29 LAB — URINE MICROSCOPIC-ADD ON

## 2016-02-29 LAB — POC OCCULT BLOOD, ED: Fecal Occult Bld: POSITIVE — AB

## 2016-02-29 LAB — I-STAT CG4 LACTIC ACID, ED: Lactic Acid, Venous: 1.42 mmol/L (ref 0.5–2.0)

## 2016-02-29 LAB — I-STAT TROPONIN, ED: Troponin i, poc: 0.02 ng/mL (ref 0.00–0.08)

## 2016-02-29 MED ORDER — LEVOTHYROXINE SODIUM 50 MCG PO TABS
50.0000 ug | ORAL_TABLET | Freq: Every day | ORAL | Status: DC
  Administered 2016-03-01 – 2016-03-03 (×3): 50 ug via ORAL
  Filled 2016-02-29 (×3): qty 1

## 2016-02-29 MED ORDER — METRONIDAZOLE IN NACL 5-0.79 MG/ML-% IV SOLN
500.0000 mg | Freq: Once | INTRAVENOUS | Status: AC
  Administered 2016-02-29: 500 mg via INTRAVENOUS
  Filled 2016-02-29: qty 100

## 2016-02-29 MED ORDER — INSULIN ASPART 100 UNIT/ML ~~LOC~~ SOLN
0.0000 [IU] | Freq: Three times a day (TID) | SUBCUTANEOUS | Status: DC
  Administered 2016-03-01 (×2): 2 [IU] via SUBCUTANEOUS
  Administered 2016-03-02: 5 [IU] via SUBCUTANEOUS
  Administered 2016-03-02: 9 [IU] via SUBCUTANEOUS
  Administered 2016-03-02: 2 [IU] via SUBCUTANEOUS
  Administered 2016-03-03: 7 [IU] via SUBCUTANEOUS
  Administered 2016-03-03: 3 [IU] via SUBCUTANEOUS

## 2016-02-29 MED ORDER — NITROGLYCERIN 0.4 MG SL SUBL: 0.4000 mg | SUBLINGUAL_TABLET | SUBLINGUAL | Status: DC | PRN

## 2016-02-29 MED ORDER — PANTOPRAZOLE SODIUM 40 MG PO TBEC
80.0000 mg | DELAYED_RELEASE_TABLET | Freq: Every day | ORAL | Status: DC
  Administered 2016-03-01: 80 mg via ORAL
  Filled 2016-02-29: qty 2

## 2016-02-29 MED ORDER — SODIUM CHLORIDE 0.9% FLUSH
3.0000 mL | Freq: Two times a day (BID) | INTRAVENOUS | Status: DC
  Administered 2016-02-29 – 2016-03-02 (×3): 3 mL via INTRAVENOUS

## 2016-02-29 MED ORDER — ADULT MULTIVITAMIN W/MINERALS CH
1.0000 | ORAL_TABLET | Freq: Every day | ORAL | Status: DC
  Administered 2016-03-01 – 2016-03-03 (×3): 1 via ORAL
  Filled 2016-02-29 (×3): qty 1

## 2016-02-29 MED ORDER — SODIUM CHLORIDE 0.9 % IV SOLN
INTRAVENOUS | Status: DC
  Administered 2016-02-29: 16:00:00 via INTRAVENOUS

## 2016-02-29 MED ORDER — IOPAMIDOL (ISOVUE-300) INJECTION 61%
100.0000 mL | Freq: Once | INTRAVENOUS | Status: AC | PRN
  Administered 2016-02-29: 100 mL via INTRAVENOUS

## 2016-02-29 MED ORDER — MODAFINIL 200 MG PO TABS
200.0000 mg | ORAL_TABLET | Freq: Every day | ORAL | Status: DC
  Administered 2016-03-01 – 2016-03-03 (×3): 200 mg via ORAL
  Filled 2016-02-29 (×3): qty 1

## 2016-02-29 MED ORDER — METOPROLOL SUCCINATE ER 25 MG PO TB24
25.0000 mg | ORAL_TABLET | Freq: Every day | ORAL | Status: DC
  Administered 2016-03-01 – 2016-03-03 (×3): 25 mg via ORAL
  Filled 2016-02-29 (×3): qty 1

## 2016-02-29 MED ORDER — CEFEPIME HCL 2 G IJ SOLR
2.0000 g | Freq: Once | INTRAMUSCULAR | Status: AC
  Administered 2016-02-29: 2 g via INTRAVENOUS
  Filled 2016-02-29: qty 2

## 2016-02-29 MED ORDER — ALBUTEROL SULFATE (2.5 MG/3ML) 0.083% IN NEBU: 2.5000 mg | INHALATION_SOLUTION | Freq: Four times a day (QID) | RESPIRATORY_TRACT | Status: DC | PRN

## 2016-02-29 MED ORDER — ENOXAPARIN SODIUM 40 MG/0.4ML ~~LOC~~ SOLN
40.0000 mg | SUBCUTANEOUS | Status: DC
  Administered 2016-02-29 – 2016-03-02 (×3): 40 mg via SUBCUTANEOUS
  Filled 2016-02-29 (×3): qty 0.4

## 2016-02-29 MED ORDER — MUPIROCIN 2 % EX OINT
1.0000 "application " | TOPICAL_OINTMENT | Freq: Two times a day (BID) | CUTANEOUS | Status: DC
  Administered 2016-02-29 – 2016-03-03 (×5): 1 via NASAL
  Filled 2016-02-29 (×2): qty 22

## 2016-02-29 MED ORDER — DIATRIZOATE MEGLUMINE & SODIUM 66-10 % PO SOLN
ORAL | Status: AC
  Administered 2016-02-29: 30 mL
  Filled 2016-02-29: qty 30

## 2016-02-29 MED ORDER — FLUTICASONE PROPIONATE 50 MCG/ACT NA SUSP
2.0000 | Freq: Every day | NASAL | Status: DC
  Administered 2016-02-29 – 2016-03-03 (×4): 2 via NASAL
  Filled 2016-02-29: qty 16

## 2016-02-29 MED ORDER — ACETAMINOPHEN 650 MG RE SUPP: 650.0000 mg | Freq: Four times a day (QID) | RECTAL | Status: DC | PRN

## 2016-02-29 MED ORDER — ALBUTEROL SULFATE (2.5 MG/3ML) 0.083% IN NEBU: 3.0000 mL | INHALATION_SOLUTION | Freq: Four times a day (QID) | RESPIRATORY_TRACT | Status: DC | PRN

## 2016-02-29 MED ORDER — ACETAMINOPHEN 325 MG PO TABS: 650.0000 mg | ORAL_TABLET | Freq: Four times a day (QID) | ORAL | Status: DC | PRN

## 2016-02-29 MED ORDER — LORATADINE 10 MG PO TABS
10.0000 mg | ORAL_TABLET | Freq: Every day | ORAL | Status: DC
  Administered 2016-03-01 – 2016-03-03 (×3): 10 mg via ORAL
  Filled 2016-02-29 (×3): qty 1

## 2016-02-29 MED ORDER — SODIUM CHLORIDE 0.9 % IV BOLUS (SEPSIS)
1000.0000 mL | Freq: Once | INTRAVENOUS | Status: AC
  Administered 2016-02-29: 1000 mL via INTRAVENOUS

## 2016-02-29 MED ORDER — SODIUM CHLORIDE 0.9 % IV SOLN
INTRAVENOUS | Status: DC
  Administered 2016-02-29: 21:00:00 via INTRAVENOUS

## 2016-02-29 MED ORDER — ASPIRIN EC 325 MG PO TBEC
325.0000 mg | DELAYED_RELEASE_TABLET | Freq: Every day | ORAL | Status: DC
  Administered 2016-03-01 – 2016-03-03 (×3): 325 mg via ORAL
  Filled 2016-02-29 (×3): qty 1

## 2016-02-29 MED ORDER — METRONIDAZOLE IN NACL 5-0.79 MG/ML-% IV SOLN
500.0000 mg | Freq: Three times a day (TID) | INTRAVENOUS | Status: DC
  Administered 2016-03-01 – 2016-03-02 (×5): 500 mg via INTRAVENOUS
  Filled 2016-02-29 (×5): qty 100

## 2016-02-29 NOTE — ED Notes (Signed)
Pt taken to toilet. He had a bowel movement, but says "he still feels tight".

## 2016-02-29 NOTE — ED Provider Notes (Signed)
CSN: [id number]     Arrival date & time 02/29/16  1353 History   First MD Initiated Contact with Patient 02/29/16 1417     Chief Complaint  Patient presents with  . Hypoglycemia     (Consider location/radiation/quality/duration/timing/severity/associated sxs/prior Treatment) HPI   55 year old man is hard of hearing and much of the history is given by his wife presents today with complaints of increased abdominal pain. She states that he has been having ongoing abdominal pain has not been eating as much as usual. He has had his blood sugar dropped several times over the past week and has required EMS to come out and give IV glucose. He has complained of upper abdominal pain that is worse with eating. He has not had nausea or vomiting. His wife states that he is eating less than usual, however she states that today he has had coffee, orange juice, bacon, egg, and cheese biscuit, and a chicken leg and tomato sandwich for lunch. He has no previous history of abdominal surgery. There is been no fever or diarrhea. It is unclear when he had his last bowel movement.  Past Medical History  Diagnosis Date  . Chest pain 2007, 2009    cardiac cath > normal coronary arterie; nl left ventricular function  . Cardiac conduction disorder     status post pacemaker implantation in 1995 generator replaced 2005  . Aortic stenosis     mild  . Mitral regurgitation     insignificant  . HTN (hypertension)   . Hyperlipidemia     statin discontinued due to abn LFTs  . Syncope   . Obesity   . Pelvic mass     identified in 2009, stable 2010  . 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 Diabetes mellitus     +Insulin  . Benign hypertrophy of prostate     with urinary retention; repaired hypospadia; transurethral resection of the prostate   . GERD (gastroesophageal reflux disease)     status post multiple dilatations for stricture  . Congenital  deafness   . Sleep apnea     BiPAP and continuous oxygen  . Obesity 4/08    BMI= 40  . Anemia   . Alcohol use ([place])   . COPD (chronic obstructive pulmonary disease) ([place])   . Thyroid disease   . Edema   . Atrial flutter [facility])    Past Surgical History  Procedure Laterality Date  . Orchiectomy  1990    bilateral; ?neoplasm  . Repair hypospadias w/ urethroplasty    . Transurethral resection of prostate      for BPH  . Tonsillectomy and adenoidectomy    . A-v cardiac pacemaker insertion  1995  . Colonoscopy  2008  . Pacemaker insertion  05/2004; 10/04/2013    MDT dual chamber pacemaker; gen change 10/04/2013 (MDT ADDRL1)  . Permanent pacemaker generator change N/A 10/04/2013    Procedure: PERMANENT PACEMAKER GENERATOR CHANGE;  Surgeon: Evans Lance, MD;  Location: [facility] CATH LAB;  Service: Cardiovascular;  Laterality: N/A;  . Colonoscopy N/A 06/23/2015    [id number] diverticulosis  . Esophagogastroduodenoscopy N/A 06/23/2015    [id number] portable gastropathy   Family History  Problem Relation Age of Onset  . COPD Mother   . Hypertension Mother   . Pneumonia Father   . Hypertension Other   . Heart disease Other    Social History  Substance Use Topics  . Smoking  status: Never Smoker   . Smokeless tobacco: Never Used     Comment: tobacco use- no   . Alcohol Use: No    Review of Systems  All other systems reviewed and are negative.     Allergies  Ciprofloxacin; Eliquis; Enalapril; Metformin and related; and Penicillins  Home Medications   Prior to Admission medications   Medication Sig Start Date End Date Taking? Authorizing Provider  albuterol (PROVENTIL) (2.5 MG/3ML) 0.083% nebulizer solution Take 2.5 mg by nebulization every 6 (six) hours as needed for wheezing or shortness of breath.    Historical Provider, MD  albuterol (VENTOLIN HFA) 108 (90 BASE) MCG/ACT inhaler Inhale 2 puffs into the lungs every 6 (six) hours as needed for wheezing.     Historical Provider, MD   aspirin 325 MG EC tablet Take 325 mg by mouth daily.    Historical Provider, MD  budesonide-formoterol (SYMBICORT) 160-4.5 MCG/ACT inhaler Inhale 2 puffs into the lungs 2 (two) times daily.    Historical Provider, MD  cetirizine (ZYRTEC) 10 MG tablet Take 10 mg by mouth daily.    Historical Provider, MD  HUMALOG MIX 75/25 KWIKPEN (75-25) 100 UNIT/ML Kwikpen Inject 80 Units into the skin daily with breakfast. 90 units with supper 01/20/15   Historical Provider, MD  levothyroxine (LEVOXYL) 50 MCG tablet Take 50 mcg by mouth daily.      Historical Provider, MD  losartan (COZAAR) 25 MG tablet TAKE (1) TABLET BY MOUTH DAILY FOR HIGH BLOOD PRESSURE. 02/20/16   Lendon Colonel, NP  metoprolol tartrate (LOPRESSOR) 25 MG tablet Take 1 tablet (25 mg total) by mouth 2 (two) times daily. Patient taking differently: Take 25 mg by mouth daily.  06/26/15   Lendon Colonel, NP  Misc Natural Products (OSTEO BI-FLEX TRIPLE STRENGTH PO) Take 1 tablet by mouth daily.    Historical Provider, MD  modafinil (PROVIGIL) 200 MG tablet Take 200 mg by mouth daily.  01/13/15   Historical Provider, MD  Multiple Vitamins-Minerals (CENTRUM) tablet Take 1 tablet by mouth daily.      Historical Provider, MD  mupirocin ointment (BACTROBAN) 2 % Place 1 application into the nose 3 (three) times daily.    Historical Provider, MD  nitroGLYCERIN (NITROSTAT) 0.4 MG SL tablet Place 1 tablet (0.4 mg total) under the tongue every 5 (five) minutes as needed for chest pain. 06/26/15   Lendon Colonel, NP  Omega-3 Fatty Acids (FISH OIL) 1000 MG CAPS Take 1 capsule by mouth daily.    Historical Provider, MD  omeprazole (PRILOSEC) 40 MG capsule TAKE 1 CAPSULE BY MOUTH ONCE DAILY FOR REFLUX. 12/23/15   Arnoldo Lenis, MD  polyethylene glycol powder (GLYCOLAX/MIRALAX) powder Take 17 g by mouth daily as needed for mild constipation. 05/13/15   Tanna Furry, MD  polyethylene glycol-electrolytes (NULYTELY/GOLYTELY) 420 G solution Take 4,000 mLs by  mouth once. 06/02/15   Carlis Stable, NP  Psyllium (NATURAL FIBER PO) Take 6 capsules by mouth daily.    Historical Provider, MD  Simethicone (GAS RELIEF 80 PO) Take 1 capsule by mouth daily as needed (gas).     Historical Provider, MD  sodium chloride (OCEAN) 0.65 % SOLN nasal spray Place 1 spray into both nostrils as needed (nose bleeds).     Historical Provider, MD   BP 113/73 mmHg  Pulse 104  Temp(Src) 98.3 F (36.8 C) (Oral)  Resp 20  Ht 5' 3" (1.6 m)  Wt 86.682 kg  BMI 33.86 kg/m2  SpO2 93% Physical  Exam  Constitutional: He is oriented to person, place, and time. He appears well-developed and well-nourished. No distress.  HENT:  Head: Normocephalic and atraumatic.  Right Ear: External ear normal.  Left Ear: External ear normal.  Nose: Nose normal.  Tongue and mucous membranes are somewhat dry  Eyes: Conjunctivae and EOM are normal. Pupils are equal, round, and reactive to light.  Neck: Normal range of motion. Neck supple.  Cardiovascular: Normal rate and regular rhythm.   Pulmonary/Chest: Effort normal and breath sounds normal.  Abdominal: He exhibits no mass. There is no guarding.  Abdomen is distended. Tenderness to palpation is noted in the right upper quadrant and epigastrium. Sounds are present. Mild diffuse tenderness to palpation  Musculoskeletal: Normal range of motion.  Neurological: He is alert and oriented to person, place, and time. He has normal reflexes. No cranial nerve deficit. Coordination normal.  Skin: Skin is warm and dry.  Psychiatric: He has a normal mood and affect.  Nursing note and vitals reviewed.   ED Course  Procedures (including critical care time) Labs Review Labs Reviewed  CBC WITH DIFFERENTIAL/PLATELET - Abnormal; Notable for the following:    WBC 12.3 (*)    Neutro Abs 9.3 (*)    Monocytes Absolute 1.6 (*)    All other components within normal limits  POC OCCULT BLOOD, ED - Abnormal; Notable for the following:    Fecal Occult Bld  POSITIVE (*)    All other components within normal limits  COMPREHENSIVE METABOLIC PANEL  LIPASE, BLOOD  URINALYSIS, ROUTINE W REFLEX MICROSCOPIC (NOT AT [facility])  OCCULT BLOOD X 1 CARD TO LAB, STOOL  CBG MONITORING, ED  CBG MONITORING, ED  I-STAT TROPOININ, ED  I-STAT CG4 LACTIC ACID, ED    Imaging Review Ct Abdomen Pelvis W Contrast  02/29/2016  CLINICAL DATA:  Upper abdominal pain. History of bilateral orchectomy in 1990. Hypospadias repair with urethroplasty in the past. Previous transurethral resection of the prostate gland. EXAM: CT ABDOMEN AND PELVIS WITH CONTRAST TECHNIQUE: Multidetector CT imaging of the abdomen and pelvis was performed using the standard protocol following bolus administration of intravenous contrast. CONTRAST:  143m ISOVUE-300 IOPAMIDOL (ISOVUE-300) INJECTION 61% COMPARISON:  05/13/2015. FINDINGS: Lower chest: Prominent pulmonary vasculature. 6 mm nodule in the right lower lobe on image number 5 of series 6, confirmed on the sagittal and coronal reconstruction images. This was not included on 05/13/2015 and is stable since 12/18/2007. Hepatobiliary: Lobulated liver contours with a small right lobe, enlarged lateral segment left lobe and enlarged caudate lobe, with progression. The gallbladder is poorly visualized, most likely due to poor distention and surrounding free peritoneal fluid. Pancreas: The pancreatic tail remains somewhat short. No pancreatic mass seen. Spleen: Prominent in the axial plane, measuring 10.6 cm in length on coronal image number 103. Adrenals/Urinary Tract: Right renal cortical scarring is again demonstrated. Normal appearing left kidney, ureters and urinary bladder. Normal appearing adrenal glands. Stomach/Bowel: Mild diffuse wall thickening involving multiple loops of jejunum. No pneumatosis. The distal small bowel and colon are unremarkable. No gastric abnormalities. Normal appearing appendix. Vascular/Lymphatic: Enlarged heart. No aneurysm or  enlarged lymph nodes. Mild atheromatous arterial calcifications. The mesenteric arteries and veins are patent. Reproductive: Bicornuate uterus. No visible adnexal masses. The adnexal regions are partially obscured by free peritoneal fluid. Small or absent prostate gland. Other: Moderate amount of free peritoneal fluid. There is nodularity of the inferior aspect of the omentum. No peritoneal tumor implants seen elsewhere. No evidence of malignancy in the abdomen. Musculoskeletal: Mild  lumbar and lower thoracic spine degenerative changes. IMPRESSION: 1. Mild diffuse wall thickening involving multiple loops of jejunum. This could be due to inflammation or infection. The there are no findings to indicate that this represents ischemic bowel. 2. Progressive changes of cirrhosis of the liver with borderline splenomegaly. 3. Interval moderate amount of ascites, most likely due to the cirrhosis of the liver. 4. Probable vascular congestion in the omentum inferiorly. Omental spread of tumor is less likely based on reasons detailed above. 5. 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 Electronically Signed   By: Claudie Revering M.D.   On: 02/29/2016 16:47   Dg Chest Portable 1 View  02/29/2016  CLINICAL DATA:  Chest pain, weakness, atrial flutter EXAM: PORTABLE CHEST 1 VIEW COMPARISON:  05/13/2015 FINDINGS: Cardiomegaly again noted. Dual lead cardiac pacemaker is unchanged in position. No acute infiltrate or pleural effusion. No pulmonary edema. IMPRESSION: No active disease. Electronically Signed   By: Lahoma Crocker M.D.   On: 02/29/2016 15:06   I have personally reviewed and evaluated these images and lab results as part of my medical decision-making.   EKG Interpretation   Date/Time:  Sunday February 29 2016 14:18:41 EDT Ventricular Rate:  108 PR Interval:    QRS Duration: 99 QT Interval:  323 QTC Calculation: 433 R Axis:   -82 Text Interpretation:  Atrial flutter with varied AV  block, rbbb Low  voltage, precordial leads RSR' in V1 or V2, right VCD or RVH Repol abnrm,  severe global ischemia (LM/MVD) Confirmed by RAY MD, DANIELLE ([id number]) on  02/29/2016 3:52:16 PM      MDM     1- abdominal pain- wall thickening jejunum- ? Infection-plan tx with cefipime and flagyl 2- atrial flutter- stable 3-elevated alk phos-cirrhosis, alk phos up t 640 4- ?hermaphrodite 5- ascites  Discussed with Dr. Maudie Mercury- plan tele bed.   Pattricia Boss, MD 02/29/16 1910

## 2016-02-29 NOTE — H&P (Addendum)
TRH H&P   Patient Demographics:    [name], is a 55 y.o. male  MRN: [id number]   DOB - Feb 06, 1961  Admit Date - 02/29/2016  Outpatient Primary MD for the patient is Petra Kuba, MD  Referring MD/NP/PA: Pryor Curia  Outpatient Specialists: Sydell Axon (GI), Branch (cardiology)  Patient coming from: home  Chief Complaint  Patient presents with  . Hypoglycemia      HPI:    [name]  is a 55 y.o. male, w/ Dm2, Cirrhosis, Aflutter, Aortic stenosis, c/o abdominal pain, starting today.  Epigastric, ? Constipation.   denies fever, chills, n/v, diarrhea, brbpr, black stool.  bs was low at 50.  Pt was brought to ER by family due to hypoglycemia.    In ER ct scan showed ? Mild thickening of the jejunum.  ? Infectious.  Pt will be admitted for possible jejunitis and also hypoglycemia     Review of systems:    In addition to the HPI above, No Fever-chills, No Headache, No changes with Vision or hearing, No problems swallowing food or Liquids, No Chest pain, Cough or Shortness of Breath, No Nausea or Vommitting, Bowel movements are regular, ?constipation,   No Blood in stool or Urine, No dysuria, No new skin rashes or bruises, No new joints pains-aches,  No new weakness, tingling, numbness in any extremity, No recent weight gain or loss, No polyuria, polydypsia or polyphagia, No significant Mental Stressors.  A full 10 point Review of Systems was done, except as stated above, all other Review of Systems were negative.   With Past History of the following :    Past Medical History  Diagnosis Date  . Chest pain 2007, 2009    cardiac cath > normal coronary arterie; nl left ventricular function  . Cardiac conduction disorder     status post pacemaker implantation in 1995 generator replaced 2005  . Aortic stenosis     mild  . Mitral regurgitation    insignificant  . HTN (hypertension)   . Hyperlipidemia     statin discontinued due to abn LFTs  . Syncope   . Obesity   . Pelvic mass     identified in 2009, stable 2010  . 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 Diabetes mellitus     +Insulin  . Benign hypertrophy of prostate     with urinary retention; repaired hypospadia; transurethral resection of the prostate   . GERD (gastroesophageal reflux disease)     status post multiple dilatations for stricture  . Congenital deafness   . Sleep apnea     BiPAP and continuous oxygen  . Obesity 4/08    BMI= 40  . Anemia   . Alcohol use ([place])   . COPD (chronic obstructive pulmonary disease) ([place])   . Thyroid disease   .  Edema   . Atrial flutter [facility])       Past Surgical History  Procedure Laterality Date  . Orchiectomy  1990    bilateral; ?neoplasm  . Repair hypospadias w/ urethroplasty    . Transurethral resection of prostate      for BPH  . Tonsillectomy and adenoidectomy    . A-v cardiac pacemaker insertion  1995  . Colonoscopy  2008  . Pacemaker insertion  05/2004; 10/04/2013    MDT dual chamber pacemaker; gen change 10/04/2013 (MDT ADDRL1)  . Permanent pacemaker generator change N/A 10/04/2013    Procedure: PERMANENT PACEMAKER GENERATOR CHANGE;  Surgeon: Evans Lance, MD;  Location: [facility] CATH LAB;  Service: Cardiovascular;  Laterality: N/A;  . Colonoscopy N/A 06/23/2015    [id number] diverticulosis  . Esophagogastroduodenoscopy N/A 06/23/2015    [id number] portable gastropathy      Social History:     Social History  Substance Use Topics  . Smoking status: Never Smoker   . Smokeless tobacco: Never Used     Comment: tobacco use- no   . Alcohol Use: No     Lives -  At home  Mobility - walks by self     Family History :     Family History  Problem Relation Age of Onset  . COPD Mother   . Hypertension Mother   . Pneumonia Father   . Hypertension  Other   . Heart disease Other   . Congestive Heart Failure Mother       Home Medications:   Prior to Admission medications   Medication Sig Start Date End Date Taking? Authorizing Provider  albuterol (PROVENTIL) (2.5 MG/3ML) 0.083% nebulizer solution Take 2.5 mg by nebulization every 6 (six) hours as needed for wheezing or shortness of breath.   Yes Historical Provider, MD  albuterol (VENTOLIN HFA) 108 (90 BASE) MCG/ACT inhaler Inhale 2 puffs into the lungs every 6 (six) hours as needed for wheezing.    Yes Historical Provider, MD  aspirin 325 MG EC tablet Take 325 mg by mouth daily.   Yes Historical Provider, MD  cetirizine (ZYRTEC) 10 MG tablet Take 10 mg by mouth daily.   Yes Historical Provider, MD  Glucosamine-Chondroit-Vit C-Mn (GLUCOSAMINE CHONDR 1500 COMPLX PO) Take 1 tablet by mouth every morning.   Yes Historical Provider, MD  HUMALOG MIX 75/25 KWIKPEN (75-25) 100 UNIT/ML Kwikpen Inject 80 Units into the skin daily with breakfast. 90 units with supper 01/20/15  Yes Historical Provider, MD  levothyroxine (LEVOXYL) 50 MCG tablet Take 50 mcg by mouth daily.     Yes Historical Provider, MD  metoprolol succinate (TOPROL-XL) 25 MG 24 hr tablet Take 25 mg by mouth daily.   Yes Historical Provider, MD  modafinil (PROVIGIL) 200 MG tablet Take 200 mg by mouth daily.  01/13/15  Yes Historical Provider, MD  mometasone (NASONEX) 50 MCG/ACT nasal spray Place 2 sprays into the nose daily as needed (FOR CONGESTION/ALLERGIES).   Yes Historical Provider, MD  Multiple Vitamins-Minerals (CENTRUM) tablet Take 1 tablet by mouth daily.     Yes Historical Provider, MD  mupirocin ointment (BACTROBAN) 2 % Place 1 application into the nose 2 (two) times daily.    Yes Historical Provider, MD  nitroGLYCERIN (NITROSTAT) 0.4 MG SL tablet Place 1 tablet (0.4 mg total) under the tongue every 5 (five) minutes as needed for chest pain. 06/26/15  Yes Lendon Colonel, NP  Omega-3 Fatty Acids (FISH OIL) 1000 MG CAPS Take  1 capsule by mouth daily.  Yes Historical Provider, MD  omeprazole (PRILOSEC) 40 MG capsule TAKE 1 CAPSULE BY MOUTH ONCE DAILY FOR REFLUX. 12/23/15  Yes Arnoldo Lenis, MD     Allergies:     Allergies  Allergen Reactions  . Ciprofloxacin     Itching and red rash up arm when infusing after 3rd cipro dose for uti on 10/27/13  . Eliquis [Apixaban] Other (See Comments)    bleeding  . Enalapril Other (See Comments)    Cough  . Metformin And Related     Severe diaarhea  . Penicillins Other (See Comments)    Reaction unspecified     Physical Exam:   Vitals  Blood pressure 147/93, pulse 106, temperature 98.1 F (36.7 C), temperature source Oral, resp. rate 21, height 5\' 3"  (1.6 m), weight 86.682 kg (191 lb 1.6 oz), SpO2 92 %.   1. General lying in bed in NAD,    2. Normal affect and insight, Not Suicidal or Homicidal, Awake Alert, Oriented X 3.  3. No F.N deficits, ALL C.Nerves Intact, Strength 5/5 all 4 extremities, Sensation intact all 4 extremities, Plantars down going.  4. Ears and Eyes appear Normal, Conjunctivae clear, PERRLA. Moist Oral Mucosa.  5. Supple Neck, No JVD, No cervical lymphadenopathy appriciated, No Carotid Bruits.  6. Symmetrical Chest wall movement, Good air movement bilaterally, CTAB.  7. RRR, No Gallops, Rubs. 2/6 sem rusb, Murmurs, No Parasternal Heave.  8. Positive Bowel Sounds, Abdomen Soft, slight distention, No tenderness, No organomegaly appriciated,No rebound -guarding or rigidity.  9.  No Cyanosis, Normal Skin Turgor, No Skin Rash or Bruise.  10. Good muscle tone,  joints appear normal , no effusions, Normal ROM.  11. No Palpable Lymph Nodes in Neck or Axillae    Data Review:    CBC  Recent Labs Lab 02/29/16 1435  WBC 12.3*  HGB 15.2  HCT 47.7  PLT 224  MCV 98.1  MCH 31.3  MCHC 31.9  RDW 14.8  LYMPHSABS 1.2  MONOABS 1.6*  EOSABS 0.1  BASOSABS 0.1    ------------------------------------------------------------------------------------------------------------------  Chemistries   Recent Labs Lab 02/29/16 1435  NA 139  K 4.9  CL 102  CO2 30  GLUCOSE 91  BUN 19  CREATININE 1.04  CALCIUM 8.6*  AST 103*  ALT 84*  ALKPHOS 640*  BILITOT 1.6*   ------------------------------------------------------------------------------------------------------------------ estimated creatinine clearance is 78.1 mL/min (by C-G formula based on Cr of 1.04). ------------------------------------------------------------------------------------------------------------------ No results for input(s): TSH, T4TOTAL, T3FREE, THYROIDAB in the last 72 hours.  Invalid input(s): FREET3  Coagulation profile No results for input(s): INR, PROTIME in the last 168 hours. ------------------------------------------------------------------------------------------------------------------- No results for input(s): DDIMER in the last 72 hours. -------------------------------------------------------------------------------------------------------------------  Cardiac Enzymes No results for input(s): CKMB, TROPONINI, MYOGLOBIN in the last 168 hours.  Invalid input(s): CK ------------------------------------------------------------------------------------------------------------------ No results found for: BNP   ---------------------------------------------------------------------------------------------------------------  Urinalysis    Component Value Date/Time   COLORURINE YELLOW 02/29/2016 [place] 02/29/2016 1517   LABSPEC 1.020 02/29/2016 1517   PHURINE 6.0 02/29/2016 1517   GLUCOSEU NEGATIVE 02/29/2016 1517   HGBUR NEGATIVE 02/29/2016 1517   BILIRUBINUR NEGATIVE 02/29/2016 1517   KETONESUR NEGATIVE 02/29/2016 1517   PROTEINUR TRACE* 02/29/2016 1517   UROBILINOGEN 0.2 05/13/2015 1315   NITRITE NEGATIVE 02/29/2016 1517   LEUKOCYTESUR  TRACE* 02/29/2016 1517    ----------------------------------------------------------------------------------------------------------------   Imaging Results:    Ct Abdomen Pelvis W Contrast  02/29/2016  CLINICAL DATA:  Upper abdominal pain. History of bilateral orchectomy in 1990. Hypospadias  repair with urethroplasty in the past. Previous transurethral resection of the prostate gland. EXAM: CT ABDOMEN AND PELVIS WITH CONTRAST TECHNIQUE: Multidetector CT imaging of the abdomen and pelvis was performed using the standard protocol following bolus administration of intravenous contrast. CONTRAST:  112mL ISOVUE-300 IOPAMIDOL (ISOVUE-300) INJECTION 61% COMPARISON:  05/13/2015. FINDINGS: Lower chest: Prominent pulmonary vasculature. 6 mm nodule in the right lower lobe on image number 5 of series 6, confirmed on the sagittal and coronal reconstruction images. This was not included on 05/13/2015 and is stable since 12/18/2007. Hepatobiliary: Lobulated liver contours with a small right lobe, enlarged lateral segment left lobe and enlarged caudate lobe, with progression. The gallbladder is poorly visualized, most likely due to poor distention and surrounding free peritoneal fluid. Pancreas: The pancreatic tail remains somewhat short. No pancreatic mass seen. Spleen: Prominent in the axial plane, measuring 10.6 cm in length on coronal image number 103. Adrenals/Urinary Tract: Right renal cortical scarring is again demonstrated. Normal appearing left kidney, ureters and urinary bladder. Normal appearing adrenal glands. Stomach/Bowel: Mild diffuse wall thickening involving multiple loops of jejunum. No pneumatosis. The distal small bowel and colon are unremarkable. No gastric abnormalities. Normal appearing appendix. Vascular/Lymphatic: Enlarged heart. No aneurysm or enlarged lymph nodes. Mild atheromatous arterial calcifications. The mesenteric arteries and veins are patent. Reproductive: Bicornuate uterus. No visible  adnexal masses. The adnexal regions are partially obscured by free peritoneal fluid. Small or absent prostate gland. Other: Moderate amount of free peritoneal fluid. There is nodularity of the inferior aspect of the omentum. No peritoneal tumor implants seen elsewhere. No evidence of malignancy in the abdomen. Musculoskeletal: Mild lumbar and lower thoracic spine degenerative changes. IMPRESSION: 1. Mild diffuse wall thickening involving multiple loops of jejunum. This could be due to inflammation or infection. The there are no findings to indicate that this represents ischemic bowel. 2. Progressive changes of cirrhosis of the liver with borderline splenomegaly. 3. Interval moderate amount of ascites, most likely due to the cirrhosis of the liver. 4. Probable vascular congestion in the omentum inferiorly. Omental spread of tumor is less likely based on reasons detailed above. 5. 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 Electronically Signed   By: Claudie Revering M.D.   On: 02/29/2016 16:47   Dg Chest Portable 1 View  02/29/2016  CLINICAL DATA:  Chest pain, weakness, atrial flutter EXAM: PORTABLE CHEST 1 VIEW COMPARISON:  05/13/2015 FINDINGS: Cardiomegaly again noted. Dual lead cardiac pacemaker is unchanged in position. No acute infiltrate or pleural effusion. No pulmonary edema. IMPRESSION: No active disease. Electronically Signed   By: Lahoma Crocker M.D.   On: 02/29/2016 15:06      Assessment & Plan:    Active Problems:   Diarrhea    1. Hypoglycemia Monitor fsbs q4h  2. ? Diarrhea Stool studies for fecal leukocytes, culture, c. Diff  3.  ? Jejunal wall thickening Flagyl  Gi consult ordered in computer.   4. Gerd Cont PPI  5.  Dm2 Fsbs,  Hold medication due to hypoglycemia  6.  Aflutter Cont Metoprolol  7.  Hypothyroidism Check tsh  8.  Abnormal lft Check AMA   DVT Prophylaxis Heparin -  Lovenox - SCD  AM Labs Ordered, also please review  Full Orders  Family Communication: Admission, patients condition and plan of care including tests being ordered have been discussed with the patient  who indicate understanding and agree with the plan and Code Status.  Code Status Full Code  Likely DC to  home  Condition GUARDED   Consults called: Gi consulted in computer  Admission status: observation  Time spent in minutes : 45   Jani Gravel M.D on 02/29/2016 at 7:44 PM  Between 7am to 7pm - Pager - [phone]. After 7pm go to [email or web address] - password [facility]  [facility] - Office  [phone]

## 2016-02-29 NOTE — ED Notes (Signed)
CBG 91 

## 2016-02-29 NOTE — ED Notes (Addendum)
PT family reports hypoglycemic episodes since 02/18/16 with decreased appetite. PT states states upper abdominal pains that started this am with no n/v/d. Wife reports last insulin was 60units of Humalog at 0730.

## 2016-02-29 NOTE — ED Notes (Signed)
Call to lab re TSH add on

## 2016-02-29 NOTE — ED Notes (Signed)
Denies any pain. Pt has had episodes of dizziness.

## 2016-03-01 ENCOUNTER — Encounter: Payer: Medicare Other | Admitting: *Deleted

## 2016-03-01 ENCOUNTER — Telehealth: Payer: Self-pay | Admitting: Cardiology

## 2016-03-01 ENCOUNTER — Encounter (HOSPITAL_COMMUNITY): Payer: Self-pay | Admitting: Gastroenterology

## 2016-03-01 DIAGNOSIS — R197 Diarrhea, unspecified: Secondary | ICD-10-CM

## 2016-03-01 DIAGNOSIS — R188 Other ascites: Secondary | ICD-10-CM | POA: Insufficient documentation

## 2016-03-01 DIAGNOSIS — R1013 Epigastric pain: Secondary | ICD-10-CM | POA: Diagnosis not present

## 2016-03-01 DIAGNOSIS — K7689 Other specified diseases of liver: Secondary | ICD-10-CM

## 2016-03-01 LAB — CBC
HCT: 45.8 % (ref 39.0–52.0)
HEMOGLOBIN: 14.7 g/dL (ref 13.0–17.0)
MCH: 31.4 pg (ref 26.0–34.0)
MCHC: 32.1 g/dL (ref 30.0–36.0)
MCV: 97.9 fL (ref 78.0–100.0)
PLATELETS: 182 10*3/uL (ref 150–400)
RBC: 4.68 MIL/uL (ref 4.22–5.81)
RDW: 15.1 % (ref 11.5–15.5)
WBC: 9.6 10*3/uL (ref 4.0–10.5)

## 2016-03-01 LAB — GLUCOSE, CAPILLARY
GLUCOSE-CAPILLARY: 108 mg/dL — AB (ref 65–99)
GLUCOSE-CAPILLARY: 188 mg/dL — AB (ref 65–99)
GLUCOSE-CAPILLARY: 213 mg/dL — AB (ref 65–99)
GLUCOSE-CAPILLARY: 68 mg/dL (ref 65–99)
Glucose-Capillary: 55 mg/dL — ABNORMAL LOW (ref 65–99)
Glucose-Capillary: 186 mg/dL — ABNORMAL HIGH (ref 65–99)
Glucose-Capillary: 169 mg/dL — ABNORMAL HIGH (ref 65–99)
Glucose-Capillary: 144 mg/dL — ABNORMAL HIGH (ref 65–99)

## 2016-03-01 LAB — COMPREHENSIVE METABOLIC PANEL
ALK PHOS: 567 U/L — AB (ref 38–126)
ALT: 74 U/L — ABNORMAL HIGH (ref 17–63)
ANION GAP: 6 (ref 5–15)
AST: 89 U/L — ABNORMAL HIGH (ref 15–41)
Albumin: 2.9 g/dL — ABNORMAL LOW (ref 3.5–5.0)
BILIRUBIN TOTAL: 1.8 mg/dL — AB (ref 0.3–1.2)
BUN: 14 mg/dL (ref 6–20)
CALCIUM: 8.3 mg/dL — AB (ref 8.9–10.3)
CO2: 28 mmol/L (ref 22–32)
CREATININE: 0.7 mg/dL (ref 0.61–1.24)
Chloride: 104 mmol/L (ref 101–111)
Glucose, Bld: 87 mg/dL (ref 65–99)
Potassium: 4.6 mmol/L (ref 3.5–5.1)
SODIUM: 138 mmol/L (ref 135–145)
TOTAL PROTEIN: 6.1 g/dL — AB (ref 6.5–8.1)

## 2016-03-01 LAB — C DIFFICILE QUICK SCREEN W PCR REFLEX
C DIFFICILE (CDIFF) INTERP: NEGATIVE
C DIFFICILE (CDIFF) TOXIN: NEGATIVE
C DIFFICLE (CDIFF) ANTIGEN: NEGATIVE

## 2016-03-01 LAB — TSH: TSH: 1.297 u[IU]/mL (ref 0.350–4.500)

## 2016-03-01 MED ORDER — DEXTROSE 5 % IV SOLN
2.0000 g | INTRAVENOUS | Status: DC
  Administered 2016-03-01 – 2016-03-02 (×2): 2 g via INTRAVENOUS
  Filled 2016-03-01 (×3): qty 2

## 2016-03-01 MED ORDER — DEXTROSE 50 % IV SOLN
50.0000 mL | Freq: Once | INTRAVENOUS | Status: AC
  Administered 2016-03-01: 50 mL via INTRAVENOUS

## 2016-03-01 MED ORDER — DEXTROSE 50 % IV SOLN
INTRAVENOUS | Status: AC
  Administered 2016-03-01: 50 mL
  Filled 2016-03-01: qty 50

## 2016-03-01 MED ORDER — DEXTROSE 50 % IV SOLN
INTRAVENOUS | Status: AC
  Filled 2016-03-01: qty 50

## 2016-03-01 MED ORDER — PANTOPRAZOLE SODIUM 40 MG PO TBEC
40.0000 mg | DELAYED_RELEASE_TABLET | Freq: Every day | ORAL | Status: DC
  Administered 2016-03-02 – 2016-03-03 (×2): 40 mg via ORAL
  Filled 2016-03-01 (×2): qty 1

## 2016-03-01 MED ORDER — GLUCOSE 40 % PO GEL
ORAL | Status: AC
  Administered 2016-03-01: 37.5 g
  Filled 2016-03-01: qty 1

## 2016-03-01 MED ORDER — KCL IN DEXTROSE-NACL 20-5-0.9 MEQ/L-%-% IV SOLN
INTRAVENOUS | Status: DC
  Administered 2016-03-01 – 2016-03-02 (×3): via INTRAVENOUS

## 2016-03-01 NOTE — Progress Notes (Addendum)
Hypoglycemic Event  CBG: 26  Treatment: 25 mg of D50 IV per protocol @ 0015  Symptoms: none  Follow-up CBG: Time: 12:40 CBG Result: 188  Possible Reasons for Event: Pt NPO  Comments/MD notified: Notified Midlevel MD     Carola Frost Sequoya Hogsett

## 2016-03-01 NOTE — Consult Note (Signed)
Referring Provider: Dr. Maudie Mercury  Primary Care Physician:  Petra Kuba, MD Primary Gastroenterologist:  Dr. Gala Romney   Date of Admission: 02/29/16 Date of Consultation: 03/01/16  Reason for Consultation:  Abnormal LFTs and jejunitis   HPI:  DONTERRIOUS REDUS is a 55 y.o. 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 Colonoscopy/EGD up-to-date as of Oct 2016 with findings of portal colopathy, colonic diverticulosis, tubular adenoma, EGD with mild portal gastropathy and no esophageal varices.   Presented with abdominal pain, onset yesterday. Associated diarrhea. Abdominal pain improved but still with diarrhea. Poor historian. Some tissue hematochezia. Multiple loose, watery stools. No exposure to antibiotics. No sick contacts. Bottled water. Has been dealing with constipation as a baseline. States that loose stool is improving, less frequent and smaller amounts.   CT abd/pelvis with contrast noting mild diffuse wall thickening involving multiple loops of jejunum, interval moderate amount of ascites most likely due to cirrhosis, patent mesenteric arteries and veins. Elevated LFTs mixed pattern now improving.   Prior cirrhosis work-up: AFP, ANA, ASMA negative. Hep B and C negative in 2009.   Past Medical History  Diagnosis Date  . Chest pain 2007, 2009    cardiac cath > normal coronary arterie; nl left ventricular function  . Cardiac conduction disorder     status post pacemaker implantation in 1995 generator replaced 2005  . Aortic stenosis     mild  . Mitral regurgitation     insignificant  . HTN (hypertension)   . Hyperlipidemia     statin discontinued due to abn LFTs  . Syncope   . Obesity   . Pelvic mass     identified in 2009, stable 2010  . 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 Diabetes mellitus     +Insulin  . Benign hypertrophy of prostate     with urinary retention;  repaired hypospadia; transurethral resection of the prostate   . GERD (gastroesophageal reflux disease)     status post multiple dilatations for stricture  . Congenital deafness   . Sleep apnea     BiPAP and continuous oxygen  . Obesity 4/08    BMI= 40  . Anemia   . Alcohol use ([place])   . COPD (chronic obstructive pulmonary disease) ([place])   . Thyroid disease   . Edema   . Atrial flutter [facility])     Past Surgical History  Procedure Laterality Date  . Orchiectomy  1990    bilateral; ?neoplasm  . Repair hypospadias w/ urethroplasty    . Transurethral resection of prostate      for BPH  . Tonsillectomy and adenoidectomy    . A-v cardiac pacemaker insertion  1995  . Colonoscopy  2008  . Pacemaker insertion  05/2004; 10/04/2013    MDT dual chamber pacemaker; gen change 10/04/2013 (MDT ADDRL1)  . Permanent pacemaker generator change N/A 10/04/2013    Procedure: PERMANENT PACEMAKER GENERATOR CHANGE;  Surgeon: Evans Lance, MD;  Location: [facility] CATH LAB;  Service: Cardiovascular;  Laterality: N/A;  . Colonoscopy N/A 06/23/2015    [id number] diverticulosis  . Esophagogastroduodenoscopy N/A 06/23/2015    [id number] portal gastropathy    Prior to Admission medications   Medication Sig Start Date End Date Taking? Authorizing Provider  albuterol (PROVENTIL) (2.5 MG/3ML) 0.083% nebulizer solution Take 2.5 mg by nebulization every 6 (six) hours as needed for wheezing or shortness of breath.  Yes Historical Provider, MD  albuterol (VENTOLIN HFA) 108 (90 BASE) MCG/ACT inhaler Inhale 2 puffs into the lungs every 6 (six) hours as needed for wheezing.    Yes Historical Provider, MD  aspirin 325 MG EC tablet Take 325 mg by mouth daily.   Yes Historical Provider, MD  cetirizine (ZYRTEC) 10 MG tablet Take 10 mg by mouth daily.   Yes Historical Provider, MD  Glucosamine-Chondroit-Vit C-Mn (GLUCOSAMINE CHONDR 1500 COMPLX PO) Take 1 tablet by mouth every morning.   Yes Historical Provider, MD  HUMALOG MIX  75/25 KWIKPEN (75-25) 100 UNIT/ML Kwikpen Inject 80 Units into the skin daily with breakfast. 90 units with supper 01/20/15  Yes Historical Provider, MD  levothyroxine (LEVOXYL) 50 MCG tablet Take 50 mcg by mouth daily.     Yes Historical Provider, MD  metoprolol succinate (TOPROL-XL) 25 MG 24 hr tablet Take 25 mg by mouth daily.   Yes Historical Provider, MD  modafinil (PROVIGIL) 200 MG tablet Take 200 mg by mouth daily.  01/13/15  Yes Historical Provider, MD  mometasone (NASONEX) 50 MCG/ACT nasal spray Place 2 sprays into the nose daily as needed (FOR CONGESTION/ALLERGIES).   Yes Historical Provider, MD  Multiple Vitamins-Minerals (CENTRUM) tablet Take 1 tablet by mouth daily.     Yes Historical Provider, MD  mupirocin ointment (BACTROBAN) 2 % Place 1 application into the nose 2 (two) times daily.    Yes Historical Provider, MD  nitroGLYCERIN (NITROSTAT) 0.4 MG SL tablet Place 1 tablet (0.4 mg total) under the tongue every 5 (five) minutes as needed for chest pain. 06/26/15  Yes Lendon Colonel, NP  Omega-3 Fatty Acids (FISH OIL) 1000 MG CAPS Take 1 capsule by mouth daily.   Yes Historical Provider, MD  omeprazole (PRILOSEC) 40 MG capsule TAKE 1 CAPSULE BY MOUTH ONCE DAILY FOR REFLUX. 12/23/15  Yes Arnoldo Lenis, MD    Current Facility-Administered Medications  Medication Dose Route Frequency Provider Last Rate Last Dose  . 0.9 %  sodium chloride infusion   Intravenous Continuous Pattricia Boss, MD 125 mL/hr at 02/29/16 1548    . acetaminophen (TYLENOL) tablet 650 mg  650 mg Oral Q6H PRN Jani Gravel, MD       Or  . acetaminophen (TYLENOL) suppository 650 mg  650 mg Rectal Q6H PRN Jani Gravel, MD      . albuterol (PROVENTIL) (2.5 MG/3ML) 0.083% nebulizer solution 2.5 mg  2.5 mg Nebulization Q6H PRN Jani Gravel, MD      . aspirin EC tablet 325 mg  325 mg Oral Daily Jani Gravel, MD   325 mg at 03/01/16 1002  . cefTRIAXone (ROCEPHIN) 2 g in dextrose 5 % 50 mL IVPB  2 g Intravenous Q24H Reyne Dumas, MD   2  g at 03/01/16 1007  . dextrose 5 % and 0.9 % NaCl with KCl 20 mEq/L infusion   Intravenous Continuous Reyne Dumas, MD 75 mL/hr at 03/01/16 1322    . enoxaparin (LOVENOX) injection 40 mg  40 mg Subcutaneous Q24H Jani Gravel, MD   40 mg at 02/29/16 2203  . fluticasone (FLONASE) 50 MCG/ACT nasal spray 2 spray  2 spray Each Nare Daily Jani Gravel, MD   2 spray at 03/01/16 1001  . insulin aspart (novoLOG) injection 0-9 Units  0-9 Units Subcutaneous TID WC Jani Gravel, MD   2 Units at 03/01/16 1321  . levothyroxine (SYNTHROID, LEVOTHROID) tablet 50 mcg  50 mcg Oral QAC breakfast Jani Gravel, MD   50 mcg at 03/01/16  [id number]  . loratadine (CLARITIN) tablet 10 mg  10 mg Oral Daily Jani Gravel, MD   10 mg at 03/01/16 1001  . metoprolol succinate (TOPROL-XL) 24 hr tablet 25 mg  25 mg Oral Daily Jani Gravel, MD   25 mg at 03/01/16 1001  . metroNIDAZOLE (FLAGYL) IVPB 500 mg  500 mg Intravenous Q8H Jani Gravel, MD   500 mg at 03/01/16 1322  . modafinil (PROVIGIL) tablet 200 mg  200 mg Oral Daily Jani Gravel, MD   200 mg at 03/01/16 1001  . multivitamin with minerals tablet 1 tablet  1 tablet Oral Daily Jani Gravel, MD   1 tablet at 03/01/16 1001  . mupirocin ointment (BACTROBAN) 2 % 1 application  1 application Nasal BID Jani Gravel, MD   1 application at [id number]  . nitroGLYCERIN (NITROSTAT) SL tablet 0.4 mg  0.4 mg Sublingual Q5 min PRN Jani Gravel, MD      . pantoprazole (PROTONIX) EC tablet 80 mg  80 mg Oral Daily Jani Gravel, MD   80 mg at 03/01/16 1001  . sodium chloride flush (NS) 0.9 % injection 3 mL  3 mL Intravenous Q12H Jani Gravel, MD   3 mL at 02/29/16 2200    Allergies as of 02/29/2016 - Review Complete 02/29/2016  Allergen Reaction Noted  . Ciprofloxacin  10/27/2013  . Eliquis [apixaban] Other (See Comments) 03/18/2014  . Enalapril Other (See Comments) 04/15/2011  . Metformin and related  07/30/2013  . Penicillins Other (See Comments)     Family History  Problem Relation Age of Onset  . COPD Mother   .  Hypertension Mother   . Pneumonia Father   . Hypertension Other   . Heart disease Other   . Congestive Heart Failure Mother   . Colon cancer Neg Hx     Social History   Social History  . Marital Status: Married    Spouse Name: N/A  . Number of Children: N/A  . Years of Education: N/A   Occupational History  . Not on file.   Social History Main Topics  . Smoking status: Never Smoker   . Smokeless tobacco: Never Used     Comment: tobacco use- no   . Alcohol Use: No  . Drug Use: No  . Sexual Activity: Yes   Other Topics Concern  . Not on file   Social History Narrative   Part time.     Review of Systems: Gen: +chills  CV: Denies chest pain, heart palpitations, syncope, edema  Resp: Denies shortness of breath with rest, cough, wheezing GI: see HPI  GU : Denies urinary burning, urinary frequency, urinary incontinence.  MS: Denies joint pain,swelling, cramping Derm: Denies rash, itching, dry skin Psych: Denies depression, anxiety,confusion, or memory loss Heme: Denies bruising, bleeding, and enlarged lymph nodes.  Physical Exam: Vital signs in last 24 hours: Temp:  [97.6 F (36.4 C)-97.9 F (36.6 C)] 97.9 F (36.6 C) (06/12 1556) Pulse Rate:  [78-106] 102 (06/12 1556) Resp:  [19-28] 20 (06/12 1556) BP: (130-161)/(86-110) 140/86 mmHg (06/12 1556) SpO2:  [90 %-96 %] 96 % (06/12 1556) Weight:  [198 lb 12.8 oz (90.175 kg)] 198 lb 12.8 oz (90.175 kg) (06/11 2043)   General:   Alert, appears older than stated age Head:  Normocephalic and atraumatic. Eyes:  Sclera clear, no icterus.   Conjunctiva pink. Ears:  HARD OF HEARING  Nose:  No deformity, discharge,  or lesions. Mouth:  No deformity or lesions, dentition normal. Lungs:  Clear throughout to auscultation.   No wheezes, crackles, or rhonchi. No acute distress. Heart:  S1 S2 present without murmurs  Abdomen:  Round, obese, non-tense , no rebound or guarding. Difficult to appreciate HSM due to large AP diameter.  Anasarca noted as well.    Rectal:  Deferred until time of colonoscopy.   Msk:  Symmetrical without gross deformities. Normal posture. Pulses:  Normal pulses noted. Extremities:  Without clubbing or edema. Neurologic:  Alert and  oriented x4;  grossly normal neurologically. Skin:  Intact without significant lesions or rashes. Cervical Nodes:  No significant cervical adenopathy. Psych:  Alert and cooperative. Normal mood and affect.  Intake/Output from previous day:   Intake/Output this shift:    Lab Results:  Recent Labs  02/29/16 1435 03/01/16 0515  WBC 12.3* 9.6  HGB 15.2 14.7  HCT 47.7 45.8  PLT 224 182   BMET  Recent Labs  02/29/16 1435 03/01/16 0515  NA 139 138  K 4.9 4.6  CL 102 104  CO2 30 28  GLUCOSE 91 87  BUN 19 14  CREATININE 1.04 0.70  CALCIUM 8.6* 8.3*   LFT  Recent Labs  02/29/16 1435 03/01/16 0515  PROT 6.8 6.1*  ALBUMIN 3.2* 2.9*  AST 103* 89*  ALT 84* 74*  ALKPHOS 640* 567*  BILITOT 1.6* 1.8*   PT/INR No results for input(s): LABPROT, INR in the last 72 hours. Hepatitis Panel No results for input(s): HEPBSAG, HCVAB, HEPAIGM, HEPBIGM in the last 72 hours. C-Diff No results for input(s): CDIFFTOX in the last 72 hours.  Studies/Results: Ct Abdomen Pelvis W Contrast  02/29/2016  CLINICAL DATA:  Upper abdominal pain. History of bilateral orchectomy in 1990. Hypospadias repair with urethroplasty in the past. Previous transurethral resection of the prostate gland. EXAM: CT ABDOMEN AND PELVIS WITH CONTRAST TECHNIQUE: Multidetector CT imaging of the abdomen and pelvis was performed using the standard protocol following bolus administration of intravenous contrast. CONTRAST:  124mL ISOVUE-300 IOPAMIDOL (ISOVUE-300) INJECTION 61% COMPARISON:  05/13/2015. FINDINGS: Lower chest: Prominent pulmonary vasculature. 6 mm nodule in the right lower lobe on image number 5 of series 6, confirmed on the sagittal and coronal reconstruction images. This was not  included on 05/13/2015 and is stable since 12/18/2007. Hepatobiliary: Lobulated liver contours with a small right lobe, enlarged lateral segment left lobe and enlarged caudate lobe, with progression. The gallbladder is poorly visualized, most likely due to poor distention and surrounding free peritoneal fluid. Pancreas: The pancreatic tail remains somewhat short. No pancreatic mass seen. Spleen: Prominent in the axial plane, measuring 10.6 cm in length on coronal image number 103. Adrenals/Urinary Tract: Right renal cortical scarring is again demonstrated. Normal appearing left kidney, ureters and urinary bladder. Normal appearing adrenal glands. Stomach/Bowel: Mild diffuse wall thickening involving multiple loops of jejunum. No pneumatosis. The distal small bowel and colon are unremarkable. No gastric abnormalities. Normal appearing appendix. Vascular/Lymphatic: Enlarged heart. No aneurysm or enlarged lymph nodes. Mild atheromatous arterial calcifications. The mesenteric arteries and veins are patent. Reproductive: Bicornuate uterus. No visible adnexal masses. The adnexal regions are partially obscured by free peritoneal fluid. Small or absent prostate gland. Other: Moderate amount of free peritoneal fluid. There is nodularity of the inferior aspect of the omentum. No peritoneal tumor implants seen elsewhere. No evidence of malignancy in the abdomen. Musculoskeletal: Mild lumbar and lower thoracic spine degenerative changes. IMPRESSION: 1. Mild diffuse wall thickening involving multiple loops of jejunum. This could be due to inflammation or infection. The there are no findings  to indicate that this represents ischemic bowel. 2. Progressive changes of cirrhosis of the liver with borderline splenomegaly. 3. Interval moderate amount of ascites, most likely due to the cirrhosis of the liver. 4. Probable vascular congestion in the omentum inferiorly. Omental spread of tumor is less likely based on reasons detailed  above. 5. 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 Electronically Signed   By: Claudie Revering M.D.   On: 02/29/2016 16:47   Dg Chest Portable 1 View  02/29/2016  CLINICAL DATA:  Chest pain, weakness, atrial flutter EXAM: PORTABLE CHEST 1 VIEW COMPARISON:  05/13/2015 FINDINGS: Cardiomegaly again noted. Dual lead cardiac pacemaker is unchanged in position. No acute infiltrate or pleural effusion. No pulmonary edema. IMPRESSION: No active disease. Electronically Signed   By: Lahoma Crocker M.D.   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 CT noting mild diffuse wall thickening involving multiple loops of jejunum and further findings as above. As of note, vasculature is patent. Clinically, he has improved from a diarrheal standpoint with supportive measures and empiric antibiotics. Abdominal pain resolved. LFTs appear to be around his baseline, and small bump during admission likely secondary to acute illness. Autoimmune serologies on file from several months ago, with Hep B and C negative in 2009. Will check AMA now, along with further serologies to wrap up evaluation for cirrhosis etiology. Up-to-date on variceal screening. Will continue to follow with you.  Plan: Await stool studies Continue empiric antibiotics Advance to soft diet Check further serologies to wrap up cirrhosis evaluation Follow LFTs   Orvil Feil, ANP-BC [facility]         03/01/2016, 3:57 PM

## 2016-03-01 NOTE — Progress Notes (Signed)
[facility] PROGRESS NOTE  ENMANUEL RAUM [id number] DOB: [birth year] DOA: 02/29/2016   PCP: Petra Kuba, MD     Assessment/Plan: Principal Problem:   Abdominal pain Active Problems:   Diarrhea   Abnormal liver function   Atrial flutter (HCC)   Hypoglycemia   Ascites    55 year old man is hard of hearing and much of the history is given by his wife presents today with complaints of increased abdominal pain. She states that he has been having ongoing abdominal pain has not been eating as much as usual. He has had his blood sugar dropped several times over the past week and has required EMS to come out and give IV glucose. He has complained of upper abdominal pain that is worse with eating. He has not had nausea or vomiting. CT abdomen pelvis with contrast shows thickening of loops of jejunum. Inflammation versus infection. Patient admitted for suspected enteritis.   Assessment and plan    1. Hypoglycemia-suspect secondary to poor oral intake and continued usage of insulin 75 x 25. Patient was taking 60 units up until yesterday Now started on D5 and has  2. ? Diarrhea Stool studies ordered Patient continues to have diarrhea this morning Advance to soft diet  3. ? Jejunal wall thickening-suspected enteritis Continue IV Flagyl and Rocephin UA negative   4. Gerd Cont PPI  5. Dm2 Hold insulin, Hold medication due to hypoglycemia, continue Accu-Cheks DC D5 if CBG is greater than 250  6. Aflutter Cont Metoprolol  7. Hypothyroidism TSH normal  8. Abnormal lft Check AMA    DVT prophylaxsis  Lovenox   Code Status:   full code     Family Communication: Discussed in detail with the patient, all imaging results, lab results explained to the patient   Disposition Plan:  Anticipate discharge tomorrow      Consultation  None    Procedures:  None   Antibiotics: Anti-infectives    Start     Dose/Rate Route Frequency Ordered  Stop   03/01/16 0900  cefTRIAXone (ROCEPHIN) 2 g in dextrose 5 % 50 mL IVPB     2 g 100 mL/hr over 30 Minutes Intravenous Every 24 hours 03/01/16 0822     03/01/16 0400  metroNIDAZOLE (FLAGYL) IVPB 500 mg     500 mg 100 mL/hr over 60 Minutes Intravenous Every 8 hours 02/29/16 2045     02/29/16 1845  ceFEPIme (MAXIPIME) 2 g in dextrose 5 % 50 mL IVPB     2 g 100 mL/hr over 30 Minutes Intravenous  Once 02/29/16 1837 02/29/16 1936   02/29/16 1845  metroNIDAZOLE (FLAGYL) IVPB 500 mg     500 mg 100 mL/hr over 60 Minutes Intravenous  Once 02/29/16 1837 02/29/16 2025         HPI/Sub Patient having diarrhea this morning, denies any abdominal pain nausea vomiting   Objective: Filed Vitals:   02/29/16 1930 02/29/16 2043 03/01/16 0001 03/01/16 0533  BP: 143/110 161/97  153/98  Pulse: 83 98 78 99  Temp:  97.6 F (36.4 C)  97.9 F (36.6 C)  TempSrc:  Oral  Oral  Resp: 24 20 20 20   Height:      Weight:  90.175 kg (198 lb 12.8 oz)    SpO2: 90% 91% 93% 96%   No intake or output data in the 24 hours ending 03/01/16 1020  Exam:  Examination:  General exam: Appears calm and comfortable  Respiratory system: Clear to  auscultation. Respiratory effort normal. Cardiovascular system: S1 & S2 heard, RRR. No JVD, murmurs, rubs, gallops or clicks. No pedal edema. Gastrointestinal system: Abdomen is nondistended, soft and nontender. No organomegaly or masses felt. Normal bowel sounds heard. Central nervous system: Alert and oriented. No focal neurological deficits. Extremities: Symmetric 5 x 5 power. Skin: No rashes, lesions or ulcers Psychiatry: Judgement and insight appear normal. Mood & affect appropriate.     Data Reviewed: I have personally reviewed following labs and imaging studies  Micro Results No results found for this or any previous visit (from the past 240 hour(s)).  Radiology Reports Ct Abdomen Pelvis W Contrast  02/29/2016  CLINICAL DATA:  Upper abdominal pain. History  of bilateral orchectomy in 1990. Hypospadias repair with urethroplasty in the past. Previous transurethral resection of the prostate gland. EXAM: CT ABDOMEN AND PELVIS WITH CONTRAST TECHNIQUE: Multidetector CT imaging of the abdomen and pelvis was performed using the standard protocol following bolus administration of intravenous contrast. CONTRAST:  129mL ISOVUE-300 IOPAMIDOL (ISOVUE-300) INJECTION 61% COMPARISON:  05/13/2015. FINDINGS: Lower chest: Prominent pulmonary vasculature. 6 mm nodule in the right lower lobe on image number 5 of series 6, confirmed on the sagittal and coronal reconstruction images. This was not included on 05/13/2015 and is stable since 12/18/2007. Hepatobiliary: Lobulated liver contours with a small right lobe, enlarged lateral segment left lobe and enlarged caudate lobe, with progression. The gallbladder is poorly visualized, most likely due to poor distention and surrounding free peritoneal fluid. Pancreas: The pancreatic tail remains somewhat short. No pancreatic mass seen. Spleen: Prominent in the axial plane, measuring 10.6 cm in length on coronal image number 103. Adrenals/Urinary Tract: Right renal cortical scarring is again demonstrated. Normal appearing left kidney, ureters and urinary bladder. Normal appearing adrenal glands. Stomach/Bowel: Mild diffuse wall thickening involving multiple loops of jejunum. No pneumatosis. The distal small bowel and colon are unremarkable. No gastric abnormalities. Normal appearing appendix. Vascular/Lymphatic: Enlarged heart. No aneurysm or enlarged lymph nodes. Mild atheromatous arterial calcifications. The mesenteric arteries and veins are patent. Reproductive: Bicornuate uterus. No visible adnexal masses. The adnexal regions are partially obscured by free peritoneal fluid. Small or absent prostate gland. Other: Moderate amount of free peritoneal fluid. There is nodularity of the inferior aspect of the omentum. No peritoneal tumor implants seen  elsewhere. No evidence of malignancy in the abdomen. Musculoskeletal: Mild lumbar and lower thoracic spine degenerative changes. IMPRESSION: 1. Mild diffuse wall thickening involving multiple loops of jejunum. This could be due to inflammation or infection. The there are no findings to indicate that this represents ischemic bowel. 2. Progressive changes of cirrhosis of the liver with borderline splenomegaly. 3. Interval moderate amount of ascites, most likely due to the cirrhosis of the liver. 4. Probable vascular congestion in the omentum inferiorly. Omental spread of tumor is less likely based on reasons detailed above. 5. 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 Electronically Signed   By: Claudie Revering M.D.   On: 02/29/2016 16:47   Dg Chest Portable 1 View  02/29/2016  CLINICAL DATA:  Chest pain, weakness, atrial flutter EXAM: PORTABLE CHEST 1 VIEW COMPARISON:  05/13/2015 FINDINGS: Cardiomegaly again noted. Dual lead cardiac pacemaker is unchanged in position. No acute infiltrate or pleural effusion. No pulmonary edema. IMPRESSION: No active disease. Electronically Signed   By: Lahoma Crocker M.D.   On: 02/29/2016 15:06     CBC  Recent Labs Lab 02/29/16 1435 03/01/16 0515  WBC 12.3* 9.6  HGB 15.2 14.7  HCT 47.7 45.8  PLT 224 182  MCV 98.1 97.9  MCH 31.3 31.4  MCHC 31.9 32.1  RDW 14.8 15.1  LYMPHSABS 1.2  --   MONOABS 1.6*  --   EOSABS 0.1  --   BASOSABS 0.1  --     Chemistries   Recent Labs Lab 02/29/16 1435 03/01/16 0515  NA 139 138  K 4.9 4.6  CL 102 104  CO2 30 28  GLUCOSE 91 87  BUN 19 14  CREATININE 1.04 0.70  CALCIUM 8.6* 8.3*  AST 103* 89*  ALT 84* 74*  ALKPHOS 640* 567*  BILITOT 1.6* 1.8*   ------------------------------------------------------------------------------------------------------------------ estimated creatinine clearance is 103.6 mL/min (by C-G formula based on Cr of  0.7). ------------------------------------------------------------------------------------------------------------------ No results for input(s): HGBA1C in the last 72 hours. ------------------------------------------------------------------------------------------------------------------ No results for input(s): CHOL, HDL, LDLCALC, TRIG, CHOLHDL, LDLDIRECT in the last 72 hours. ------------------------------------------------------------------------------------------------------------------  Recent Labs  03/01/16 0515  TSH 1.297   ------------------------------------------------------------------------------------------------------------------ No results for input(s): VITAMINB12, FOLATE, FERRITIN, TIBC, IRON, RETICCTPCT in the last 72 hours.  Coagulation profile No results for input(s): INR, PROTIME in the last 168 hours.  No results for input(s): DDIMER in the last 72 hours.  Cardiac Enzymes No results for input(s): CKMB, TROPONINI, MYOGLOBIN in the last 168 hours.  Invalid input(s): CK ------------------------------------------------------------------------------------------------------------------ Invalid input(s): POCBNP   CBG:  Recent Labs Lab 03/01/16 0043 03/01/16 0337 03/01/16 0722 03/01/16 0813 03/01/16 0843  GLUCAP 188* 108* 55* 68 186*       Studies: Ct Abdomen Pelvis W Contrast  02/29/2016  CLINICAL DATA:  Upper abdominal pain. History of bilateral orchectomy in 1990. Hypospadias repair with urethroplasty in the past. Previous transurethral resection of the prostate gland. EXAM: CT ABDOMEN AND PELVIS WITH CONTRAST TECHNIQUE: Multidetector CT imaging of the abdomen and pelvis was performed using the standard protocol following bolus administration of intravenous contrast. CONTRAST:  14mL ISOVUE-300 IOPAMIDOL (ISOVUE-300) INJECTION 61% COMPARISON:  05/13/2015. FINDINGS: Lower chest: Prominent pulmonary vasculature. 6 mm nodule in the right lower lobe on image  number 5 of series 6, confirmed on the sagittal and coronal reconstruction images. This was not included on 05/13/2015 and is stable since 12/18/2007. Hepatobiliary: Lobulated liver contours with a small right lobe, enlarged lateral segment left lobe and enlarged caudate lobe, with progression. The gallbladder is poorly visualized, most likely due to poor distention and surrounding free peritoneal fluid. Pancreas: The pancreatic tail remains somewhat short. No pancreatic mass seen. Spleen: Prominent in the axial plane, measuring 10.6 cm in length on coronal image number 103. Adrenals/Urinary Tract: Right renal cortical scarring is again demonstrated. Normal appearing left kidney, ureters and urinary bladder. Normal appearing adrenal glands. Stomach/Bowel: Mild diffuse wall thickening involving multiple loops of jejunum. No pneumatosis. The distal small bowel and colon are unremarkable. No gastric abnormalities. Normal appearing appendix. Vascular/Lymphatic: Enlarged heart. No aneurysm or enlarged lymph nodes. Mild atheromatous arterial calcifications. The mesenteric arteries and veins are patent. Reproductive: Bicornuate uterus. No visible adnexal masses. The adnexal regions are partially obscured by free peritoneal fluid. Small or absent prostate gland. Other: Moderate amount of free peritoneal fluid. There is nodularity of the inferior aspect of the omentum. No peritoneal tumor implants seen elsewhere. No evidence of malignancy in the abdomen. Musculoskeletal: Mild lumbar and lower thoracic spine degenerative changes. IMPRESSION: 1. Mild diffuse wall thickening involving multiple loops of jejunum. This could be due to inflammation or infection. The there are no findings to indicate that this represents ischemic bowel. 2. Progressive changes of  cirrhosis of the liver with borderline splenomegaly. 3. Interval moderate amount of ascites, most likely due to the cirrhosis of the liver. 4. Probable vascular congestion  in the omentum inferiorly. Omental spread of tumor is less likely based on reasons detailed above. 5. 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 Electronically Signed   By: Claudie Revering M.D.   On: 02/29/2016 16:47   Dg Chest Portable 1 View  02/29/2016  CLINICAL DATA:  Chest pain, weakness, atrial flutter EXAM: PORTABLE CHEST 1 VIEW COMPARISON:  05/13/2015 FINDINGS: Cardiomegaly again noted. Dual lead cardiac pacemaker is unchanged in position. No acute infiltrate or pleural effusion. No pulmonary edema. IMPRESSION: No active disease. Electronically Signed   By: Lahoma Crocker M.D.   On: 02/29/2016 15:06      Lab Results  Component Value Date   HGBA1C 10.3* 10/26/2013   HGBA1C 6.8 01/27/2010   HGBA1C * 12/19/2007    7.1 (NOTE)   The ADA recommends the following therapeutic goals for glycemic   control related to Hgb A1C measurement:   Goal of Therapy:   < 7.0% Hgb A1C   Action Suggested:  > 8.0% Hgb A1C   Ref:  Diabetes Care, 22, Suppl. 1, 1999   Lab Results  Component Value Date   LDLCALC 72 05/19/2010   CREATININE 0.70 03/01/2016       Scheduled Meds: . aspirin  325 mg Oral Daily  . cefTRIAXone (ROCEPHIN)  IV  2 g Intravenous Q24H  . enoxaparin (LOVENOX) injection  40 mg Subcutaneous Q24H  . fluticasone  2 spray Each Nare Daily  . insulin aspart  0-9 Units Subcutaneous TID WC  . levothyroxine  50 mcg Oral QAC breakfast  . loratadine  10 mg Oral Daily  . metoprolol succinate  25 mg Oral Daily  . metronidazole  500 mg Intravenous Q8H  . modafinil  200 mg Oral Daily  . multivitamin with minerals  1 tablet Oral Daily  . mupirocin ointment  1 application Nasal BID  . pantoprazole  80 mg Oral Daily  . sodium chloride flush  3 mL Intravenous Q12H   Continuous Infusions: . sodium chloride 125 mL/hr at 02/29/16 1548  . dextrose 5 % and 0.9 % NaCl with KCl 20 mEq/L 50 mL/hr at 03/01/16 0844        Time spent: >30 MINS    [facility]  [facility] Pager [phone]. If 7PM-7AM, please contact night-coverage at [email or web address], password [facility] 03/01/2016, 10:20 AM

## 2016-03-01 NOTE — Telephone Encounter (Signed)
LMOVM reminding pt to send remote transmission.   

## 2016-03-01 NOTE — Care Management Obs Status (Signed)
[place] NOTIFICATION   Patient Details  Name: [name] MRN: [id number] Date of Birth: 1961-03-19   Medicare Observation Status Notification Given:  Yes    Sherald Barge, RN 03/01/2016, 1:13 PM

## 2016-03-01 NOTE — Progress Notes (Signed)
CRITICAL VALUE ALERT  Critical value received:  CBG 55  Date of notification:  03/01/2016  Time of notification:  [id number]  Critical value read back:Yes.    Nurse who received alert:  Radene Gunning  MD notified (1st page):  Dr Allyson Sabal  Time of first page:  [phone]  MD notified (2nd page):  Time of second page:  Responding MD:  Awaiting   Time MD responded:  Awaiting

## 2016-03-01 NOTE — Care Management Note (Signed)
Case Management Note  Patient Details  Name: [name] MRN: [id number] Date of Birth: 04/07/1961  Subjective/Objective:                  Admitted with abd pain. Pt is from home, lives alone and is ind with ADL's. Pt's wife at bedside. Pt has no assistive devices and no HH services prior to admission. Pt has PCP, transportation to appointments and no difficulty affording his medications. Pt plans to return home with self care   Action/Plan: No CM needs anticipated.   Expected Discharge Date:  03/03/16               Expected Discharge Plan:  Home/Self Care  In-House Referral:  NA  Discharge planning Services  CM Consult  Post Acute Care Choice:  NA Choice offered to:  NA  DME Arranged:    DME Agency:     HH Arranged:    HH Agency:     Status of Service:  Completed, signed off  Medicare Important Message Given:    Date Medicare IM Given:    Medicare IM give by:    Date Additional Medicare IM Given:    Additional Medicare Important Message give by:     If discussed at [place] of Stay Meetings, dates discussed:    Additional Comments:  Sherald Barge, RN 03/01/2016, 1:13 PM

## 2016-03-02 ENCOUNTER — Telehealth: Payer: Self-pay | Admitting: Gastroenterology

## 2016-03-02 ENCOUNTER — Telehealth: Payer: Self-pay | Admitting: Internal Medicine

## 2016-03-02 DIAGNOSIS — R188 Other ascites: Secondary | ICD-10-CM | POA: Diagnosis not present

## 2016-03-02 DIAGNOSIS — R1013 Epigastric pain: Secondary | ICD-10-CM | POA: Diagnosis not present

## 2016-03-02 DIAGNOSIS — K7689 Other specified diseases of liver: Secondary | ICD-10-CM | POA: Diagnosis not present

## 2016-03-02 LAB — GASTROINTESTINAL PANEL BY PCR, STOOL (REPLACES STOOL CULTURE)

## 2016-03-02 LAB — COMPREHENSIVE METABOLIC PANEL
ALT: 69 U/L — AB (ref 17–63)
AST: 68 U/L — AB (ref 15–41)
Albumin: 3.1 g/dL — ABNORMAL LOW (ref 3.5–5.0)
Alkaline Phosphatase: 548 U/L — ABNORMAL HIGH (ref 38–126)
Anion gap: 7 (ref 5–15)
BILIRUBIN TOTAL: 1.8 mg/dL — AB (ref 0.3–1.2)
BUN: 12 mg/dL (ref 6–20)
CHLORIDE: 101 mmol/L (ref 101–111)
CO2: 28 mmol/L (ref 22–32)
CREATININE: 0.81 mg/dL (ref 0.61–1.24)
Calcium: 8.5 mg/dL — ABNORMAL LOW (ref 8.9–10.3)
Glucose, Bld: 153 mg/dL — ABNORMAL HIGH (ref 65–99)
POTASSIUM: 4.4 mmol/L (ref 3.5–5.1)
Sodium: 136 mmol/L (ref 135–145)
TOTAL PROTEIN: 6.4 g/dL — AB (ref 6.5–8.1)

## 2016-03-02 LAB — GLUCOSE, CAPILLARY
GLUCOSE-CAPILLARY: 370 mg/dL — AB (ref 65–99)
GLUCOSE-CAPILLARY: 212 mg/dL — AB (ref 65–99)
Glucose-Capillary: 189 mg/dL — ABNORMAL HIGH (ref 65–99)
Glucose-Capillary: 257 mg/dL — ABNORMAL HIGH (ref 65–99)

## 2016-03-02 LAB — IRON AND TIBC
IRON: 40 ug/dL — AB (ref 45–182)
Saturation Ratios: 14 % — ABNORMAL LOW (ref 17.9–39.5)
TIBC: 280 ug/dL (ref 250–450)
UIBC: 240 ug/dL

## 2016-03-02 LAB — HEMOGLOBIN A1C
Hgb A1c MFr Bld: 6.2 % — ABNORMAL HIGH (ref 4.8–5.6)
Mean Plasma Glucose: 131 mg/dL

## 2016-03-02 LAB — CBC
HCT: 48.8 % (ref 39.0–52.0)
HEMOGLOBIN: 15.5 g/dL (ref 13.0–17.0)
MCH: 31.1 pg (ref 26.0–34.0)
MCHC: 31.8 g/dL (ref 30.0–36.0)
MCV: 98 fL (ref 78.0–100.0)
PLATELETS: 198 10*3/uL (ref 150–400)
RBC: 4.98 MIL/uL (ref 4.22–5.81)
RDW: 14.9 % (ref 11.5–15.5)
WBC: 9.5 10*3/uL (ref 4.0–10.5)

## 2016-03-02 LAB — FERRITIN: FERRITIN: 139 ng/mL (ref 24–336)

## 2016-03-02 MED ORDER — METRONIDAZOLE 500 MG PO TABS
500.0000 mg | ORAL_TABLET | Freq: Three times a day (TID) | ORAL | Status: DC
  Administered 2016-03-02 – 2016-03-03 (×2): 500 mg via ORAL
  Filled 2016-03-02 (×2): qty 1

## 2016-03-02 MED ORDER — HUMALOG MIX 75/25 KWIKPEN (75-25) 100 UNIT/ML ~~LOC~~ SUPN: PEN_INJECTOR | SUBCUTANEOUS | Status: DC

## 2016-03-02 NOTE — Telephone Encounter (Signed)
Please arrange outpatient hospital follow-up with me or Randall Hiss in 4-6 weeks.

## 2016-03-02 NOTE — Telephone Encounter (Signed)
HOSPITAL CALLED THIS MORNING AND SAID FU 1 WEEK WITH RMR.  HE IS ON FOR NEXT Tuesday

## 2016-03-02 NOTE — Plan of Care (Signed)
Problem: Activity: Goal: Risk for activity intolerance will decrease Outcome: Progressing Patient assisted up to bedside commode, chair, and sitting on side of bed today. Bedalarm in place for safety.   Problem: Bowel/Gastric: Goal: Will not experience complications related to bowel motility Outcome: Progressing Patient having loose stools today. MD notified. Enteric precautions in place.

## 2016-03-02 NOTE — Telephone Encounter (Signed)
New Message   Pt wife call stating pt was suppose to send a pacer check in on 6/12, pt is currently in the hospital. Pt wife requesting to speak with RN to discuss next instructions. Please call back to discuss

## 2016-03-02 NOTE — Telephone Encounter (Signed)
Spoke with wife- she says Mr. Rathel is at [facility] with a virus and he may be home by the end of the week. I let her know we would reschedule his remote transmission for Monday 03/08/16. She is agreeable.

## 2016-03-02 NOTE — Discharge Summary (Signed)
Physician Discharge Summary  [name] MRN: [id number] DOB/AGE: 09-30-1960 55 y.o.  PCP: Petra Kuba, MD   Admit date: 02/29/2016 Discharge date: 03/02/2016  Discharge Diagnoses:     Principal Problem:   Abdominal pain Active Problems:   Diarrhea   Abnormal liver function   Atrial flutter (HCC)   Hypoglycemia   Ascites    Follow-up recommendations Follow-up with PCP in 3-5 days , including all  additional recommended appointments as below Follow-up CBC, CMP in 3-5 days       Current Discharge Medication List    CONTINUE these medications which have NOT CHANGED   Details  albuterol (PROVENTIL) (2.5 MG/3ML) 0.083% nebulizer solution Take 2.5 mg by nebulization every 6 (six) hours as needed for wheezing or shortness of breath.    albuterol (VENTOLIN HFA) 108 (90 BASE) MCG/ACT inhaler Inhale 2 puffs into the lungs every 6 (six) hours as needed for wheezing.     aspirin 325 MG EC tablet Take 325 mg by mouth daily.    cetirizine (ZYRTEC) 10 MG tablet Take 10 mg by mouth daily.    Glucosamine-Chondroit-Vit C-Mn (GLUCOSAMINE CHONDR 1500 COMPLX PO) Take 1 tablet by mouth every morning.    HUMALOG MIX 75/25 KWIKPEN (75-25) 100 UNIT/ML Kwikpen Inject 20 Units into the skin daily with breakfast. 25 units with supper    levothyroxine (LEVOXYL) 50 MCG tablet Take 50 mcg by mouth daily.      metoprolol succinate (TOPROL-XL) 25 MG 24 hr tablet Take 25 mg by mouth daily.    modafinil (PROVIGIL) 200 MG tablet Take 200 mg by mouth daily.     mometasone (NASONEX) 50 MCG/ACT nasal spray Place 2 sprays into the nose daily as needed (FOR CONGESTION/ALLERGIES).    Multiple Vitamins-Minerals (CENTRUM) tablet Take 1 tablet by mouth daily.      mupirocin ointment (BACTROBAN) 2 % Place 1 application into the nose 2 (two) times daily.     nitroGLYCERIN (NITROSTAT) 0.4 MG SL tablet Place 1 tablet (0.4 mg total) under the tongue every 5 (five) minutes as needed for chest  pain. Qty: 25 tablet, Refills: 3    Omega-3 Fatty Acids (FISH OIL) 1000 MG CAPS Take 1 capsule by mouth daily.    omeprazole (PRILOSEC) 40 MG capsule TAKE 1 CAPSULE BY MOUTH ONCE DAILY FOR REFLUX. Qty: 30 capsule, Refills: 6         Discharge Condition: Stable   Discharge Instructions Get Medicines reviewed and adjusted: Please take all your medications with you for your next visit with your Primary MD  Please request your Primary MD to go over all hospital tests and procedure/radiological results at the follow up, please ask your Primary MD to get all Hospital records sent to his/her office.  If you experience worsening of your admission symptoms, develop shortness of breath, life threatening emergency, suicidal or homicidal thoughts you must seek medical attention immediately by calling 911 or calling your MD immediately if symptoms less severe.  You must read complete instructions/literature along with all the possible adverse reactions/side effects for all the Medicines you take and that have been prescribed to you. Take any new Medicines after you have completely understood and accpet all the possible adverse reactions/side effects.   Do not drive when taking Pain medications.   Do not take more than prescribed Pain, Sleep and Anxiety Medications  Special Instructions: If you have smoked or chewed Tobacco in the last 2 yrs please stop smoking, stop any regular Alcohol and or any  Recreational drug use.  Wear Seat belts while driving.  Please note  You were cared for by a hospitalist during your hospital stay. Once you are discharged, your primary care physician will handle any further medical issues. Please note that NO REFILLS for any discharge medications will be authorized once you are discharged, as it is imperative that you return to your primary care physician (or establish a relationship with a primary care physician if you do not have one) for your aftercare needs so  that they can reassess your need for medications and monitor your lab values.  Discharge Instructions    Diet - low sodium heart healthy    Complete by:  As directed      Increase activity slowly    Complete by:  As directed             Allergies  Allergen Reactions  . Ciprofloxacin     Itching and red rash up arm when infusing after 3rd cipro dose for uti on 10/27/13  . Eliquis [Apixaban] Other (See Comments)    bleeding  . Enalapril Other (See Comments)    Cough  . Metformin And Related     Severe diaarhea  . Penicillins Other (See Comments)    Reaction unspecified      Disposition: 01-Home or Self Care   Consults:  None     Significant Diagnostic Studies:  Ct Abdomen Pelvis W Contrast  02/29/2016  CLINICAL DATA:  Upper abdominal pain. History of bilateral orchectomy in 1990. Hypospadias repair with urethroplasty in the past. Previous transurethral resection of the prostate gland. EXAM: CT ABDOMEN AND PELVIS WITH CONTRAST TECHNIQUE: Multidetector CT imaging of the abdomen and pelvis was performed using the standard protocol following bolus administration of intravenous contrast. CONTRAST:  11m ISOVUE-300 IOPAMIDOL (ISOVUE-300) INJECTION 61% COMPARISON:  05/13/2015. FINDINGS: Lower chest: Prominent pulmonary vasculature. 6 mm nodule in the right lower lobe on image number 5 of series 6, confirmed on the sagittal and coronal reconstruction images. This was not included on 05/13/2015 and is stable since 12/18/2007. Hepatobiliary: Lobulated liver contours with a small right lobe, enlarged lateral segment left lobe and enlarged caudate lobe, with progression. The gallbladder is poorly visualized, most likely due to poor distention and surrounding free peritoneal fluid. Pancreas: The pancreatic tail remains somewhat short. No pancreatic mass seen. Spleen: Prominent in the axial plane, measuring 10.6 cm in length on coronal image number 103. Adrenals/Urinary Tract: Right renal  cortical scarring is again demonstrated. Normal appearing left kidney, ureters and urinary bladder. Normal appearing adrenal glands. Stomach/Bowel: Mild diffuse wall thickening involving multiple loops of jejunum. No pneumatosis. The distal small bowel and colon are unremarkable. No gastric abnormalities. Normal appearing appendix. Vascular/Lymphatic: Enlarged heart. No aneurysm or enlarged lymph nodes. Mild atheromatous arterial calcifications. The mesenteric arteries and veins are patent. Reproductive: Bicornuate uterus. No visible adnexal masses. The adnexal regions are partially obscured by free peritoneal fluid. Small or absent prostate gland. Other: Moderate amount of free peritoneal fluid. There is nodularity of the inferior aspect of the omentum. No peritoneal tumor implants seen elsewhere. No evidence of malignancy in the abdomen. Musculoskeletal: Mild lumbar and lower thoracic spine degenerative changes. IMPRESSION: 1. Mild diffuse wall thickening involving multiple loops of jejunum. This could be due to inflammation or infection. The there are no findings to indicate that this represents ischemic bowel. 2. Progressive changes of cirrhosis of the liver with borderline splenomegaly. 3. Interval moderate amount of ascites, most likely due to the cirrhosis  of the liver. 4. Probable vascular congestion in the omentum inferiorly. Omental spread of tumor is less likely based on reasons detailed above. 5. 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 Electronically Signed   By: Claudie Revering M.D.   On: 02/29/2016 16:47   Dg Chest Portable 1 View  02/29/2016  CLINICAL DATA:  Chest pain, weakness, atrial flutter EXAM: PORTABLE CHEST 1 VIEW COMPARISON:  05/13/2015 FINDINGS: Cardiomegaly again noted. Dual lead cardiac pacemaker is unchanged in position. No acute infiltrate or pleural effusion. No pulmonary edema. IMPRESSION: No active disease. Electronically Signed   By: Lahoma Crocker  M.D.   On: 02/29/2016 15:06       Filed Weights   02/29/16 1408 02/29/16 2043 03/02/16 0642  Weight: 86.682 kg (191 lb 1.6 oz) 90.175 kg (198 lb 12.8 oz) 90.01 kg (198 lb 7 oz)     Microbiology: Recent Results (from the past 240 hour(s))  C difficile quick scan w PCR reflex     Status: None   Collection Time: 03/01/16  3:00 PM  Result Value Ref Range Status   C Diff antigen NEGATIVE NEGATIVE Final   C Diff toxin NEGATIVE NEGATIVE Final   C Diff interpretation Negative for toxigenic C. difficile  Final       Blood Culture    Component Value Date/Time   SDES URINE, CATHETERIZED 05/13/2015 1315   SPECREQUEST NONE 05/13/2015 1315   CULT  05/13/2015 1315    50,000 COLONIES/mL YEAST Performed at [facility] 05/15/2015 FINAL 05/13/2015 1315      Labs: Results for orders placed or performed during the hospital encounter of 02/29/16 (from the past 48 hour(s))  CBG monitoring, ED     Status: None   Collection Time: 02/29/16  2:07 PM  Result Value Ref Range   Glucose-Capillary 91 65 - 99 mg/dL  Comprehensive metabolic panel     Status: Abnormal   Collection Time: 02/29/16  2:35 PM  Result Value Ref Range   Sodium 139 135 - 145 mmol/L   Potassium 4.9 3.5 - 5.1 mmol/L   Chloride 102 101 - 111 mmol/L   CO2 30 22 - 32 mmol/L   Glucose, Bld 91 65 - 99 mg/dL   BUN 19 6 - 20 mg/dL   Creatinine, Ser 1.04 0.61 - 1.24 mg/dL   Calcium 8.6 (L) 8.9 - 10.3 mg/dL   Total Protein 6.8 6.5 - 8.1 g/dL   Albumin 3.2 (L) 3.5 - 5.0 g/dL   AST 103 (H) 15 - 41 U/L   ALT 84 (H) 17 - 63 U/L   Alkaline Phosphatase 640 (H) 38 - 126 U/L   Total Bilirubin 1.6 (H) 0.3 - 1.2 mg/dL   GFR calc non Af Amer >60 >60 mL/min   GFR calc Af Amer >60 >60 mL/min    Comment: (NOTE) The eGFR has been calculated using the CKD EPI equation. This calculation has not been validated in all clinical situations. eGFR's persistently <60 mL/min signify possible Chronic Kidney Disease.     Anion gap 7 5 - 15  Lipase, blood     Status: None   Collection Time: 02/29/16  2:35 PM  Result Value Ref Range   Lipase 27 11 - 51 U/L  CBC WITH DIFFERENTIAL     Status: Abnormal   Collection Time: 02/29/16  2:35 PM  Result Value Ref Range   WBC 12.3 (H) 4.0 - 10.5 K/uL   RBC 4.86 4.22 -  5.81 MIL/uL   Hemoglobin 15.2 13.0 - 17.0 g/dL   HCT 47.7 39.0 - 52.0 %   MCV 98.1 78.0 - 100.0 fL   MCH 31.3 26.0 - 34.0 pg   MCHC 31.9 30.0 - 36.0 g/dL   RDW 14.8 11.5 - 15.5 %   Platelets 224 150 - 400 K/uL   Neutrophils Relative % 75 %   Neutro Abs 9.3 (H) 1.7 - 7.7 K/uL   Lymphocytes Relative 10 %   Lymphs Abs 1.2 0.7 - 4.0 K/uL   Monocytes Relative 13 %   Monocytes Absolute 1.6 (H) 0.1 - 1.0 K/uL   Eosinophils Relative 1 %   Eosinophils Absolute 0.1 0.0 - 0.7 K/uL   Basophils Relative 1 %   Basophils Absolute 0.1 0.0 - 0.1 K/uL  I-stat troponin, ED     Status: None   Collection Time: 02/29/16  2:40 PM  Result Value Ref Range   Troponin i, poc 0.02 0.00 - 0.08 ng/mL   Comment 3            Comment: Due to the release kinetics of cTnI, a negative result within the first hours of the onset of symptoms does not rule out myocardial infarction with certainty. If myocardial infarction is still suspected, repeat the test at appropriate intervals.   I-Stat CG4 Lactic Acid, ED     Status: None   Collection Time: 02/29/16  2:42 PM  Result Value Ref Range   Lactic Acid, Venous 1.42 0.5 - 2.0 mmol/L  POC occult blood, ED     Status: Abnormal   Collection Time: 02/29/16  2:52 PM  Result Value Ref Range   Fecal Occult Bld POSITIVE (A) NEGATIVE  Urinalysis, Routine w reflex microscopic (not at [facility])     Status: Abnormal   Collection Time: 02/29/16  3:17 PM  Result Value Ref Range   Color, Urine YELLOW YELLOW   APPearance CLEAR CLEAR   Specific Gravity, Urine 1.020 1.005 - 1.030   pH 6.0 5.0 - 8.0   Glucose, UA NEGATIVE NEGATIVE mg/dL   Hgb urine dipstick NEGATIVE NEGATIVE   Bilirubin  Urine NEGATIVE NEGATIVE   Ketones, ur NEGATIVE NEGATIVE mg/dL   Protein, ur TRACE (A) NEGATIVE mg/dL   Nitrite NEGATIVE NEGATIVE   Leukocytes, UA TRACE (A) NEGATIVE  Urine microscopic-add on     Status: Abnormal   Collection Time: 02/29/16  3:17 PM  Result Value Ref Range   Squamous Epithelial / LPF 0-5 (A) NONE SEEN   WBC, UA 6-30 0 - 5 WBC/hpf   RBC / HPF 0-5 0 - 5 RBC/hpf   Bacteria, UA RARE (A) NONE SEEN   Urine-Other YEAST PRESENT   POC CBG, ED     Status: None   Collection Time: 02/29/16  3:49 PM  Result Value Ref Range   Glucose-Capillary 80 65 - 99 mg/dL  Glucose, capillary     Status: Abnormal   Collection Time: 03/01/16 12:00 AM  Result Value Ref Range   Glucose-Capillary 26 (LL) 65 - 99 mg/dL   Comment 1 Notify RN    Comment 2 Document in Chart   Glucose, capillary     Status: Abnormal   Collection Time: 03/01/16 12:43 AM  Result Value Ref Range   Glucose-Capillary 188 (H) 65 - 99 mg/dL   Comment 1 Notify RN    Comment 2 Document in Chart   Glucose, capillary     Status: Abnormal   Collection Time: 03/01/16  3:37 AM  Result Value Ref Range   Glucose-Capillary 108 (H) 65 - 99 mg/dL   Comment 1 Notify RN    Comment 2 Document in Chart   TSH     Status: None   Collection Time: 03/01/16  5:15 AM  Result Value Ref Range   TSH 1.297 0.350 - 4.500 uIU/mL  Hemoglobin A1c     Status: Abnormal   Collection Time: 03/01/16  5:15 AM  Result Value Ref Range   Hgb A1c MFr Bld 6.2 (H) 4.8 - 5.6 %    Comment: (NOTE)         Pre-diabetes: 5.7 - 6.4         Diabetes: >6.4         Glycemic control for adults with diabetes: <7.0    Mean Plasma Glucose 131 mg/dL    Comment: (NOTE) Performed At: [facility] [place], [place] [id number] Lindon Romp MD NW:[id number]   Comprehensive metabolic panel     Status: Abnormal   Collection Time: 03/01/16  5:15 AM  Result Value Ref Range   Sodium 138 135 - 145 mmol/L   Potassium 4.6 3.5 - 5.1 mmol/L    Chloride 104 101 - 111 mmol/L   CO2 28 22 - 32 mmol/L   Glucose, Bld 87 65 - 99 mg/dL   BUN 14 6 - 20 mg/dL   Creatinine, Ser 0.70 0.61 - 1.24 mg/dL   Calcium 8.3 (L) 8.9 - 10.3 mg/dL   Total Protein 6.1 (L) 6.5 - 8.1 g/dL   Albumin 2.9 (L) 3.5 - 5.0 g/dL   AST 89 (H) 15 - 41 U/L   ALT 74 (H) 17 - 63 U/L   Alkaline Phosphatase 567 (H) 38 - 126 U/L   Total Bilirubin 1.8 (H) 0.3 - 1.2 mg/dL   GFR calc non Af Amer >60 >60 mL/min   GFR calc Af Amer >60 >60 mL/min    Comment: (NOTE) The eGFR has been calculated using the CKD EPI equation. This calculation has not been validated in all clinical situations. eGFR's persistently <60 mL/min signify possible Chronic Kidney Disease.    Anion gap 6 5 - 15  CBC     Status: None   Collection Time: 03/01/16  5:15 AM  Result Value Ref Range   WBC 9.6 4.0 - 10.5 K/uL   RBC 4.68 4.22 - 5.81 MIL/uL   Hemoglobin 14.7 13.0 - 17.0 g/dL   HCT 45.8 39.0 - 52.0 %   MCV 97.9 78.0 - 100.0 fL   MCH 31.4 26.0 - 34.0 pg   MCHC 32.1 30.0 - 36.0 g/dL   RDW 15.1 11.5 - 15.5 %   Platelets 182 150 - 400 K/uL  Glucose, capillary     Status: Abnormal   Collection Time: 03/01/16  7:22 AM  Result Value Ref Range   Glucose-Capillary 55 (L) 65 - 99 mg/dL   Comment 1 Notify RN   Glucose, capillary     Status: None   Collection Time: 03/01/16  8:13 AM  Result Value Ref Range   Glucose-Capillary 68 65 - 99 mg/dL  Glucose, capillary     Status: Abnormal   Collection Time: 03/01/16  8:43 AM  Result Value Ref Range   Glucose-Capillary 186 (H) 65 - 99 mg/dL  Glucose, capillary     Status: Abnormal   Collection Time: 03/01/16 11:13 AM  Result Value Ref Range   Glucose-Capillary 169 (H) 65 - 99 mg/dL   Comment 1 Notify  RN   C difficile quick scan w PCR reflex     Status: None   Collection Time: 03/01/16  3:00 PM  Result Value Ref Range   C Diff antigen NEGATIVE NEGATIVE   C Diff toxin NEGATIVE NEGATIVE   C Diff interpretation Negative for toxigenic C. difficile    Glucose, capillary     Status: Abnormal   Collection Time: 03/01/16  4:58 PM  Result Value Ref Range   Glucose-Capillary 144 (H) 65 - 99 mg/dL   Comment 1 Notify RN   Glucose, capillary     Status: Abnormal   Collection Time: 03/01/16  8:18 PM  Result Value Ref Range   Glucose-Capillary 213 (H) 65 - 99 mg/dL   Comment 1 Notify RN    Comment 2 Document in Chart   CBC     Status: None   Collection Time: 03/02/16  6:09 AM  Result Value Ref Range   WBC 9.5 4.0 - 10.5 K/uL   RBC 4.98 4.22 - 5.81 MIL/uL   Hemoglobin 15.5 13.0 - 17.0 g/dL   HCT 48.8 39.0 - 52.0 %   MCV 98.0 78.0 - 100.0 fL   MCH 31.1 26.0 - 34.0 pg   MCHC 31.8 30.0 - 36.0 g/dL   RDW 14.9 11.5 - 15.5 %   Platelets 198 150 - 400 K/uL  Comprehensive metabolic panel     Status: Abnormal   Collection Time: 03/02/16  6:09 AM  Result Value Ref Range   Sodium 136 135 - 145 mmol/L   Potassium 4.4 3.5 - 5.1 mmol/L   Chloride 101 101 - 111 mmol/L   CO2 28 22 - 32 mmol/L   Glucose, Bld 153 (H) 65 - 99 mg/dL   BUN 12 6 - 20 mg/dL   Creatinine, Ser 0.81 0.61 - 1.24 mg/dL   Calcium 8.5 (L) 8.9 - 10.3 mg/dL   Total Protein 6.4 (L) 6.5 - 8.1 g/dL   Albumin 3.1 (L) 3.5 - 5.0 g/dL   AST 68 (H) 15 - 41 U/L   ALT 69 (H) 17 - 63 U/L   Alkaline Phosphatase 548 (H) 38 - 126 U/L   Total Bilirubin 1.8 (H) 0.3 - 1.2 mg/dL   GFR calc non Af Amer >60 >60 mL/min   GFR calc Af Amer >60 >60 mL/min    Comment: (NOTE) The eGFR has been calculated using the CKD EPI equation. This calculation has not been validated in all clinical situations. eGFR's persistently <60 mL/min signify possible Chronic Kidney Disease.    Anion gap 7 5 - 15  Glucose, capillary     Status: Abnormal   Collection Time: 03/02/16  7:46 AM  Result Value Ref Range   Glucose-Capillary 189 (H) 65 - 99 mg/dL   Comment 1 Notify RN      Lipid Panel     Component Value Date/Time   CHOL 141 05/19/2010 2137   TRIG 179* 05/19/2010 2137   HDL 33* 05/19/2010 2137    CHOLHDL 4.3 Ratio 05/19/2010 2137   VLDL 36 05/19/2010 2137   LDLCALC 72 05/19/2010 2137     Lab Results  Component Value Date   HGBA1C 6.2* 03/01/2016   HGBA1C 10.3* 10/26/2013   HGBA1C 6.8 01/27/2010     Lab Results  Component Value Date   LDLCALC 72 05/19/2010   CREATININE 0.81 03/02/2016     HPI :  55 year old man 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    Presented   with complaints of increased abdominal pain.onset yesterday. Associated diarrhea. Abdominal pain improved but still with diarrhea.  She states that he has been having ongoing abdominal pain has not been eating as much as usual. He has had his blood sugar dropped several times over the past few weeks and has required EMS to come out and give IV glucose. He has complained of upper abdominal pain that is worse with eating. He has not had nausea or vomiting. CT abdomen pelvis with contrast shows thickening of loops of jejunum. Inflammation versus infection. Patient admitted for suspected enteritis.  HOSPITAL COURSE:    1. Hypoglycemia-suspect secondary to poor oral intake and continued usage of insulin 75/ 25. Patient was taking 60 units up until yesterday, now changed to 20 units in AM and 25 units in PM  Patient needed D5 normal saline for about 24 hours  To stabilize his hypoglycemia    2.   Diarrhea Stool studies ordered, but c diff negative    diarrhea  Resolved   Tolerating soft diet  3. ? Jejunal wall thickening-suspected enteritis Received IV Flagyl and Rocephin 2 days UA negative No further antibiotics at the time of discharge Symptoms have resolved   4. Gerd Cont PPI  5. Dm2 Hold insulin, Hold medication due to hypoglycemia, continue Accu-Cheks Patient received D5 normal saline  6. Aflutter Cont Metoprolol  7.  Hypothyroidism TSH normal  8. Chronically elevated alkaline phosphatase and normal bilirubin with slight elevation in AST and ALT Gastroenterology consulted, patient completely asymptomatic and has a history of elevated alkaline phosphatase Will schedule outpatient GI follow-up    Discharge Exam:   Blood pressure 127/77, pulse 101, temperature 98.2 F (36.8 C), temperature source Oral, resp. rate 18, height 5' 3"  (1.6 m), weight 90.01 kg (198 lb 7 oz), SpO2 91 %.   General exam: Appears calm and comfortable  Respiratory system: Clear to auscultation. Respiratory effort normal. Cardiovascular system: S1 & S2 heard, RRR. No JVD, murmurs, rubs, gallops or clicks. No pedal edema. Gastrointestinal system: Abdomen is nondistended, soft and nontender. No organomegaly or masses felt. Normal bowel sounds heard. Central nervous system: Alert and oriented. No focal neurological deficits. Extremities: Symmetric 5 x 5 power. Skin: No rashes, lesions or ulcers Psychiatry: Judgement and insight appear normal. Mood & affect appropriate.   Follow-up Information    Follow up with Petra Kuba, MD. Schedule an appointment as soon as possible for a visit in 3 days.   Specialty:  Family Medicine   Contact information:   [place] [address] [phone]       Signed: Reyne Dumas 03/02/2016, 10:01 AM        Time spent >45 mins

## 2016-03-02 NOTE — Progress Notes (Signed)
Discharge order in place. Pt has had 4 loose, soft stools this am. Notified Dr. Allyson Sabal. Stated to cancel discharge. No new orders received. Donavan Foil, RN

## 2016-03-03 ENCOUNTER — Ambulatory Visit: Payer: Medicare Other | Admitting: Nurse Practitioner

## 2016-03-03 LAB — COMPREHENSIVE METABOLIC PANEL
ALBUMIN: 3.2 g/dL — AB (ref 3.5–5.0)
ALT: 57 U/L (ref 17–63)
ALT: 65 U/L — AB (ref 17–63)
ANION GAP: 3 — AB (ref 5–15)
ANION GAP: 4 — AB (ref 5–15)
AST: 61 U/L — AB (ref 15–41)
AST: 67 U/L — AB (ref 15–41)
Albumin: 2.8 g/dL — ABNORMAL LOW (ref 3.5–5.0)
Alkaline Phosphatase: 505 U/L — ABNORMAL HIGH (ref 38–126)
Alkaline Phosphatase: 559 U/L — ABNORMAL HIGH (ref 38–126)
BILIRUBIN TOTAL: 1.8 mg/dL — AB (ref 0.3–1.2)
BUN: 12 mg/dL (ref 6–20)
BUN: 12 mg/dL (ref 6–20)
CHLORIDE: 104 mmol/L (ref 101–111)
CHLORIDE: 102 mmol/L (ref 101–111)
CO2: 27 mmol/L (ref 22–32)
CO2: 28 mmol/L (ref 22–32)
CREATININE: 0.84 mg/dL (ref 0.61–1.24)
Calcium: 8.1 mg/dL — ABNORMAL LOW (ref 8.9–10.3)
Calcium: 8.5 mg/dL — ABNORMAL LOW (ref 8.9–10.3)
Creatinine, Ser: 0.73 mg/dL (ref 0.61–1.24)
Glucose, Bld: 210 mg/dL — ABNORMAL HIGH (ref 65–99)
Glucose, Bld: 273 mg/dL — ABNORMAL HIGH (ref 65–99)
POTASSIUM: 4.7 mmol/L (ref 3.5–5.1)
POTASSIUM: 4.9 mmol/L (ref 3.5–5.1)
SODIUM: 134 mmol/L — AB (ref 135–145)
Sodium: 134 mmol/L — ABNORMAL LOW (ref 135–145)
TOTAL PROTEIN: 5.8 g/dL — AB (ref 6.5–8.1)
Total Bilirubin: 2.3 mg/dL — ABNORMAL HIGH (ref 0.3–1.2)
Total Protein: 6.6 g/dL (ref 6.5–8.1)

## 2016-03-03 LAB — GLUCOSE, CAPILLARY
GLUCOSE-CAPILLARY: 229 mg/dL — AB (ref 65–99)
GLUCOSE-CAPILLARY: 341 mg/dL — AB (ref 65–99)

## 2016-03-03 LAB — AMMONIA: AMMONIA: 40 umol/L — AB (ref 9–35)

## 2016-03-03 LAB — CERULOPLASMIN: CERULOPLASMIN: 68.1 mg/dL — AB (ref 16.0–31.0)

## 2016-03-03 LAB — MITOCHONDRIAL ANTIBODIES: MITOCHONDRIAL M2 AB, IGG: 7.1 U (ref 0.0–20.0)

## 2016-03-03 MED ORDER — SACCHAROMYCES BOULARDII 250 MG PO CAPS: 250.0000 mg | ORAL_CAPSULE | Freq: Two times a day (BID) | ORAL | Status: DC

## 2016-03-03 MED ORDER — CHOLESTYRAMINE 4 G PO PACK
4.0000 g | PACK | Freq: Two times a day (BID) | ORAL | Status: DC
  Administered 2016-03-03: 4 g via ORAL
  Filled 2016-03-03 (×5): qty 1

## 2016-03-03 MED ORDER — CHOLESTYRAMINE 4 G PO PACK: 4.0000 g | PACK | Freq: Two times a day (BID) | ORAL | Status: DC

## 2016-03-03 MED ORDER — SACCHAROMYCES BOULARDII 250 MG PO CAPS
250.0000 mg | ORAL_CAPSULE | Freq: Two times a day (BID) | ORAL | Status: DC
  Administered 2016-03-03: 250 mg via ORAL
  Filled 2016-03-03: qty 1

## 2016-03-03 NOTE — Progress Notes (Signed)
Discharge instructions RX's and follow up appts explained and provided to patient and wife, wife verbalized understanding of d/c instructions. Patient left floor via wheelchair accompanied by staff no c/o pain or short of breath at d/c.  Cleaven Demario, Tivis Ringer, RN

## 2016-03-03 NOTE — Progress Notes (Addendum)
Inpatient Diabetes Program Recommendations  AACE/ADA: New Consensus Statement on Inpatient Glycemic Control (2015)  Target Ranges:  Prepandial:   less than 140 mg/dL      Peak postprandial:   less than 180 mg/dL (1-2 hours)      Critically ill patients:  140 - 180 mg/dL  Results for DAEMYN, PELAGIO (MRN [id number]) as of 03/03/2016 10:41  Ref. Range 03/02/2016 07:46 03/02/2016 11:50 03/02/2016 16:26 03/02/2016 21:36 03/03/2016 07:38  Glucose-Capillary Latest Ref Range: 65-99 mg/dL 189 (H) 370 (H) 257 (H) 212 (H) 229 (H)    Review of Glycemic Control  Current orders for Inpatient glycemic control: Novolog 0-9 units TID with meals  Inpatient Diabetes Program Recommendations: Insulin - Basal: If patient is not discharged today as planned, please consider ordering 70/30 BID.  Thanks, Barnie Alderman, RN, MSN, CDE Diabetes Coordinator Inpatient Diabetes Program [phone] (Team Pager from [place] to [place]) [phone] ([facility] office) [phone] [facility] office) [phone] [facility] office)

## 2016-03-03 NOTE — Discharge Summary (Signed)
Physician Discharge Summary  [name] MRN: [id number] DOB/AGE: 1961/04/30 55 y.o.  PCP: Petra Kuba, MD   Admit date: 02/29/2016 Discharge date: 03/03/2016  Discharge Diagnoses:     Principal Problem:   Abdominal pain Active Problems:   Diarrhea   Abnormal liver function   Atrial flutter (HCC)   Hypoglycemia   Ascites Viral gastroenteritis   Follow-up recommendations Follow-up with PCP in 3-5 days , including all  additional recommended appointments as below Follow-up CBC, CMP in 3-5 days       Current Discharge Medication List    CONTINUE these medications which have NOT CHANGED   Details  albuterol (PROVENTIL) (2.5 MG/3ML) 0.083% nebulizer solution Take 2.5 mg by nebulization every 6 (six) hours as needed for wheezing or shortness of breath.    albuterol (VENTOLIN HFA) 108 (90 BASE) MCG/ACT inhaler Inhale 2 puffs into the lungs every 6 (six) hours as needed for wheezing.     aspirin 325 MG EC tablet Take 325 mg by mouth daily.    cetirizine (ZYRTEC) 10 MG tablet Take 10 mg by mouth daily.    Glucosamine-Chondroit-Vit C-Mn (GLUCOSAMINE CHONDR 1500 COMPLX PO) Take 1 tablet by mouth every morning.    HUMALOG MIX 75/25 KWIKPEN (75-25) 100 UNIT/ML Kwikpen Inject 20 Units into the skin daily with breakfast. 25 units with supper    levothyroxine (LEVOXYL) 50 MCG tablet Take 50 mcg by mouth daily.      metoprolol succinate (TOPROL-XL) 25 MG 24 hr tablet Take 25 mg by mouth daily.    modafinil (PROVIGIL) 200 MG tablet Take 200 mg by mouth daily.     mometasone (NASONEX) 50 MCG/ACT nasal spray Place 2 sprays into the nose daily as needed (FOR CONGESTION/ALLERGIES).    Multiple Vitamins-Minerals (CENTRUM) tablet Take 1 tablet by mouth daily.      mupirocin ointment (BACTROBAN) 2 % Place 1 application into the nose 2 (two) times daily.     nitroGLYCERIN (NITROSTAT) 0.4 MG SL tablet Place 1 tablet (0.4 mg total) under the tongue every 5 (five) minutes as  needed for chest pain. Qty: 25 tablet, Refills: 3    Omega-3 Fatty Acids (FISH OIL) 1000 MG CAPS Take 1 capsule by mouth daily.    omeprazole (PRILOSEC) 40 MG capsule TAKE 1 CAPSULE BY MOUTH ONCE DAILY FOR REFLUX. Qty: 30 capsule, Refills: 6         Discharge Condition: Stable   Discharge Instructions Get Medicines reviewed and adjusted: Please take all your medications with you for your next visit with your Primary MD  Please request your Primary MD to go over all hospital tests and procedure/radiological results at the follow up, please ask your Primary MD to get all Hospital records sent to his/her office.  If you experience worsening of your admission symptoms, develop shortness of breath, life threatening emergency, suicidal or homicidal thoughts you must seek medical attention immediately by calling 911 or calling your MD immediately if symptoms less severe.  You must read complete instructions/literature along with all the possible adverse reactions/side effects for all the Medicines you take and that have been prescribed to you. Take any new Medicines after you have completely understood and accpet all the possible adverse reactions/side effects.   Do not drive when taking Pain medications.   Do not take more than prescribed Pain, Sleep and Anxiety Medications  Special Instructions: If you have smoked or chewed Tobacco in the last 2 yrs please stop smoking, stop any regular Alcohol and or  any Recreational drug use.  Wear Seat belts while driving.  Please note  You were cared for by a hospitalist during your hospital stay. Once you are discharged, your primary care physician will handle any further medical issues. Please note that NO REFILLS for any discharge medications will be authorized once you are discharged, as it is imperative that you return to your primary care physician (or establish a relationship with a primary care physician if you do not have one) for your  aftercare needs so that they can reassess your need for medications and monitor your lab values.  Discharge Instructions    Diet - low sodium heart healthy    Complete by:  As directed      Diet - low sodium heart healthy    Complete by:  As directed      Increase activity slowly    Complete by:  As directed      Increase activity slowly    Complete by:  As directed             Allergies  Allergen Reactions  . Ciprofloxacin     Itching and red rash up arm when infusing after 3rd cipro dose for uti on 10/27/13  . Eliquis [Apixaban] Other (See Comments)    bleeding  . Enalapril Other (See Comments)    Cough  . Metformin And Related     Severe diaarhea  . Penicillins Other (See Comments)    Reaction unspecified      Disposition: 01-Home or Self Care   Consults:  None     Significant Diagnostic Studies:  Ct Abdomen Pelvis W Contrast  02/29/2016  CLINICAL DATA:  Upper abdominal pain. History of bilateral orchectomy in 1990. Hypospadias repair with urethroplasty in the past. Previous transurethral resection of the prostate gland. EXAM: CT ABDOMEN AND PELVIS WITH CONTRAST TECHNIQUE: Multidetector CT imaging of the abdomen and pelvis was performed using the standard protocol following bolus administration of intravenous contrast. CONTRAST:  133m ISOVUE-300 IOPAMIDOL (ISOVUE-300) INJECTION 61% COMPARISON:  05/13/2015. FINDINGS: Lower chest: Prominent pulmonary vasculature. 6 mm nodule in the right lower lobe on image number 5 of series 6, confirmed on the sagittal and coronal reconstruction images. This was not included on 05/13/2015 and is stable since 12/18/2007. Hepatobiliary: Lobulated liver contours with a small right lobe, enlarged lateral segment left lobe and enlarged caudate lobe, with progression. The gallbladder is poorly visualized, most likely due to poor distention and surrounding free peritoneal fluid. Pancreas: The pancreatic tail remains somewhat short. No pancreatic  mass seen. Spleen: Prominent in the axial plane, measuring 10.6 cm in length on coronal image number 103. Adrenals/Urinary Tract: Right renal cortical scarring is again demonstrated. Normal appearing left kidney, ureters and urinary bladder. Normal appearing adrenal glands. Stomach/Bowel: Mild diffuse wall thickening involving multiple loops of jejunum. No pneumatosis. The distal small bowel and colon are unremarkable. No gastric abnormalities. Normal appearing appendix. Vascular/Lymphatic: Enlarged heart. No aneurysm or enlarged lymph nodes. Mild atheromatous arterial calcifications. The mesenteric arteries and veins are patent. Reproductive: Bicornuate uterus. No visible adnexal masses. The adnexal regions are partially obscured by free peritoneal fluid. Small or absent prostate gland. Other: Moderate amount of free peritoneal fluid. There is nodularity of the inferior aspect of the omentum. No peritoneal tumor implants seen elsewhere. No evidence of malignancy in the abdomen. Musculoskeletal: Mild lumbar and lower thoracic spine degenerative changes. IMPRESSION: 1. Mild diffuse wall thickening involving multiple loops of jejunum. This could be due to inflammation or  infection. The there are no findings to indicate that this represents ischemic bowel. 2. Progressive changes of cirrhosis of the liver with borderline splenomegaly. 3. Interval moderate amount of ascites, most likely due to the cirrhosis of the liver. 4. Probable vascular congestion in the omentum inferiorly. Omental spread of tumor is less likely based on reasons detailed above. 5. 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 Electronically Signed   By: Claudie Revering M.D.   On: 02/29/2016 16:47   Dg Chest Portable 1 View  02/29/2016  CLINICAL DATA:  Chest pain, weakness, atrial flutter EXAM: PORTABLE CHEST 1 VIEW COMPARISON:  05/13/2015 FINDINGS: Cardiomegaly again noted. Dual lead cardiac pacemaker is unchanged  in position. No acute infiltrate or pleural effusion. No pulmonary edema. IMPRESSION: No active disease. Electronically Signed   By: Lahoma Crocker M.D.   On: 02/29/2016 15:06       Filed Weights   02/29/16 2043 03/02/16 0642 03/03/16 0508  Weight: 90.175 kg (198 lb 12.8 oz) 90.01 kg (198 lb 7 oz) 90.2 kg (198 lb 13.7 oz)     Microbiology: Recent Results (from the past 240 hour(s))  Gastrointestinal Panel by PCR , Stool     Status: None   Collection Time: 03/01/16  3:00 PM  Result Value Ref Range Status   Campylobacter species NOT DETECTED NOT DETECTED Final   Plesimonas shigelloides NOT DETECTED NOT DETECTED Final   Salmonella species NOT DETECTED NOT DETECTED Final   Yersinia enterocolitica NOT DETECTED NOT DETECTED Final   Vibrio species NOT DETECTED NOT DETECTED Final   Vibrio cholerae NOT DETECTED NOT DETECTED Final   Enteroaggregative E coli (EAEC) NOT DETECTED NOT DETECTED Final   Enteropathogenic E coli (EPEC) NOT DETECTED NOT DETECTED Final   Enterotoxigenic E coli (ETEC) NOT DETECTED NOT DETECTED Final   Shiga like toxin producing E coli (STEC) NOT DETECTED NOT DETECTED Final   E. coli O157 NOT DETECTED NOT DETECTED Final   Shigella/Enteroinvasive E coli (EIEC) NOT DETECTED NOT DETECTED Final   Cryptosporidium NOT DETECTED NOT DETECTED Final   Cyclospora cayetanensis NOT DETECTED NOT DETECTED Final   Entamoeba histolytica NOT DETECTED NOT DETECTED Final   Giardia lamblia NOT DETECTED NOT DETECTED Final   Adenovirus F40/41 NOT DETECTED NOT DETECTED Final   Astrovirus NOT DETECTED NOT DETECTED Final   Norovirus GI/GII NOT DETECTED NOT DETECTED Final   Rotavirus A NOT DETECTED NOT DETECTED Final   Sapovirus (I, II, IV, and V) NOT DETECTED NOT DETECTED Final  C difficile quick scan w PCR reflex     Status: None   Collection Time: 03/01/16  3:00 PM  Result Value Ref Range Status   C Diff antigen NEGATIVE NEGATIVE Final   C Diff toxin NEGATIVE NEGATIVE Final   C Diff  interpretation Negative for toxigenic C. difficile  Final       Blood Culture    Component Value Date/Time   SDES URINE, CATHETERIZED 05/13/2015 1315   SPECREQUEST NONE 05/13/2015 1315   CULT  05/13/2015 1315    50,000 COLONIES/mL YEAST Performed at [facility] 05/15/2015 FINAL 05/13/2015 1315      Labs: Results for orders placed or performed during the hospital encounter of 02/29/16 (from the past 48 hour(s))  Glucose, capillary     Status: Abnormal   Collection Time: 03/01/16 11:13 AM  Result Value Ref Range   Glucose-Capillary 169 (H) 65 - 99 mg/dL   Comment 1 Notify RN  Gastrointestinal Panel by PCR , Stool     Status: None   Collection Time: 03/01/16  3:00 PM  Result Value Ref Range   Campylobacter species NOT DETECTED NOT DETECTED   Plesimonas shigelloides NOT DETECTED NOT DETECTED   Salmonella species NOT DETECTED NOT DETECTED   Yersinia enterocolitica NOT DETECTED NOT DETECTED   Vibrio species NOT DETECTED NOT DETECTED   Vibrio cholerae NOT DETECTED NOT DETECTED   Enteroaggregative E coli (EAEC) NOT DETECTED NOT DETECTED   Enteropathogenic E coli (EPEC) NOT DETECTED NOT DETECTED   Enterotoxigenic E coli (ETEC) NOT DETECTED NOT DETECTED   Shiga like toxin producing E coli (STEC) NOT DETECTED NOT DETECTED   E. coli O157 NOT DETECTED NOT DETECTED   Shigella/Enteroinvasive E coli (EIEC) NOT DETECTED NOT DETECTED   Cryptosporidium NOT DETECTED NOT DETECTED   Cyclospora cayetanensis NOT DETECTED NOT DETECTED   Entamoeba histolytica NOT DETECTED NOT DETECTED   Giardia lamblia NOT DETECTED NOT DETECTED   Adenovirus F40/41 NOT DETECTED NOT DETECTED   Astrovirus NOT DETECTED NOT DETECTED   Norovirus GI/GII NOT DETECTED NOT DETECTED   Rotavirus A NOT DETECTED NOT DETECTED   Sapovirus (I, II, IV, and V) NOT DETECTED NOT DETECTED  C difficile quick scan w PCR reflex     Status: None   Collection Time: 03/01/16  3:00 PM  Result Value Ref Range   C  Diff antigen NEGATIVE NEGATIVE   C Diff toxin NEGATIVE NEGATIVE   C Diff interpretation Negative for toxigenic C. difficile   Glucose, capillary     Status: Abnormal   Collection Time: 03/01/16  4:58 PM  Result Value Ref Range   Glucose-Capillary 144 (H) 65 - 99 mg/dL   Comment 1 Notify RN   Glucose, capillary     Status: Abnormal   Collection Time: 03/01/16  8:18 PM  Result Value Ref Range   Glucose-Capillary 213 (H) 65 - 99 mg/dL   Comment 1 Notify RN    Comment 2 Document in Chart   CBC     Status: None   Collection Time: 03/02/16  6:09 AM  Result Value Ref Range   WBC 9.5 4.0 - 10.5 K/uL   RBC 4.98 4.22 - 5.81 MIL/uL   Hemoglobin 15.5 13.0 - 17.0 g/dL   HCT 48.8 39.0 - 52.0 %   MCV 98.0 78.0 - 100.0 fL   MCH 31.1 26.0 - 34.0 pg   MCHC 31.8 30.0 - 36.0 g/dL   RDW 14.9 11.5 - 15.5 %   Platelets 198 150 - 400 K/uL  Comprehensive metabolic panel     Status: Abnormal   Collection Time: 03/02/16  6:09 AM  Result Value Ref Range   Sodium 136 135 - 145 mmol/L   Potassium 4.4 3.5 - 5.1 mmol/L   Chloride 101 101 - 111 mmol/L   CO2 28 22 - 32 mmol/L   Glucose, Bld 153 (H) 65 - 99 mg/dL   BUN 12 6 - 20 mg/dL   Creatinine, Ser 0.81 0.61 - 1.24 mg/dL   Calcium 8.5 (L) 8.9 - 10.3 mg/dL   Total Protein 6.4 (L) 6.5 - 8.1 g/dL   Albumin 3.1 (L) 3.5 - 5.0 g/dL   AST 68 (H) 15 - 41 U/L   ALT 69 (H) 17 - 63 U/L   Alkaline Phosphatase 548 (H) 38 - 126 U/L   Total Bilirubin 1.8 (H) 0.3 - 1.2 mg/dL   GFR calc non Af Amer >60 >60 mL/min   GFR  calc Af Amer >60 >60 mL/min    Comment: (NOTE) The eGFR has been calculated using the CKD EPI equation. This calculation has not been validated in all clinical situations. eGFR's persistently <60 mL/min signify possible Chronic Kidney Disease.    Anion gap 7 5 - 15  Ceruloplasmin     Status: Abnormal   Collection Time: 03/02/16  6:09 AM  Result Value Ref Range   Ceruloplasmin 68.1 (H) 16.0 - 31.0 mg/dL    Comment: (NOTE) Performed At: [facility] [place], [place] [id number] Lindon Romp MD [id number]   Iron and TIBC     Status: Abnormal   Collection Time: 03/02/16  6:09 AM  Result Value Ref Range   Iron 40 (L) 45 - 182 ug/dL   TIBC 280 250 - 450 ug/dL   Saturation Ratios 14 (L) 17.9 - 39.5 %   UIBC 240 ug/dL    Comment: Performed at [facility]  Ferritin     Status: None   Collection Time: 03/02/16  6:09 AM  Result Value Ref Range   Ferritin 139 24 - 336 ng/mL    Comment: Performed at [facility]  Glucose, capillary     Status: Abnormal   Collection Time: 03/02/16  7:46 AM  Result Value Ref Range   Glucose-Capillary 189 (H) 65 - 99 mg/dL   Comment 1 Notify RN   Glucose, capillary     Status: Abnormal   Collection Time: 03/02/16 11:50 AM  Result Value Ref Range   Glucose-Capillary 370 (H) 65 - 99 mg/dL   Comment 1 Notify RN   Glucose, capillary     Status: Abnormal   Collection Time: 03/02/16  4:26 PM  Result Value Ref Range   Glucose-Capillary 257 (H) 65 - 99 mg/dL  Glucose, capillary     Status: Abnormal   Collection Time: 03/02/16  9:36 PM  Result Value Ref Range   Glucose-Capillary 212 (H) 65 - 99 mg/dL  Comprehensive metabolic panel     Status: Abnormal   Collection Time: 03/03/16  6:12 AM  Result Value Ref Range   Sodium 134 (L) 135 - 145 mmol/L   Potassium 4.7 3.5 - 5.1 mmol/L   Chloride 104 101 - 111 mmol/L   CO2 27 22 - 32 mmol/L   Glucose, Bld 210 (H) 65 - 99 mg/dL   BUN 12 6 - 20 mg/dL   Creatinine, Ser 0.73 0.61 - 1.24 mg/dL   Calcium 8.1 (L) 8.9 - 10.3 mg/dL   Total Protein 5.8 (L) 6.5 - 8.1 g/dL   Albumin 2.8 (L) 3.5 - 5.0 g/dL   AST 61 (H) 15 - 41 U/L   ALT 57 17 - 63 U/L   Alkaline Phosphatase 505 (H) 38 - 126 U/L   Total Bilirubin 1.8 (H) 0.3 - 1.2 mg/dL   GFR calc non Af Amer >60 >60 mL/min   GFR calc Af Amer >60 >60 mL/min    Comment: (NOTE) The eGFR has been calculated using the CKD EPI equation. This calculation has not been  validated in all clinical situations. eGFR's persistently <60 mL/min signify possible Chronic Kidney Disease.    Anion gap 3 (L) 5 - 15  Glucose, capillary     Status: Abnormal   Collection Time: 03/03/16  7:38 AM  Result Value Ref Range   Glucose-Capillary 229 (H) 65 - 99 mg/dL     Lipid Panel     Component Value Date/Time   CHOL  141 05/19/2010 2137   TRIG 179* 05/19/2010 2137   HDL 33* 05/19/2010 2137   CHOLHDL 4.3 Ratio 05/19/2010 2137   VLDL 36 05/19/2010 2137   LDLCALC 72 05/19/2010 2137     Lab Results  Component Value Date   HGBA1C 6.2* 03/01/2016   HGBA1C 10.3* 10/26/2013   HGBA1C 6.8 01/27/2010     Lab Results  Component Value Date   LDLCALC 72 05/19/2010   CREATININE 0.73 03/03/2016     HPI :  55 year old man 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    Presented   with complaints of increased abdominal pain.onset yesterday. Associated diarrhea. Abdominal pain improved but still with diarrhea.  She states that he has been having ongoing abdominal pain has not been eating as much as usual. He has had his blood sugar dropped several times over the past few weeks and has required EMS to come out and give IV glucose. He has complained of upper abdominal pain that is worse with eating. He has not had nausea or vomiting. CT abdomen pelvis with contrast shows thickening of loops of jejunum. Inflammation versus infection. Patient admitted for suspected enteritis.  HOSPITAL COURSE:    1. Hypoglycemia-suspect secondary to poor oral intake and continued usage of insulin 75/ 25. Patient was taking 60 units up until yesterday, now changed to 20 units in AM and 25 units in PM  Patient needed D5 normal saline for about 24 hours  To stabilize his hypoglycemia    2.   Diarrhea, likely secondary to viral  gastroenteritis Stool studies ordered, but c diff negative , GI pathogen panel negative   diarrhea  improving, patient started on cholestyramine and antibiotics have been discontinued Patient also started on a probiotic, recommended to avoid milk and raw salads   Tolerating soft diet  3. ? Jejunal wall thickening-suspected enteritis Received IV Flagyl and Rocephin 2 days, antibiotics now discontinued UA negative No further antibiotics at the time of discharge Symptoms have resolved   4. Gerd Cont PPI  5. Dm2 with recurrent hypoglycemia prior to admission Held NPH initially and patient was started on D5, Accu-Cheks remained stable after discontinuation of D5 We have restarted NPH at a lower dose  6. Aflutter Cont Metoprolol  7. Hypothyroidism TSH normal  8. Chronically elevated alkaline phosphatase and normal bilirubin with slight elevation in AST and ALT Gastroenterology consulted, patient completely asymptomatic and has a history of elevated alkaline phosphatase Will schedule outpatient GI follow-up    Discharge Exam:   Blood pressure 132/67, pulse 103, temperature 98.2 F (36.8 C), temperature source Oral, resp. rate 18, height 5' 3"  (1.6 m), weight 90.2 kg (198 lb 13.7 oz), SpO2 93 %.   General exam: Appears calm and comfortable  Respiratory system: Clear to auscultation. Respiratory effort normal. Cardiovascular system: S1 & S2 heard, RRR. No JVD, murmurs, rubs, gallops or clicks. No pedal edema. Gastrointestinal system: Abdomen is nondistended, soft and nontender. No organomegaly or masses felt. Normal bowel sounds heard. Central nervous system: Alert and oriented. No focal neurological deficits. Extremities: Symmetric 5 x 5 power. Skin: No rashes, lesions or ulcers Psychiatry: Judgement and insight appear normal. Mood & affect appropriate.   Follow-up Information    Follow up with Petra Kuba, MD On 03/04/2016.   Specialty:  Family Medicine   Why:   2:00 on THursday   Contact  information:   [address] [place] [address] [phone]       Follow up with Manus Rudd, MD. Schedule an appointment as soon as possible for a visit on 03/09/2016.   Specialty:  Gastroenterology   Why:  3:30 with Johnn Hai information:   [address] [place] [address] [phone]       Signed: Reyne Dumas 03/03/2016, 9:13 AM        Time spent >45 mins

## 2016-03-05 ENCOUNTER — Encounter: Payer: Self-pay | Admitting: Cardiology

## 2016-03-08 ENCOUNTER — Ambulatory Visit (INDEPENDENT_AMBULATORY_CARE_PROVIDER_SITE_OTHER): Payer: Medicare Other | Admitting: *Deleted

## 2016-03-08 DIAGNOSIS — I495 Sick sinus syndrome: Secondary | ICD-10-CM

## 2016-03-08 NOTE — Progress Notes (Signed)
Remote pacemaker transmission.   

## 2016-03-09 ENCOUNTER — Encounter: Payer: Self-pay | Admitting: Internal Medicine

## 2016-03-09 ENCOUNTER — Ambulatory Visit (INDEPENDENT_AMBULATORY_CARE_PROVIDER_SITE_OTHER): Payer: Medicare Other | Admitting: Internal Medicine

## 2016-03-09 ENCOUNTER — Other Ambulatory Visit: Payer: Self-pay

## 2016-03-09 VITALS — BP 130/86 | HR 96 | Temp 96.7°F | Ht 63.0 in | Wt 200.4 lb

## 2016-03-09 DIAGNOSIS — K529 Noninfective gastroenteritis and colitis, unspecified: Secondary | ICD-10-CM

## 2016-03-09 DIAGNOSIS — K219 Gastro-esophageal reflux disease without esophagitis: Secondary | ICD-10-CM

## 2016-03-09 DIAGNOSIS — K7460 Unspecified cirrhosis of liver: Secondary | ICD-10-CM

## 2016-03-09 DIAGNOSIS — K7469 Other cirrhosis of liver: Secondary | ICD-10-CM

## 2016-03-09 DIAGNOSIS — K5289 Other specified noninfective gastroenteritis and colitis: Secondary | ICD-10-CM

## 2016-03-09 LAB — CUP PACEART REMOTE DEVICE CHECK
Battery Remaining Longevity: 145 mo
Battery Voltage: 2.79 V
Date Time Interrogation Session: 20170619100057
Implantable Lead Location: 753860
Implantable Lead Model: 4524
Lead Channel Pacing Threshold Pulse Width: 0.4 ms
Lead Channel Sensing Intrinsic Amplitude: 1.4 mV
Lead Channel Setting Pacing Amplitude: 2.5 V
Lead Channel Setting Pacing Pulse Width: 0.4 ms
MDC IDC LEAD IMPLANT DT: 20050923
MDC IDC LEAD IMPLANT DT: 20050923
MDC IDC LEAD LOCATION: 753859
MDC IDC MSMT BATTERY IMPEDANCE: 135 Ohm
MDC IDC MSMT LEADCHNL RA IMPEDANCE VALUE: 267 Ohm
MDC IDC MSMT LEADCHNL RV IMPEDANCE VALUE: 378 Ohm
MDC IDC MSMT LEADCHNL RV PACING THRESHOLD AMPLITUDE: 0.375 V
MDC IDC MSMT LEADCHNL RV SENSING INTR AMPL: 8 mV
MDC IDC SET LEADCHNL RV PACING AMPLITUDE: 2 V
MDC IDC SET LEADCHNL RV SENSING SENSITIVITY: 2.8 mV
MDC IDC STAT BRADY AP VP PERCENT: 0 %
MDC IDC STAT BRADY AP VS PERCENT: 0 %
MDC IDC STAT BRADY AS VP PERCENT: 0 %
MDC IDC STAT BRADY AS VS PERCENT: 100 %

## 2016-03-09 NOTE — Progress Notes (Signed)
Primary Care Physician:  Petra Kuba, MD Primary Gastroenterologist:  Dr. Gala Romney  Pre-Procedure History & Physical: HPI:  [name] is a 55 y.o. male here for follow-up of [name] cirrhosis; recent hospitalization for gastroenteritis. Patient found to have jejunal Thickening on CT. He responded to a course of antibiotics. GI pathogen panel negative. Overall, is clinically improved. GERD symptoms well controlled on omeprazole 40 mg daily. Diarrhea has resolved;  he wonders if he can stop taking cholestyramine. No varices on last years EGD. Colonic adenoma found and removed. Overall, he is doing better. History of primarily elevated alkaline phosphatase. AMA has been repeatedly negative.  Prior liver biopsy inconclusive for specific pathology.  Past Medical History  Diagnosis Date  . Chest pain 2007, 2009    cardiac cath > normal coronary arterie; nl left ventricular function  . Cardiac conduction disorder     status post pacemaker implantation in 1995 generator replaced 2005  . Aortic stenosis     mild  . Mitral regurgitation     insignificant  . HTN (hypertension)   . Hyperlipidemia     statin discontinued due to abn LFTs  . Syncope   . Obesity   . Pelvic mass     identified in 2009, stable 2010  . 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 Diabetes mellitus     +Insulin  . Benign hypertrophy of prostate     with urinary retention; repaired hypospadia; transurethral resection of the prostate   . GERD (gastroesophageal reflux disease)     status post multiple dilatations for stricture  . Congenital deafness   . Sleep apnea     BiPAP and continuous oxygen  . Obesity 4/08    BMI= 40  . Anemia   . Alcohol use ([place])   . COPD (chronic obstructive pulmonary disease) ([place])   . Thyroid disease   . Edema   . Atrial flutter [facility])     Past Surgical History  Procedure Laterality Date  . Orchiectomy  1990   bilateral; ?neoplasm  . Repair hypospadias w/ urethroplasty    . Transurethral resection of prostate      for BPH  . Tonsillectomy and adenoidectomy    . A-v cardiac pacemaker insertion  1995  . Colonoscopy  2008  . Pacemaker insertion  05/2004; 10/04/2013    MDT dual chamber pacemaker; gen change 10/04/2013 (MDT ADDRL1)  . Permanent pacemaker generator change N/A 10/04/2013    Procedure: PERMANENT PACEMAKER GENERATOR CHANGE;  Surgeon: Evans Lance, MD;  Location[facility] CATH LAB;  Service: Cardiovascular;  Laterality: N/A;  . Colonoscopy N/A 06/23/2015    [id number] diverticulosis  . Esophagogastroduodenoscopy N/A 06/23/2015    [id number] portal gastropathy    Prior to Admission medications   Medication Sig Start Date End Date Taking? Authorizing Provider  albuterol (PROVENTIL) (2.5 MG/3ML) 0.083% nebulizer solution Take 2.5 mg by nebulization every 6 (six) hours as needed for wheezing or shortness of breath.   Yes Historical Provider, MD  albuterol (VENTOLIN HFA) 108 (90 BASE) MCG/ACT inhaler Inhale 2 puffs into the lungs every 6 (six) hours as needed for wheezing.    Yes Historical Provider, MD  aspirin 325 MG EC tablet Take 325 mg by mouth daily.   Yes Historical Provider, MD  cetirizine (ZYRTEC) 10 MG tablet Take 10 mg by mouth daily.   Yes Historical Provider, MD  cholestyramine (QUESTRAN) 4 g packet Take 1  packet (4 g total) by mouth 2 (two) times daily. 03/03/16  Yes Reyne Dumas, MD  Glucosamine-Chondroit-Vit C-Mn (GLUCOSAMINE CHONDR 1500 COMPLX PO) Take 1 tablet by mouth every morning.   Yes Historical Provider, MD  HUMALOG MIX 75/25 KWIKPEN (75-25) 100 UNIT/ML Kwikpen 20 units in the morning and 25 units in the evening 03/02/16  Yes Reyne Dumas, MD  levothyroxine (LEVOXYL) 50 MCG tablet Take 50 mcg by mouth daily.     Yes Historical Provider, MD  metoprolol succinate (TOPROL-XL) 25 MG 24 hr tablet Take 25 mg by mouth daily.   Yes Historical Provider, MD  modafinil (PROVIGIL) 200 MG  tablet Take 200 mg by mouth daily.  01/13/15  Yes Historical Provider, MD  mometasone (NASONEX) 50 MCG/ACT nasal spray Place 2 sprays into the nose daily as needed (FOR CONGESTION/ALLERGIES).   Yes Historical Provider, MD  Multiple Vitamins-Minerals (CENTRUM) tablet Take 1 tablet by mouth daily.     Yes Historical Provider, MD  mupirocin ointment (BACTROBAN) 2 % Place 1 application into the nose 2 (two) times daily.    Yes Historical Provider, MD  nitroGLYCERIN (NITROSTAT) 0.4 MG SL tablet Place 1 tablet (0.4 mg total) under the tongue every 5 (five) minutes as needed for chest pain. 06/26/15  Yes Lendon Colonel, NP  Omega-3 Fatty Acids (FISH OIL) 1000 MG CAPS Take 1 capsule by mouth daily.   Yes Historical Provider, MD  omeprazole (PRILOSEC) 40 MG capsule TAKE 1 CAPSULE BY MOUTH ONCE DAILY FOR REFLUX. 12/23/15  Yes Arnoldo Lenis, MD  saccharomyces boulardii (FLORASTOR) 250 MG capsule Take 1 capsule (250 mg total) by mouth 2 (two) times daily. 03/03/16  Yes Reyne Dumas, MD    Allergies as of 03/09/2016 - Review Complete 03/09/2016  Allergen Reaction Noted  . Ciprofloxacin  10/27/2013  . Eliquis [apixaban] Other (See Comments) 03/18/2014  . Enalapril Other (See Comments) 04/15/2011  . Metformin and related  07/30/2013  . Penicillins Other (See Comments)     Family History  Problem Relation Age of Onset  . COPD Mother   . Hypertension Mother   . Pneumonia Father   . Hypertension Other   . Heart disease Other   . Congestive Heart Failure Mother   . Colon cancer Neg Hx     Social History   Social History  . Marital Status: Married    Spouse Name: N/A  . Number of Children: N/A  . Years of Education: N/A   Occupational History  . Not on file.   Social History Main Topics  . Smoking status: Never Smoker   . Smokeless tobacco: Never Used     Comment: tobacco use- no   . Alcohol Use: No  . Drug Use: No  . Sexual Activity: Yes   Other Topics Concern  . Not on file    Social History Narrative   Part time.     Review of Systems: See HPI, otherwise negative ROS  Physical Exam: BP 130/86 mmHg  Pulse 96  Temp(Src) 96.7 F (35.9 C)  Ht 5\' 3"  (1.6 m)  Wt 200 lb 6.4 oz (90.901 kg)  BMI 35.51 kg/m2 General:   Chronically ill gentleman with disconjugate gaze. Appears mentally challenged. He has nasal O2 in place. Skin:  Intact without significant lesions or rashes. Eyes:  Sclera clear, no icterus.   Conjunctiva pink. Lungs:  Clear throughout to auscultation.   No wheezes, crackles, or rhonchi. No acute distress. Heart:  Regular rate and rhythm; no murmurs, clicks,  rubs,  or gallops. Abdomen: rotund. Positive bowel sounds. Soft non-tender no obvious hepatosplenomegaly..no shifting dullness or fluid wave. Pulses:  Normal pulses noted. Extremities:  Without clubbing or edema.  Impression:  Pleasant 55 year old gentleman with multiple medical problems recently hospitalized with gastroenteritis- type illness. Jejunitis on CT. Not felt to be ischemic in origin. He has recovered over time along with a course of antibiotics. GI pathogen panel came back negative. Diarrhea has resolved. He wants to stop taking cholestyramine and Florastar..  Reflux symptoms well controlled on omeprazole 40 mg daily.  History of [place] cirrhosis. No evidence of hepatoma on recent CT scan. Moreover, no evidence of esophageal varices on last years EGD. History of colonic adenoma removed last year  Recommendations:  May stop Florastar  Stop Cholestryamine  Continue Prilosec 40 mg daily  Repeat ultrasound  every 6 months  Continue Drinking coffee everyday  Hepatic profile and OV with [name] in 3 months  Plan for EGD in 18 months and surveillance colonoscopy in about 4 1/2 years from now.    Notice: This dictation was prepared with Dragon dictation along with smaller phrase technology. Any transcriptional errors that result from this process are unintentional and may not be  corrected upon review.

## 2016-03-10 ENCOUNTER — Encounter: Payer: Self-pay | Admitting: Internal Medicine

## 2016-03-11 ENCOUNTER — Ambulatory Visit (HOSPITAL_COMMUNITY)
Admission: RE | Admit: 2016-03-11 | Discharge: 2016-03-11 | Disposition: A | Discharge status: Home / Self Care | Payer: Medicare Other | Source: Ambulatory Visit | Attending: Internal Medicine | Admitting: Internal Medicine

## 2016-03-11 DIAGNOSIS — K7469 Other cirrhosis of liver: Secondary | ICD-10-CM | POA: Insufficient documentation

## 2016-03-11 DIAGNOSIS — R140 Abdominal distension (gaseous): Secondary | ICD-10-CM | POA: Diagnosis not present

## 2016-03-11 DIAGNOSIS — N281 Cyst of kidney, acquired: Secondary | ICD-10-CM | POA: Diagnosis not present

## 2016-03-11 DIAGNOSIS — K828 Other specified diseases of gallbladder: Secondary | ICD-10-CM | POA: Diagnosis not present

## 2016-03-11 DIAGNOSIS — R188 Other ascites: Secondary | ICD-10-CM | POA: Insufficient documentation

## 2016-03-11 DIAGNOSIS — R932 Abnormal findings on diagnostic imaging of liver and biliary tract: Secondary | ICD-10-CM | POA: Insufficient documentation

## 2016-03-11 DIAGNOSIS — R161 Splenomegaly, not elsewhere classified: Secondary | ICD-10-CM | POA: Insufficient documentation

## 2016-03-12 ENCOUNTER — Encounter: Payer: Self-pay | Admitting: Cardiology

## 2016-05-17 ENCOUNTER — Other Ambulatory Visit: Payer: Self-pay

## 2016-05-17 DIAGNOSIS — K7460 Unspecified cirrhosis of liver: Secondary | ICD-10-CM

## 2016-06-07 ENCOUNTER — Telehealth: Payer: Self-pay | Admitting: Cardiology

## 2016-06-07 ENCOUNTER — Ambulatory Visit (INDEPENDENT_AMBULATORY_CARE_PROVIDER_SITE_OTHER): Payer: Medicare Other | Admitting: *Deleted

## 2016-06-07 DIAGNOSIS — I495 Sick sinus syndrome: Secondary | ICD-10-CM | POA: Diagnosis not present

## 2016-06-07 NOTE — Telephone Encounter (Signed)
Confirmed remote transmission w/ pt wife.   

## 2016-06-07 NOTE — Progress Notes (Signed)
Remote pacemaker transmission.   

## 2016-06-09 ENCOUNTER — Encounter: Payer: Self-pay | Admitting: Cardiology

## 2016-06-10 LAB — HEPATIC FUNCTION PANEL
ALK PHOS: 930 U/L — AB (ref 40–115)
ALT: 73 U/L — ABNORMAL HIGH (ref 9–46)
AST: 89 U/L — AB (ref 10–35)
Albumin: 3.2 g/dL — ABNORMAL LOW (ref 3.6–5.1)
Bilirubin, Direct: 0.9 mg/dL — ABNORMAL HIGH (ref <=0.2)
Indirect Bilirubin: 1.2 mg/dL (ref 0.2–1.2)
TOTAL PROTEIN: 5.8 g/dL — AB (ref 6.1–8.1)
Total Bilirubin: 2.1 mg/dL — ABNORMAL HIGH (ref 0.2–1.2)

## 2016-06-14 ENCOUNTER — Ambulatory Visit (INDEPENDENT_AMBULATORY_CARE_PROVIDER_SITE_OTHER): Payer: Medicare Other | Admitting: Nurse Practitioner

## 2016-06-14 ENCOUNTER — Encounter: Payer: Self-pay | Admitting: Nurse Practitioner

## 2016-06-14 DIAGNOSIS — R945 Abnormal results of liver function studies: Secondary | ICD-10-CM

## 2016-06-14 DIAGNOSIS — R7989 Other specified abnormal findings of blood chemistry: Secondary | ICD-10-CM

## 2016-06-14 DIAGNOSIS — R748 Abnormal levels of other serum enzymes: Secondary | ICD-10-CM | POA: Diagnosis not present

## 2016-06-14 MED ORDER — URSODIOL 500 MG PO TABS: 500.0000 mg | ORAL_TABLET | Freq: Two times a day (BID) | ORAL | 2 refills | Status: DC

## 2016-06-14 NOTE — Assessment & Plan Note (Signed)
Persistent elevated LFTs as noted above. There was significant jump in particular in his alkaline phosphatase from the mid 500s at baseline up to 930 two days ago. He is not on any medications known to specifically elevated alkaline phosphatase in isolation with other liver function tests at baseline. Review of lab noted recommended empiric trial of Urso. Differentials include partial common bile duct obstruction, hepatocellular carcinoma, primary sclerosing cholangitis, and primary biliary cirrhosis. I will recheck his ultrasound it has not been done in a few months for any changes in his common bile duct, development of stones, or noted partial distraction. I will draw a GGT today. We will start on empiric Urso, recommended dosing 13-15 mg/kg per day which gives a range of about 1000-1200 mg per day, divided. Given dosage availability all start him on 500 mg twice a day. I will recheck hepatic function panel and GGT in 4 weeks, recommended six-week follow-up. He is instructed to call us for any significant changes.

## 2016-06-14 NOTE — Assessment & Plan Note (Signed)
The patient has a history of persistently elevated liver enzymes. His AST/ALT is elevated at last draw couple days ago as well as bilirubin. These seem to be at least within baseline for the patient's normal. Alkaline phosphatase has had a significant jump recently as noted below. Further workup as per below. Return for follow-up in 4 weeks.

## 2016-06-14 NOTE — Patient Instructions (Signed)
1. Have your lab test done today. 2. We will help you schedule your ultrasound. 3. I put in for some labs for you to have drawn in one month. 4. I'm sending in a medication called her so to your pharmacy. Take 500 mg, twice a day. 5. We will see back in the office in 6 weeks. 6. Call us if any problems or worsening symptoms before then.

## 2016-06-14 NOTE — Progress Notes (Signed)
Referring Provider: Petra Kuba, MD Primary Care Physician:  Petra Kuba, MD Primary GI:  Dr. Gala Romney  Chief Complaint  Patient presents with  . Follow-up    HPI:   [name] is a 55 y.o. male who presents for followup. He was last seen in our office 03/09/16 for hospital follow-up on gastroenteritis, GERD, and jejunitis on CT. At that time he was doing better overall. Indicated his jejunitis on CT not felt to be ischemic in origin and he has recovered over time with antibiotics. GI pathogen panel came back negative. Diarrhea resolved, reflux symptoms well controlled on omeprazole. Related to his cirrhosis no evidence of hepatoma on recent CT scan, no evidence of esophageal varices on last EGD. Last CT in 2016 with colonic adenoma removed.   Recommended stop Florastorstore,, stop cholestyramine, continue Prilosec 40 mg daily, repeat ultrasound every 6 months for cirrhosis, hepatic function profile and office visit in 3 months, plan for EGD and 18 months (December 2018) and repeat colonoscopy in 4-1/2 years. The colonoscopy and ultrasound are both on recall.  He had liver function tests drawn 06/09/2016 which noted persistent elevation of bilirubin, AST/ALT essentially within the patient's range of normal. However, there was a significant jump in his alkaline phosphatase from a baseline in the mid 500s up to 930.  Today he states he feels alright. Does have intermittent changes in his bowel changed from normal to loose. Worse loose stools with high fiber/roughage like fresh salads. Denies abdominal pain, N/V, yellow skin/eyes, darkened urine, hematochezia, melena, abdominal swelling, LE edema, generalized pruritis.   Past Medical History:  Diagnosis Date  . Alcohol use ([place])   . Anemia   . Aortic stenosis    mild  . Atrial flutter ([place])   . Benign hypertrophy of prostate    with urinary retention; repaired hypospadia; transurethral resection of the prostate   .  Cardiac conduction disorder    status post pacemaker implantation in 1995 generator replaced 2005  . Chest pain 2007, 2009   cardiac cath > normal coronary arterie; nl left ventricular function  . Congenital deafness   . COPD (chronic obstructive pulmonary disease) ([place])   . Diabetes mellitus    +Insulin  . Edema   . GERD (gastroesophageal reflux disease)    status post multiple dilatations for stricture  . 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 HTN (hypertension)   . Hyperlipidemia    statin discontinued due to abn LFTs  . Mitral regurgitation    insignificant  . Obesity   . Obesity 4/08   BMI= 40  . Pelvic mass    identified in 2009, stable 2010  . Sleep apnea    BiPAP and continuous oxygen  . Syncope   . Thyroid disease     Past Surgical History:  Procedure Laterality Date  . A-V CARDIAC PACEMAKER INSERTION  1995  . COLONOSCOPY  2008  . COLONOSCOPY N/A 06/23/2015   [id number] diverticulosis  . ESOPHAGOGASTRODUODENOSCOPY N/A 06/23/2015   [id number] portal gastropathy  . ORCHIECTOMY  1990   bilateral; ?neoplasm  . PACEMAKER INSERTION  05/2004; 10/04/2013   MDT dual chamber pacemaker; gen change 10/04/2013 (MDT ADDRL1)  . PERMANENT PACEMAKER GENERATOR CHANGE N/A 10/04/2013   Procedure: PERMANENT PACEMAKER GENERATOR CHANGE;  Surgeon: Evans Lance, MD;  [facility] LAB;  Service: Cardiovascular;  Laterality: N/A;  . REPAIR HYPOSPADIAS W/ URETHROPLASTY    . TONSILLECTOMY  AND ADENOIDECTOMY    . TRANSURETHRAL RESECTION OF PROSTATE     for BPH    Current Outpatient Prescriptions  Medication Sig Dispense Refill  . albuterol (PROVENTIL) (2.5 MG/3ML) 0.083% nebulizer solution Take 2.5 mg by nebulization every 6 (six) hours as needed for wheezing or shortness of breath.    Marland Kitchen albuterol (VENTOLIN HFA) 108 (90 BASE) MCG/ACT inhaler Inhale 2 puffs into the lungs every 6 (six) hours as needed for wheezing.     Marland Kitchen  aspirin 325 MG EC tablet Take 325 mg by mouth daily.    . cetirizine (ZYRTEC) 10 MG tablet Take 10 mg by mouth daily.    . Glucosamine-Chondroit-Vit C-Mn (GLUCOSAMINE CHONDR 1500 COMPLX PO) Take 1 tablet by mouth every morning.    Marland Kitchen HUMALOG MIX 75/25 KWIKPEN (75-25) 100 UNIT/ML Kwikpen 20 units in the morning and 25 units in the evening (Patient taking differently: 15 Units. 15 units in the morning and 15 units in the evening) 15 mL 11  . levothyroxine (LEVOXYL) 50 MCG tablet Take 50 mcg by mouth daily.      Marland Kitchen losartan (COZAAR) 25 MG tablet Take 25 mg by mouth daily.    . metoprolol succinate (TOPROL-XL) 25 MG 24 hr tablet Take 25 mg by mouth daily.    . modafinil (PROVIGIL) 200 MG tablet Take 200 mg by mouth daily.     . mometasone (NASONEX) 50 MCG/ACT nasal spray Place 2 sprays into the nose daily as needed (FOR CONGESTION/ALLERGIES).    . Multiple Vitamins-Minerals (CENTRUM) tablet Take 1 tablet by mouth daily.      . nitroGLYCERIN (NITROSTAT) 0.4 MG SL tablet Place 1 tablet (0.4 mg total) under the tongue every 5 (five) minutes as needed for chest pain. 25 tablet 3  . Omega-3 Fatty Acids (FISH OIL) 1000 MG CAPS Take 1 capsule by mouth daily.    Marland Kitchen omeprazole (PRILOSEC) 40 MG capsule TAKE 1 CAPSULE BY MOUTH ONCE DAILY FOR REFLUX. 30 capsule 6   No current facility-administered medications for this visit.     Allergies as of 06/14/2016 - Review Complete 06/14/2016  Allergen Reaction Noted  . Ciprofloxacin  10/27/2013  . Eliquis [apixaban] Other (See Comments) 03/18/2014  . Enalapril Other (See Comments) 04/15/2011  . Metformin and related  07/30/2013  . Penicillins Other (See Comments)     Family History  Problem Relation Age of Onset  . COPD Mother   . Hypertension Mother   . Congestive Heart Failure Mother   . Pneumonia Father   . Heart disease Other   . Hypertension Other   . Colon cancer Neg Hx     Social History   Social History  . Marital status: Married    Spouse  name: N/A  . Number of children: N/A  . Years of education: N/A   Social History Main Topics  . Smoking status: Never Smoker  . Smokeless tobacco: Never Used     Comment: tobacco use- no   . Alcohol use No  . Drug use: No  . Sexual activity: Yes   Other Topics Concern  . None   Social History Narrative   Part time.     Review of Systems: General: Negative for anorexia, weight loss, fever, chills, fatigue, weakness. ENT: Negative for hoarseness, difficulty swallowing. CV: Negative for chest pain, angina, palpitations, peripheral edema.  Respiratory: Negative for dyspnea at rest, cough, sputum, wheezing.  GI: See history of present illness. Derm: Negative for rash or itching.  Neuro: Negative  for acute confusion episodes.  Endo: Negative for unusual weight change.  Heme: Negative for bruising or bleeding. Allergy: Negative for rash or hives.   Physical Exam: BP 115/90   Pulse (!) 108   Temp 97.9 F (36.6 C) (Oral)   Ht 5\' 3"  (1.6 m)   Wt 179 lb 12.8 oz (81.6 kg)   BMI 31.85 kg/m  General:   Obese male. Alert and oriented. Pleasant and cooperative. Well-nourished and well-developed.  Head:  Normocephalic and atraumatic. Eyes:  Without icterus, sclera clear and conjunctiva pink.  Ears:  Normal auditory acuity. Cardiovascular:  S1, S2 present with 4/6 systolic murmur appreciated. Extremities without clubbing or edema. Respiratory:  Clear to auscultation bilaterally. No wheezes, rales, or rhonchi. No distress.  Gastrointestinal:  +BS, rounded and firm but not tense, non-tender and non-distended. No HSM noted. No guarding or rebound. Rectal:  Deferred  Musculoskalatal:  Symmetrical without gross deformities. Skin:  Intact without significant lesions or rashes. Neurologic:  Alert and oriented x4;  grossly normal neurologically. Psych:  Alert and cooperative. Normal mood and affect. Heme/Lymph/Immune: No excessive bruising noted.    06/14/2016 2:01 PM   Disclaimer:  This note was dictated with voice recognition software. Similar sounding words can inadvertently be transcribed and may not be corrected upon review.

## 2016-06-15 LAB — GAMMA GT: GGT: 1088 U/L — ABNORMAL HIGH (ref 7–51)

## 2016-06-15 NOTE — Progress Notes (Signed)
cc'ed to pcp °

## 2016-06-18 ENCOUNTER — Other Ambulatory Visit: Payer: Self-pay

## 2016-06-18 DIAGNOSIS — K7460 Unspecified cirrhosis of liver: Secondary | ICD-10-CM

## 2016-06-23 ENCOUNTER — Ambulatory Visit (HOSPITAL_COMMUNITY)
Admission: RE | Admit: 2016-06-23 | Discharge: 2016-06-23 | Disposition: A | Discharge status: Home / Self Care | Payer: Medicare Other | Source: Ambulatory Visit | Attending: Nurse Practitioner | Admitting: Nurse Practitioner

## 2016-06-23 DIAGNOSIS — K7460 Unspecified cirrhosis of liver: Secondary | ICD-10-CM | POA: Diagnosis not present

## 2016-06-23 DIAGNOSIS — R945 Abnormal results of liver function studies: Secondary | ICD-10-CM

## 2016-06-23 DIAGNOSIS — R7989 Other specified abnormal findings of blood chemistry: Secondary | ICD-10-CM | POA: Diagnosis not present

## 2016-06-23 DIAGNOSIS — R188 Other ascites: Secondary | ICD-10-CM | POA: Insufficient documentation

## 2016-06-23 DIAGNOSIS — R748 Abnormal levels of other serum enzymes: Secondary | ICD-10-CM | POA: Diagnosis not present

## 2016-06-25 LAB — CUP PACEART REMOTE DEVICE CHECK
Battery Remaining Longevity: 145 mo
Brady Statistic AP VP Percent: 0 %
Brady Statistic AS VS Percent: 100 %
Implantable Lead Implant Date: 20050923
Implantable Lead Location: 753860
Lead Channel Impedance Value: 297 Ohm
Lead Channel Pacing Threshold Amplitude: 0.5 V
Lead Channel Pacing Threshold Pulse Width: 0.4 ms
Lead Channel Sensing Intrinsic Amplitude: 5.6 mV
Lead Channel Setting Pacing Amplitude: 2.5 V
Lead Channel Setting Sensing Sensitivity: 2.8 mV
MDC IDC LEAD IMPLANT DT: 20050923
MDC IDC LEAD LOCATION: 753859
MDC IDC LEAD MODEL: 4524
MDC IDC MSMT BATTERY IMPEDANCE: 135 Ohm
MDC IDC MSMT BATTERY VOLTAGE: 2.79 V
MDC IDC MSMT LEADCHNL RA SENSING INTR AMPL: 1.4 mV
MDC IDC MSMT LEADCHNL RV IMPEDANCE VALUE: 428 Ohm
MDC IDC SESS DTM: 20170918143311
MDC IDC SET LEADCHNL RV PACING AMPLITUDE: 2 V
MDC IDC SET LEADCHNL RV PACING PULSEWIDTH: 0.4 ms
MDC IDC STAT BRADY AP VS PERCENT: 0 %
MDC IDC STAT BRADY AS VP PERCENT: 0 %

## 2016-07-15 ENCOUNTER — Emergency Department (HOSPITAL_COMMUNITY): Payer: Medicare Other

## 2016-07-15 ENCOUNTER — Encounter (HOSPITAL_COMMUNITY): Payer: Self-pay

## 2016-07-15 ENCOUNTER — Observation Stay (HOSPITAL_COMMUNITY)
Admission: EM | Admit: 2016-07-15 | Discharge: 2016-07-16 | Disposition: A | Discharge status: Home / Self Care | Payer: Medicare Other | Attending: Internal Medicine | Admitting: Internal Medicine

## 2016-07-15 DIAGNOSIS — G4730 Sleep apnea, unspecified: Secondary | ICD-10-CM | POA: Diagnosis present

## 2016-07-15 DIAGNOSIS — I4892 Unspecified atrial flutter: Secondary | ICD-10-CM | POA: Diagnosis present

## 2016-07-15 DIAGNOSIS — J449 Chronic obstructive pulmonary disease, unspecified: Secondary | ICD-10-CM | POA: Diagnosis not present

## 2016-07-15 DIAGNOSIS — Z7982 Long term (current) use of aspirin: Secondary | ICD-10-CM | POA: Insufficient documentation

## 2016-07-15 DIAGNOSIS — E162 Hypoglycemia, unspecified: Secondary | ICD-10-CM

## 2016-07-15 DIAGNOSIS — R188 Other ascites: Secondary | ICD-10-CM | POA: Diagnosis not present

## 2016-07-15 DIAGNOSIS — E11649 Type 2 diabetes mellitus with hypoglycemia without coma: Secondary | ICD-10-CM | POA: Diagnosis not present

## 2016-07-15 DIAGNOSIS — Z950 Presence of cardiac pacemaker: Secondary | ICD-10-CM | POA: Insufficient documentation

## 2016-07-15 DIAGNOSIS — Z79899 Other long term (current) drug therapy: Secondary | ICD-10-CM | POA: Insufficient documentation

## 2016-07-15 DIAGNOSIS — R1033 Periumbilical pain: Secondary | ICD-10-CM | POA: Diagnosis present

## 2016-07-15 DIAGNOSIS — R7981 Abnormal blood-gas level: Secondary | ICD-10-CM

## 2016-07-15 DIAGNOSIS — K219 Gastro-esophageal reflux disease without esophagitis: Secondary | ICD-10-CM | POA: Diagnosis present

## 2016-07-15 DIAGNOSIS — I10 Essential (primary) hypertension: Secondary | ICD-10-CM | POA: Diagnosis not present

## 2016-07-15 DIAGNOSIS — R109 Unspecified abdominal pain: Secondary | ICD-10-CM | POA: Diagnosis present

## 2016-07-15 DIAGNOSIS — E119 Type 2 diabetes mellitus without complications: Secondary | ICD-10-CM

## 2016-07-15 DIAGNOSIS — E039 Hypothyroidism, unspecified: Secondary | ICD-10-CM | POA: Diagnosis present

## 2016-07-15 LAB — GLUCOSE, CAPILLARY
GLUCOSE-CAPILLARY: 101 mg/dL — AB (ref 65–99)
Glucose-Capillary: 135 mg/dL — ABNORMAL HIGH (ref 65–99)

## 2016-07-15 LAB — URINALYSIS, ROUTINE W REFLEX MICROSCOPIC
GLUCOSE, UA: NEGATIVE mg/dL
Hgb urine dipstick: NEGATIVE
Leukocytes, UA: NEGATIVE
Nitrite: NEGATIVE
PH: 5.5 (ref 5.0–8.0)
Protein, ur: NEGATIVE mg/dL
SPECIFIC GRAVITY, URINE: 1.02 (ref 1.005–1.030)

## 2016-07-15 LAB — CBC WITH DIFFERENTIAL/PLATELET
Basophils Absolute: 0.1 10*3/uL (ref 0.0–0.1)
Basophils Relative: 0 %
EOS PCT: 0 %
Eosinophils Absolute: 0.1 10*3/uL (ref 0.0–0.7)
HCT: 46.7 % (ref 39.0–52.0)
Hemoglobin: 14.5 g/dL (ref 13.0–17.0)
LYMPHS ABS: 0.9 10*3/uL (ref 0.7–4.0)
LYMPHS PCT: 8 %
MCH: 29.5 pg (ref 26.0–34.0)
MCHC: 31 g/dL (ref 30.0–36.0)
MCV: 94.9 fL (ref 78.0–100.0)
MONO ABS: 1.3 10*3/uL — AB (ref 0.1–1.0)
MONOS PCT: 11 %
Neutro Abs: 9.2 10*3/uL — ABNORMAL HIGH (ref 1.7–7.7)
Neutrophils Relative %: 81 %
PLATELETS: 352 10*3/uL (ref 150–400)
RBC: 4.92 MIL/uL (ref 4.22–5.81)
RDW: 16.1 % — AB (ref 11.5–15.5)
WBC: 11.4 10*3/uL — ABNORMAL HIGH (ref 4.0–10.5)

## 2016-07-15 LAB — CBG MONITORING, ED
GLUCOSE-CAPILLARY: 75 mg/dL (ref 65–99)
Glucose-Capillary: 60 mg/dL — ABNORMAL LOW (ref 65–99)
Glucose-Capillary: 121 mg/dL — ABNORMAL HIGH (ref 65–99)

## 2016-07-15 LAB — PHOSPHORUS: Phosphorus: 3.1 mg/dL (ref 2.5–4.6)

## 2016-07-15 LAB — COMPREHENSIVE METABOLIC PANEL
ALBUMIN: 3 g/dL — AB (ref 3.5–5.0)
ALT: 82 U/L — ABNORMAL HIGH (ref 17–63)
AST: 121 U/L — AB (ref 15–41)
Alkaline Phosphatase: 534 U/L — ABNORMAL HIGH (ref 38–126)
Anion gap: 6 (ref 5–15)
BILIRUBIN TOTAL: 1.3 mg/dL — AB (ref 0.3–1.2)
BUN: 16 mg/dL (ref 6–20)
CO2: 32 mmol/L (ref 22–32)
Calcium: 8.8 mg/dL — ABNORMAL LOW (ref 8.9–10.3)
Chloride: 102 mmol/L (ref 101–111)
Creatinine, Ser: 0.84 mg/dL (ref 0.61–1.24)
GFR calc Af Amer: >60 mL/min (ref >60)
GFR calc non Af Amer: >60 mL/min (ref >60)
GLUCOSE: 51 mg/dL — AB (ref 65–99)
POTASSIUM: 3.4 mmol/L — AB (ref 3.5–5.1)
Sodium: 140 mmol/L (ref 135–145)
TOTAL PROTEIN: 6.7 g/dL (ref 6.5–8.1)

## 2016-07-15 LAB — TROPONIN I: Troponin I: <0.03 ng/mL (ref <0.03)

## 2016-07-15 LAB — PROTIME-INR
INR: 1.08
Prothrombin Time: 14 seconds (ref 11.4–15.2)

## 2016-07-15 LAB — LIPASE, BLOOD: Lipase: 18 U/L (ref 11–51)

## 2016-07-15 LAB — POC OCCULT BLOOD, ED: Fecal Occult Bld: NEGATIVE

## 2016-07-15 LAB — GLUCOSE, PERITONEAL FLUID: GLUCOSE, PERITONEAL FLUID: 94 mg/dL

## 2016-07-15 LAB — ALBUMIN, FLUID (OTHER): Albumin, Fluid: 1 g/dL

## 2016-07-15 LAB — MAGNESIUM: MAGNESIUM: 2.1 mg/dL (ref 1.7–2.4)

## 2016-07-15 MED ORDER — PANTOPRAZOLE SODIUM 40 MG PO TBEC
40.0000 mg | DELAYED_RELEASE_TABLET | Freq: Every day | ORAL | Status: DC
  Administered 2016-07-16: 40 mg via ORAL
  Filled 2016-07-15: qty 1

## 2016-07-15 MED ORDER — URSODIOL 500 MG PO TABS
500.0000 mg | ORAL_TABLET | Freq: Two times a day (BID) | ORAL | Status: DC
  Filled 2016-07-15: qty 1

## 2016-07-15 MED ORDER — MODAFINIL 200 MG PO TABS
200.0000 mg | ORAL_TABLET | Freq: Every day | ORAL | Status: DC
  Administered 2016-07-16: 200 mg via ORAL
  Filled 2016-07-15: qty 1

## 2016-07-15 MED ORDER — LOSARTAN POTASSIUM 50 MG PO TABS
25.0000 mg | ORAL_TABLET | Freq: Every day | ORAL | Status: DC
  Administered 2016-07-16: 25 mg via ORAL
  Filled 2016-07-15: qty 1

## 2016-07-15 MED ORDER — DEXTROSE 10 % IV SOLN
INTRAVENOUS | Status: DC
  Administered 2016-07-15 – 2016-07-16 (×2): via INTRAVENOUS
  Filled 2016-07-15: qty 1000

## 2016-07-15 MED ORDER — LORATADINE 10 MG PO TABS
10.0000 mg | ORAL_TABLET | Freq: Every day | ORAL | Status: DC
  Administered 2016-07-16: 10 mg via ORAL
  Filled 2016-07-15: qty 1

## 2016-07-15 MED ORDER — ALBUTEROL SULFATE (2.5 MG/3ML) 0.083% IN NEBU: 2.5000 mg | INHALATION_SOLUTION | Freq: Four times a day (QID) | RESPIRATORY_TRACT | Status: DC | PRN

## 2016-07-15 MED ORDER — ONDANSETRON HCL 4 MG/2ML IJ SOLN: 4.0000 mg | Freq: Four times a day (QID) | INTRAMUSCULAR | Status: DC | PRN

## 2016-07-15 MED ORDER — MORPHINE SULFATE (PF) 2 MG/ML IV SOLN
2.0000 mg | Freq: Once | INTRAVENOUS | Status: AC
  Administered 2016-07-15: 2 mg via INTRAVENOUS
  Filled 2016-07-15: qty 1

## 2016-07-15 MED ORDER — IOPAMIDOL (ISOVUE-370) INJECTION 76%: 100.0000 mL | Freq: Once | INTRAVENOUS | Status: DC | PRN

## 2016-07-15 MED ORDER — ONDANSETRON HCL 4 MG/2ML IJ SOLN
4.0000 mg | Freq: Once | INTRAMUSCULAR | Status: AC
  Administered 2016-07-15: 4 mg via INTRAVENOUS
  Filled 2016-07-15: qty 2

## 2016-07-15 MED ORDER — DEXTROSE 50 % IV SOLN
INTRAVENOUS | Status: AC
  Filled 2016-07-15: qty 50

## 2016-07-15 MED ORDER — ASPIRIN EC 325 MG PO TBEC
325.0000 mg | DELAYED_RELEASE_TABLET | Freq: Every day | ORAL | Status: DC
  Administered 2016-07-16: 325 mg via ORAL
  Filled 2016-07-15: qty 1

## 2016-07-15 MED ORDER — NITROGLYCERIN 0.4 MG SL SUBL: 0.4000 mg | SUBLINGUAL_TABLET | SUBLINGUAL | Status: DC | PRN

## 2016-07-15 MED ORDER — IOPAMIDOL (ISOVUE-300) INJECTION 61%
100.0000 mL | Freq: Once | INTRAVENOUS | Status: AC | PRN
Start: 2016-07-15 — End: 2016-07-15
  Administered 2016-07-15: 100 mL via INTRAVENOUS

## 2016-07-15 MED ORDER — METOPROLOL SUCCINATE ER 25 MG PO TB24
25.0000 mg | ORAL_TABLET | Freq: Every day | ORAL | Status: DC
  Administered 2016-07-16: 25 mg via ORAL
  Filled 2016-07-15: qty 1

## 2016-07-15 MED ORDER — MORPHINE SULFATE (PF) 2 MG/ML IV SOLN
2.0000 mg | INTRAVENOUS | Status: DC | PRN
  Administered 2016-07-16: 2 mg via INTRAVENOUS
  Filled 2016-07-15: qty 1

## 2016-07-15 MED ORDER — LEVOTHYROXINE SODIUM 50 MCG PO TABS
50.0000 ug | ORAL_TABLET | Freq: Every day | ORAL | Status: DC
  Administered 2016-07-16: 50 ug via ORAL
  Filled 2016-07-15: qty 1

## 2016-07-15 MED ORDER — SODIUM CHLORIDE 0.9% FLUSH
3.0000 mL | Freq: Two times a day (BID) | INTRAVENOUS | Status: DC
  Administered 2016-07-16: 3 mL via INTRAVENOUS

## 2016-07-15 MED ORDER — DEXTROSE 50 % IV SOLN
50.0000 mL | Freq: Once | INTRAVENOUS | Status: AC
  Administered 2016-07-15: 50 mL via INTRAVENOUS

## 2016-07-15 MED ORDER — ONDANSETRON HCL 4 MG PO TABS: 4.0000 mg | ORAL_TABLET | Freq: Four times a day (QID) | ORAL | Status: DC | PRN

## 2016-07-15 NOTE — ED Notes (Signed)
Attempted report, Receiving RN reported was unaware receiving pt. RN reported would call back.

## 2016-07-15 NOTE — ED Notes (Addendum)
Pt back in room from xray 

## 2016-07-15 NOTE — ED Notes (Addendum)
Per [name], paracentesis to be completed at bedside. Consent at bedside. [name] and Radiologist aware.

## 2016-07-15 NOTE — ED Notes (Addendum)
Pt reports is supposed to wear oxygen all the time but reports only uses it as needed. Pt o2 saturation on 2liters 86%. Pt oxygen increased to 88% on 3 liters. Pt oxygen increased to 4 liters, pt o2 saturation 91%. Moderate dyspnea noted with exertion.EDP aware. No new orders at this time.

## 2016-07-15 NOTE — ED Notes (Signed)
Pt oxygen decreased at time of rolling for rectal exam. EDP reported to increase pt oxygen to 5liters. Pt current o2 saturation 94%.

## 2016-07-15 NOTE — ED Notes (Signed)
Hospitalist at bedside 

## 2016-07-15 NOTE — ED Notes (Signed)
Pt 02 saturation on 5 liters 96%. Pt reports"im getting too much oxygen." Pt oxygen saturation decreased to 4 liters.

## 2016-07-15 NOTE — H&P (Signed)
History and Physical    [name] [id number] DOB: October 25, 1960 DOA: 07/15/2016  PCP: Petra Kuba, MD   Patient coming from: Home.  Chief Complaint: Abdominal pain.  HPI: [name] is a 55 y.o. male with medical history significant of etoh use, aortic stenosis, mitral regurgitation, atrial flutter/fibrillation, history of pacemaker placement in 1995/2005/2015, BPH/TURP, COPD, congenital deafness, type 2 diabetes, GERD, liver cirrhosis, ascites, hypertension, hyperlipidemia, sleep apnea, hypothyroidism who is brought to the ED via EMS due to abdominal pain. The patient was hypoglycemic in route to the hospital with a CBG of 45 mg/dL.   Per patient and his wife [name], who provides most of the history, the patient has been having decreased appetite, weight loss, abdominal distension for the past 2-3 months. This morning he woke up in his usual state of health. He was able to eat bacon and eggs for breakfast, then he went to school. While he was there, he developed periumbilical abdominal pain. His wife states that he felt warm to the touch, but he was afebrile when his temperature was checked with a thermometer. EMS was called and found the patient to be hypoglycemic with a CBG of 45 mg/dL. He was subsequently given glucagon x2 and was brought to the ED.  ED Course: In the ER, the patient received dextrose 50% due to persistent hypoglycemia. He was started on a dextrose 10% infusion. He underwent a paracentesis by interventional radiology on 4 L of ascitic fluid were removed. The patient stated that his pain felt a lot better after paracentesis was done.  Workup shows a WBC of 11.4, hemoglobin of 14.5 g/dL, platelets of 352. His lipase, troponin and PT/INR were normal. Potassium was 3.4 mmol/L and glucose 51 mg/dL. Urinalysis shows trace ketones.  Imaging: Chest radiographs show cardiomegaly with long lung volumes. CT scan abdomen/pelvis showed large ascites with cirrhotic  liver and mild splenomegaly.   Review of Systems: As per HPI otherwise 10 point review of systems negative.    Past Medical History:  Diagnosis Date  . Alcohol use   . Anemia   . Aortic stenosis    mild  . Atrial flutter ([place])   . Benign hypertrophy of prostate    with urinary retention; repaired hypospadia; transurethral resection of the prostate   . Cardiac conduction disorder    status post pacemaker implantation in 1995 generator replaced 2005  . Chest pain 2007, 2009   cardiac cath > normal coronary arterie; nl left ventricular function  . Congenital deafness   . COPD (chronic obstructive pulmonary disease) ([place])   . Diabetes mellitus    +Insulin  . Edema   . GERD (gastroesophageal reflux disease)    status post multiple dilatations for stricture  . 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 HTN (hypertension)   . Hyperlipidemia    statin discontinued due to abn LFTs  . Mitral regurgitation    insignificant  . Obesity   . Obesity 4/08   BMI= 40  . Pelvic mass    identified in 2009, stable 2010  . Sleep apnea    BiPAP and continuous oxygen  . Syncope   . Thyroid disease     Past Surgical History:  Procedure Laterality Date  . A-V CARDIAC PACEMAKER INSERTION  1995  . COLONOSCOPY  2008  . COLONOSCOPY N/A 06/23/2015   [id number] diverticulosis  . ESOPHAGOGASTRODUODENOSCOPY N/A 06/23/2015   [id number] portal gastropathy  . ORCHIECTOMY  1990   bilateral; ?neoplasm  . PACEMAKER INSERTION  05/2004; 10/04/2013   MDT dual chamber pacemaker; gen change 10/04/2013 (MDT ADDRL1)  . PERMANENT PACEMAKER GENERATOR CHANGE N/A 10/04/2013   Procedure: PERMANENT PACEMAKER GENERATOR CHANGE;  Surgeon: Evans Lance, MD;  Location: [facility] CATH LAB;  Service: Cardiovascular;  Laterality: N/A;  . REPAIR HYPOSPADIAS W/ URETHROPLASTY    . TONSILLECTOMY AND ADENOIDECTOMY    . TRANSURETHRAL RESECTION OF PROSTATE     for BPH      reports that he has never smoked. He has never used smokeless tobacco. He reports that he does not drink alcohol or use drugs.  Allergies  Allergen Reactions  . Ciprofloxacin     Itching and red rash up arm when infusing after 3rd cipro dose for uti on 10/27/13  . Eliquis [Apixaban] Other (See Comments)    bleeding  . Enalapril Other (See Comments)    Cough  . Metformin And Related     Severe diaarhea  . Penicillins Other (See Comments)    Reaction unspecified    Family History  Problem Relation Age of Onset  . COPD Mother   . Hypertension Mother   . Congestive Heart Failure Mother   . Pneumonia Father   . Heart disease Other   . Hypertension Other   . Colon cancer Neg Hx     Prior to Admission medications   Medication Sig Start Date End Date Taking? Authorizing Provider  aspirin 325 MG EC tablet Take 325 mg by mouth daily.   Yes Historical Provider, MD  cetirizine (ZYRTEC) 10 MG tablet Take 10 mg by mouth daily.   Yes Historical Provider, MD  Glucosamine-Chondroit-Vit C-Mn (GLUCOSAMINE CHONDR 1500 COMPLX PO) Take 1 tablet by mouth every morning.   Yes Historical Provider, MD  HUMALOG MIX 75/25 KWIKPEN (75-25) 100 UNIT/ML Kwikpen 20 units in the morning and 25 units in the evening Patient taking differently: 15 Units. 15 units in the morning and 15 units in the evening 03/02/16  Yes Reyne Dumas, MD  levothyroxine (LEVOXYL) 50 MCG tablet Take 50 mcg by mouth daily.     Yes Historical Provider, MD  losartan (COZAAR) 25 MG tablet Take 25 mg by mouth daily.   Yes Historical Provider, MD  metoprolol succinate (TOPROL-XL) 25 MG 24 hr tablet Take 25 mg by mouth daily.   Yes Historical Provider, MD  modafinil (PROVIGIL) 200 MG tablet Take 200 mg by mouth daily.  01/13/15  Yes Historical Provider, MD  mometasone (NASONEX) 50 MCG/ACT nasal spray Place 2 sprays into the nose daily as needed (FOR CONGESTION/ALLERGIES).   Yes Historical Provider, MD  Multiple Vitamins-Minerals (CENTRUM) tablet  Take 1 tablet by mouth daily.     Yes Historical Provider, MD  nitroGLYCERIN (NITROSTAT) 0.4 MG SL tablet Place 1 tablet (0.4 mg total) under the tongue every 5 (five) minutes as needed for chest pain. 06/26/15  Yes Lendon Colonel, NP  Omega-3 Fatty Acids (FISH OIL) 1000 MG CAPS Take 1 capsule by mouth daily.   Yes Historical Provider, MD  omeprazole (PRILOSEC) 40 MG capsule TAKE 1 CAPSULE BY MOUTH ONCE DAILY FOR REFLUX. 12/23/15  Yes Arnoldo Lenis, MD  ursodiol (URSO FORTE) 500 MG tablet Take 1 tablet (500 mg total) by mouth 2 (two) times daily. 06/14/16  Yes Carlis Stable, NP  albuterol (PROVENTIL) (2.5 MG/3ML) 0.083% nebulizer solution Take 2.5 mg by nebulization every 6 (six) hours as needed for wheezing or shortness of breath.  Historical Provider, MD  albuterol (VENTOLIN HFA) 108 (90 BASE) MCG/ACT inhaler Inhale 2 puffs into the lungs every 6 (six) hours as needed for wheezing.     Historical Provider, MD    Physical Exam:  Constitutional: NAD, calm, comfortable Vitals:   07/15/16 1400 07/15/16 1425 07/15/16 1500 07/15/16 1530  BP: 107/92  134/74 133/75  Pulse: 75  79 76  Resp: (!) 27  24 26   Temp:  97.7 F (36.5 C)    TempSrc:  Oral    SpO2: (!) 89%  (!) 88% 93%   Eyes: PERRL, lids and conjunctivae normal ENMT: Mucous membranes are moist. Posterior pharynx clear of any exudate or lesions. Poor state of repair of dentition.  Neck: normal, supple, no masses, no thyromegaly Respiratory: clear to auscultation bilaterally, no wheezing, no crackles. Normal respiratory effort. No accessory muscle use.  Cardiovascular: Regular rate and rhythm, + SEM, no rubs / gallops. No extremity edema. 2+ pedal pulses. No carotid bruits.  Abdomen: Mildly distended, dressing from paracentesis, bowel sounds positive. Soft, mild RUQ and periumbilical tenderness, no guarding/rebound/masses palpated. No hepatosplenomegaly.  Musculoskeletal: no clubbing / cyanosis. No joint deformity upper and lower  extremities. Good ROM, no contractures. Normal muscle tone.  Skin:Hyperpigmentation of lower and mid pretibial areas with healing small abrasions on right. Onychomycosis of nails. Neurologic: CN 2-12 grossly intact. Sensation intact, DTR normal. Strength 5/5 in all 4.  Psychiatric: Normal judgment and insight. Alert and oriented x 4. Normal mood.     Labs on Admission: I have personally reviewed following labs and imaging studies  CBC:  Recent Labs Lab 07/15/16 1256  WBC 11.4*  NEUTROABS 9.2*  HGB 14.5  HCT 46.7  MCV 94.9  PLT [id number]   Basic Metabolic Panel:  Recent Labs Lab 07/15/16 1256  NA 140  K 3.4*  CL 102  CO2 32  GLUCOSE 51*  BUN 16  CREATININE 0.84  CALCIUM 8.8*   GFR: CrCl cannot be calculated (Unknown ideal weight.). Liver Function Tests:  Recent Labs Lab 07/15/16 1256  AST 121*  ALT 82*  ALKPHOS 534*  BILITOT 1.3*  PROT 6.7  ALBUMIN 3.0*    Recent Labs Lab 07/15/16 1256  LIPASE 18   No results for input(s): AMMONIA in the last 168 hours. Coagulation Profile:  Recent Labs Lab 07/15/16 1256  INR 1.08   Cardiac Enzymes:  Recent Labs Lab 07/15/16 1256  TROPONINI <0.03   BNP (last 3 results) No results for input(s): PROBNP in the last 8760 hours. HbA1C: No results for input(s): HGBA1C in the last 72 hours. CBG:  Recent Labs Lab 07/15/16 1244 07/15/16 1535  GLUCAP 60* 121*   Lipid Profile: No results for input(s): CHOL, HDL, LDLCALC, TRIG, CHOLHDL, LDLDIRECT in the last 72 hours. Thyroid Function Tests: No results for input(s): TSH, T4TOTAL, FREET4, T3FREE, THYROIDAB in the last 72 hours. Anemia Panel: No results for input(s): VITAMINB12, FOLATE, FERRITIN, TIBC, IRON, RETICCTPCT in the last 72 hours. Urine analysis:    Component Value Date/Time   COLORURINE YELLOW 07/15/2016 Power 07/15/2016 1358   LABSPEC 1.020 07/15/2016 1358   PHURINE 5.5 07/15/2016 1358   GLUCOSEU NEGATIVE 07/15/2016 1358    HGBUR NEGATIVE 07/15/2016 1358   BILIRUBINUR SMALL (A) 07/15/2016 1358   KETONESUR TRACE (A) 07/15/2016 1358   PROTEINUR NEGATIVE 07/15/2016 1358   UROBILINOGEN 0.2 05/13/2015 1315   NITRITE NEGATIVE 07/15/2016 1358   LEUKOCYTESUR NEGATIVE 07/15/2016 1358    Radiological Exams on Admission: Dg Chest  1 View  Result Date: 07/15/2016 CLINICAL DATA:  Abdominal pain. Short of breath with low oxygen saturation. EXAM: CHEST 1 VIEW COMPARISON:  02/29/2016 FINDINGS: Pacer with leads at right atrium and right ventricle. No lead discontinuity. Apical lordotic positioning. Midline trachea. Cardiomegaly accentuated by AP portable technique. No pleural effusion or pneumothorax. No congestive failure. Clear lungs. Mildly low lung volumes. No free intraperitoneal air. IMPRESSION: Cardiomegaly and low lung volumes.  No acute findings. Electronically Signed   By: Abigail Miyamoto M.D.   On: 07/15/2016 13:40   Ct Abdomen Pelvis W Contrast  Result Date: 07/15/2016 CLINICAL DATA:  55 y/o M; decreasing appetite and increasing abdominal distention for the past week. History of cirrhosis. EXAM: CT ABDOMEN AND PELVIS WITH CONTRAST TECHNIQUE: Multidetector CT imaging of the abdomen and pelvis was performed using the standard protocol following bolus administration of intravenous contrast. CONTRAST:  17mL ISOVUE-300 IOPAMIDOL (ISOVUE-300) INJECTION 61% COMPARISON:  06/23/2016 abdominal ultrasound. 02/29/2016 CT abdomen and pelvis. FINDINGS: Lower chest: Small right pleural effusion and bibasilar atelectasis. Mild cardiomegaly. Aortic valvular calcification. Hepatobiliary: Cirrhotic liver. No focal liver lesion identified. No intra or extrahepatic biliary ductal dilatation. Pancreas: Unremarkable. No pancreatic ductal dilatation or surrounding inflammatory changes. Spleen: Mild splenomegaly. Adrenals/Urinary Tract: No focal renal lesion, urinary stone, or obstructive uropathy. Multiple cortical scars of the right kidney. Normal  bladder. Stomach/Bowel: Stomach is within normal limits. Appendix appears normal. No evidence of bowel wall thickening, distention, or inflammatory changes. Vascular/Lymphatic: Aortic atherosclerosis. No enlarged abdominal or pelvic lymph nodes. Reproductive: Prostate is unremarkable. Other: Large volume of peritoneal ascites has markedly increased in comparison with prior CT. Musculoskeletal: No acute or significant osseous findings. IMPRESSION: Larger volume of peritoneal ascites is markedly progressed from prior CT of abdomen and pelvis. Otherwise no acute abnormality as explanation for abdominal pain. Cirrhotic liver and mild splenomegaly. Electronically Signed   By: Kristine Garbe M.D.   On: 07/15/2016 15:06   Echocardiogram 02/12/2015  ------------------------------------------------------------------- LV EF: 60% -   65%  ------------------------------------------------------------------- Indications:      Aortic stenosis 424.1.  ------------------------------------------------------------------- History:   PMH:   Aortic valve disease.  ------------------------------------------------------------------- Study Conclusions  - Left ventricle: The cavity size was normal. Wall thickness was   increased in a pattern of mild LVH. Systolic function was normal.   The estimated ejection fraction was in the range of 60% to 65%.   Wall motion was normal; there were no regional wall motion   abnormalities. Left ventricular diastolic function parameters   were normal for the patient&'s age. - Ventricular septum: The contour showed diastolic flattening and   systolic flattening. - Aortic valve: Mildly to moderately calcified annulus. Trileaflet;   moderately calcified leaflets. There was severe stenosis. Mean   gradient (S): 31 mm Hg. Peak gradient (S): 54 mm Hg. VTI ratio of   LVOT to aortic valve: 0.22. - Mitral valve: There was trivial regurgitation. - Right ventricle: The  cavity size was mildly dilated. Pacer wire   or catheter noted in right ventricle. - Right atrium: The atrium was mildly dilated. Pacer wire or   catheter noted in right atrium. Central venous pressure (est): 15   mm Hg. - Tricuspid valve: There was mild regurgitation. - Pulmonary arteries: PA peak pressure: 38 mm Hg (S). - Pericardium, extracardiac: A trivial pericardial effusion was   identified posterior to the heart.  Impressions:  - Mild LVH with LVEF 60-65%, grossly normal diastolic function for   age. Overall severe calcific aortic stenosis with incomplete  fusion of the left and right coronary cusps - parameters outlined   above with increase in mean gradient to 31 mmHg. Device wire   noted in right heart. There is mild RV enlargement and LV septal   flattening, PASP 38 mmHg based on incomplete TR envelope. Trivial   pericardial effusion.  -------------------------------------------------------------------------------------------------------------  EKG: Independently reviewed. Vent. rate 72 BPM PR interval * ms QRS duration 140 ms QT/QTc 438/480 ms P-R-T axes 70 -29 -22 Sinus rhythm Multiform ventricular premature complexes Prolonged PR interval Nonspecific intraventricular conduction delay Inferior infarct, recent Since last tracing rate slower  Assessment/Plan Principal Problem:   Hypoglycemia Observation/Telemetry. Start dextrose 10% infusion. Hold insulin. CBG monitoring every 2 hours. Hypoglycemia protocol. Discontinue dextrose infusion once CBG trending up.  Active Problems:   Abdominal pain   Ascites Much better per patient after paracentesis. Check peritoneal fluid cytology and chemistry. Continue analgesics and antiemetics as needed.    Type 2 diabetes mellitus ([facility]) Carbohydrate modified diet. Hold insulin at this time due to hypoglycemia.    GASTROESOPHAGEAL REFLUX DISEASE Single dose famotidine 20 mg IVPB in the ED. Protonix 40 mg po  daily.    Hypertension Continue losartan 25 mg by mouth daily. Continue metoprolol ER 25 mg by mouth daily. Monitor blood pressure.    Sleep apnea Continue Provigil. Continue nocturnal BiPAP.    Atrial flutter ([facility])  Cha2 DS2 Vasc score of at least 3. History of bleeding secondary to Eliquis Continue ASA and beta blocker       Hypothyroidism Continue levothyroxine 50 mcg po daily.    DVT prophylaxis: SCDs Code Status: Full code. Family Communication: His wife [name] was present in the room. Disposition Plan: Admit for hypoglycemia treatment and close CBG monitoring. Consults called: IR for US guided paracentesis. Admission status: Observation/Telemetry.   Reubin Milan MD [facility] Pager [phone].  If 7PM-7AM, please contact night-coverage [email or web address] Password [email or web address]  07/15/2016, 3:59 PM

## 2016-07-15 NOTE — ED Notes (Signed)
POC hemoccult negative. EDP aware.

## 2016-07-15 NOTE — ED Notes (Signed)
Pt given po contrast for CT. Pt repositioned.

## 2016-07-15 NOTE — ED Triage Notes (Signed)
Per EMS on  initial assessment pt had CBG of 45. Pt was given two tubes of glucagon and repeat CBG was 45. Pt also, complain of abdominal pain.

## 2016-07-15 NOTE — ED Provider Notes (Signed)
[facility] DEPT Provider Note   CSN: [id number] Arrival date & time: 07/15/16  1238  By signing my name below, I, Rayna Sexton, attest that this documentation has been prepared under the direction and in the presence of Orlie Dakin, MD. Electronically Signed: Rayna Sexton, ED Scribe. 07/15/16. 1:10 PM.   History   Chief Complaint Chief Complaint  Patient presents with  . Abdominal Pain    HPI HPI Comments: [name] is a 55 y.o. male with a h/o IDDM who presents to the Emergency Department by ambulance complaining of centralized, worsening, mild, abdominal pain onset this morning.  Per EMS, his CBG en-route was 45 and he was given two tubes of glucagon. His wife states he has experienced an intermittent decreased appetite for 2-3 months as well as abdominal distension for the past week. Pt also notes a subjective fever. He has not taken anything for his pain.No treatment prior to coming here. Pain described as 2 on a scale of 1-10 present and is mild. No nausea or vomiting. 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 Abdomen has become progressively more distended over the past 3 months He does not consume ETOH or use illegal narcotics. He is on home O2 (5L/min at night and 2-3L/min during the day) due to a h/o COPD but his wife states he is sometimes non-compliant. Pt was never a smoker. He confirms his listed PCP. He ate a normal breakfast this morning and had a nml BM this morning. She confirms his listed allergies. He denies nausea, vomiting, bloody stools, rectal bleeding and dysuria.  The history is provided by the patient and the spouse. No language interpreter was used.    Past Medical History:  Diagnosis Date  . Alcohol use   . Anemia   . Aortic stenosis    mild  . Atrial flutter ([place])   . Benign hypertrophy of prostate    with urinary retention; repaired hypospadia; transurethral  resection of the prostate   . Cardiac conduction disorder    status post pacemaker implantation in 1995 generator replaced 2005  . Chest pain 2007, 2009   cardiac cath > normal coronary arterie; nl left ventricular function  . Congenital deafness   . COPD (chronic obstructive pulmonary disease) ([place])   . Diabetes mellitus    +Insulin  . Edema   . GERD (gastroesophageal reflux disease)    status post multiple dilatations for stricture  . 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 HTN (hypertension)   . Hyperlipidemia    statin discontinued due to abn LFTs  . Mitral regurgitation    insignificant  . Obesity   . Obesity 4/08   BMI= 40  . Pelvic mass    identified in 2009, stable 2010  . Sleep apnea    BiPAP and continuous oxygen  . Syncope   . Thyroid disease     Patient Active Problem List   Diagnosis Date Noted  . Elevated alkaline phosphatase level 06/14/2016  . Elevated LFTs 06/14/2016  . Ascites   . Diarrhea 02/29/2016  . Abnormal liver function 02/29/2016  . Atrial flutter ([place]) 02/29/2016  . Hypoglycemia 02/29/2016  . Abdominal pain 02/29/2016  . Jejunitis   . Adenomatous colon polyp 09/01/2015  . Portal hypertensive gastropathy   . Heme + stool 06/02/2015  . Leukocytosis, unspecified 10/26/2013  .  Hematuria 10/26/2013  . UTI (lower urinary tract infection) 10/25/2013  . HTN (hypertension)   . Atrial fibrillation ([place]) 10/19/2013  . Chronic diastolic heart failure ([place]) 09/19/2013  . Chest pain   . Aortic stenosis   . Hypertension   . Hyperlipidemia   . Pelvic mass   . Hepatic cirrhosis ([place])   . Benign hypertrophy of prostate   . Sleep apnea   . GASTROESOPHAGEAL REFLUX DISEASE 05/19/2010  . Cardiac pacemaker in situ 03/18/2010  . DIABETES MELLITUS, TYPE II, ON INSULIN 06/27/2008  . ANEMIA, MILD 06/27/2008  . DEAFNESS, CONGENITAL 06/27/2008  . CONDUCTION DISORDER OF THE HEART 06/27/2008     Past Surgical History:  Procedure Laterality Date  . A-V CARDIAC PACEMAKER INSERTION  1995  . COLONOSCOPY  2008  . COLONOSCOPY N/A 06/23/2015   [id number] diverticulosis  . ESOPHAGOGASTRODUODENOSCOPY N/A 06/23/2015   [id number] portal gastropathy  . ORCHIECTOMY  1990   bilateral; ?neoplasm  . PACEMAKER INSERTION  05/2004; 10/04/2013   MDT dual chamber pacemaker; gen change 10/04/2013 (MDT ADDRL1)  . PERMANENT PACEMAKER GENERATOR CHANGE N/A 10/04/2013   Procedure: PERMANENT PACEMAKER GENERATOR CHANGE;  Surgeon: Evans Lance, MD;  Location: [facility] CATH LAB;  Service: Cardiovascular;  Laterality: N/A;  . REPAIR HYPOSPADIAS W/ URETHROPLASTY    . TONSILLECTOMY AND ADENOIDECTOMY    . TRANSURETHRAL RESECTION OF PROSTATE     for BPH       Home Medications    Prior to Admission medications   Medication Sig Start Date End Date Taking? Authorizing Provider  albuterol (PROVENTIL) (2.5 MG/3ML) 0.083% nebulizer solution Take 2.5 mg by nebulization every 6 (six) hours as needed for wheezing or shortness of breath.    Historical Provider, MD  albuterol (VENTOLIN HFA) 108 (90 BASE) MCG/ACT inhaler Inhale 2 puffs into the lungs every 6 (six) hours as needed for wheezing.     Historical Provider, MD  aspirin 325 MG EC tablet Take 325 mg by mouth daily.    Historical Provider, MD  cetirizine (ZYRTEC) 10 MG tablet Take 10 mg by mouth daily.    Historical Provider, MD  Glucosamine-Chondroit-Vit C-Mn (GLUCOSAMINE CHONDR 1500 COMPLX PO) Take 1 tablet by mouth every morning.    Historical Provider, MD  HUMALOG MIX 75/25 KWIKPEN (75-25) 100 UNIT/ML Kwikpen 20 units in the morning and 25 units in the evening Patient taking differently: 15 Units. 15 units in the morning and 15 units in the evening 03/02/16   Reyne Dumas, MD  levothyroxine (LEVOXYL) 50 MCG tablet Take 50 mcg by mouth daily.      Historical Provider, MD  losartan (COZAAR) 25 MG tablet Take 25 mg by mouth daily.    Historical Provider, MD   metoprolol succinate (TOPROL-XL) 25 MG 24 hr tablet Take 25 mg by mouth daily.    Historical Provider, MD  modafinil (PROVIGIL) 200 MG tablet Take 200 mg by mouth daily.  01/13/15   Historical Provider, MD  mometasone (NASONEX) 50 MCG/ACT nasal spray Place 2 sprays into the nose daily as needed (FOR CONGESTION/ALLERGIES).    Historical Provider, MD  Multiple Vitamins-Minerals (CENTRUM) tablet Take 1 tablet by mouth daily.      Historical Provider, MD  nitroGLYCERIN (NITROSTAT) 0.4 MG SL tablet Place 1 tablet (0.4 mg total) under the tongue every 5 (five) minutes as needed for chest pain. 06/26/15   Lendon Colonel, NP  Omega-3 Fatty Acids (FISH OIL) 1000 MG CAPS Take 1 capsule by mouth daily.    Historical Provider, MD  omeprazole (PRILOSEC) 40 MG capsule TAKE 1 CAPSULE BY MOUTH ONCE DAILY FOR REFLUX. 12/23/15   Arnoldo Lenis, MD  ursodiol (URSO FORTE) 500 MG tablet Take 1 tablet (500 mg total) by mouth 2 (two) times daily. 06/14/16   Carlis Stable, NP    Family History Family History  Problem Relation Age of Onset  . COPD Mother   . Hypertension Mother   . Congestive Heart Failure Mother   . Pneumonia Father   . Heart disease Other   . Hypertension Other   . Colon cancer Neg Hx     Social History Social History  Substance Use Topics  . Smoking status: Never Smoker  . Smokeless tobacco: Never Used     Comment: tobacco use- no   . Alcohol use No     Allergies   Ciprofloxacin; Eliquis [apixaban]; Enalapril; Metformin and related; and Penicillins   Review of Systems Review of Systems  Constitutional: Positive for appetite change and fever.  HENT: Negative.   Respiratory: Negative.   Cardiovascular: Negative.   Gastrointestinal: Positive for abdominal distention and abdominal pain.  Musculoskeletal: Negative.   Skin: Negative.   Allergic/Immunologic: Positive for immunocompromised state.       Diabetic  Neurological: Negative.   Psychiatric/Behavioral: Negative.   All  other systems reviewed and are negative.   Physical Exam Updated Vital Signs BP 142/95   Pulse 76   Resp (!) 27   SpO2 91%   Physical Exam  Constitutional: No distress.  Chronically ill-appearing  HENT:  Head: Normocephalic and atraumatic.  Eyes: Conjunctivae are normal. Pupils are equal, round, and reactive to light.  Neck: Neck supple. No tracheal deviation present. No thyromegaly present.  Cardiovascular: Normal rate and regular rhythm.   No murmur heard. Pulmonary/Chest: Effort normal and breath sounds normal.  Abdominal: Soft. Bowel sounds are normal. He exhibits distension. There is no tenderness.  Minimally tender at epigastrium  Genitourinary: Penis normal. Rectal exam shows guaiac negative stool.  Genitourinary Comments: Rectal normal tone and soft brown stool no gross blood  Musculoskeletal: Normal range of motion. He exhibits edema. He exhibits no tenderness.  Trace pretibial pitting edema bilaterally  Neurological: He is alert. Coordination normal.  Skin: Skin is warm and dry. No rash noted.  Psychiatric: He has a normal mood and affect.  Nursing note and vitals reviewed.   ED Treatments / Results  Labs (all labs ordered are listed, but only abnormal results are displayed) Labs Reviewed  CBG MONITORING, ED - Abnormal; Notable for the following:       Result Value   Glucose-Capillary 60 (*)    All other components within normal limits  LIPASE, BLOOD  COMPREHENSIVE METABOLIC PANEL  CBC    EKG  EKG Interpretation  Date/Time:  Thursday July 15 2016 12:39:28 EDT Ventricular Rate:  72 PR Interval:    QRS Duration: 140 QT Interval:  438 QTC Calculation: 480 R Axis:   -29 Text Interpretation:  Sinus rhythm Multiform ventricular premature complexes Prolonged PR interval Nonspecific intraventricular conduction delay Inferior infarct, recent Since last tracing rate slower Confirmed by Winfred Leeds  MD, Yavier Snider [id number]) on 07/15/2016 12:51:11 PM      Results for  orders placed or performed during the hospital encounter of 07/15/16  Comprehensive metabolic panel  Result Value Ref Range   Sodium 140 135 - 145 mmol/L   Potassium 3.4 (L) 3.5 - 5.1 mmol/L   Chloride 102 101 - 111 mmol/L   CO2 32 22 - 32  mmol/L   Glucose, Bld 51 (L) 65 - 99 mg/dL   BUN 16 6 - 20 mg/dL   Creatinine, Ser 0.84 0.61 - 1.24 mg/dL   Calcium 8.8 (L) 8.9 - 10.3 mg/dL   Total Protein 6.7 6.5 - 8.1 g/dL   Albumin 3.0 (L) 3.5 - 5.0 g/dL   AST 121 (H) 15 - 41 U/L   ALT 82 (H) 17 - 63 U/L   Alkaline Phosphatase 534 (H) 38 - 126 U/L   Total Bilirubin 1.3 (H) 0.3 - 1.2 mg/dL   GFR calc non Af Amer >60 >60 mL/min   GFR calc Af Amer >60 >60 mL/min   Anion gap 6 5 - 15  CBC with Differential/Platelet  Result Value Ref Range   WBC 11.4 (H) 4.0 - 10.5 K/uL   RBC 4.92 4.22 - 5.81 MIL/uL   Hemoglobin 14.5 13.0 - 17.0 g/dL   HCT 46.7 39.0 - 52.0 %   MCV 94.9 78.0 - 100.0 fL   MCH 29.5 26.0 - 34.0 pg   MCHC 31.0 30.0 - 36.0 g/dL   RDW 16.1 (H) 11.5 - 15.5 %   Platelets 352 150 - 400 K/uL   Neutrophils Relative % 81 %   Neutro Abs 9.2 (H) 1.7 - 7.7 K/uL   Lymphocytes Relative 8 %   Lymphs Abs 0.9 0.7 - 4.0 K/uL   Monocytes Relative 11 %   Monocytes Absolute 1.3 (H) 0.1 - 1.0 K/uL   Eosinophils Relative 0 %   Eosinophils Absolute 0.1 0.0 - 0.7 K/uL   Basophils Relative 0 %   Basophils Absolute 0.1 0.0 - 0.1 K/uL  Lipase, blood  Result Value Ref Range   Lipase 18 11 - 51 U/L  Urinalysis, Routine w reflex microscopic (not at [name])  Result Value Ref Range   Color, Urine YELLOW YELLOW   APPearance CLEAR CLEAR   Specific Gravity, Urine 1.020 1.005 - 1.030   pH 5.5 5.0 - 8.0   Glucose, UA NEGATIVE NEGATIVE mg/dL   Hgb urine dipstick NEGATIVE NEGATIVE   Bilirubin Urine SMALL (A) NEGATIVE   Ketones, ur TRACE (A) NEGATIVE mg/dL   Protein, ur NEGATIVE NEGATIVE mg/dL   Nitrite NEGATIVE NEGATIVE   Leukocytes, UA NEGATIVE NEGATIVE  Troponin I  Result Value Ref Range   Troponin  I <0.03 <0.03 ng/mL  Protime-INR  Result Value Ref Range   Prothrombin Time 14.0 11.4 - 15.2 seconds   INR 1.08   CBG monitoring, ED  Result Value Ref Range   Glucose-Capillary 60 (L) 65 - 99 mg/dL  POC occult blood, ED  Result Value Ref Range   Fecal Occult Bld NEGATIVE NEGATIVE  CBG monitoring, ED  Result Value Ref Range   Glucose-Capillary 121 (H) 65 - 99 mg/dL   Dg Chest 1 View  Result Date: 07/15/2016 CLINICAL DATA:  Abdominal pain. Short of breath with low oxygen saturation. EXAM: CHEST 1 VIEW COMPARISON:  02/29/2016 FINDINGS: Pacer with leads at right atrium and right ventricle. No lead discontinuity. Apical lordotic positioning. Midline trachea. Cardiomegaly accentuated by AP portable technique. No pleural effusion or pneumothorax. No congestive failure. Clear lungs. Mildly low lung volumes. No free intraperitoneal air. IMPRESSION: Cardiomegaly and low lung volumes.  No acute findings. Electronically Signed   By: Abigail Miyamoto M.D.   On: 07/15/2016 13:40   Ct Abdomen Pelvis W Contrast  Result Date: 07/15/2016 CLINICAL DATA:  55 y/o M; decreasing appetite and increasing abdominal distention for the past week. History of cirrhosis.  EXAM: CT ABDOMEN AND PELVIS WITH CONTRAST TECHNIQUE: Multidetector CT imaging of the abdomen and pelvis was performed using the standard protocol following bolus administration of intravenous contrast. CONTRAST:  138mL ISOVUE-300 IOPAMIDOL (ISOVUE-300) INJECTION 61% COMPARISON:  06/23/2016 abdominal ultrasound. 02/29/2016 CT abdomen and pelvis. FINDINGS: Lower chest: Small right pleural effusion and bibasilar atelectasis. Mild cardiomegaly. Aortic valvular calcification. Hepatobiliary: Cirrhotic liver. No focal liver lesion identified. No intra or extrahepatic biliary ductal dilatation. Pancreas: Unremarkable. No pancreatic ductal dilatation or surrounding inflammatory changes. Spleen: Mild splenomegaly. Adrenals/Urinary Tract: No focal renal lesion, urinary  stone, or obstructive uropathy. Multiple cortical scars of the right kidney. Normal bladder. Stomach/Bowel: Stomach is within normal limits. Appendix appears normal. No evidence of bowel wall thickening, distention, or inflammatory changes. Vascular/Lymphatic: Aortic atherosclerosis. No enlarged abdominal or pelvic lymph nodes. Reproductive: Prostate is unremarkable. Other: Large volume of peritoneal ascites has markedly increased in comparison with prior CT. Musculoskeletal: No acute or significant osseous findings. IMPRESSION: Larger volume of peritoneal ascites is markedly progressed from prior CT of abdomen and pelvis. Otherwise no acute abnormality as explanation for abdominal pain. Cirrhotic liver and mild splenomegaly. Electronically Signed   By: Kristine Garbe M.D.   On: 07/15/2016 15:06   US Abdomen Limited Ruq  Result Date: 06/23/2016 CLINICAL DATA:  Cirrhosis, elevated alkaline phosphatase, hepatocellular carcinoma surveillance EXAM: US ABDOMEN LIMITED - RIGHT UPPER QUADRANT COMPARISON:  Abdominal ultrasound of March 11, 2016 FINDINGS: Gallbladder: The gallbladder is adequately distended. The wall is mildly thickened at 4 mm which is slightly more conspicuous than on the previous study. There is ascites present which may be responsible for this. There is no evidence of gallstones or sludge. There is no positive sonographic Murphy's sign. Common bile duct: Diameter: 4 mm Liver: Increased hepatic echotexture. The liver appears shrunken. The surface contour is irregular. There is no discrete mass or intrahepatic ductal dilation. IMPRESSION: Hepatic cirrhosis without evidence of masses or ductal dilation. Mild gallbladder wall thickening without evidence of stones or sludge or positive sonographic Murphy's sign. Moderate amount of ascites. Electronically Signed   By: David  Martinique M.D.   On: 06/23/2016 08:30    Radiology No results found.  Procedures Procedures  COORDINATION OF  CARE: 1:09 PM Discussed next steps with pt. Pt verbalized understanding and is agreeable with the plan.    Medications Ordered in ED Medications - No data to display  Vomited while here. 350PM he is asymptomatic after treatment with intravenous antiemetics and intravenous opioids. Ultrasound-guided paracentesis ordered. I consulted Dr. Olevia Bowens from hospital service who will see patient in ED and make arrangements for overnight stay. Patient was also treated with intravenous D50 for hypoglycemia. Blood glucose post treatment with D50, 121, normal Initial Impression / Assessment and Plan / ED Course  I have reviewed the triage vital signs and the nursing notes.  Pertinent labs & imaging results that were available during my care of the patient were reviewed by me and considered in my medical decision making (see chart for details).  Clinical Course        Final Clinical Impressions(s) / ED Diagnoses  Diagnosis #1 abdominal pain #2 ascites #3 persistent hypoglycemia #4 nausea and vomiting #5 upper bilirubin anemia #6 chronically elevated LFTs Final diagnoses:  None    New Prescriptions New Prescriptions   No medications on file     Orlie Dakin, MD 07/15/16 1539

## 2016-07-15 NOTE — ED Notes (Signed)
[name] staff reported paracentesis completed. 4 liters removed during procedure.

## 2016-07-15 NOTE — ED Notes (Signed)
EDP aware of repeat CBG. nad noted. Pt alert and oriented at this time.

## 2016-07-15 NOTE — ED Notes (Addendum)
Paracentesis in process.

## 2016-07-15 NOTE — ED Notes (Signed)
Pharmacy aware that dextrose infusion not in stock in either ER pyxis. Pharmacy reported would send dose down.

## 2016-07-15 NOTE — ED Notes (Addendum)
CRITICAL VALUE ALERT  Critical value received:  POC CBG 60  Date of notification:  07/15/16  Time of notification:  [id number]  Critical value read back:Yes.    Nurse who received alert:  Parthenia Ames  MD notified (1st page):  Dr. Winfred Leeds  Time of first page:  1241  MD notified (2nd page):  Time of second page:  Responding MD:  Dr. Meridee Score  Time MD responded:  [phone]

## 2016-07-16 DIAGNOSIS — E162 Hypoglycemia, unspecified: Secondary | ICD-10-CM | POA: Diagnosis not present

## 2016-07-16 DIAGNOSIS — K7031 Alcoholic cirrhosis of liver with ascites: Secondary | ICD-10-CM | POA: Diagnosis not present

## 2016-07-16 LAB — COMPREHENSIVE METABOLIC PANEL
ALT: 71 U/L — ABNORMAL HIGH (ref 17–63)
ANION GAP: 5 (ref 5–15)
AST: 91 U/L — AB (ref 15–41)
Albumin: 2.8 g/dL — ABNORMAL LOW (ref 3.5–5.0)
Alkaline Phosphatase: 530 U/L — ABNORMAL HIGH (ref 38–126)
BUN: 12 mg/dL (ref 6–20)
CHLORIDE: 100 mmol/L — AB (ref 101–111)
CO2: 33 mmol/L — ABNORMAL HIGH (ref 22–32)
Calcium: 8.5 mg/dL — ABNORMAL LOW (ref 8.9–10.3)
Creatinine, Ser: 0.77 mg/dL (ref 0.61–1.24)
GFR calc Af Amer: >60 mL/min (ref >60)
Glucose, Bld: 153 mg/dL — ABNORMAL HIGH (ref 65–99)
POTASSIUM: 4.1 mmol/L (ref 3.5–5.1)
Sodium: 138 mmol/L (ref 135–145)
Total Bilirubin: 1.2 mg/dL (ref 0.3–1.2)
Total Protein: 6 g/dL — ABNORMAL LOW (ref 6.5–8.1)

## 2016-07-16 LAB — GLUCOSE, CAPILLARY
GLUCOSE-CAPILLARY: 136 mg/dL — AB (ref 65–99)
Glucose-Capillary: 152 mg/dL — ABNORMAL HIGH (ref 65–99)
Glucose-Capillary: 155 mg/dL — ABNORMAL HIGH (ref 65–99)
Glucose-Capillary: 161 mg/dL — ABNORMAL HIGH (ref 65–99)
Glucose-Capillary: 285 mg/dL — ABNORMAL HIGH (ref 65–99)

## 2016-07-16 LAB — CBC
HEMATOCRIT: 47 % (ref 39.0–52.0)
HEMOGLOBIN: 14.6 g/dL (ref 13.0–17.0)
MCH: 29.9 pg (ref 26.0–34.0)
MCHC: 31.1 g/dL (ref 30.0–36.0)
MCV: 96.3 fL (ref 78.0–100.0)
Platelets: 225 10*3/uL (ref 150–400)
RBC: 4.88 MIL/uL (ref 4.22–5.81)
RDW: 15.7 % — AB (ref 11.5–15.5)
WBC: 6.1 10*3/uL (ref 4.0–10.5)

## 2016-07-16 MED ORDER — HUMALOG MIX 75/25 KWIKPEN (75-25) 100 UNIT/ML ~~LOC~~ SUPN: 15.0000 [IU] | PEN_INJECTOR | Freq: Two times a day (BID) | SUBCUTANEOUS | 0 refills | Status: DC

## 2016-07-16 MED ORDER — URSODIOL 300 MG PO CAPS
300.0000 mg | ORAL_CAPSULE | Freq: Two times a day (BID) | ORAL | Status: DC
  Administered 2016-07-16: 300 mg via ORAL
  Filled 2016-07-16 (×7): qty 1

## 2016-07-16 MED ORDER — SPIRONOLACTONE 50 MG PO TABS: 50.0000 mg | ORAL_TABLET | Freq: Every day | ORAL | 1 refills | Status: DC

## 2016-07-16 NOTE — Progress Notes (Signed)
1131 Patient's ABD noted distended and tight. Patient c/o aching, tightness and some discomfort. Slight +1 edema noted to ABD region. PRN pain medication given as ordered. CBGs have trended up, IVF Dextrose 10% d/c as transcribed. MD notified.

## 2016-07-16 NOTE — Care Management Note (Signed)
Case Management Note  Patient Details  Name: [name] MRN: [id number] Date of Birth: Jan 20, 1961  Subjective/Objective:       Admitted with hypoglycemia. Pt is from home, lives with wife  and is ind with ADL's. Pt's wife at bedside . Pt has no assistive devices and no HH services prior to admission. Pt has PCP, transportation to appointments and no difficulty affording his medications. Pt plans to return home with self care               Action/Plan: No CM needs identified.    Expected Discharge Date:  07/17/16               Expected Discharge Plan:  Home/Self Care  In-House Referral:  NA  Discharge planning Services  CM Consult  Post Acute Care Choice:  NA Choice offered to:  NA  DME Arranged:    DME Agency:     HH Arranged:    HH Agency:     Status of Service:  In process, will continue to follow  If discussed at Long Length of Stay Meetings, dates discussed:    Additional Comments:  Sonia Stickels, Chauncey Reading, RN 07/16/2016, 9:51 AM

## 2016-07-16 NOTE — Progress Notes (Signed)
Patient is resting at this time and on 4lpm [place] and BIPAP is on standby for patient to use if needed tonight. Will re-evaluate patient tomorrow for use of BIPAP.

## 2016-07-16 NOTE — Progress Notes (Signed)
1427 d/c instructions, paperwork and hard Rx given to patient and patient's wife. Telemetry box and wiring removed from patient and central telemetry Anderson [place]) notified and made aware. IV catheters removed from RIGHT hand and RIGHT AC, intact w/no s/s of infection noted at this time, patient tolerated well w/no c/o pain or discomfort noted. Wife at bedside to transport patient home. Patient transported to vehicle via w/c by staff.

## 2016-07-16 NOTE — Care Management Obs Status (Signed)
[place] NOTIFICATION   Patient Details  Name: KILEY OVERDORF MRN: [id number] Date of Birth: 1960-12-20   Medicare Observation Status Notification Given:  Yes    Dianelys Scinto, Chauncey Reading, RN 07/16/2016, 9:53 AM

## 2016-07-16 NOTE — Discharge Summary (Signed)
Physician Discharge Summary  [name] [id number] DOB: 07-Jan-1961 DOA: 07/15/2016  PCP: Petra Kuba, MD  Admit date: 07/15/2016 Discharge date: 07/16/2016  Admitted From: Home  Disposition: Home   Recommendations for Outpatient Follow-up:  1. Follow up with PCP in 1-2 weeks 2. Please obtain BMP/CBC in one week  Home Health: No  Equipment/Devices: None   Discharge Condition: Improved  CODE STATUS: Full  Diet recommendation: Heart healthy / Carb modified   Brief/Interim Summary: Patient was admitted by Dr. Tennis Must on October 26th, 2017 for hypoglycemia. Per his H&P "  [name] is a 55 y.o. male with medical history significant of etoh use, aortic stenosis, mitral regurgitation, atrial flutter/fibrillation, history of pacemaker placement in 1995/2005/2015, BPH/TURP, COPD, congenital deafness, type 2 diabetes, GERD, liver cirrhosis, ascites, hypertension, hyperlipidemia, sleep apnea, hypothyroidism who is brought to the ED via EMS due to abdominal pain. The patient was hypoglycemic in route to the hospital with a CBG of 45 mg/dL. "  HOSPITAL COURSE: Patient has hx of ETOH and has ascites, and his ascites was removed with LVP of 4L.  He felt better, and had no evidence of infection.  He lives with his wife, and his wife draw up his insulin, and she didn't think she made a mistake.  His BS was low, and he was given D10, and subsequently, it stabalized.  He was given reduced insulin, from 25 units to 15 units BID of the Mixtard insulin.  He is now stable for discharged.  He will follow up with his GI and his PCP.  He was started on Aldactone at 50mg  per day.  Thank you and Good Day.    Discharge Diagnoses:  Principal Problem:   Hypoglycemia Active Problems:   Type 2 diabetes mellitus (HCC)   GASTROESOPHAGEAL REFLUX DISEASE   Hypertension   Sleep apnea   Atrial flutter (HCC)   Abdominal pain   Ascites   Hypothyroidism    Discharge  Instructions  Discharge Instructions    Diet - low sodium heart healthy    Complete by:  As directed    Discharge instructions    Complete by:  As directed    I have cut down your insulin.  Continue with checking your blood glucose to avoid low BS.  Follow up with your PCP next week, along with Dr Felipe Drone.   Increase activity slowly    Complete by:  As directed        Medication List    TAKE these medications   albuterol (2.5 MG/3ML) 0.083% nebulizer solution Commonly known as:  PROVENTIL Take 2.5 mg by nebulization every 6 (six) hours as needed for wheezing or shortness of breath. What changed:  Another medication with the same name was removed. Continue taking this medication, and follow the directions you see here.   aspirin 325 MG EC tablet Take 325 mg by mouth daily.   CENTRUM tablet Take 1 tablet by mouth daily.   cetirizine 10 MG tablet Commonly known as:  ZYRTEC Take 10 mg by mouth daily.   Fish Oil 1000 MG Caps Take 1 capsule by mouth daily.   GLUCOSAMINE CHONDR 1500 COMPLX PO Take 1 tablet by mouth every morning.   HUMALOG MIX 75/25 KWIKPEN (75-25) 100 UNIT/ML Kwikpen Generic drug:  Insulin Lispro Prot & Lispro Inject 15 Units into the skin 2 (two) times daily. 15 units in the morning and 15 units in the evening What changed:  how much to take  how to  take this  when to take this  additional instructions   LEVOXYL 50 MCG tablet Generic drug:  levothyroxine Take 50 mcg by mouth daily.   losartan 25 MG tablet Commonly known as:  COZAAR Take 25 mg by mouth daily.   metoprolol succinate 25 MG 24 hr tablet Commonly known as:  TOPROL-XL Take 25 mg by mouth daily.   modafinil 200 MG tablet Commonly known as:  PROVIGIL Take 200 mg by mouth daily.   mometasone 50 MCG/ACT nasal spray Commonly known as:  NASONEX Place 2 sprays into the nose daily as needed (FOR CONGESTION/ALLERGIES).   nitroGLYCERIN 0.4 MG SL tablet Commonly known as:   NITROSTAT Place 1 tablet (0.4 mg total) under the tongue every 5 (five) minutes as needed for chest pain.   omeprazole 40 MG capsule Commonly known as:  PRILOSEC TAKE 1 CAPSULE BY MOUTH ONCE DAILY FOR REFLUX.   spironolactone 50 MG tablet Commonly known as:  ALDACTONE Take 1 tablet (50 mg total) by mouth daily.   ursodiol 500 MG tablet Commonly known as:  URSO FORTE Take 1 tablet (500 mg total) by mouth 2 (two) times daily.       Allergies  Allergen Reactions  . Ciprofloxacin     Itching and red rash up arm when infusing after 3rd cipro dose for uti on 10/27/13  . Eliquis [Apixaban] Other (See Comments)    bleeding  . Enalapril Other (See Comments)    Cough  . Metformin And Related     Severe diaarhea  . Penicillins Other (See Comments)    Reaction unspecified    Consultations: None.   Procedures/Studies: Dg Chest 1 View  Result Date: 07/15/2016 CLINICAL DATA:  Abdominal pain. Short of breath with low oxygen saturation. EXAM: CHEST 1 VIEW COMPARISON:  02/29/2016 FINDINGS: Pacer with leads at right atrium and right ventricle. No lead discontinuity. Apical lordotic positioning. Midline trachea. Cardiomegaly accentuated by AP portable technique. No pleural effusion or pneumothorax. No congestive failure. Clear lungs. Mildly low lung volumes. No free intraperitoneal air. IMPRESSION: Cardiomegaly and low lung volumes.  No acute findings. Electronically Signed   By: Abigail Miyamoto M.D.   On: 07/15/2016 13:40   Ct Abdomen Pelvis W Contrast  Result Date: 07/15/2016 CLINICAL DATA:  55 y/o M; decreasing appetite and increasing abdominal distention for the past week. History of cirrhosis. EXAM: CT ABDOMEN AND PELVIS WITH CONTRAST TECHNIQUE: Multidetector CT imaging of the abdomen and pelvis was performed using the standard protocol following bolus administration of intravenous contrast. CONTRAST:  185mL ISOVUE-300 IOPAMIDOL (ISOVUE-300) INJECTION 61% COMPARISON:  06/23/2016 abdominal  ultrasound. 02/29/2016 CT abdomen and pelvis. FINDINGS: Lower chest: Small right pleural effusion and bibasilar atelectasis. Mild cardiomegaly. Aortic valvular calcification. Hepatobiliary: Cirrhotic liver. No focal liver lesion identified. No intra or extrahepatic biliary ductal dilatation. Pancreas: Unremarkable. No pancreatic ductal dilatation or surrounding inflammatory changes. Spleen: Mild splenomegaly. Adrenals/Urinary Tract: No focal renal lesion, urinary stone, or obstructive uropathy. Multiple cortical scars of the right kidney. Normal bladder. Stomach/Bowel: Stomach is within normal limits. Appendix appears normal. No evidence of bowel wall thickening, distention, or inflammatory changes. Vascular/Lymphatic: Aortic atherosclerosis. No enlarged abdominal or pelvic lymph nodes. Reproductive: Prostate is unremarkable. Other: Large volume of peritoneal ascites has markedly increased in comparison with prior CT. Musculoskeletal: No acute or significant osseous findings. IMPRESSION: Larger volume of peritoneal ascites is markedly progressed from prior CT of abdomen and pelvis. Otherwise no acute abnormality as explanation for abdominal pain. Cirrhotic liver and mild splenomegaly. Electronically  Signed   By: Kristine Garbe M.D.   On: 07/15/2016 15:06   US Paracentesis  Result Date: 07/15/2016 INDICATION: Patient with known cirrhosis and a distended abdomen. Patient presents for paracentesis. Patient has never had paracentesis performed. EXAM: ULTRASOUND GUIDED PARACENTESIS MEDICATIONS: None. COMPLICATIONS: None immediate. PROCEDURE: Informed written consent was obtained from the patient after a discussion of the risks, benefits and alternatives to treatment. A timeout was performed prior to the initiation of the procedure. Initial ultrasound scanning demonstrates a large amount of ascites within the right lower abdominal quadrant. The right lower abdomen was prepped and draped in the usual sterile  fashion. 1% lidocaine with epinephrine was used for local anesthesia. Following this, a 19 gauge, 7-cm, Yueh catheter was introduced. An ultrasound image was saved for documentation purposes. The paracentesis was performed. The catheter was removed and a dressing was applied. The patient tolerated the procedure well without immediate post procedural complication. FINDINGS: A total of approximately 4000 mL of straw-colored fluid was removed. IMPRESSION: Successful ultrasound-guided paracentesis yielding 4 liters of peritoneal fluid. Electronically Signed   By: Lajean Manes M.D.   On: 07/15/2016 16:44   US Abdomen Limited Ruq  Result Date: 06/23/2016 CLINICAL DATA:  Cirrhosis, elevated alkaline phosphatase, hepatocellular carcinoma surveillance EXAM: US ABDOMEN LIMITED - RIGHT UPPER QUADRANT COMPARISON:  Abdominal ultrasound of March 11, 2016 FINDINGS: Gallbladder: The gallbladder is adequately distended. The wall is mildly thickened at 4 mm which is slightly more conspicuous than on the previous study. There is ascites present which may be responsible for this. There is no evidence of gallstones or sludge. There is no positive sonographic Murphy's sign. Common bile duct: Diameter: 4 mm Liver: Increased hepatic echotexture. The liver appears shrunken. The surface contour is irregular. There is no discrete mass or intrahepatic ductal dilation. IMPRESSION: Hepatic cirrhosis without evidence of masses or ductal dilation. Mild gallbladder wall thickening without evidence of stones or sludge or positive sonographic Murphy's sign. Moderate amount of ascites. Electronically Signed   By: David  Martinique M.D.   On: 06/23/2016 08:30   (Echo, Carotid, EGD, Colonoscopy, ERCP)    Subjective:   Discharge Exam: Vitals:   07/16/16 0500 07/16/16 1323  BP: 113/72 119/76  Pulse: 73 83  Resp: 20 20  Temp: 98.2 F (36.8 C) 97.5 F (36.4 C)   Vitals:   07/15/16 1906 07/15/16 2026 07/16/16 0500 07/16/16 1323  BP:   132/72 113/72 119/76  Pulse:  76 73 83  Resp:  (!) 25 20 20   Temp:  98 F (36.7 C) 98.2 F (36.8 C) 97.5 F (36.4 C)  TempSrc:   Oral Oral  SpO2: 95% 96% 97% 95%  Weight:      Height:        General: Pt is alert, awake, not in acute distress Cardiovascular: RRR, S1/S2 +, no rubs, no gallops Respiratory: CTA bilaterally, no wheezing, no rhonchi Abdominal: Soft, NT, ND, bowel sounds + Extremities: no edema, no cyanosis    The results of significant diagnostics from this hospitalization (including imaging, microbiology, ancillary and laboratory) are listed below for reference.     Microbiology:  Labs:  Recent Labs Lab 07/15/16 1256 07/16/16 0701  NA 140 138  K 3.4* 4.1  CL 102 100*  CO2 32 33*  GLUCOSE 51* 153*  BUN 16 12  CREATININE 0.84 0.77  CALCIUM 8.8* 8.5*  MG 2.1  --   PHOS 3.1  --     Recent Labs Lab 07/15/16 1256 07/16/16 0701  AST 121* 91*  ALT 82* 71*  ALKPHOS 534* 530*  BILITOT 1.3* 1.2  PROT 6.7 6.0*  ALBUMIN 3.0* 2.8*    Recent Labs Lab 07/15/16 1256  LIPASE 18   CBC:  Recent Labs Lab 07/15/16 1256 07/16/16 0701  WBC 11.4* 6.1  NEUTROABS 9.2*  --   HGB 14.5 14.6  HCT 46.7 47.0  MCV 94.9 96.3  PLT 352 225       Component Value Date/Time   COLORURINE YELLOW 07/15/2016 [place] 07/15/2016 1358   LABSPEC 1.020 07/15/2016 1358   PHURINE 5.5 07/15/2016 1358   GLUCOSEU NEGATIVE 07/15/2016 1358   HGBUR NEGATIVE 07/15/2016 1358   BILIRUBINUR SMALL (A) 07/15/2016 1358   KETONESUR TRACE (A) 07/15/2016 1358   PROTEINUR NEGATIVE 07/15/2016 1358   UROBILINOGEN 0.2 05/13/2015 1315   NITRITE NEGATIVE 07/15/2016 1358   LEUKOCYTESUR NEGATIVE 07/15/2016 1358    SIGNED:  Orvan Falconer, MD FACP [facility] 07/16/2016, 4:58 PM If 7PM-7AM, please contact night-coverage [email or web address] Password [email or web address]  By signing my name below, I, Collene Leyden, attest that this documentation has been prepared under the direction  and in the presence of Orvan Falconer, MD. Electronically signed: Collene Leyden, Scribe. 07/16/16 1:14 PM

## 2016-07-17 LAB — AMYLASE, PERITONEAL FLUID: Amylase, peritoneal fluid: <10 U/L

## 2016-07-21 ENCOUNTER — Other Ambulatory Visit: Payer: Self-pay | Admitting: Cardiology

## 2016-07-27 ENCOUNTER — Encounter: Payer: Self-pay | Admitting: Internal Medicine

## 2016-07-27 ENCOUNTER — Ambulatory Visit (INDEPENDENT_AMBULATORY_CARE_PROVIDER_SITE_OTHER): Payer: Medicare Other | Admitting: Internal Medicine

## 2016-07-27 VITALS — BP 123/83 | HR 71 | Temp 97.5°F | Ht 63.0 in | Wt 183.0 lb

## 2016-07-27 DIAGNOSIS — Z86010 Personal history of colonic polyps: Secondary | ICD-10-CM | POA: Diagnosis not present

## 2016-07-27 DIAGNOSIS — K219 Gastro-esophageal reflux disease without esophagitis: Secondary | ICD-10-CM | POA: Diagnosis not present

## 2016-07-27 DIAGNOSIS — R188 Other ascites: Secondary | ICD-10-CM | POA: Diagnosis not present

## 2016-07-27 DIAGNOSIS — K5904 Chronic idiopathic constipation: Secondary | ICD-10-CM

## 2016-07-27 DIAGNOSIS — K7469 Other cirrhosis of liver: Secondary | ICD-10-CM | POA: Diagnosis not present

## 2016-07-27 MED ORDER — LACTULOSE 10 GM/15ML PO SOLN: ORAL | 11 refills | Status: DC

## 2016-07-27 NOTE — Progress Notes (Signed)
Primary Care Physician:  Petra Kuba, MD Primary Gastroenterologist:  Dr. Gala Romney  Pre-Procedure History & Physical: HPI:  [name] is a 55 y.o. male here for follow-up of Nash/cirrhosis with recent decompensation-anasarca/tense ascites requiring 4 LVP at the hospital. Negative cytology. Do not see worse cell count was performed.  Intermittent constipation and confusion per wife. No lactulose. Started on Aldactone 50 mg recently.  No evidence of hepatoma on recent CT/ultrasound. No biliary dilation. Recent: Phosphatase up at 900 and subsequently in the 500 range. AMA negative. Serum GGT greater than 1000. Started on Urso recently empirically  No esophageal varices on EGD 1 year ago. History colonic adenoma removed one year ago; due for screening EGD in 1 year and surveillance colonoscopy 4 years from now.    Past Medical History:  Diagnosis Date  . Alcohol use   . Anemia   . Aortic stenosis    mild  . Atrial flutter ([place])   . Benign hypertrophy of prostate    with urinary retention; repaired hypospadia; transurethral resection of the prostate   . Cardiac conduction disorder    status post pacemaker implantation in 1995 generator replaced 2005  . Chest pain 2007, 2009   cardiac cath > normal coronary arterie; nl left ventricular function  . Congenital deafness   . COPD (chronic obstructive pulmonary disease) ([place])   . Diabetes mellitus    +Insulin  . Edema   . GERD (gastroesophageal reflux disease)    status post multiple dilatations for stricture  . 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 HTN (hypertension)   . Hyperlipidemia    statin discontinued due to abn LFTs  . Mitral regurgitation    insignificant  . Obesity   . Obesity 4/08   BMI= 40  . Pelvic mass    identified in 2009, stable 2010  . Sleep apnea    BiPAP and continuous oxygen  . Syncope   . Thyroid disease     Past Surgical  History:  Procedure Laterality Date  . A-V CARDIAC PACEMAKER INSERTION  1995  . COLONOSCOPY  2008  . COLONOSCOPY N/A 06/23/2015   [id number] diverticulosis  . ESOPHAGOGASTRODUODENOSCOPY N/A 06/23/2015   [id number] portal gastropathy  . ORCHIECTOMY  1990   bilateral; ?neoplasm  . PACEMAKER INSERTION  05/2004; 10/04/2013   MDT dual chamber pacemaker; gen change 10/04/2013 (MDT ADDRL1)  . PERMANENT PACEMAKER GENERATOR CHANGE N/A 10/04/2013   Procedure: PERMANENT PACEMAKER GENERATOR CHANGE;  Surgeon: Evans Lance, MD;  Location[facility] CATH LAB;  Service: Cardiovascular;  Laterality: N/A;  . REPAIR HYPOSPADIAS W/ URETHROPLASTY    . TONSILLECTOMY AND ADENOIDECTOMY    . TRANSURETHRAL RESECTION OF PROSTATE     for BPH    Prior to Admission medications   Medication Sig Start Date End Date Taking? Authorizing Provider  albuterol (PROVENTIL HFA;VENTOLIN HFA) 108 (90 Base) MCG/ACT inhaler Inhale 2 puffs into the lungs every 6 (six) hours as needed for wheezing or shortness of breath.   Yes Historical Provider, MD  albuterol (PROVENTIL) (2.5 MG/3ML) 0.083% nebulizer solution Take 2.5 mg by nebulization every 6 (six) hours as needed for wheezing or shortness of breath.   Yes Historical Provider, MD  aspirin 325 MG EC tablet Take 325 mg by mouth daily.   Yes Historical Provider, MD  cetirizine (ZYRTEC) 10 MG tablet Take 10 mg by mouth daily.   Yes Historical Provider, MD  gentamicin ointment (GARAMYCIN)  0.1 % Apply 1 application topically 3 (three) times daily. Inside nose   Yes Historical Provider, MD  Glucosamine-Chondroit-Vit C-Mn (GLUCOSAMINE CHONDR 1500 COMPLX PO) Take 1 tablet by mouth every morning.   Yes Historical Provider, MD  HUMALOG MIX 75/25 KWIKPEN (75-25) 100 UNIT/ML Kwikpen Inject 15 Units into the skin 2 (two) times daily. 15 units in the morning and 15 units in the evening Patient taking differently: Inject 10 Units into the skin 2 (two) times daily. 15 units in the morning and 15 units in  the evening 07/16/16  Yes Orvan Falconer, MD  levothyroxine (LEVOXYL) 50 MCG tablet Take 50 mcg by mouth daily.     Yes Historical Provider, MD  losartan (COZAAR) 25 MG tablet Take 25 mg by mouth daily.   Yes Historical Provider, MD  metoprolol succinate (TOPROL-XL) 25 MG 24 hr tablet Take 25 mg by mouth daily.   Yes Historical Provider, MD  modafinil (PROVIGIL) 200 MG tablet Take 200 mg by mouth daily.  01/13/15  Yes Historical Provider, MD  mometasone (NASONEX) 50 MCG/ACT nasal spray Place 2 sprays into the nose daily as needed (FOR CONGESTION/ALLERGIES).   Yes Historical Provider, MD  NON FORMULARY Bipap 16.0/e   Yes Historical Provider, MD  Omega-3 Fatty Acids (FISH OIL) 1000 MG CAPS Take 1 capsule by mouth daily.   Yes Historical Provider, MD  omeprazole (PRILOSEC) 40 MG capsule TAKE 1 CAPSULE BY MOUTH ONCE DAILY FOR REFLUX. 07/21/16  Yes Arnoldo Lenis, MD  spironolactone (ALDACTONE) 50 MG tablet Take 1 tablet (50 mg total) by mouth daily. 07/16/16  Yes Orvan Falconer, MD  ursodiol (URSO FORTE) 500 MG tablet Take 1 tablet (500 mg total) by mouth 2 (two) times daily. 06/14/16  Yes Carlis Stable, NP  Multiple Vitamins-Minerals (CENTRUM) tablet Take 1 tablet by mouth daily.      Historical Provider, MD  nitroGLYCERIN (NITROSTAT) 0.4 MG SL tablet Place 1 tablet (0.4 mg total) under the tongue every 5 (five) minutes as needed for chest pain. Patient not taking: Reported on 07/27/2016 06/26/15   Lendon Colonel, NP    Allergies as of 07/27/2016 - Review Complete 07/27/2016  Allergen Reaction Noted  . Ciprofloxacin  10/27/2013  . Eliquis [apixaban] Other (See Comments) 03/18/2014  . Enalapril Other (See Comments) 04/15/2011  . Metformin and related  07/30/2013  . Penicillins Other (See Comments)     Family History  Problem Relation Age of Onset  . COPD Mother   . Hypertension Mother   . Congestive Heart Failure Mother   . Pneumonia Father   . Heart disease Other   . Hypertension Other   . Colon  cancer Neg Hx     Social History   Social History  . Marital status: Married    Spouse name: N/A  . Number of children: N/A  . Years of education: N/A   Occupational History  . Not on file.   Social History Main Topics  . Smoking status: Never Smoker  . Smokeless tobacco: Never Used     Comment: tobacco use- no   . Alcohol use No  . Drug use: No  . Sexual activity: Yes   Other Topics Concern  . Not on file   Social History Narrative   Part time.     Review of Systems: See HPI, otherwise negative ROS  Physical Exam: BP 123/83   Pulse 71   Temp 97.5 F (36.4 C) (Oral)   Ht 5\' 3"  (1.6 m)  Wt 183 lb (83 kg)   BMI 32.42 kg/m  General:   Chronically ill appearing, pleasant and cooperative in NAD; he has a tremor with outstretched hands but no asterixis.  Wife accompanies him today.  Eyes:  Sclera clear, no icterus.   Conjunctiva pink..  Disconjugate gaze-chronic  Neck:  Supple; no masses or thyromegaly. No significant cervical adenopathy. Lungs:  Clear throughout to auscultation.   No wheezes, crackles, or rhonchi. No acute distress. Heart:  Regular rate and rhythm; 2/6 SEM.  Abdomen: Rotund. Positive bowel sounds. Soft and non-tender. Equivocal shifting dullness present. No obvious mass or organomegaly appreciated.  Pulses:  Normal pulses noted. Extremities: 1+ edema   Impression:  55 year old gentleman with numerous medical problems including Nash/cirrhosis recently got decompensated with ascites requiring LVP. Intermittent constipation and confusion-likely mild encephalopathy. Needs to be started on lactulose. GERD well controlled with omeprazole. Elevated alkaline phosphatase-on Urso empirically for the time being. Recent biliary imaging negative for biliary dilation. AMA negative.  Marked elevation in GGT implies biliary origin of alkaline phosphatase.  Query AMA-negative PBC.  No medications to implicate.  Had a lengthy  discussion with patient and wife about  candidacy for liver transplantation. Because of patient's multiple comorbidities, including his heart disease, I do not deem him a candidate for liver transplantation.  Recommendations:   Continue omeprazole daily  Continue spironolactone 50 mg daily for now; may need a change in dosing in the near future. Also may also need furosemide.  Continue Urso at same dose for now  Add lactulose 2 teaspoons twice daily to move  bowels regularly - would like to see him have about 3 semi-formed stools daily.  BMET blood work in 1 week  2 gram sodium diet  Hepatic profile in 1 week  Screening EGD in 1 year; surveillance colonoscopy in 4 years  Office visit in 1 month       Notice: This dictation was prepared with Dragon dictation along with smaller phrase technology. Any transcriptional errors that result from this process are unintentional and may not be corrected upon review.

## 2016-07-27 NOTE — Patient Instructions (Addendum)
Continue omeprazole daily  Continue spironolactone 50 mg daily for now  Continue Urso at same dose for now  Add lactulose 2 teaspoons twice daily to move your bowels regularly  BMET blood work in 1 week  2 gram sodium diet  Hepatic profile in 1 week  Office visit in 1 month

## 2016-08-05 ENCOUNTER — Telehealth: Payer: Self-pay

## 2016-08-05 NOTE — Telephone Encounter (Signed)
Pt had blood work done at PCP and results are in RMR box

## 2016-08-10 ENCOUNTER — Other Ambulatory Visit: Payer: Self-pay | Admitting: Nurse Practitioner

## 2016-08-24 ENCOUNTER — Ambulatory Visit: Payer: Medicare Other | Admitting: Internal Medicine

## 2016-09-06 ENCOUNTER — Telehealth: Payer: Self-pay | Admitting: Cardiology

## 2016-09-06 ENCOUNTER — Ambulatory Visit (INDEPENDENT_AMBULATORY_CARE_PROVIDER_SITE_OTHER): Payer: Medicare Other | Admitting: *Deleted

## 2016-09-06 DIAGNOSIS — I495 Sick sinus syndrome: Secondary | ICD-10-CM | POA: Diagnosis not present

## 2016-09-06 NOTE — Telephone Encounter (Signed)
LMOVM reminding pt to send remote transmission.   

## 2016-09-07 LAB — CUP PACEART REMOTE DEVICE CHECK
Battery Impedance: 135 Ohm
Battery Remaining Longevity: 144 mo
Implantable Lead Implant Date: 20050923
Implantable Lead Model: 4524
Implantable Lead Model: 5024
Lead Channel Impedance Value: 283 Ohm
Lead Channel Impedance Value: 390 Ohm
Lead Channel Setting Pacing Amplitude: 2 V
Lead Channel Setting Pacing Pulse Width: 0.4 ms
Lead Channel Setting Sensing Sensitivity: 2.8 mV
MDC IDC LEAD IMPLANT DT: 20050923
MDC IDC LEAD LOCATION: 753859
MDC IDC LEAD LOCATION: 753860
MDC IDC MSMT BATTERY VOLTAGE: 2.79 V
MDC IDC MSMT LEADCHNL RV PACING THRESHOLD AMPLITUDE: 0.5 V
MDC IDC MSMT LEADCHNL RV PACING THRESHOLD PULSEWIDTH: 0.4 ms
MDC IDC PG IMPLANT DT: 20150115
MDC IDC SESS DTM: 20171218181656
MDC IDC SET LEADCHNL RA PACING AMPLITUDE: 2.5 V
MDC IDC STAT BRADY AP VP PERCENT: 0 %
MDC IDC STAT BRADY AP VS PERCENT: 0 %
MDC IDC STAT BRADY AS VP PERCENT: 0 %
MDC IDC STAT BRADY AS VS PERCENT: 100 %

## 2016-09-07 NOTE — Progress Notes (Signed)
Remote pacemaker transmission.   

## 2016-09-08 ENCOUNTER — Encounter: Payer: Self-pay | Admitting: Cardiology

## 2016-09-17 ENCOUNTER — Other Ambulatory Visit: Payer: Self-pay | Admitting: Cardiology

## 2016-09-17 ENCOUNTER — Other Ambulatory Visit: Payer: Self-pay | Admitting: Adult Health

## 2016-09-21 ENCOUNTER — Ambulatory Visit (HOSPITAL_COMMUNITY)
Admission: RE | Admit: 2016-09-21 | Discharge: 2016-09-21 | Disposition: A | Discharge status: Home / Self Care | Payer: Medicare Other | Source: Ambulatory Visit | Attending: Internal Medicine | Admitting: Internal Medicine

## 2016-09-21 ENCOUNTER — Other Ambulatory Visit: Payer: Self-pay

## 2016-09-21 ENCOUNTER — Encounter: Payer: Self-pay | Admitting: Internal Medicine

## 2016-09-21 ENCOUNTER — Other Ambulatory Visit: Payer: Self-pay | Admitting: Cardiology

## 2016-09-21 ENCOUNTER — Encounter (HOSPITAL_COMMUNITY): Payer: Self-pay

## 2016-09-21 ENCOUNTER — Ambulatory Visit (INDEPENDENT_AMBULATORY_CARE_PROVIDER_SITE_OTHER): Payer: Medicare Other | Admitting: Internal Medicine

## 2016-09-21 VITALS — BP 142/92 | HR 69 | Temp 97.6°F | Ht 63.0 in | Wt 176.8 lb

## 2016-09-21 DIAGNOSIS — K219 Gastro-esophageal reflux disease without esophagitis: Secondary | ICD-10-CM | POA: Diagnosis not present

## 2016-09-21 DIAGNOSIS — R188 Other ascites: Secondary | ICD-10-CM | POA: Diagnosis present

## 2016-09-21 DIAGNOSIS — K7031 Alcoholic cirrhosis of liver with ascites: Secondary | ICD-10-CM | POA: Diagnosis not present

## 2016-09-21 DIAGNOSIS — K7460 Unspecified cirrhosis of liver: Secondary | ICD-10-CM

## 2016-09-21 DIAGNOSIS — Z86010 Personal history of colonic polyps: Secondary | ICD-10-CM

## 2016-09-21 NOTE — Patient Instructions (Addendum)
Abdominal ultrasound today - LVP if appropriate  Continue 2 gram sodium diet  Continue Urso daily  Continue Nexium daily  Chem-12 in 1 month  Continue lactulose daily for 3-4 soft BM's daily  Further recommendations to follow

## 2016-09-21 NOTE — Progress Notes (Signed)
Primary Care Physician:  Petra Kuba, MD Primary Gastroenterologist:  Dr. Gala Romney  Pre-Procedure History & Physical: HPI:  [name] is a 56 y.o. male here for follow-up of cirrhosis and GERD. History of alcohol exposure. Markedly elevated alkaline phosphatase over 900 more recently 596 with outside lab assay.  AST/ ALT  73 and 80, respectively. Total bilirubin normal.  AMA previously negative. Renal function normal. Started on Urso empirically for AMA-negative PBC. 4 L LVP done last year. Ran out of Aldactone over the holidays. Did liberalize his dietary intake. Increased consumption of salt containing foods including ham. Weight today 176.8 was 183 on 07/27/2016. GERD symptoms well controlled on omeprazole 20 mg daily.  Wife denies any episodes of encephalopathy. Tolerating lactulose. Having 3-4 semi-formed stools daily.  Past Medical History:  Diagnosis Date  . Alcohol use   . Anemia   . Aortic stenosis    mild  . Atrial flutter ([place])   . Benign hypertrophy of prostate    with urinary retention; repaired hypospadia; transurethral resection of the prostate   . Cardiac conduction disorder    status post pacemaker implantation in 1995 generator replaced 2005  . Chest pain 2007, 2009   cardiac cath > normal coronary arterie; nl left ventricular function  . Congenital deafness   . COPD (chronic obstructive pulmonary disease) ([place])   . Diabetes mellitus    +Insulin  . Edema   . GERD (gastroesophageal reflux disease)    status post multiple dilatations for stricture  . 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 HTN (hypertension)   . Hyperlipidemia    statin discontinued due to abn LFTs  . Mitral regurgitation    insignificant  . Obesity   . Obesity 4/08   BMI= 40  . Pelvic mass    identified in 2009, stable 2010  . Sleep apnea    BiPAP and continuous oxygen  . Syncope   . Thyroid disease     Past  Surgical History:  Procedure Laterality Date  . A-V CARDIAC PACEMAKER INSERTION  1995  . COLONOSCOPY  2008  . COLONOSCOPY N/A 06/23/2015   [id number] diverticulosis  . ESOPHAGOGASTRODUODENOSCOPY N/A 06/23/2015   [id number] portal gastropathy  . ORCHIECTOMY  1990   bilateral; ?neoplasm  . PACEMAKER INSERTION  05/2004; 10/04/2013   MDT dual chamber pacemaker; gen change 10/04/2013 (MDT ADDRL1)  . PERMANENT PACEMAKER GENERATOR CHANGE N/A 10/04/2013   Procedure: PERMANENT PACEMAKER GENERATOR CHANGE;  Surgeon: Evans Lance, MD;  Location: [facility] CATH LAB;  Service: Cardiovascular;  Laterality: N/A;  . REPAIR HYPOSPADIAS W/ URETHROPLASTY    . TONSILLECTOMY AND ADENOIDECTOMY    . TRANSURETHRAL RESECTION OF PROSTATE     for BPH    Prior to Admission medications   Medication Sig Start Date End Date Taking? Authorizing Provider  albuterol (PROVENTIL HFA;VENTOLIN HFA) 108 (90 Base) MCG/ACT inhaler Inhale 2 puffs into the lungs every 6 (six) hours as needed for wheezing or shortness of breath.   Yes Historical Provider, MD  albuterol (PROVENTIL) (2.5 MG/3ML) 0.083% nebulizer solution Take 2.5 mg by nebulization every 6 (six) hours as needed for wheezing or shortness of breath.   Yes Historical Provider, MD  aspirin 325 MG EC tablet Take 325 mg by mouth daily.   Yes Historical Provider, MD  cetirizine (ZYRTEC) 10 MG tablet Take 10 mg by mouth daily.   Yes Historical Provider, MD  gentamicin ointment (GARAMYCIN) 0.1 %  Apply 1 application topically at bedtime. Inside nose    Yes Historical Provider, MD  Glucosamine-Chondroit-Vit C-Mn (GLUCOSAMINE CHONDR 1500 COMPLX PO) Take 1 tablet by mouth every morning.   Yes Historical Provider, MD  HUMALOG MIX 75/25 KWIKPEN (75-25) 100 UNIT/ML Kwikpen Inject 15 Units into the skin 2 (two) times daily. 15 units in the morning and 15 units in the evening Patient taking differently: Inject 10 Units into the skin 2 (two) times daily. 15 units in the morning and 15 units in the  evening 07/16/16  Yes Orvan Falconer, MD  lactulose [facility]) 10 GM/15ML solution 10cc (2 teaspoons) bid 07/27/16  Yes Daneil Dolin, MD  levothyroxine (LEVOXYL) 50 MCG tablet Take 50 mcg by mouth daily.     Yes Historical Provider, MD  losartan (COZAAR) 25 MG tablet TAKE (1) TABLET BY MOUTH DAILY FOR HIGH BLOOD PRESSURE. 09/17/16  Yes Lendon Colonel, NP  metoprolol succinate (TOPROL-XL) 25 MG 24 hr tablet Take 25 mg by mouth daily.   Yes Historical Provider, MD  modafinil (PROVIGIL) 200 MG tablet Take 200 mg by mouth daily.  01/13/15  Yes Historical Provider, MD  mometasone (NASONEX) 50 MCG/ACT nasal spray Place 2 sprays into the nose daily as needed (FOR CONGESTION/ALLERGIES).   Yes Historical Provider, MD  Multiple Vitamins-Minerals (CENTRUM) tablet Take 1 tablet by mouth daily.     Yes Historical Provider, MD  nitroGLYCERIN (NITROSTAT) 0.4 MG SL tablet Place 1 tablet (0.4 mg total) under the tongue every 5 (five) minutes as needed for chest pain. 06/26/15  Yes Lendon Colonel, NP  NON FORMULARY Bipap 16.0/e   Yes Historical Provider, MD  Omega-3 Fatty Acids (FISH OIL) 1000 MG CAPS Take 1 capsule by mouth every other day.    Yes Historical Provider, MD  omeprazole (PRILOSEC) 40 MG capsule TAKE 1 CAPSULE BY MOUTH ONCE DAILY FOR REFLUX. 09/17/16  Yes Arnoldo Lenis, MD  ursodiol (ACTIGALL) 500 MG tablet TAKE 1 TABLET BY MOUTH TWICE DAILY 08/11/16  Yes Carlis Stable, NP  spironolactone (ALDACTONE) 50 MG tablet Take 1 tablet (50 mg total) by mouth daily. Patient not taking: Reported on 09/21/2016 07/16/16   Orvan Falconer, MD    Allergies as of 09/21/2016 - Review Complete 09/21/2016  Allergen Reaction Noted  . Ciprofloxacin  10/27/2013  . Eliquis [apixaban] Other (See Comments) 03/18/2014  . Enalapril Other (See Comments) 04/15/2011  . Metformin and related  07/30/2013  . Penicillins Other (See Comments)     Family History  Problem Relation Age of Onset  . COPD Mother   . Hypertension Mother     . Congestive Heart Failure Mother   . Pneumonia Father   . Heart disease Other   . Hypertension Other   . Colon cancer Neg Hx     Social History   Social History  . Marital status: Married    Spouse name: N/A  . Number of children: N/A  . Years of education: N/A   Occupational History  . Not on file.   Social History Main Topics  . Smoking status: Never Smoker  . Smokeless tobacco: Never Used     Comment: tobacco use- no   . Alcohol use No  . Drug use: No  . Sexual activity: Yes   Other Topics Concern  . Not on file   Social History Narrative   Part time.     Review of Systems: See HPI, otherwise negative ROS  Physical Exam: BP (!) 142/92  Pulse 69   Temp 97.6 F (36.4 C) (Oral)   Ht 5\' 3"  (1.6 m)   Wt 176 lb 12.8 oz (80.2 kg)   BMI 31.32 kg/m  General:   Alert, pleasant and cooperative in NAD. Accompanied by spouse. No asterixis. Nose:  No deformity, discharge,  or lesions. Mouth:  No deformity or lesions. Neck:  Supple; no masses or thyromegaly. No significant cervical adenopathy. Lungs:  Clear throughout to auscultation.   No wheezes, crackles, or rhonchi. No acute distress. Heart:  Regular rate and rhythm; no murmurs, clicks, rubs,  or gallops. Abdomen: Rotund. Somewhat tight. Shifting dullness to percussion. Soft and nontender..  Pulses:  Normal pulses noted. Extremities:  Without clubbing or edema. Venous stasis changes present.  Impression:   Pleasant 56 year old gentleman with cirrhosis likely multifactorial in etiology. Alkaline  phosphatases drop significantly with the addition of Urso empirically. Clinically, he appears to reaccumulated ascites.  Off of his diuretic therapy for couple of weeks. His weight, however, is down from last office visit. GERD well controlled. Tolerating lactulose fairly well. No evidence of encephalopathy.  Recommendations:  Send for ultrasound  - if tense ascites confirmed, LVP to be performed.  May change  diuretic regimen in the near future.  Screening EGD in 1 year. Surveillance colonoscopy in about 3 years from now.  Hepatic ultrasound every 6 months  Continue lactulose titrate to 3-4 semi-formed stools daily  Continue omeprazole 40 mg daily.  Chem 12 within 4 weeks.  Further recommendations to follow.   Notice: This dictation was prepared with Dragon dictation along with smaller phrase technology. Any transcriptional errors that result from this process are unintentional and may not be corrected upon review.

## 2016-09-21 NOTE — Progress Notes (Signed)
Paracentesis complete no signs of distress. 7L yellow colored ascites removed.

## 2016-09-22 ENCOUNTER — Other Ambulatory Visit: Payer: Self-pay | Admitting: Internal Medicine

## 2016-09-22 DIAGNOSIS — R52 Pain, unspecified: Secondary | ICD-10-CM

## 2016-09-30 ENCOUNTER — Encounter (HOSPITAL_COMMUNITY): Payer: Self-pay

## 2016-09-30 ENCOUNTER — Telehealth: Payer: Self-pay

## 2016-09-30 ENCOUNTER — Other Ambulatory Visit: Payer: Self-pay

## 2016-09-30 ENCOUNTER — Emergency Department (HOSPITAL_COMMUNITY)
Admission: EM | Admit: 2016-09-30 | Discharge: 2016-09-30 | Disposition: A | Discharge status: Home / Self Care | Payer: Medicare Other | Attending: Emergency Medicine | Admitting: Emergency Medicine

## 2016-09-30 DIAGNOSIS — Z79899 Other long term (current) drug therapy: Secondary | ICD-10-CM | POA: Diagnosis not present

## 2016-09-30 DIAGNOSIS — J449 Chronic obstructive pulmonary disease, unspecified: Secondary | ICD-10-CM | POA: Insufficient documentation

## 2016-09-30 DIAGNOSIS — Z7982 Long term (current) use of aspirin: Secondary | ICD-10-CM | POA: Diagnosis not present

## 2016-09-30 DIAGNOSIS — I10 Essential (primary) hypertension: Secondary | ICD-10-CM | POA: Insufficient documentation

## 2016-09-30 DIAGNOSIS — E119 Type 2 diabetes mellitus without complications: Secondary | ICD-10-CM | POA: Insufficient documentation

## 2016-09-30 DIAGNOSIS — R140 Abdominal distension (gaseous): Secondary | ICD-10-CM | POA: Insufficient documentation

## 2016-09-30 DIAGNOSIS — E039 Hypothyroidism, unspecified: Secondary | ICD-10-CM | POA: Insufficient documentation

## 2016-09-30 DIAGNOSIS — R109 Unspecified abdominal pain: Secondary | ICD-10-CM | POA: Diagnosis present

## 2016-09-30 DIAGNOSIS — Z950 Presence of cardiac pacemaker: Secondary | ICD-10-CM | POA: Diagnosis not present

## 2016-09-30 DIAGNOSIS — R1030 Lower abdominal pain, unspecified: Secondary | ICD-10-CM

## 2016-09-30 DIAGNOSIS — R188 Other ascites: Secondary | ICD-10-CM

## 2016-09-30 DIAGNOSIS — R0602 Shortness of breath: Secondary | ICD-10-CM | POA: Insufficient documentation

## 2016-09-30 NOTE — ED Provider Notes (Signed)
[facility] DEPT Provider Note   CSN: [id number] Arrival date & time: 09/30/16  [id number]  By signing my name below, I, Hilbert Odor, attest that this documentation has been prepared under the direction and in the presence of Milton Ferguson, MD. Electronically Signed: Hilbert Odor, Scribe. 09/30/16. 9:52 AM. History   Chief Complaint Chief Complaint  Patient presents with  . Ascites    HPI [name] is a 56 y.o. male.  Patient complains of mild discomfort in his abdomen. He also states his abdomen getting larger. He has history of cirrhosis and ascites in his abdomen 9 days ago he had 7 L removed. He has gained 10 pounds since then   The history is provided by the patient and a relative. No language interpreter was used.  Abdominal Pain   This is a new problem. The current episode started 12 to 24 hours ago. The problem occurs constantly. The problem has been gradually worsening. Pertinent negatives include nausea and vomiting.  Patient also reports associated SOB. Patient had fluid removed on 09/21/2016 due to ascites. Per family: Patient has gained around 2 lbs a day. Patient denies nausea or vomiting. Past Medical History:  Diagnosis Date  . Alcohol use   . Anemia   . Aortic stenosis    mild  . Atrial flutter ([place])   . Benign hypertrophy of prostate    with urinary retention; repaired hypospadia; transurethral resection of the prostate   . Cardiac conduction disorder    status post pacemaker implantation in 1995 generator replaced 2005  . Chest pain 2007, 2009   cardiac cath > normal coronary arterie; nl left ventricular function  . Congenital deafness   . COPD (chronic obstructive pulmonary disease) ([place])   . Diabetes mellitus    +Insulin  . Edema   . GERD (gastroesophageal reflux disease)    status post multiple dilatations for stricture  . 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 HTN  (hypertension)   . Hyperlipidemia    statin discontinued due to abn LFTs  . Mitral regurgitation    insignificant  . Obesity   . Obesity 4/08   BMI= 40  . Pelvic mass    identified in 2009, stable 2010  . Sleep apnea    BiPAP and continuous oxygen  . Syncope   . Thyroid disease     Patient Active Problem List   Diagnosis Date Noted  . Hypothyroidism 07/15/2016  . Elevated alkaline phosphatase level 06/14/2016  . Elevated LFTs 06/14/2016  . Ascites   . Diarrhea 02/29/2016  . Abnormal liver function 02/29/2016  . Atrial flutter ([place]) 02/29/2016  . Hypoglycemia 02/29/2016  . Abdominal pain 02/29/2016  . Jejunitis   . Adenomatous colon polyp 09/01/2015  . Portal hypertensive gastropathy   . Heme + stool 06/02/2015  . Leukocytosis, unspecified 10/26/2013  . Hematuria 10/26/2013  . UTI (lower urinary tract infection) 10/25/2013  . HTN (hypertension)   . Atrial fibrillation ([place]) 10/19/2013  . Chronic diastolic heart failure ([place]) 09/19/2013  . Chest pain   . Aortic stenosis   . Hypertension   . Hyperlipidemia   . Pelvic mass   . Hepatic cirrhosis ([place])   . Benign hypertrophy of prostate   . Sleep apnea   . GASTROESOPHAGEAL REFLUX DISEASE 05/19/2010  . Cardiac pacemaker in situ 03/18/2010  . Type 2 diabetes mellitus ([place]) 06/27/2008  . ANEMIA, MILD 06/27/2008  . DEAFNESS, CONGENITAL 06/27/2008  .  CONDUCTION DISORDER OF THE HEART 06/27/2008    Past Surgical History:  Procedure Laterality Date  . A-V CARDIAC PACEMAKER INSERTION  1995  . COLONOSCOPY  2008  . COLONOSCOPY N/A 06/23/2015   [id number] diverticulosis  . ESOPHAGOGASTRODUODENOSCOPY N/A 06/23/2015   [id number] portal gastropathy  . ORCHIECTOMY  1990   bilateral; ?neoplasm  . PACEMAKER INSERTION  05/2004; 10/04/2013   MDT dual chamber pacemaker; gen change 10/04/2013 (MDT ADDRL1)  . PERMANENT PACEMAKER GENERATOR CHANGE N/A 10/04/2013   Procedure: PERMANENT PACEMAKER GENERATOR CHANGE;  Surgeon: Evans Lance,  MD;  Location: [facility] CATH LAB;  Service: Cardiovascular;  Laterality: N/A;  . REPAIR HYPOSPADIAS W/ URETHROPLASTY    . TONSILLECTOMY AND ADENOIDECTOMY    . TRANSURETHRAL RESECTION OF PROSTATE     for BPH       Home Medications    Prior to Admission medications   Medication Sig Start Date End Date Taking? Authorizing Provider  aspirin 325 MG EC tablet Take 325 mg by mouth daily.   Yes Historical Provider, MD  cetirizine (ZYRTEC) 10 MG tablet Take 10 mg by mouth daily.   Yes Historical Provider, MD  gentamicin ointment (GARAMYCIN) 0.1 % Apply 1 application topically at bedtime. Inside nose    Yes Historical Provider, MD  Glucosamine-Chondroit-Vit C-Mn (GLUCOSAMINE CHONDR 1500 COMPLX PO) Take 1 tablet by mouth every morning.   Yes Historical Provider, MD  HUMALOG MIX 75/25 KWIKPEN (75-25) 100 UNIT/ML Kwikpen Inject 15 Units into the skin 2 (two) times daily. 15 units in the morning and 15 units in the evening Patient taking differently: Inject 10 Units into the skin 2 (two) times daily. 10 units in the morning and 10 units in the evening 07/16/16  Yes Orvan Falconer, MD  lactulose [facility]) 10 GM/15ML solution 10cc (2 teaspoons) bid 07/27/16  Yes Daneil Dolin, MD  levothyroxine (LEVOXYL) 50 MCG tablet Take 50 mcg by mouth daily.     Yes Historical Provider, MD  losartan (COZAAR) 25 MG tablet TAKE (1) TABLET BY MOUTH DAILY FOR HIGH BLOOD PRESSURE. 09/17/16  Yes Lendon Colonel, NP  metoprolol succinate (TOPROL-XL) 25 MG 24 hr tablet Take 25 mg by mouth daily.   Yes Historical Provider, MD  modafinil (PROVIGIL) 200 MG tablet Take 200 mg by mouth daily.  01/13/15  Yes Historical Provider, MD  mometasone (NASONEX) 50 MCG/ACT nasal spray Place 2 sprays into the nose daily as needed (FOR CONGESTION/ALLERGIES).   Yes Historical Provider, MD  Multiple Vitamins-Minerals (CENTRUM) tablet Take 1 tablet by mouth daily.     Yes Historical Provider, MD  nitroGLYCERIN (NITROSTAT) 0.4 MG SL tablet Place 1 tablet  (0.4 mg total) under the tongue every 5 (five) minutes as needed for chest pain. 06/26/15  Yes Lendon Colonel, NP  Omega-3 Fatty Acids (FISH OIL) 1000 MG CAPS Take 1 capsule by mouth every other day.    Yes Historical Provider, MD  omeprazole (PRILOSEC) 40 MG capsule TAKE 1 CAPSULE BY MOUTH ONCE DAILY FOR REFLUX. 09/17/16  Yes Arnoldo Lenis, MD  spironolactone (ALDACTONE) 50 MG tablet TAKE 1 TABLET BY MOUTH ONCE DAILY. 09/21/16  Yes Arnoldo Lenis, MD  ursodiol (ACTIGALL) 500 MG tablet TAKE 1 TABLET BY MOUTH TWICE DAILY 08/11/16  Yes Carlis Stable, NP  albuterol (PROVENTIL HFA;VENTOLIN HFA) 108 (90 Base) MCG/ACT inhaler Inhale 2 puffs into the lungs every 6 (six) hours as needed for wheezing or shortness of breath.    Historical Provider, MD  albuterol (PROVENTIL) (2.5 MG/3ML)  0.083% nebulizer solution Take 2.5 mg by nebulization every 6 (six) hours as needed for wheezing or shortness of breath.    Historical Provider, MD  NON FORMULARY Bipap 16.0/e    Historical Provider, MD    Family History Family History  Problem Relation Age of Onset  . COPD Mother   . Hypertension Mother   . Congestive Heart Failure Mother   . Pneumonia Father   . Heart disease Other   . Hypertension Other   . Colon cancer Neg Hx     Social History Social History  Substance Use Topics  . Smoking status: Never Smoker  . Smokeless tobacco: Never Used     Comment: tobacco use- no   . Alcohol use No     Allergies   Ciprofloxacin; Eliquis [apixaban]; Enalapril; Metformin and related; and Penicillins   Review of Systems Review of Systems  Respiratory: Positive for shortness of breath.   Cardiovascular: Negative for chest pain.  Gastrointestinal: Positive for abdominal distention and abdominal pain. Negative for nausea and vomiting.  All other systems reviewed and are negative.    Physical Exam Updated Vital Signs BP 130/92 (BP Location: Right Arm)   Pulse 104   Temp 97.4 F (36.3 C) (Oral)    Resp 20   Ht 5\' 3"  (1.6 m)   Wt 176 lb (79.8 kg)   SpO2 90%   BMI 31.18 kg/m   Physical Exam  Constitutional: He is oriented to person, place, and time. He appears well-developed.  HENT:  Head: Normocephalic.  Eyes: Conjunctivae and EOM are normal. No scleral icterus.  Neck: Neck supple. No thyromegaly present.  Cardiovascular: Normal rate and regular rhythm.  Exam reveals no gallop and no friction rub.   No murmur heard. Pulmonary/Chest: No stridor. He has no wheezes. He has no rales. He exhibits no tenderness.  Abdominal: He exhibits distension. There is tenderness. There is no rebound.  abdomen is moderately distended and mildly tender.  Musculoskeletal: Normal range of motion. He exhibits no edema.  Lymphadenopathy:    He has no cervical adenopathy.  Neurological: He is oriented to person, place, and time. He exhibits normal muscle tone. Coordination normal.  Skin: No rash noted. No erythema.  Psychiatric: He has a normal mood and affect. His behavior is normal.     ED Treatments / Results  DIAGNOSTIC STUDIES: Oxygen Saturation is 90% on RA, adequate by my interpretation.    COORDINATION OF CARE: 9:38 AM Discussed treatment plan with pt at bedside and pt agreed to plan.  Labs (all labs ordered are listed, but only abnormal results are displayed) Labs Reviewed - No data to display  EKG  EKG Interpretation None       Radiology No results found.  Procedures Procedures (including critical care time)  Medications Ordered in ED Medications - No data to display   Initial Impression / Assessment and Plan / ED Course  I have reviewed the triage vital signs and the nursing notes.  Pertinent labs & imaging results that were available during my care of the patient were reviewed by me and considered in my medical decision making (see chart for details).  Clinical Course     I spoke with Dr. Oneida Alar assistant about the patient having return of his ascites. The  office will call him and set up another time for a paracentesis. Patient nontoxic. And agrees with this plan  Final Clinical Impressions(s) / ED Diagnoses   Final diagnoses:  None    New  Prescriptions New Prescriptions   No medications on file   The chart was scribed for me under my direct supervision.  I personally performed the history, physical, and medical decision making and all procedures in the evaluation of this patient.Milton Ferguson, MD 09/30/16 [phone]

## 2016-09-30 NOTE — Telephone Encounter (Signed)
Orders in an he is set up for PARA on 10/01/16 @ 12:45 pm

## 2016-09-30 NOTE — ED Notes (Signed)
Pt is on home oxygen at 3 liters.  Room air sats 90%.  Placed on 3 liters oxygen via [place].

## 2016-09-30 NOTE — ED Triage Notes (Signed)
Pt reports has cirrhosis and had fluid drained from abd Tuesday.  Pt c/o fluid retention and abd pain.  Denies any SOB.

## 2016-09-30 NOTE — Discharge Instructions (Signed)
You should hear from Dr. Roseanne Kaufman office about a time to come to the radiology department to have the fluid removed from your belly. If you do not hear from them by 4 PM today then you should call their office and ask if they have set this up yet

## 2016-09-30 NOTE — Telephone Encounter (Signed)
Ok to schedule U/S paracentesis. Give 25 gm albumin after 4 L and then 12.5 after each additional 2 L. No labs needed

## 2016-09-30 NOTE — Telephone Encounter (Signed)
Pt was seen in the ER today and was told that we would get him scheduled for a PARA. Please advise

## 2016-10-01 ENCOUNTER — Ambulatory Visit (HOSPITAL_COMMUNITY)
Admission: RE | Admit: 2016-10-01 | Discharge: 2016-10-01 | Disposition: A | Discharge status: Home / Self Care | Payer: Medicare Other | Source: Ambulatory Visit | Attending: Nurse Practitioner | Admitting: Nurse Practitioner

## 2016-10-01 ENCOUNTER — Encounter (HOSPITAL_COMMUNITY): Payer: Self-pay

## 2016-10-01 DIAGNOSIS — R42 Dizziness and giddiness: Secondary | ICD-10-CM | POA: Insufficient documentation

## 2016-10-01 DIAGNOSIS — R188 Other ascites: Secondary | ICD-10-CM | POA: Insufficient documentation

## 2016-10-01 MED ORDER — ALBUMIN HUMAN 25 % IV SOLN
50.0000 g | Freq: Once | INTRAVENOUS | Status: AC
  Administered 2016-10-01: 50 g via INTRAVENOUS

## 2016-10-01 MED ORDER — ALBUMIN HUMAN 25 % IV SOLN
INTRAVENOUS | Status: AC
  Administered 2016-10-01: 50 g via INTRAVENOUS
  Filled 2016-10-01: qty 200

## 2016-10-01 NOTE — Progress Notes (Signed)
Paracentesis complete no signs of distress. 3L amber colored ascites removed,

## 2016-10-01 NOTE — Procedures (Signed)
Successful therapeutic paracentesis using [name].  Please see imaging report.  JWatts MD

## 2016-10-11 NOTE — Progress Notes (Signed)
ON RECALL  °

## 2016-10-18 ENCOUNTER — Other Ambulatory Visit: Payer: Self-pay | Admitting: Cardiology

## 2016-10-21 ENCOUNTER — Telehealth: Payer: Self-pay

## 2016-10-21 ENCOUNTER — Other Ambulatory Visit: Payer: Self-pay

## 2016-10-21 DIAGNOSIS — R188 Other ascites: Secondary | ICD-10-CM

## 2016-10-21 NOTE — Telephone Encounter (Signed)
Ok to schedule para at [facility] when they can get him in. No labs needed at this time unless he is complaining of fever.

## 2016-10-21 NOTE — Telephone Encounter (Signed)
Pt's wife called to say that he is complaning about his stomach hurt and wanted to know if he could have some fluid drawn off. Please advise

## 2016-10-21 NOTE — Telephone Encounter (Signed)
Pt is scheduled for Para tomorrow 2/2 at 2:00 pm  Pt's wife is aware

## 2016-10-22 ENCOUNTER — Ambulatory Visit (HOSPITAL_COMMUNITY)
Admission: RE | Admit: 2016-10-22 | Discharge: 2016-10-22 | Disposition: A | Discharge status: Home / Self Care | Payer: Medicare Other | Source: Ambulatory Visit | Attending: Nurse Practitioner | Admitting: Nurse Practitioner

## 2016-10-22 ENCOUNTER — Encounter (HOSPITAL_COMMUNITY): Payer: Self-pay

## 2016-10-22 DIAGNOSIS — R188 Other ascites: Secondary | ICD-10-CM | POA: Insufficient documentation

## 2016-10-22 NOTE — Procedures (Signed)
PreOperative Dx: Cirrhosis, ascites Postoperative Dx: Cirrhosis, ascites Procedure:   US guided paracentesis Radiologist:  Vraj Denardo Anesthesia:  10 ml of1% lidocaine Specimen:  7 L of yellow ascitic fluid EBL:   < 1 ml Complications: None  

## 2016-10-22 NOTE — Progress Notes (Signed)
Paracentesis complete no signs of distress. 7L yellow colored ascites removed.

## 2016-10-25 ENCOUNTER — Telehealth: Payer: Self-pay | Admitting: Internal Medicine

## 2016-10-25 NOTE — Telephone Encounter (Signed)
[phone] please call patient, wife called with questions about the fluid they draw off patient, his insurance was wanting to know something about the amount of fluid.

## 2016-10-25 NOTE — Telephone Encounter (Signed)
Let's actually really good question. I suspect with his continued poor insight into his situation, he continues to overdo it with her fluid intake. In addition, to limiting him to a 2 g sodium diet. I would limit his 24-hour fluid intake to 1800 mL.  Let's get him in for an extender follow-up appointment in the next 4-6 weeks

## 2016-10-25 NOTE — Telephone Encounter (Signed)
Spoke with the pts wife- someone from their insurance company called them and wanted them to call us and ask what the pts fluid intake should be.  Dr.Rourk- should the pt limit his fluid intake? And pts wife was asking when does he need to return for a follow up ov?

## 2016-10-25 NOTE — Telephone Encounter (Signed)
Pt wife is aware. Went over how many cups and how many ounces in a day he should have. She wrote it down for him.  I have scheduled him an appt on 11/22/16 with AB.

## 2016-10-27 ENCOUNTER — Ambulatory Visit (HOSPITAL_COMMUNITY)
Admission: RE | Admit: 2016-10-27 | Discharge: 2016-10-27 | Disposition: A | Discharge status: Home / Self Care | Payer: Medicare Other | Source: Ambulatory Visit | Attending: Nurse Practitioner | Admitting: Nurse Practitioner

## 2016-10-27 ENCOUNTER — Other Ambulatory Visit: Payer: Self-pay

## 2016-10-27 ENCOUNTER — Telehealth: Payer: Self-pay

## 2016-10-27 ENCOUNTER — Other Ambulatory Visit: Payer: Self-pay | Admitting: Nurse Practitioner

## 2016-10-27 DIAGNOSIS — R188 Other ascites: Secondary | ICD-10-CM | POA: Insufficient documentation

## 2016-10-27 DIAGNOSIS — R52 Pain, unspecified: Secondary | ICD-10-CM | POA: Diagnosis not present

## 2016-10-27 DIAGNOSIS — I8290 Acute embolism and thrombosis of unspecified vein: Secondary | ICD-10-CM

## 2016-10-27 DIAGNOSIS — R932 Abnormal findings on diagnostic imaging of liver and biliary tract: Secondary | ICD-10-CM | POA: Insufficient documentation

## 2016-10-27 NOTE — Telephone Encounter (Signed)
Spoke with [employer]. Another message was sent requesting a standing order to be setup. Will follow-up

## 2016-10-27 NOTE — Procedures (Signed)
PreOperative Dx: Cirrhosis, ascites Postoperative Dx: Cirrhosis, ascites Procedure:   US guided paracentesis Radiologist:  Thornton Papas Anesthesia:  10 ml of1% lidocaine Specimen:  4 L of yellow ascitic fluid EBL:   < 1 ml Complications: None   VS:   Pre   94/69 BP       85 HR   82%pO2    98.8 F temp Post  100/87 BP    80 HR    84% pO2

## 2016-10-27 NOTE — Telephone Encounter (Signed)
Per EG I have put in the standing order for the PARA and a RUQ [name] also to eval for thrombus

## 2016-10-27 NOTE — Telephone Encounter (Signed)
Pt's wife is calling because he is complaining of his stomach hurting and being tight. They are wanting to see about having another PARA but it has only been 5 days. Please advise

## 2016-10-28 NOTE — Telephone Encounter (Signed)
Late note: Patient is aware to go to [facility] for [name]

## 2016-10-28 NOTE — Telephone Encounter (Signed)
Noted  

## 2016-11-03 ENCOUNTER — Telehealth: Payer: Self-pay | Admitting: Internal Medicine

## 2016-11-03 NOTE — Telephone Encounter (Signed)
Pt's wife, Jeannene Patella, was returning a call to JL. Please call her back at [phone]

## 2016-11-04 ENCOUNTER — Telehealth: Payer: Self-pay | Admitting: Internal Medicine

## 2016-11-04 ENCOUNTER — Telehealth: Payer: Self-pay

## 2016-11-04 NOTE — Telephone Encounter (Signed)
Labs missing serum creatinine

## 2016-11-04 NOTE — Telephone Encounter (Signed)
Pt's wife has called back again this morning. I told her a note was sent to the nurse to return her call, but she is with another patient and she would call her back. She said to call her cell number because they will be out today. [phone]

## 2016-11-04 NOTE — Telephone Encounter (Signed)
Spoke with pts wife, see U/S result note.

## 2016-11-04 NOTE — Telephone Encounter (Signed)
CMP Results ( Abnormal)  Glucose                   194      ( 65-99) Bun                            39      ( 6-24) Bun/ CR ratio             36      ( 9-20) Chloride                     93       ( 96-101) Calcium                     8.2      ( 8.7-10.2) Protein, total              5.4      ( 6.0-8.5) Albumin                      3.1     ( 3.5-5.5) Alkaline phos            566      ( 39-117) AST                             83     ( 0-40) ALT                             84      ( 0-44) Hem A1c                    6.8      ( 4.8-5.6)

## 2016-11-04 NOTE — Telephone Encounter (Signed)
See result note.  

## 2016-11-08 ENCOUNTER — Telehealth: Payer: Self-pay

## 2016-11-08 ENCOUNTER — Other Ambulatory Visit: Payer: Self-pay

## 2016-11-08 DIAGNOSIS — R188 Other ascites: Secondary | ICD-10-CM

## 2016-11-08 NOTE — Telephone Encounter (Signed)
Serum creatinine was normal at 1.08     ( 0.76-1.27).

## 2016-11-08 NOTE — Telephone Encounter (Signed)
Need creatinine

## 2016-11-08 NOTE — Telephone Encounter (Signed)
Pt's wife called and said pt needs paracentesis. Requested today after 3pm or tomorrow afternoon. Orders entered for US Paracentesis. Para scheduled for tomorrow at 3:15pm, arrive at 3:00pm. Called and informed pt's wife.

## 2016-11-09 ENCOUNTER — Ambulatory Visit (HOSPITAL_COMMUNITY)
Admission: RE | Admit: 2016-11-09 | Discharge: 2016-11-09 | Disposition: A | Discharge status: Home / Self Care | Payer: Medicare Other | Source: Ambulatory Visit | Attending: Nurse Practitioner | Admitting: Nurse Practitioner

## 2016-11-09 ENCOUNTER — Ambulatory Visit (HOSPITAL_COMMUNITY): Admission: RE | Admit: 2016-11-09 | Payer: Medicare Other | Source: Ambulatory Visit

## 2016-11-09 DIAGNOSIS — R1084 Generalized abdominal pain: Secondary | ICD-10-CM | POA: Diagnosis not present

## 2016-11-09 DIAGNOSIS — K9171 Accidental puncture and laceration of a digestive system organ or structure during a digestive system procedure: Secondary | ICD-10-CM | POA: Diagnosis not present

## 2016-11-09 DIAGNOSIS — R188 Other ascites: Secondary | ICD-10-CM

## 2016-11-09 LAB — CBC
HEMATOCRIT: 47.4 % (ref 39.0–52.0)
HEMOGLOBIN: 15.4 g/dL (ref 13.0–17.0)
MCH: 31.8 pg (ref 26.0–34.0)
MCHC: 32.5 g/dL (ref 30.0–36.0)
MCV: 97.9 fL (ref 78.0–100.0)
Platelets: 261 10*3/uL (ref 150–400)
RBC: 4.84 MIL/uL (ref 4.22–5.81)
RDW: 16.2 % — ABNORMAL HIGH (ref 11.5–15.5)
WBC: 7.6 10*3/uL (ref 4.0–10.5)

## 2016-11-09 LAB — PROTIME-INR
INR: 1.03
Prothrombin Time: 13.5 seconds (ref 11.4–15.2)

## 2016-11-09 NOTE — Telephone Encounter (Signed)
Dr. Gala Romney, the lab results are on your bookcase.  Please let me know if you need anything else.

## 2016-11-09 NOTE — Telephone Encounter (Signed)
Alkaline phosphatase, AST and ALT stable. BUN 39 creatinine 1.08. Hemoglobin A1c 6.8 Recommend a paracentesis as needed. Patient to continue on 2 g sodium diet. Regular exercise and maximization of good glycemic control recommended. He needs an office visit with extender in about 6 weeks from now

## 2016-11-09 NOTE — Progress Notes (Signed)
Paracentesis complete no signs of distress. 4L yellow colored ascites removed.  

## 2016-11-10 ENCOUNTER — Encounter (HOSPITAL_COMMUNITY): Payer: Self-pay | Admitting: Cardiology

## 2016-11-10 ENCOUNTER — Inpatient Hospital Stay (HOSPITAL_COMMUNITY)
Admission: EM | Admit: 2016-11-10 | Discharge: 2016-11-14 | DRG: 920 | Disposition: A | Discharge status: Home Health | Payer: Medicare Other | Attending: Family Medicine | Admitting: Family Medicine

## 2016-11-10 DIAGNOSIS — Z9981 Dependence on supplemental oxygen: Secondary | ICD-10-CM

## 2016-11-10 DIAGNOSIS — R1084 Generalized abdominal pain: Secondary | ICD-10-CM

## 2016-11-10 DIAGNOSIS — E785 Hyperlipidemia, unspecified: Secondary | ICD-10-CM | POA: Diagnosis present

## 2016-11-10 DIAGNOSIS — E119 Type 2 diabetes mellitus without complications: Secondary | ICD-10-CM | POA: Diagnosis present

## 2016-11-10 DIAGNOSIS — K9171 Accidental puncture and laceration of a digestive system organ or structure during a digestive system procedure: Principal | ICD-10-CM | POA: Diagnosis present

## 2016-11-10 DIAGNOSIS — D649 Anemia, unspecified: Secondary | ICD-10-CM | POA: Diagnosis present

## 2016-11-10 DIAGNOSIS — I110 Hypertensive heart disease with heart failure: Secondary | ICD-10-CM | POA: Diagnosis present

## 2016-11-10 DIAGNOSIS — Z888 Allergy status to other drugs, medicaments and biological substances status: Secondary | ICD-10-CM

## 2016-11-10 DIAGNOSIS — Z881 Allergy status to other antibiotic agents status: Secondary | ICD-10-CM

## 2016-11-10 DIAGNOSIS — Z950 Presence of cardiac pacemaker: Secondary | ICD-10-CM

## 2016-11-10 DIAGNOSIS — I5032 Chronic diastolic (congestive) heart failure: Secondary | ICD-10-CM | POA: Diagnosis present

## 2016-11-10 DIAGNOSIS — E872 Acidosis, unspecified: Secondary | ICD-10-CM | POA: Diagnosis present

## 2016-11-10 DIAGNOSIS — J449 Chronic obstructive pulmonary disease, unspecified: Secondary | ICD-10-CM | POA: Diagnosis present

## 2016-11-10 DIAGNOSIS — H905 Unspecified sensorineural hearing loss: Secondary | ICD-10-CM | POA: Diagnosis present

## 2016-11-10 DIAGNOSIS — Z825 Family history of asthma and other chronic lower respiratory diseases: Secondary | ICD-10-CM

## 2016-11-10 DIAGNOSIS — Z7982 Long term (current) use of aspirin: Secondary | ICD-10-CM

## 2016-11-10 DIAGNOSIS — I350 Nonrheumatic aortic (valve) stenosis: Secondary | ICD-10-CM

## 2016-11-10 DIAGNOSIS — E875 Hyperkalemia: Secondary | ICD-10-CM | POA: Diagnosis present

## 2016-11-10 DIAGNOSIS — N179 Acute kidney failure, unspecified: Secondary | ICD-10-CM | POA: Diagnosis present

## 2016-11-10 DIAGNOSIS — Z880 Allergy status to penicillin: Secondary | ICD-10-CM

## 2016-11-10 DIAGNOSIS — Z6827 Body mass index (BMI) 27.0-27.9, adult: Secondary | ICD-10-CM

## 2016-11-10 DIAGNOSIS — G4730 Sleep apnea, unspecified: Secondary | ICD-10-CM | POA: Diagnosis present

## 2016-11-10 DIAGNOSIS — K219 Gastro-esophageal reflux disease without esophagitis: Secondary | ICD-10-CM | POA: Diagnosis present

## 2016-11-10 DIAGNOSIS — R109 Unspecified abdominal pain: Secondary | ICD-10-CM

## 2016-11-10 DIAGNOSIS — I959 Hypotension, unspecified: Secondary | ICD-10-CM | POA: Diagnosis present

## 2016-11-10 DIAGNOSIS — Z8249 Family history of ischemic heart disease and other diseases of the circulatory system: Secondary | ICD-10-CM

## 2016-11-10 DIAGNOSIS — E039 Hypothyroidism, unspecified: Secondary | ICD-10-CM | POA: Diagnosis present

## 2016-11-10 DIAGNOSIS — K7460 Unspecified cirrhosis of liver: Secondary | ICD-10-CM | POA: Diagnosis present

## 2016-11-10 DIAGNOSIS — E669 Obesity, unspecified: Secondary | ICD-10-CM | POA: Diagnosis present

## 2016-11-10 DIAGNOSIS — Z794 Long term (current) use of insulin: Secondary | ICD-10-CM

## 2016-11-10 DIAGNOSIS — K7581 Nonalcoholic steatohepatitis (NASH): Secondary | ICD-10-CM | POA: Diagnosis present

## 2016-11-10 DIAGNOSIS — D62 Acute posthemorrhagic anemia: Secondary | ICD-10-CM | POA: Diagnosis present

## 2016-11-10 DIAGNOSIS — R188 Other ascites: Secondary | ICD-10-CM | POA: Diagnosis present

## 2016-11-10 DIAGNOSIS — I4891 Unspecified atrial fibrillation: Secondary | ICD-10-CM | POA: Diagnosis present

## 2016-11-10 DIAGNOSIS — Z974 Presence of external hearing-aid: Secondary | ICD-10-CM

## 2016-11-10 DIAGNOSIS — H903 Sensorineural hearing loss, bilateral: Secondary | ICD-10-CM | POA: Diagnosis present

## 2016-11-10 LAB — URINALYSIS, ROUTINE W REFLEX MICROSCOPIC
GLUCOSE, UA: 50 mg/dL — AB
KETONES UR: NEGATIVE mg/dL
Leukocytes, UA: NEGATIVE
NITRITE: NEGATIVE
PROTEIN: NEGATIVE mg/dL
Specific Gravity, Urine: 1.024 (ref 1.005–1.030)
pH: 5 (ref 5.0–8.0)

## 2016-11-10 LAB — COMPREHENSIVE METABOLIC PANEL
ALBUMIN: 2.7 g/dL — AB (ref 3.5–5.0)
ALK PHOS: 368 U/L — AB (ref 38–126)
ALT: 91 U/L — AB (ref 17–63)
AST: 106 U/L — AB (ref 15–41)
Anion gap: 10 (ref 5–15)
BILIRUBIN TOTAL: 1.1 mg/dL (ref 0.3–1.2)
BUN: 34 mg/dL — AB (ref 6–20)
CALCIUM: 8.6 mg/dL — AB (ref 8.9–10.3)
CO2: 26 mmol/L (ref 22–32)
Chloride: 99 mmol/L — ABNORMAL LOW (ref 101–111)
Creatinine, Ser: 1.68 mg/dL — ABNORMAL HIGH (ref 0.61–1.24)
GFR calc Af Amer: 51 mL/min — ABNORMAL LOW (ref >60)
GFR calc non Af Amer: 44 mL/min — ABNORMAL LOW (ref >60)
GLUCOSE: 162 mg/dL — AB (ref 65–99)
Potassium: 5.4 mmol/L — ABNORMAL HIGH (ref 3.5–5.1)
SODIUM: 135 mmol/L (ref 135–145)
TOTAL PROTEIN: 5.7 g/dL — AB (ref 6.5–8.1)

## 2016-11-10 LAB — CBC
HCT: 36.8 % — ABNORMAL LOW (ref 39.0–52.0)
Hemoglobin: 12.1 g/dL — ABNORMAL LOW (ref 13.0–17.0)
MCH: 31.6 pg (ref 26.0–34.0)
MCHC: 32.9 g/dL (ref 30.0–36.0)
MCV: 96.1 fL (ref 78.0–100.0)
Platelets: 445 10*3/uL — ABNORMAL HIGH (ref 150–400)
RBC: 3.83 MIL/uL — ABNORMAL LOW (ref 4.22–5.81)
RDW: 16.2 % — AB (ref 11.5–15.5)
WBC: 13.9 10*3/uL — ABNORMAL HIGH (ref 4.0–10.5)

## 2016-11-10 LAB — I-STAT CG4 LACTIC ACID, ED: Lactic Acid, Venous: 2.4 mmol/L (ref 0.5–1.9)

## 2016-11-10 LAB — LIPASE, BLOOD: Lipase: 13 U/L (ref 11–51)

## 2016-11-10 LAB — CBG MONITORING, ED: GLUCOSE-CAPILLARY: 143 mg/dL — AB (ref 65–99)

## 2016-11-10 MED ORDER — POVIDONE-IODINE 10 % EX SOLN
CUTANEOUS | Status: AC
  Filled 2016-11-10: qty 118

## 2016-11-10 NOTE — Telephone Encounter (Signed)
Called and spoke with pts wifeJeannene [name], she is aware of his results, she said he has been asleep all day and she cant get him out of the bed. She said he hasnt hardly ate anything all day and she is worried. I advised her to call 911 and get him to the ED asap. She stated she understood and will call EMS immediately and hung up.

## 2016-11-10 NOTE — ED Triage Notes (Signed)
Abdominal pain today.  Poor appetite today.  Sleeping all day long.  Had paracentesis as an outpatient yesterday.

## 2016-11-10 NOTE — ED Provider Notes (Signed)
[facility] DEPT Provider Note   CSN: [id number] Arrival date & time: 11/10/16  [id number]  By signing my name below, I, Jaquelyn Bitter., attest that this documentation has been prepared under the direction and in the presence of Daleen Bo, MD. Electronically signed: Jaquelyn Bitter., ED Scribe. 11/11/16. 12:22 AM.    History   Chief Complaint Chief Complaint  Patient presents with  . Abdominal Pain    HPI  [name] is a 56 y.o. male with hx of cirrhosis of liver, CHF, pacemaker, sleep apnea, and COPD who presents to the Emergency Department complaining of worsening moderate to severe abdominal pain with sudden onset x1 day. Per wife, pt had fluid drawn from the abdomen yesterday and since has been complaining of worsening abdominal pain that is TTP. She states that today, pt took 2 bites from a sandwich and reportedly was full. She states that his abdomen does not appear any different since the fluid was drawn.  Despite withdrawal of greater than 4 L of peritoneal fluid.  Of note, pt has been getting fluid drawn from the abdomen every x2 weeks. He uses oxygen at night to sleep and uses inhalers and nebulizers as needed.  The patient has felt cold but has not taken his temperature at home.  There has been no cough nausea or vomiting.  There are no other known modifying factors.   The history is provided by the spouse.    Past Medical History:  Diagnosis Date  . Alcohol use   . Anemia   . Aortic stenosis    mild  . Atrial flutter ([place])   . Benign hypertrophy of prostate    with urinary retention; repaired hypospadia; transurethral resection of the prostate   . Cardiac conduction disorder    status post pacemaker implantation in 1995 generator replaced 2005  . Chest pain 2007, 2009   cardiac cath > normal coronary arterie; nl left ventricular function  . Congenital deafness   . COPD (chronic obstructive pulmonary disease) ([place])   . Diabetes mellitus      +Insulin  . Edema   . GERD (gastroesophageal reflux disease)    status post multiple dilatations for stricture  . 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 HTN (hypertension)   . Hyperlipidemia    statin discontinued due to abn LFTs  . Mitral regurgitation    insignificant  . Obesity   . Obesity 4/08   BMI= 40  . Pelvic mass    identified in 2009, stable 2010  . Sleep apnea    BiPAP and continuous oxygen  . Syncope   . Thyroid disease     Patient Active Problem List   Diagnosis Date Noted  . Hypothyroidism 07/15/2016  . Elevated alkaline phosphatase level 06/14/2016  . Elevated LFTs 06/14/2016  . Ascites   . Diarrhea 02/29/2016  . Abnormal liver function 02/29/2016  . Atrial flutter ([place]) 02/29/2016  . Hypoglycemia 02/29/2016  . Abdominal pain 02/29/2016  . Jejunitis   . Adenomatous colon polyp 09/01/2015  . Portal hypertensive gastropathy   . Heme + stool 06/02/2015  . Leukocytosis, unspecified 10/26/2013  . Hematuria 10/26/2013  . UTI (lower urinary tract infection) 10/25/2013  . HTN (hypertension)   . Atrial fibrillation ([place]) 10/19/2013  . Chronic diastolic heart failure ([place]) 09/19/2013  . Chest pain   . Aortic stenosis   . Hypertension   . Hyperlipidemia   .  Pelvic mass   . Hepatic cirrhosis ([place])   . Benign hypertrophy of prostate   . Sleep apnea   . GASTROESOPHAGEAL REFLUX DISEASE 05/19/2010  . Cardiac pacemaker in situ 03/18/2010  . Type 2 diabetes mellitus ([place]) 06/27/2008  . ANEMIA, MILD 06/27/2008  . DEAFNESS, CONGENITAL 06/27/2008  . CONDUCTION DISORDER OF THE HEART 06/27/2008    Past Surgical History:  Procedure Laterality Date  . A-V CARDIAC PACEMAKER INSERTION  1995  . COLONOSCOPY  2008  . COLONOSCOPY N/A 06/23/2015   [id number] diverticulosis  . ESOPHAGOGASTRODUODENOSCOPY N/A 06/23/2015   [id number] portal gastropathy  . ORCHIECTOMY  1990   bilateral; ?neoplasm  . PACEMAKER  INSERTION  05/2004; 10/04/2013   MDT dual chamber pacemaker; gen change 10/04/2013 (MDT ADDRL1)  . PERMANENT PACEMAKER GENERATOR CHANGE N/A 10/04/2013   Procedure: PERMANENT PACEMAKER GENERATOR CHANGE;  Surgeon: Evans Lance, MD;  Location: [facility] CATH LAB;  Service: Cardiovascular;  Laterality: N/A;  . REPAIR HYPOSPADIAS W/ URETHROPLASTY    . TONSILLECTOMY AND ADENOIDECTOMY    . TRANSURETHRAL RESECTION OF PROSTATE     for BPH       Home Medications    Prior to Admission medications   Medication Sig Start Date End Date Taking? Authorizing Provider  albuterol (PROVENTIL HFA;VENTOLIN HFA) 108 (90 Base) MCG/ACT inhaler Inhale 2 puffs into the lungs every 6 (six) hours as needed for wheezing or shortness of breath.   Yes Historical Provider, MD  albuterol (PROVENTIL) (2.5 MG/3ML) 0.083% nebulizer solution Take 2.5 mg by nebulization every 6 (six) hours as needed for wheezing or shortness of breath.   Yes Historical Provider, MD  aspirin 325 MG EC tablet Take 325 mg by mouth daily.   Yes Historical Provider, MD  cetirizine (ZYRTEC) 10 MG tablet Take 10 mg by mouth daily.   Yes Historical Provider, MD  gentamicin ointment (GARAMYCIN) 0.1 % Apply 1 application topically at bedtime. Inside nose    Yes Historical Provider, MD  Glucosamine-Chondroit-Vit C-Mn (GLUCOSAMINE CHONDR 1500 COMPLX PO) Take 1 tablet by mouth every morning.   Yes Historical Provider, MD  HUMALOG MIX 75/25 KWIKPEN (75-25) 100 UNIT/ML Kwikpen Inject 15 Units into the skin 2 (two) times daily. 15 units in the morning and 15 units in the evening Patient taking differently: Inject 10 Units into the skin 2 (two) times daily. 10 units in the morning and 10 units in the evening 07/16/16  Yes Orvan Falconer, MD  lactulose [facility]) 10 GM/15ML solution 10cc (2 teaspoons) bid 07/27/16  Yes Daneil Dolin, MD  levothyroxine (LEVOXYL) 50 MCG tablet Take 50 mcg by mouth daily.     Yes Historical Provider, MD  losartan (COZAAR) 25 MG tablet TAKE (1)  TABLET BY MOUTH DAILY FOR HIGH BLOOD PRESSURE. 09/17/16  Yes Lendon Colonel, NP  metoprolol succinate (TOPROL-XL) 25 MG 24 hr tablet Take 25 mg by mouth daily.   Yes Historical Provider, MD  modafinil (PROVIGIL) 200 MG tablet Take 200 mg by mouth daily.  01/13/15  Yes Historical Provider, MD  mometasone (NASONEX) 50 MCG/ACT nasal spray Place 2 sprays into the nose daily as needed (FOR CONGESTION/ALLERGIES).   Yes Historical Provider, MD  Multiple Vitamins-Minerals (CENTRUM) tablet Take 1 tablet by mouth daily.     Yes Historical Provider, MD  nitroGLYCERIN (NITROSTAT) 0.4 MG SL tablet Place 1 tablet (0.4 mg total) under the tongue every 5 (five) minutes as needed for chest pain. 06/26/15  Yes Lendon Colonel, NP  Omega-3 Fatty Acids ([place])  1000 MG CAPS Take 1 capsule by mouth every other day.    Yes Historical Provider, MD  omeprazole (PRILOSEC) 40 MG capsule TAKE 1 CAPSULE BY MOUTH ONCE DAILY FOR REFLUX. 09/17/16  Yes Arnoldo Lenis, MD  spironolactone (ALDACTONE) 50 MG tablet TAKE 1 TABLET BY MOUTH ONCE DAILY. 10/19/16  Yes Arnoldo Lenis, MD  ursodiol (ACTIGALL) 500 MG tablet TAKE 1 TABLET BY MOUTH TWICE DAILY 08/11/16  Yes Carlis Stable, NP  NON FORMULARY Bipap 16.0/e    Historical Provider, MD    Family History Family History  Problem Relation Age of Onset  . COPD Mother   . Hypertension Mother   . Congestive Heart Failure Mother   . Pneumonia Father   . Heart disease Other   . Hypertension Other   . Colon cancer Neg Hx     Social History Social History  Substance Use Topics  . Smoking status: Never Smoker  . Smokeless tobacco: Never Used     Comment: tobacco use- no   . Alcohol use No     Allergies   Ciprofloxacin; Eliquis [apixaban]; Enalapril; Metformin and related; and Penicillins   Review of Systems Review of Systems  Constitutional: Positive for appetite change.  Gastrointestinal: Positive for abdominal distention and abdominal pain.  All other  systems reviewed and are negative.    Physical Exam Updated Vital Signs BP 108/77 (BP Location: Left Arm)   Pulse 102   Temp 98.5 F (36.9 C) (Rectal)   Resp 22   Ht 5\' 3"  (1.6 m)   Wt 152 lb 14.4 oz (69.4 kg)   SpO2 94%   BMI 27.09 kg/m   Physical Exam  Constitutional: He is oriented to person, place, and time. He appears well-developed.  Appears older than stated age.  Frail.  HENT:  Head: Normocephalic and atraumatic.  Right Ear: External ear normal.  Left Ear: External ear normal.  Eyes: Conjunctivae and EOM are normal. Pupils are equal, round, and reactive to light.  Conjunctiva are pink.  Neck: Normal range of motion and phonation normal. Neck supple.  Cardiovascular: Normal rate, regular rhythm and normal heart sounds.   Pulmonary/Chest: Effort normal and breath sounds normal. He exhibits no bony tenderness.  Abdominal: Soft. He exhibits distension. There is no tenderness (No focal tenderness). There is no rebound and no guarding.  Small puncture wound with small hematoma, right lower quadrant, site of recent paracentesis procedure.  Musculoskeletal: Normal range of motion.  Neurological: He is alert and oriented to person, place, and time. No sensory deficit. He exhibits normal muscle tone. Coordination normal.  Skin: Skin is warm, dry and intact.  Psychiatric: He has a normal mood and affect. His behavior is normal.  Nursing note and vitals reviewed.    ED Treatments / Results   DIAGNOSTIC STUDIES: Oxygen Saturation is 91% on RA, inadequate by my interpretation.   COORDINATION OF CARE: 12:22 AM-Discussed next steps with pt. Pt verbalized understanding and is agreeable with the plan. \  Labs (all labs ordered are listed, but only abnormal results are displayed) Labs Reviewed  COMPREHENSIVE METABOLIC PANEL - Abnormal; Notable for the following:       Result Value   Potassium 5.4 (*)    Chloride 99 (*)    Glucose, Bld 162 (*)    BUN 34 (*)     Creatinine, Ser 1.68 (*)    Calcium 8.6 (*)    Total Protein 5.7 (*)    Albumin 2.7 (*)  AST 106 (*)    ALT 91 (*)    Alkaline Phosphatase 368 (*)    GFR calc non Af Amer 44 (*)    GFR calc Af Amer 51 (*)    All other components within normal limits  CBC - Abnormal; Notable for the following:    WBC 13.9 (*)    RBC 3.83 (*)    Hemoglobin 12.1 (*)    HCT 36.8 (*)    RDW 16.2 (*)    Platelets 445 (*)    All other components within normal limits  URINALYSIS, ROUTINE W REFLEX MICROSCOPIC - Abnormal; Notable for the following:    Color, Urine AMBER (*)    APPearance CLOUDY (*)    Glucose, UA 50 (*)    Hgb urine dipstick LARGE (*)    Bilirubin Urine SMALL (*)    Bacteria, UA RARE (*)    All other components within normal limits  I-STAT CG4 LACTIC ACID, ED - Abnormal; Notable for the following:    Lactic Acid, Venous 2.40 (*)    All other components within normal limits  CBG MONITORING, ED - Abnormal; Notable for the following:    Glucose-Capillary 143 (*)    All other components within normal limits  CULTURE, BLOOD (ROUTINE X 2)  CULTURE, BLOOD (ROUTINE X 2)  BODY FLUID CULTURE  GRAM STAIN  LIPASE, BLOOD  SYNOVIAL CELL COUNT + DIFF, W/ CRYSTALS  GLUCOSE, SYNOVIAL FLUID    EKG  EKG Interpretation None       Radiology US Paracentesis  Result Date: 11/09/2016 INDICATION: 56 year old male with history of ascites.  Cirrhosis. EXAM: ULTRASOUND GUIDED  PARACENTESIS MEDICATIONS: None. COMPLICATIONS: None immediate. PROCEDURE: Informed written consent was obtained from the patient after a discussion of the risks, benefits and alternatives to treatment. A timeout was performed prior to the initiation of the procedure. Initial ultrasound scanning demonstrates a large amount of ascites within the right lower abdominal quadrant. The right lower abdomen was prepped and draped in the usual sterile fashion. 1% lidocaine with epinephrine was used for local anesthesia. Following this, a  19 gauge, 7-cm, Yueh catheter was introduced. An ultrasound image was saved for documentation purposes. The paracentesis was performed. The catheter was removed and a dressing was applied. The patient tolerated the procedure well without immediate post procedural complication. FINDINGS: A total of approximately 4 L of serous fluid was removed. IMPRESSION: Successful ultrasound-guided paracentesis yielding 4 liters of peritoneal fluid. Electronically Signed   By: Vinnie Langton M.D.   On: 11/09/2016 14:42    PT/INR, done yesterday, was normal at 1.03  Procedures Aspiration of blood/fluid Date/Time: 11/10/2016 11:37 PM Performed by: Daleen Bo Authorized by: Daleen Bo  Consent: Verbal consent obtained. Consent given by: spouse and patient Patient understanding: patient states understanding of the procedure being performed Patient consent: the patient's understanding of the procedure matches consent given Site marked: the operative site was not marked Imaging studies: imaging studies available Time out: Immediately prior to procedure a "time out" was called to verify the correct patient, procedure, equipment, support staff and site/side marked as required. Preparation: Patient was prepped and draped in the usual sterile fashion. Local anesthesia used: no  Anesthesia: Local anesthesia used: no  Sedation: Patient sedated: no Patient tolerance: Patient tolerated the procedure well with no immediate complications Comments: Aspiration of peritoneal fluid for diagnostic purposes.  Fluid pocket located in the right lower quadrant, using ultrasound guidance, adjacent to site from drainage procedure yesterday.  10 cc of  serosanguineous fluid withdrawn easily using a 21-gauge needle on a single stick.  Drainage controlled with pressure and a Band-Aid was placed on the puncture site.    (including critical care time)  Medications Ordered in ED Medications  povidone-iodine (BETADINE) 10 %  external solution (not administered)  0.9 %  sodium chloride infusion (not administered)     Initial Impression / Assessment and Plan / ED Course  I have reviewed the triage vital signs and the nursing notes.  Pertinent labs & imaging results that were available during my care of the patient were reviewed by me and considered in my medical decision making (see chart for details).  Clinical Course as of Nov 11 20  Thu Nov 11, 2016  0020 Lab called to report that the specimen from the peritoneal cavity, clotted, therefore usual fluid analysis could not be done.  The specimen will be sent for culture.  [EW]    Clinical Course User Index [EW] Daleen Bo, MD    Medications  povidone-iodine (BETADINE) 10 % external solution (not administered)  0.9 %  sodium chloride infusion (not administered)    Patient Vitals for the past 24 hrs:  BP Temp Temp src Pulse Resp SpO2 Height Weight  11/11/16 0019 108/77 - - 102 22 94 % - -  11/10/16 2300 - 98.5 F (36.9 C) Rectal - - - - -  11/10/16 2025 93/72 97.5 F (36.4 C) - 72 20 91 % - -  11/10/16 1853 - - - - - - 5\' 3"  (1.6 m) 152 lb 14.4 oz (69.4 kg)  11/10/16 1851 94/68 98.5 F (36.9 C) Oral 90 16 98 % - -    12:25 AM-Consult complete with Dr. Shanon Brow. Patient case explained and discussed. She agrees to admit patient for further evaluation and treatment. Call ended at 00:40   Final Clinical Impressions(s) / ED Diagnoses   Final diagnoses:  Generalized abdominal pain    Abdominal pain, post removal of peritoneal fluid, for therapeutic purposes.  Persistent ascites fluid palpated on exam today.  Fluid easily localized using ultrasound imaging.  Small amount of fluid withdrawn to evaluate for infection, revealed serosanguineous peritoneal fluid.  Blood work trending today indicates elevated creatinine and potassium, from baseline, remainder of cement is at baseline.  Hemoglobin today is 12.1 lower than 15.4, yesterday.  Patient is  hemodynamically stable.  He has chronic liver disease, but does not appear to have a coagulation disorder at this time.  Suspect traumatic therapeutic peritoneal drainage procedure.  Nursing Notes Reviewed/ Care Coordinated Applicable Imaging Reviewed Interpretation of Laboratory Data incorporated into ED treatment  Plan: Admit   New Prescriptions New Prescriptions   No medications on file   I personally performed the services described in this documentation, which was scribed in my presence. The recorded information has been reviewed and is accurate.    Daleen Bo, MD 11/11/16 0040

## 2016-11-11 ENCOUNTER — Inpatient Hospital Stay (HOSPITAL_COMMUNITY): Payer: Medicare Other

## 2016-11-11 ENCOUNTER — Encounter (HOSPITAL_COMMUNITY): Payer: Self-pay | Admitting: Radiology

## 2016-11-11 DIAGNOSIS — Z950 Presence of cardiac pacemaker: Secondary | ICD-10-CM

## 2016-11-11 DIAGNOSIS — N179 Acute kidney failure, unspecified: Secondary | ICD-10-CM | POA: Diagnosis not present

## 2016-11-11 DIAGNOSIS — K7581 Nonalcoholic steatohepatitis (NASH): Secondary | ICD-10-CM | POA: Diagnosis present

## 2016-11-11 DIAGNOSIS — I959 Hypotension, unspecified: Secondary | ICD-10-CM | POA: Diagnosis present

## 2016-11-11 DIAGNOSIS — K7031 Alcoholic cirrhosis of liver with ascites: Secondary | ICD-10-CM | POA: Diagnosis not present

## 2016-11-11 DIAGNOSIS — E119 Type 2 diabetes mellitus without complications: Secondary | ICD-10-CM

## 2016-11-11 DIAGNOSIS — K219 Gastro-esophageal reflux disease without esophagitis: Secondary | ICD-10-CM | POA: Diagnosis present

## 2016-11-11 DIAGNOSIS — K7469 Other cirrhosis of liver: Secondary | ICD-10-CM

## 2016-11-11 DIAGNOSIS — G4730 Sleep apnea, unspecified: Secondary | ICD-10-CM | POA: Diagnosis present

## 2016-11-11 DIAGNOSIS — D649 Anemia, unspecified: Secondary | ICD-10-CM | POA: Diagnosis present

## 2016-11-11 DIAGNOSIS — R1084 Generalized abdominal pain: Secondary | ICD-10-CM | POA: Diagnosis present

## 2016-11-11 DIAGNOSIS — I350 Nonrheumatic aortic (valve) stenosis: Secondary | ICD-10-CM | POA: Diagnosis not present

## 2016-11-11 DIAGNOSIS — Z9981 Dependence on supplemental oxygen: Secondary | ICD-10-CM | POA: Diagnosis not present

## 2016-11-11 DIAGNOSIS — R188 Other ascites: Secondary | ICD-10-CM | POA: Diagnosis present

## 2016-11-11 DIAGNOSIS — E669 Obesity, unspecified: Secondary | ICD-10-CM | POA: Diagnosis present

## 2016-11-11 DIAGNOSIS — J449 Chronic obstructive pulmonary disease, unspecified: Secondary | ICD-10-CM | POA: Diagnosis present

## 2016-11-11 DIAGNOSIS — I110 Hypertensive heart disease with heart failure: Secondary | ICD-10-CM | POA: Diagnosis present

## 2016-11-11 DIAGNOSIS — E872 Acidosis, unspecified: Secondary | ICD-10-CM | POA: Diagnosis present

## 2016-11-11 DIAGNOSIS — K9171 Accidental puncture and laceration of a digestive system organ or structure during a digestive system procedure: Secondary | ICD-10-CM | POA: Diagnosis present

## 2016-11-11 DIAGNOSIS — K7460 Unspecified cirrhosis of liver: Secondary | ICD-10-CM | POA: Diagnosis present

## 2016-11-11 DIAGNOSIS — D62 Acute posthemorrhagic anemia: Secondary | ICD-10-CM | POA: Diagnosis present

## 2016-11-11 DIAGNOSIS — I481 Persistent atrial fibrillation: Secondary | ICD-10-CM | POA: Diagnosis not present

## 2016-11-11 DIAGNOSIS — E875 Hyperkalemia: Secondary | ICD-10-CM | POA: Diagnosis present

## 2016-11-11 DIAGNOSIS — E039 Hypothyroidism, unspecified: Secondary | ICD-10-CM | POA: Diagnosis present

## 2016-11-11 DIAGNOSIS — H905 Unspecified sensorineural hearing loss: Secondary | ICD-10-CM | POA: Diagnosis not present

## 2016-11-11 DIAGNOSIS — I5032 Chronic diastolic (congestive) heart failure: Secondary | ICD-10-CM

## 2016-11-11 DIAGNOSIS — E785 Hyperlipidemia, unspecified: Secondary | ICD-10-CM | POA: Diagnosis present

## 2016-11-11 DIAGNOSIS — Z6827 Body mass index (BMI) 27.0-27.9, adult: Secondary | ICD-10-CM | POA: Diagnosis not present

## 2016-11-11 DIAGNOSIS — I4891 Unspecified atrial fibrillation: Secondary | ICD-10-CM | POA: Diagnosis present

## 2016-11-11 DIAGNOSIS — H903 Sensorineural hearing loss, bilateral: Secondary | ICD-10-CM | POA: Diagnosis present

## 2016-11-11 LAB — PROTIME-INR
INR: 1.16
Prothrombin Time: 14.9 seconds (ref 11.4–15.2)

## 2016-11-11 LAB — COMPREHENSIVE METABOLIC PANEL
ALT: 75 U/L — AB (ref 17–63)
AST: 81 U/L — ABNORMAL HIGH (ref 15–41)
Albumin: 2.2 g/dL — ABNORMAL LOW (ref 3.5–5.0)
Alkaline Phosphatase: 289 U/L — ABNORMAL HIGH (ref 38–126)
Anion gap: 8 (ref 5–15)
BILIRUBIN TOTAL: 0.8 mg/dL (ref 0.3–1.2)
BUN: 34 mg/dL — AB (ref 6–20)
CHLORIDE: 99 mmol/L — AB (ref 101–111)
CO2: 27 mmol/L (ref 22–32)
CREATININE: 1.36 mg/dL — AB (ref 0.61–1.24)
Calcium: 7.9 mg/dL — ABNORMAL LOW (ref 8.9–10.3)
GFR, EST NON AFRICAN AMERICAN: 57 mL/min — AB (ref >60)
Glucose, Bld: 217 mg/dL — ABNORMAL HIGH (ref 65–99)
Potassium: 4.3 mmol/L (ref 3.5–5.1)
Sodium: 134 mmol/L — ABNORMAL LOW (ref 135–145)
TOTAL PROTEIN: 4.7 g/dL — AB (ref 6.5–8.1)

## 2016-11-11 LAB — HEMOGLOBIN AND HEMATOCRIT, BLOOD
HCT: 30 % — ABNORMAL LOW (ref 39.0–52.0)
HEMOGLOBIN: 10 g/dL — AB (ref 13.0–17.0)

## 2016-11-11 LAB — GLUCOSE, CAPILLARY
GLUCOSE-CAPILLARY: 140 mg/dL — AB (ref 65–99)
GLUCOSE-CAPILLARY: 227 mg/dL — AB (ref 65–99)
Glucose-Capillary: 161 mg/dL — ABNORMAL HIGH (ref 65–99)

## 2016-11-11 LAB — CBC
HCT: 29.9 % — ABNORMAL LOW (ref 39.0–52.0)
Hemoglobin: 10 g/dL — ABNORMAL LOW (ref 13.0–17.0)
MCH: 32.1 pg (ref 26.0–34.0)
MCHC: 33.4 g/dL (ref 30.0–36.0)
MCV: 95.8 fL (ref 78.0–100.0)
PLATELETS: 297 10*3/uL (ref 150–400)
RBC: 3.12 MIL/uL — AB (ref 4.22–5.81)
RDW: 16.3 % — AB (ref 11.5–15.5)
WBC: 9.8 10*3/uL (ref 4.0–10.5)

## 2016-11-11 LAB — LACTIC ACID, PLASMA
LACTIC ACID, VENOUS: 1.5 mmol/L (ref 0.5–1.9)
Lactic Acid, Venous: 3.1 mmol/L (ref 0.5–1.9)

## 2016-11-11 LAB — AMMONIA: AMMONIA: 17 umol/L (ref 9–35)

## 2016-11-11 LAB — PROCALCITONIN: Procalcitonin: 0.88 ng/mL

## 2016-11-11 MED ORDER — METOPROLOL SUCCINATE ER 25 MG PO TB24
25.0000 mg | ORAL_TABLET | Freq: Every day | ORAL | Status: DC
  Administered 2016-11-11 – 2016-11-14 (×4): 25 mg via ORAL
  Filled 2016-11-11 (×4): qty 1

## 2016-11-11 MED ORDER — DEXTROSE 5 % IV SOLN
2.0000 g | INTRAVENOUS | Status: DC
  Administered 2016-11-11 – 2016-11-14 (×5): 2 g via INTRAVENOUS
  Filled 2016-11-11 (×4): qty 2

## 2016-11-11 MED ORDER — INSULIN ASPART 100 UNIT/ML ~~LOC~~ SOLN
0.0000 [IU] | Freq: Three times a day (TID) | SUBCUTANEOUS | Status: DC
  Administered 2016-11-11: 1 [IU] via SUBCUTANEOUS
  Administered 2016-11-11: 2 [IU] via SUBCUTANEOUS
  Administered 2016-11-11: 3 [IU] via SUBCUTANEOUS
  Administered 2016-11-12 (×2): 2 [IU] via SUBCUTANEOUS
  Administered 2016-11-13 (×3): 3 [IU] via SUBCUTANEOUS
  Administered 2016-11-14: 1 [IU] via SUBCUTANEOUS
  Administered 2016-11-14: 3 [IU] via SUBCUTANEOUS
  Administered 2016-11-14: 2 [IU] via SUBCUTANEOUS

## 2016-11-11 MED ORDER — ONDANSETRON HCL 4 MG PO TABS: 4.0000 mg | ORAL_TABLET | Freq: Four times a day (QID) | ORAL | Status: DC | PRN

## 2016-11-11 MED ORDER — SODIUM CHLORIDE 0.9 % IV BOLUS (SEPSIS)
500.0000 mL | Freq: Once | INTRAVENOUS | Status: AC
  Administered 2016-11-11: 500 mL via INTRAVENOUS

## 2016-11-11 MED ORDER — ADULT MULTIVITAMIN W/MINERALS CH
1.0000 | ORAL_TABLET | Freq: Every day | ORAL | Status: DC
  Administered 2016-11-11 – 2016-11-14 (×4): 1 via ORAL
  Filled 2016-11-11 (×7): qty 1

## 2016-11-11 MED ORDER — ALBUTEROL SULFATE (2.5 MG/3ML) 0.083% IN NEBU: 2.5000 mg | INHALATION_SOLUTION | Freq: Four times a day (QID) | RESPIRATORY_TRACT | Status: DC | PRN

## 2016-11-11 MED ORDER — IOPAMIDOL (ISOVUE-300) INJECTION 61%
100.0000 mL | Freq: Once | INTRAVENOUS | Status: AC | PRN
  Administered 2016-11-11: 100 mL via INTRAVENOUS

## 2016-11-11 MED ORDER — SPIRONOLACTONE 25 MG PO TABS
50.0000 mg | ORAL_TABLET | Freq: Every day | ORAL | Status: DC
  Administered 2016-11-11 – 2016-11-14 (×4): 50 mg via ORAL
  Filled 2016-11-11 (×4): qty 2

## 2016-11-11 MED ORDER — SODIUM CHLORIDE 0.9 % IV SOLN
INTRAVENOUS | Status: DC
  Administered 2016-11-11 – 2016-11-14 (×6): via INTRAVENOUS

## 2016-11-11 MED ORDER — DEXTROSE 5 % IV SOLN
INTRAVENOUS | Status: AC
  Filled 2016-11-11: qty 2

## 2016-11-11 MED ORDER — ASPIRIN EC 325 MG PO TBEC
325.0000 mg | DELAYED_RELEASE_TABLET | Freq: Every day | ORAL | Status: DC
  Administered 2016-11-11 – 2016-11-14 (×4): 325 mg via ORAL
  Filled 2016-11-11 (×4): qty 1

## 2016-11-11 MED ORDER — ALBUTEROL SULFATE HFA 108 (90 BASE) MCG/ACT IN AERS: 2.0000 | INHALATION_SPRAY | Freq: Four times a day (QID) | RESPIRATORY_TRACT | Status: DC | PRN

## 2016-11-11 MED ORDER — LEVOTHYROXINE SODIUM 50 MCG PO TABS
50.0000 ug | ORAL_TABLET | Freq: Every day | ORAL | Status: DC
  Administered 2016-11-11 – 2016-11-14 (×4): 50 ug via ORAL
  Filled 2016-11-11 (×4): qty 1

## 2016-11-11 MED ORDER — ONDANSETRON HCL 4 MG/2ML IJ SOLN
4.0000 mg | Freq: Four times a day (QID) | INTRAMUSCULAR | Status: DC | PRN
  Administered 2016-11-11: 4 mg via INTRAVENOUS
  Filled 2016-11-11: qty 2

## 2016-11-11 MED ORDER — SODIUM CHLORIDE 0.9 % IV SOLN
INTRAVENOUS | Status: DC
  Administered 2016-11-11: 01:00:00 via INTRAVENOUS

## 2016-11-11 MED ORDER — URSODIOL 500 MG PO TABS
500.0000 mg | ORAL_TABLET | Freq: Two times a day (BID) | ORAL | Status: DC
  Administered 2016-11-12 – 2016-11-14 (×5): 500 mg via ORAL

## 2016-11-11 MED ORDER — MODAFINIL 200 MG PO TABS
200.0000 mg | ORAL_TABLET | Freq: Every day | ORAL | Status: DC
  Administered 2016-11-11 – 2016-11-14 (×4): 200 mg via ORAL
  Filled 2016-11-11 (×4): qty 1

## 2016-11-11 NOTE — H&P (Signed)
History and Physical    [name] [id number] DOB: 1961/04/04 DOA: 11/10/2016  PCP: Petra Kuba, MD  Patient coming from:  home  Chief Complaint:  abdominial pain  HPI: [name] is a 56 y.o. male with medical history significant of NASH cirrhosis of the liver, AS, deafness uses aids, COPD, CRF on 3 liters day 5 liters qhs is getting paracentesis every couple weeks LVP last one yesterday.  He went to see his PCP on Tuesday and his sbp was in the 18s.  Wife and patient no med changes were made, he has continued to take his ARB and bblocker.  He has been more weak and getting dizzy.  Today his abdominal pain was worse than normal.  It is generalized.  He denies any fevers.  No urinary symptoms.  No sob.  No chest pain.  No melena, no brbpr and no vomiting or nausea.  Dr Eulis Foster in the ED did a tap in the ED and reports it was bloody, clotted before any labs could be done (only culture being done).  Pt found to be in AKI with drop in his hgb about 3 gms in a day.  No overt bleeding from GI system.  Referred for close monitoring of his hgb.    Review of Systems: As per HPI otherwise 10 point review of systems negative.   Past Medical History:  Diagnosis Date  . Alcohol use   . Anemia   . Aortic stenosis    mild  . Atrial flutter ([place])   . Benign hypertrophy of prostate    with urinary retention; repaired hypospadia; transurethral resection of the prostate   . Cardiac conduction disorder    status post pacemaker implantation in 1995 generator replaced 2005  . Chest pain 2007, 2009   cardiac cath > normal coronary arterie; nl left ventricular function  . Congenital deafness   . COPD (chronic obstructive pulmonary disease) ([place])   . Diabetes mellitus    +Insulin  . Edema   . GERD (gastroesophageal reflux disease)    status post multiple dilatations for stricture  . 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  HTN (hypertension)   . Hyperlipidemia    statin discontinued due to abn LFTs  . Mitral regurgitation    insignificant  . Obesity   . Obesity 4/08   BMI= 40  . Pelvic mass    identified in 2009, stable 2010  . Sleep apnea    BiPAP and continuous oxygen  . Syncope   . Thyroid disease     Past Surgical History:  Procedure Laterality Date  . A-V CARDIAC PACEMAKER INSERTION  1995  . COLONOSCOPY  2008  . COLONOSCOPY N/A 06/23/2015   [id number] diverticulosis  . ESOPHAGOGASTRODUODENOSCOPY N/A 06/23/2015   [id number] portal gastropathy  . ORCHIECTOMY  1990   bilateral; ?neoplasm  . PACEMAKER INSERTION  05/2004; 10/04/2013   MDT dual chamber pacemaker; gen change 10/04/2013 (MDT ADDRL1)  . PERMANENT PACEMAKER GENERATOR CHANGE N/A 10/04/2013   Procedure: PERMANENT PACEMAKER GENERATOR CHANGE;  Surgeon: Evans Lance, MD;  Location: [facility] CATH LAB;  Service: Cardiovascular;  Laterality: N/A;  . REPAIR HYPOSPADIAS W/ URETHROPLASTY    . TONSILLECTOMY AND ADENOIDECTOMY    . TRANSURETHRAL RESECTION OF PROSTATE     for BPH     reports that he has never smoked. He has never used smokeless tobacco. He reports that he does not drink alcohol  or use drugs.  Allergies  Allergen Reactions  . Ciprofloxacin     Itching and red rash up arm when infusing after 3rd cipro dose for uti on 10/27/13  . Eliquis [Apixaban] Other (See Comments)    bleeding  . Enalapril Other (See Comments)    Cough  . Metformin And Related     Severe diaarhea  . Penicillins Other (See Comments)    Reaction unspecified/childhood allergy Has patient had a PCN reaction causing immediate rash, facial/tongue/throat swelling, SOB or lightheadedness with hypotension: Yes Has patient had a PCN reaction causing severe rash involving mucus membranes or skin necrosis: No Has patient had a PCN reaction that required hospitalization No Has patient had a PCN reaction occurring within the last 10 years: No If all of the above answers are  "NO", then may proceed with Cephalosporin use.    Family History  Problem Relation Age of Onset  . COPD Mother   . Hypertension Mother   . Congestive Heart Failure Mother   . Pneumonia Father   . Heart disease Other   . Hypertension Other   . Colon cancer Neg Hx     Prior to Admission medications   Medication Sig Start Date End Date Taking? Authorizing Provider  albuterol (PROVENTIL HFA;VENTOLIN HFA) 108 (90 Base) MCG/ACT inhaler Inhale 2 puffs into the lungs every 6 (six) hours as needed for wheezing or shortness of breath.   Yes Historical Provider, MD  albuterol (PROVENTIL) (2.5 MG/3ML) 0.083% nebulizer solution Take 2.5 mg by nebulization every 6 (six) hours as needed for wheezing or shortness of breath.   Yes Historical Provider, MD  aspirin 325 MG EC tablet Take 325 mg by mouth daily.   Yes Historical Provider, MD  cetirizine (ZYRTEC) 10 MG tablet Take 10 mg by mouth daily.   Yes Historical Provider, MD  gentamicin ointment (GARAMYCIN) 0.1 % Apply 1 application topically at bedtime. Inside nose    Yes Historical Provider, MD  Glucosamine-Chondroit-Vit C-Mn (GLUCOSAMINE CHONDR 1500 COMPLX PO) Take 1 tablet by mouth every morning.   Yes Historical Provider, MD  HUMALOG MIX 75/25 KWIKPEN (75-25) 100 UNIT/ML Kwikpen Inject 15 Units into the skin 2 (two) times daily. 15 units in the morning and 15 units in the evening Patient taking differently: Inject 10 Units into the skin 2 (two) times daily. 10 units in the morning and 10 units in the evening 07/16/16  Yes Orvan Falconer, MD  lactulose [facility]) 10 GM/15ML solution 10cc (2 teaspoons) bid 07/27/16  Yes Daneil Dolin, MD  levothyroxine (LEVOXYL) 50 MCG tablet Take 50 mcg by mouth daily.     Yes Historical Provider, MD  losartan (COZAAR) 25 MG tablet TAKE (1) TABLET BY MOUTH DAILY FOR HIGH BLOOD PRESSURE. 09/17/16  Yes Lendon Colonel, NP  metoprolol succinate (TOPROL-XL) 25 MG 24 hr tablet Take 25 mg by mouth daily.   Yes Historical  Provider, MD  modafinil (PROVIGIL) 200 MG tablet Take 200 mg by mouth daily.  01/13/15  Yes Historical Provider, MD  mometasone (NASONEX) 50 MCG/ACT nasal spray Place 2 sprays into the nose daily as needed (FOR CONGESTION/ALLERGIES).   Yes Historical Provider, MD  Multiple Vitamins-Minerals (CENTRUM) tablet Take 1 tablet by mouth daily.     Yes Historical Provider, MD  nitroGLYCERIN (NITROSTAT) 0.4 MG SL tablet Place 1 tablet (0.4 mg total) under the tongue every 5 (five) minutes as needed for chest pain. 06/26/15  Yes Lendon Colonel, NP  Omega-3 Fatty Acids ([place])  1000 MG CAPS Take 1 capsule by mouth every other day.    Yes Historical Provider, MD  omeprazole (PRILOSEC) 40 MG capsule TAKE 1 CAPSULE BY MOUTH ONCE DAILY FOR REFLUX. 09/17/16  Yes Arnoldo Lenis, MD  spironolactone (ALDACTONE) 50 MG tablet TAKE 1 TABLET BY MOUTH ONCE DAILY. 10/19/16  Yes Arnoldo Lenis, MD  ursodiol (ACTIGALL) 500 MG tablet TAKE 1 TABLET BY MOUTH TWICE DAILY 08/11/16  Yes Carlis Stable, NP  NON FORMULARY Bipap 16.0/e    Historical Provider, MD    Physical Exam: Vitals:   11/10/16 2025 11/10/16 2300 11/11/16 0019 11/11/16 0022  BP: 93/72  108/77   Pulse: 72  102   Resp: 20  22   Temp: 97.5 F (36.4 C) 98.5 F (36.9 C)  98.3 F (36.8 C)  TempSrc:  Rectal  Oral  SpO2: 91%  94%   Weight:      Height:        Constitutional: NAD, calm, comfortable Vitals:   11/10/16 2025 11/10/16 2300 11/11/16 0019 11/11/16 0022  BP: 93/72  108/77   Pulse: 72  102   Resp: 20  22   Temp: 97.5 F (36.4 C) 98.5 F (36.9 C)  98.3 F (36.8 C)  TempSrc:  Rectal  Oral  SpO2: 91%  94%   Weight:      Height:       Eyes: PERRL, lids and conjunctivae normal ENMT: Mucous membranes are moist. Posterior pharynx clear of any exudate or lesions.Normal dentition.  Neck: normal, supple, no masses, no thyromegaly Respiratory: clear to auscultation bilaterally, no wheezing, no crackles. Normal respiratory effort. No  accessory muscle use.  Cardiovascular: Regular rate and rhythm, no murmurs / rubs / gallops. No extremity edema. 2+ pedal pulses. No carotid bruits.  Abdomen: no tenderness, no masses palpated. No hepatosplenomegaly. Bowel sounds positive.  Ascites present Musculoskeletal: no clubbing / cyanosis. No joint deformity upper and lower extremities. Good ROM, no contractures. Normal muscle tone.  Skin: no rashes, lesions, ulcers. No induration Neurologic: CN 2-12 grossly intact. Sensation intact, DTR normal. Strength 5/5 in all 4.  Psychiatric: Normal judgment and insight. Alert and oriented x 3. Normal mood.    Labs on Admission: I have personally reviewed following labs and imaging studies  CBC:  Recent Labs Lab 11/09/16 1306 11/10/16 2042  WBC 7.6 13.9*  HGB 15.4 12.1*  HCT 47.4 36.8*  MCV 97.9 96.1  PLT 261 [id number]*   Basic Metabolic Panel:  Recent Labs Lab 11/10/16 2042  NA 135  K 5.4*  CL 99*  CO2 26  GLUCOSE 162*  BUN 34*  CREATININE 1.68*  CALCIUM 8.6*   GFR: Estimated Creatinine Clearance: 43 mL/min (by C-G formula based on SCr of 1.68 mg/dL (H)). Liver Function Tests:  Recent Labs Lab 11/10/16 2042  AST 106*  ALT 91*  ALKPHOS 368*  BILITOT 1.1  PROT 5.7*  ALBUMIN 2.7*    Recent Labs Lab 11/10/16 2042  LIPASE 13   Coagulation Profile:  Recent Labs Lab 11/09/16 1306  INR 1.03    Recent Labs Lab 11/10/16 2346  GLUCAP 143*   Urine analysis:    Component Value Date/Time   COLORURINE AMBER (A) 11/10/2016 1856   APPEARANCEUR CLOUDY (A) 11/10/2016 1856   LABSPEC 1.024 11/10/2016 1856   PHURINE 5.0 11/10/2016 1856   GLUCOSEU 50 (A) 11/10/2016 1856   HGBUR LARGE (A) 11/10/2016 1856   BILIRUBINUR SMALL (A) 11/10/2016 1856   KETONESUR NEGATIVE 11/10/2016 1856  PROTEINUR NEGATIVE 11/10/2016 1856   UROBILINOGEN 0.2 05/13/2015 1315   NITRITE NEGATIVE 11/10/2016 1856   LEUKOCYTESUR NEGATIVE 11/10/2016 1856    Recent Results (from the past 240  hour(s))  Culture, blood (routine x 2)     Status: None (Preliminary result)   Collection Time: 11/10/16 10:18 PM  Result Value Ref Range Status   Specimen Description LEFT ANTECUBITAL  Final   Special Requests BOTTLES DRAWN AEROBIC AND ANAEROBIC 6CC  Final   Culture PENDING  Incomplete   Report Status PENDING  Incomplete  Culture, blood (routine x 2)     Status: None (Preliminary result)   Collection Time: 11/10/16 10:29 PM  Result Value Ref Range Status   Specimen Description LEFT ANTECUBITAL  Final   Special Requests BOTTLES DRAWN AEROBIC AND ANAEROBIC [facility]  Final   Culture PENDING  Incomplete   Report Status PENDING  Incomplete     Radiological Exams on Admission: US Paracentesis  Result Date: 11/09/2016 INDICATION: 56 year old male with history of ascites.  Cirrhosis. EXAM: ULTRASOUND GUIDED  PARACENTESIS MEDICATIONS: None. COMPLICATIONS: None immediate. PROCEDURE: Informed written consent was obtained from the patient after a discussion of the risks, benefits and alternatives to treatment. A timeout was performed prior to the initiation of the procedure. Initial ultrasound scanning demonstrates a large amount of ascites within the right lower abdominal quadrant. The right lower abdomen was prepped and draped in the usual sterile fashion. 1% lidocaine with epinephrine was used for local anesthesia. Following this, a 19 gauge, 7-cm, Yueh catheter was introduced. An ultrasound image was saved for documentation purposes. The paracentesis was performed. The catheter was removed and a dressing was applied. The patient tolerated the procedure well without immediate post procedural complication. FINDINGS: A total of approximately 4 L of serous fluid was removed. IMPRESSION: Successful ultrasound-guided paracentesis yielding 4 liters of peritoneal fluid. Electronically Signed   By: Vinnie Langton M.D.   On: 11/09/2016 14:42   Old chart reviewed Case discussed with EDP   Assessment/Plan 56 yo  male with LVP yesterday for his NASH cirrhosis of the liver with significant drop in Hgb and AKI  Principal Problem:   Abdominal pain- suspect due to traumatic tap yesterday, however cannot rule out underlying SBP.  Cover with iv rocephin until cx back.    Hypotension - again, pt does not look septic.  He has been taking his bp meds despite low bps as outpatient which explains his AKI.  Hold bp meds.  Give gentle fluid bolus.  Pt orthostatic.  hgb has dropped from 15 to 12, no overt GI bleeding.  Monitor h/h again in the am.   Check procalitonin level.   Active Problems:   Anemia- repeat cbc in am.  No overt bleeding.     AKI (acute kidney injury) ([place])- multifactorial, ivf and repeat in am   Lactic acidosis- serial q 3 hours.     Hyperkalemia- ivf, repeat in couple of hours   Type 2 diabetes mellitus ([place])- ssi   Deafness congenital- noted   Cardiac pacemaker in situ- noted   Aortic stenosis- noted   Hepatic cirrhosis ([place])- noted, requiring LVP frequently, will consult his GI doctor   Chronic diastolic heart failure ([place])- stable and compensated at this time, gentle ivf due to above issues   Atrial fibrillation ([place])- rate controlled at this time   Ascites- noted   Hypothyroidism- stable    DVT prophylaxis:  scds Code Status:   full Family Communication:  wife Disposition Plan:  Per day team Consults called:  GI requested in epic Admission status:   Admission   Cylie Dor A MD [facility]  If 7PM-7AM, please contact night-coverage [email or web address] Password [email or web address]  11/11/2016, 1:02 AM

## 2016-11-11 NOTE — ED Notes (Signed)
CRITICAL VALUE ALERT  Critical value received:  Lactic acid 3.1  Date of notification:  11/11/2016  Time of notification:  0151  Critical value read back:Yes.    Nurse who received alert:  Lura Em  MD notified (1st page):  Dr. Shanon Brow  Time of first page:  0156  MD notified (2nd page):  Time of second page:  Responding MD:    Time MD responded:

## 2016-11-11 NOTE — Care Management Note (Signed)
Case Management Note  Patient Details  Name: [name] MRN: [id number] Date of Birth: [birth year]  Subjective/Objective:                  Pt is from home, lives with his wife. Pt reports being ind with ADL's PTA. He ambulates with no assistive devices. He has no HH services PTA. He has neb machine and supplemental oxygen through [facility]. Pt has PCP, his wife drives him to appointments and he has no difficulty affording or managing medications. He plans to return home at DC.    Action/Plan: Will cont to follow for DC planning needs.   Expected Discharge Date:    11/13/2016              Expected Discharge Plan:  Home/Self Care  In-House Referral:  NA  Discharge planning Services  CM Consult  Post Acute Care Choice:  NA Choice offered to:  NA  Status of Service:  Completed, signed off   Sherald Barge, RN 11/11/2016, 11:50 AM

## 2016-11-11 NOTE — Progress Notes (Signed)
Dr notified thru Web link of pt's arrival and of pt's Bp. Pt stable showing no signs of distress at this time.

## 2016-11-11 NOTE — ED Notes (Signed)
Report given to [facility] on 300

## 2016-11-11 NOTE — Consult Note (Signed)
Referring Provider: [facility] Primary Care Physician:  Petra Kuba, MD Primary Gastroenterologist:  Dr. Gala Romney  Date of Admission: 11/11/16 Date of Consultation: 11/11/16  Reason for Consultation:  Abdominal pain, cirrhosis  HPI:  [name] is a 56 y.o. male well-known to our service with a past medical history of [name] cirrhosis, GERD, edema with accelerated need for ultrasound guided paracentesis. It appears his first paracentesis performed on 07/15/2016 and then did not have another one in 2 [phone] since then he has been requiring approximately a paracentesis every 2 weeks. Ultrasound was specifically asked to look for thrombus to explain acceleration of need but there was none. Query increased salt intake. His last paracentesis was on 11/09/2016 with removal of 4 L of serous fluid. Apparently, since his paracentesis, he has had moderate to severe abdominal pain. His blood pressure has been a little soft but he continue taking his blood pressure medications despite a touch of hypotension. He is also had some weakness and dizziness. No overt fevers. An abdominal fluid tap performed in the ER revealed serosanguineous fluid with clotted before routine labs could be done but it has been sent off for culture. Until then he has been covered prophylactically for spontaneous bacterial peritonitis. His lactic acid did come back high. Blood cultures have been drawn.  Labs show a 3 g drop in hemoglobin since the day before when he had his paracentesis. It is felt likely traumatic paracentesis versus spontaneous bacterial peritonitis, or possibly both. He may need titration of his diuretics to help control his fluid accumulation in order to prevent more frequent paracentesis, but this would have to be pending resolution of his acute kidney injury noted on labs with a creatinine of 1.68 with a baseline of normal. Today his creatinine is decreased to 1.36. Per review of the record no overt  signs of GI bleed. His platelet count is normal. Historically his INR has been normal, but it has not been checked this hospitalization.  His hgb has declined further this morning (15.5 2 days ago -> 12.1 on admission -> 10.0 this morning)  Today his history comes primarily from his wife. She states he had a paracentesis a couple days ago. The net day (yesterday) the patient stayed in bed most of the day. Had early 35. No noticible difference in abdominal girth from after the paracentesis the day prior. Abdominal pain worsened and presented to the ER late last night. Denies chest pain, worsening dyspnea, hematochezia, and melena. Some "blood in the front of his britches" query hematuria. Has had some constipation as of late, feels lactulose causes constipation. No other upper or lower GI symptoms.  Past Medical History:  Diagnosis Date  . Alcohol use   . Anemia   . Aortic stenosis    mild  . Atrial flutter ([place])   . Benign hypertrophy of prostate    with urinary retention; repaired hypospadia; transurethral resection of the prostate   . Cardiac conduction disorder    status post pacemaker implantation in 1995 generator replaced 2005  . Chest pain 2007, 2009   cardiac cath > normal coronary arterie; nl left ventricular function  . Congenital deafness   . COPD (chronic obstructive pulmonary disease) ([place])   . Diabetes mellitus    +Insulin  . Edema   . GERD (gastroesophageal reflux disease)    status post multiple dilatations for stricture  . 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  HTN (hypertension)   . Hyperlipidemia    statin discontinued due to abn LFTs  . Mitral regurgitation    insignificant  . Obesity   . Obesity 4/08   BMI= 40  . Pelvic mass    identified in 2009, stable 2010  . Sleep apnea    BiPAP and continuous oxygen  . Syncope   . Thyroid disease     Past Surgical History:  Procedure Laterality  Date  . A-V CARDIAC PACEMAKER INSERTION  1995  . COLONOSCOPY  2008  . COLONOSCOPY N/A 06/23/2015   [id number] diverticulosis  . ESOPHAGOGASTRODUODENOSCOPY N/A 06/23/2015   [id number] portal gastropathy  . ORCHIECTOMY  1990   bilateral; ?neoplasm  . PACEMAKER INSERTION  05/2004; 10/04/2013   MDT dual chamber pacemaker; gen change 10/04/2013 (MDT ADDRL1)  . PERMANENT PACEMAKER GENERATOR CHANGE N/A 10/04/2013   Procedure: PERMANENT PACEMAKER GENERATOR CHANGE;  Surgeon: Evans Lance, MD;  Location: [facility] CATH LAB;  Service: Cardiovascular;  Laterality: N/A;  . REPAIR HYPOSPADIAS W/ URETHROPLASTY    . TONSILLECTOMY AND ADENOIDECTOMY    . TRANSURETHRAL RESECTION OF PROSTATE     for BPH    Prior to Admission medications   Medication Sig Start Date End Date Taking? Authorizing Provider  albuterol (PROVENTIL HFA;VENTOLIN HFA) 108 (90 Base) MCG/ACT inhaler Inhale 2 puffs into the lungs every 6 (six) hours as needed for wheezing or shortness of breath.   Yes Historical Provider, MD  albuterol (PROVENTIL) (2.5 MG/3ML) 0.083% nebulizer solution Take 2.5 mg by nebulization every 6 (six) hours as needed for wheezing or shortness of breath.   Yes Historical Provider, MD  aspirin 325 MG EC tablet Take 325 mg by mouth daily.   Yes Historical Provider, MD  cetirizine (ZYRTEC) 10 MG tablet Take 10 mg by mouth daily.   Yes Historical Provider, MD  gentamicin ointment (GARAMYCIN) 0.1 % Apply 1 application topically at bedtime. Inside nose    Yes Historical Provider, MD  Glucosamine-Chondroit-Vit C-Mn (GLUCOSAMINE CHONDR 1500 COMPLX PO) Take 1 tablet by mouth every morning.   Yes Historical Provider, MD  HUMALOG MIX 75/25 KWIKPEN (75-25) 100 UNIT/ML Kwikpen Inject 15 Units into the skin 2 (two) times daily. 15 units in the morning and 15 units in the evening Patient taking differently: Inject 10 Units into the skin 2 (two) times daily. 10 units in the morning and 10 units in the evening 07/16/16  Yes Orvan Falconer, MD   lactulose [facility]) 10 GM/15ML solution 10cc (2 teaspoons) bid 07/27/16  Yes Daneil Dolin, MD  levothyroxine (LEVOXYL) 50 MCG tablet Take 50 mcg by mouth daily.     Yes Historical Provider, MD  losartan (COZAAR) 25 MG tablet TAKE (1) TABLET BY MOUTH DAILY FOR HIGH BLOOD PRESSURE. 09/17/16  Yes Lendon Colonel, NP  metoprolol succinate (TOPROL-XL) 25 MG 24 hr tablet Take 25 mg by mouth daily.   Yes Historical Provider, MD  modafinil (PROVIGIL) 200 MG tablet Take 200 mg by mouth daily.  01/13/15  Yes Historical Provider, MD  mometasone (NASONEX) 50 MCG/ACT nasal spray Place 2 sprays into the nose daily as needed (FOR CONGESTION/ALLERGIES).   Yes Historical Provider, MD  Multiple Vitamins-Minerals (CENTRUM) tablet Take 1 tablet by mouth daily.     Yes Historical Provider, MD  nitroGLYCERIN (NITROSTAT) 0.4 MG SL tablet Place 1 tablet (0.4 mg total) under the tongue every 5 (five) minutes as needed for chest pain. 06/26/15  Yes Lendon Colonel, NP  Omega-3 Fatty Acids (FISH OIL) 1000  MG CAPS Take 1 capsule by mouth every other day.    Yes Historical Provider, MD  omeprazole (PRILOSEC) 40 MG capsule TAKE 1 CAPSULE BY MOUTH ONCE DAILY FOR REFLUX. 09/17/16  Yes Arnoldo Lenis, MD  spironolactone (ALDACTONE) 50 MG tablet TAKE 1 TABLET BY MOUTH ONCE DAILY. 10/19/16  Yes Arnoldo Lenis, MD  ursodiol (ACTIGALL) 500 MG tablet TAKE 1 TABLET BY MOUTH TWICE DAILY 08/11/16  Yes Carlis Stable, NP  NON FORMULARY Bipap 16.0/e    Historical Provider, MD    Current Facility-Administered Medications  Medication Dose Route Frequency Provider Last Rate Last Dose  . 0.9 %  sodium chloride infusion   Intravenous Continuous Phillips Grout, MD 100 mL/hr at 11/11/16 1209    . albuterol (PROVENTIL) (2.5 MG/3ML) 0.083% nebulizer solution 2.5 mg  2.5 mg Nebulization Q6H PRN Phillips Grout, MD      . aspirin EC tablet 325 mg  325 mg Oral Daily Phillips Grout, MD   325 mg at 11/11/16 0854  . cefTRIAXone (ROCEPHIN) 2 g  in dextrose 5 % 50 mL IVPB  2 g Intravenous Q24H Phillips Grout, MD   Stopped at 11/11/16 [phone]  . insulin aspart (novoLOG) injection 0-9 Units  0-9 Units Subcutaneous TID WC Phillips Grout, MD   3 Units at 11/11/16 1210  . levothyroxine (SYNTHROID, LEVOTHROID) tablet 50 mcg  50 mcg Oral QAC breakfast Phillips Grout, MD   50 mcg at 11/11/16 0830  . metoprolol succinate (TOPROL-XL) 24 hr tablet 25 mg  25 mg Oral Daily Phillips Grout, MD   25 mg at 11/11/16 0854  . modafinil (PROVIGIL) tablet 200 mg  200 mg Oral Daily Phillips Grout, MD   200 mg at 11/11/16 0853  . multivitamin with minerals tablet 1 tablet  1 tablet Oral Daily Phillips Grout, MD   1 tablet at 11/11/16 [phone]  . ondansetron (ZOFRAN) tablet 4 mg  4 mg Oral Q6H PRN Phillips Grout, MD       Or  . ondansetron [facility]) injection 4 mg  4 mg Intravenous Q6H PRN Phillips Grout, MD      . spironolactone (ALDACTONE) tablet 50 mg  50 mg Oral Daily Phillips Grout, MD   50 mg at 11/11/16 0853  . ursodiol (ACTIGALL) tablet 500 mg  500 mg Oral BID Phillips Grout, MD        Allergies as of 11/10/2016 - Review Complete 11/10/2016  Allergen Reaction Noted  . Ciprofloxacin  10/27/2013  . Eliquis [apixaban] Other (See Comments) 03/18/2014  . Enalapril Other (See Comments) 04/15/2011  . Metformin and related  07/30/2013  . Penicillins Other (See Comments)     Family History  Problem Relation Age of Onset  . COPD Mother   . Hypertension Mother   . Congestive Heart Failure Mother   . Pneumonia Father   . Heart disease Other   . Hypertension Other   . Colon cancer Neg Hx     Social History   Social History  . Marital status: Married    Spouse name: N/A  . Number of children: N/A  . Years of education: N/A   Occupational History  . Not on file.   Social History Main Topics  . Smoking status: Never Smoker  . Smokeless tobacco: Never Used     Comment: tobacco use- no   . Alcohol use No  . Drug use: No  . Sexual activity: Yes  Other Topics Concern  . Not on file   Social History Narrative   Part time.     Review of Systems: General: Negative for anorexia, weight loss, fever, chills. Eyes: Negative for vision changes.  ENT: Negative for difficulty swallowing. CV: Negative for chest pain, angina, palpitations, peripheral edema.  Respiratory: Negative for dyspnea at rest, cough, sputum, wheezing.  GI: See history of present illness. Derm: Negative for rash or itching.  Neuro: Negative for memory loss, confusion. Wife states he hasn't been acting "like Tavis" for the past couple days Endo: Negative for unusual weight change.  Heme: Negative for bruising or bleeding. Allergy: Negative for rash or hives.  Physical Exam: Vital signs in last 24 hours: Temp:  [97.5 F (36.4 C)-98.5 F (36.9 C)] 98.3 F (36.8 C) (02/22 0436) Pulse Rate:  [72-106] 106 (02/22 0436) Resp:  [16-27] 18 (02/22 0313) BP: (91-109)/(68-82) 103/69 (02/22 0436) SpO2:  [91 %-98 %] 95 % (02/22 0628) Weight:  [152 lb 14.4 oz (69.4 kg)-158 lb 1.1 oz (71.7 kg)] 158 lb 1.1 oz (71.7 kg) (02/22 0313)   General:   Alert,  Well-developed, well-nourished, pleasant and cooperative in NAD Head:  Normocephalic and atraumatic. Eyes:  Sclera clear, no icterus. Conjunctiva pink. Ears:  Normal auditory acuity. Neck:  Supple; no masses or thyromegaly. Lungs:  Clear throughout to auscultation. No wheezes, crackles, or rhonchi. No acute distress. Wearing nasal cannula. Heart:  Regular rate and rhythm Abdomen:  Abdomen distended, generalized moderate TTP, tympany on percussion. Normal bowel sounds, without guarding, and without rebound.   Rectal:  Deferred.   Msk:  Symmetrical without gross deformities. Extremities:  Without clubbing or edema. Neurologic:  Alert and  oriented x4;  grossly normal neurologically. Skin:  RLQ area of initial paracentesis with some bruising noted. Psych:  Alert and cooperative. Normal mood and affect.  Intake/Output  from previous day: 02/21 0701 - 02/22 0700 In: 865 [I.V.:815; IV Piggyback:50] Out: -  Intake/Output this shift: No intake/output data recorded.  Lab Results:  Recent Labs  11/09/16 1306 11/10/16 2042 11/11/16 0412  WBC 7.6 13.9* 9.8  HGB 15.4 12.1* 10.0*  HCT 47.4 36.8* 29.9*  PLT 261 445* 297   BMET  Recent Labs  11/10/16 2042 11/11/16 0412  NA 135 134*  K 5.4* 4.3  CL 99* 99*  CO2 26 27  GLUCOSE 162* 217*  BUN 34* 34*  CREATININE 1.68* 1.36*  CALCIUM 8.6* 7.9*   LFT  Recent Labs  11/10/16 2042 11/11/16 0412  PROT 5.7* 4.7*  ALBUMIN 2.7* 2.2*  AST 106* 81*  ALT 91* 75*  ALKPHOS 368* 289*  BILITOT 1.1 0.8   PT/INR  Recent Labs  11/09/16 1306  LABPROT 13.5  INR 1.03   Hepatitis Panel No results for input(s): HEPBSAG, HCVAB, HEPAIGM, HEPBIGM in the last 72 hours. C-Diff No results for input(s): CDIFFTOX in the last 72 hours.  Studies/Results: US Paracentesis  Result Date: 11/09/2016 INDICATION: 56 year old male with history of ascites.  Cirrhosis. EXAM: ULTRASOUND GUIDED  PARACENTESIS MEDICATIONS: None. COMPLICATIONS: None immediate. PROCEDURE: Informed written consent was obtained from the patient after a discussion of the risks, benefits and alternatives to treatment. A timeout was performed prior to the initiation of the procedure. Initial ultrasound scanning demonstrates a large amount of ascites within the right lower abdominal quadrant. The right lower abdomen was prepped and draped in the usual sterile fashion. 1% lidocaine with epinephrine was used for local anesthesia. Following this, a 19 gauge, 7-cm, Yueh catheter was  introduced. An ultrasound image was saved for documentation purposes. The paracentesis was performed. The catheter was removed and a dressing was applied. The patient tolerated the procedure well without immediate post procedural complication. FINDINGS: A total of approximately 4 L of serous fluid was removed. IMPRESSION:  Successful ultrasound-guided paracentesis yielding 4 liters of peritoneal fluid. Electronically Signed   By: Vinnie Langton M.D.   On: 11/09/2016 14:42    Impression: Pleasant 56 year old male with a history of cirrhosis who has been requiring increasing frequency of paracentesis. Last paracentesis 2 days ago in the day after he was very fatigued, did not get out of bed, early satiety, worsening abdominal pain. His abdominal pain became worse the day he presented to the ER. His abdomen is distended and tympanic on exam. In the ER 10 mL of fluid were drawn off the fluid pocket in his abdominal cavity with cell count unable to be completed due to the fluid clotting as it was serosanguineous. Fluid culture is pending. His lactic acid was elevated. His platelet count is typically normal. His hemoglobin has trended down from 15.4-12.1 in the emergency room. It is declined further from 12.1-10.0 over 6 hours through 4:00 in the morning this morning. He has been receiving hydration at that time. Also with acute kidney injury and hypotension. His INR is typically normal, but it has not been checked.  Differentials include traumatic paracentesis with continued bleeding. Cannot rule out free air. Also possibly spontaneous bacterial peritonitis, he is on Rocephin antibiotics prophylactically until cultures return. We will to continue to follow with you.  Plan: 1. Check ammonia level and INR 2. CT to rule out free abdominal air and to monitor fluid amount 3. Follow H/H closely - will recheck this evening 4. Pending mental status changes and ammonia level may need to add lactulose and/or Xifaxan 5. Consider titrating diuretics (add Lasix) when hypotension and AKI resolves 6. Transfuse as necessary 7. Follow for fluid culture; antibiotics pending culture 8. If continued accumulation of fluid, may need fluid removal 9. Supportive measures   Thank you for allowing [name] to participate in the care of Nicholas Napoleon, DNP, AGNP-C Adult & Gerontological Nurse Practitioner [facility]    LOS: 0 days     11/11/2016, 12:59 PM

## 2016-11-11 NOTE — Progress Notes (Signed)
PROGRESS NOTE    SHAWNTEZ CALDERONE  [id number] DOB: 01-Feb-1961 DOA: 11/10/2016 PCP: Petra Kuba, MD    Brief Narrative:  AARIZ [name] is a 56 y.o. male with medical history significant of NASH cirrhosis of the liver, AS, deafness uses aids, COPD, CRF on 3 liters day 5 liters qhs is getting paracentesis every couple weeks LVP last one yesterday.  He went to see his PCP on Tuesday and his sbp was in the 54s.  Wife and patient no med changes were made, he has continued to take his ARB and bblocker.  He has been more weak and getting dizzy.  Today his abdominal pain was worse than normal.  It is generalized.  He denies any fevers.  No urinary symptoms.  No sob.  No chest pain.  No melena, no brbpr and no vomiting or nausea.  Dr Eulis Foster in the ED did a tap in the ED and reports it was bloody, clotted before any labs could be done (only culture being done).  Pt found to be in AKI with drop in his hgb about 3 gms in a day.  No overt bleeding from GI system.  Referred for close monitoring of his hgb.    Assessment & Plan:   Principal Problem:   Abdominal pain Active Problems:   Type 2 diabetes mellitus (HCC)   Deafness congenital   Cardiac pacemaker in situ   Aortic stenosis   Hepatic cirrhosis (HCC)   Chronic diastolic heart failure (HCC)   Atrial fibrillation (HCC)   Ascites   Hypothyroidism   Anemia   Lactic acidosis   AKI (acute kidney injury) (HCC)   Hyperkalemia   Abdominal pain - possibly due to traumatic tap yesterday but cannot rule out underlying SBP - Continue iv rocephin until cx back.   - CT of abdomen ordered by gastroenterology to evaluate for possible free air  Ascites- noted - GI consulted  Hepatic cirrhosis ([place]) - requiring LVP frequently - concern for traumatic tap done 2 days ago - GI consulted - TIPS consideration moving forward per GI note  Hypotension  - not look septic - taking his bp meds despite low bps as outpatient which explains his  AKI - continue to hold bp meds - s/p gentle fluid bolus - procalitonin level elevated at 0.88    Anemia - decreased H/H this am - repeat this pm stable - monitor vitals closely     AKI (acute kidney injury) ([place]) - improved this am - Cr at 1.36 - repeat BMP in am  Lactic acidosis - improved this am      Hyperkalemia - improved this am - repeat BMP in am    Type 2 diabetes mellitus ([place]) - ssi    Deafness congenital- noted    Cardiac pacemaker in situ- noted    Aortic stenosis- noted - ensure BP does not go too low    Chronic diastolic heart failure ([place])- stable and compensated at this time, gentle ivf due to above issues    Atrial fibrillation ([place])- rate controlled at this time    Hypothyroidism- stable    DVT prophylaxis:  scds Code Status:   full Family Communication:  wife Disposition Plan:   pending stabilization of H/H and clearance by GI, patient still at risk of decompensation   Consultants:   GI  Procedures:   none  Antimicrobials:   Rocephin    Subjective: Patient sitting in bed this am.  Reports he feels slightly better then  when he came.  Wife reports that she is concerned that patient is bleeding in his abdomen.  Hoping Dr. Gala Romney will see patient as he knows patient.  Objective: Vitals:   11/11/16 0200 11/11/16 0313 11/11/16 0436 11/11/16 0628  BP: 93/73  103/69   Pulse: 101 (!) 106 (!) 106   Resp: 19 18    Temp:  98.3 F (36.8 C) 98.3 F (36.8 C)   TempSrc:  Oral Oral   SpO2: 95% 98%  95%  Weight:  71.7 kg (158 lb 1.1 oz)    Height:        Intake/Output Summary (Last 24 hours) at 11/11/16 0902 Last data filed at 11/11/16 [id number]  Gross per 24 hour  Intake              865 ml  Output                0 ml  Net              865 ml   Filed Weights   11/10/16 1853 11/11/16 0313  Weight: 69.4 kg (152 lb 14.4 oz) 71.7 kg (158 lb 1.1 oz)    Examination:  General exam: Appears calm and comfortable  Respiratory system:  Clear to auscultation. Respiratory effort normal. Cardiovascular system: S1 & S2 heard, RRR. No JVD, murmurs, rubs, gallops or clicks. No pedal edema. Gastrointestinal system: Abdomen is distended, soft and diffusely minimally tender. No organomegaly or masses felt. Normal bowel sounds heard. Central nervous system: Alert and oriented. No focal neurological deficits. Extremities: Symmetric 5 x 5 power. Skin: No rashes, lesions or ulcers, ecchymosis in RLQ Psychiatry: Judgement and insight appear normal. Mood & affect appropriate.     Data Reviewed: I have personally reviewed following labs and imaging studies  CBC:  Recent Labs Lab 11/09/16 1306 11/10/16 2042 11/11/16 0412  WBC 7.6 13.9* 9.8  HGB 15.4 12.1* 10.0*  HCT 47.4 36.8* 29.9*  MCV 97.9 96.1 95.8  PLT 261 445* [id number]   Basic Metabolic Panel:  Recent Labs Lab 11/10/16 2042 11/11/16 0412  NA 135 134*  K 5.4* 4.3  CL 99* 99*  CO2 26 27  GLUCOSE 162* 217*  BUN 34* 34*  CREATININE 1.68* 1.36*  CALCIUM 8.6* 7.9*   GFR: Estimated Creatinine Clearance: 53.9 mL/min (by C-G formula based on SCr of 1.36 mg/dL (H)). Liver Function Tests:  Recent Labs Lab 11/10/16 2042 11/11/16 0412  AST 106* 81*  ALT 91* 75*  ALKPHOS 368* 289*  BILITOT 1.1 0.8  PROT 5.7* 4.7*  ALBUMIN 2.7* 2.2*    Recent Labs Lab 11/10/16 2042  LIPASE 13   No results for input(s): AMMONIA in the last 168 hours. Coagulation Profile:  Recent Labs Lab 11/09/16 1306  INR 1.03   Cardiac Enzymes: No results for input(s): CKTOTAL, CKMB, CKMBINDEX, TROPONINI in the last 168 hours. BNP (last 3 results) No results for input(s): PROBNP in the last 8760 hours. HbA1C: No results for input(s): HGBA1C in the last 72 hours. CBG:  Recent Labs Lab 11/10/16 2346 11/11/16 0818  GLUCAP 143* 140*   Lipid Profile: No results for input(s): CHOL, HDL, LDLCALC, TRIG, CHOLHDL, LDLDIRECT in the last 72 hours. Thyroid Function Tests: No results for  input(s): TSH, T4TOTAL, FREET4, T3FREE, THYROIDAB in the last 72 hours. Anemia Panel: No results for input(s): VITAMINB12, FOLATE, FERRITIN, TIBC, IRON, RETICCTPCT in the last 72 hours. Sepsis Labs:  Recent Labs Lab 11/10/16 2229 11/10/16 2241 11/11/16 0412  PROCALCITON  --   --  0.88  LATICACIDVEN 3.1* 2.40* 1.5    Recent Results (from the past 240 hour(s))  Culture, blood (routine x 2)     Status: None (Preliminary result)   Collection Time: 11/10/16 10:18 PM  Result Value Ref Range Status   Specimen Description LEFT ANTECUBITAL  Final   Special Requests BOTTLES DRAWN AEROBIC AND ANAEROBIC 6CC  Final   Culture PENDING  Incomplete   Report Status PENDING  Incomplete  Culture, blood (routine x 2)     Status: None (Preliminary result)   Collection Time: 11/10/16 10:29 PM  Result Value Ref Range Status   Specimen Description LEFT ANTECUBITAL  Final   Special Requests BOTTLES DRAWN AEROBIC AND ANAEROBIC 6CC  Final   Culture PENDING  Incomplete   Report Status PENDING  Incomplete  Culture, body fluid-bottle     Status: None (Preliminary result)   Collection Time: 11/10/16 11:30 PM  Result Value Ref Range Status   Specimen Description SYNOVIAL  Final   Special Requests BOTTLES DRAWN AEROBIC ONLY 6CC DRAWN BY RN  Final   Culture PENDING  Incomplete   Report Status PENDING  Incomplete         Radiology Studies: US Paracentesis  Result Date: 11/09/2016 INDICATION: 56 year old male with history of ascites.  Cirrhosis. EXAM: ULTRASOUND GUIDED  PARACENTESIS MEDICATIONS: None. COMPLICATIONS: None immediate. PROCEDURE: Informed written consent was obtained from the patient after a discussion of the risks, benefits and alternatives to treatment. A timeout was performed prior to the initiation of the procedure. Initial ultrasound scanning demonstrates a large amount of ascites within the right lower abdominal quadrant. The right lower abdomen was prepped and draped in the usual sterile  fashion. 1% lidocaine with epinephrine was used for local anesthesia. Following this, a 19 gauge, 7-cm, Yueh catheter was introduced. An ultrasound image was saved for documentation purposes. The paracentesis was performed. The catheter was removed and a dressing was applied. The patient tolerated the procedure well without immediate post procedural complication. FINDINGS: A total of approximately 4 L of serous fluid was removed. IMPRESSION: Successful ultrasound-guided paracentesis yielding 4 liters of peritoneal fluid. Electronically Signed   By: Vinnie Langton M.D.   On: 11/09/2016 14:42        Scheduled Meds: . aspirin  325 mg Oral Daily  . cefTRIAXone (ROCEPHIN)  IV  2 g Intravenous Q24H  . insulin aspart  0-9 Units Subcutaneous TID WC  . levothyroxine  50 mcg Oral QAC breakfast  . metoprolol succinate  25 mg Oral Daily  . modafinil  200 mg Oral Daily  . multivitamin with minerals  1 tablet Oral Daily  . povidone-iodine      . spironolactone  50 mg Oral Daily  . ursodiol  500 mg Oral BID   Continuous Infusions: . sodium chloride 100 mL/hr at 11/11/16 0324     LOS: 0 days    Time spent: 35 minutes    Loretha Stapler, MD [facility] Pager [phone]  If 7PM-7AM, please contact night-coverage [email or web address] Password [email or web address] 11/11/2016, 9:02 AM

## 2016-11-11 NOTE — Telephone Encounter (Signed)
Communication noted.  

## 2016-11-11 NOTE — ED Notes (Signed)
Jamie with lab called and stated that due to the consistency of the specimen, the tests ordered are unable to be performed except a body fluid culture; Dr. Eulis Foster notified and he stated to just run the body fluid culture

## 2016-11-12 DIAGNOSIS — H905 Unspecified sensorineural hearing loss: Secondary | ICD-10-CM

## 2016-11-12 LAB — CBC WITH DIFFERENTIAL/PLATELET
BASOS ABS: 0.1 10*3/uL (ref 0.0–0.1)
BASOS PCT: 1 %
EOS ABS: 0.1 10*3/uL (ref 0.0–0.7)
Eosinophils Relative: 1 %
HCT: 31 % — ABNORMAL LOW (ref 39.0–52.0)
Hemoglobin: 10 g/dL — ABNORMAL LOW (ref 13.0–17.0)
LYMPHS PCT: 14 %
Lymphs Abs: 1.4 10*3/uL (ref 0.7–4.0)
MCH: 31.4 pg (ref 26.0–34.0)
MCHC: 32.3 g/dL (ref 30.0–36.0)
MCV: 97.5 fL (ref 78.0–100.0)
Monocytes Absolute: 1.6 10*3/uL — ABNORMAL HIGH (ref 0.1–1.0)
Monocytes Relative: 17 %
Neutro Abs: 6.7 10*3/uL (ref 1.7–7.7)
Neutrophils Relative %: 67 %
PLATELETS: 309 10*3/uL (ref 150–400)
RBC: 3.18 MIL/uL — AB (ref 4.22–5.81)
RDW: 16.1 % — AB (ref 11.5–15.5)
WBC: 9.9 10*3/uL (ref 4.0–10.5)

## 2016-11-12 LAB — GLUCOSE, CAPILLARY
GLUCOSE-CAPILLARY: 117 mg/dL — AB (ref 65–99)
GLUCOSE-CAPILLARY: 253 mg/dL — AB (ref 65–99)
GLUCOSE-CAPILLARY: 175 mg/dL — AB (ref 65–99)
GLUCOSE-CAPILLARY: 277 mg/dL — AB (ref 65–99)
Glucose-Capillary: 104 mg/dL — ABNORMAL HIGH (ref 65–99)

## 2016-11-12 LAB — BASIC METABOLIC PANEL
Anion gap: 8 (ref 5–15)
BUN: 25 mg/dL — AB (ref 6–20)
CALCIUM: 7.9 mg/dL — AB (ref 8.9–10.3)
CO2: 25 mmol/L (ref 22–32)
CREATININE: 0.92 mg/dL (ref 0.61–1.24)
Chloride: 101 mmol/L (ref 101–111)
GFR calc Af Amer: >60 mL/min (ref >60)
GLUCOSE: 106 mg/dL — AB (ref 65–99)
POTASSIUM: 4.2 mmol/L (ref 3.5–5.1)
SODIUM: 134 mmol/L — AB (ref 135–145)

## 2016-11-12 MED ORDER — FENTANYL CITRATE (PF) 100 MCG/2ML IJ SOLN
25.0000 ug | Freq: Once | INTRAMUSCULAR | Status: AC
  Administered 2016-11-12: 25 ug via INTRAVENOUS
  Filled 2016-11-12: qty 2

## 2016-11-12 NOTE — Progress Notes (Signed)
PROGRESS NOTE    [name]  [id number] DOB: 1961/08/06 DOA: 11/10/2016 PCP: Petra Kuba, MD    Brief Narrative:  [name] is a 56 y.o. male with medical history significant of NASH cirrhosis of the liver, AS, deafness uses aids, COPD, CRF on 3 liters day 5 liters qhs is getting paracentesis every couple weeks LVP last one yesterday.  He went to see his PCP on Tuesday and his sbp was in the 1s.  Wife and patient no med changes were made, he has continued to take his ARB and bblocker.  He has been more weak and getting dizzy.  Today his abdominal pain was worse than normal.  It is generalized.  He denies any fevers.  No urinary symptoms.  No sob.  No chest pain.  No melena, no brbpr and no vomiting or nausea.  Dr Eulis Foster in the ED did a tap in the ED and reports it was bloody, clotted before any labs could be done (only culture being done).  Pt found to be in AKI with drop in his hgb about 3 gms in a day.  No overt bleeding from GI system.  Referred for close monitoring of his hgb.    Assessment & Plan:   Principal Problem:   Abdominal pain Active Problems:   Type 2 diabetes mellitus (HCC)   Deafness congenital   Cardiac pacemaker in situ   Aortic stenosis   Hepatic cirrhosis (HCC)   Chronic diastolic heart failure (HCC)   Atrial fibrillation (HCC)   Ascites   Hypothyroidism   Anemia   Lactic acidosis   AKI (acute kidney injury) (HCC)   Hyperkalemia   Abdominal pain - likely due to traumatic tap but cannot rule out underlying SBP - Continue iv rocephin until cx back.   - CT of abdomen showed no free air but Large amount of relatively high density peritoneal fluid with clot in the cul-de-sac suggesting hemorrhage into ascites. No evidence of active extravasation.  Ascites- noted - GI consulted - appreciate GI recommendations  Hepatic cirrhosis (HCC) - requiring LVP frequently - concern for traumatic tap done 3 days ago - GI consulted - TIPS  consideration moving forward per GI note - management per GI  Hypotension  - not septic - taking his bp meds despite low bps as outpatient which explains his AKI - can continue metoprolol and spironolactone - s/p gentle fluid bolus - procalitonin level elevated at 0.88    Anemia - stable since yesterday am - repeat this pm stable - monitor vitals closely    AKI (acute kidney injury) ([place]) - improved this am - Cr at 0.92 - monitor kidney function daily    Hyperkalemia - WNL  Type 2 diabetes mellitus ([place]) - ssi    Deafness congenital- noted    Cardiac pacemaker in situ- noted    Aortic stenosis- noted - ensure BP does not go too low    Chronic diastolic heart failure ([place])- monitor for fluid overload- currently appears euvolemic    Atrial fibrillation ([place])- rate controlled at this time    Hypothyroidism- stable    DVT prophylaxis:  scds Code Status:   full Family Communication:  wife Disposition Plan:   pending stabilization of H/H and clearance by GI, patient still at risk of decompensation   Consultants:   GI  Procedures:   none  Antimicrobials:   Rocephin    Subjective: Patient sitting up in bed coloring in a coloring book.  He is  in good spirits this am and is smiling throughout interview and exam.  No abdominal pain today.  Vitals overnight remained WNL and blood count stable as well.  Objective: Vitals:   11/11/16 0628 11/11/16 1528 11/11/16 2252 11/12/16 0531  BP:  109/72 101/78 103/75  Pulse:  89 96 65  Resp:  20 18 15   Temp:  98.3 F (36.8 C) 97.9 F (36.6 C) 98 F (36.7 C)  TempSrc:  Axillary Oral Oral  SpO2: 95% 97%  95%  Weight:    76.9 kg (169 lb 8.5 oz)  Height:        Intake/Output Summary (Last 24 hours) at 11/12/16 1309 Last data filed at 11/11/16 2300  Gross per 24 hour  Intake             1765 ml  Output                0 ml  Net             1765 ml   Filed Weights   11/10/16 1853 11/11/16 0313 11/12/16 0531    Weight: 69.4 kg (152 lb 14.4 oz) 71.7 kg (158 lb 1.1 oz) 76.9 kg (169 lb 8.5 oz)    Examination:  General exam: Appears calm and comfortable  Respiratory system: Clear to auscultation. Respiratory effort normal. Cardiovascular system: S1 & S2 heard, RRR. No JVD, murmurs, rubs, gallops or clicks. No pedal edema. Gastrointestinal system: Abdomen is distended, soft and nontender. No organomegaly or masses felt. Normal bowel sounds heard. Central nervous system: Alert and oriented. No focal neurological deficits. Extremities: Symmetric 5 x 5 power. Skin: No rashes, lesions or ulcers, ecchymosis in RLQ Psychiatry: Mood & affect appropriate.     Data Reviewed: I have personally reviewed following labs and imaging studies  CBC:  Recent Labs Lab 11/09/16 1306 11/10/16 2042 11/11/16 0412 11/11/16 1536 11/12/16 0508  WBC 7.6 13.9* 9.8  --  9.9  NEUTROABS  --   --   --   --  6.7  HGB 15.4 12.1* 10.0* 10.0* 10.0*  HCT 47.4 36.8* 29.9* 30.0* 31.0*  MCV 97.9 96.1 95.8  --  97.5  PLT 261 445* 297  --  [id number]   Basic Metabolic Panel:  Recent Labs Lab 11/10/16 2042 11/11/16 0412 11/12/16 0508  NA 135 134* 134*  K 5.4* 4.3 4.2  CL 99* 99* 101  CO2 26 27 25   GLUCOSE 162* 217* 106*  BUN 34* 34* 25*  CREATININE 1.68* 1.36* 0.92  CALCIUM 8.6* 7.9* 7.9*   GFR: Estimated Creatinine Clearance: 82.3 mL/min (by C-G formula based on SCr of 0.92 mg/dL). Liver Function Tests:  Recent Labs Lab 11/10/16 2042 11/11/16 0412  AST 106* 81*  ALT 91* 75*  ALKPHOS 368* 289*  BILITOT 1.1 0.8  PROT 5.7* 4.7*  ALBUMIN 2.7* 2.2*    Recent Labs Lab 11/10/16 2042  LIPASE 13    Recent Labs Lab 11/11/16 1532  AMMONIA 17   Coagulation Profile:  Recent Labs Lab 11/09/16 1306 11/11/16 1536  INR 1.03 1.16   Cardiac Enzymes: No results for input(s): CKTOTAL, CKMB, CKMBINDEX, TROPONINI in the last 168 hours. BNP (last 3 results) No results for input(s): PROBNP in the last 8760  hours. HbA1C: No results for input(s): HGBA1C in the last 72 hours. CBG:  Recent Labs Lab 11/11/16 1205 11/11/16 1648 11/11/16 2250 11/12/16 0756 11/12/16 1128  GLUCAP 227* 161* 117* 104* 253*   Lipid Profile: No results for input(s): CHOL,  HDL, LDLCALC, TRIG, CHOLHDL, LDLDIRECT in the last 72 hours. Thyroid Function Tests: No results for input(s): TSH, T4TOTAL, FREET4, T3FREE, THYROIDAB in the last 72 hours. Anemia Panel: No results for input(s): VITAMINB12, FOLATE, FERRITIN, TIBC, IRON, RETICCTPCT in the last 72 hours. Sepsis Labs:  Recent Labs Lab 11/10/16 2229 11/10/16 2241 11/11/16 0412  PROCALCITON  --   --  0.88  LATICACIDVEN 3.1* 2.40* 1.5    Recent Results (from the past 240 hour(s))  Culture, blood (routine x 2)     Status: None (Preliminary result)   Collection Time: 11/10/16 10:18 PM  Result Value Ref Range Status   Specimen Description LEFT ANTECUBITAL  Final   Special Requests BOTTLES DRAWN AEROBIC AND ANAEROBIC 6CC  Final   Culture NO GROWTH 2 DAYS  Final   Report Status PENDING  Incomplete  Culture, blood (routine x 2)     Status: None (Preliminary result)   Collection Time: 11/10/16 10:29 PM  Result Value Ref Range Status   Specimen Description LEFT ANTECUBITAL  Final   Special Requests BOTTLES DRAWN AEROBIC AND ANAEROBIC 6CC  Final   Culture NO GROWTH 2 DAYS  Final   Report Status PENDING  Incomplete  Culture, body fluid-bottle     Status: None (Preliminary result)   Collection Time: 11/10/16 11:30 PM  Result Value Ref Range Status   Specimen Description SYNOVIAL  Final   Special Requests BOTTLES DRAWN AEROBIC ONLY 6CC DRAWN BY RN  Final   Culture NO GROWTH 1 DAY  Final   Report Status PENDING  Incomplete         Radiology Studies: Ct Abdomen Pelvis W Contrast  Result Date: 11/11/2016 CLINICAL DATA:  cirrhosis who has been requiring increasing frequency of paracentesis. Last paracentesis 2 days ago in the day after he was very fatigued,  did not get out of bed, early satiety, worsening abdominal pain. His abdominal pain became worse the day he presented to the ER. His abdomen is distended and tympanic on exam. In the ER 10 mL of fluid were drawn off the fluid pocket in his abdominal cavity with cell count unable to be completed due to the fluid clotting as it was serosanguineous. Fluid culture is pending. His lactic acid was elevated. His platelet count is typically normal. His hemoglobin has trended down from 15.4-12.1 in the emergency room. It is declined further from 12.1-10.0 over 6 hours through 4:00 in the morning this morning. He has been receiving hydration at that time EXAM: CT ABDOMEN AND PELVIS WITH CONTRAST TECHNIQUE: Multidetector CT imaging of the abdomen and pelvis was performed using the standard protocol following bolus administration of intravenous contrast. CONTRAST:  181mL ISOVUE-300 IOPAMIDOL (ISOVUE-300) INJECTION 61% COMPARISON:  Ultrasound 10/27/2016, CT 07/15/2016 FINDINGS: Lower chest: Transvenous pacing leads partially visualized. Visualized lung bases clear. No pleural or pericardial effusion. Aortic valve calcifications. Hepatobiliary: Nodular liver contour. No focal lesion or biliary ductal dilatation. The gallbladder is nondistended. Pancreas: Unremarkable. No pancreatic ductal dilatation or surrounding inflammatory changes. Spleen: Normal in size without focal abnormality. Adrenals/Urinary Tract: Normal adrenal glands. Focal areas of parenchymal loss in the right kidney. No renal mass, urolithiasis, or hydronephrosis. Urinary bladder is nondistended. Stomach/Bowel: Stomach is incompletely distended, unremarkable. Small bowel is nondilated. There is good distal passage of the oral contrast material. Normal appendix. The colon is nondilated. Vascular/Lymphatic: Portal vein patent. No enlarged venous collateral channels evident. Scattered aortic plaque without aneurysm. No adenopathy localized. Reproductive: Prostate is  unremarkable. Other: There is a large amount  of relatively high attenuation abdominal ascites with some hyperdense clot in the cul-de-sac. No active extravasation. No abnormal peritoneal enhancement. No free air. Musculoskeletal: No acute or significant osseous findings. IMPRESSION: 1. Large amount of relatively high density peritoneal fluid with clot in the cul-de-sac suggesting hemorrhage into ascites. No evidence of active extravasation. 2. Hepatic cirrhosis. No additional stigmata of portal venous hypertension. Electronically Signed   By: Lucrezia Europe M.D.   On: 11/11/2016 20:11        Scheduled Meds: . aspirin  325 mg Oral Daily  . cefTRIAXone (ROCEPHIN)  IV  2 g Intravenous Q24H  . insulin aspart  0-9 Units Subcutaneous TID WC  . levothyroxine  50 mcg Oral QAC breakfast  . metoprolol succinate  25 mg Oral Daily  . modafinil  200 mg Oral Daily  . multivitamin with minerals  1 tablet Oral Daily  . spironolactone  50 mg Oral Daily  . ursodiol  500 mg Oral BID   Continuous Infusions: . sodium chloride 100 mL/hr at 11/11/16 2237     LOS: 1 day    Time spent: 30 minutes    Loretha Stapler, MD [facility] Pager [phone]  If 7PM-7AM, please contact night-coverage [email or web address] Password [email or web address] 11/12/2016, 1:09 PM

## 2016-11-12 NOTE — Care Management Important Message (Signed)
Important Message  Patient Details  Name: [name] MRN: [id number] Date of Birth: 03/20/1961   Medicare Important Message Given:  Yes    Sherald Barge, RN 11/12/2016, 12:27 PM

## 2016-11-12 NOTE — Progress Notes (Signed)
Subjective: Feeling better today. No abdominal pain, N/V. No hematochezia or melena. Had a bowel movement this morning. No other upper or lower GI symptoms. Denies chest pain, dyspnea, dizziness, syncope, near syncope.  Objective: Vital signs in last 24 hours: Temp:  [97.9 F (36.6 C)-98.3 F (36.8 C)] 98 F (36.7 C) (02/23 0531) Pulse Rate:  [65-96] 65 (02/23 0531) Resp:  [15-20] 15 (02/23 0531) BP: (101-109)/(72-78) 103/75 (02/23 0531) SpO2:  [95 %-97 %] 95 % (02/23 0531) Weight:  [169 lb 8.5 oz (76.9 kg)] 169 lb 8.5 oz (76.9 kg) (02/23 0531)   General:   Alert and oriented, pleasant. Sitting on the edge of the bed coloring. Wife in the room. Head:  Normocephalic and atraumatic. Eyes:  No icterus, sclera clear. Conjuctiva pink.  Heart:  S1, S2 present, no murmurs noted.  Lungs: Clear to auscultation bilaterally, without wheezing, rales, or rhonchi.  Abdomen:  Bowel sounds present, distended, no TTP, firm but no tense ascites. No HSM or hernias noted. No rebound or guarding. Msk:  Symmetrical without gross deformities. Extremities:  Without clubbing or edema. Neurologic:  Alert and  oriented x4;  grossly normal neurologically. Psych:  Alert and cooperative. Normal mood and affect.  Intake/Output from previous day: 02/22 0701 - 02/23 0700 In: 1765 [P.O.:120; I.V.:1645] Out: -  Intake/Output this shift: No intake/output data recorded.  Lab Results:  Recent Labs  11/10/16 2042 11/11/16 0412 11/11/16 1536 11/12/16 0508  WBC 13.9* 9.8  --  9.9  HGB 12.1* 10.0* 10.0* 10.0*  HCT 36.8* 29.9* 30.0* 31.0*  PLT 445* 297  --  309   BMET  Recent Labs  11/10/16 2042 11/11/16 0412 11/12/16 0508  NA 135 134* 134*  K 5.4* 4.3 4.2  CL 99* 99* 101  CO2 26 27 25   GLUCOSE 162* 217* 106*  BUN 34* 34* 25*  CREATININE 1.68* 1.36* 0.92  CALCIUM 8.6* 7.9* 7.9*   LFT  Recent Labs  11/10/16 2042 11/11/16 0412  PROT 5.7* 4.7*  ALBUMIN 2.7* 2.2*  AST 106* 81*  ALT 91*  75*  ALKPHOS 368* 289*  BILITOT 1.1 0.8   PT/INR  Recent Labs  11/09/16 1306 11/11/16 1536  LABPROT 13.5 14.9  INR 1.03 1.16   Hepatitis Panel No results for input(s): HEPBSAG, HCVAB, HEPAIGM, HEPBIGM in the last 72 hours.   Studies/Results: Ct Abdomen Pelvis W Contrast  Result Date: 11/11/2016 CLINICAL DATA:  cirrhosis who has been requiring increasing frequency of paracentesis. Last paracentesis 2 days ago in the day after he was very fatigued, did not get out of bed, early satiety, worsening abdominal pain. His abdominal pain became worse the day he presented to the ER. His abdomen is distended and tympanic on exam. In the ER 10 mL of fluid were drawn off the fluid pocket in his abdominal cavity with cell count unable to be completed due to the fluid clotting as it was serosanguineous. Fluid culture is pending. His lactic acid was elevated. His platelet count is typically normal. His hemoglobin has trended down from 15.4-12.1 in the emergency room. It is declined further from 12.1-10.0 over 6 hours through 4:00 in the morning this morning. He has been receiving hydration at that time EXAM: CT ABDOMEN AND PELVIS WITH CONTRAST TECHNIQUE: Multidetector CT imaging of the abdomen and pelvis was performed using the standard protocol following bolus administration of intravenous contrast. CONTRAST:  175mL ISOVUE-300 IOPAMIDOL (ISOVUE-300) INJECTION 61% COMPARISON:  Ultrasound 10/27/2016, CT 07/15/2016 FINDINGS: Lower chest: Transvenous pacing  leads partially visualized. Visualized lung bases clear. No pleural or pericardial effusion. Aortic valve calcifications. Hepatobiliary: Nodular liver contour. No focal lesion or biliary ductal dilatation. The gallbladder is nondistended. Pancreas: Unremarkable. No pancreatic ductal dilatation or surrounding inflammatory changes. Spleen: Normal in size without focal abnormality. Adrenals/Urinary Tract: Normal adrenal glands. Focal areas of parenchymal loss in  the right kidney. No renal mass, urolithiasis, or hydronephrosis. Urinary bladder is nondistended. Stomach/Bowel: Stomach is incompletely distended, unremarkable. Small bowel is nondilated. There is good distal passage of the oral contrast material. Normal appendix. The colon is nondilated. Vascular/Lymphatic: Portal vein patent. No enlarged venous collateral channels evident. Scattered aortic plaque without aneurysm. No adenopathy localized. Reproductive: Prostate is unremarkable. Other: There is a large amount of relatively high attenuation abdominal ascites with some hyperdense clot in the cul-de-sac. No active extravasation. No abnormal peritoneal enhancement. No free air. Musculoskeletal: No acute or significant osseous findings. IMPRESSION: 1. Large amount of relatively high density peritoneal fluid with clot in the cul-de-sac suggesting hemorrhage into ascites. No evidence of active extravasation. 2. Hepatic cirrhosis. No additional stigmata of portal venous hypertension. Electronically Signed   By: Lucrezia Europe M.D.   On: 11/11/2016 20:11    Assessment: Pleasant 56 year old male with a history of cirrhosis who has been requiring increasing frequency of paracentesis. Last paracentesis 2 days ago in the day after he was very fatigued, did not get out of bed, early satiety, worsening abdominal pain. His abdominal pain became worse the day he presented to the ER. His abdomen is distended and tympanic on exam. In the ER 10 mL of fluid were drawn off the fluid pocket in his abdominal cavity with cell count unable to be completed due to the fluid clotting as it was serosanguineous. Fluid culture is pending. His lactic acid was elevated. His platelet count is typically normal. His hemoglobin has trended down from 15.4-12.1 in the emergency room. It is declined further from 12.1-10.0 over 6 hours through 4:00 in the morning this morning. He has been receiving hydration at that time. Also with acute kidney injury  and hypotension. His INR is typically normal, but it has not been checked.  CT report yesterday reviewed which demonstrated large amount of high density peritoneal fluid with clot suggesting hemorrhage into ascites. No free air. Likely traumatic paracentesis. Dr. Gala Romney feels TIPS may be indicated in the near future due to increasing frequency of paracentesis. Still on antibiotics for prophylactic coverage of SBP until cultures come back. Hgb yesterday stabalized at 10.0. Blood cultures negative to date. Ammonia normal. INR normal. Plt count normal. Peritoneal fluid culture negative <12 hours.  Today he seems improved, no further abdominal pain. Labs look good so far. Continued abdominal distension with CT noting ascites with clots likely hemorrhage into ascites. May need a paracentesis again, but somewhat hesitant with recent traumatic para. Will discuss with Dr. Oneida Alar. Not significantly symptomatic at this point. Possible long-term solution of TIPS. Cr improved to normal today and can consider titrating his diuresis regimen in the office for better control. Is currently only on Spironolactone.  Plan: 1. Moinitor H/H for further decline 2. Monitor for any worsening abdominal pain 3. Consider paracentesis if continued/worsening ascites 4. Has appointment 3/5 in our office for folowup already scheduled 5. Supportive measures   Thank you for allowing [name] to participate in the care of Janne Napoleon, DNP, AGNP-C Adult & Gerontological Nurse Practitioner [facility]     LOS: 1 day  11/12/2016, 8:41 AM

## 2016-11-13 ENCOUNTER — Inpatient Hospital Stay (HOSPITAL_COMMUNITY): Payer: Medicare Other

## 2016-11-13 LAB — GLUCOSE, CAPILLARY
GLUCOSE-CAPILLARY: 227 mg/dL — AB (ref 65–99)
Glucose-Capillary: 264 mg/dL — ABNORMAL HIGH (ref 65–99)
Glucose-Capillary: 272 mg/dL — ABNORMAL HIGH (ref 65–99)
Glucose-Capillary: 220 mg/dL — ABNORMAL HIGH (ref 65–99)
Glucose-Capillary: 222 mg/dL — ABNORMAL HIGH (ref 65–99)

## 2016-11-13 LAB — PROCALCITONIN: Procalcitonin: 0.39 ng/mL

## 2016-11-13 MED ORDER — FENTANYL CITRATE (PF) 100 MCG/2ML IJ SOLN
12.5000 ug | Freq: Once | INTRAMUSCULAR | Status: AC
  Administered 2016-11-13: 12.5 ug via INTRAVENOUS
  Filled 2016-11-13: qty 2

## 2016-11-13 NOTE — Progress Notes (Signed)
PROGRESS NOTE    [name]  [id number] DOB: Mar 23, 1961 DOA: 11/10/2016 PCP: Nicholas Kuba, MD    Brief Narrative:  [name] is a 56 y.o. male with medical history significant of NASH cirrhosis of the liver, AS, deafness uses aids, COPD, CRF on 3 liters day 5 liters qhs is getting paracentesis every couple weeks LVP last one yesterday.  He went to see his PCP on Tuesday and his sbp was in the 40s.  Wife and patient no med changes were made, he has continued to take his ARB and bblocker.  He has been more weak and getting dizzy.  Today his abdominal pain was worse than normal.  It is generalized.  He denies any fevers.  No urinary symptoms.  No sob.  No chest pain.  No melena, no brbpr and no vomiting or nausea.  Dr [name] in the ED did a tap in the ED and reports it was bloody, clotted before any labs could be done (only culture being done).  Pt found to be in AKI with drop in his hgb about 3 gms in a day.  No overt bleeding from GI system.  Referred for close monitoring of his hgb.    Assessment & Plan:   Principal Problem:   Abdominal pain Active Problems:   Type 2 diabetes mellitus (HCC)   Deafness congenital   Cardiac pacemaker in situ   Aortic stenosis   Hepatic cirrhosis (HCC)   Chronic diastolic heart failure (HCC)   Atrial fibrillation (HCC)   Ascites   Hypothyroidism   Anemia   Lactic acidosis   AKI (acute kidney injury) (HCC)   Hyperkalemia   Abdominal pain - likely due to traumatic tap but cannot rule out underlying SBP - Continue iv rocephin until cx back.   - CT of abdomen showed no free air but Large amount of relatively high density peritoneal fluid with clot in the cul-de-sac suggesting hemorrhage into ascites. No evidence of active extravasation. - fentanyl for pain  Ascites- noted - GI consulted - appreciate GI recommendations  Hepatic cirrhosis (HCC) - requiring LVP frequently - concern for traumatic tap done 3 days ago - GI  consulted - TIPS consideration moving forward per GI note - management per GI  Hypotension  - not septic - taking his bp meds despite low bps as outpatient which explains his AKI - can continue metoprolol and spironolactone - procalitonin level elevated at 0.88    Anemia - stable  - repeat CBC in am - monitor vitals closely    AKI (acute kidney injury) ([place]) - improved - repeat BMP in am    Hyperkalemia - WNL  Type 2 diabetes mellitus ([place]) - ssi    Deafness congenital- noted    Cardiac pacemaker in situ- noted    Aortic stenosis- noted - ensure BP does not go too low    Chronic diastolic heart failure (HCC)- monitor for fluid overload- currently appears euvolemic    Atrial fibrillation ([place])- rate controlled at this time    Hypothyroidism- stable    DVT prophylaxis:  scds Code Status:   full Family Communication:  wife Disposition Plan:   pending stabilization of H/H and clearance by GI, patient still at risk of decompensation   Consultants:   GI  Procedures:   none  Antimicrobials:   Rocephin    Subjective: Patient reports his abdominal pain is worsened by palpation.  Having some gas. Wife is concerned about patient's abdominal pain.  Asking  for diet to be advanced.  Objective: Vitals:   11/12/16 2346 11/13/16 0500 11/13/16 0751 11/13/16 1534  BP: 94/63 107/67  115/78  Pulse: 92 90  92  Resp: 18 18  20   Temp: 97.5 F (36.4 C) 97.9 F (36.6 C)  97.6 F (36.4 C)  TempSrc: Oral Oral  Oral  SpO2: 93% 91% 96% 95%  Weight:  76.5 kg (168 lb 10.4 oz)    Height:        Intake/Output Summary (Last 24 hours) at 11/13/16 1816 Last data filed at 11/12/16 2100  Gross per 24 hour  Intake              240 ml  Output              200 ml  Net               40 ml   Filed Weights   11/11/16 0313 11/12/16 0531 11/13/16 0500  Weight: 71.7 kg (158 lb 1.1 oz) 76.9 kg (169 lb 8.5 oz) 76.5 kg (168 lb 10.4 oz)    Examination:  General exam: Appears  calm and comfortable  Respiratory system: Clear to auscultation. Respiratory effort normal. Cardiovascular system: S1 & S2 heard, RRR. No JVD, murmurs, rubs, gallops or clicks. No pedal edema. Gastrointestinal system: Abdomen is distended, soft and nontender. No organomegaly or masses felt. Normal bowel sounds heard. Central nervous system: Alert; No focal neurological deficits. Extremities: Symmetric 5 x 5 power. Skin: No rashes, lesions or ulcers, ecchymosis in RLQ Psychiatry: Mood & affect appropriate- very happy    Data Reviewed: I have personally reviewed following labs and imaging studies  CBC:  Recent Labs Lab 11/09/16 1306 11/10/16 2042 11/11/16 0412 11/11/16 1536 11/12/16 0508  WBC 7.6 13.9* 9.8  --  9.9  NEUTROABS  --   --   --   --  6.7  HGB 15.4 12.1* 10.0* 10.0* 10.0*  HCT 47.4 36.8* 29.9* 30.0* 31.0*  MCV 97.9 96.1 95.8  --  97.5  PLT 261 445* 297  --  [id number]   Basic Metabolic Panel:  Recent Labs Lab 11/10/16 2042 11/11/16 0412 11/12/16 0508  NA 135 134* 134*  K 5.4* 4.3 4.2  CL 99* 99* 101  CO2 26 27 25   GLUCOSE 162* 217* 106*  BUN 34* 34* 25*  CREATININE 1.68* 1.36* 0.92  CALCIUM 8.6* 7.9* 7.9*   GFR: Estimated Creatinine Clearance: 82 mL/min (by C-G formula based on SCr of 0.92 mg/dL). Liver Function Tests:  Recent Labs Lab 11/10/16 2042 11/11/16 0412  AST 106* 81*  ALT 91* 75*  ALKPHOS 368* 289*  BILITOT 1.1 0.8  PROT 5.7* 4.7*  ALBUMIN 2.7* 2.2*    Recent Labs Lab 11/10/16 2042  LIPASE 13    Recent Labs Lab 11/11/16 1532  AMMONIA 17   Coagulation Profile:  Recent Labs Lab 11/09/16 1306 11/11/16 1536  INR 1.03 1.16   Cardiac Enzymes: No results for input(s): CKTOTAL, CKMB, CKMBINDEX, TROPONINI in the last 168 hours. BNP (last 3 results) No results for input(s): PROBNP in the last 8760 hours. HbA1C: No results for input(s): HGBA1C in the last 72 hours. CBG:  Recent Labs Lab 11/12/16 2034 11/13/16 0526  11/13/16 0746 11/13/16 1124 11/13/16 1606  GLUCAP 277* 264* 227* 272* 220*   Lipid Profile: No results for input(s): CHOL, HDL, LDLCALC, TRIG, CHOLHDL, LDLDIRECT in the last 72 hours. Thyroid Function Tests: No results for input(s): TSH, T4TOTAL, FREET4, T3FREE, THYROIDAB in the  last 72 hours. Anemia Panel: No results for input(s): VITAMINB12, FOLATE, FERRITIN, TIBC, IRON, RETICCTPCT in the last 72 hours. Sepsis Labs:  Recent Labs Lab 11/10/16 2229 11/10/16 2241 11/11/16 0412 11/13/16 0720  PROCALCITON  --   --  0.88 0.39  LATICACIDVEN 3.1* 2.40* 1.5  --     Recent Results (from the past 240 hour(s))  Culture, blood (routine x 2)     Status: None (Preliminary result)   Collection Time: 11/10/16 10:18 PM  Result Value Ref Range Status   Specimen Description LEFT ANTECUBITAL  Final   Special Requests BOTTLES DRAWN AEROBIC AND ANAEROBIC 6CC  Final   Culture NO GROWTH 3 DAYS  Final   Report Status PENDING  Incomplete  Culture, blood (routine x 2)     Status: None (Preliminary result)   Collection Time: 11/10/16 10:29 PM  Result Value Ref Range Status   Specimen Description LEFT ANTECUBITAL  Final   Special Requests BOTTLES DRAWN AEROBIC AND ANAEROBIC 6CC  Final   Culture NO GROWTH 3 DAYS  Final   Report Status PENDING  Incomplete  Culture, body fluid-bottle     Status: None (Preliminary result)   Collection Time: 11/10/16 11:30 PM  Result Value Ref Range Status   Specimen Description SYNOVIAL  Final   Special Requests BOTTLES DRAWN AEROBIC ONLY 6CC DRAWN BY RN  Final   Culture NO GROWTH 2 DAYS  Final   Report Status PENDING  Incomplete         Radiology Studies: Ct Abdomen Pelvis W Contrast  Result Date: 11/11/2016 CLINICAL DATA:  cirrhosis who has been requiring increasing frequency of paracentesis. Last paracentesis 2 days ago in the day after he was very fatigued, did not get out of bed, early satiety, worsening abdominal pain. His abdominal pain became worse the  day he presented to the ER. His abdomen is distended and tympanic on exam. In the ER 10 mL of fluid were drawn off the fluid pocket in his abdominal cavity with cell count unable to be completed due to the fluid clotting as it was serosanguineous. Fluid culture is pending. His lactic acid was elevated. His platelet count is typically normal. His hemoglobin has trended down from 15.4-12.1 in the emergency room. It is declined further from 12.1-10.0 over 6 hours through 4:00 in the morning this morning. He has been receiving hydration at that time EXAM: CT ABDOMEN AND PELVIS WITH CONTRAST TECHNIQUE: Multidetector CT imaging of the abdomen and pelvis was performed using the standard protocol following bolus administration of intravenous contrast. CONTRAST:  181mL ISOVUE-300 IOPAMIDOL (ISOVUE-300) INJECTION 61% COMPARISON:  Ultrasound 10/27/2016, CT 07/15/2016 FINDINGS: Lower chest: Transvenous pacing leads partially visualized. Visualized lung bases clear. No pleural or pericardial effusion. Aortic valve calcifications. Hepatobiliary: Nodular liver contour. No focal lesion or biliary ductal dilatation. The gallbladder is nondistended. Pancreas: Unremarkable. No pancreatic ductal dilatation or surrounding inflammatory changes. Spleen: Normal in size without focal abnormality. Adrenals/Urinary Tract: Normal adrenal glands. Focal areas of parenchymal loss in the right kidney. No renal mass, urolithiasis, or hydronephrosis. Urinary bladder is nondistended. Stomach/Bowel: Stomach is incompletely distended, unremarkable. Small bowel is nondilated. There is good distal passage of the oral contrast material. Normal appendix. The colon is nondilated. Vascular/Lymphatic: Portal vein patent. No enlarged venous collateral channels evident. Scattered aortic plaque without aneurysm. No adenopathy localized. Reproductive: Prostate is unremarkable. Other: There is a large amount of relatively high attenuation abdominal ascites with  some hyperdense clot in the cul-de-sac. No active extravasation. No abnormal  peritoneal enhancement. No free air. Musculoskeletal: No acute or significant osseous findings. IMPRESSION: 1. Large amount of relatively high density peritoneal fluid with clot in the cul-de-sac suggesting hemorrhage into ascites. No evidence of active extravasation. 2. Hepatic cirrhosis. No additional stigmata of portal venous hypertension. Electronically Signed   By: Lucrezia Europe M.D.   On: 11/11/2016 20:11   Dg Abd Acute W/chest  Result Date: 11/13/2016 CLINICAL DATA:  Acute onset of generalized abdominal pain and bloating. Initial encounter. EXAM: DG ABDOMEN ACUTE W/ 1V CHEST COMPARISON:  CT of the abdomen and pelvis performed 11/11/2016 FINDINGS: The lungs are hypoexpanded, with mild right basilar atelectasis. There is no evidence of pleural effusion or pneumothorax. The cardiomediastinal silhouette is mildly enlarged. A pacemaker is noted overlying the left chest wall, with leads ending overlying the right atrium and right ventricle. The visualized bowel gas pattern is unremarkable. Scattered stool and air are seen within the colon; there is no evidence of small bowel dilatation to suggest obstruction. No free intra-abdominal air is identified on the provided upright view. No acute osseous abnormalities are seen; the sacroiliac joints are unremarkable in appearance. IMPRESSION: 1. Unremarkable bowel gas pattern; no free intra-abdominal air seen. Small amount of stool noted in the colon. 2. Lungs hypoexpanded, with mild right basilar atelectasis. 3. Mild cardiomegaly. Electronically Signed   By: Garald Balding M.D.   On: 11/13/2016 02:04        Scheduled Meds: . aspirin  325 mg Oral Daily  . cefTRIAXone (ROCEPHIN)  IV  2 g Intravenous Q24H  . fentaNYL (SUBLIMAZE) injection  12.5 mcg Intravenous Once  . insulin aspart  0-9 Units Subcutaneous TID WC  . levothyroxine  50 mcg Oral QAC breakfast  . metoprolol succinate  25  mg Oral Daily  . modafinil  200 mg Oral Daily  . multivitamin with minerals  1 tablet Oral Daily  . spironolactone  50 mg Oral Daily  . ursodiol  500 mg Oral BID   Continuous Infusions: . sodium chloride 100 mL/hr at 11/12/16 2031     LOS: 2 days    Time spent: 30 minutes    Loretha Stapler, MD [facility] Pager [phone]  If 7PM-7AM, please contact night-coverage [email or web address] Password [facility] 11/13/2016, 6:16 PM

## 2016-11-14 LAB — CBC
HCT: 32.4 % — ABNORMAL LOW (ref 39.0–52.0)
HEMOGLOBIN: 10.5 g/dL — AB (ref 13.0–17.0)
MCH: 32 pg (ref 26.0–34.0)
MCHC: 32.4 g/dL (ref 30.0–36.0)
MCV: 98.8 fL (ref 78.0–100.0)
Platelets: 379 10*3/uL (ref 150–400)
RBC: 3.28 MIL/uL — AB (ref 4.22–5.81)
RDW: 16.8 % — ABNORMAL HIGH (ref 11.5–15.5)
WBC: 11.4 10*3/uL — ABNORMAL HIGH (ref 4.0–10.5)

## 2016-11-14 LAB — GLUCOSE, CAPILLARY
GLUCOSE-CAPILLARY: 163 mg/dL — AB (ref 65–99)
Glucose-Capillary: 122 mg/dL — ABNORMAL HIGH (ref 65–99)
Glucose-Capillary: 245 mg/dL — ABNORMAL HIGH (ref 65–99)

## 2016-11-14 LAB — BASIC METABOLIC PANEL
ANION GAP: 6 (ref 5–15)
BUN: 20 mg/dL (ref 6–20)
CHLORIDE: 107 mmol/L (ref 101–111)
CO2: 25 mmol/L (ref 22–32)
Calcium: 8.3 mg/dL — ABNORMAL LOW (ref 8.9–10.3)
Creatinine, Ser: 0.84 mg/dL (ref 0.61–1.24)
GFR calc non Af Amer: >60 mL/min (ref >60)
Glucose, Bld: 174 mg/dL — ABNORMAL HIGH (ref 65–99)
POTASSIUM: 4.6 mmol/L (ref 3.5–5.1)
Sodium: 138 mmol/L (ref 135–145)

## 2016-11-14 MED ORDER — LACTULOSE 10 GM/15ML PO SOLN: ORAL | 11 refills | Status: DC

## 2016-11-14 MED ORDER — HUMALOG MIX 75/25 KWIKPEN (75-25) 100 UNIT/ML ~~LOC~~ SUPN: 8.0000 [IU] | PEN_INJECTOR | Freq: Two times a day (BID) | SUBCUTANEOUS | 0 refills | Status: DC

## 2016-11-14 MED ORDER — CEFUROXIME AXETIL 500 MG PO TABS: 500.0000 mg | ORAL_TABLET | Freq: Two times a day (BID) | ORAL | 0 refills | Status: AC

## 2016-11-14 NOTE — Progress Notes (Signed)
Patient discharged home with wife.  Reviewed DC instructions and medications with patients wife as she manages patients care and medications at home.  IV removed - WNL.  Instructed to follow up with PCP and GI.  Verbalizes understanding.  No questions at this time.  Patient in NAD.

## 2016-11-14 NOTE — Progress Notes (Signed)
HH order faxed to [place].

## 2016-11-14 NOTE — Progress Notes (Signed)
Patient wife states BiPAP is 16/0 which doesn't  appear correct. BiPAP set to auto titrate to 18 IIPAP starting  at 4 EPAP. With 4 lpm oxygen bled in.

## 2016-11-14 NOTE — Discharge Summary (Addendum)
Physician Discharge Summary  [name] [id number] DOB: [birth year] DOA: 11/10/2016  PCP: Petra Kuba, MD  Admit date: 11/10/2016 Discharge date: 11/14/2016  Admitted From: Home Disposition:  Home  Recommendations for Outpatient Follow-up:  1. Follow up with PCP in 1-2 weeks 2. Call Dr. Roseanne Kaufman office and schedule follow up within 1-2 weeks  3. Increase lactulose to obtain 2-3 semi formed stools daily 4. Please obtain BMP/CBC in one week 5. Take medications as prescribed   Home Health: Yes- PT and Aide Equipment/Devices: None  Discharge Condition: Stable CODE STATUS: Full code Diet recommendation: Heart Healthy / Carb Modified  Brief/Interim Summary: Nicholas Hardeman Robertsonis a 56 y.o.malewith medical history significant of NASH cirrhosis of the liver, AS, deafness uses aids, COPD, CRF on 3 liters day 5 liters qhs is getting paracentesis every couple weeks LVP last one yesterday. He went to see his PCP on Tuesday and his sbp was in the 44s. Wife and patient no med changes were made, he has continued to take his ARB and bblocker. He has been more weak and getting dizzy. Today his abdominal pain was worse than normal. It is generalized. He denies any fevers. No urinary symptoms. No sob. No chest pain. No melena, no brbpr and no vomiting or nausea. Dr Eulis Foster in the ED did a tap in the ED and reports it was bloody, clotted before any labs could be done (only culture being done). Pt found to be in AKI with drop in his hgb about 3 gms in a day. No overt bleeding from GI system. Referred for close monitoring of his hgb.  Patient was monitored and his hemoglobin was checked repeatedly.  His blood counts remained stable.  His AKI improved with holding of his ARB.  He was continued on antibiotics throughout his hospitalization and was transitioned to PO ceftin for discharge.  He was instructed to follow up with Dr. Gala Romney within 1-2 weeks after discharge. He was seen by PT prior  to discharge and was ordered home health PT to advance to outpatient PT.    Discharge Diagnoses:  Principal Problem:   Abdominal pain Active Problems:   Type 2 diabetes mellitus (HCC)   Deafness congenital   Cardiac pacemaker in situ   Aortic stenosis   Hepatic cirrhosis (HCC)   Chronic diastolic heart failure (HCC)   Atrial fibrillation (HCC)   Ascites   Hypothyroidism   Anemia   Lactic acidosis   AKI (acute kidney injury) (HCC)   Hyperkalemia  Abdominal pain 2/2 complications from paracentesis - likely due to traumatic tap but cannot rule out underlying SBP - Continue iv rocephin until cx back.  - CT of abdomen showed no free air but Large amount of relatively high density peritoneal fluid with clot in the cul-de-sac suggesting hemorrhage into ascites. No evidence of active extravasation. - fentanyl for pain  Ascites- noted - GI consulted - appreciate GI recommendations  Hepatic cirrhosis ([place]) - requiring LVP frequently - concern for traumatic tap done 4 days ago - GI consulted - TIPS consideration moving forward per GI note - management per GI  Hypotension  - not septic - taking his bp meds despite low bps as outpatient which explains his AKI - can continue metoprolol and spironolactone - procalitonin level elevated at 0.88  Anemia 2/2 blood loss s/p traumatic paracentesis - stable    AKI (acute kidney injury) ([place]) - Cr WNL  Hyperkalemia - WNL  Type 2 diabetes mellitus ([place]) - ssi  Deafness congenital- noted  Cardiac pacemaker in situ- noted  Aortic stenosis- noted  Chronic diastolic heart failure ([place])- currently appears euvolemic  Atrial fibrillation ([place])- rate controlled at this time  Hypothyroidism- stable  Discharge Instructions  Discharge Instructions    Call MD for:  difficulty breathing, headache or visual disturbances    Complete by:  As directed    Call MD for:  extreme fatigue    Complete by:  As directed    Call  MD for:  hives    Complete by:  As directed    Call MD for:  persistant dizziness or light-headedness    Complete by:  As directed    Call MD for:  persistant nausea and vomiting    Complete by:  As directed    Call MD for:  severe uncontrolled pain    Complete by:  As directed    Call MD for:  temperature >100.4    Complete by:  As directed    Diet - low sodium heart healthy    Complete by:  As directed    Diet Carb Modified    Complete by:  As directed    Discharge instructions    Complete by:  As directed    Take Lactulose as instructed Please call Dr. Gala Romney and schedule a follow up appointment within 1-2 weeks Get antibiotics at your pharmacy tomorrow   Increase activity slowly    Complete by:  As directed      Allergies as of 11/14/2016      Reactions   Ciprofloxacin    Itching and red rash up arm when infusing after 3rd cipro dose for uti on 10/27/13   Eliquis [apixaban] Other (See Comments)   bleeding   Enalapril Other (See Comments)   Cough   Metformin And Related    Severe diaarhea   Penicillins Other (See Comments)   Reaction unspecified/childhood allergy Has patient had a PCN reaction causing immediate rash, facial/tongue/throat swelling, SOB or lightheadedness with hypotension: Yes Has patient had a PCN reaction causing severe rash involving mucus membranes or skin necrosis: No Has patient had a PCN reaction that required hospitalization No Has patient had a PCN reaction occurring within the last 10 years: No If all of the above answers are "NO", then may proceed with Cephalosporin use.      Medication List    STOP taking these medications   losartan 25 MG tablet Commonly known as:  COZAAR     TAKE these medications   albuterol (2.5 MG/3ML) 0.083% nebulizer solution Commonly known as:  PROVENTIL Take 2.5 mg by nebulization every 6 (six) hours as needed for wheezing or shortness of breath.   albuterol 108 (90 Base) MCG/ACT inhaler Commonly known as:   PROVENTIL HFA;VENTOLIN HFA Inhale 2 puffs into the lungs every 6 (six) hours as needed for wheezing or shortness of breath.   aspirin 325 MG EC tablet Take 325 mg by mouth daily.   cefUROXime 500 MG tablet Commonly known as:  CEFTIN Take 1 tablet (500 mg total) by mouth 2 (two) times daily with a meal. Start taking on:  11/15/2016   CENTRUM tablet Take 1 tablet by mouth daily.   cetirizine 10 MG tablet Commonly known as:  ZYRTEC Take 10 mg by mouth daily.   Fish Oil 1000 MG Caps Take 1 capsule by mouth every other day.   gentamicin ointment 0.1 % Commonly known as:  GARAMYCIN Apply 1 application topically at bedtime. Inside nose   GLUCOSAMINE CHONDR 1500  COMPLX PO Take 1 tablet by mouth every morning.   HUMALOG MIX 75/25 KWIKPEN (75-25) 100 UNIT/ML Kwikpen Generic drug:  Insulin Lispro Prot & Lispro Inject 8 Units into the skin 2 (two) times daily. 15 units in the morning and 15 units in the evening What changed:  how much to take   lactulose 10 GM/15ML solution Commonly known as:  CHRONULAC 10cc (2 teaspoons) bid; Titrate to 2-3 semi formed stools a day What changed:  additional instructions   LEVOXYL 50 MCG tablet Generic drug:  levothyroxine Take 50 mcg by mouth daily.   metoprolol succinate 25 MG 24 hr tablet Commonly known as:  TOPROL-XL Take 25 mg by mouth daily.   modafinil 200 MG tablet Commonly known as:  PROVIGIL Take 200 mg by mouth daily.   mometasone 50 MCG/ACT nasal spray Commonly known as:  NASONEX Place 2 sprays into the nose daily as needed (FOR CONGESTION/ALLERGIES).   nitroGLYCERIN 0.4 MG SL tablet Commonly known as:  NITROSTAT Place 1 tablet (0.4 mg total) under the tongue every 5 (five) minutes as needed for chest pain.   NON FORMULARY Bipap 16.0/e   omeprazole 40 MG capsule Commonly known as:  PRILOSEC TAKE 1 CAPSULE BY MOUTH ONCE DAILY FOR REFLUX.   spironolactone 50 MG tablet Commonly known as:  ALDACTONE TAKE 1 TABLET BY MOUTH  ONCE DAILY.   ursodiol 500 MG tablet Commonly known as:  ACTIGALL TAKE 1 TABLET BY MOUTH TWICE DAILY      Follow-up Information    Petra Kuba, MD. Schedule an appointment as soon as possible for a visit in 2 week(s).   Specialty:  Family Medicine Contact information: [place] [address] [phone]        Manus Rudd, MD. Schedule an appointment as soon as possible for a visit in 1 week(s).   Specialty:  Gastroenterology Why:  Call office on Monday, 11/15/16 and schedule an appointment Contact information: [address] [place] [address] [phone]          Allergies  Allergen Reactions  . Ciprofloxacin     Itching and red rash up arm when infusing after 3rd cipro dose for uti on 10/27/13  . Eliquis [Apixaban] Other (See Comments)    bleeding  . Enalapril Other (See Comments)    Cough  . Metformin And Related     Severe diaarhea  . Penicillins Other (See Comments)    Reaction unspecified/childhood allergy Has patient had a PCN reaction causing immediate rash, facial/tongue/throat swelling, SOB or lightheadedness with hypotension: Yes Has patient had a PCN reaction causing severe rash involving mucus membranes or skin necrosis: No Has patient had a PCN reaction that required hospitalization No Has patient had a PCN reaction occurring within the last 10 years: No If all of the above answers are "NO", then may proceed with Cephalosporin use.    Consultations:  Gastroenterology  PT  CM   Procedures/Studies: Ct Abdomen Pelvis W Contrast  Result Date: 11/11/2016 CLINICAL DATA:  cirrhosis who has been requiring increasing frequency of paracentesis. Last paracentesis 2 days ago in the day after he was very fatigued, did not get out of bed, early satiety, worsening abdominal pain. His abdominal pain became worse the day he presented to the ER. His abdomen is distended and tympanic on exam. In the ER 10 mL of fluid were drawn off the  fluid pocket in his abdominal cavity with cell count unable to be completed due to the fluid clotting  as it was serosanguineous. Fluid culture is pending. His lactic acid was elevated. His platelet count is typically normal. His hemoglobin has trended down from 15.4-12.1 in the emergency room. It is declined further from 12.1-10.0 over 6 hours through 4:00 in the morning this morning. He has been receiving hydration at that time EXAM: CT ABDOMEN AND PELVIS WITH CONTRAST TECHNIQUE: Multidetector CT imaging of the abdomen and pelvis was performed using the standard protocol following bolus administration of intravenous contrast. CONTRAST:  153mL ISOVUE-300 IOPAMIDOL (ISOVUE-300) INJECTION 61% COMPARISON:  Ultrasound 10/27/2016, CT 07/15/2016 FINDINGS: Lower chest: Transvenous pacing leads partially visualized. Visualized lung bases clear. No pleural or pericardial effusion. Aortic valve calcifications. Hepatobiliary: Nodular liver contour. No focal lesion or biliary ductal dilatation. The gallbladder is nondistended. Pancreas: Unremarkable. No pancreatic ductal dilatation or surrounding inflammatory changes. Spleen: Normal in size without focal abnormality. Adrenals/Urinary Tract: Normal adrenal glands. Focal areas of parenchymal loss in the right kidney. No renal mass, urolithiasis, or hydronephrosis. Urinary bladder is nondistended. Stomach/Bowel: Stomach is incompletely distended, unremarkable. Small bowel is nondilated. There is good distal passage of the oral contrast material. Normal appendix. The colon is nondilated. Vascular/Lymphatic: Portal vein patent. No enlarged venous collateral channels evident. Scattered aortic plaque without aneurysm. No adenopathy localized. Reproductive: Prostate is unremarkable. Other: There is a large amount of relatively high attenuation abdominal ascites with some hyperdense clot in the cul-de-sac. No active extravasation. No abnormal peritoneal enhancement. No free air.  Musculoskeletal: No acute or significant osseous findings. IMPRESSION: 1. Large amount of relatively high density peritoneal fluid with clot in the cul-de-sac suggesting hemorrhage into ascites. No evidence of active extravasation. 2. Hepatic cirrhosis. No additional stigmata of portal venous hypertension. Electronically Signed   By: Lucrezia Europe M.D.   On: 11/11/2016 20:11   [name] Art/ven Flow Abd Pelv Doppler Limited  Result Date: 10/28/2016 CLINICAL DATA:  Cirrhosis and ascites.  Pain. EXAM: DUPLEX ULTRASOUND OF LIVER TECHNIQUE: Color and duplex Doppler ultrasound was performed to evaluate the hepatic in-flow and out-flow vessels. COMPARISON:  CT 07/16/2016 FINDINGS: Portal Vein Velocities Main:  24 cm/sec Right:  32 cm/sec Left:  15 cm/sec Hepatic Vein Velocities Right:  31 cm/sec Middle:  20 cm/sec Left:  34 cm/sec Hepatic Artery Velocity:  35 cm/sec Varices: Absent Ascites: Present Normal hepatofugal flow in the hepatic veins. Normal hepatopetal flow in the portal venous system. No evidence for portal vein thrombosis. IMPRESSION: Patent portal venous system with normal direction of flow. Large volume ascites. Electronically Signed   By: Markus Daft M.D.   On: 10/28/2016 11:03   US Paracentesis  Result Date: 11/09/2016 INDICATION: 56 year old male with history of ascites.  Cirrhosis. EXAM: ULTRASOUND GUIDED  PARACENTESIS MEDICATIONS: None. COMPLICATIONS: None immediate. PROCEDURE: Informed written consent was obtained from the patient after a discussion of the risks, benefits and alternatives to treatment. A timeout was performed prior to the initiation of the procedure. Initial ultrasound scanning demonstrates a large amount of ascites within the right lower abdominal quadrant. The right lower abdomen was prepped and draped in the usual sterile fashion. 1% lidocaine with epinephrine was used for local anesthesia. Following this, a 19 gauge, 7-cm, Yueh catheter was introduced. An ultrasound image was saved for  documentation purposes. The paracentesis was performed. The catheter was removed and a dressing was applied. The patient tolerated the procedure well without immediate post procedural complication. FINDINGS: A total of approximately 4 L of serous fluid was removed. IMPRESSION: Successful ultrasound-guided paracentesis yielding 4 liters of peritoneal fluid.  Electronically Signed   By: Vinnie Langton M.D.   On: 11/09/2016 14:42   US Paracentesis  Result Date: 10/27/2016 INDICATION: Cirrhosis, ascites EXAM: ULTRASOUND GUIDED THERAPEUTIC PARACENTESIS MEDICATIONS: None. COMPLICATIONS: None immediate. PROCEDURE: Procedure, benefits, and risks of procedure were discussed with patient. Written informed consent for procedure was obtained. Time out protocol followed. Adequate collection of ascites localized by ultrasound in LEFT lower quadrant. Skin prepped and draped in usual sterile fashion. Skin and soft tissues anesthetized with 10 mL of 1% lidocaine. 5 [place] Yueh catheter placed into peritoneal cavity. 4 L of yellow ascitic fluid aspirated by vacuum bottle suction. Procedure tolerated well by patient without immediate complication. FINDINGS: As above IMPRESSION: Successful ultrasound-guided paracentesis yielding 4 liters of peritoneal fluid. Electronically Signed   By: Lavonia Dana M.D.   On: 10/27/2016 17:12   US Paracentesis  Result Date: 10/22/2016 INDICATION: Cirrhosis, ascites EXAM: ULTRASOUND GUIDED THERAPEUTIC PARACENTESIS MEDICATIONS: None. COMPLICATIONS: None immediate. PROCEDURE: Procedure, benefits, and risks of procedure were discussed with patient. Written informed consent for procedure was obtained. Time out protocol followed. Adequate collection of ascites localized by ultrasound in RIGHT lower quadrant. Skin prepped and draped in usual sterile fashion. Skin and soft tissues anesthetized with 10 mL of 1% lidocaine. 5 [place] Yueh catheter placed into peritoneal cavity. 7 L of yellow fluid aspirated  by vacuum bottle suction. Procedure tolerated well by patient without immediate complication. FINDINGS: As above IMPRESSION: Successful ultrasound-guided paracentesis yielding 7 liters of peritoneal fluid. Electronically Signed   By: Lavonia Dana M.D.   On: 10/22/2016 15:20   Dg Abd Acute W/chest  Result Date: 11/13/2016 CLINICAL DATA:  Acute onset of generalized abdominal pain and bloating. Initial encounter. EXAM: DG ABDOMEN ACUTE W/ 1V CHEST COMPARISON:  CT of the abdomen and pelvis performed 11/11/2016 FINDINGS: The lungs are hypoexpanded, with mild right basilar atelectasis. There is no evidence of pleural effusion or pneumothorax. The cardiomediastinal silhouette is mildly enlarged. A pacemaker is noted overlying the left chest wall, with leads ending overlying the right atrium and right ventricle. The visualized bowel gas pattern is unremarkable. Scattered stool and air are seen within the colon; there is no evidence of small bowel dilatation to suggest obstruction. No free intra-abdominal air is identified on the provided upright view. No acute osseous abnormalities are seen; the sacroiliac joints are unremarkable in appearance. IMPRESSION: 1. Unremarkable bowel gas pattern; no free intra-abdominal air seen. Small amount of stool noted in the colon. 2. Lungs hypoexpanded, with mild right basilar atelectasis. 3. Mild cardiomegaly. Electronically Signed   By: Garald Balding M.D.   On: 11/13/2016 02:04   US Abdomen Limited Ruq  Result Date: 10/27/2016 CLINICAL DATA:  Hepatic cirrhosis, ascites. EXAM: US ABDOMEN LIMITED - RIGHT UPPER QUADRANT COMPARISON:  None. FINDINGS: Gallbladder: Gallbladder is contracted. No definite gallstones are noted. Mild gallbladder wall thickening is noted most likely due to adjacent hepatocellular disease or surrounding ascites. No sonographic Murphy's sign is noted. Common bile duct: Diameter: 3 mm which is within normal limits. Liver: No focal lesion identified. Nodular  hepatic contours are noted with heterogeneous echotexture consistent with hepatic cirrhosis. IMPRESSION: No cholelithiasis is noted. Findings consistent with hepatic cirrhosis. Electronically Signed   By: Marijo Conception, M.D.   On: 10/27/2016 19:41      Subjective: Patient in good spirits this morning.  Reports that he feels well and would like to go home.  Wife is asking for him to stay until tomorrow as their pharmacy is closed and  she is concerned about getting prescriptions.    Discharge Exam: Vitals:   11/14/16 0500 11/14/16 1506  BP: 140/75 99/76  Pulse:  92  Resp: 20 18  Temp: 98.5 F (36.9 C) 98.2 F (36.8 C)   Vitals:   11/14/16 0145 11/14/16 0500 11/14/16 0738 11/14/16 1506  BP:  140/75  99/76  Pulse: 93   92  Resp: 20 20  18   Temp:  98.5 F (36.9 C)  98.2 F (36.8 C)  TempSrc:  Oral  Oral  SpO2: 92% 96% 93% 96%  Weight:      Height:        General: Pt is alert, awake, not in acute distress Cardiovascular: RRR, S1/S2 +, no rubs, no gallops Respiratory: CTA bilaterally, no wheezing, no rhonchi Abdominal: Soft, NT, distended, bowel sounds + Extremities: no edema, no cyanosis, venous stasis changes of lower extremities bilaterally    The results of significant diagnostics from this hospitalization (including imaging, microbiology, ancillary and laboratory) are listed below for reference.     Microbiology: Recent Results (from the past 240 hour(s))  Culture, blood (routine x 2)     Status: None (Preliminary result)   Collection Time: 11/10/16 10:18 PM  Result Value Ref Range Status   Specimen Description LEFT ANTECUBITAL  Final   Special Requests BOTTLES DRAWN AEROBIC AND ANAEROBIC 6CC  Final   Culture NO GROWTH 3 DAYS  Final   Report Status PENDING  Incomplete  Culture, blood (routine x 2)     Status: None (Preliminary result)   Collection Time: 11/10/16 10:29 PM  Result Value Ref Range Status   Specimen Description LEFT ANTECUBITAL  Final   Special  Requests BOTTLES DRAWN AEROBIC AND ANAEROBIC 6CC  Final   Culture NO GROWTH 3 DAYS  Final   Report Status PENDING  Incomplete  Culture, body fluid-bottle     Status: None (Preliminary result)   Collection Time: 11/10/16 11:30 PM  Result Value Ref Range Status   Specimen Description SYNOVIAL  Final   Special Requests BOTTLES DRAWN AEROBIC ONLY 6CC DRAWN BY RN  Final   Culture NO GROWTH 2 DAYS  Final   Report Status PENDING  Incomplete     Labs: BNP (last 3 results) No results for input(s): BNP in the last 8760 hours. Basic Metabolic Panel:  Recent Labs Lab 11/10/16 2042 11/11/16 0412 11/12/16 0508 11/14/16 1459  NA 135 134* 134* 138  K 5.4* 4.3 4.2 4.6  CL 99* 99* 101 107  CO2 26 27 25 25   GLUCOSE 162* 217* 106* 174*  BUN 34* 34* 25* 20  CREATININE 1.68* 1.36* 0.92 0.84  CALCIUM 8.6* 7.9* 7.9* 8.3*   Liver Function Tests:  Recent Labs Lab 11/10/16 2042 11/11/16 0412  AST 106* 81*  ALT 91* 75*  ALKPHOS 368* 289*  BILITOT 1.1 0.8  PROT 5.7* 4.7*  ALBUMIN 2.7* 2.2*    Recent Labs Lab 11/10/16 2042  LIPASE 13    Recent Labs Lab 11/11/16 1532  AMMONIA 17   CBC:  Recent Labs Lab 11/09/16 1306 11/10/16 2042 11/11/16 0412 11/11/16 1536 11/12/16 0508 11/14/16 1459  WBC 7.6 13.9* 9.8  --  9.9 11.4*  NEUTROABS  --   --   --   --  6.7  --   HGB 15.4 12.1* 10.0* 10.0* 10.0* 10.5*  HCT 47.4 36.8* 29.9* 30.0* 31.0* 32.4*  MCV 97.9 96.1 95.8  --  97.5 98.8  PLT 261 445* 297  --  309 379  Cardiac Enzymes: No results for input(s): CKTOTAL, CKMB, CKMBINDEX, TROPONINI in the last 168 hours. BNP: Invalid input(s): POCBNP CBG:  Recent Labs Lab 11/13/16 1124 11/13/16 1606 11/13/16 2125 11/14/16 0741 11/14/16 1128  GLUCAP 272* 220* 222* 122* 245*   D-Dimer No results for input(s): DDIMER in the last 72 hours. Hgb A1c No results for input(s): HGBA1C in the last 72 hours. Lipid Profile No results for input(s): CHOL, HDL, LDLCALC, TRIG, CHOLHDL,  LDLDIRECT in the last 72 hours. Thyroid function studies No results for input(s): TSH, T4TOTAL, T3FREE, THYROIDAB in the last 72 hours.  Invalid input(s): FREET3 Anemia work up No results for input(s): VITAMINB12, FOLATE, FERRITIN, TIBC, IRON, RETICCTPCT in the last 72 hours. Urinalysis    Component Value Date/Time   COLORURINE AMBER (A) 11/10/2016 1856   APPEARANCEUR CLOUDY (A) 11/10/2016 1856   LABSPEC 1.024 11/10/2016 1856   PHURINE 5.0 11/10/2016 1856   GLUCOSEU 50 (A) 11/10/2016 1856   HGBUR LARGE (A) 11/10/2016 1856   BILIRUBINUR SMALL (A) 11/10/2016 1856   KETONESUR NEGATIVE 11/10/2016 1856   PROTEINUR NEGATIVE 11/10/2016 1856   UROBILINOGEN 0.2 05/13/2015 1315   NITRITE NEGATIVE 11/10/2016 1856   LEUKOCYTESUR NEGATIVE 11/10/2016 1856   Sepsis Labs Invalid input(s): PROCALCITONIN,  WBC,  LACTICIDVEN Microbiology Recent Results (from the past 240 hour(s))  Culture, blood (routine x 2)     Status: None (Preliminary result)   Collection Time: 11/10/16 10:18 PM  Result Value Ref Range Status   Specimen Description LEFT ANTECUBITAL  Final   Special Requests BOTTLES DRAWN AEROBIC AND ANAEROBIC 6CC  Final   Culture NO GROWTH 3 DAYS  Final   Report Status PENDING  Incomplete  Culture, blood (routine x 2)     Status: None (Preliminary result)   Collection Time: 11/10/16 10:29 PM  Result Value Ref Range Status   Specimen Description LEFT ANTECUBITAL  Final   Special Requests BOTTLES DRAWN AEROBIC AND ANAEROBIC 6CC  Final   Culture NO GROWTH 3 DAYS  Final   Report Status PENDING  Incomplete  Culture, body fluid-bottle     Status: None (Preliminary result)   Collection Time: 11/10/16 11:30 PM  Result Value Ref Range Status   Specimen Description SYNOVIAL  Final   Special Requests BOTTLES DRAWN AEROBIC ONLY 6CC DRAWN BY RN  Final   Culture NO GROWTH 2 DAYS  Final   Report Status PENDING  Incomplete     Time coordinating discharge: 40 minutes  SIGNED:   Loretha Stapler, MD  [facility] 11/14/2016, 4:25 PM Pager [phone] If 7PM-7AM, please contact night-coverage [email or web address] Password [email or web address]

## 2016-11-14 NOTE — Evaluation (Addendum)
Physical Therapy Evaluation Patient Details Name: [name] MRN: [id number] DOB: May 05, 1961 Today's Date: 11/14/2016   History of Present Illness  [name] is a 56 y.o. male with medical history significant of NASH cirrhosis of the liver, AS, deafness uses aids, COPD, CRF on 3 liters day 5 liters qhs is getting paracentesis every couple weeks LVP last one yesterday.  He went to see his PCP on Tuesday and his sbp was in the 36s.  Wife and patient no med changes were made, he has continued to take his ARB and bblocker.  He has been more weak and getting dizzy.  Today his abdominal pain was worse than normal.  It is generalized.  He denies any fevers.  No urinary symptoms.  No sob.  No chest pain.  No melena, no brbpr and no vomiting or nausea.  Dr Eulis Foster in the ED did a tap in the ED and reports it was bloody, clotted before any labs could be done (only culture being done).  Pt found to be in AKI with drop in his hgb about 3 gms in a day.  No overt bleeding from GI system.  Referred for close monitoring of his hgb.   Clinical Impression  Patient received sitting at EOB, coloring and very pleasant; spouse present throughout evaluation today. Patient is able to perform all functional mobility with S-Mod(I) today, both using IV pole and then HHA, however does demonstrate mild unsteadiness as well as functional weakness and reduced functional activity tolerance. Spouse very talkative today and PT did provide cues for redirection for focus on PT evaluation. At this time recommend HHPT followed by OP PT after he completes HHPT course of care; do not feel that patient is in need of skilled PT services during this admission however.  Patient left sitting at edge of bed with lunch served, spouse present, bed alarm activated, and all needs otherwise met.     Follow Up Recommendations Home health PT;Outpatient PT;Other (comment) (HHPT then progress to OP PT )    Equipment Recommendations  None  recommended by PT    Recommendations for Other Services       Precautions / Restrictions Precautions Precautions: None Restrictions Weight Bearing Restrictions: No      Mobility  Bed Mobility               General bed mobility comments: DNT   Transfers Overall transfer level: Modified independent               General transfer comment: Mod (I) with HHA, also Mod (I) with IV pole   Ambulation/Gait Ambulation/Gait assistance: Supervision Ambulation Distance (Feet): 15 Feet   Gait Pattern/deviations: Step-to pattern;Decreased dorsiflexion - right;Decreased dorsiflexion - left;Trendelenburg;Trunk flexed;Narrow base of support     General Gait Details: able to ambulate forwards and backwards with IV Pole with no significant LOB   Stairs            Wheelchair Mobility    Modified Rankin (Stroke Patients Only)       Balance Overall balance assessment: History of Falls;No apparent balance deficits (not formally assessed)                                           Pertinent Vitals/Pain Pain Assessment: No/denies pain    Home Living Family/patient expects to be discharged to:: Private residence Living Arrangements: Spouse/significant other  Available Help at Discharge: Family Type of Home: House Home Access: Ramped entrance     Home Layout: One level Home Equipment: Cane - single point      Prior Function Level of Independence: Independent;Independent with assistive device(s)               Hand Dominance        Extremity/Trunk Assessment        Lower Extremity Assessment Lower Extremity Assessment: Generalized weakness    Cervical / Trunk Assessment Cervical / Trunk Assessment: Kyphotic  Communication   Communication: No difficulties  Cognition Arousal/Alertness: Awake/alert Behavior During Therapy: WFL for tasks assessed/performed Overall Cognitive Status: Within Functional Limits for tasks assessed                       General Comments General comments (skin integrity, edema, etc.): most recent falls appear to be related to poor safety awareness rather than physical impairment     Exercises     Assessment/Plan    PT Assessment All further PT needs can be met in the next venue of care  PT Problem List Decreased strength;Decreased safety awareness;Decreased coordination       PT Treatment Interventions      PT Goals (Current goals can be found in the Care Plan section)  Acute Rehab PT Goals Patient Stated Goal: to go home  PT Goal Formulation: With patient/family Time For Goal Achievement: 11/28/16 Potential to Achieve Goals: Good    Frequency     Barriers to discharge        Co-evaluation               End of Session Equipment Utilized During Treatment: Gait belt;Oxygen Activity Tolerance: Patient tolerated treatment well Patient left: in bed;with call bell/phone within reach;with family/visitor present   PT Visit Diagnosis: Muscle weakness (generalized) (M62.81);History of falling (Z91.81)    Functional Assessment Tool Used: AM-PAC 6 Clicks Basic Mobility;Clinical judgement Functional Limitation: Mobility: Walking and moving around Mobility: Walking and Moving Around Current Status (L2957): At least 20 percent but less than 40 percent impaired, limited or restricted Mobility: Walking and Moving Around Goal Status [phone]): At least 1 percent but less than 20 percent impaired, limited or restricted    Time: 1130-1200 PT Time Calculation (min) (ACUTE ONLY): 30 min   Charges:   PT Evaluation $PT Eval Low Complexity: 1 Procedure PT Treatments $Self Care/Home Management: 8-22   PT G Codes:   PT G-Codes **NOT FOR INPATIENT CLASS** Functional Assessment Tool Used: AM-PAC 6 Clicks Basic Mobility;Clinical judgement Functional Limitation: Mobility: Walking and moving around Mobility: Walking and Moving Around Current Status (Z7096): At least 20 percent  but less than 40 percent impaired, limited or restricted Mobility: Walking and Moving Around Goal Status [phone]): At least 1 percent but less than 20 percent impaired, limited or restricted     Deniece Ree PT, DPT [phone]

## 2016-11-15 LAB — CULTURE, BLOOD (ROUTINE X 2)
Culture: NO GROWTH
Culture: NO GROWTH

## 2016-11-16 LAB — CULTURE, BODY FLUID-BOTTLE

## 2016-11-16 LAB — CULTURE, BODY FLUID W GRAM STAIN -BOTTLE: Culture: NO GROWTH

## 2016-11-18 DIAGNOSIS — K7460 Unspecified cirrhosis of liver: Secondary | ICD-10-CM

## 2016-11-18 HISTORY — DX: Unspecified cirrhosis of liver: K74.60

## 2016-11-22 ENCOUNTER — Telehealth: Payer: Self-pay

## 2016-11-22 ENCOUNTER — Other Ambulatory Visit: Payer: Self-pay

## 2016-11-22 ENCOUNTER — Ambulatory Visit (HOSPITAL_COMMUNITY)
Admission: RE | Admit: 2016-11-22 | Discharge: 2016-11-22 | Disposition: A | Discharge status: Home / Self Care | Payer: Medicare Other | Source: Ambulatory Visit | Attending: Gastroenterology | Admitting: Gastroenterology

## 2016-11-22 ENCOUNTER — Encounter (HOSPITAL_COMMUNITY): Payer: Self-pay

## 2016-11-22 ENCOUNTER — Encounter: Payer: Self-pay | Admitting: Gastroenterology

## 2016-11-22 ENCOUNTER — Ambulatory Visit (INDEPENDENT_AMBULATORY_CARE_PROVIDER_SITE_OTHER): Payer: Medicare Other | Admitting: Gastroenterology

## 2016-11-22 VITALS — BP 117/82 | HR 105 | Temp 97.8°F | Ht 63.0 in | Wt 182.2 lb

## 2016-11-22 DIAGNOSIS — K7460 Unspecified cirrhosis of liver: Secondary | ICD-10-CM | POA: Diagnosis present

## 2016-11-22 DIAGNOSIS — R188 Other ascites: Principal | ICD-10-CM

## 2016-11-22 LAB — BODY FLUID CELL COUNT WITH DIFFERENTIAL
EOS FL: 0 %
Lymphs, Fluid: 16 %
Monocyte-Macrophage-Serous Fluid: 12 % — ABNORMAL LOW (ref 50–90)
Neutrophil Count, Fluid: 72 % — ABNORMAL HIGH (ref 0–25)
Total Nucleated Cell Count, Fluid: 345 cu mm (ref 0–1000)

## 2016-11-22 LAB — GRAM STAIN

## 2016-11-22 MED ORDER — ALBUMIN HUMAN 25 % IV SOLN
25.0000 g | Freq: Once | INTRAVENOUS | Status: AC
  Administered 2016-11-22: 25 g via INTRAVENOUS

## 2016-11-22 MED ORDER — FUROSEMIDE 20 MG PO TABS: 20.0000 mg | ORAL_TABLET | Freq: Every day | ORAL | 3 refills | Status: DC

## 2016-11-22 MED ORDER — ALBUMIN HUMAN 25 % IV SOLN
INTRAVENOUS | Status: AC
  Administered 2016-11-22: 25 g via INTRAVENOUS
  Filled 2016-11-22: qty 100

## 2016-11-22 NOTE — Progress Notes (Signed)
Paracentesis complete no signs of distress. 10L red colored ascites removed.

## 2016-11-22 NOTE — Progress Notes (Signed)
Referring Provider: Petra Kuba, MD Primary Care Physician:  Petra Kuba, MD Primary GI: Dr. Gala Romney    Chief Complaint  Patient presents with  . Cirrhosis    HPI:   [name] is a 56 y.o. male presenting today with a history of NASH cirrhosis, history of ETOH use, treated empirically for AMA negative PBC, and treated empirically for SBP while hospitalized February 2018. Paracentesis on 11/09/16 with removal of 4 liters serous fluid as outpatient. Presented to hospital 2/22 with abdominal pain and CT noted large amount of relatively high density peritoneal fluid with clot in the cul-de-sac suggesting hemorrhage into ascites. Symptoms felt secondary to  traumatic paracentesis. Completed oral antibiotics for prophylactic coverage of SBP, with negative fluid cultures. Needs to be evaluated for consideration of TIPS. Screening EGD due in Jan 2019. No cell count was ever obtained due to blood ascites and clotting before before analyzed.   Denies abdominal pain but feels tight. Obvious distension. Not taking lactulose much but bowels are "loose". Wife states "just a little bit".  Not sure how many bowel movements he is having a day. Since leaving the hospital, he has been noting further accumulation of fluid. Has home health.  Aldactone 50 mg per day. No lasix currently. Denies mental status changes. No fever.   Past Medical History:  Diagnosis Date  . Alcohol use   . Anemia   . Aortic stenosis    mild  . Atrial flutter ([place])   . Benign hypertrophy of prostate    with urinary retention; repaired hypospadia; transurethral resection of the prostate   . Cardiac conduction disorder    status post pacemaker implantation in 1995 generator replaced 2005  . Chest pain 2007, 2009   cardiac cath > normal coronary arterie; nl left ventricular function  . Congenital deafness   . COPD (chronic obstructive pulmonary disease) ([place])   . Diabetes mellitus    +Insulin  . Edema   .  GERD (gastroesophageal reflux disease)    status post multiple dilatations for stricture  . 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 HTN (hypertension)   . Hyperlipidemia    statin discontinued due to abn LFTs  . Mitral regurgitation    insignificant  . Obesity   . Obesity 4/08   BMI= 40  . Pelvic mass    identified in 2009, stable 2010  . Sleep apnea    BiPAP and continuous oxygen  . Syncope   . Thyroid disease     Past Surgical History:  Procedure Laterality Date  . A-V CARDIAC PACEMAKER INSERTION  1995  . COLONOSCOPY  2008  . COLONOSCOPY N/A 06/23/2015   [id number] diverticulosis  . ESOPHAGOGASTRODUODENOSCOPY N/A 06/23/2015   [id number] portal gastropathy  . ORCHIECTOMY  1990   bilateral; ?neoplasm  . PACEMAKER INSERTION  05/2004; 10/04/2013   MDT dual chamber pacemaker; gen change 10/04/2013 (MDT ADDRL1)  . PERMANENT PACEMAKER GENERATOR CHANGE N/A 10/04/2013   Procedure: PERMANENT PACEMAKER GENERATOR CHANGE;  Surgeon: Evans Lance, MD;  Location: [facility] CATH LAB;  Service: Cardiovascular;  Laterality: N/A;  . REPAIR HYPOSPADIAS W/ URETHROPLASTY    . TONSILLECTOMY AND ADENOIDECTOMY    . TRANSURETHRAL RESECTION OF PROSTATE     for BPH    Current Outpatient Prescriptions  Medication Sig Dispense Refill  . albuterol (PROVENTIL HFA;VENTOLIN HFA) 108 (90 Base) MCG/ACT inhaler Inhale 2 puffs into the lungs every 6 (six) hours  as needed for wheezing or shortness of breath.    Marland Kitchen albuterol (PROVENTIL) (2.5 MG/3ML) 0.083% nebulizer solution Take 2.5 mg by nebulization every 6 (six) hours as needed for wheezing or shortness of breath.    Marland Kitchen aspirin 325 MG EC tablet Take 325 mg by mouth daily.    . cetirizine (ZYRTEC) 10 MG tablet Take 10 mg by mouth daily.    Marland Kitchen gentamicin ointment (GARAMYCIN) 0.1 % Apply 1 application topically at bedtime. Inside nose     . Glucosamine-Chondroit-Vit C-Mn (GLUCOSAMINE CHONDR 1500 COMPLX PO) Take  1 tablet by mouth every morning.    Marland Kitchen HUMALOG MIX 75/25 KWIKPEN (75-25) 100 UNIT/ML Kwikpen Inject 8 Units into the skin 2 (two) times daily. 15 units in the morning and 15 units in the evening 10 mL 0  . lactulose (CHRONULAC) 10 GM/15ML solution 10cc (2 teaspoons) bid; Titrate to 2-3 semi formed stools a day 473 mL 11  . levothyroxine (LEVOXYL) 50 MCG tablet Take 50 mcg by mouth daily.      . metoprolol succinate (TOPROL-XL) 25 MG 24 hr tablet Take 25 mg by mouth daily.    . modafinil (PROVIGIL) 200 MG tablet Take 200 mg by mouth daily.     . mometasone (NASONEX) 50 MCG/ACT nasal spray Place 2 sprays into the nose daily as needed (FOR CONGESTION/ALLERGIES).    . Multiple Vitamins-Minerals (CENTRUM) tablet Take 1 tablet by mouth daily.      . nitroGLYCERIN (NITROSTAT) 0.4 MG SL tablet Place 1 tablet (0.4 mg total) under the tongue every 5 (five) minutes as needed for chest pain. 25 tablet 3  . NON FORMULARY Bipap 16.0/e    . Omega-3 Fatty Acids (FISH OIL) 1000 MG CAPS Take 1 capsule by mouth every other day.     Marland Kitchen omeprazole (PRILOSEC) 40 MG capsule TAKE 1 CAPSULE BY MOUTH ONCE DAILY FOR REFLUX. 30 capsule 3  . spironolactone (ALDACTONE) 50 MG tablet TAKE 1 TABLET BY MOUTH ONCE DAILY. 30 tablet 0  . ursodiol (ACTIGALL) 500 MG tablet TAKE 1 TABLET BY MOUTH TWICE DAILY 60 tablet 11   No current facility-administered medications for this visit.     Allergies as of 11/22/2016 - Review Complete 11/22/2016  Allergen Reaction Noted  . Ciprofloxacin  10/27/2013  . Eliquis [apixaban] Other (See Comments) 03/18/2014  . Enalapril Other (See Comments) 04/15/2011  . Metformin and related  07/30/2013  . Penicillins Other (See Comments)     Family History  Problem Relation Age of Onset  . COPD Mother   . Hypertension Mother   . Congestive Heart Failure Mother   . Pneumonia Father   . Heart disease Other   . Hypertension Other   . Colon cancer Neg Hx     Social History   Social History  .  Marital status: Married    Spouse name: N/A  . Number of children: N/A  . Years of education: N/A   Social History Main Topics  . Smoking status: Never Smoker  . Smokeless tobacco: Never Used     Comment: tobacco use- no   . Alcohol use No  . Drug use: No  . Sexual activity: Yes   Other Topics Concern  . None   Social History Narrative   Part time.     Review of Systems: As mentioned in HPI   Physical Exam: BP 117/82   Pulse (!) 105   Temp 97.8 F (36.6 C) (Oral)   Ht 5\' 3"  (1.6 m)  Wt 182 lb 3.2 oz (82.6 kg)   BMI 32.28 kg/m  General:   Alert and oriented. No distress noted. Appears chronically ill and older than stated age.  Head:  Normocephalic and atraumatic. Eyes:  Conjuctiva clear without scleral icterus. Mouth:  Oral mucosa pink and moist.  Abdomen:  +BS, Tense ascites. No TTP.  Msk:  Symmetrical without gross deformities. Normal posture. Extremities:  With 1+ edema  Neurologic:  Alert and  oriented x4 Psych:  Alert and cooperative. Normal mood and affect.  Lab Results  Component Value Date   CREATININE 0.84 11/14/2016   BUN 20 11/14/2016   NA 138 11/14/2016   K 4.6 11/14/2016   CL 107 11/14/2016   CO2 25 11/14/2016

## 2016-11-22 NOTE — Patient Instructions (Addendum)
Please have blood work done on Tuesday with [place]. I am going to try and have this arranged for you.  We have scheduled you for fluid removal. I will also order fluid studies at that time.   Start taking Lasix 20 mg each morning with aldactone 50 mg each day. Do not take it on the day that you have your fluid removed.   We have referred you for evaluation of a TIPS procedure.    Transjugular Intrahepatic Portosystemic Shunt Insertion Transjugular intrahepatic portosystemic shunt (TIPS) insertion is a procedure to restore blood flow through the liver. This procedure may be done when a blockage has caused problems with the normal flow of blood through the liver, resulting in increased pressure (hypertension) in the vein that carries blood to the liver (portal vein). Portal hypertension is usually caused by scar tissue from liver disease, such as cirrhosis. It can cause serious problems. A TIPS procedure may be done if portal hypertension or liver disease has caused problems such as:  Bleeding from the esophagus or stomach.  Buildup of fluid in the abdomen. In a TIPS procedure, a new pathway for blood flow is created through the liver. A small wire-mesh tube called a shunt or stent is placed inside the pathway to keep it open. The shunt is passed down the jugular vein in your neck. Then, the shunt is inserted between the portal vein and a vein on the other side of the liver (hepatic vein), which is a vein that blood flows through when it leaves the liver. The shunt connects these blood vessels and improves blood flow across the liver. Tell a health care provider about:  Any allergies you have.  All medicines you are taking, including vitamins, herbs, eye drops, creams, and over-the-counter medicines.  Any problems you or family members have had with anesthetic medicines.  Any blood disorders you have.  Any surgeries you have had.  Any medical conditions you have.  Whether you are  pregnant or may be pregnant. What are the risks? Generally, this is a safe procedure. However, problems may occur, including:  Allergic reactions to medicines or dyes.  Bleeding into the abdomen.  Irregular heartbeat (cardiac arrhythmia).  Decline in brain function (encephalopathy) in people with severe liver disease. This may be a problem after the TIPS has been inserted, and it may require treatment. People who have had encephalopathy may not be good candidates for TIPS.  Blockage of the shunt. This may lead to a return of the problems. If this occurs, a new shunt can be placed or the existing shunt can be adjusted.You will have ultrasound exams to evaluate for a blockage of the shunt. What happens before the procedure? Staying hydrated  Follow instructions from your health care provider about hydration, which may include:  Up to 2 hours before the procedure - you may continue to drink clear liquids, such as water, clear fruit juice, black coffee, and plain tea. Eating and drinking restrictions  Follow instructions from your health care provider about eating and drinking, which may include:  8 hours before the procedure - stop eating heavy meals or foods such as meat, fried foods, or fatty foods.  6 hours before the procedure - stop eating light meals or foods, such as toast or cereal.  6 hours before the procedure - stop drinking milk or drinks that contain milk.  2 hours before the procedure - stop drinking clear liquids. Medicines  Ask your health care provider about:  Changing  or stopping your regular medicines. This is especially important if you are taking diabetes medicines or blood thinners.  Taking medicines such as aspirin and ibuprofen. These medicines can thin your blood. Do not take these medicines before your procedure if your health care provider instructs you not to. General instructions   You may have blood tests to see how well your kidneys and liver are  working and to see how well your blood can clot.  Plan to have someone take you home from the hospital or clinic. What happens during the procedure?  To reduce your risk of infection:  Your health care team will wash or sanitize their hands.  Your skin will be washed with soap.  An IV tube will be inserted into one of your veins.  You will be given one or more of the following:  A medicine to help you relax (sedative).  A medicine to numb the neck area (local anesthetic).  A medicine to make you fall asleep (general anesthetic).  A needle will be used to make a small hole in a vein in the right side of your neck. Needles and long, thin, flexible tubes (catheters) will be moved through the hole down to the veins in your liver.  Dye will be injected through a catheter, and X-ray images will be taken. This will help your surgeon see the blood flow around your liver. As the dye passes through the blood vessels in your body, you may experience a warm feeling.  A needle will be inserted across the liver to make a connection with a branch of the portal vein. This channel will be expanded, and the shunt will be inserted and left in place.  The needle and catheter will be removed.  Pressure will be applied to your neck to prevent bleeding. A bandage (dressing) will be applied. The procedure may vary among health care providers and hospitals. What happens after the procedure?  Your blood pressure, heart rate, breathing rate, and blood oxygen level will be monitored until the medicines you were given have worn off.  Your health care provider will watch for signs of complications.  You will be instructed to keep your head raised (elevated) above the level of your heart for a few hours after the procedure. This will help to prevent bleeding from the insertion site in your neck.  You may feel mild stiffness in your neck where the needle was inserted.  An ultrasound may be done the morning  after the procedure to make sure that the shunt is open and working well.  Do not drive for 24 hours if you were given a sedative. This information is not intended to replace advice given to you by your health care provider. Make sure you discuss any questions you have with your health care provider. Document Released: 06/05/2003 Document Revised: 06/18/2016 Document Reviewed: 06/18/2016 Elsevier Interactive Patient Education  2017 [employer].

## 2016-11-22 NOTE — Telephone Encounter (Signed)
Ordered entered for IR eval & mgmt for TIPS evaluation. Called IR and was informed to fax info. Pt's info faxed to IR.

## 2016-11-23 NOTE — Assessment & Plan Note (Signed)
56 year old male with history of NASH cirrhosis, history of ETOH abuse, currently treated empirically for AMA negative PBC, recently hospitalized for abdominal pain that was empirically covered for SBP. Cell count was never obtained due to clotting of serosanguineous fluid. Cultures were negative for any growth. Completed oral antibiotics from discharge. Presents today with recurrent, tense ascites. Afebrile, no alarm symptoms. Discussed referral to have evaluation for TIPS. He would need further clinical assessment for candidacy. With recurrent ascites, discussed appropriate salt restriction and adding low dose lasix for now (20 mg) to take along with aldactone 50 mg daily that he is on. Will need to repeat BMP in 1 week (next Tuesday). US paracentesis scheduled with fluid analyses ordered. Further recommendations to follow.

## 2016-11-23 NOTE — Progress Notes (Signed)
Briefly discussed with Dr. Oneida Alar. Will monitor for now. Low threshold for antibiotics if he becomes symptomatic.

## 2016-11-23 NOTE — Progress Notes (Signed)
Cell count reviewed. Absolute PMN count is less than 250 (although elevated at 248). I do not believe this is a reliable sample due to bloody fluid. He was treated empirically for SBP during hospitalization, and we do not have anything to compare this cell count to as no cell count was able to be collected during hospitalization due to bloody ascites. Felt that he had a traumatic paracentesis previously that contributed to this during recent hospitalization. He has NO concerning signs/symptoms as of yesterday when seen in the office. Thus far, cultures with no growth. I highly doubt this is SBP and likely an unreliable cell count due to previous traumatic tap. Will discuss with attending but favor monitoring for now. If he were to develop abdominal pain, fever, mental status changes, he would need to seek immediate medical care, as per normal standard.

## 2016-11-24 NOTE — Progress Notes (Signed)
cc'ed to pcp °

## 2016-11-25 ENCOUNTER — Ambulatory Visit (INDEPENDENT_AMBULATORY_CARE_PROVIDER_SITE_OTHER): Payer: Medicare Other | Admitting: Internal Medicine

## 2016-11-25 ENCOUNTER — Encounter: Payer: Self-pay | Admitting: Internal Medicine

## 2016-11-25 VITALS — BP 118/78 | HR 98 | Ht 63.0 in | Wt 163.0 lb

## 2016-11-25 DIAGNOSIS — I495 Sick sinus syndrome: Secondary | ICD-10-CM | POA: Diagnosis not present

## 2016-11-25 LAB — CUP PACEART INCLINIC DEVICE CHECK
Battery Remaining Longevity: 144 mo
Brady Statistic AP VS Percent: 0 %
Brady Statistic AS VP Percent: 0 %
Brady Statistic AS VS Percent: 100 %
Date Time Interrogation Session: 20180308115619
Implantable Lead Implant Date: 20050923
Implantable Lead Location: 753860
Implantable Lead Model: 5024
Implantable Pulse Generator Implant Date: 20150115
Lead Channel Impedance Value: 277 Ohm
Lead Channel Impedance Value: 277 Ohm
Lead Channel Impedance Value: 365 Ohm
Lead Channel Impedance Value: 365 Ohm
Lead Channel Pacing Threshold Amplitude: 0.5 V
Lead Channel Pacing Threshold Amplitude: 0.625 V
Lead Channel Pacing Threshold Pulse Width: 0.4 ms
Lead Channel Pacing Threshold Pulse Width: 0.4 ms
Lead Channel Sensing Intrinsic Amplitude: 2 mV
Lead Channel Sensing Intrinsic Amplitude: 2 mV
Lead Channel Setting Sensing Sensitivity: 2 mV
MDC IDC LEAD IMPLANT DT: 20050923
MDC IDC LEAD LOCATION: 753859
MDC IDC MSMT BATTERY IMPEDANCE: 135 Ohm
MDC IDC MSMT BATTERY VOLTAGE: 2.79 V
MDC IDC MSMT LEADCHNL RV PACING THRESHOLD AMPLITUDE: 0.5 V
MDC IDC MSMT LEADCHNL RV PACING THRESHOLD PULSEWIDTH: 0.4 ms
MDC IDC MSMT LEADCHNL RV SENSING INTR AMPL: 5.6 mV
MDC IDC MSMT LEADCHNL RV SENSING INTR AMPL: 5.6 mV
MDC IDC SET LEADCHNL RA PACING AMPLITUDE: 2.5 V
MDC IDC SET LEADCHNL RV PACING AMPLITUDE: 2 V
MDC IDC SET LEADCHNL RV PACING PULSEWIDTH: 0.4 ms
MDC IDC STAT BRADY AP VP PERCENT: 0 %

## 2016-11-25 NOTE — Patient Instructions (Signed)
Your physician wants you to follow-up in: 1 Year with Dr. Lovena Le. You will receive a reminder letter in the mail two months in advance. If you don't receive a letter, please call our office to schedule the follow-up appointment.  Your physician recommends that you continue on your current medications as directed. Please refer to the Current Medication list given to you today.  Remote monitoring is used to monitor your Pacemaker of ICD from home. This monitoring reduces the number of office visits required to check your device to one time per year. It allows [name] to keep an eye on the functioning of your device to ensure it is working properly. You are scheduled for a device check from home on 02/24/17. You may send your transmission at any time that day. If you have a wireless device, the transmission will be sent automatically. After your physician reviews your transmission, you will receive a postcard with your next transmission date.  If you need a refill on your cardiac medications before your next appointment, please call your pharmacy.  Thank you for choosing [facility]!

## 2016-11-25 NOTE — Progress Notes (Signed)
HPI Mr. Razzano to returns today for followup of diastolic heart failure. He is a very pleasant 56 yo man with a h/o atrial fibrillation, symptomatic tachy-brady syndrome, status post pacemaker insertion, HTN, diastolic heart failure and syncope. He denies chest pain or sob. He has been diagnosed with cirrhosis and has had multiple large volume paracentesis. He is considering having a TIPS procedure performed. He is not encephalopathic today.   Allergies  Allergen Reactions  . Ciprofloxacin     Itching and red rash up arm when infusing after 3rd cipro dose for uti on 10/27/13  . Eliquis [Apixaban] Other (See Comments)    bleeding  . Enalapril Other (See Comments)    Cough  . Metformin And Related     Severe diaarhea  . Penicillins Other (See Comments)    Reaction unspecified/childhood allergy Has patient had a PCN reaction causing immediate rash, facial/tongue/throat swelling, SOB or lightheadedness with hypotension: Yes Has patient had a PCN reaction causing severe rash involving mucus membranes or skin necrosis: No Has patient had a PCN reaction that required hospitalization No Has patient had a PCN reaction occurring within the last 10 years: No If all of the above answers are "NO", then may proceed with Cephalosporin use.     Current Outpatient Prescriptions  Medication Sig Dispense Refill  . albuterol (PROVENTIL HFA;VENTOLIN HFA) 108 (90 Base) MCG/ACT inhaler Inhale 2 puffs into the lungs every 6 (six) hours as needed for wheezing or shortness of breath.    Marland Kitchen albuterol (PROVENTIL) (2.5 MG/3ML) 0.083% nebulizer solution Take 2.5 mg by nebulization every 6 (six) hours as needed for wheezing or shortness of breath.    Marland Kitchen aspirin 325 MG EC tablet Take 325 mg by mouth daily.    . cetirizine (ZYRTEC) 10 MG tablet Take 10 mg by mouth daily.    . furosemide (LASIX) 20 MG tablet Take 1 tablet (20 mg total) by mouth daily. Take along with spironolactone 90 tablet 3  . gentamicin  ointment (GARAMYCIN) 0.1 % Apply 1 application topically at bedtime. Inside nose     . Glucosamine-Chondroit-Vit C-Mn (GLUCOSAMINE CHONDR 1500 COMPLX PO) Take 1 tablet by mouth every morning.    Marland Kitchen HUMALOG MIX 75/25 KWIKPEN (75-25) 100 UNIT/ML Kwikpen Inject 8 Units into the skin 2 (two) times daily. 15 units in the morning and 15 units in the evening 10 mL 0  . lactulose (CHRONULAC) 10 GM/15ML solution 10cc (2 teaspoons) bid; Titrate to 2-3 semi formed stools a day 473 mL 11  . levothyroxine (LEVOXYL) 50 MCG tablet Take 50 mcg by mouth daily.      . metoprolol succinate (TOPROL-XL) 25 MG 24 hr tablet Take 25 mg by mouth daily.    . modafinil (PROVIGIL) 200 MG tablet Take 200 mg by mouth daily.     . mometasone (NASONEX) 50 MCG/ACT nasal spray Place 2 sprays into the nose daily as needed (FOR CONGESTION/ALLERGIES).    . Multiple Vitamins-Minerals (CENTRUM) tablet Take 1 tablet by mouth daily.      . nitroGLYCERIN (NITROSTAT) 0.4 MG SL tablet Place 1 tablet (0.4 mg total) under the tongue every 5 (five) minutes as needed for chest pain. 25 tablet 3  . NON FORMULARY Bipap 16.0/e    . Omega-3 Fatty Acids (FISH OIL) 1000 MG CAPS Take 1 capsule by mouth every other day.     Marland Kitchen omeprazole (PRILOSEC) 40 MG capsule TAKE 1 CAPSULE BY MOUTH ONCE DAILY FOR REFLUX. 30 capsule 3  .  spironolactone (ALDACTONE) 50 MG tablet TAKE 1 TABLET BY MOUTH ONCE DAILY. 30 tablet 0  . ursodiol (ACTIGALL) 500 MG tablet TAKE 1 TABLET BY MOUTH TWICE DAILY 60 tablet 11   No current facility-administered medications for this visit.      Past Medical History:  Diagnosis Date  . Alcohol use   . Anemia   . Aortic stenosis    mild  . Atrial flutter ([place])   . Benign hypertrophy of prostate    with urinary retention; repaired hypospadia; transurethral resection of the prostate   . Cardiac conduction disorder    status post pacemaker implantation in 1995 generator replaced 2005  . Chest pain 2007, 2009   cardiac cath > normal  coronary arterie; nl left ventricular function  . Congenital deafness   . COPD (chronic obstructive pulmonary disease) ([place])   . Diabetes mellitus    +Insulin  . Edema   . GERD (gastroesophageal reflux disease)    status post multiple dilatations for stricture  . 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 HTN (hypertension)   . Hyperlipidemia    statin discontinued due to abn LFTs  . Mitral regurgitation    insignificant  . Obesity   . Obesity 4/08   BMI= 40  . Pelvic mass    identified in 2009, stable 2010  . Sleep apnea    BiPAP and continuous oxygen  . Syncope   . Thyroid disease     ROS:   All systems reviewed and negative except as noted in the HPI.   Past Surgical History:  Procedure Laterality Date  . A-V CARDIAC PACEMAKER INSERTION  1995  . COLONOSCOPY  2008  . COLONOSCOPY N/A 06/23/2015   [id number] diverticulosis  . ESOPHAGOGASTRODUODENOSCOPY N/A 06/23/2015   [id number] portal gastropathy  . ORCHIECTOMY  1990   bilateral; ?neoplasm  . PACEMAKER INSERTION  05/2004; 10/04/2013   MDT dual chamber pacemaker; gen change 10/04/2013 (MDT ADDRL1)  . PERMANENT PACEMAKER GENERATOR CHANGE N/A 10/04/2013   Procedure: PERMANENT PACEMAKER GENERATOR CHANGE;  Surgeon: Evans Lance, MD;  Location: [facility] CATH LAB;  Service: Cardiovascular;  Laterality: N/A;  . REPAIR HYPOSPADIAS W/ URETHROPLASTY    . TONSILLECTOMY AND ADENOIDECTOMY    . TRANSURETHRAL RESECTION OF PROSTATE     for BPH     Family History  Problem Relation Age of Onset  . COPD Mother   . Hypertension Mother   . Congestive Heart Failure Mother   . Pneumonia Father   . Heart disease Other   . Hypertension Other   . Colon cancer Neg Hx      Social History   Social History  . Marital status: Married    Spouse name: N/A  . Number of children: N/A  . Years of education: N/A   Occupational History  . Not on file.   Social History Main Topics    . Smoking status: Never Smoker  . Smokeless tobacco: Never Used     Comment: tobacco use- no   . Alcohol use No  . Drug use: No  . Sexual activity: Yes   Other Topics Concern  . Not on file   Social History Narrative   Part time.      BP 118/78   Pulse 98   Ht 5\' 3"  (1.6 m)   Wt 163 lb (73.9 kg)   SpO2 90%   BMI 28.87 kg/m   Physical Exam:  Well  appearing middle aged man, NAD HEENT: Unremarkable Neck: 7 cm JVD, no thyromegally Back:  No CVA tenderness Lungs:  Clear with no wheezes, rales, or rhonchi HEART:  IRegular rate rhythm, no murmurs, no rubs, no clicks Abd:  soft, positive bowel sounds, no organomegally, no rebound, no guarding Ext:  2 plus pulses, no edema, no cyanosis, no clubbing Skin:  No rashes no nodules Neuro:  CN II through XII intact, motor grossly intact  PPM interogation - normal DDI PM function  Assess/Plan: 1. PAF - he is at risk for stroke but is not a candidate for watchman or for systemic anti-coagulation. His rate is well controlled 2. Symptomatic sinus node dysfunction - he is s/p PPM and is asymptomatic 3. Diastolic CHF - his symptoms are well controlled. Will follow. 4. Preoperative evaluation - he is pending a possible TIPS procedure. His cardiovascular risk is acceptable from this although the intraprocedural risk is not trivial.   Mikle Bosworth.D.

## 2016-11-28 LAB — CULTURE, BODY FLUID W GRAM STAIN -BOTTLE: Culture: NO GROWTH

## 2016-11-30 ENCOUNTER — Telehealth: Payer: Self-pay | Admitting: Internal Medicine

## 2016-11-30 ENCOUNTER — Other Ambulatory Visit: Payer: Self-pay | Admitting: *Deleted

## 2016-11-30 ENCOUNTER — Telehealth: Payer: Self-pay

## 2016-11-30 ENCOUNTER — Ambulatory Visit
Admission: RE | Admit: 2016-11-30 | Discharge: 2016-11-30 | Disposition: A | Discharge status: Home / Self Care | Payer: Medicare Other | Source: Ambulatory Visit | Attending: Gastroenterology | Admitting: Gastroenterology

## 2016-11-30 DIAGNOSIS — Z95828 Presence of other vascular implants and grafts: Secondary | ICD-10-CM

## 2016-11-30 DIAGNOSIS — R188 Other ascites: Principal | ICD-10-CM

## 2016-11-30 DIAGNOSIS — K7460 Unspecified cirrhosis of liver: Secondary | ICD-10-CM

## 2016-11-30 HISTORY — PX: IR RADIOLOGIST EVAL & MGMT: IMG5224

## 2016-11-30 LAB — COMPLETE METABOLIC PANEL WITH GFR
ALT: 58 U/L — AB (ref 9–46)
AST: 61 U/L — ABNORMAL HIGH (ref 10–35)
Albumin: 3 g/dL — ABNORMAL LOW (ref 3.6–5.1)
Alkaline Phosphatase: 535 U/L — ABNORMAL HIGH (ref 40–115)
BUN: 30 mg/dL — ABNORMAL HIGH (ref 7–25)
CHLORIDE: 99 mmol/L (ref 98–110)
CO2: 31 mmol/L (ref 20–31)
Calcium: 8.6 mg/dL (ref 8.6–10.3)
Creat: 1.03 mg/dL (ref 0.70–1.33)
GFR, EST NON AFRICAN AMERICAN: 81 mL/min (ref >=60)
Glucose, Bld: 152 mg/dL — ABNORMAL HIGH (ref 65–99)
POTASSIUM: 4.6 mmol/L (ref 3.5–5.3)
Sodium: 141 mmol/L (ref 135–146)
Total Bilirubin: 0.8 mg/dL (ref 0.2–1.2)
Total Protein: 5.3 g/dL — ABNORMAL LOW (ref 6.1–8.1)

## 2016-11-30 LAB — PROTIME-INR
INR: 1.1
PROTHROMBIN TIME: 11.4 s (ref 9.0–11.5)

## 2016-11-30 NOTE — Telephone Encounter (Signed)
Routing to AB.  

## 2016-11-30 NOTE — Telephone Encounter (Signed)
BUN slightly elevated at 29, Cr 0.98. Alk Phos elevated 563, higher than baseline. AST 78, ALT 70, around baseline.

## 2016-11-30 NOTE — Telephone Encounter (Signed)
Home Health nurse called to say that patient's BUN was at 29

## 2016-11-30 NOTE — Telephone Encounter (Signed)
This is a duplicate note. See other phone note.

## 2016-11-30 NOTE — Consult Note (Signed)
Chief Complaint: Recurrent symptomatic ascites   Referring Physician(s): Roseanne Kaufman W  History of Present Illness: [name] is a 56 y.o. 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 The patient is accompanied by his wife though serves as his own historian.  The patient has undergone several serial large volumes paracentesis initially on beginning in October of last year.  Most recent paracentesis was performed on 11/22/2016 yielding 10 L of peritoneal fluid.  The patient wife reports a solitary episode of potential altered mental status approximately 1 month ago. Otherwise, there is no complaint of encephalopathy. No yellowing of the skin or eyes. [name] activity level has been limited given his abdominal discomfort associated with the ascites however he is otherwise without complaint. No fever or chills. No nausea or vomiting. No hematemesis. No bloody or melanotic stools. No change in urinary function.  The patient ambulates in the community with the use of a walker. Patient is able to walk to his mailbox and back. No recent falls.    Past Medical History:  Diagnosis Date  . Alcohol use   . Anemia   . Aortic stenosis    mild  . Atrial flutter ([place])   . Benign hypertrophy of prostate    with urinary retention; repaired hypospadia; transurethral resection of the prostate   . Cardiac conduction disorder    status post pacemaker implantation in 1995 generator replaced 2005  . Chest pain 2007, 2009   cardiac cath > normal  coronary arterie; nl left ventricular function  . Congenital deafness   . COPD (chronic obstructive pulmonary disease) ([place])   . Diabetes mellitus    +Insulin  . Edema   . GERD (gastroesophageal reflux disease)    status post multiple dilatations for stricture  . 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 HTN (hypertension)   . Hyperlipidemia    statin discontinued due to abn LFTs  . Mitral regurgitation    insignificant  . Obesity   . Obesity 4/08   BMI= 40  . Pelvic mass    identified in 2009, stable 2010  . Sleep apnea    BiPAP and continuous oxygen  . Syncope   . Thyroid disease     Past Surgical History:  Procedure Laterality Date  . A-V CARDIAC PACEMAKER INSERTION  1995  . COLONOSCOPY  2008  . COLONOSCOPY N/A 06/23/2015   NID:[id number] diverticulosis  . ESOPHAGOGASTRODUODENOSCOPY N/A 06/23/2015   TIR:WERX portal gastropathy  . ORCHIECTOMY  1990   bilateral; ?neoplasm  . PACEMAKER INSERTION  05/2004; 10/04/2013   MDT dual chamber pacemaker; gen change 10/04/2013 (MDT ADDRL1)  . PERMANENT PACEMAKER GENERATOR CHANGE N/A 10/04/2013   Procedure: PERMANENT PACEMAKER GENERATOR CHANGE;  Surgeon: Evans Lance, MD;  Location: [facility] CATH LAB;  Service: Cardiovascular;  Laterality: N/A;  . REPAIR HYPOSPADIAS W/ URETHROPLASTY    . TONSILLECTOMY AND ADENOIDECTOMY    . TRANSURETHRAL RESECTION OF PROSTATE     for BPH    Allergies: Ciprofloxacin; Eliquis [apixaban]; Enalapril; Metformin and related; and Penicillins  Medications: Prior to Admission medications   Medication Sig Start  Date End Date Taking? Authorizing Provider  albuterol (PROVENTIL HFA;VENTOLIN HFA) 108 (90 Base) MCG/ACT inhaler Inhale 2 puffs into the lungs every 6 (six) hours as needed for wheezing or shortness of breath.   Yes Historical Provider, MD  albuterol (PROVENTIL) (2.5 MG/3ML) 0.083% nebulizer solution Take 2.5 mg by nebulization every 6 (six) hours  as needed for wheezing or shortness of breath.   Yes Historical Provider, MD  aspirin 325 MG EC tablet Take 325 mg by mouth daily.   Yes Historical Provider, MD  cetirizine (ZYRTEC) 10 MG tablet Take 10 mg by mouth daily.   Yes Historical Provider, MD  furosemide (LASIX) 20 MG tablet Take 1 tablet (20 mg total) by mouth daily. Take along with spironolactone 11/22/16  Yes Annitta Needs, NP  gentamicin ointment (GARAMYCIN) 0.1 % Apply 1 application topically at bedtime. Inside nose    Yes Historical Provider, MD  Glucosamine-Chondroit-Vit C-Mn (GLUCOSAMINE CHONDR 1500 COMPLX PO) Take 1 tablet by mouth every morning.   Yes Historical Provider, MD  HUMALOG MIX 75/25 KWIKPEN (75-25) 100 UNIT/ML Kwikpen Inject 8 Units into the skin 2 (two) times daily. 15 units in the morning and 15 units in the evening 11/14/16  Yes Eber Jones, MD  lactulose [facility]) 10 GM/15ML solution 10cc (2 teaspoons) bid; Titrate to 2-3 semi formed stools a day 11/14/16  Yes Eber Jones, MD  levothyroxine (LEVOXYL) 50 MCG tablet Take 50 mcg by mouth daily.     Yes Historical Provider, MD  metoprolol succinate (TOPROL-XL) 25 MG 24 hr tablet Take 25 mg by mouth daily.   Yes Historical Provider, MD  modafinil (PROVIGIL) 200 MG tablet Take 200 mg by mouth daily.  01/13/15  Yes Historical Provider, MD  mometasone (NASONEX) 50 MCG/ACT nasal spray Place 2 sprays into the nose daily as needed (FOR CONGESTION/ALLERGIES).   Yes Historical Provider, MD  Multiple Vitamins-Minerals (CENTRUM) tablet Take 1 tablet by mouth daily.     Yes Historical Provider, MD  nitroGLYCERIN (NITROSTAT) 0.4 MG SL tablet Place 1 tablet (0.4 mg total) under the tongue every 5 (five) minutes as needed for chest pain. 06/26/15  Yes Lendon Colonel, NP  NON FORMULARY Bipap 16.0/e   Yes Historical Provider, MD  Omega-3 Fatty Acids (FISH OIL) 1000 MG CAPS Take 1 capsule by mouth every other day.    Yes Historical Provider, MD  omeprazole (PRILOSEC) 40  MG capsule TAKE 1 CAPSULE BY MOUTH ONCE DAILY FOR REFLUX. 09/17/16  Yes Arnoldo Lenis, MD  spironolactone (ALDACTONE) 50 MG tablet TAKE 1 TABLET BY MOUTH ONCE DAILY. 10/19/16  Yes Arnoldo Lenis, MD  ursodiol (ACTIGALL) 500 MG tablet TAKE 1 TABLET BY MOUTH TWICE DAILY 08/11/16  Yes Carlis Stable, NP     Family History  Problem Relation Age of Onset  . COPD Mother   . Hypertension Mother   . Congestive Heart Failure Mother   . Pneumonia Father   . Heart disease Other   . Hypertension Other   . Colon cancer Neg Hx     Social History   Social History  . Marital status: Married    Spouse name: N/A  . Number of children: N/A  . Years of education: N/A   Social History Main Topics  . Smoking status: Never Smoker  . Smokeless tobacco: Never Used     Comment: tobacco use- no   . Alcohol use No  . Drug use: No  . Sexual activity: Yes   Other  Topics Concern  . Not on file   Social History Narrative   Part time.     ECOG Status: 2 - Symptomatic, <50% confined to bed  Review of Systems: A 12 point ROS discussed and pertinent positives are indicated in the HPI above.  All other systems are negative.  Review of Systems  Constitutional: Negative for appetite change and fever.  Eyes: Negative.   Respiratory: Negative.   Cardiovascular: Negative.   Gastrointestinal: Positive for abdominal distention. Negative for blood in stool and constipation.  Genitourinary: Negative.   Skin: Negative.   Psychiatric/Behavioral: Positive for confusion.       Patient's wife admits to a solitary episode of confusion.    Vital Signs: BP (!) 126/91 (BP Location: Left Arm, Patient Position: Sitting, Cuff Size: Normal)   Pulse 61   Temp 98 F (36.7 C) (Oral)   Resp 15   Ht 5\' 3"  (1.6 m)   Wt 162 lb (73.5 kg)   BMI 28.70 kg/m   Physical Exam  Constitutional: He appears well-developed and well-nourished.  Eyes: Conjunctivae are normal.  Cardiovascular:  Murmur heard. Irregularly  irregular rhythm.  Pulmonary/Chest: Effort normal and breath sounds normal.  Abdominal: Bowel sounds are normal. He exhibits distension. There is rebound. A hernia is present.  Small periumbilical hernia.  Skin: Skin is warm and dry.  Psychiatric: He has a normal mood and affect. His behavior is normal.  Nursing note and vitals reviewed.   Imaging: Ct Abdomen Pelvis W Contrast  Result Date: 11/11/2016 CLINICAL DATA:  cirrhosis who has been requiring increasing frequency of paracentesis. Last paracentesis 2 days ago in the day after he was very fatigued, did not get out of bed, early satiety, worsening abdominal pain. His abdominal pain became worse the day he presented to the ER. His abdomen is distended and tympanic on exam. In the ER 10 mL of fluid were drawn off the fluid pocket in his abdominal cavity with cell count unable to be completed due to the fluid clotting as it was serosanguineous. Fluid culture is pending. His lactic acid was elevated. His platelet count is typically normal. His hemoglobin has trended down from 15.4-12.1 in the emergency room. It is declined further from 12.1-10.0 over 6 hours through 4:00 in the morning this morning. He has been receiving hydration at that time EXAM: CT ABDOMEN AND PELVIS WITH CONTRAST TECHNIQUE: Multidetector CT imaging of the abdomen and pelvis was performed using the standard protocol following bolus administration of intravenous contrast. CONTRAST:  157mL ISOVUE-300 IOPAMIDOL (ISOVUE-300) INJECTION 61% COMPARISON:  Ultrasound 10/27/2016, CT 07/15/2016 FINDINGS: Lower chest: Transvenous pacing leads partially visualized. Visualized lung bases clear. No pleural or pericardial effusion. Aortic valve calcifications. Hepatobiliary: Nodular liver contour. No focal lesion or biliary ductal dilatation. The gallbladder is nondistended. Pancreas: Unremarkable. No pancreatic ductal dilatation or surrounding inflammatory changes. Spleen: Normal in size without  focal abnormality. Adrenals/Urinary Tract: Normal adrenal glands. Focal areas of parenchymal loss in the right kidney. No renal mass, urolithiasis, or hydronephrosis. Urinary bladder is nondistended. Stomach/Bowel: Stomach is incompletely distended, unremarkable. Small bowel is nondilated. There is good distal passage of the oral contrast material. Normal appendix. The colon is nondilated. Vascular/Lymphatic: Portal vein patent. No enlarged venous collateral channels evident. Scattered aortic plaque without aneurysm. No adenopathy localized. Reproductive: Prostate is unremarkable. Other: There is a large amount of relatively high attenuation abdominal ascites with some hyperdense clot in the cul-de-sac. No active extravasation. No abnormal peritoneal enhancement. No free air. Musculoskeletal: No acute  or significant osseous findings. IMPRESSION: 1. Large amount of relatively high density peritoneal fluid with clot in the cul-de-sac suggesting hemorrhage into ascites. No evidence of active extravasation. 2. Hepatic cirrhosis. No additional stigmata of portal venous hypertension. Electronically Signed   By: Lucrezia Europe M.D.   On: 11/11/2016 20:11   US Paracentesis  Result Date: 11/23/2016 INDICATION: Cirrhosis, ascites EXAM: ULTRASOUND GUIDED  PARACENTESIS MEDICATIONS: None. COMPLICATIONS: None immediate. PROCEDURE: Informed written consent was obtained from the patient after a discussion of the risks, benefits and alternatives to treatment. A timeout was performed prior to the initiation of the procedure. Initial ultrasound scanning demonstrates a large amount of ascites within the right lower abdominal quadrant. The right lower abdomen was prepped and draped in the usual sterile fashion. 1% lidocaine with epinephrine was used for local anesthesia. Following this, a 19 gauge, 7-cm, Yueh catheter was introduced. An ultrasound image was saved for documentation purposes. The paracentesis was performed. The catheter  was removed and a dressing was applied. The patient tolerated the procedure well without immediate post procedural complication. FINDINGS: A total of approximately 10 L of bloody fluid was removed. Samples were sent to the laboratory as requested by the clinical team. IMPRESSION: Successful ultrasound-guided paracentesis yielding 10 liters of peritoneal fluid. Electronically Signed   By: Rolm Baptise M.D.   On: 11/23/2016 09:45   US Paracentesis  Result Date: 11/09/2016 INDICATION: 56 year old male with history of ascites.  Cirrhosis. EXAM: ULTRASOUND GUIDED  PARACENTESIS MEDICATIONS: None. COMPLICATIONS: None immediate. PROCEDURE: Informed written consent was obtained from the patient after a discussion of the risks, benefits and alternatives to treatment. A timeout was performed prior to the initiation of the procedure. Initial ultrasound scanning demonstrates a large amount of ascites within the right lower abdominal quadrant. The right lower abdomen was prepped and draped in the usual sterile fashion. 1% lidocaine with epinephrine was used for local anesthesia. Following this, a 19 gauge, 7-cm, Yueh catheter was introduced. An ultrasound image was saved for documentation purposes. The paracentesis was performed. The catheter was removed and a dressing was applied. The patient tolerated the procedure well without immediate post procedural complication. FINDINGS: A total of approximately 4 L of serous fluid was removed. IMPRESSION: Successful ultrasound-guided paracentesis yielding 4 liters of peritoneal fluid. Electronically Signed   By: Vinnie Langton M.D.   On: 11/09/2016 14:42   Dg Abd Acute W/chest  Result Date: 11/13/2016 CLINICAL DATA:  Acute onset of generalized abdominal pain and bloating. Initial encounter. EXAM: DG ABDOMEN ACUTE W/ 1V CHEST COMPARISON:  CT of the abdomen and pelvis performed 11/11/2016 FINDINGS: The lungs are hypoexpanded, with mild right basilar atelectasis. There is no  evidence of pleural effusion or pneumothorax. The cardiomediastinal silhouette is mildly enlarged. A pacemaker is noted overlying the left chest wall, with leads ending overlying the right atrium and right ventricle. The visualized bowel gas pattern is unremarkable. Scattered stool and air are seen within the colon; there is no evidence of small bowel dilatation to suggest obstruction. No free intra-abdominal air is identified on the provided upright view. No acute osseous abnormalities are seen; the sacroiliac joints are unremarkable in appearance. IMPRESSION: 1. Unremarkable bowel gas pattern; no free intra-abdominal air seen. Small amount of stool noted in the colon. 2. Lungs hypoexpanded, with mild right basilar atelectasis. 3. Mild cardiomegaly. Electronically Signed   By: Garald Balding M.D.   On: 11/13/2016 02:04    Labs:  CBC:  Recent Labs  11/10/16 2042 11/11/16 0412 11/11/16  1536 11/12/16 0508 11/14/16 1459  WBC 13.9* 9.8  --  9.9 11.4*  HGB 12.1* 10.0* 10.0* 10.0* 10.5*  HCT 36.8* 29.9* 30.0* 31.0* 32.4*  PLT 445* 297  --  309 379    COAGS:  Recent Labs  07/15/16 1256 11/09/16 1306 11/11/16 1536 11/30/16 1203  INR 1.08 1.03 1.16 1.1    BMP:  Recent Labs  11/10/16 2042 11/11/16 0412 11/12/16 0508 11/14/16 1459  NA 135 134* 134* 138  K 5.4* 4.3 4.2 4.6  CL 99* 99* 101 107  CO2 26 27 25 25   GLUCOSE 162* 217* 106* 174*  BUN 34* 34* 25* 20  CALCIUM 8.6* 7.9* 7.9* 8.3*  CREATININE 1.68* 1.36* 0.92 0.84  GFRNONAA 44* 57* >60 >60  GFRAA 51* >60 >60 >60    LIVER FUNCTION TESTS:  Recent Labs  07/15/16 1256 07/16/16 0701 11/10/16 2042 11/11/16 0412  BILITOT 1.3* 1.2 1.1 0.8  AST 121* 91* 106* 81*  ALT 82* 71* 91* 75*  ALKPHOS 534* 530* 368* 289*  PROT 6.7 6.0* 5.7* 4.7*  ALBUMIN 3.0* 2.8* 2.7* 2.2*    TUMOR MARKERS: No results for input(s): AFPTM, CEA, CA199, CHROMGRNA in the last 8760 hours.  Assessment and Plan:  [name] is a 56 y.o.  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 The patient is accompanied by his wife though serves as his own historian.  The patient has undergone several serial large volumes paracentesis initially on beginning in October of last year.  Most recent paracentesis was performed on 11/22/2016 yielding 10 L of peritoneal fluid.  The patient wife reports a solitary episode of potential altered mental status approximately 1 month ago. Otherwise, there is no complaint of encephalopathy.   Calculated MELD score (based predominantly on laboratories obtained on 11/11/2016) demonstrated the patient is a excellent candidate for potential TIPS creation with a score of 7.  Additionally, the patient was seen by his cardiologist, Dr. Lovena Le, on 11/25/2016 and his cardiovascular risk profile was deemed acceptable.  As such, potential benefits and risks (including but not limited to bleeding, infection, exposure to general anesthesia, worsening encephalopathy and worsening heart function) following TIPS creation was discussed in detail with the patient and the patient's wife with fair understanding.  Following this detailed discussion, the patient wishes to pursue attempted TIPS creation.   Patient will have repeat lab work obtained today and assuming preservation of an adequate MELD score, we will coordinate to schedule the TIPS procedure at [facility] with general anesthesia.  Given the complexity of these procedures, I will try to arrange for one of my interventional  radiology colleagues to be available for assistance, ideally, in the biplane IR room.   The procedure will entail an overnight admission for continued observation.  Thank you for this interesting consult.  I greatly enjoyed meeting [name] and look forward to participating in their care.  A copy of this report was sent to the requesting provider on this date.  Electronically Signed: Sandi Mariscal 11/30/2016, 5:37 PM   I spent a total of 40 Minutes in face to face in clinical consultation, greater than 50% of which was  counseling/coordinating care for TIPS candidacy

## 2016-11-30 NOTE — Telephone Encounter (Signed)
Labs are back from [place] and are in AB box

## 2016-11-30 NOTE — Telephone Encounter (Signed)
The home health nurse called to say that the patient's BUN was at 29

## 2016-12-03 ENCOUNTER — Other Ambulatory Visit (HOSPITAL_COMMUNITY): Payer: Self-pay | Admitting: Interventional Radiology

## 2016-12-03 ENCOUNTER — Telehealth: Payer: Self-pay

## 2016-12-03 ENCOUNTER — Other Ambulatory Visit: Payer: Self-pay

## 2016-12-03 ENCOUNTER — Ambulatory Visit (HOSPITAL_COMMUNITY)
Admission: RE | Admit: 2016-12-03 | Discharge: 2016-12-03 | Disposition: A | Discharge status: Home / Self Care | Payer: Medicare Other | Source: Ambulatory Visit | Attending: Gastroenterology | Admitting: Gastroenterology

## 2016-12-03 ENCOUNTER — Encounter (HOSPITAL_COMMUNITY): Payer: Self-pay

## 2016-12-03 DIAGNOSIS — R188 Other ascites: Principal | ICD-10-CM

## 2016-12-03 DIAGNOSIS — K7460 Unspecified cirrhosis of liver: Secondary | ICD-10-CM

## 2016-12-03 NOTE — Telephone Encounter (Signed)
Pt is going today for a PARA at 3:00 pm. Wife is aware of appointment

## 2016-12-03 NOTE — Procedures (Signed)
PreOperative Dx: Cirrhosis, ascites Postoperative Dx: Cirrhosis, ascites Procedure:   US guided paracentesis Radiologist:  Thornton Papas Anesthesia:  10 ml of1% lidocaine Specimen:  9.3 L of brownish-red ascitic fluid EBL:   < 1 ml Complications: None

## 2016-12-03 NOTE — Telephone Encounter (Signed)
Is his abdomen distended? How is he feeling?

## 2016-12-03 NOTE — Telephone Encounter (Signed)
Abd is distended. He feels tight and eating much. Some trouble breathing. Waiting on TIPPS.

## 2016-12-03 NOTE — Telephone Encounter (Signed)
[name] from [facility] called to let us know that his WT on Monday was 133.4lb and today (Friday) his WT is 167.8lb. His last PARA was on 11/23/16. Please advise

## 2016-12-03 NOTE — Telephone Encounter (Signed)
Will need paracentesis then. No fluid analyses needed. 25 g Albumin IV at 4 liters and 12.5 g Albumin IV for every 4 thereafter.

## 2016-12-03 NOTE — Progress Notes (Signed)
Paracentesis complete no signs of distress. 9.3L ascites removed.

## 2016-12-06 ENCOUNTER — Telehealth: Payer: Self-pay | Admitting: Internal Medicine

## 2016-12-06 ENCOUNTER — Other Ambulatory Visit: Payer: Self-pay

## 2016-12-06 DIAGNOSIS — R188 Other ascites: Principal | ICD-10-CM

## 2016-12-06 DIAGNOSIS — K7460 Unspecified cirrhosis of liver: Secondary | ICD-10-CM

## 2016-12-06 NOTE — Telephone Encounter (Signed)
Pt's wife Jeannene Patella) called to say that patient had a para done on Friday and was told to call us to get an order where he can go once a week to have a para done. She prefers a Wednesday or a Thursday. Please call her at [phone]

## 2016-12-06 NOTE — Telephone Encounter (Signed)
Para scheduled for 12/09/16 at 9:00am, arrive at 8:45am. Called and informed pt.

## 2016-12-09 ENCOUNTER — Ambulatory Visit (HOSPITAL_COMMUNITY)
Admission: RE | Admit: 2016-12-09 | Discharge: 2016-12-09 | Disposition: A | Discharge status: Home / Self Care | Payer: Medicare Other | Source: Ambulatory Visit | Attending: Nurse Practitioner | Admitting: Nurse Practitioner

## 2016-12-09 ENCOUNTER — Encounter (HOSPITAL_COMMUNITY): Payer: Self-pay

## 2016-12-09 DIAGNOSIS — R188 Other ascites: Secondary | ICD-10-CM | POA: Insufficient documentation

## 2016-12-09 MED ORDER — ALBUMIN HUMAN 25 % IV SOLN
INTRAVENOUS | Status: AC
  Filled 2016-12-09: qty 100

## 2016-12-09 MED ORDER — ALBUMIN HUMAN 25 % IV SOLN
25.0000 g | Freq: Once | INTRAVENOUS | Status: AC
  Administered 2016-12-09: 25 g via INTRAVENOUS

## 2016-12-09 NOTE — Progress Notes (Signed)
Paracentesis complete no signs of distress. 7165ml yellow colored ascites removed.

## 2016-12-09 NOTE — Procedures (Signed)
PreOperative Dx: Cirrhosis, ascites Postoperative Dx: Cirrhosis, ascites Procedure:   US guided paracentesis Radiologist:  Thornton Papas Anesthesia:  10 ml of1% lidocaine Specimen:  7.1 L of yellow ascitic fluid EBL:   < 1 ml Complications: None

## 2016-12-10 ENCOUNTER — Other Ambulatory Visit (HOSPITAL_COMMUNITY): Payer: Self-pay | Admitting: Pharmacist

## 2016-12-10 ENCOUNTER — Encounter (HOSPITAL_COMMUNITY)
Admission: RE | Admit: 2016-12-10 | Discharge: 2016-12-10 | Disposition: A | Discharge status: Home / Self Care | Payer: Medicare Other | Source: Ambulatory Visit | Attending: General Surgery | Admitting: General Surgery

## 2016-12-10 ENCOUNTER — Encounter (HOSPITAL_COMMUNITY): Payer: Self-pay

## 2016-12-10 DIAGNOSIS — Z9079 Acquired absence of other genital organ(s): Secondary | ICD-10-CM | POA: Insufficient documentation

## 2016-12-10 DIAGNOSIS — Z79899 Other long term (current) drug therapy: Secondary | ICD-10-CM | POA: Diagnosis not present

## 2016-12-10 DIAGNOSIS — H9190 Unspecified hearing loss, unspecified ear: Secondary | ICD-10-CM | POA: Diagnosis not present

## 2016-12-10 DIAGNOSIS — Z950 Presence of cardiac pacemaker: Secondary | ICD-10-CM | POA: Insufficient documentation

## 2016-12-10 DIAGNOSIS — K219 Gastro-esophageal reflux disease without esophagitis: Secondary | ICD-10-CM | POA: Diagnosis not present

## 2016-12-10 DIAGNOSIS — E039 Hypothyroidism, unspecified: Secondary | ICD-10-CM | POA: Insufficient documentation

## 2016-12-10 DIAGNOSIS — Z01812 Encounter for preprocedural laboratory examination: Secondary | ICD-10-CM | POA: Insufficient documentation

## 2016-12-10 DIAGNOSIS — Z01818 Encounter for other preprocedural examination: Secondary | ICD-10-CM | POA: Diagnosis not present

## 2016-12-10 DIAGNOSIS — G4733 Obstructive sleep apnea (adult) (pediatric): Secondary | ICD-10-CM | POA: Diagnosis not present

## 2016-12-10 DIAGNOSIS — I350 Nonrheumatic aortic (valve) stenosis: Secondary | ICD-10-CM | POA: Insufficient documentation

## 2016-12-10 DIAGNOSIS — Z9981 Dependence on supplemental oxygen: Secondary | ICD-10-CM | POA: Insufficient documentation

## 2016-12-10 DIAGNOSIS — Z794 Long term (current) use of insulin: Secondary | ICD-10-CM | POA: Diagnosis not present

## 2016-12-10 DIAGNOSIS — I110 Hypertensive heart disease with heart failure: Secondary | ICD-10-CM | POA: Diagnosis not present

## 2016-12-10 DIAGNOSIS — I480 Paroxysmal atrial fibrillation: Secondary | ICD-10-CM | POA: Insufficient documentation

## 2016-12-10 DIAGNOSIS — I5032 Chronic diastolic (congestive) heart failure: Secondary | ICD-10-CM | POA: Insufficient documentation

## 2016-12-10 DIAGNOSIS — D649 Anemia, unspecified: Secondary | ICD-10-CM | POA: Diagnosis not present

## 2016-12-10 DIAGNOSIS — J449 Chronic obstructive pulmonary disease, unspecified: Secondary | ICD-10-CM | POA: Insufficient documentation

## 2016-12-10 DIAGNOSIS — I495 Sick sinus syndrome: Secondary | ICD-10-CM | POA: Diagnosis not present

## 2016-12-10 DIAGNOSIS — Z7982 Long term (current) use of aspirin: Secondary | ICD-10-CM | POA: Diagnosis not present

## 2016-12-10 DIAGNOSIS — K7460 Unspecified cirrhosis of liver: Secondary | ICD-10-CM | POA: Diagnosis not present

## 2016-12-10 DIAGNOSIS — E785 Hyperlipidemia, unspecified: Secondary | ICD-10-CM | POA: Diagnosis not present

## 2016-12-10 DIAGNOSIS — K7581 Nonalcoholic steatohepatitis (NASH): Secondary | ICD-10-CM | POA: Insufficient documentation

## 2016-12-10 DIAGNOSIS — E119 Type 2 diabetes mellitus without complications: Secondary | ICD-10-CM | POA: Insufficient documentation

## 2016-12-10 HISTORY — DX: Other specified postprocedural states: Z98.890

## 2016-12-10 HISTORY — DX: Dyspnea, unspecified: R06.00

## 2016-12-10 HISTORY — DX: Heart failure, unspecified: I50.9

## 2016-12-10 HISTORY — DX: Hypothyroidism, unspecified: E03.9

## 2016-12-10 HISTORY — DX: Inflammatory liver disease, unspecified: K75.9

## 2016-12-10 HISTORY — DX: Personal history of urinary calculi: Z87.442

## 2016-12-10 HISTORY — DX: Presence of cardiac pacemaker: Z95.0

## 2016-12-10 HISTORY — DX: Nausea with vomiting, unspecified: R11.2

## 2016-12-10 HISTORY — DX: Unspecified osteoarthritis, unspecified site: M19.90

## 2016-12-10 LAB — CBC
HCT: 48.5 % (ref 39.0–52.0)
HEMOGLOBIN: 15.7 g/dL (ref 13.0–17.0)
MCH: 31 pg (ref 26.0–34.0)
MCHC: 32.4 g/dL (ref 30.0–36.0)
MCV: 95.7 fL (ref 78.0–100.0)
Platelets: 461 10*3/uL — ABNORMAL HIGH (ref 150–400)
RBC: 5.07 MIL/uL (ref 4.22–5.81)
RDW: 15.5 % (ref 11.5–15.5)
WBC: 11.9 10*3/uL — ABNORMAL HIGH (ref 4.0–10.5)

## 2016-12-10 LAB — GLUCOSE, CAPILLARY: Glucose-Capillary: 435 mg/dL — ABNORMAL HIGH (ref 65–99)

## 2016-12-10 LAB — COMPREHENSIVE METABOLIC PANEL
ALK PHOS: 408 U/L — AB (ref 38–126)
ALT: 61 U/L (ref 17–63)
ANION GAP: 13 (ref 5–15)
AST: 64 U/L — ABNORMAL HIGH (ref 15–41)
Albumin: 2.9 g/dL — ABNORMAL LOW (ref 3.5–5.0)
BUN: 34 mg/dL — ABNORMAL HIGH (ref 6–20)
CALCIUM: 9.1 mg/dL (ref 8.9–10.3)
CO2: 26 mmol/L (ref 22–32)
Chloride: 94 mmol/L — ABNORMAL LOW (ref 101–111)
Creatinine, Ser: 1.2 mg/dL (ref 0.61–1.24)
GFR calc non Af Amer: >60 mL/min (ref >60)
Glucose, Bld: 249 mg/dL — ABNORMAL HIGH (ref 65–99)
Potassium: 4.6 mmol/L (ref 3.5–5.1)
SODIUM: 133 mmol/L — AB (ref 135–145)
TOTAL PROTEIN: 6.1 g/dL — AB (ref 6.5–8.1)
Total Bilirubin: 0.8 mg/dL (ref 0.3–1.2)

## 2016-12-10 LAB — PROTIME-INR
INR: 1.09
PROTHROMBIN TIME: 14.2 s (ref 11.4–15.2)

## 2016-12-10 LAB — APTT: aPTT: 35 seconds (ref 24–36)

## 2016-12-10 NOTE — Progress Notes (Signed)
[employer] Rep Tomi Bamberger notified of date and time of pt/ surgery.

## 2016-12-10 NOTE — Pre-Procedure Instructions (Addendum)
[name]  12/10/2016      [place], [place] - [address] [place] [address] Phone: [phone] Fax: [phone]    Your procedure is scheduled on 12-14-2016   Tuesday .  Report to [facility] Admitting at 6:00 A.M.   Call this number if you have problems the morning of surgery:  [phone]   Remember:  Do not eat food or drink liquids after midnight.   Take these medicines the morning of surgery with A SIP OF WATER Tylenol if needed,cetirizine(Zyrtec),Metoprolol(Toprol XL),Provigil,Nasonex nasal spray if needed,omeprazole(Prilosec),     STOP ASPIRIN,ANTIINFLAMATORIES (IBUPROFEN,ALEVE,MOTRIN,ADVIL,GOODY'S POWDERS),HERBAL SUPPLEMENTS,FISH OIL,AND VITAMINS 5-7 DAYS PRIOR TO SURGERY       How to Manage Your Diabetes Before and After Surgery  Why is it important to control my blood sugar before and after surgery? . Improving blood sugar levels before and after surgery helps healing and can limit problems. . A way of improving blood sugar control is eating a healthy diet by: o  Eating less sugar and carbohydrates o  Increasing activity/exercise o  Talking with your doctor about reaching your blood sugar goals . High blood sugars (greater than 180 mg/dL) can raise your risk of infections and slow your recovery, so you will need to focus on controlling your diabetes during the weeks before surgery. . Make sure that the doctor who takes care of your diabetes knows about your planned surgery including the date and location.  How do I manage my blood sugar before surgery? . Check your blood sugar at least 4 times a day, starting 2 days before surgery, to make sure that the level is not too high or low. o Check your blood sugar the morning of your surgery when you wake up and every 2 hours until you get to the Short Stay unit. . If your blood sugar is less than 70 mg/dL, you will need to treat for low  blood sugar: o Do not take insulin. o Treat a low blood sugar (less than 70 mg/dL) with  cup of clear juice (cranberry or apple), 4 glucose tablets, OR glucose gel. o Recheck blood sugar in 15 minutes after treatment (to make sure it is greater than 70 mg/dL). If your blood sugar is not greater than 70 mg/dL on recheck, call [phone] for further instructions. . Report your blood sugar to the short stay nurse when you get to Short Stay.  . If you are admitted to the hospital after surgery: o Your blood sugar will be checked by the staff and you will probably be given insulin after surgery (instead of oral diabetes medicines) to make sure you have good blood sugar levels. o The goal for blood sugar control after surgery is 80-180 mg/dL.              WHAT DO I DO ABOUT MY DIABETES MEDICATION?   Marland Kitchen Do not take oral diabetes medicines (pills) the morning of surgery.  . THE NIGHT BEFORE SURGERY, take ____5 units of ____75/25 insulin.     Marland Kitchen HE MORNING OF SURGERY, take _______0______ units of _75/25___insulin..     Reviewed and Endorsed by [facility] Patient Education Committee, August 2015     Do not wear jewelry, .  Do not wear lotions, powders, or perfumes, or deoderant.  Do not shave 48 hours prior to surgery.  Men may shave face and neck.  Do not bring valuables to the hospital.  [facility] is not responsible for any belongings or valuables.  Contacts, dentures or bridgework may not be worn into surgery.  Leave your suitcase in the car.  After surgery it may be brought to your room.  For patients admitted to the hospital, discharge time will be determined by your treatment team.  Patients discharged the day of surgery will not be allowed to drive home.    Special Instructions: [facility] - Preparing for Surgery  Before surgery, you can play an important role.  Because skin is not sterile, your skin needs to be as free of germs as possible.  You can reduce the  number of germs on you skin by washing with CHG (chlorahexidine gluconate) soap before surgery.  CHG is an antiseptic cleaner which kills germs and bonds with the skin to continue killing germs even after washing.  Please DO NOT use if you have an allergy to CHG or antibacterial soaps.  If your skin becomes reddened/irritated stop using the CHG and inform your nurse when you arrive at Short Stay.  Do not shave (including legs and underarms) for at least 48 hours prior to the first CHG shower.  You may shave your face.  Please follow these instructions carefully:   1.  Shower with CHG Soap the night before surgery and the   morning of Surgery.  2.  If you choose to wash your hair, wash your hair first as usual with your normal shampoo.  3.  After you shampoo, rinse your hair and body thoroughly to remove the  Shampoo.  4.  Use CHG as you would any other liquid soap.  You can apply chg directly  to the skin and wash gently with scrungie or a clean washcloth.  5.  Apply the CHG Soap to your body ONLY FROM THE NECK DOWN.   Do not use on open wounds or open sores.  Avoid contact with your eyes,  ears, mouth and genitals (private parts).  Wash genitals (private parts) with your normal soap.  6.  Wash thoroughly, paying special attention to the area where your surgery will be performed.  7.  Thoroughly rinse your body with warm water from the neck down.  8.  DO NOT shower/wash with your normal soap after using and rinsing o  the CHG Soap.  9.  Pat yourself dry with a clean towel.            10.  Wear clean pajamas.            11.  Place clean sheets on your bed the night of your first shower and do not sleep with pets.  Day of Surgery  Do not apply any lotions/deodorants the morning of surgery.  Please wear clean clothes to the hospital/surgery center.   Marland Kitchen

## 2016-12-10 NOTE — Progress Notes (Addendum)
Anesthesia PAT Evaluation: Patient is a 56 year old male scheduled for TIPS on 12/14/16 by Dr. Sandi Mariscal. Patient is deaf, but can read lips. Wife will be with patient on the day of his procedure. She wears a hearing aid.   History includes nonsmoker, NASH cirrhosis (s/p last paracentesis 12/09/16, 7.1 L; hospitalized for hypotension/traumatic tap 11/10/16), congenital deafness, COPD, home oxygen (4L/[place]), OSA (Bi-Pap), diastolic CHF, afib/PAF, syncope, tachy-brady syndrome s/p pacemaker ~ 1995 [facility] (replacement 06/12/04 and 10/04/13-[employer] Adapta L ADDRL1 by Dr. Lovena Le), aortic stenosis (moderate-severe 01/2016), DM on insulin, hyperlipidemia, hypertension, hypothyroidism, anemia, GERD, anemia, BPH s/p TURP, T&A, nasal sinus surgery, orchiectomy (bilateral) '90. No current ETOH.   - PCP is Dr. Petra Kuba with [facility]. - GI is Dr. Bridgette Habermann with [facility]. - Pulmonologist is Dr. Sinda Du. - Primary cardiologist is Dr. Carlyle Dolly, last visit 01/26/16 with Jory Sims, NP. Echo showed moderate-severe AS. Since he was asymptomatic, one year echo follow-up recommended.  - EP Cardiologist is Dr. Cristopher Peru, last visit 11/25/16. He wrote:  Assess/Plan: 1. PAF - he is at risk for stroke but is not a candidate for watchman or for systemic anti-coagulation. His rate is well controlled 2. Symptomatic sinus node dysfunction - he is s/p PPM and is asymptomatic 3. Diastolic CHF - his symptoms are well controlled. Will follow. 4. Preoperative evaluation - he is pending a possible TIPS procedure. His cardiovascular risk is acceptable from this although the intraprocedural risk is not trivial.   Meds include albuterol, aspirin 325 mg (last dose 12/09/16), Zyrtec, Lasix, Humalog 75/25, lactulose, levothyroxine, Toprol-XL, Provigil, Nasonex, nitroglycerin, fish oil, Prilosec, Aldactone, ursodiol (not taking).  BP 113/83   Pulse (!) 106    Temp 36.4 C   Resp 18   Ht 5' 3"  (1.6 m)   Wt 141 lb 12.8 oz (64.3 kg)   SpO2 92%   BMI 25.12 kg/m  Patient appears chronically ill, but in NAD. Heart RRR, III/VI SEM. Lungs slightly course with few basilar crackles that primarily cleared with deep breaths. Mild pre-tibial edema. Mallampati II. He has several missing teeth, but denied any loss teeth.   EKG 09/30/16: ST at 108 bpm, RSR prime in V1 or V2, consider VCD or RVH. Inferior infarct, old. Poor r wave progression, consider anterior infarct. Prolonged QT interval.  Echo 02/02/16: Study Conclusions - Left ventricle: The cavity size was normal. Wall thickness was   increased in a pattern of moderate LVH. Systolic function was   normal. The estimated ejection fraction was in the range of 55%   to 60%. Diastolic function is abnormal, indeterminate grade. - Mitral valve: Normal leaflet thickness. No significant regurgitation. - Aortic valve: Moderately calcified annulus. Severely thickened   leaflets. There was moderate to severe stenosis. Mean gradient   (S): 24 mm Hg. VTI ratio of LVOT to aortic valve: 0.27. Valve   area (VTI): 0.94 cm^2. Valve area (Vmax): 0.81 cm^2. Valve area   (Vmean): 0.69 cm^2. - Right ventricle: The cavity size was moderately dilated. - Technically adequate study.  Cardiac cath 07/13/13 [facility]): Findings: 1. Left main coronary artery has no occlusive disease. 2. LAD has no occlusive disease. 3. Circumflex no occlusive disease. 4. Right coronary dominant with no occlusive disease. 5. LVEF approximately 60%. No obvious wall motion regional wall motion abnormalities. No mitral regurgitation. Left ventricular end-diastolic pressure is mild to moderately elevated 24 mmHg. 6. There is no aortic stenosis on pullback across the aortic  valve. 7. 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  Carotid U/S 03/19/14: IMPRESSION: No evidence of internal carotid  artery stenosis. 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  DG Abd Acute w/ chest 11/13/16: IMPRESSION: 1. Unremarkable bowel gas pattern; no free intra-abdominal air seen. Small amount of stool noted in the colon. 2. Lungs hypoexpanded, with mild right basilar atelectasis. 3. Mild cardiomegaly. Left sided pacemaker.  CT abd/pelvis 11/11/16: IMPRESSION: 1. Large amount of relatively high density peritoneal fluid with clot in the cul-de-sac suggesting hemorrhage into ascites. No evidence of active extravasation. 2. Hepatic cirrhosis. No additional stigmata of portal venous hypertension.  Preoperative labs noted. Na 133, K 4.6, Cr 1.20, ALK PHOS 408, AST 64, ALT 61, total bili 0.8. Total protein 6.1. WBC 11.9, H/H 15.7/48.5, PLT 461. CBG on arrival was 435, but 249 with labs. He reported just eating prior to PAT, but that home glucose readings are 130's-160's. A1c is in process (update: A1c 7.0, consistent with average glucose 154).  Patient has multiple co-morbidities including cirrhosis, aortic stenosis, DM, PPM, hypoxemia (home O2). He does have cardiology clearance. Denied chest pain and syncope. No SOB at rest currently. He is able to walk around his house using a walker. No increased work of breathing noted today. He underwent paracentesis yesterday. He typically gets every 1-2 weeks once he develops weight gain with abdominal pain. He is without complaint today. Reviewed with anesthesiologist Dr. Deatra Canter. Further evaluation on the day of procedure to ensure no acute changes prior to proceeding.   [facility] Short Stay Center/Anesthesiology Phone [phone] 12/10/2016 7:19 PM

## 2016-12-10 NOTE — Progress Notes (Addendum)
[name] aware of Blood sugar of 434. Multiple health problems .  [name] called to see patient.    Peri-Operative Device Orders Faxed to [employer].  [employer] called, to page rep to notify date and time of surgery

## 2016-12-11 LAB — HEMOGLOBIN A1C
Hgb A1c MFr Bld: 7 % — ABNORMAL HIGH (ref 4.8–5.6)
Mean Plasma Glucose: 154 mg/dL

## 2016-12-13 ENCOUNTER — Encounter (HOSPITAL_COMMUNITY): Payer: Self-pay | Admitting: Anesthesiology

## 2016-12-13 ENCOUNTER — Other Ambulatory Visit: Payer: Self-pay | Admitting: Physician Assistant

## 2016-12-13 ENCOUNTER — Other Ambulatory Visit: Payer: Self-pay | Admitting: Radiology

## 2016-12-13 ENCOUNTER — Other Ambulatory Visit (HOSPITAL_COMMUNITY): Payer: Self-pay | Admitting: *Deleted

## 2016-12-13 NOTE — Anesthesia Preprocedure Evaluation (Addendum)
Anesthesia Evaluation  Patient identified by MRN, date of birth, ID band Patient awake    Reviewed: Allergy & Precautions, NPO status , Patient's Chart, lab work & pertinent test results, reviewed documented beta blocker date and time   History of Anesthesia Complications (+) PONV and history of anesthetic complications  Airway Mallampati: III  TM Distance: >3 FB Neck ROM: Full    Dental  (+) Poor Dentition, Missing, Chipped   Pulmonary shortness of breath, with exertion, at rest, lying and Long-Term Oxygen Therapy, sleep apnea and Continuous Positive Airway Pressure Ventilation , COPD,  COPD inhaler,    breath sounds clear to auscultation + decreased breath sounds      Cardiovascular hypertension, Pt. on medications and Pt. on home beta blockers +CHF  + dysrhythmias Atrial Fibrillation + pacemaker + Valvular Problems/Murmurs AS  Rhythm:Regular Rate:Normal + Diastolic murmurs Mild AS   Neuro/Psych Congenital deafness negative psych ROS   GI/Hepatic GERD  Medicated and Controlled,(+) Cirrhosis   ascites  substance abuse  alcohol use, Hepatitis -, BElevated LFT's   Endo/Other  diabetes, Poorly Controlled, Type 2, Insulin DependentHypothyroidism Hyperlipidemia  Renal/GU Renal diseaseHx/o renal calculi   BPH    Musculoskeletal  (+) Arthritis ,   Abdominal   Peds  Hematology  (+) anemia ,   Anesthesia Other Findings   Reproductive/Obstetrics                            Anesthesia Physical Anesthesia Plan  ASA: IV  Anesthesia Plan: General   Post-op Pain Management:    Induction: Intravenous, Rapid sequence and Cricoid pressure planned  Airway Management Planned: Oral ETT  Additional Equipment: Arterial line  Intra-op Plan:   Post-operative Plan: Extubation in OR and Possible Post-op intubation/ventilation  Informed Consent: I have reviewed the patients History and Physical,  chart, labs and discussed the procedure including the risks, benefits and alternatives for the proposed anesthesia with the patient or authorized representative who has indicated his/her understanding and acceptance.   Dental advisory given  Plan Discussed with: CRNA, Anesthesiologist and Surgeon  Anesthesia Plan Comments:        Anesthesia Quick Evaluation

## 2016-12-14 ENCOUNTER — Inpatient Hospital Stay (HOSPITAL_COMMUNITY): Payer: Medicare Other

## 2016-12-14 ENCOUNTER — Observation Stay (HOSPITAL_COMMUNITY)
Admission: RE | Admit: 2016-12-14 | Discharge: 2016-12-15 | Disposition: A | Discharge status: Home / Self Care | Payer: Medicare Other | Source: Ambulatory Visit | Attending: Interventional Radiology | Admitting: Interventional Radiology

## 2016-12-14 ENCOUNTER — Encounter (HOSPITAL_COMMUNITY): Payer: Self-pay | Admitting: *Deleted

## 2016-12-14 ENCOUNTER — Inpatient Hospital Stay (HOSPITAL_COMMUNITY): Payer: Medicare Other | Admitting: Critical Care Medicine

## 2016-12-14 ENCOUNTER — Inpatient Hospital Stay (HOSPITAL_COMMUNITY): Payer: Medicare Other | Admitting: Vascular Surgery

## 2016-12-14 ENCOUNTER — Encounter (HOSPITAL_COMMUNITY)
Admission: RE | Disposition: A | Discharge status: Home / Self Care | Payer: Self-pay | Source: Ambulatory Visit | Attending: Interventional Radiology

## 2016-12-14 DIAGNOSIS — G4733 Obstructive sleep apnea (adult) (pediatric): Secondary | ICD-10-CM | POA: Insufficient documentation

## 2016-12-14 DIAGNOSIS — K7460 Unspecified cirrhosis of liver: Secondary | ICD-10-CM

## 2016-12-14 DIAGNOSIS — K7031 Alcoholic cirrhosis of liver with ascites: Principal | ICD-10-CM | POA: Insufficient documentation

## 2016-12-14 DIAGNOSIS — Z8249 Family history of ischemic heart disease and other diseases of the circulatory system: Secondary | ICD-10-CM | POA: Diagnosis not present

## 2016-12-14 DIAGNOSIS — I110 Hypertensive heart disease with heart failure: Secondary | ICD-10-CM | POA: Insufficient documentation

## 2016-12-14 DIAGNOSIS — E669 Obesity, unspecified: Secondary | ICD-10-CM | POA: Diagnosis not present

## 2016-12-14 DIAGNOSIS — I509 Heart failure, unspecified: Secondary | ICD-10-CM | POA: Insufficient documentation

## 2016-12-14 DIAGNOSIS — R188 Other ascites: Secondary | ICD-10-CM

## 2016-12-14 DIAGNOSIS — Z794 Long term (current) use of insulin: Secondary | ICD-10-CM | POA: Insufficient documentation

## 2016-12-14 DIAGNOSIS — I340 Nonrheumatic mitral (valve) insufficiency: Secondary | ICD-10-CM | POA: Insufficient documentation

## 2016-12-14 DIAGNOSIS — E119 Type 2 diabetes mellitus without complications: Secondary | ICD-10-CM | POA: Diagnosis not present

## 2016-12-14 DIAGNOSIS — E785 Hyperlipidemia, unspecified: Secondary | ICD-10-CM | POA: Insufficient documentation

## 2016-12-14 DIAGNOSIS — M1990 Unspecified osteoarthritis, unspecified site: Secondary | ICD-10-CM | POA: Diagnosis not present

## 2016-12-14 DIAGNOSIS — K219 Gastro-esophageal reflux disease without esophagitis: Secondary | ICD-10-CM | POA: Insufficient documentation

## 2016-12-14 DIAGNOSIS — Z950 Presence of cardiac pacemaker: Secondary | ICD-10-CM | POA: Diagnosis not present

## 2016-12-14 DIAGNOSIS — Z6824 Body mass index (BMI) 24.0-24.9, adult: Secondary | ICD-10-CM | POA: Diagnosis not present

## 2016-12-14 DIAGNOSIS — I4892 Unspecified atrial flutter: Secondary | ICD-10-CM | POA: Diagnosis not present

## 2016-12-14 DIAGNOSIS — K7581 Nonalcoholic steatohepatitis (NASH): Secondary | ICD-10-CM

## 2016-12-14 DIAGNOSIS — Z7982 Long term (current) use of aspirin: Secondary | ICD-10-CM | POA: Diagnosis not present

## 2016-12-14 DIAGNOSIS — I700 Atherosclerosis of aorta: Secondary | ICD-10-CM | POA: Insufficient documentation

## 2016-12-14 DIAGNOSIS — E039 Hypothyroidism, unspecified: Secondary | ICD-10-CM | POA: Diagnosis not present

## 2016-12-14 DIAGNOSIS — H905 Unspecified sensorineural hearing loss: Secondary | ICD-10-CM | POA: Diagnosis not present

## 2016-12-14 DIAGNOSIS — J449 Chronic obstructive pulmonary disease, unspecified: Secondary | ICD-10-CM | POA: Insufficient documentation

## 2016-12-14 HISTORY — PX: IR GENERIC HISTORICAL: IMG1180011

## 2016-12-14 HISTORY — DX: Unspecified cirrhosis of liver: K74.60

## 2016-12-14 HISTORY — PX: RADIOLOGY WITH ANESTHESIA: SHX6223

## 2016-12-14 LAB — GLUCOSE, CAPILLARY
GLUCOSE-CAPILLARY: 220 mg/dL — AB (ref 65–99)
GLUCOSE-CAPILLARY: 266 mg/dL — AB (ref 65–99)
Glucose-Capillary: 243 mg/dL — ABNORMAL HIGH (ref 65–99)

## 2016-12-14 LAB — POCT I-STAT 7, (LYTES, BLD GAS, ICA,H+H)
Bicarbonate: 23.7 mmol/L (ref 20.0–28.0)
CALCIUM ION: 1.05 mmol/L — AB (ref 1.15–1.40)
HEMATOCRIT: 38 % — AB (ref 39.0–52.0)
Hemoglobin: 12.9 g/dL — ABNORMAL LOW (ref 13.0–17.0)
O2 SAT: 95 %
PCO2 ART: 32.4 mmHg (ref 32.0–48.0)
PH ART: 7.466 — AB (ref 7.350–7.450)
POTASSIUM: 4 mmol/L (ref 3.5–5.1)
Patient temperature: 35.6
SODIUM: 130 mmol/L — AB (ref 135–145)
TCO2: 25 mmol/L (ref 0–100)
pO2, Arterial: 64 mmHg — ABNORMAL LOW (ref 83.0–108.0)

## 2016-12-14 LAB — PROTIME-INR
INR: 1.1
Prothrombin Time: 14.3 seconds (ref 11.4–15.2)

## 2016-12-14 LAB — TYPE AND SCREEN
ABO/RH(D): O POS
ANTIBODY SCREEN: NEGATIVE

## 2016-12-14 LAB — APTT: APTT: 35 s (ref 24–36)

## 2016-12-14 LAB — ABO/RH: ABO/RH(D): O POS

## 2016-12-14 SURGERY — RADIOLOGY WITH ANESTHESIA
Anesthesia: General

## 2016-12-14 MED ORDER — MODAFINIL 100 MG PO TABS
200.0000 mg | ORAL_TABLET | Freq: Every day | ORAL | Status: DC
Start: 2016-12-15 — End: 2016-12-15
  Administered 2016-12-15: 200 mg via ORAL
  Filled 2016-12-14: qty 2

## 2016-12-14 MED ORDER — LEVOTHYROXINE SODIUM 50 MCG PO TABS
50.0000 ug | ORAL_TABLET | Freq: Every day | ORAL | Status: DC
  Administered 2016-12-15: 50 ug via ORAL
  Filled 2016-12-14: qty 1

## 2016-12-14 MED ORDER — SPIRONOLACTONE 50 MG PO TABS
50.0000 mg | ORAL_TABLET | Freq: Every day | ORAL | Status: DC
  Administered 2016-12-15: 50 mg via ORAL
  Filled 2016-12-14: qty 1

## 2016-12-14 MED ORDER — ONDANSETRON HCL 4 MG/2ML IJ SOLN
INTRAMUSCULAR | Status: DC | PRN
  Administered 2016-12-14: 4 mg via INTRAVENOUS

## 2016-12-14 MED ORDER — IOPAMIDOL (ISOVUE-300) INJECTION 61%
INTRAVENOUS | Status: AC
  Administered 2016-12-14: 75 mL
  Filled 2016-12-14: qty 100

## 2016-12-14 MED ORDER — SODIUM CHLORIDE 0.9% FLUSH
3.0000 mL | Freq: Two times a day (BID) | INTRAVENOUS | Status: DC
Start: 2016-12-14 — End: 2016-12-15
  Administered 2016-12-14: 3 mL via INTRAVENOUS

## 2016-12-14 MED ORDER — ALBUMIN HUMAN 5 % IV SOLN
INTRAVENOUS | Status: DC | PRN
  Administered 2016-12-14 (×2): via INTRAVENOUS

## 2016-12-14 MED ORDER — SODIUM CHLORIDE 0.9 % IV SOLN: INTRAVENOUS | Status: DC

## 2016-12-14 MED ORDER — PANTOPRAZOLE SODIUM 40 MG PO TBEC
40.0000 mg | DELAYED_RELEASE_TABLET | Freq: Every day | ORAL | Status: DC
  Administered 2016-12-15: 40 mg via ORAL
  Filled 2016-12-14: qty 1

## 2016-12-14 MED ORDER — ALBUTEROL SULFATE HFA 108 (90 BASE) MCG/ACT IN AERS
INHALATION_SPRAY | RESPIRATORY_TRACT | Status: DC | PRN
  Administered 2016-12-14: 2 via RESPIRATORY_TRACT

## 2016-12-14 MED ORDER — IOPAMIDOL (ISOVUE-300) INJECTION 61%
INTRAVENOUS | Status: AC
  Administered 2016-12-14: 30 mL
  Filled 2016-12-14: qty 150

## 2016-12-14 MED ORDER — ETOMIDATE 2 MG/ML IV SOLN
INTRAVENOUS | Status: DC | PRN
  Administered 2016-12-14: 14 mg via INTRAVENOUS

## 2016-12-14 MED ORDER — IOPAMIDOL (ISOVUE-300) INJECTION 61%
INTRAVENOUS | Status: AC
  Administered 2016-12-14: 20 mL
  Filled 2016-12-14: qty 100

## 2016-12-14 MED ORDER — INSULIN LISPRO PROT & LISPRO (75-25 MIX) 100 UNIT/ML KWIKPEN: 8.0000 [IU] | PEN_INJECTOR | Freq: Two times a day (BID) | SUBCUTANEOUS | Status: DC

## 2016-12-14 MED ORDER — PHENYLEPHRINE HCL 10 MG/ML IJ SOLN
INTRAVENOUS | Status: DC | PRN
  Administered 2016-12-14: 11:00:00 via INTRAVENOUS
  Administered 2016-12-14: 25 ug/min via INTRAVENOUS

## 2016-12-14 MED ORDER — LIDOCAINE 2% (20 MG/ML) 5 ML SYRINGE: INTRAMUSCULAR | Status: DC | PRN

## 2016-12-14 MED ORDER — SODIUM CHLORIDE 0.9% FLUSH: 3.0000 mL | INTRAVENOUS | Status: DC | PRN

## 2016-12-14 MED ORDER — LACTATED RINGERS IV SOLN
INTRAVENOUS | Status: DC | PRN
  Administered 2016-12-14: 07:00:00 via INTRAVENOUS

## 2016-12-14 MED ORDER — FUROSEMIDE 20 MG PO TABS
20.0000 mg | ORAL_TABLET | Freq: Every day | ORAL | Status: DC
  Filled 2016-12-14: qty 1

## 2016-12-14 MED ORDER — VANCOMYCIN HCL IN DEXTROSE 1-5 GM/200ML-% IV SOLN
1000.0000 mg | Freq: Once | INTRAVENOUS | Status: AC
  Administered 2016-12-14: 1000 mg via INTRAVENOUS
  Filled 2016-12-14: qty 200

## 2016-12-14 MED ORDER — FENTANYL CITRATE (PF) 100 MCG/2ML IJ SOLN
INTRAMUSCULAR | Status: DC | PRN
  Administered 2016-12-14 (×2): 50 ug via INTRAVENOUS
  Administered 2016-12-14: 25 ug via INTRAVENOUS

## 2016-12-14 MED ORDER — PROPOFOL 10 MG/ML IV BOLUS
INTRAVENOUS | Status: DC | PRN
  Administered 2016-12-14: 40 mg via INTRAVENOUS

## 2016-12-14 MED ORDER — METOPROLOL SUCCINATE ER 25 MG PO TB24
25.0000 mg | ORAL_TABLET | Freq: Every day | ORAL | Status: DC
  Administered 2016-12-15: 25 mg via ORAL
  Filled 2016-12-14: qty 1

## 2016-12-14 MED ORDER — INSULIN ASPART PROT & ASPART (70-30 MIX) 100 UNIT/ML ~~LOC~~ SUSP
8.0000 [IU] | Freq: Two times a day (BID) | SUBCUTANEOUS | Status: DC
  Administered 2016-12-14 – 2016-12-15 (×2): 8 [IU] via SUBCUTANEOUS
  Filled 2016-12-14: qty 10

## 2016-12-14 MED ORDER — MIDAZOLAM HCL 5 MG/5ML IJ SOLN
INTRAMUSCULAR | Status: DC | PRN
  Administered 2016-12-14: 1 mg via INTRAVENOUS

## 2016-12-14 MED ORDER — FENTANYL CITRATE (PF) 100 MCG/2ML IJ SOLN: 25.0000 ug | INTRAMUSCULAR | Status: DC | PRN

## 2016-12-14 MED ORDER — LACTULOSE 10 GM/15ML PO SOLN
10.0000 g | Freq: Two times a day (BID) | ORAL | Status: DC
  Administered 2016-12-14 (×2): 10 g via ORAL
  Filled 2016-12-14 (×3): qty 15

## 2016-12-14 MED ORDER — SUGAMMADEX SODIUM 200 MG/2ML IV SOLN
INTRAVENOUS | Status: DC | PRN
  Administered 2016-12-14: 130 mg via INTRAVENOUS

## 2016-12-14 MED ORDER — ALBUTEROL SULFATE (2.5 MG/3ML) 0.083% IN NEBU: 2.5000 mg | INHALATION_SOLUTION | Freq: Four times a day (QID) | RESPIRATORY_TRACT | Status: DC | PRN

## 2016-12-14 MED ORDER — SODIUM CHLORIDE 0.9 % IV SOLN: 250.0000 mL | INTRAVENOUS | Status: DC | PRN

## 2016-12-14 MED ORDER — ROCURONIUM BROMIDE 50 MG/5ML IV SOSY
PREFILLED_SYRINGE | INTRAVENOUS | Status: DC | PRN
  Administered 2016-12-14: 40 mg via INTRAVENOUS
  Administered 2016-12-14: 10 mg via INTRAVENOUS

## 2016-12-14 MED ORDER — ONDANSETRON HCL 4 MG/2ML IJ SOLN: 4.0000 mg | Freq: Once | INTRAMUSCULAR | Status: DC | PRN

## 2016-12-14 MED ORDER — PHENYLEPHRINE 40 MCG/ML (10ML) SYRINGE FOR IV PUSH (FOR BLOOD PRESSURE SUPPORT)
PREFILLED_SYRINGE | INTRAVENOUS | Status: DC | PRN
  Administered 2016-12-14: 40 ug via INTRAVENOUS
  Administered 2016-12-14: 80 ug via INTRAVENOUS

## 2016-12-14 NOTE — Progress Notes (Signed)
PA assessed patient this afternoon after admission for attempted TIPS procedure.  Patient resting comfortably in bed. Sipping Ensure and beverages with tolerance. Denies any concerns.  He is aware of the status of the procedure today. Insertion sites are intact with clean, dry dressings in place.   Brynda Greathouse, MS RD PA-C 4:37 PM

## 2016-12-14 NOTE — Progress Notes (Signed)
[name] is a 56 y.o. male patient admitted from PACU awake, alert - oriented  X 3 - no acute distress noted.  VSS - Blood pressure 94/63, pulse 86, temperature 97.5 F (36.4 C), temperature source Oral, resp. rate 18, weight 64 kg (141 lb), SpO2 99 %.    IV in place, occlusive dsg intact without redness.  Orientation to room, and floor completed with information packet given to patient/family.  Patient declined safety video at this time.  Admission INP armband ID verified with patient/family, and in place.   SR up x 2, fall assessment complete, with patient and family able to verbalize understanding of risk associated with falls, and verbalized understanding to call nsg before up out of bed.  Call light within reach, patient able to voice, and demonstrate understanding.  Skin, clean-dry- intact without evidence of bruising, or skin tears.   No evidence of skin break down noted on exam.  Surgical incisions with translucent dressing and dry gauze underneath.     Will cont to eval and treat per MD orders.  Fredric Mare, RN 12/14/2016 2:05 PM

## 2016-12-14 NOTE — H&P (Signed)
Chief Complaint: Patient was seen in consultation today for Transjugular Intrahepatic Portal System shunt placement at the request of Dr Loretha Stapler  Referring Physician(s): DR Loretha Stapler  Supervising Physician: Sandi Mariscal  Patient Status: [facility] - Out-pt  History of Present Illness: [name] is a 56 y.o. male   Refractory ascites Cirrhosis Most recent paracentesis 3/22: 7.1 L MELD 9 today  Dr Owens Shark consult 11/30/16:  [name] is a 56 y.o. 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 The patient is accompanied by his wife though serves as his own historian. The patient has undergone several serial large volumes paracentesis initially on beginning in October of last year.  Most recent paracentesis was performed on 11/22/2016 yielding 10 L of peritoneal fluid.  The patient wife reports a solitary episode of potential altered mental status approximately 1 month ago. Otherwise, there is no complaint of encephalopathy.  Calculated MELD score (based predominantly on laboratories obtained on 11/11/2016) demonstrated the patient is a excellent candidate for potential TIPS creation with a score of 7. Additionally, the patient was seen by his cardiologist, Dr. Lovena Le, on 11/25/2016 and his cardiovascular risk profile was deemed acceptable. As such, potential benefits and risks (including but not limited to bleeding, infection, exposure to general anesthesia, worsening encephalopathy and worsening heart function) following TIPS creation was discussed in  detail with the patient and the patient's wife with fair understanding. Following this detailed discussion, the patient wishes to pursue attempted TIPS creation.   Now scheduled for procedure   Past Medical History:  Diagnosis Date  . Alcohol use   . Anemia   . Aortic stenosis    mild  . Arthritis   . Atrial flutter ([place])   . Benign hypertrophy of prostate    with urinary retention; repaired hypospadia; transurethral resection of the prostate   . Cardiac conduction disorder    status post pacemaker implantation in 1995 generator replaced 2005  . Chest pain 2007, 2009   cardiac cath > normal coronary arterie; nl left ventricular function  . CHF (congestive heart failure) ([place])   . Congenital deafness   . COPD (chronic obstructive pulmonary disease) ([place])   . Diabetes mellitus    +Insulin  . Dyspnea   . Edema   . GERD (gastroesophageal reflux disease)    status post multiple dilatations for stricture  . 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 Hepatitis    Thinks it was B  . History of kidney stones   . HTN (hypertension)   . Hyperlipidemia    statin discontinued due to abn LFTs  . Hypothyroidism   . Mitral regurgitation    insignificant  . Obesity   . Obesity 4/08   BMI= 40  . Pelvic mass    identified in 2009, stable 2010  . PONV (postoperative nausea and vomiting)   . Presence of permanent cardiac pacemaker   . Sleep apnea    BiPAP and continuous oxygen  . Syncope   . Thyroid disease     Past Surgical History:  Procedure Laterality Date  . A-V  CARDIAC PACEMAKER INSERTION  1995  . COLONOSCOPY  2008  . COLONOSCOPY N/A 06/23/2015   [id number] diverticulosis  . ESOPHAGOGASTRODUODENOSCOPY N/A 06/23/2015   UUV:[id number] portal gastropathy  . NASAL SINUS SURGERY     partially removed  . ORCHIECTOMY  1990   bilateral; ?neoplasm  . PACEMAKER INSERTION  05/2004; 10/04/2013   MDT dual chamber pacemaker; gen  change 10/04/2013 (MDT ADDRL1)  . PERMANENT PACEMAKER GENERATOR CHANGE N/A 10/04/2013   Procedure: PERMANENT PACEMAKER GENERATOR CHANGE;  Surgeon: Evans Lance, MD;  Location: [facility] CATH LAB;  Service: Cardiovascular;  Laterality: N/A;  . REPAIR HYPOSPADIAS W/ URETHROPLASTY    . TONSILLECTOMY AND ADENOIDECTOMY    . TRANSURETHRAL RESECTION OF PROSTATE     for BPH    Allergies: Enalapril; Metformin and related; Eliquis [apixaban]; and Penicillins  Medications: Prior to Admission medications   Medication Sig Start Date End Date Taking? Authorizing Provider  acetaminophen (TYLENOL) 325 MG tablet Take 650 mg by mouth every 6 (six) hours as needed for mild pain.   Yes Historical Provider, MD  aspirin 325 MG EC tablet Take 325 mg by mouth daily.   Yes Historical Provider, MD  cetirizine (ZYRTEC) 10 MG tablet Take 10 mg by mouth daily.   Yes Historical Provider, MD  furosemide (LASIX) 20 MG tablet Take 1 tablet (20 mg total) by mouth daily. Take along with spironolactone 11/22/16  Yes Annitta Needs, NP  Glucosamine-Chondroit-Vit C-Mn (GLUCOSAMINE CHONDR 1500 COMPLX PO) Take 1 tablet by mouth every morning.   Yes Historical Provider, MD  HUMALOG MIX 75/25 KWIKPEN (75-25) 100 UNIT/ML Kwikpen Inject 8 Units into the skin 2 (two) times daily. 15 units in the morning and 15 units in the evening Patient taking differently: Inject 8 Units into the skin 2 (two) times daily.  11/14/16  Yes Eber Jones, MD  lactulose [facility]) 10 GM/15ML solution 10cc (2 teaspoons) bid; Titrate to 2-3 semi formed stools a day Patient taking differently: Take 10 g by mouth 2 (two) times daily. 10cc (2 teaspoons) bid; Titrate to 2-3 semi formed stools a day 11/14/16  Yes Eber Jones, MD  levothyroxine (LEVOXYL) 50 MCG tablet Take 50 mcg by mouth daily before breakfast.    Yes Historical Provider, MD  metoprolol succinate (TOPROL-XL) 25 MG 24 hr tablet Take 25 mg by mouth daily.   Yes Historical Provider, MD    modafinil (PROVIGIL) 200 MG tablet Take 200 mg by mouth daily.  01/13/15  Yes Historical Provider, MD  mometasone (NASONEX) 50 MCG/ACT nasal spray Place 2 sprays into the nose daily as needed (FOR CONGESTION/ALLERGIES).   Yes Historical Provider, MD  Multiple Vitamins-Minerals (CENTRUM) tablet Take 1 tablet by mouth daily.     Yes Historical Provider, MD  Omega-3 Fatty Acids (FISH OIL) 1000 MG CAPS Take 1 capsule by mouth every other day.    Yes Historical Provider, MD  omeprazole (PRILOSEC) 40 MG capsule TAKE 1 CAPSULE BY MOUTH ONCE DAILY FOR REFLUX. 09/17/16  Yes Arnoldo Lenis, MD  spironolactone (ALDACTONE) 50 MG tablet TAKE 1 TABLET BY MOUTH ONCE DAILY. 10/19/16  Yes Arnoldo Lenis, MD  albuterol (PROVENTIL HFA;VENTOLIN HFA) 108 (90 Base) MCG/ACT inhaler Inhale 2 puffs into the lungs every 6 (six) hours as needed for wheezing or shortness of breath.    Historical Provider, MD  albuterol (PROVENTIL) (2.5 MG/3ML) 0.083% nebulizer solution Take 2.5 mg by nebulization every 6 (six) hours as needed for wheezing or shortness of breath.    Historical  Provider, MD  gentamicin ointment (GARAMYCIN) 0.1 % Apply 1 application topically at bedtime. Inside nose     Historical Provider, MD  nitroGLYCERIN (NITROSTAT) 0.4 MG SL tablet Place 1 tablet (0.4 mg total) under the tongue every 5 (five) minutes as needed for chest pain. 06/26/15   Lendon Colonel, NP  NON FORMULARY Bipap 16.0/e    Historical Provider, MD  ursodiol (ACTIGALL) 500 MG tablet TAKE 1 TABLET BY MOUTH TWICE DAILY 08/11/16   Carlis Stable, NP     Family History  Problem Relation Age of Onset  . COPD Mother   . Hypertension Mother   . Congestive Heart Failure Mother   . Pneumonia Father   . Heart disease Other   . Hypertension Other   . Colon cancer Neg Hx     Social History   Social History  . Marital status: Married    Spouse name: N/A  . Number of children: N/A  . Years of education: N/A   Social History Main Topics  .  Smoking status: Never Smoker  . Smokeless tobacco: Never Used     Comment: tobacco use- no   . Alcohol use No     Comment: Hx. of alcohol use  . Drug use: No  . Sexual activity: Yes   Other Topics Concern  . None   Social History Narrative   Part time.     Review of Systems: A 12 point ROS discussed and pertinent positives are indicated in the HPI above.  All other systems are negative.  Review of Systems  Constitutional: Positive for activity change, appetite change and fatigue. Negative for diaphoresis, fever and unexpected weight change.  Respiratory: Positive for shortness of breath. Negative for cough.   Cardiovascular: Negative for chest pain.  Gastrointestinal: Positive for abdominal distention. Negative for abdominal pain and nausea.  Skin: Negative for color change.  Neurological: Positive for weakness.  Psychiatric/Behavioral: Negative for behavioral problems and confusion.    Vital Signs: BP 118/85   Pulse 90   Temp 98 F (36.7 C) (Oral)   Resp 16   Wt 141 lb (64 kg)   SpO2 93%   BMI 24.98 kg/m   Physical Exam  Constitutional: He is oriented to person, place, and time.  HENT:  Head: Atraumatic.  Mostly deaf can read lips well and hear little  Eyes: EOM are normal.  Neck: Neck supple.  Cardiovascular: Normal rate.   Murmur heard. Pulmonary/Chest: Effort normal and breath sounds normal. He has no wheezes.  Abdominal: Soft. Bowel sounds are normal. He exhibits distension. There is no tenderness.  Musculoskeletal: Normal range of motion.  Neurological: He is alert and oriented to person, place, and time.  Skin: Skin is warm and dry.  Psychiatric: He has a normal mood and affect. His behavior is normal. Judgment and thought content normal.  Nursing note and vitals reviewed.   Mallampati Score:  MD Evaluation Airway: WNL Heart: WNL Abdomen: WNL Chest/ Lungs: WNL ASA  Classification: 3 Mallampati/Airway Score: One  Imaging: US  Paracentesis  Result Date: 12/09/2016 INDICATION: Cirrhosis, ascites EXAM: ULTRASOUND GUIDED THERAPEUTIC PARACENTESIS MEDICATIONS: None. COMPLICATIONS: None immediate. PROCEDURE: Procedure, benefits, and risks of procedure were discussed with patient. Written informed consent for procedure was obtained. Time out protocol followed. Adequate collection of ascites localized by ultrasound in RIGHT lower quadrant. Skin prepped and draped in usual sterile fashion. Skin and soft tissues anesthetized with 10 mL of 1% lidocaine. 5 [place] Yueh catheter placed into peritoneal cavity.  7.1 L of yellow ascitic fluid aspirated by vacuum bottle suction. Procedure tolerated well by patient without immediate complication. FINDINGS: As above IMPRESSION: Successful ultrasound-guided paracentesis yielding 7.1 liters of peritoneal fluid. Electronically Signed   By: Lavonia Dana M.D.   On: 12/09/2016 11:38   US Paracentesis  Result Date: 12/03/2016 INDICATION: Cirrhosis, ascites EXAM: ULTRASOUND GUIDED THERAPEUTIC PARACENTESIS MEDICATIONS: None. COMPLICATIONS: None immediate. PROCEDURE: Procedure, benefits, and risks of procedure were discussed with patient. Written informed consent for procedure was obtained from the patient's wife, his power of attorney. Time out protocol followed. Adequate collection of ascites localized by ultrasound in RIGHT lower quadrant. Skin prepped and draped in usual sterile fashion. Skin and soft tissues anesthetized with 10 mL of 1% lidocaine. 5 [place] Yueh catheter placed into peritoneal cavity. 9.3 L of brownish red fluid aspirated by vacuum bottle suction. Procedure tolerated well by patient without immediate complication. FINDINGS: As above IMPRESSION: Successful ultrasound-guided paracentesis yielding 9.3 liters of peritoneal fluid. Electronically Signed   By: Lavonia Dana M.D.   On: 12/03/2016 16:23   US Paracentesis  Result Date: 11/23/2016 INDICATION: Cirrhosis, ascites EXAM: ULTRASOUND GUIDED   PARACENTESIS MEDICATIONS: None. COMPLICATIONS: None immediate. PROCEDURE: Informed written consent was obtained from the patient after a discussion of the risks, benefits and alternatives to treatment. A timeout was performed prior to the initiation of the procedure. Initial ultrasound scanning demonstrates a large amount of ascites within the right lower abdominal quadrant. The right lower abdomen was prepped and draped in the usual sterile fashion. 1% lidocaine with epinephrine was used for local anesthesia. Following this, a 19 gauge, 7-cm, Yueh catheter was introduced. An ultrasound image was saved for documentation purposes. The paracentesis was performed. The catheter was removed and a dressing was applied. The patient tolerated the procedure well without immediate post procedural complication. FINDINGS: A total of approximately 10 L of bloody fluid was removed. Samples were sent to the laboratory as requested by the clinical team. IMPRESSION: Successful ultrasound-guided paracentesis yielding 10 liters of peritoneal fluid. Electronically Signed   By: Rolm Baptise M.D.   On: 11/23/2016 09:45    Labs:  CBC:  Recent Labs  11/11/16 0412 11/11/16 1536 11/12/16 0508 11/14/16 1459 12/10/16 1624  WBC 9.8  --  9.9 11.4* 11.9*  HGB 10.0* 10.0* 10.0* 10.5* 15.7  HCT 29.9* 30.0* 31.0* 32.4* 48.5  PLT 297  --  309 379 461*    COAGS:  Recent Labs  11/09/16 1306 11/11/16 1536 11/30/16 1203 12/10/16 1624  INR 1.03 1.16 1.1 1.09  APTT  --   --   --  35    BMP:  Recent Labs  11/12/16 0508 11/14/16 1459 11/30/16 1205 12/10/16 1624  NA 134* 138 141 133*  K 4.2 4.6 4.6 4.6  CL 101 107 99 94*  CO2 25 25 31 26   GLUCOSE 106* 174* 152* 249*  BUN 25* 20 30* 34*  CALCIUM 7.9* 8.3* 8.6 9.1  CREATININE 0.92 0.84 1.03 1.20  GFRNONAA >60 >60 81 >60  GFRAA >60 >60 >89 >60    LIVER FUNCTION TESTS:  Recent Labs  11/10/16 2042 11/11/16 0412 11/30/16 1205 12/10/16 1624  BILITOT 1.1 0.8  0.8 0.8  AST 106* 81* 61* 64*  ALT 91* 75* 58* 61  ALKPHOS 368* 289* 535* 408*  PROT 5.7* 4.7* 5.3* 6.1*  ALBUMIN 2.7* 2.2* 3.0* 2.9*    TUMOR MARKERS: No results for input(s): AFPTM, CEA, CA199, CHROMGRNA in the last 8760 hours.  Assessment and Plan:  Recurrent ascites; nonmalignant Multiple paracentesis--most recent 3/22 Discussed Transjugular intrahepatic portal system shunt placement procedure with Dr Pascal Lux in consultation 11/30/16 Now scheduled for same: MELD 9 today Risks and benefits including but not limited to: Infection; bleeding; worsening encephalopathy; death - were discussed with pt and wife. All are agreeable to proceed  Pt and wife understand he will be admitted overnight for observation and plan for discharge in am. Agreeable.    Thank you for this interesting consult.  I greatly enjoyed meeting [name] and look forward to participating in their care.  A copy of this report was sent to the requesting provider on this date.  Electronically Signed: Monia Sabal A 12/14/2016, 7:14 AM   I spent a total of  30 Minutes   in face to face in clinical consultation, greater than 50% of which was counseling/coordinating care for TIPS

## 2016-12-14 NOTE — Anesthesia Procedure Notes (Signed)
Arterial Line Insertion Performed by: Josephine Igo, anesthesiologist  Preanesthetic checklist: patient identified, IV checked, site marked, risks and benefits discussed, surgical consent, monitors and equipment checked, pre-op evaluation and timeout performed Lidocaine 1% used for infiltration Right, radial was placed Catheter size: 20 Fr Hand hygiene performed  and maximum sterile barriers used  Allen's test indicative of satisfactory collateral circulation Attempts: 3 Procedure performed without using ultrasound guided technique. Ultrasound Notes:anatomy identified Following insertion, dressing applied and Biopatch. Post procedure assessment: normal  Patient tolerated the procedure well with no immediate complications.

## 2016-12-14 NOTE — Progress Notes (Signed)
Wife stated patient is on a low sodium diet at home. Diet order changed.

## 2016-12-14 NOTE — Progress Notes (Signed)
Inpatient Diabetes Program Recommendations  AACE/ADA: New Consensus Statement on Inpatient Glycemic Control (2015)  Target Ranges:  Prepandial:   less than 140 mg/dL      Peak postprandial:   less than 180 mg/dL (1-2 hours)      Critically ill patients:  140 - 180 mg/dL   Review of Glycemic Control  Diabetes history: DM 2 Outpatient Diabetes medications: 75/25 8 units BID Current orders for Inpatient glycemic control: In OR  Inpatient Diabetes Program Recommendations:   Consider placing patient on Novolog Sensitive Correction 0-9 units tid + Novolog HS scale (0-5 units).  Consider ordering 70/30 8 units BID first dose this evening at supper.  Thanks,  Tama Headings RN, MSN, [facility] Inpatient Diabetes Coordinator Team Pager [phone] (8a-5p)

## 2016-12-14 NOTE — Anesthesia Postprocedure Evaluation (Signed)
Anesthesia Post Note  Patient: [name]  Procedure(s) Performed: Procedure(s) (LRB): TIPS (N/A)  Patient location during evaluation: PACU Anesthesia Type: General Level of consciousness: awake Pain management: pain level controlled Vital Signs Assessment: post-procedure vital signs reviewed and stable Respiratory status: spontaneous breathing Cardiovascular status: stable Anesthetic complications: no       Last Vitals:  Vitals:   12/14/16 1300 12/14/16 1315  BP: 90/63 95/70  Pulse: 92 89  Resp: (!) 22 18  Temp:  36.6 C    Last Pain:  Vitals:   12/14/16 1315  TempSrc:   PainSc: 0-No pain                 Chun Sellen

## 2016-12-14 NOTE — Anesthesia Procedure Notes (Signed)
Procedure Name: Intubation Date/Time: 12/14/2016 8:52 AM Performed by: Merrilyn Puma B Pre-anesthesia Checklist: Patient identified, Emergency Drugs available, Suction available, Patient being monitored and Timeout performed Patient Re-evaluated:Patient Re-evaluated prior to inductionOxygen Delivery Method: Circle system utilized Preoxygenation: Pre-oxygenation with 100% oxygen Intubation Type: IV induction Ventilation: Mask ventilation without difficulty Grade View: Grade I Tube type: Subglottic suction tube Tube size: 7.5 mm Number of attempts: 1 Airway Equipment and Method: Stylet Placement Confirmation: ETT inserted through vocal cords under direct vision,  positive ETCO2,  CO2 detector and breath sounds checked- equal and bilateral Secured at: 21 cm Tube secured with: Tape Dental Injury: Teeth and Oropharynx as per pre-operative assessment

## 2016-12-14 NOTE — Progress Notes (Addendum)
Dr. Pascal Lux paged to clarify foley catheter order and to make aware pt wears Bipap at home at bedtime. Verbal order to d/c foley and place orders for Bipap at bedtime.

## 2016-12-14 NOTE — Procedures (Signed)
Pre-procedure Diagnosis: Cirrhosis and recurrent ascites. Post-procedure Diagnosis: Same  Attempted though ultimately unsuccessful creation of a TIPs.  Successful US guided paracentesis yielding 3.3 L of ascites.  Complications: None Immediate  EBL: Minimal   The catheter is ready for immediate use.   Ronny Bacon, MD Pager #: [phone]

## 2016-12-14 NOTE — Transfer of Care (Signed)
Immediate Anesthesia Transfer of Care Note  Patient: [name]  Procedure(s) Performed: Procedure(s): TIPS (N/A)  Patient Location: PACU  Anesthesia Type:General  Level of Consciousness: awake, alert  and oriented  Airway & Oxygen Therapy: Patient Spontanous Breathing and Patient connected to nasal cannula oxygen  Post-op Assessment: Report given to RN, Post -op Vital signs reviewed and stable and Patient moving all extremities X 4  Post vital signs: Reviewed and stable  Last Vitals:  Vitals:   12/14/16 0621  BP: 118/85  Pulse: 90  Resp: 16  Temp: 36.7 C    Last Pain:  Vitals:   12/14/16 0621  TempSrc: Oral     BP 106/64, Sats 088% on Maili    Complications: No apparent anesthesia complications

## 2016-12-15 ENCOUNTER — Other Ambulatory Visit: Payer: Self-pay | Admitting: Cardiology

## 2016-12-15 ENCOUNTER — Encounter (HOSPITAL_COMMUNITY): Payer: Self-pay | Admitting: Interventional Radiology

## 2016-12-15 DIAGNOSIS — K7031 Alcoholic cirrhosis of liver with ascites: Secondary | ICD-10-CM | POA: Diagnosis not present

## 2016-12-15 LAB — GLUCOSE, CAPILLARY
GLUCOSE-CAPILLARY: 210 mg/dL — AB (ref 65–99)
GLUCOSE-CAPILLARY: 206 mg/dL — AB (ref 65–99)

## 2016-12-15 NOTE — Discharge Summary (Signed)
Patient ID: [name] MRN: [id number] DOB/AGE: 57-07-1961 57 y.o.  Admit date: 12/14/2016 Discharge date: 12/15/2016  Supervising Physician: Sandi Mariscal  Patient Status: [name] - In-pt  Admission Diagnoses: Cirrhosis with recurrent ascites  Discharge Diagnoses:  Active Problems:   Cirrhosis [facility])   Discharged Condition: stable  Hospital Course: Pt was seen in consultation with Dr Pascal Lux for Transjugular Intrahepatic Portal System Shunt placement after referral from PMD regarding Cirrhosis with recurrent ascites. Was scheduled for procedure 3/27. Attempt at procedure in IR: IMPRESSION: 1. Attempted though ultimately unsuccessful creation of a TIPS. 2. Successful ultrasound-guided paracentesis yielding 3.3 L of ascitic fluid. PLAN: - Unfortunately, given the shrunken cirrhotic liver and non conventional hepatic venous supply, the patient is not a candidate for TIPS placement.  Pt was admitted overnight for observation. Has done well. No complaints Denies pain; fever;chills. Denies N/V---did vomit once last night; none since---probably secondary anesthesia. Has eaten 2 meals without issue at this point. Urinating well and on own; has had BM this am I have seen and examined pt. Dr Pascal Lux aware of pt status. Plan now for discharge to home. Will be seen  In IR Clinic 4 weeks.   Consults: None  Significant Diagnostic Studies: Successful US guided paracentesis yielding 3.3 L of ascites.  Treatments: Attempted Transjugular Intrahepatic Portal System Shunt placement                     PLAN: - Unfortunately, given the shrunken cirrhotic liver and non conventional hepatic venous supply, the patient is not a candidate for TIPS placement.  Discharge Exam: Blood pressure 105/75, pulse 91, temperature 98.1 F (36.7 C), temperature source Oral, resp. rate 17, weight 141 lb (64 kg), SpO2 95 %.  PE: In NAD Appropriate [place]: RRR Lungs: CTA Abd: soft; not  distended No masses; no pain Ext: FROM Ambulated in room with help Skin: sites Rt abdomen: Clean and dry; NT; no bleeding No hematoma; no sign of infection Rt IJ site; clean and dry; NT; no bleeding UOP great Has urinated on own- clear yellow Has had BM this am  Results for orders placed or performed during the hospital encounter of 12/14/16  Glucose, capillary  Result Value Ref Range   Glucose-Capillary 243 (H) 65 - 99 mg/dL   Comment 1 Notify RN    Comment 2 Document in Chart   Protime-INR  Result Value Ref Range   Prothrombin Time 14.3 11.4 - 15.2 seconds   INR 1.10   APTT  Result Value Ref Range   aPTT 35 24 - 36 seconds  Glucose, capillary  Result Value Ref Range   Glucose-Capillary 220 (H) 65 - 99 mg/dL   Comment 1 Notify RN   Glucose, capillary  Result Value Ref Range   Glucose-Capillary 266 (H) 65 - 99 mg/dL  Glucose, capillary  Result Value Ref Range   Glucose-Capillary 210 (H) 65 - 99 mg/dL  I-STAT 7, (LYTES, BLD GAS, ICA, H+H)  Result Value Ref Range   pH, Arterial 7.466 (H) 7.350 - 7.450   pCO2 arterial 32.4 32.0 - 48.0 mmHg   pO2, Arterial 64.0 (L) 83.0 - 108.0 mmHg   Bicarbonate 23.7 20.0 - 28.0 mmol/L   TCO2 25 0 - 100 mmol/L   O2 Saturation 95.0 %   Sodium 130 (L) 135 - 145 mmol/L   Potassium 4.0 3.5 - 5.1 mmol/L   Calcium, Ion 1.05 (L) 1.15 - 1.40 mmol/L   HCT 38.0 (L) 39.0 - 52.0 %  Hemoglobin 12.9 (L) 13.0 - 17.0 g/dL   Patient temperature 35.6 C    Sample type ARTERIAL   Type and screen  Result Value Ref Range   ABO/RH(D) O POS    Antibody Screen NEG    Sample Expiration 12/17/2016   ABO/Rh  Result Value Ref Range   ABO/RH(D) O POS      Disposition: Cirrhosis with recurrent ascites Attempted TIPS procedure with Dr Pascal Lux in IR 3/27 Unsuccessful TIPS secondary shrunken cirrhotic liver and non conventional hepatic venous supply Plan: Continue all home meds Return to [facility] 4 weeks with Dr Leonard Downing and further discussion as  to next plan Pt and wife have good understanding of this plan Agreeable.  Discharge Instructions    Call MD for:  difficulty breathing, headache or visual disturbances    Complete by:  As directed    Call MD for:  extreme fatigue    Complete by:  As directed    Call MD for:  hives    Complete by:  As directed    Call MD for:  persistant dizziness or light-headedness    Complete by:  As directed    Call MD for:  persistant nausea and vomiting    Complete by:  As directed    Call MD for:  redness, tenderness, or signs of infection (pain, swelling, redness, odor or green/yellow discharge around incision site)    Complete by:  As directed    Call MD for:  severe uncontrolled pain    Complete by:  As directed    Call MD for:  temperature >100.4    Complete by:  As directed    Diet - low sodium heart healthy    Complete by:  As directed    Discharge instructions    Complete by:  As directed    Continue all home meds; return to clinic to see Dr Pascal Lux in 4 weeks---call [phone] if questions or concerns   Discharge wound care:    Complete by:  As directed    May shower today; change bandages to band aids after shower; keep clean band aids on all 3 sites for 1 week   Increase activity slowly    Complete by:  As directed    Lifting restrictions    Complete by:  As directed    No lifting over 10 lbs x 3 days     Allergies as of 12/15/2016      Reactions   Enalapril Cough   Metformin And Related Diarrhea   Eliquis [apixaban] Other (See Comments)   DOSE RELATED BLEEDING   Penicillins Other (See Comments)   Reaction unspecified/childhood allergy Has patient had a PCN reaction causing immediate rash, facial/tongue/throat swelling, SOB or lightheadedness with hypotension: Yes Has patient had a PCN reaction causing severe rash involving mucus membranes or skin necrosis: No Has patient had a PCN reaction that required hospitalization No Has patient had a PCN reaction occurring within  the last 10 years: No If all of the above answers are "NO", then may proceed with Cephalosporin use.      Medication List    TAKE these medications   acetaminophen 325 MG tablet Commonly known as:  TYLENOL Take 650 mg by mouth every 6 (six) hours as needed for mild pain.   albuterol (2.5 MG/3ML) 0.083% nebulizer solution Commonly known as:  PROVENTIL Take 2.5 mg by nebulization every 6 (six) hours as needed for wheezing or shortness of breath.   albuterol 108 (  90 Base) MCG/ACT inhaler Commonly known as:  PROVENTIL HFA;VENTOLIN HFA Inhale 2 puffs into the lungs every 6 (six) hours as needed for wheezing or shortness of breath.   aspirin 325 MG EC tablet Take 325 mg by mouth daily.   CENTRUM tablet Take 1 tablet by mouth daily.   cetirizine 10 MG tablet Commonly known as:  ZYRTEC Take 10 mg by mouth daily.   Fish Oil 1000 MG Caps Take 1 capsule by mouth every other day.   furosemide 20 MG tablet Commonly known as:  LASIX Take 1 tablet (20 mg total) by mouth daily. Take along with spironolactone   gentamicin ointment 0.1 % Commonly known as:  GARAMYCIN Apply 1 application topically at bedtime. Inside nose   GLUCOSAMINE CHONDR 1500 COMPLX PO Take 1 tablet by mouth every morning.   HUMALOG MIX 75/25 KWIKPEN (75-25) 100 UNIT/ML Kwikpen Generic drug:  Insulin Lispro Prot & Lispro Inject 8 Units into the skin 2 (two) times daily. 15 units in the morning and 15 units in the evening What changed:  additional instructions   lactulose 10 GM/15ML solution Commonly known as:  CHRONULAC 10cc (2 teaspoons) bid; Titrate to 2-3 semi formed stools a day What changed:  how much to take  how to take this  when to take this  additional instructions   LEVOXYL 50 MCG tablet Generic drug:  levothyroxine Take 50 mcg by mouth daily before breakfast.   metoprolol succinate 25 MG 24 hr tablet Commonly known as:  TOPROL-XL Take 25 mg by mouth daily.   modafinil 200 MG  tablet Commonly known as:  PROVIGIL Take 200 mg by mouth daily.   mometasone 50 MCG/ACT nasal spray Commonly known as:  NASONEX Place 2 sprays into the nose daily as needed (FOR CONGESTION/ALLERGIES).   nitroGLYCERIN 0.4 MG SL tablet Commonly known as:  NITROSTAT Place 1 tablet (0.4 mg total) under the tongue every 5 (five) minutes as needed for chest pain.   NON FORMULARY Bipap 16.0/e   omeprazole 40 MG capsule Commonly known as:  PRILOSEC TAKE 1 CAPSULE BY MOUTH ONCE DAILY FOR REFLUX.   spironolactone 50 MG tablet Commonly known as:  ALDACTONE TAKE 1 TABLET BY MOUTH ONCE DAILY.   ursodiol 500 MG tablet Commonly known as:  ACTIGALL TAKE 1 TABLET BY MOUTH TWICE DAILY      Follow-up Information    Sandi Mariscal, MD Follow up.   Specialty:  Interventional Radiology Why:  4 week follow up in [facility]; pt will hear from scheduler for time and date; call [phone] if questions or concerns Contact information: [address] [place] [address] [phone]            Electronically Signed: Monia Sabal A 12/15/2016, 9:57 AM   I have spent Greater Than 30 Minutes discharging Sherran Needs.

## 2016-12-15 NOTE — Progress Notes (Deleted)
Deleted note written on wrong patient

## 2016-12-15 NOTE — Evaluation (Signed)
Physical Therapy Evaluation and D/C  Patient Details Name: [name] MRN: [id number] DOB: 1960-09-22 Today's Date: 12/15/2016   History of Present Illness  Pt admit with cirrhosis.  Paracentesis performed and TIPS unsuccessful.    Clinical Impression  Pt admitted with above diagnosis. Pt currently without significant functional limitations and was able to ambulate entire unit with supervision with RW.  Plan is to go home today.  Recommend HHPT and HHAide if pt qualifies.  Pt close to baseline per his report.    Follow Up Recommendations Home health PT;Supervision - [facility] )    Equipment Recommendations  None recommended by PT    Recommendations for Other Services       Precautions / Restrictions Precautions Precautions: Fall Restrictions Weight Bearing Restrictions: No      Mobility  Bed Mobility Overal bed mobility: Independent                Transfers Overall transfer level: Independent                  Ambulation/Gait Ambulation/Gait assistance: Supervision Ambulation Distance (Feet): 520 Feet Assistive device: Rolling walker (2 wheeled) Gait Pattern/deviations: Step-through pattern;Decreased stride length   Gait velocity interpretation: Below normal speed for age/gender General Gait Details: Pt was able to ambulate with RW with good stability. Ambulated without RW as well with min guard assist.  Pt states he is close to baseline but a little weaker and will use his RW when he gets home.   Stairs            Wheelchair Mobility    Modified Rankin (Stroke Patients Only)       Balance Overall balance assessment: No apparent balance deficits (not formally assessed)                                           Pertinent Vitals/Pain Pain Assessment: No/denies pain    Home Living Family/patient expects to be discharged to:: Private residence Living Arrangements: Spouse/significant other Available Help at  Discharge: Family;Available 24 hours/day (close to 24 hours ) Type of Home: House Home Access: Ramped entrance     Home Layout: One level Home Equipment: Cane - single point;Walker - 2 wheels;Bedside commode;Shower seat (home O2)      Prior Function Level of Independence: Independent;Independent with assistive device(s)               Hand Dominance        Extremity/Trunk Assessment   Upper Extremity Assessment Upper Extremity Assessment: Defer to OT evaluation    Lower Extremity Assessment Lower Extremity Assessment: Overall WFL for tasks assessed    Cervical / Trunk Assessment Cervical / Trunk Assessment: Normal  Communication   Communication: No difficulties  Cognition Arousal/Alertness: Awake/alert Behavior During Therapy: WFL for tasks assessed/performed Overall Cognitive Status: Within Functional Limits for tasks assessed                                        General Comments      Exercises General Exercises - Lower Extremity Long Arc Quad: AROM;Both;10 reps;Seated   Assessment/Plan    PT Assessment All further PT needs can be met in the next venue of care  PT Problem List Decreased mobility  PT Treatment Interventions      PT Goals (Current goals can be found in the Care Plan section)  Acute Rehab PT Goals Patient Stated Goal: to go home PT Goal Formulation: All assessment and education complete, DC therapy    Frequency     Barriers to discharge        Co-evaluation               End of Session Equipment Utilized During Treatment: Gait belt;Oxygen Activity Tolerance: Patient tolerated treatment well Patient left: in chair;with call bell/phone within reach;with family/visitor present Nurse Communication: Mobility status PT Visit Diagnosis: Muscle weakness (generalized) (M62.81)    Time: 1140-1157 PT Time Calculation (min) (ACUTE ONLY): 17 min   Charges:   PT Evaluation $PT Eval Low Complexity: 1  Procedure     PT G Codes:   PT G-Codes **NOT FOR INPATIENT CLASS** Functional Assessment Tool Used: AM-PAC 6 Clicks Basic Mobility;Clinical judgement Functional Limitation: Mobility: Walking and moving around Mobility: Walking and Moving Around Current Status (E7035): At least 1 percent but less than 20 percent impaired, limited or restricted Mobility: Walking and Moving Around Goal Status [phone]): At least 1 percent but less than 20 percent impaired, limited or restricted Mobility: Walking and Moving Around Discharge Status [phone]): At least 1 percent but less than 20 percent impaired, limited or restricted    [facility] [phone] (pager)   Denice Paradise 12/15/2016, 2:25 PM

## 2016-12-15 NOTE — Progress Notes (Signed)
Patient discharged to home with instructions. 

## 2016-12-15 NOTE — Care Management Note (Signed)
Case Management Note  Patient Details  Name: GARRIT MARROW MRN: [id number] Date of Birth: 04/25/1961  Subjective/Objective:                    Action/Plan:  Has home oxygen , wife has portable tank Expected Discharge Date:  12/15/16               Expected Discharge Plan:  Home/Self Care  In-House Referral:     Discharge planning Services     Post Acute Care Choice:    Choice offered to:  Spouse  DME Arranged:    DME Agency:     HH Arranged:    [place] Agency:     Status of Service:  Completed, signed off  If discussed at [place] of Stay Meetings, dates discussed:    Additional Comments:  Marilu Favre, RN 12/15/2016, 11:22 AM

## 2016-12-16 ENCOUNTER — Ambulatory Visit (HOSPITAL_COMMUNITY): Payer: Medicare Other

## 2016-12-22 ENCOUNTER — Encounter (HOSPITAL_COMMUNITY): Payer: Self-pay

## 2016-12-22 ENCOUNTER — Ambulatory Visit (HOSPITAL_COMMUNITY)
Admission: RE | Admit: 2016-12-22 | Discharge: 2016-12-22 | Disposition: A | Discharge status: Home / Self Care | Payer: Medicare Other | Source: Ambulatory Visit | Attending: Nurse Practitioner | Admitting: Nurse Practitioner

## 2016-12-22 ENCOUNTER — Other Ambulatory Visit: Payer: Self-pay | Admitting: Nurse Practitioner

## 2016-12-22 DIAGNOSIS — R188 Other ascites: Secondary | ICD-10-CM

## 2016-12-22 DIAGNOSIS — K7460 Unspecified cirrhosis of liver: Secondary | ICD-10-CM

## 2016-12-22 MED ORDER — ALBUMIN HUMAN 25 % IV SOLN
25.0000 g | Freq: Once | INTRAVENOUS | Status: AC
  Administered 2016-12-22: 25 g via INTRAVENOUS

## 2016-12-22 MED ORDER — ALBUMIN HUMAN 25 % IV SOLN
INTRAVENOUS | Status: AC
  Administered 2016-12-22: 25 g via INTRAVENOUS
  Filled 2016-12-22: qty 100

## 2016-12-22 NOTE — Progress Notes (Signed)
Paracentesis complete no signs of distress. 4.3L red colored ascites removed.

## 2016-12-23 ENCOUNTER — Telehealth: Payer: Self-pay | Admitting: Internal Medicine

## 2016-12-23 ENCOUNTER — Inpatient Hospital Stay (HOSPITAL_COMMUNITY)
Admission: EM | Admit: 2016-12-23 | Discharge: 2016-12-26 | DRG: 872 | Disposition: A | Discharge status: Home Health | Payer: Medicare Other | Attending: Internal Medicine | Admitting: Internal Medicine

## 2016-12-23 ENCOUNTER — Encounter (HOSPITAL_COMMUNITY): Payer: Self-pay | Admitting: Emergency Medicine

## 2016-12-23 ENCOUNTER — Emergency Department (HOSPITAL_COMMUNITY): Payer: Medicare Other

## 2016-12-23 DIAGNOSIS — J449 Chronic obstructive pulmonary disease, unspecified: Secondary | ICD-10-CM | POA: Diagnosis not present

## 2016-12-23 DIAGNOSIS — Z6821 Body mass index (BMI) 21.0-21.9, adult: Secondary | ICD-10-CM

## 2016-12-23 DIAGNOSIS — E869 Volume depletion, unspecified: Secondary | ICD-10-CM | POA: Diagnosis present

## 2016-12-23 DIAGNOSIS — F1020 Alcohol dependence, uncomplicated: Secondary | ICD-10-CM | POA: Diagnosis present

## 2016-12-23 DIAGNOSIS — I4892 Unspecified atrial flutter: Secondary | ICD-10-CM | POA: Diagnosis present

## 2016-12-23 DIAGNOSIS — E039 Hypothyroidism, unspecified: Secondary | ICD-10-CM | POA: Diagnosis present

## 2016-12-23 DIAGNOSIS — E079 Disorder of thyroid, unspecified: Secondary | ICD-10-CM

## 2016-12-23 DIAGNOSIS — N179 Acute kidney failure, unspecified: Secondary | ICD-10-CM | POA: Diagnosis not present

## 2016-12-23 DIAGNOSIS — Z950 Presence of cardiac pacemaker: Secondary | ICD-10-CM

## 2016-12-23 DIAGNOSIS — I110 Hypertensive heart disease with heart failure: Secondary | ICD-10-CM | POA: Diagnosis present

## 2016-12-23 DIAGNOSIS — E875 Hyperkalemia: Secondary | ICD-10-CM | POA: Diagnosis present

## 2016-12-23 DIAGNOSIS — K3189 Other diseases of stomach and duodenum: Secondary | ICD-10-CM | POA: Diagnosis present

## 2016-12-23 DIAGNOSIS — E440 Moderate protein-calorie malnutrition: Secondary | ICD-10-CM | POA: Diagnosis present

## 2016-12-23 DIAGNOSIS — I5032 Chronic diastolic (congestive) heart failure: Secondary | ICD-10-CM | POA: Diagnosis present

## 2016-12-23 DIAGNOSIS — K219 Gastro-esophageal reflux disease without esophagitis: Secondary | ICD-10-CM | POA: Diagnosis present

## 2016-12-23 DIAGNOSIS — K7460 Unspecified cirrhosis of liver: Secondary | ICD-10-CM | POA: Diagnosis not present

## 2016-12-23 DIAGNOSIS — I4891 Unspecified atrial fibrillation: Secondary | ICD-10-CM | POA: Diagnosis present

## 2016-12-23 DIAGNOSIS — Z7982 Long term (current) use of aspirin: Secondary | ICD-10-CM

## 2016-12-23 DIAGNOSIS — D649 Anemia, unspecified: Secondary | ICD-10-CM | POA: Diagnosis present

## 2016-12-23 DIAGNOSIS — A419 Sepsis, unspecified organism: Principal | ICD-10-CM | POA: Diagnosis present

## 2016-12-23 DIAGNOSIS — G4730 Sleep apnea, unspecified: Secondary | ICD-10-CM | POA: Diagnosis present

## 2016-12-23 DIAGNOSIS — K5730 Diverticulosis of large intestine without perforation or abscess without bleeding: Secondary | ICD-10-CM | POA: Diagnosis present

## 2016-12-23 DIAGNOSIS — Z9981 Dependence on supplemental oxygen: Secondary | ICD-10-CM

## 2016-12-23 DIAGNOSIS — E119 Type 2 diabetes mellitus without complications: Secondary | ICD-10-CM | POA: Diagnosis present

## 2016-12-23 DIAGNOSIS — Z794 Long term (current) use of insulin: Secondary | ICD-10-CM

## 2016-12-23 DIAGNOSIS — E785 Hyperlipidemia, unspecified: Secondary | ICD-10-CM | POA: Diagnosis present

## 2016-12-23 DIAGNOSIS — D72828 Other elevated white blood cell count: Secondary | ICD-10-CM

## 2016-12-23 DIAGNOSIS — K7031 Alcoholic cirrhosis of liver with ascites: Secondary | ICD-10-CM | POA: Diagnosis present

## 2016-12-23 DIAGNOSIS — J9611 Chronic respiratory failure with hypoxia: Secondary | ICD-10-CM | POA: Diagnosis present

## 2016-12-23 DIAGNOSIS — H905 Unspecified sensorineural hearing loss: Secondary | ICD-10-CM | POA: Diagnosis present

## 2016-12-23 DIAGNOSIS — R1084 Generalized abdominal pain: Secondary | ICD-10-CM | POA: Diagnosis not present

## 2016-12-23 DIAGNOSIS — R509 Fever, unspecified: Secondary | ICD-10-CM

## 2016-12-23 DIAGNOSIS — K766 Portal hypertension: Secondary | ICD-10-CM | POA: Diagnosis present

## 2016-12-23 DIAGNOSIS — N289 Disorder of kidney and ureter, unspecified: Secondary | ICD-10-CM

## 2016-12-23 DIAGNOSIS — I959 Hypotension, unspecified: Secondary | ICD-10-CM | POA: Diagnosis present

## 2016-12-23 DIAGNOSIS — I509 Heart failure, unspecified: Secondary | ICD-10-CM

## 2016-12-23 DIAGNOSIS — Z880 Allergy status to penicillin: Secondary | ICD-10-CM

## 2016-12-23 DIAGNOSIS — Z888 Allergy status to other drugs, medicaments and biological substances status: Secondary | ICD-10-CM

## 2016-12-23 DIAGNOSIS — R188 Other ascites: Secondary | ICD-10-CM

## 2016-12-23 LAB — COMPREHENSIVE METABOLIC PANEL
ALBUMIN: 3.2 g/dL — AB (ref 3.5–5.0)
ALK PHOS: 406 U/L — AB (ref 38–126)
ALT: 54 U/L (ref 17–63)
ANION GAP: 15 (ref 5–15)
AST: 59 U/L — ABNORMAL HIGH (ref 15–41)
BILIRUBIN TOTAL: 1.6 mg/dL — AB (ref 0.3–1.2)
BUN: 40 mg/dL — AB (ref 6–20)
CO2: 22 mmol/L (ref 22–32)
CREATININE: 1.5 mg/dL — AB (ref 0.61–1.24)
Calcium: 9.3 mg/dL (ref 8.9–10.3)
Chloride: 98 mmol/L — ABNORMAL LOW (ref 101–111)
GFR calc Af Amer: 58 mL/min — ABNORMAL LOW (ref >60)
GFR calc non Af Amer: 50 mL/min — ABNORMAL LOW (ref >60)
GLUCOSE: 245 mg/dL — AB (ref 65–99)
Potassium: 5.4 mmol/L — ABNORMAL HIGH (ref 3.5–5.1)
Sodium: 135 mmol/L (ref 135–145)
Total Protein: 6.5 g/dL (ref 6.5–8.1)

## 2016-12-23 LAB — CBC WITH DIFFERENTIAL/PLATELET
BASOS PCT: 0 %
Basophils Absolute: 0 10*3/uL (ref 0.0–0.1)
Eosinophils Absolute: 0 10*3/uL (ref 0.0–0.7)
Eosinophils Relative: 0 %
HEMATOCRIT: 42.8 % (ref 39.0–52.0)
Hemoglobin: 13.9 g/dL (ref 13.0–17.0)
LYMPHS ABS: 0.8 10*3/uL (ref 0.7–4.0)
Lymphocytes Relative: 5 %
MCH: 30.8 pg (ref 26.0–34.0)
MCHC: 32.5 g/dL (ref 30.0–36.0)
MCV: 94.9 fL (ref 78.0–100.0)
MONOS PCT: 13 %
Monocytes Absolute: 2.1 10*3/uL — ABNORMAL HIGH (ref 0.1–1.0)
NEUTROS ABS: 12.7 10*3/uL — AB (ref 1.7–7.7)
NEUTROS PCT: 82 %
Platelets: 514 10*3/uL — ABNORMAL HIGH (ref 150–400)
RBC: 4.51 MIL/uL (ref 4.22–5.81)
RDW: 15.5 % (ref 11.5–15.5)
WBC: 15.7 10*3/uL — ABNORMAL HIGH (ref 4.0–10.5)

## 2016-12-23 LAB — I-STAT CG4 LACTIC ACID, ED: LACTIC ACID, VENOUS: 3.56 mmol/L — AB (ref 0.5–1.9)

## 2016-12-23 LAB — TSH: TSH: 4.296 u[IU]/mL (ref 0.350–4.500)

## 2016-12-23 LAB — AMMONIA: Ammonia: 17 umol/L (ref 9–35)

## 2016-12-23 LAB — GLUCOSE, CAPILLARY: GLUCOSE-CAPILLARY: 170 mg/dL — AB (ref 65–99)

## 2016-12-23 LAB — LIPASE, BLOOD: LIPASE: 18 U/L (ref 11–51)

## 2016-12-23 LAB — ETHANOL: Alcohol, Ethyl (B): <5 mg/dL (ref <5)

## 2016-12-23 MED ORDER — ASPIRIN EC 325 MG PO TBEC
325.0000 mg | DELAYED_RELEASE_TABLET | Freq: Every day | ORAL | Status: DC
  Administered 2016-12-23 – 2016-12-26 (×4): 325 mg via ORAL
  Filled 2016-12-23 (×4): qty 1

## 2016-12-23 MED ORDER — METRONIDAZOLE IN NACL 5-0.79 MG/ML-% IV SOLN
500.0000 mg | Freq: Once | INTRAVENOUS | Status: DC
Start: 2016-12-23 — End: 2016-12-23

## 2016-12-23 MED ORDER — VANCOMYCIN HCL 500 MG IV SOLR
500.0000 mg | Freq: Two times a day (BID) | INTRAVENOUS | Status: DC
  Administered 2016-12-24 (×2): 500 mg via INTRAVENOUS
  Filled 2016-12-23 (×7): qty 500

## 2016-12-23 MED ORDER — CIPROFLOXACIN IN D5W 400 MG/200ML IV SOLN
400.0000 mg | Freq: Two times a day (BID) | INTRAVENOUS | Status: DC
Start: 2016-12-24 — End: 2016-12-26
  Administered 2016-12-24 – 2016-12-26 (×5): 400 mg via INTRAVENOUS
  Filled 2016-12-23 (×4): qty 200

## 2016-12-23 MED ORDER — HEPARIN SODIUM (PORCINE) 5000 UNIT/ML IJ SOLN
5000.0000 [IU] | Freq: Three times a day (TID) | INTRAMUSCULAR | Status: DC
  Administered 2016-12-23 – 2016-12-26 (×8): 5000 [IU] via SUBCUTANEOUS
  Filled 2016-12-23 (×8): qty 1

## 2016-12-23 MED ORDER — INSULIN LISPRO PROT & LISPRO (75-25 MIX) 100 UNIT/ML KWIKPEN: 8.0000 [IU] | PEN_INJECTOR | Freq: Two times a day (BID) | SUBCUTANEOUS | Status: DC

## 2016-12-23 MED ORDER — LEVOTHYROXINE SODIUM 50 MCG PO TABS
50.0000 ug | ORAL_TABLET | Freq: Every day | ORAL | Status: DC
  Administered 2016-12-24 – 2016-12-26 (×3): 50 ug via ORAL
  Filled 2016-12-23 (×3): qty 1

## 2016-12-23 MED ORDER — CIPROFLOXACIN IN D5W 400 MG/200ML IV SOLN: 400.0000 mg | Freq: Two times a day (BID) | INTRAVENOUS | Status: DC

## 2016-12-23 MED ORDER — ALBUMIN HUMAN 25 % IV SOLN
12.5000 g | Freq: Once | INTRAVENOUS | Status: AC
  Administered 2016-12-23: 12.5 g via INTRAVENOUS
  Filled 2016-12-23: qty 50

## 2016-12-23 MED ORDER — INSULIN ASPART 100 UNIT/ML ~~LOC~~ SOLN: 0.0000 [IU] | Freq: Every day | SUBCUTANEOUS | Status: DC

## 2016-12-23 MED ORDER — LEVOFLOXACIN IN D5W 500 MG/100ML IV SOLN: 500.0000 mg | INTRAVENOUS | Status: DC

## 2016-12-23 MED ORDER — ENSURE ENLIVE PO LIQD
237.0000 mL | Freq: Two times a day (BID) | ORAL | Status: DC
  Administered 2016-12-24 – 2016-12-25 (×3): 237 mL via ORAL

## 2016-12-23 MED ORDER — IOPAMIDOL (ISOVUE-300) INJECTION 61%
75.0000 mL | Freq: Once | INTRAVENOUS | Status: AC | PRN
  Administered 2016-12-23: 75 mL via INTRAVENOUS

## 2016-12-23 MED ORDER — METRONIDAZOLE IN NACL 5-0.79 MG/ML-% IV SOLN
500.0000 mg | Freq: Once | INTRAVENOUS | Status: DC
Start: 2016-12-23 — End: 2016-12-23
  Administered 2016-12-23: 500 mg via INTRAVENOUS
  Filled 2016-12-23: qty 100

## 2016-12-23 MED ORDER — ALBUTEROL SULFATE (2.5 MG/3ML) 0.083% IN NEBU: 3.0000 mL | INHALATION_SOLUTION | Freq: Four times a day (QID) | RESPIRATORY_TRACT | Status: DC | PRN

## 2016-12-23 MED ORDER — ACETAMINOPHEN 325 MG PO TABS: 650.0000 mg | ORAL_TABLET | Freq: Four times a day (QID) | ORAL | Status: DC | PRN

## 2016-12-23 MED ORDER — INSULIN ASPART PROT & ASPART (70-30 MIX) 100 UNIT/ML ~~LOC~~ SUSP
8.0000 [IU] | Freq: Two times a day (BID) | SUBCUTANEOUS | Status: DC
  Administered 2016-12-24 – 2016-12-26 (×5): 8 [IU] via SUBCUTANEOUS
  Filled 2016-12-23: qty 10

## 2016-12-23 MED ORDER — ALBUTEROL SULFATE (2.5 MG/3ML) 0.083% IN NEBU: 2.5000 mg | INHALATION_SOLUTION | Freq: Four times a day (QID) | RESPIRATORY_TRACT | Status: DC | PRN

## 2016-12-23 MED ORDER — PANTOPRAZOLE SODIUM 40 MG PO TBEC
40.0000 mg | DELAYED_RELEASE_TABLET | Freq: Every day | ORAL | Status: DC
Start: 2016-12-23 — End: 2016-12-26
  Administered 2016-12-23 – 2016-12-26 (×4): 40 mg via ORAL
  Filled 2016-12-23 (×4): qty 1

## 2016-12-23 MED ORDER — ALBUMIN HUMAN 5 % IV SOLN
12.5000 g | Freq: Once | INTRAVENOUS | Status: DC
  Filled 2016-12-23: qty 250

## 2016-12-23 MED ORDER — SODIUM POLYSTYRENE SULFONATE 15 GM/60ML PO SUSP
30.0000 g | Freq: Once | ORAL | Status: AC
  Administered 2016-12-23: 30 g via ORAL
  Filled 2016-12-23: qty 120

## 2016-12-23 MED ORDER — LACTULOSE 10 GM/15ML PO SOLN
10.0000 g | Freq: Two times a day (BID) | ORAL | Status: DC
  Administered 2016-12-23 – 2016-12-26 (×6): 10 g via ORAL
  Filled 2016-12-23 (×6): qty 30

## 2016-12-23 MED ORDER — LEVOFLOXACIN IN D5W 750 MG/150ML IV SOLN: 750.0000 mg | Freq: Once | INTRAVENOUS | Status: DC

## 2016-12-23 MED ORDER — SODIUM CHLORIDE 0.9 % IV SOLN
1000.0000 mL | INTRAVENOUS | Status: DC
  Administered 2016-12-23: 1000 mL via INTRAVENOUS

## 2016-12-23 MED ORDER — MODAFINIL 200 MG PO TABS
200.0000 mg | ORAL_TABLET | Freq: Every day | ORAL | Status: DC
  Administered 2016-12-24 – 2016-12-26 (×3): 200 mg via ORAL
  Filled 2016-12-23 (×3): qty 1

## 2016-12-23 MED ORDER — CIPROFLOXACIN IN D5W 400 MG/200ML IV SOLN
INTRAVENOUS | Status: AC
  Filled 2016-12-23: qty 200

## 2016-12-23 MED ORDER — SODIUM CHLORIDE 0.9 % IV SOLN
1000.0000 mL | Freq: Once | INTRAVENOUS | Status: AC
  Administered 2016-12-23: 1000 mL via INTRAVENOUS

## 2016-12-23 MED ORDER — LEVOFLOXACIN IN D5W 750 MG/150ML IV SOLN
750.0000 mg | Freq: Once | INTRAVENOUS | Status: DC
  Administered 2016-12-23: 750 mg via INTRAVENOUS
  Filled 2016-12-23: qty 150

## 2016-12-23 MED ORDER — VANCOMYCIN HCL IN DEXTROSE 1-5 GM/200ML-% IV SOLN
1000.0000 mg | Freq: Once | INTRAVENOUS | Status: AC
  Administered 2016-12-23: 1000 mg via INTRAVENOUS
  Filled 2016-12-23: qty 200

## 2016-12-23 MED ORDER — URSODIOL 500 MG PO TABS
500.0000 mg | ORAL_TABLET | Freq: Two times a day (BID) | ORAL | Status: DC
  Filled 2016-12-23: qty 1

## 2016-12-23 MED ORDER — INSULIN ASPART 100 UNIT/ML ~~LOC~~ SOLN
0.0000 [IU] | Freq: Three times a day (TID) | SUBCUTANEOUS | Status: DC
  Administered 2016-12-24 (×2): 1 [IU] via SUBCUTANEOUS
  Administered 2016-12-24 – 2016-12-25 (×3): 2 [IU] via SUBCUTANEOUS

## 2016-12-23 NOTE — ED Provider Notes (Addendum)
[facility] DEPT Provider Note   CSN: [id number] Arrival date & time: 12/23/16  4098   By signing my name below, I, Hilbert Odor, attest that this documentation has been prepared under the direction and in the presence of Noemi Chapel, MD. Electronically Signed: Hilbert Odor, Scribe. 12/23/16. 9:40 AM. History   Chief Complaint Chief Complaint  Patient presents with  . Abdominal Pain     The history is provided by the patient. No language interpreter was used.  HPI Comments: [name] is a 56 y.o. male brought in by ambulance, who presents to the Emergency Department complaining of abdominal pain. Per medical records: He has a hx of combination of alcoholic and gnash cirrhosis along with multiple comorbidities related to his heart including: CHF, atrial flutter, and aortic stenosis as well as COPD and diabetes. He is on 4 liters of oxygen 24/7. Patient was here yesterday and had a paracentesis with 4.3 liters of red colored ascites removed at that time. No labs are in the system to evaluate the fluid. He has been experiencing abdominal pain ever since. He has paracentesis scheduled for 12/29/2016. The procedure he was supposed to have on 12/14/2016 was tips, but they were unalbe to perform the tips successfully due to his venous anatomy. He was categorized for poor candidate for tips placement going forward. He also reports some leg swelling and dizziness. The patient is currently on 4L of oxygen at home for hx of COPD. He denies drinking EtOH. He is ambulatory with a walker. He reports no other symptoms.   Past Medical History:  Diagnosis Date  . Alcohol use   . Anemia   . Aortic stenosis    mild  . Arthritis   . Atrial flutter ([place])   . Benign hypertrophy of prostate    with urinary retention; repaired hypospadia; transurethral resection of the prostate   . Cardiac conduction disorder    status post pacemaker implantation in 1995 generator replaced 2005  . Chest pain  2007, 2009   cardiac cath > normal coronary arterie; nl left ventricular function  . CHF (congestive heart failure) ([place])   . Cirrhosis ([place]) 11/2016  . Congenital deafness   . COPD (chronic obstructive pulmonary disease) ([place])   . Diabetes mellitus    +Insulin  . Dyspnea   . Edema   . GERD (gastroesophageal reflux disease)    status post multiple dilatations for stricture  . 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 Hepatitis    Thinks it was B  . History of kidney stones   . HTN (hypertension)   . Hyperlipidemia    statin discontinued due to abn LFTs  . Hypothyroidism   . Mitral regurgitation    insignificant  . Obesity   . Obesity 4/08   BMI= 40  . Pelvic mass    identified in 2009, stable 2010  . PONV (postoperative nausea and vomiting)   . Presence of permanent cardiac pacemaker   . Sleep apnea    BiPAP and continuous oxygen  . Syncope   . Thyroid disease     Patient Active Problem List   Diagnosis Date Noted  . Cirrhosis ([place]) 12/14/2016  . Anemia 11/11/2016  . Lactic acidosis 11/11/2016  . AKI (acute kidney injury) ([place]) 11/11/2016  . Hyperkalemia 11/11/2016  . Hypothyroidism 07/15/2016  . Elevated alkaline phosphatase level 06/14/2016  . Elevated LFTs 06/14/2016  . Ascites   . Diarrhea  02/29/2016  . Abnormal liver function 02/29/2016  . Atrial flutter ([place]) 02/29/2016  . Hypoglycemia 02/29/2016  . Abdominal pain 02/29/2016  . Jejunitis   . Adenomatous colon polyp 09/01/2015  . Portal hypertensive gastropathy   . Heme + stool 06/02/2015  . Leukocytosis, unspecified 10/26/2013  . Hematuria 10/26/2013  . UTI (lower urinary tract infection) 10/25/2013  . HTN (hypertension)   . Atrial fibrillation ([place]) 10/19/2013  . Chronic diastolic heart failure ([place]) 09/19/2013  . Chest pain   . Aortic stenosis   . Hypertension   . Hyperlipidemia   . Pelvic mass   . Cirrhosis of liver with ascites  ([place])   . Benign hypertrophy of prostate   . Sleep apnea   . GASTROESOPHAGEAL REFLUX DISEASE 05/19/2010  . Cardiac pacemaker in situ 03/18/2010  . Type 2 diabetes mellitus ([place]) 06/27/2008  . ANEMIA, MILD 06/27/2008  . Deafness congenital 06/27/2008  . CONDUCTION DISORDER OF THE HEART 06/27/2008    Past Surgical History:  Procedure Laterality Date  . A-V CARDIAC PACEMAKER INSERTION  1995  . COLONOSCOPY  2008  . COLONOSCOPY N/A 06/23/2015   RXV:QMGQQPY diverticulosis  . ESOPHAGOGASTRODUODENOSCOPY N/A 06/23/2015   PPJ:KDTO portal gastropathy  . IR GENERIC HISTORICAL  12/14/2016   IR PARACENTESIS 12/14/2016 Sandi Mariscal, MD [facility]INTER[facility]  . IR GENERIC HISTORICAL  12/14/2016   IR TIPS 12/14/2016 Sandi Mariscal, MD [facility]  . NASAL SINUS SURGERY     partially removed  . ORCHIECTOMY  1990   bilateral; ?neoplasm  . PACEMAKER INSERTION  05/2004; 10/04/2013   MDT dual chamber pacemaker; gen change 10/04/2013 (MDT ADDRL1)  . PERMANENT PACEMAKER GENERATOR CHANGE N/A 10/04/2013   Procedure: PERMANENT PACEMAKER GENERATOR CHANGE;  Surgeon: Evans Lance, MD;  Location: [facility] C[facility] LAB;  Service: Cardiovascular;  Laterality: N/A;  . RADIOLOGY WITH ANESTHESIA N/A 12/14/2016   Procedure: TIPS;  Surgeon: Sandi Mariscal, MD;  Location: [facility];  Service: Radiology;  Laterality: N/A;  . REPAIR HYPOSPADIAS W/ URETHROPLASTY    . TONSILLECTOMY AND ADENOIDECTOMY    . TRANSURETHRAL RESECTION OF PROSTATE     for BPH       Home Medications    Prior to Admission medications   Medication Sig Start Date End Date Taking? Authorizing Provider  acetaminophen (TYLENOL) 325 MG tablet Take 650 mg by mouth every 6 (six) hours as needed for mild pain.   Yes Historical Provider, MD  albuterol (PROVENTIL HFA;VENTOLIN HFA) 108 (90 Base) MCG/ACT inhaler Inhale 2 puffs into the lungs every 6 (six) hours as needed for wheezing or shortness of breath.   Yes Historical Provider, MD  albuterol (PROVENTIL) (2.5 MG/3ML) 0.083% nebulizer  solution Take 2.5 mg by nebulization every 6 (six) hours as needed for wheezing or shortness of breath.   Yes Historical Provider, MD  aspirin 325 MG EC tablet Take 325 mg by mouth daily.   Yes Historical Provider, MD  cetirizine (ZYRTEC) 10 MG tablet Take 10 mg by mouth daily.   Yes Historical Provider, MD  furosemide (LASIX) 20 MG tablet Take 1 tablet (20 mg total) by mouth daily. Take along with spironolactone 11/22/16  Yes Annitta Needs, NP  gentamicin ointment (GARAMYCIN) 0.1 % Apply 1 application topically at bedtime. Inside nose    Yes Historical Provider, MD  Glucosamine-Chondroit-Vit C-Mn (GLUCOSAMINE CHONDR 1500 COMPLX PO) Take 1 tablet by mouth every morning.   Yes Historical Provider, MD  HUMALOG MIX 75/25 KWIKPEN (75-25) 100 UNIT/ML Kwikpen Inject 8 Units into the skin  2 (two) times daily. 15 units in the morning and 15 units in the evening Patient taking differently: Inject 8 Units into the skin 2 (two) times daily.  11/14/16  Yes Eber Jones, MD  lactulose [facility]) 10 GM/15ML solution 10cc (2 teaspoons) bid; Titrate to 2-3 semi formed stools a day Patient taking differently: Take 10 g by mouth 2 (two) times daily. 10cc (2 teaspoons) bid; Titrate to 2-3 semi formed stools a day 11/14/16  Yes Eber Jones, MD  levothyroxine (LEVOXYL) 50 MCG tablet Take 50 mcg by mouth daily before breakfast.    Yes Historical Provider, MD  metoprolol succinate (TOPROL-XL) 25 MG 24 hr tablet Take 25 mg by mouth daily.   Yes Historical Provider, MD  modafinil (PROVIGIL) 200 MG tablet Take 200 mg by mouth daily.  01/13/15  Yes Historical Provider, MD  mometasone (NASONEX) 50 MCG/ACT nasal spray Place 2 sprays into the nose daily as needed (FOR CONGESTION/ALLERGIES).   Yes Historical Provider, MD  Multiple Vitamins-Minerals (CENTRUM) tablet Take 1 tablet by mouth daily.     Yes Historical Provider, MD  nitroGLYCERIN (NITROSTAT) 0.4 MG SL tablet Place 1 tablet (0.4 mg total) under the tongue  every 5 (five) minutes as needed for chest pain. 06/26/15  Yes Lendon Colonel, NP  NON FORMULARY Bipap 16.0/e   Yes Historical Provider, MD  Omega-3 Fatty Acids (FISH OIL) 1000 MG CAPS Take 1 capsule by mouth every other day.    Yes Historical Provider, MD  omeprazole (PRILOSEC) 40 MG capsule TAKE 1 CAPSULE BY MOUTH ONCE DAILY FOR REFLUX. 12/15/16  Yes Arnoldo Lenis, MD  spironolactone (ALDACTONE) 50 MG tablet TAKE 1 TABLET BY MOUTH ONCE DAILY. 12/15/16  Yes Arnoldo Lenis, MD  ursodiol (ACTIGALL) 500 MG tablet TAKE 1 TABLET BY MOUTH TWICE DAILY 08/11/16  Yes Carlis Stable, NP    Family History Family History  Problem Relation Age of Onset  . COPD Mother   . Hypertension Mother   . Congestive Heart Failure Mother   . Pneumonia Father   . Heart disease Other   . Hypertension Other   . Colon cancer Neg Hx     Social History Social History  Substance Use Topics  . Smoking status: Never Smoker  . Smokeless tobacco: Never Used     Comment: tobacco use- no   . Alcohol use No     Comment: Hx. of alcohol use     Allergies   Enalapril; Metformin and related; Eliquis [apixaban]; and Penicillins   Review of Systems Review of Systems  Constitutional: Negative for fever.  Respiratory: Negative for shortness of breath.   Cardiovascular: Positive for leg swelling. Negative for chest pain.  Gastrointestinal: Positive for abdominal pain.  Neurological: Positive for dizziness.  All other systems reviewed and are negative.    Physical Exam Updated Vital Signs BP 95/67   Pulse 90   Temp 99.1 F (37.3 C) (Rectal)   Resp 18   Ht 5\' 8"  (1.727 m)   Wt 140 lb (63.5 kg)   SpO2 94%   BMI 21.29 kg/m   Physical Exam  Constitutional: He is oriented to person, place, and time. He appears well-developed and well-nourished. No distress.  HENT:  Head: Normocephalic and atraumatic.  Eyes: Conjunctivae are normal.  Disconjugate gaze.  Neck: Neck supple. No tracheal deviation  present.  Cardiovascular:  Mild tachycardia.  Pulmonary/Chest: Effort normal. No respiratory distress.  Clear lungs.  Abdominal: Soft. There is tenderness. There is  no guarding.  Very soft but diffusely tender abdomen. No guarding.  Musculoskeletal: Normal range of motion. He exhibits no edema.  No edema of legs.  Neurological: He is alert and oriented to person, place, and time.  Skin: Skin is warm and dry.  Psychiatric: He has a normal mood and affect. His behavior is normal.  Nursing note and vitals reviewed.   ED Treatments / Results  DIAGNOSTIC STUDIES: Oxygen Saturation is 92% on [place], low by my interpretation.  COORDINATION OF CARE: 9:19 AM Discussed treatment plan with pt at bedside and pt agreed to plan. I will check the patient's CT scan.  Labs (all labs ordered are listed, but only abnormal results are displayed) Labs Reviewed  CBC WITH DIFFERENTIAL/PLATELET - Abnormal; Notable for the following:       Result Value   WBC 15.7 (*)    Platelets 514 (*)    Neutro Abs 12.7 (*)    Monocytes Absolute 2.1 (*)    All other components within normal limits  COMPREHENSIVE METABOLIC PANEL - Abnormal; Notable for the following:    Potassium 5.4 (*)    Chloride 98 (*)    Glucose, Bld 245 (*)    BUN 40 (*)    Creatinine, Ser 1.50 (*)    Albumin 3.2 (*)    AST 59 (*)    Alkaline Phosphatase 406 (*)    Total Bilirubin 1.6 (*)    GFR calc non Af Amer 50 (*)    GFR calc Af Amer 58 (*)    All other components within normal limits  LIPASE, BLOOD  ETHANOL  AMMONIA    EKG  EKG Interpretation  Date/Time:  Thursday December 23 2016 16:48:13 EDT Ventricular Rate:  92 PR Interval:    QRS Duration: 140 QT Interval:  441 QTC Calculation: 519 R Axis:   -78 Text Interpretation:  Atrial flutter with varied AV block, Nonspecific IVCD with LAD Inferior infarct, old Probable anterior infarct, age indeterminate since last tracing no significant change Confirmed by Yancy Hascall  MD, Orvell Careaga  [phone]) on 12/23/2016 4:56:47 PM       Radiology Ct Abdomen Pelvis W Contrast  Result Date: 12/23/2016 CLINICAL DATA:  Acute generalized abdominal pain. Hepatic cirrhosis. EXAM: CT ABDOMEN AND PELVIS WITH CONTRAST TECHNIQUE: Multidetector CT imaging of the abdomen and pelvis was performed using the standard protocol following bolus administration of intravenous contrast. CONTRAST:  18mL ISOVUE-300 IOPAMIDOL (ISOVUE-300) INJECTION 61% COMPARISON:  CT scan of November 11, 2016. FINDINGS: Lower chest: No acute abnormality. Hepatobiliary: Hepatic cirrhosis is noted.  No gallstones are noted. Pancreas: Unremarkable. No pancreatic ductal dilatation or surrounding inflammatory changes. Spleen: Normal in size without focal abnormality. Adrenals/Urinary Tract: Adrenal glands are unremarkable. No hydronephrosis or renal obstruction is noted. Urinary bladder appears normal. Moderate right renal cortical scarring and atrophy is noted. Left kidney appears normal. Stomach/Bowel: Stomach is within normal limits. Appendix appears normal. No evidence of bowel wall thickening, distention, or inflammatory changes. Vascular/Lymphatic: Aortic atherosclerosis. No enlarged abdominal or pelvic lymph nodes. Reproductive: Prostate is unremarkable. Other: No hernia is noted.  Moderate ascites is noted. Musculoskeletal: No acute or significant osseous findings. IMPRESSION: Hepatic cirrhosis. Moderate ascites. Aortic atherosclerosis. No other definite abnormality seen in the abdomen or pelvis. Electronically Signed   By: Marijo Conception, M.D.   On: 12/23/2016 11:31   Procedures Procedures (including critical care time)  Medications Ordered in ED Medications  sodium polystyrene (KAYEXALATE) 15 GM/60ML suspension 30 g (not administered)  iopamidol (ISOVUE-300) 61 %  injection 75 mL (75 mLs Intravenous Contrast Given 12/23/16 1105)  0.9 %  sodium chloride infusion (1,000 mLs Intravenous New Bag/Given 12/23/16 1454)    Initial  Impression / Assessment and Plan / ED Course  I have reviewed the triage vital signs and the nursing notes.  Pertinent labs & imaging results that were available during my care of the patient were reviewed by me and considered in my medical decision making (see chart for details).   The pt has had abnormal labs with a K of 5.4 and a Cr of 1.5, The patient has had a blood pressure of approximately 08-676 systolic, there has been no tachycardia and after undergoing CT scan and blood work the patient's abdominal pain has completely resolved. The patient was not even given abdominal pain medication or nausea medication and his pain was totally resolved. On repeat exam twice over the course of several hours the patient had a repeat abdominal exam which had no abdominal tenderness or guarding. Seeing that the patient is improved significantly in the absence of a fever or any significant findings on CT scan other than his known ascites I suspect that his abdominal pain was not related to a perforated viscus, however I cannot explain the mild hypotension, hyperkalemia or slight renal insufficiency. I had a long discussion with the patient and his significant other about staying in the hospital. Ultimately they requested to be discharged home to return if symptoms would worsen despite my pleading to stay. His blood pressure was close to 195 systolic, I gave him 093 mL bag of fluid to help with some of his symptoms fully understanding that this may cause worsening peripheral edema or ascites. As the patient undergoes frequent assessments and paracentesis for this purpose I deemed this a responsible intervention given his borderline blood pressure. The family member also reports that the gastroenterologist has recently stopped for the blood pressure medications because of hypotension. At this time I have discussed with the patient and the family member at length regarding the indications for return, they've expressed  their understanding and they're insistence to be discharged home. They will return should symptoms worsen  The pt changed his mind as he became more hypotensive and prior to d/c was able to convince him to stay.  D/w Dr. Marin Comment wo will admit Tx as sepsis Only one set of cultures was able to be obtained prior to antibiotis  Sepsis - Repeat Assessment  Performed at:    4:37 PM   Vitals     Blood pressure 102/80, pulse 85, temperature 99.1 F (37.3 C), temperature source Rectal, resp. rate 18, height 5\' 8"  (1.727 m), weight 140 lb (63.5 kg), SpO2 97 %.  Heart:     Regular rate and rhythm  Lungs:    CTA  Capillary Refill:   <2 sec  Peripheral Pulse:   Radial pulse palpable  Skin:     Normal Color     CRITICAL CARE Performed by: Johnna Acosta Total critical care time: 35 minutes Critical care time was exclusive of separately billable procedures and treating other patients. Critical care was necessary to treat or prevent imminent or life-threatening deterioration. Critical care was time spent personally by me on the following activities: development of treatment plan with patient and/or surrogate as well as nursing, discussions with consultants, evaluation of patient's response to treatment, examination of patient, obtaining history from patient or surrogate, ordering and performing treatments and interventions, ordering and review of laboratory studies, ordering and review  of radiographic studies, pulse oximetry and re-evaluation of patient's condition.   Vitals:   12/23/16 1230 12/23/16 1300 12/23/16 1330 12/23/16 1430  BP: 96/65 96/76 105/77 95/67  Pulse: 95 94 87 90  Resp:    18  Temp:      TempSrc:      SpO2: 98% 98% 98% 94%  Weight:      Height:         Final Clinical Impressions(s) / ED Diagnoses   Final diagnoses:  Generalized abdominal pain  Other elevated white blood cell (WBC) count  Hyperkalemia  Renal insufficiency  Sepsis, due to unspecified organism  [facility])    New Prescriptions New Prescriptions   No medications on file   I personally performed the services described in this documentation, which was scribed in my presence. The recorded information has been reviewed and is accurate.       Noemi Chapel, MD 12/23/16 [place], MD 12/23/16 [place], MD 12/23/16 [phone]

## 2016-12-23 NOTE — Progress Notes (Signed)
Pharmacy Antibiotic Note  [name] is a 56 y.o. male admitted on 12/23/2016 with early  sepsis.  Pharmacy has been consulted for Vancomycin dosing.  Plan: Vancomycin 1000mg  loading dose then 500mg   IV every 12 hours.  Goal trough 15-20 mcg/mL.  Cipro 400mg  IV q12h F/U cxs and clinical progress Monitor V/S, labs, and levels as indicated  Height: 5\' 3"  (160 cm) Weight: 140 lb 10.5 oz (63.8 kg) IBW/kg (Calculated) : 56.9  Temp (24hrs), Avg:98 F (36.7 C), Min:97.5 F (36.4 C), Max:99.1 F (37.3 C)   Recent Labs Lab 12/23/16 0952 12/23/16 1639  WBC 15.7*  --   CREATININE 1.50*  --   LATICACIDVEN  --  3.56*    Estimated Creatinine Clearance: 44.3 mL/min (A) (by C-G formula based on SCr of 1.5 mg/dL (H)).    Allergies  Allergen Reactions  . Enalapril Cough  . Metformin And Related Diarrhea  . Eliquis [Apixaban] Other (See Comments)    DOSE RELATED BLEEDING  . Penicillins Other (See Comments)    Reaction unspecified/childhood allergy Has patient had a PCN reaction causing immediate rash, facial/tongue/throat swelling, SOB or lightheadedness with hypotension: Yes Has patient had a PCN reaction causing severe rash involving mucus membranes or skin necrosis: No Has patient had a PCN reaction that required hospitalization No Has patient had a PCN reaction occurring within the last 10 years: No If all of the above answers are "NO", then may proceed with Cephalosporin use.    Antimicrobials this admission: Vancomycin 4/5 >>  Cipro 4/6 >>  Levaquin and flagyl x 1 dose in ED 4/5  Dose adjustments this admission: N/A  Microbiology results: 4/5 BCx: pending 4/5 UCx: pending  Thank you for allowing pharmacy to be a part of this patient's care.  Isac Sarna, BS [place], [place] Clinical Pharmacist Pager [phone] 12/23/2016 8:59 PM

## 2016-12-23 NOTE — ED Notes (Signed)
Per Dr. Marin Comment, continue to give initial dose of Levaquin and Flagyl x 1 dose and then start Cipro order.

## 2016-12-23 NOTE — Telephone Encounter (Signed)
Noted. Will keep an eye out for the ER visit.

## 2016-12-23 NOTE — H&P (Signed)
History and Physical    [name] LPF:[id number] DOB: [birth year] DOA: 12/23/2016  PCP: Petra Kuba, MD  Patient coming from: Home.    Chief Complaint:  Abdominal pain.  HPI:  Nicholas Kimball Robertsonis a 56 y.o.malewith medical history significant of etoh use, aortic stenosis, mitral regurgitation, atrial flutter/fibrillation, history of pacemaker placement in 1995/2005/2015, BPH/TURP, COPD, congenital deafness, type 2 diabetes, GERD, liver cirrhosis, ascites, hypertension, hyperlipidemia, sleep apnea, hypothyroidism, recent failed TIPS procedure, and recent LVP, presented to the ER with intermittent abdominal discomfort.  He has LVP via IR about every 2 weeks.  In the ER, he was found to have hypotension, but responded a little to IVF, and his lactic acid was elevated to 3.  He has Cr of 1.5, and leukocytosis with WBC of 15K.  He was given IV levoquin and IV Flagyl, and hospitalist was asked to admit him for fever of unclear source.    ED Course:  See above.  Rewiew of Systems:  Constitutional:  No significant weight loss or weight gain Eyes: Negative for eye pain, redness and discharge, diplopia, visual changes, or flashes of light. ENMT: Negative for ear pain, hoarseness, nasal congestion, sinus pressure and sore throat. No headaches; tinnitus, drooling, or problem swallowing. Cardiovascular: Negative for chest pain, palpitations, diaphoresis, dyspnea and peripheral edema. ; No orthopnea, PND Respiratory: Negative for cough, hemoptysis, wheezing and stridor. No pleuritic chestpain. Gastrointestinal: Negative for diarrhea, constipation,  melena, blood in stool, hematemesis, jaundice and rectal bleeding.    Genitourinary: Negative for frequency, dysuria, incontinence,flank pain and hematuria; Musculoskeletal: Negative for back pain and neck pain. Negative for swelling and trauma.;  Skin: . Negative for pruritus, rash, abrasions, bruising and skin lesion.; ulcerations Neuro: Negative  for headache, lightheadedness and neck stiffness. Negative for weakness, altered level of consciousness , altered mental status, extremity weakness, burning feet, involuntary movement, seizure and syncope.  Psych: negative for anxiety, depression, insomnia, tearfulness, panic attacks, hallucinations, paranoia, suicidal or homicidal ideation    Past Medical History:  Diagnosis Date  . Alcohol use   . Anemia   . Aortic stenosis    mild  . Arthritis   . Atrial flutter ([place])   . Benign hypertrophy of prostate    with urinary retention; repaired hypospadia; transurethral resection of the prostate   . Cardiac conduction disorder    status post pacemaker implantation in 1995 generator replaced 2005  . Chest pain 2007, 2009   cardiac cath > normal coronary arterie; nl left ventricular function  . CHF (congestive heart failure) ([place])   . Cirrhosis ([employer]) 11/2016  . Congenital deafness   . COPD (chronic obstructive pulmonary disease) ([place])   . Diabetes mellitus    +Insulin  . Dyspnea   . Edema   . GERD (gastroesophageal reflux disease)    status post multiple dilatations for stricture  . 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 Hepatitis    Thinks it was B  . History of kidney stones   . HTN (hypertension)   . Hyperlipidemia    statin discontinued due to abn LFTs  . Hypothyroidism   . Mitral regurgitation    insignificant  . Obesity   . Obesity 4/08   BMI= 40  . Pelvic mass    identified in 2009, stable 2010  . PONV (postoperative nausea and vomiting)   . Presence of permanent cardiac pacemaker   . Sleep apnea    BiPAP and  continuous oxygen  . Syncope   . Thyroid disease     Past Surgical History:  Procedure Laterality Date  . A-V CARDIAC PACEMAKER INSERTION  1995  . COLONOSCOPY  2008  . COLONOSCOPY N/A 06/23/2015   URK:YHCWCBJ diverticulosis  . ESOPHAGOGASTRODUODENOSCOPY N/A 06/23/2015   SEG:BTDV portal  gastropathy  . IR GENERIC HISTORICAL  12/14/2016   IR PARACENTESIS 12/14/2016 Sandi Mariscal, MD [facility]INTER[facility]  . IR GENERIC HISTORICAL  12/14/2016   IR TIPS 12/14/2016 Sandi Mariscal, MD [facility]  . NASAL SINUS SURGERY     partially removed  . ORCHIECTOMY  1990   bilateral; ?neoplasm  . PACEMAKER INSERTION  05/2004; 10/04/2013   MDT dual chamber pacemaker; gen change 10/04/2013 (MDT ADDRL1)  . PERMANENT PACEMAKER GENERATOR CHANGE N/A 10/04/2013   Procedure: PERMANENT PACEMAKER GENERATOR CHANGE;  Surgeon: Evans Lance, MD;  Location: [facility] CATH LAB;  Service: Cardiovascular;  Laterality: N/A;  . RADIOLOGY WITH ANESTHESIA N/A 12/14/2016   Procedure: TIPS;  Surgeon: Sandi Mariscal, MD;  Location: [place];  Service: Radiology;  Laterality: N/A;  . REPAIR HYPOSPADIAS W/ URETHROPLASTY    . TONSILLECTOMY AND ADENOIDECTOMY    . TRANSURETHRAL RESECTION OF PROSTATE     for BPH     reports that he has never smoked. He has never used smokeless tobacco. He reports that he does not drink alcohol or use drugs.  Allergies  Allergen Reactions  . Enalapril Cough  . Metformin And Related Diarrhea  . Eliquis [Apixaban] Other (See Comments)    DOSE RELATED BLEEDING  . Penicillins Other (See Comments)    Reaction unspecified/childhood allergy Has patient had a PCN reaction causing immediate rash, facial/tongue/throat swelling, SOB or lightheadedness with hypotension: Yes Has patient had a PCN reaction causing severe rash involving mucus membranes or skin necrosis: No Has patient had a PCN reaction that required hospitalization No Has patient had a PCN reaction occurring within the last 10 years: No If all of the above answers are "NO", then may proceed with Cephalosporin use.    Family History  Problem Relation Age of Onset  . COPD Mother   . Hypertension Mother   . Congestive Heart Failure Mother   . Pneumonia Father   . Heart disease Other   . Hypertension Other   . Colon cancer Neg Hx      Prior to  Admission medications   Medication Sig Start Date End Date Taking? Authorizing Provider  acetaminophen (TYLENOL) 325 MG tablet Take 650 mg by mouth every 6 (six) hours as needed for mild pain.   Yes Historical Provider, MD  albuterol (PROVENTIL HFA;VENTOLIN HFA) 108 (90 Base) MCG/ACT inhaler Inhale 2 puffs into the lungs every 6 (six) hours as needed for wheezing or shortness of breath.   Yes Historical Provider, MD  albuterol (PROVENTIL) (2.5 MG/3ML) 0.083% nebulizer solution Take 2.5 mg by nebulization every 6 (six) hours as needed for wheezing or shortness of breath.   Yes Historical Provider, MD  aspirin 325 MG EC tablet Take 325 mg by mouth daily.   Yes Historical Provider, MD  cetirizine (ZYRTEC) 10 MG tablet Take 10 mg by mouth daily.   Yes Historical Provider, MD  furosemide (LASIX) 20 MG tablet Take 1 tablet (20 mg total) by mouth daily. Take along with spironolactone 11/22/16  Yes Annitta Needs, NP  gentamicin ointment (GARAMYCIN) 0.1 % Apply 1 application topically at bedtime. Inside nose    Yes Historical Provider, MD  Glucosamine-Chondroit-Vit C-Mn (GLUCOSAMINE CHONDR [place]  PO) Take 1 tablet by mouth every morning.   Yes Historical Provider, MD  HUMALOG MIX 75/25 KWIKPEN (75-25) 100 UNIT/ML Kwikpen Inject 8 Units into the skin 2 (two) times daily. 15 units in the morning and 15 units in the evening Patient taking differently: Inject 8 Units into the skin 2 (two) times daily.  11/14/16  Yes Eber Jones, MD  lactulose [facility]) 10 GM/15ML solution 10cc (2 teaspoons) bid; Titrate to 2-3 semi formed stools a day Patient taking differently: Take 10 g by mouth 2 (two) times daily. 10cc (2 teaspoons) bid; Titrate to 2-3 semi formed stools a day 11/14/16  Yes Eber Jones, MD  levothyroxine (LEVOXYL) 50 MCG tablet Take 50 mcg by mouth daily before breakfast.    Yes Historical Provider, MD  metoprolol succinate (TOPROL-XL) 25 MG 24 hr tablet Take 25 mg by mouth daily.   Yes  Historical Provider, MD  modafinil (PROVIGIL) 200 MG tablet Take 200 mg by mouth daily.  01/13/15  Yes Historical Provider, MD  mometasone (NASONEX) 50 MCG/ACT nasal spray Place 2 sprays into the nose daily as needed (FOR CONGESTION/ALLERGIES).   Yes Historical Provider, MD  Multiple Vitamins-Minerals (CENTRUM) tablet Take 1 tablet by mouth daily.     Yes Historical Provider, MD  nitroGLYCERIN (NITROSTAT) 0.4 MG SL tablet Place 1 tablet (0.4 mg total) under the tongue every 5 (five) minutes as needed for chest pain. 06/26/15  Yes Lendon Colonel, NP  NON FORMULARY Bipap 16.0/e   Yes Historical Provider, MD  Omega-3 Fatty Acids (FISH OIL) 1000 MG CAPS Take 1 capsule by mouth every other day.    Yes Historical Provider, MD  omeprazole (PRILOSEC) 40 MG capsule TAKE 1 CAPSULE BY MOUTH ONCE DAILY FOR REFLUX. 12/15/16  Yes Arnoldo Lenis, MD  spironolactone (ALDACTONE) 50 MG tablet TAKE 1 TABLET BY MOUTH ONCE DAILY. 12/15/16  Yes Arnoldo Lenis, MD  ursodiol (ACTIGALL) 500 MG tablet TAKE 1 TABLET BY MOUTH TWICE DAILY 08/11/16  Yes Carlis Stable, NP    Physical Exam: Vitals:   12/23/16 1330 12/23/16 1400 12/23/16 1430 12/23/16 1500  BP: 105/77 101/68 95/67 102/80  Pulse: 87 (!) 101 90 85  Resp:   18   Temp:      TempSrc:      SpO2: 98% 98% 94% 97%  Weight:      Height:          Constitutional: NAD, calm, comfortable Vitals:   12/23/16 1330 12/23/16 1400 12/23/16 1430 12/23/16 1500  BP: 105/77 101/68 95/67 102/80  Pulse: 87 (!) 101 90 85  Resp:   18   Temp:      TempSrc:      SpO2: 98% 98% 94% 97%  Weight:      Height:       Eyes: PERRL, lids and conjunctivae normal ENMT: Mucous membranes are moist. Posterior pharynx clear of any exudate or lesions.Normal dentition.  Neck: normal, supple, no masses, no thyromegaly Respiratory: clear to auscultation bilaterally, no wheezing, no crackles. Normal respiratory effort. No accessory muscle use.  Cardiovascular: Regular rate and rhythm,  no murmurs / rubs / gallops. No extremity edema. 2+ pedal pulses. No carotid bruits.  Abdomen: no tenderness, no masses palpated. No hepatosplenomegaly. Bowel sounds positive.  Musculoskeletal: no clubbing / cyanosis. No joint deformity upper and lower extremities. Good ROM, no contractures. Normal muscle tone.  Skin: no rashes, lesions, ulcers. No induration Neurologic: CN 2-12 grossly intact. Sensation intact, DTR normal. Strength  5/5 in all 4.  Psychiatric: Normal judgment and insight. Alert and oriented x 3. Normal mood.     Labs on Admission: I have personally reviewed following labs and imaging studies  CBC:  Recent Labs Lab 12/23/16 0952  WBC 15.7*  NEUTROABS 12.7*  HGB 13.9  HCT 42.8  MCV 94.9  PLT 570*   Basic Metabolic Panel:  Recent Labs Lab 12/23/16 0952  NA 135  K 5.4*  CL 98*  CO2 22  GLUCOSE 245*  BUN 40*  CREATININE 1.50*  CALCIUM 9.3   GFR: Estimated Creatinine Clearance: 49.4 mL/min (A) (by C-G formula based on SCr of 1.5 mg/dL (H)). Liver Function Tests:  Recent Labs Lab 12/23/16 0952  AST 59*  ALT 54  ALKPHOS 406*  BILITOT 1.6*  PROT 6.5  ALBUMIN 3.2*    Recent Labs Lab 12/23/16 0952  LIPASE 18    Recent Labs Lab 12/23/16 1339  AMMONIA 17   Urine analysis:    Component Value Date/Time   COLORURINE AMBER (A) 11/10/2016 1856   APPEARANCEUR CLOUDY (A) 11/10/2016 1856   LABSPEC 1.024 11/10/2016 1856   PHURINE 5.0 11/10/2016 1856   GLUCOSEU 50 (A) 11/10/2016 1856   HGBUR LARGE (A) 11/10/2016 1856   BILIRUBINUR SMALL (A) 11/10/2016 1856   KETONESUR NEGATIVE 11/10/2016 1856   PROTEINUR NEGATIVE 11/10/2016 1856   UROBILINOGEN 0.2 05/13/2015 1315   NITRITE NEGATIVE 11/10/2016 1856   LEUKOCYTESUR NEGATIVE 11/10/2016 1856    Radiological Exams on Admission: Ct Abdomen Pelvis W Contrast  Result Date: 12/23/2016 CLINICAL DATA:  Acute generalized abdominal pain. Hepatic cirrhosis. EXAM: CT ABDOMEN AND PELVIS WITH CONTRAST  TECHNIQUE: Multidetector CT imaging of the abdomen and pelvis was performed using the standard protocol following bolus administration of intravenous contrast. CONTRAST:  60mL ISOVUE-300 IOPAMIDOL (ISOVUE-300) INJECTION 61% COMPARISON:  CT scan of November 11, 2016. FINDINGS: Lower chest: No acute abnormality. Hepatobiliary: Hepatic cirrhosis is noted.  No gallstones are noted. Pancreas: Unremarkable. No pancreatic ductal dilatation or surrounding inflammatory changes. Spleen: Normal in size without focal abnormality. Adrenals/Urinary Tract: Adrenal glands are unremarkable. No hydronephrosis or renal obstruction is noted. Urinary bladder appears normal. Moderate right renal cortical scarring and atrophy is noted. Left kidney appears normal. Stomach/Bowel: Stomach is within normal limits. Appendix appears normal. No evidence of bowel wall thickening, distention, or inflammatory changes. Vascular/Lymphatic: Aortic atherosclerosis. No enlarged abdominal or pelvic lymph nodes. Reproductive: Prostate is unremarkable. Other: No hernia is noted.  Moderate ascites is noted. Musculoskeletal: No acute or significant osseous findings. IMPRESSION: Hepatic cirrhosis. Moderate ascites. Aortic atherosclerosis. No other definite abnormality seen in the abdomen or pelvis. Electronically Signed   By: Marijo Conception, M.D.   On: 12/23/2016 11:31   US Paracentesis  Result Date: 12/22/2016 INDICATION: Ascites.  Cirrhosis. EXAM: ULTRASOUND GUIDED RIGHT PARACENTESIS MEDICATIONS: None. COMPLICATIONS: None immediate. PROCEDURE: Informed written consent was obtained from the patient after a discussion of the risks, benefits and alternatives to treatment. A timeout was performed prior to the initiation of the procedure. Initial ultrasound scanning demonstrates a large amount of ascites within the right lower abdominal quadrant. The right lower abdomen was prepped and draped in the usual sterile fashion. 1% lidocaine was used for local  anesthesia. Following this, a Yuen catheter was introduced. An ultrasound image was saved for documentation purposes. The paracentesis was performed. The catheter was removed and a dressing was applied. The patient tolerated the procedure well without immediate post procedural complication. FINDINGS: A total of approximately 4.3 L of  bloody ascitic fluid was removed. IMPRESSION: Successful ultrasound-guided paracentesis yielding 4.3 liters of peritoneal fluid. Electronically Signed   By: Lorriane Shire M.D.   On: 12/22/2016 12:57    EKG: Independently reviewed.   Assessment/Plan Principal Problem:   Fever Active Problems:   CHF (congestive heart failure) (HCC)   COPD (chronic obstructive pulmonary disease) (H[facility])   Thyroid disease   Diabetes mellitus    PLAN:   Sepsis:  I supsect he has early sepsis.  BC were obtained.  He was given IV Levoquin and Flagyl.  I will change to IV Cipro and Lucianne Lei.  He was recently in the hospital for the TIPS procedure, but it had to be aborted due to his anatomy.  He also has been getting LVP.    Hypotension:  Responding to IVF.  Will give albumin.  Not just sepsis, but likely fluid shift as well from LVP.  Will monitor.  He is feeling better even in the ER.  Hypothyroidism:  Continue with supplement.  Check TSH.  DM:  Will continue with insulin.  Sensitive coverage.   Hx of CHF/ S/p ICD placement.  Noted.    DVT prophylaxis: sub Q heparin.  Code Status: FULL CODE.  Family Communication:  Disposition Plan: Home when appropriate.  Consults called: None.  Admission status: Inpatient.    Zayquan Bogard MD FACP. [facility]  If 7PM-7AM, please contact night-coverage [email or web address] Password [email or web address]  12/23/2016, 5:09 PM

## 2016-12-23 NOTE — Telephone Encounter (Signed)
Pt's wife, Jeannene Patella, called this morning to say that patient had fluid drawn off of him yesterday and he has been feeling dizzy and nausea with no appetite since having it done. I told her the nurse wasn't available at the moment. She said that she was going to take him to the ER.

## 2016-12-23 NOTE — ED Triage Notes (Signed)
PT had 4.3L removed via paracentesis yesterday from right side of abdomen and since procedure has c/o pain to abdomen with nausea. EMS reports hypotension of 80s/50s initially and treated with 200 NS bolus with resolution of hypotension. PT denies any pain and is alert and oriented and is on 4L continuous O2 via n/c at home.

## 2016-12-23 NOTE — ED Notes (Signed)
CRITICAL VALUE ALERT  Critical value received:  Lactic Acid 3.56  Date of notification:  12/23/16  Time of notification:  1658  Critical value read back:Yes.    Nurse who received alert:  Norm Salt, RN  MD notified (1st page):  Dr. Marin Comment  Time of first page:  51  MD notified (2nd page):  Time of second page:  Responding MD:  Dr. Marin Comment  Time MD responded:  1700

## 2016-12-23 NOTE — Telephone Encounter (Signed)
Noted, routing to EG 

## 2016-12-23 NOTE — ED Notes (Signed)
Attempted to call report, RN unavailable at this time.

## 2016-12-23 NOTE — Discharge Instructions (Signed)
Your testing today has shown that you have a slightly elevated potassium level for which we have given you a medication called Kayexalate. This will likely make you have a bowel movement today. We have also given you some fluids because you're slightly dehydrated. We have identified that you have a slightly elevated blood count and a slightly lowered blood pressure. For this reason I recommended admission to the hospital but you have declined. You may return at any point for further evaluation if you change your mind or if your symptoms worsen. Please drink plenty of fluids, take her lactulose as prescribed by your doctor in follow-up within 24 hours for a recheck at your doctor's office. This is mandatory. If you cannot be seen at your doctor's office tomorrow, return to the emergency department for an evaluation.

## 2016-12-24 ENCOUNTER — Inpatient Hospital Stay (HOSPITAL_COMMUNITY): Payer: Medicare Other

## 2016-12-24 DIAGNOSIS — N179 Acute kidney failure, unspecified: Secondary | ICD-10-CM

## 2016-12-24 DIAGNOSIS — J449 Chronic obstructive pulmonary disease, unspecified: Secondary | ICD-10-CM

## 2016-12-24 DIAGNOSIS — R509 Fever, unspecified: Secondary | ICD-10-CM

## 2016-12-24 DIAGNOSIS — I5032 Chronic diastolic (congestive) heart failure: Secondary | ICD-10-CM

## 2016-12-24 DIAGNOSIS — A419 Sepsis, unspecified organism: Principal | ICD-10-CM

## 2016-12-24 DIAGNOSIS — E440 Moderate protein-calorie malnutrition: Secondary | ICD-10-CM | POA: Insufficient documentation

## 2016-12-24 DIAGNOSIS — K7460 Unspecified cirrhosis of liver: Secondary | ICD-10-CM

## 2016-12-24 DIAGNOSIS — J9611 Chronic respiratory failure with hypoxia: Secondary | ICD-10-CM | POA: Diagnosis present

## 2016-12-24 LAB — CBC
HCT: 36.9 % — ABNORMAL LOW (ref 39.0–52.0)
HEMOGLOBIN: 11.7 g/dL — AB (ref 13.0–17.0)
MCH: 30.2 pg (ref 26.0–34.0)
MCHC: 31.7 g/dL (ref 30.0–36.0)
MCV: 95.3 fL (ref 78.0–100.0)
Platelets: 343 10*3/uL (ref 150–400)
RBC: 3.87 MIL/uL — AB (ref 4.22–5.81)
RDW: 15.7 % — ABNORMAL HIGH (ref 11.5–15.5)
WBC: 12.2 10*3/uL — ABNORMAL HIGH (ref 4.0–10.5)

## 2016-12-24 LAB — URINALYSIS, ROUTINE W REFLEX MICROSCOPIC
Bilirubin Urine: NEGATIVE
GLUCOSE, UA: NEGATIVE mg/dL
Hgb urine dipstick: NEGATIVE
KETONES UR: NEGATIVE mg/dL
Nitrite: NEGATIVE
Protein, ur: NEGATIVE mg/dL
Specific Gravity, Urine: 1.033 — ABNORMAL HIGH (ref 1.005–1.030)
pH: 5 (ref 5.0–8.0)

## 2016-12-24 LAB — COMPREHENSIVE METABOLIC PANEL
ALK PHOS: 325 U/L — AB (ref 38–126)
ALT: 41 U/L (ref 17–63)
ANION GAP: 9 (ref 5–15)
AST: 44 U/L — ABNORMAL HIGH (ref 15–41)
Albumin: 3 g/dL — ABNORMAL LOW (ref 3.5–5.0)
BILIRUBIN TOTAL: 1 mg/dL (ref 0.3–1.2)
BUN: 29 mg/dL — ABNORMAL HIGH (ref 6–20)
CALCIUM: 8.6 mg/dL — AB (ref 8.9–10.3)
CO2: 26 mmol/L (ref 22–32)
Chloride: 102 mmol/L (ref 101–111)
Creatinine, Ser: 1.13 mg/dL (ref 0.61–1.24)
GLUCOSE: 98 mg/dL (ref 65–99)
Potassium: 3.4 mmol/L — ABNORMAL LOW (ref 3.5–5.1)
Sodium: 137 mmol/L (ref 135–145)
Total Protein: 5.9 g/dL — ABNORMAL LOW (ref 6.5–8.1)

## 2016-12-24 LAB — GLUCOSE, CAPILLARY
GLUCOSE-CAPILLARY: 125 mg/dL — AB (ref 65–99)
Glucose-Capillary: 200 mg/dL — ABNORMAL HIGH (ref 65–99)
Glucose-Capillary: 136 mg/dL — ABNORMAL HIGH (ref 65–99)
Glucose-Capillary: 162 mg/dL — ABNORMAL HIGH (ref 65–99)

## 2016-12-24 MED ORDER — URSODIOL 300 MG PO CAPS
600.0000 mg | ORAL_CAPSULE | Freq: Two times a day (BID) | ORAL | Status: DC
Start: 2016-12-24 — End: 2016-12-26
  Administered 2016-12-24 – 2016-12-26 (×5): 600 mg via ORAL
  Filled 2016-12-24 (×7): qty 2

## 2016-12-24 MED ORDER — SODIUM CHLORIDE 0.9 % IV SOLN: INTRAVENOUS | Status: DC

## 2016-12-24 MED ORDER — ALBUMIN HUMAN 25 % IV SOLN
INTRAVENOUS | Status: AC
  Filled 2016-12-24: qty 50

## 2016-12-24 MED ORDER — ALBUMIN HUMAN 25 % IV SOLN
INTRAVENOUS | Status: AC
  Filled 2016-12-24: qty 100

## 2016-12-24 MED ORDER — ALBUMIN HUMAN 25 % IV SOLN
25.0000 g | Freq: Two times a day (BID) | INTRAVENOUS | Status: AC
  Administered 2016-12-25 (×2): 25 g via INTRAVENOUS
  Filled 2016-12-24 (×2): qty 100

## 2016-12-24 MED ORDER — POTASSIUM CHLORIDE IN NACL 20-0.9 MEQ/L-% IV SOLN
INTRAVENOUS | Status: DC
Start: 2016-12-24 — End: 2016-12-25
  Administered 2016-12-24 – 2016-12-25 (×2): via INTRAVENOUS

## 2016-12-24 NOTE — Progress Notes (Signed)
PROGRESS NOTE    [name]  FBP:[id number] DOB: 1961/07/23 DOA: 12/23/2016 PCP: Petra Kuba, MD    Brief Narrative:  56 year old with multiple medical problems including cirrhosis with ascites requiring weekly paracentesis, presents to the emergency room with complaints of abdominal pain and fever. He is currently mildly hypotensive and had an elevated lactic acid. Creatinine of 1.5. WBC count of 15,000. He was started on intravenous antibiotics and admitted for possible sepsis of unclear source.   Assessment & Plan:   Principal Problem:   Fever Active Problems:   Type 2 diabetes mellitus (HCC)   Cirrhosis of liver with ascites (HCC)   Chronic diastolic heart failure (HCC)   Hypothyroidism   AKI (acute kidney injury) ([place])   COPD (chronic obstructive pulmonary disease) (HCC)   Diabetes mellitus   Malnutrition of moderate degree   Chronic respiratory failure with hypoxia ([place])   1. Possible sepsis. Patient presented with fever and hypotension. He's been started on intravenous antibiotics. Sources unclear at this time. Blood cultures have been sent. Will check chest x-ray and urinalysis. Abdominal pain has improved . CT of the abdomen and pelvis was unrevealing for underlying infection. He has not had any fever since admission. Continue to monitor.  2. Chronic respiratory failure on 4 L of oxygen. Respiratory status currently appears to be at baseline.  3. COPD . no signs of exacerbation at this time. No wheezing. Continue his bronchodilators as needed.  4. Chronic diastolic congestive heart failure. Appears compensated at this time.  5. Acute kidney injury. Apparently related to volume depletion. Improved with some hydration. Continue to monitor.  6. Cirrhosis of liver. Patient gets weekly paracentesis. He does have some mild ascites at this time. He is scheduled for repeat paracentesis next week. He will receive albumin infusion since he is receiving IV fluids at  this time.  7. Diabetes. Continue on sliding scale insulin.  8. Hypothyroidism. Continue Synthroid.   DVT prophylaxis: Heparin Code Status: Full code Family Communication: Discussed with wife at the bedside Disposition Plan: Discharge home once improved   Consultants:     Procedures:     Antimicrobials:   Vancomycin 4/5>>  Ciprofloxacin 4/5>>   Subjective: Feeling better. Abdominal pain improving.  Objective: Vitals:   12/23/16 1947 12/23/16 2008 12/24/16 0541 12/24/16 1551  BP:  (!) 93/53 (!) 93/59 110/73  Pulse:  (!) 50 99 (!) 102  Resp:  18 18 19   Temp: 97.6 F (36.4 C) 97.8 F (36.6 C) 98 F (36.7 C) 97.6 F (36.4 C)  TempSrc: Oral Oral Oral Oral  SpO2:  93% 96% 98%  Weight:  63.8 kg (140 lb 10.5 oz)    Height:  5\' 3"  (1.6 m)      Intake/Output Summary (Last 24 hours) at 12/24/16 1920 Last data filed at 12/24/16 1825  Gross per 24 hour  Intake            912.5 ml  Output                0 ml  Net            912.5 ml   Filed Weights   12/23/16 0918 12/23/16 2008  Weight: 63.5 kg (140 lb) 63.8 kg (140 lb 10.5 oz)    Examination:  General exam: Appears calm and comfortable  Respiratory system: Clear to auscultation. Respiratory effort normal. Cardiovascular system: S1 & S2 heard, RRR. No JVD, murmurs, rubs, gallops or clicks. No pedal edema. Gastrointestinal system: Abdomen  is nondistended, soft and nontender. No organomegaly or masses felt. Normal bowel sounds heard. Central nervous system: Alert and oriented. No focal neurological deficits. Extremities: Symmetric 5 x 5 power. Skin: No rashes, lesions or ulcers Psychiatry: Judgement and insight appear normal. Mood & affect appropriate.     Data Reviewed: I have personally reviewed following labs and imaging studies  CBC:  Recent Labs Lab 12/23/16 0952 12/24/16 0601  WBC 15.7* 12.2*  NEUTROABS 12.7*  --   HGB 13.9 11.7*  HCT 42.8 36.9*  MCV 94.9 95.3  PLT 514* 657   Basic  Metabolic Panel:  Recent Labs Lab 12/23/16 0952 12/24/16 0601  NA 135 137  K 5.4* 3.4*  CL 98* 102  CO2 22 26  GLUCOSE 245* 98  BUN 40* 29*  CREATININE 1.50* 1.13  CALCIUM 9.3 8.6*   GFR: Estimated Creatinine Clearance: 58.7 mL/min (by C-G formula based on SCr of 1.13 mg/dL). Liver Function Tests:  Recent Labs Lab 12/23/16 0952 12/24/16 0601  AST 59* 44*  ALT 54 41  ALKPHOS 406* 325*  BILITOT 1.6* 1.0  PROT 6.5 5.9*  ALBUMIN 3.2* 3.0*    Recent Labs Lab 12/23/16 0952  LIPASE 18    Recent Labs Lab 12/23/16 1339  AMMONIA 17   Coagulation Profile: No results for input(s): INR, PROTIME in the last 168 hours. Cardiac Enzymes: No results for input(s): CKTOTAL, CKMB, CKMBINDEX, TROPONINI in the last 168 hours. BNP (last 3 results) No results for input(s): PROBNP in the last 8760 hours. HbA1C: No results for input(s): HGBA1C in the last 72 hours. CBG:  Recent Labs Lab 12/23/16 2053 12/24/16 0807 12/24/16 1139 12/24/16 1638  GLUCAP 170* 125* 200* 136*   Lipid Profile: No results for input(s): CHOL, HDL, LDLCALC, TRIG, CHOLHDL, LDLDIRECT in the last 72 hours. Thyroid Function Tests:  Recent Labs  12/23/16 1339  TSH 4.296   Anemia Panel: No results for input(s): VITAMINB12, FOLATE, FERRITIN, TIBC, IRON, RETICCTPCT in the last 72 hours. Sepsis Labs:  Recent Labs Lab 12/23/16 1639  LATICACIDVEN 3.56*    Recent Results (from the past 240 hour(s))  Blood Culture (routine x 2)     Status: None (Preliminary result)   Collection Time: 12/23/16  4:23 PM  Result Value Ref Range Status   Specimen Description LEFT ANTECUBITAL  Final   Special Requests   Final    BOTTLES DRAWN AEROBIC AND ANAEROBIC Blood Culture adequate volume   Culture NO GROWTH < 24 HOURS  Final   Report Status PENDING  Incomplete  Blood Culture (routine x 2)     Status: None (Preliminary result)   Collection Time: 12/24/16  6:01 AM  Result Value Ref Range Status   Specimen  Description BLOOD LEFT ARM  Final   Special Requests   Final    BOTTLES DRAWN AEROBIC AND ANAEROBIC Blood Culture adequate volume   Culture PENDING  Incomplete   Report Status PENDING  Incomplete         Radiology Studies: Ct Abdomen Pelvis W Contrast  Result Date: 12/23/2016 CLINICAL DATA:  Acute generalized abdominal pain. Hepatic cirrhosis. EXAM: CT ABDOMEN AND PELVIS WITH CONTRAST TECHNIQUE: Multidetector CT imaging of the abdomen and pelvis was performed using the standard protocol following bolus administration of intravenous contrast. CONTRAST:  67mL ISOVUE-300 IOPAMIDOL (ISOVUE-300) INJECTION 61% COMPARISON:  CT scan of November 11, 2016. FINDINGS: Lower chest: No acute abnormality. Hepatobiliary: Hepatic cirrhosis is noted.  No gallstones are noted. Pancreas: Unremarkable. No pancreatic ductal dilatation or surrounding  inflammatory changes. Spleen: Normal in size without focal abnormality. Adrenals/Urinary Tract: Adrenal glands are unremarkable. No hydronephrosis or renal obstruction is noted. Urinary bladder appears normal. Moderate right renal cortical scarring and atrophy is noted. Left kidney appears normal. Stomach/Bowel: Stomach is within normal limits. Appendix appears normal. No evidence of bowel wall thickening, distention, or inflammatory changes. Vascular/Lymphatic: Aortic atherosclerosis. No enlarged abdominal or pelvic lymph nodes. Reproductive: Prostate is unremarkable. Other: No hernia is noted.  Moderate ascites is noted. Musculoskeletal: No acute or significant osseous findings. IMPRESSION: Hepatic cirrhosis. Moderate ascites. Aortic atherosclerosis. No other definite abnormality seen in the abdomen or pelvis. Electronically Signed   By: Marijo Conception, M.D.   On: 12/23/2016 11:31        Scheduled Meds: . albumin human  25 g Intravenous BID  . aspirin  325 mg Oral Daily  . ciprofloxacin  400 mg Intravenous Q12H  . feeding supplement (ENSURE ENLIVE)  237 mL Oral BID  BM  . heparin  5,000 Units Subcutaneous Q8H  . insulin aspart  0-5 Units Subcutaneous QHS  . insulin aspart  0-9 Units Subcutaneous TID WC  . insulin aspart protamine- aspart  8 Units Subcutaneous BID WC  . lactulose  10 g Oral BID  . levothyroxine  50 mcg Oral QAC breakfast  . modafinil  200 mg Oral Daily  . pantoprazole  40 mg Oral Daily  . ursodiol  600 mg Oral BID  . vancomycin  500 mg Intravenous Q12H   Continuous Infusions: . 0.9 % NaCl with KCl 20 mEq / L 75 mL/hr at 12/24/16 1815     LOS: 1 day    Time spent: 73mins    Kailan Carmen, MD [facility] Pager [phone]  If 7PM-7AM, please contact night-coverage [email or web address] Password [email or web address] 12/24/2016, 7:20 PM

## 2016-12-24 NOTE — Care Management Important Message (Signed)
Important Message  Patient Details  Name: [name] MRN: [id number] Date of Birth: [birth year]   Medicare Important Message Given:  Yes    Sherald Barge, RN 12/24/2016, 2:41 PM

## 2016-12-24 NOTE — Care Management Note (Signed)
Case Management Note  Patient Details  Name: [name] MRN: [id number] Date of Birth: 01/24/1961  Subjective/Objective:                  Pt from home, lives with his wife who is his primary caregiver. Pt mostly ind with ADL's but receives care from wife when needed. Pt previously DC from [facility] and family interested in [facility] services again. [facility] unable to provide nursing due to pt's payer. Other HH options dicussed. Pt/wife have no preference. [facility] able to provide services. Family made aware. Family aware HH has 48hrs to make firs visit.   Action/Plan: Plan for return home with [facility] services. [place] will be notified when pt discharges and faxed pt info.   Expected Discharge Date:     12/26/2016             Expected Discharge Plan:  [place]  In-House Referral:  NA  Discharge planning Services     Post Acute Care Choice:  Home Health Choice offered to:  Spouse  HH Arranged:  RN, PT, Nurse's Aide [place] Agency:     Status of Service:  Completed, signed off  Sherald Barge, RN 12/24/2016, 2:38 PM

## 2016-12-24 NOTE — Progress Notes (Signed)
Initial Nutrition Assessment  DOCUMENTATION CODES:  Non-severe (moderate) malnutrition in context of chronic illness  INTERVENTION:  Diet education on low sodium, restricted fluid + Handout "Low sodium Nutrition Therapy"  Diet education on Cirrhotic diet-3 meals, 3 snacks, 1 supplement.+ Handout "Cirrhosis Nutrition Therapy"  Boost nutritional supplement while admitted-TID  Coupons for Ensure.   NUTRITION DIAGNOSIS:  Malnutrition (Moderate) related to catabolic illness (End stage liver disease) + much increased energy/pro requirements as evidenced by loss of 25% bw in last year and moderate muscle/fat wasting  GOAL:  Patient will meet greater than or equal to 90% of their needs  MONITOR:  PO intake, Supplement acceptance, Labs, Weight trends, I & O's  REASON FOR ASSESSMENT:  Malnutrition Screening Tool    ASSESSMENT:  56 y/o male PMHx etoh abuse, aortic stenosis, Afib, pacemaker, cirrhosis w/ ascites, GERD, DM2, Congenital deafness, HTN, HLD, Sleep Apnea, HF,Failed TIPS, recent LVP (has 1x q ~2 weeks). Presented to ED with abdominal discomfort. Worked up for early sepsis.   Pt seen with wife. Wife is primary caregiver and gave history given pt's difficulty communicating (deafness).   RD opened by asking about the low sodium/fluid diet recommendations he sees were placed by Dr. Gala Romney. Asked for dietary recall to assess fluid, sodium and overall caloric/protein intake.   Breakfast: 1 slice reg bacon, 1 scrambled egg w/ cooking spray, 1 bowl oatmeal, 1 glass of juice for his medicine, coffee Lunch: Sandwich-examples given are roast beef and [place] Dinner: Typical home cooked meal. Says it looks like his lunch does today ie source of protein, starch and veggie.  Other: Patient has been told 7/5 cups of fluid per day (60 oz) drinks 1 Boost per day. When asked about doing more, Wife says these are fairly cost prohibitive. Pt does not eat snacks. He used to use saltshaker, but not as  much recently  Patient's diet is thought to be overall fairly good. He is eating 3 meals and a supplement per day. No very high sodium foods identified, though he unknown exactly how much he uses salt shaker.   RD educated that cirrhotics enter starvation state very quickly and will utilize muscle/fat store much sooner. Pt needs to minimize period between meals. nutrition recommendations for cirrhotics and minimi is 3 meals, 3 snacks, and 1 bedtime supplement.   Evidenced this high pro/kcal need with patients weight hx. Even though patient has ascites with frequent LVPs, has still lost an extraordinary amount of weight that is not all attributable to ascites. Has lost >50% in <1 year, equating for  >25% of bw. Has lost 20-40 lbs in the last 3 months alone. Wife also states that she used not to be able to see his pacemaker wires, but these are now very visible.   Wife asks if she has to provide him with more supplements as these are cost prohibitive. 1/day would be ok if pt eats snacks. Coupons for ENsure are given.   She requests new handouts for low sodium diet as the ones she has received previously confused her.   Physical exam: Muscle/fat wasting most evident in temporalis and orbitals. Mod wasting elsewhere. Currently has only small amount ascites.   Labs:  Wbc: 12.2, Albumin 3.0 (receivied infusion), 3/23: a1c 2 weeks ago was 7.0 Medications: IV abx, albumin, Ensure Enlive, Insulin, Lactulose, ppi,   Recent Labs Lab 12/23/16 0952 12/24/16 0601  NA 135 137  K 5.4* 3.4*  CL 98* 102  CO2 22 26  BUN 40* 29*  CREATININE 1.50* 1.13  CALCIUM 9.3 8.6*  GLUCOSE 245* 98   Diet Order:  Diet Carb Modified Fluid consistency: Thin; Room service appropriate? Yes  Skin: Closed incisions to Abdomen and Chest  Last BM:  4/5  Height:  Ht Readings from Last 1 Encounters:  12/23/16 5\' 3"  (1.6 m)   Weight:  Wt Readings from Last 1 Encounters:  12/23/16 140 lb 10.5 oz (63.8 kg)   Wt  Readings from Last 10 Encounters:  12/23/16 140 lb 10.5 oz (63.8 kg)  12/15/16 141 lb (64 kg)  12/10/16 141 lb 12.8 oz (64.3 kg)  11/30/16 162 lb (73.5 kg)  11/25/16 163 lb (73.9 kg)  11/22/16 182 lb 3.2 oz (82.6 kg)  11/13/16 168 lb 10.4 oz (76.5 kg)  09/30/16 176 lb (79.8 kg)  09/21/16 176 lb 12.8 oz (80.2 kg)  07/27/16 183 lb (83 kg)   Ideal Body Weight:  56.36 kg  BMI:  Body mass index is 24.92 kg/m.  Estimated Nutritional Needs:  Kcal:  1900-2100 (30-33 kcal/kg bw) Protein:  95-110 g Pro (1.5-1.7 g/kg bw) Fluid:  <1.8 Liters (MD note 2/5)  EDUCATION NEEDS:  Education needs addressed  Burtis Junes RD, LDN, CNSC Clinical Nutrition Pager: [phone] 12/24/2016 1:19 PM

## 2016-12-25 LAB — BASIC METABOLIC PANEL
Anion gap: 10 (ref 5–15)
BUN: 25 mg/dL — ABNORMAL HIGH (ref 6–20)
CO2: 23 mmol/L (ref 22–32)
Calcium: 8.3 mg/dL — ABNORMAL LOW (ref 8.9–10.3)
Chloride: 103 mmol/L (ref 101–111)
Creatinine, Ser: 0.97 mg/dL (ref 0.61–1.24)
GFR calc Af Amer: >60 mL/min (ref >60)
GLUCOSE: 83 mg/dL (ref 65–99)
POTASSIUM: 3.4 mmol/L — AB (ref 3.5–5.1)
Sodium: 136 mmol/L (ref 135–145)

## 2016-12-25 LAB — CBC
HEMATOCRIT: 31.9 % — AB (ref 39.0–52.0)
Hemoglobin: 10.4 g/dL — ABNORMAL LOW (ref 13.0–17.0)
MCH: 30.5 pg (ref 26.0–34.0)
MCHC: 32.6 g/dL (ref 30.0–36.0)
MCV: 93.5 fL (ref 78.0–100.0)
PLATELETS: 299 10*3/uL (ref 150–400)
RBC: 3.41 MIL/uL — AB (ref 4.22–5.81)
RDW: 15.6 % — ABNORMAL HIGH (ref 11.5–15.5)
WBC: 10.1 10*3/uL (ref 4.0–10.5)

## 2016-12-25 LAB — GLUCOSE, CAPILLARY
Glucose-Capillary: 107 mg/dL — ABNORMAL HIGH (ref 65–99)
Glucose-Capillary: 171 mg/dL — ABNORMAL HIGH (ref 65–99)
Glucose-Capillary: 159 mg/dL — ABNORMAL HIGH (ref 65–99)
Glucose-Capillary: 152 mg/dL — ABNORMAL HIGH (ref 65–99)

## 2016-12-25 LAB — HIV ANTIBODY (ROUTINE TESTING W REFLEX): HIV SCREEN 4TH GENERATION: NONREACTIVE

## 2016-12-25 MED ORDER — VANCOMYCIN HCL IN DEXTROSE 1-5 GM/200ML-% IV SOLN
1000.0000 mg | Freq: Once | INTRAVENOUS | Status: AC
  Administered 2016-12-25: 1000 mg via INTRAVENOUS
  Filled 2016-12-25: qty 200

## 2016-12-25 MED ORDER — POTASSIUM CHLORIDE CRYS ER 20 MEQ PO TBCR
40.0000 meq | EXTENDED_RELEASE_TABLET | ORAL | Status: AC
Start: 2016-12-25 — End: 2016-12-25
  Administered 2016-12-25 (×2): 40 meq via ORAL
  Filled 2016-12-25 (×2): qty 2

## 2016-12-25 MED ORDER — VANCOMYCIN HCL 500 MG IV SOLR
500.0000 mg | Freq: Two times a day (BID) | INTRAVENOUS | Status: DC
  Administered 2016-12-26: 500 mg via INTRAVENOUS
  Filled 2016-12-25 (×3): qty 500

## 2016-12-25 NOTE — Progress Notes (Signed)
PROGRESS NOTE    [name]  YBO:[id number] DOB: Sep 17, 1961 DOA: 12/23/2016 PCP: Petra Kuba, MD    Brief Narrative:  56 year old with multiple medical problems including cirrhosis with ascites requiring weekly paracentesis, presents to the emergency room with complaints of abdominal pain and fever. He is currently mildly hypotensive and had an elevated lactic acid. Creatinine of 1.5. WBC count of 15,000. He was started on intravenous antibiotics and admitted for possible sepsis of unclear source.   Assessment & Plan:   Principal Problem:   Fever Active Problems:   Type 2 diabetes mellitus (HCC)   Cirrhosis of liver with ascites (HCC)   Chronic diastolic heart failure (HCC)   Hypothyroidism   AKI (acute kidney injury) ([place])   COPD (chronic obstructive pulmonary disease) (HCC)   Diabetes mellitus   Malnutrition of moderate degree   Chronic respiratory failure with hypoxia ([place])   1. Possible sepsis. Patient presented with fever and hypotension. He's been started on intravenous antibiotics. Sources unclear at this time. Blood cultures have been, but have not resulted yet. Chest xray and urinalysis unremarkable. Abdominal pain has improved . CT of the abdomen and pelvis was unrevealing for underlying infection. He has not had any fever since admission. Continue to monitor. If blood cultures are negative, anticipate discharge home in AM  2. Chronic respiratory failure on 4 L of oxygen. Respiratory status currently appears to be at baseline.  3. COPD . no signs of exacerbation at this time. No wheezing. Continue his bronchodilators as needed.  4. Chronic diastolic congestive heart failure. Appears compensated at this time.  5. Acute kidney injury. Apparently related to volume depletion. Improved with some hydration. Continue to monitor.  6. Cirrhosis of liver. Patient gets weekly paracentesis. He does have some mild ascites at this time. He is scheduled for repeat  paracentesis next week.   7. Diabetes. Continue on sliding scale insulin.  8. Hypothyroidism. Continue Synthroid.   DVT prophylaxis: Heparin Code Status: Full code Family Communication: Discussed with wife at the bedside Disposition Plan: Discharge home once improved   Consultants:     Procedures:     Antimicrobials:   Vancomycin 4/5>>  Ciprofloxacin 4/5>>   Subjective: Feeling better. Abdominal pain improving. No vomiting. No cough  Objective: Vitals:   12/25/16 0018 12/25/16 0135 12/25/16 0423 12/25/16 1625  BP: 109/66 106/68 109/77 116/73  Pulse: (!) 52 (!) 59 65 99  Resp: 18 16 16 16   Temp: 98 F (36.7 C) 98.2 F (36.8 C) 98 F (36.7 C) 98.4 F (36.9 C)  TempSrc: Oral Oral Oral Oral  SpO2: 95% 94% 98% 94%  Weight:      Height:        Intake/Output Summary (Last 24 hours) at 12/25/16 1813 Last data filed at 12/25/16 1700  Gross per 24 hour  Intake           1757.5 ml  Output                0 ml  Net           1757.5 ml   Filed Weights   12/23/16 0918 12/23/16 2008  Weight: 63.5 kg (140 lb) 63.8 kg (140 lb 10.5 oz)    Examination:  General exam: Appears calm and comfortable  Respiratory system: Clear to auscultation. Respiratory effort normal. Cardiovascular system: S1 & S2 heard, RRR. No JVD, murmurs, rubs, gallops or clicks. No pedal edema. Gastrointestinal system: Abdomen is nondistended, soft and nontender. No organomegaly or  masses felt. Normal bowel sounds heard. Central nervous system: Alert and oriented. No focal neurological deficits. Extremities: Symmetric 5 x 5 power. Skin: No rashes, lesions or ulcers Psychiatry: Judgement and insight appear normal. Mood & affect appropriate.     Data Reviewed: I have personally reviewed following labs and imaging studies  CBC:  Recent Labs Lab 12/23/16 0952 12/24/16 0601 12/25/16 0557  WBC 15.7* 12.2* 10.1  NEUTROABS 12.7*  --   --   HGB 13.9 11.7* 10.4*  HCT 42.8 36.9* 31.9*  MCV  94.9 95.3 93.5  PLT 514* 343 425   Basic Metabolic Panel:  Recent Labs Lab 12/23/16 0952 12/24/16 0601 12/25/16 0557  NA 135 137 136  K 5.4* 3.4* 3.4*  CL 98* 102 103  CO2 22 26 23   GLUCOSE 245* 98 83  BUN 40* 29* 25*  CREATININE 1.50* 1.13 0.97  CALCIUM 9.3 8.6* 8.3*   GFR: Estimated Creatinine Clearance: 68.4 mL/min (by C-G formula based on SCr of 0.97 mg/dL). Liver Function Tests:  Recent Labs Lab 12/23/16 0952 12/24/16 0601  AST 59* 44*  ALT 54 41  ALKPHOS 406* 325*  BILITOT 1.6* 1.0  PROT 6.5 5.9*  ALBUMIN 3.2* 3.0*    Recent Labs Lab 12/23/16 0952  LIPASE 18    Recent Labs Lab 12/23/16 1339  AMMONIA 17   Coagulation Profile: No results for input(s): INR, PROTIME in the last 168 hours. Cardiac Enzymes: No results for input(s): CKTOTAL, CKMB, CKMBINDEX, TROPONINI in the last 168 hours. BNP (last 3 results) No results for input(s): PROBNP in the last 8760 hours. HbA1C: No results for input(s): HGBA1C in the last 72 hours. CBG:  Recent Labs Lab 12/24/16 1638 12/24/16 2201 12/25/16 0803 12/25/16 1113 12/25/16 1620  GLUCAP 136* 162* 107* 171* 159*   Lipid Profile: No results for input(s): CHOL, HDL, LDLCALC, TRIG, CHOLHDL, LDLDIRECT in the last 72 hours. Thyroid Function Tests:  Recent Labs  12/23/16 1339  TSH 4.296   Anemia Panel: No results for input(s): VITAMINB12, FOLATE, FERRITIN, TIBC, IRON, RETICCTPCT in the last 72 hours. Sepsis Labs:  Recent Labs Lab 12/23/16 1639  LATICACIDVEN 3.56*    Recent Results (from the past 240 hour(s))  Blood Culture (routine x 2)     Status: None (Preliminary result)   Collection Time: 12/23/16  4:23 PM  Result Value Ref Range Status   Specimen Description LEFT ANTECUBITAL  Final   Special Requests   Final    BOTTLES DRAWN AEROBIC AND ANAEROBIC Blood Culture adequate volume   Culture NO GROWTH 2 DAYS  Final   Report Status PENDING  Incomplete  Blood Culture (routine x 2)     Status: None  (Preliminary result)   Collection Time: 12/24/16  6:01 AM  Result Value Ref Range Status   Specimen Description BLOOD LEFT ARM  Final   Special Requests   Final    BOTTLES DRAWN AEROBIC AND ANAEROBIC Blood Culture adequate volume   Culture NO GROWTH 1 DAY  Final   Report Status PENDING  Incomplete         Radiology Studies: Dg Chest Port 1 View  Result Date: 12/24/2016 CLINICAL DATA:  Fever a.  Sepsis. EXAM: PORTABLE CHEST 1 VIEW COMPARISON:  11/13/2016 FINDINGS: The heart is mildly enlarged but stable. Mild tortuosity of the thoracic aorta. The pacer wires are stable. No acute pulmonary findings. The bony thorax is intact. IMPRESSION: Mild stable cardiac enlargement.  No acute pulmonary findings. Electronically Signed   By: Mamie [name].  Gallerani M.D.   On: 12/24/2016 19:25        Scheduled Meds: . aspirin  325 mg Oral Daily  . ciprofloxacin  400 mg Intravenous Q12H  . feeding supplement (ENSURE ENLIVE)  237 mL Oral BID BM  . heparin  5,000 Units Subcutaneous Q8H  . insulin aspart  0-5 Units Subcutaneous QHS  . insulin aspart  0-9 Units Subcutaneous TID WC  . insulin aspart protamine- aspart  8 Units Subcutaneous BID WC  . lactulose  10 g Oral BID  . levothyroxine  50 mcg Oral QAC breakfast  . modafinil  200 mg Oral Daily  . pantoprazole  40 mg Oral Daily  . potassium chloride  40 mEq Oral Q3H  . ursodiol  600 mg Oral BID  . vancomycin  500 mg Intravenous Q12H   Continuous Infusions:    LOS: 2 days    Time spent: 63mins    Kathie Dike, MD [facility] Pager [phone]  If 7PM-7AM, please contact night-coverage [email or web address] Password [facility] 12/25/2016, 6:13 PM

## 2016-12-26 LAB — BASIC METABOLIC PANEL
Anion gap: 7 (ref 5–15)
BUN: 21 mg/dL — AB (ref 6–20)
CHLORIDE: 104 mmol/L (ref 101–111)
CO2: 23 mmol/L (ref 22–32)
Calcium: 8.4 mg/dL — ABNORMAL LOW (ref 8.9–10.3)
Creatinine, Ser: 0.91 mg/dL (ref 0.61–1.24)
GFR calc Af Amer: >60 mL/min (ref >60)
GFR calc non Af Amer: >60 mL/min (ref >60)
Glucose, Bld: 117 mg/dL — ABNORMAL HIGH (ref 65–99)
POTASSIUM: 4.7 mmol/L (ref 3.5–5.1)
SODIUM: 134 mmol/L — AB (ref 135–145)

## 2016-12-26 LAB — GLUCOSE, CAPILLARY
GLUCOSE-CAPILLARY: 107 mg/dL — AB (ref 65–99)
Glucose-Capillary: 119 mg/dL — ABNORMAL HIGH (ref 65–99)

## 2016-12-26 MED ORDER — LEVOFLOXACIN 750 MG PO TABS: 750.0000 mg | ORAL_TABLET | Freq: Every day | ORAL | 0 refills | Status: DC

## 2016-12-26 MED ORDER — VANCOMYCIN HCL 500 MG IV SOLR
INTRAVENOUS | Status: AC
  Filled 2016-12-26: qty 500

## 2016-12-26 NOTE — Discharge Summary (Signed)
Physician Discharge Summary  [name] ZOX:[id number] DOB: 1961-07-23 DOA: 12/23/2016  PCP: Petra Kuba, MD  Admit date: 12/23/2016 Discharge date: 12/26/2016  Admitted From: home Disposition: home  Recommendations for Outpatient Follow-up:  1. Follow up with PCP in 1-2 weeks 2. Please obtain BMP/CBC in one week 3. Patient will be referred to outpatient paracentesis tomorrow  Discharge Condition: stable CODE STATUS: full code Diet recommendation: Heart Healthy / Carb Modified   Brief/Interim Summary: 56 year old with multiple medical problems including cirrhosis with ascites requiring weekly paracentesis, presents to the emergency room with complaints of abdominal pain and fever. He is currently mildly hypotensive and had an elevated lactic acid. Creatinine of 1.5. WBC count of 15,000. He was started on intravenous antibiotics and admitted for possible sepsis of unclear source.  Discharge Diagnoses:  Principal Problem:   Fever Active Problems:   Type 2 diabetes mellitus (HCC)   Cirrhosis of liver with ascites (HCC)   Chronic diastolic heart failure (HCC)   Hypothyroidism   AKI (acute kidney injury) ([place])   COPD (chronic obstructive pulmonary disease) (HCC)   Diabetes mellitus   Malnutrition of moderate degree   Chronic respiratory failure with hypoxia ([place])  1. Possible sepsis. Patient presented with fever and hypotension. He was treated with intravenous antibiotics. Sources unclear at this time. Blood cultures showed no growth. Chest xray and urinalysis unremarkable. Abdominal pain has improved . CT of the abdomen and pelvis was unrevealing for underlying infection. He has not had any fever since admission. He will be transitioned to a course of levaquin to complete at home. Patient is otherwise feeling well.  2. Chronic respiratory failure on 4 L of oxygen. Respiratory status currently appears to be at baseline.  3. COPD . no signs of exacerbation at this time. No  wheezing. Continue his bronchodilators as needed.  4. Chronic diastolic congestive heart failure. Appears compensated at this time.  5. Acute kidney injury. Related to volume depletion. Improved with some hydration. Continue to monitor.  6. Cirrhosis of liver. Patient gets weekly paracentesis. He does have ascites at this time. He is scheduled for repeat paracentesis next week. Will try and have this done tomorrow as an outpatient  7. Diabetes. Resume home regimen.  8. Hypothyroidism. Continue Synthroid.  Discharge Instructions  Discharge Instructions    Diet - low sodium heart healthy    Complete by:  As directed    Increase activity slowly    Complete by:  As directed      Allergies as of 12/26/2016      Reactions   Enalapril Cough   Metformin And Related Diarrhea   Eliquis [apixaban] Other (See Comments)   DOSE RELATED BLEEDING   Penicillins Other (See Comments)   Reaction unspecified/childhood allergy Has patient had a PCN reaction causing immediate rash, facial/tongue/throat swelling, SOB or lightheadedness with hypotension: Yes Has patient had a PCN reaction causing severe rash involving mucus membranes or skin necrosis: No Has patient had a PCN reaction that required hospitalization No Has patient had a PCN reaction occurring within the last 10 years: No If all of the above answers are "NO", then may proceed with Cephalosporin use.      Medication List    STOP taking these medications   metoprolol succinate 25 MG 24 hr tablet Commonly known as:  TOPROL-XL     TAKE these medications   acetaminophen 325 MG tablet Commonly known as:  TYLENOL Take 650 mg by mouth every 6 (six) hours as needed for  mild pain.   albuterol (2.5 MG/3ML) 0.083% nebulizer solution Commonly known as:  PROVENTIL Take 2.5 mg by nebulization every 6 (six) hours as needed for wheezing or shortness of breath.   albuterol 108 (90 Base) MCG/ACT inhaler Commonly known as:  PROVENTIL  HFA;VENTOLIN HFA Inhale 2 puffs into the lungs every 6 (six) hours as needed for wheezing or shortness of breath.   aspirin 325 MG EC tablet Take 325 mg by mouth daily.   CENTRUM tablet Take 1 tablet by mouth daily.   cetirizine 10 MG tablet Commonly known as:  ZYRTEC Take 10 mg by mouth daily.   Fish Oil 1000 MG Caps Take 1 capsule by mouth every other day.   furosemide 20 MG tablet Commonly known as:  LASIX Take 1 tablet (20 mg total) by mouth daily. Take along with spironolactone   gentamicin ointment 0.1 % Commonly known as:  GARAMYCIN Apply 1 application topically at bedtime. Inside nose   GLUCOSAMINE CHONDR 1500 COMPLX PO Take 1 tablet by mouth every morning.   HUMALOG MIX 75/25 KWIKPEN (75-25) 100 UNIT/ML Kwikpen Generic drug:  Insulin Lispro Prot & Lispro Inject 8 Units into the skin 2 (two) times daily. 15 units in the morning and 15 units in the evening What changed:  additional instructions   lactulose 10 GM/15ML solution Commonly known as:  CHRONULAC 10cc (2 teaspoons) bid; Titrate to 2-3 semi formed stools a day What changed:  how much to take  how to take this  when to take this  additional instructions   levofloxacin 750 MG tablet Commonly known as:  LEVAQUIN Take 1 tablet (750 mg total) by mouth daily.   LEVOXYL 50 MCG tablet Generic drug:  levothyroxine Take 50 mcg by mouth daily before breakfast.   modafinil 200 MG tablet Commonly known as:  PROVIGIL Take 200 mg by mouth daily.   mometasone 50 MCG/ACT nasal spray Commonly known as:  NASONEX Place 2 sprays into the nose daily as needed (FOR CONGESTION/ALLERGIES).   nitroGLYCERIN 0.4 MG SL tablet Commonly known as:  NITROSTAT Place 1 tablet (0.4 mg total) under the tongue every 5 (five) minutes as needed for chest pain.   NON FORMULARY Bipap 16.0/e   omeprazole 40 MG capsule Commonly known as:  PRILOSEC TAKE 1 CAPSULE BY MOUTH ONCE DAILY FOR REFLUX.   spironolactone 50 MG  tablet Commonly known as:  ALDACTONE TAKE 1 TABLET BY MOUTH ONCE DAILY.   ursodiol 500 MG tablet Commonly known as:  ACTIGALL TAKE 1 TABLET BY MOUTH TWICE DAILY       Allergies  Allergen Reactions  . Enalapril Cough  . Metformin And Related Diarrhea  . Eliquis [Apixaban] Other (See Comments)    DOSE RELATED BLEEDING  . Penicillins Other (See Comments)    Reaction unspecified/childhood allergy Has patient had a PCN reaction causing immediate rash, facial/tongue/throat swelling, SOB or lightheadedness with hypotension: Yes Has patient had a PCN reaction causing severe rash involving mucus membranes or skin necrosis: No Has patient had a PCN reaction that required hospitalization No Has patient had a PCN reaction occurring within the last 10 years: No If all of the above answers are "NO", then may proceed with Cephalosporin use.    Consultations:     Procedures/Studies: Ct Abdomen Pelvis W Contrast  Result Date: 12/23/2016 CLINICAL DATA:  Acute generalized abdominal pain. Hepatic cirrhosis. EXAM: CT ABDOMEN AND PELVIS WITH CONTRAST TECHNIQUE: Multidetector CT imaging of the abdomen and pelvis was performed using the standard protocol  following bolus administration of intravenous contrast. CONTRAST:  59mL ISOVUE-300 IOPAMIDOL (ISOVUE-300) INJECTION 61% COMPARISON:  CT scan of November 11, 2016. FINDINGS: Lower chest: No acute abnormality. Hepatobiliary: Hepatic cirrhosis is noted.  No gallstones are noted. Pancreas: Unremarkable. No pancreatic ductal dilatation or surrounding inflammatory changes. Spleen: Normal in size without focal abnormality. Adrenals/Urinary Tract: Adrenal glands are unremarkable. No hydronephrosis or renal obstruction is noted. Urinary bladder appears normal. Moderate right renal cortical scarring and atrophy is noted. Left kidney appears normal. Stomach/Bowel: Stomach is within normal limits. Appendix appears normal. No evidence of bowel wall thickening,  distention, or inflammatory changes. Vascular/Lymphatic: Aortic atherosclerosis. No enlarged abdominal or pelvic lymph nodes. Reproductive: Prostate is unremarkable. Other: No hernia is noted.  Moderate ascites is noted. Musculoskeletal: No acute or significant osseous findings. IMPRESSION: Hepatic cirrhosis. Moderate ascites. Aortic atherosclerosis. No other definite abnormality seen in the abdomen or pelvis. Electronically Signed   By: Marijo Conception, M.D.   On: 12/23/2016 11:31   US Paracentesis  Result Date: 12/22/2016 INDICATION: Ascites.  Cirrhosis. EXAM: ULTRASOUND GUIDED RIGHT PARACENTESIS MEDICATIONS: None. COMPLICATIONS: None immediate. PROCEDURE: Informed written consent was obtained from the patient after a discussion of the risks, benefits and alternatives to treatment. A timeout was performed prior to the initiation of the procedure. Initial ultrasound scanning demonstrates a large amount of ascites within the right lower abdominal quadrant. The right lower abdomen was prepped and draped in the usual sterile fashion. 1% lidocaine was used for local anesthesia. Following this, a Yuen catheter was introduced. An ultrasound image was saved for documentation purposes. The paracentesis was performed. The catheter was removed and a dressing was applied. The patient tolerated the procedure well without immediate post procedural complication. FINDINGS: A total of approximately 4.3 L of bloody ascitic fluid was removed. IMPRESSION: Successful ultrasound-guided paracentesis yielding 4.3 liters of peritoneal fluid. Electronically Signed   By: Lorriane Shire M.D.   On: 12/22/2016 12:57   US Paracentesis  Result Date: 12/09/2016 INDICATION: Cirrhosis, ascites EXAM: ULTRASOUND GUIDED THERAPEUTIC PARACENTESIS MEDICATIONS: None. COMPLICATIONS: None immediate. PROCEDURE: Procedure, benefits, and risks of procedure were discussed with patient. Written informed consent for procedure was obtained. Time out  protocol followed. Adequate collection of ascites localized by ultrasound in RIGHT lower quadrant. Skin prepped and draped in usual sterile fashion. Skin and soft tissues anesthetized with 10 mL of 1% lidocaine. 5 [place] Yueh catheter placed into peritoneal cavity. 7.1 L of yellow ascitic fluid aspirated by vacuum bottle suction. Procedure tolerated well by patient without immediate complication. FINDINGS: As above IMPRESSION: Successful ultrasound-guided paracentesis yielding 7.1 liters of peritoneal fluid. Electronically Signed   By: Lavonia Dana M.D.   On: 12/09/2016 11:38   US Paracentesis  Result Date: 12/03/2016 INDICATION: Cirrhosis, ascites EXAM: ULTRASOUND GUIDED THERAPEUTIC PARACENTESIS MEDICATIONS: None. COMPLICATIONS: None immediate. PROCEDURE: Procedure, benefits, and risks of procedure were discussed with patient. Written informed consent for procedure was obtained from the patient's wife, his power of attorney. Time out protocol followed. Adequate collection of ascites localized by ultrasound in RIGHT lower quadrant. Skin prepped and draped in usual sterile fashion. Skin and soft tissues anesthetized with 10 mL of 1% lidocaine. 5 [place] Yueh catheter placed into peritoneal cavity. 9.3 L of brownish red fluid aspirated by vacuum bottle suction. Procedure tolerated well by patient without immediate complication. FINDINGS: As above IMPRESSION: Successful ultrasound-guided paracentesis yielding 9.3 liters of peritoneal fluid. Electronically Signed   By: Lavonia Dana M.D.   On: 12/03/2016 16:23   Ir Tips  Result Date: 12/14/2016 CLINICAL DATA:  History of alcoholic cirrhosis with recent recurrent symptomatic ascites. Request made for creation of a TIPS. Please refer to formal consultation in the epic EMR. EXAM: 1. ATTEMPTED TRANSJUGULAR INTRAHEPATIC PORTOSYSTEMIC SHUNT 2. IR PARACENTESIS COMPARISON:  CT abdomen and pelvis - 11/11/2016 MEDICATIONS: As antibiotic prophylaxis, Vancomycin 1 gm IV was  ordered pre-procedure and administered intravenously within one hour of incision. ANESTHESIA/SEDATION: General - as administered by the Anesthesia department CONTRAST:  125 cc Isovue-300 FLUOROSCOPY TIME:  Fluoroscopy Time: 55 minutes 18 seconds (667 mGy). COMPLICATIONS: None immediate. PROCEDURE: Informed written consent was obtained from the patient and the patient's wife after a thorough discussion of the procedural risks, benefits and alternatives. All questions were addressed. Maximal Sterile Barrier Technique was utilized including caps, mask, sterile gowns, sterile gloves, sterile drape, hand hygiene and skin antiseptic. A timeout was performed prior to the initiation of the procedure. Initial ultrasound scanning demonstrates a moderate amount of ascites within the right lower abdominal quadrant. The right lower abdomen was prepped and draped in the usual sterile fashion. 1% lidocaine with epinephrine was used for local anesthesia. An ultrasound image was saved for documentation purposed. An 8 Fr Safe-T-Centesis catheter was introduced. The paracentesis was performed as we proceeded with the TIPS creation. The skin overlying the right upper abdominal quadrant as well as the right neck were prepped and draped in usual sterile fashion. Under direct ultrasound guidance, a peripheral aspect of the right portal vein was accessed with a 22 gauge needle. Un ultrasound image was saved for procedural documentation purposes. The track was dilated with the inner 3 French catheter from the [place] set. Several contrast limited portal venograms were performed. A Nitrex wire was advanced through the 3 French catheter with the radiopaque transition positioned at the target entrance to the right portal vein. Next, the right internal jugular vein was accessed under direct ultrasound. Ultrasound image was saved for procedural documentation purposes. This allowed for placement of the 10 French TIPS vascular sheath. With the  use of a stiff Glidewire, an MPA catheter was utilized to select the right hepatic vein and a hepatic venogram was performed. Under fluoroscopic guidance, the right portal vein with targeted with a TIPS needle directed at the radiopaque target within the right portal vein. Note, despite accessing a branch vessel of the right portal vein several times, a wire was unable to be advanced into the main portal vein. At this point, decision was made to abandoned attempted TIPS creation from the right portal vein. Prolonged efforts were made to cannulate the middle hepatic vein with an MPA, C2 and rim catheter, which was ultimately achieved transiently with the C2 catheter, however the TIPS sheath was unable to be advanced into the middle hepatic vein secondary to abrupt angulation of the takeoff of the middle vein near the confluence of the IVC and atrium. At this point, the procedure was aborted. All wires catheters and sheaths were removed from the patient. Hemostasis was achieved at the right neck and right upper abdominal access sites with manual compression. The paracentesis catheter was removed and superficial hemostasis was achieved with manual compression. Dressings were placed. The patient tolerated the procedure well without immediate postprocedural complication, was extubated and transported to the PACU for recovery. FINDINGS: Ultrasound scanning demonstrates recurrent moderate volume intra- abdominal ascites. Ultrasound-guided paracentesis was performed yielding 3.3 L of ascitic fluid. As demonstrated on prior contrast-enhanced abdominal CT performed 11/11/2016, the patient has non conventional hepatic venous configuration with the right  hepatic vein arising separately from the more caudal aspect of the IVC and a combined origin of both the left and middle hepatic veins from the anterior cranial aspect of the IVC, immediately below the cavoatrial junction. Initial attempts to create a TIPS from the right  hepatic vein ultimately proved unsuccessful as a wire was unable to be advanced centrally from an accessed branch vessel of the right portal vein. The more central aspect of the right portal vein could not be accessed fluoroscopically secondary to abrupt angulation and proximity of attempted access site to the origin of the right hepatic vein. The middle hepatic vein was transiently catheterized, however despite prolonged efforts, the TIPS sheath could not be advanced into the middle hepatic vein secondary to abrupt angulation. IMPRESSION: 1. Attempted though ultimately unsuccessful creation of a TIPS. 2. Successful ultrasound-guided paracentesis yielding 3.3 L of ascitic fluid. PLAN: - Unfortunately, given the shrunken cirrhotic liver and non conventional hepatic venous supply, the patient is not a candidate for TIPS placement. Electronically Signed   By: Sandi Mariscal M.D.   On: 12/14/2016 13:35   Dg Chest Port 1 View  Result Date: 12/24/2016 CLINICAL DATA:  Fever a.  Sepsis. EXAM: PORTABLE CHEST 1 VIEW COMPARISON:  11/13/2016 FINDINGS: The heart is mildly enlarged but stable. Mild tortuosity of the thoracic aorta. The pacer wires are stable. No acute pulmonary findings. The bony thorax is intact. IMPRESSION: Mild stable cardiac enlargement.  No acute pulmonary findings. Electronically Signed   By: Marijo Sanes M.D.   On: 12/24/2016 19:25   Ir Paracentesis  Result Date: 12/14/2016 CLINICAL DATA:  History of alcoholic cirrhosis with recent recurrent symptomatic ascites. Request made for creation of a TIPS. Please refer to formal consultation in the epic EMR. EXAM: 1. ATTEMPTED TRANSJUGULAR INTRAHEPATIC PORTOSYSTEMIC SHUNT 2. IR PARACENTESIS COMPARISON:  CT abdomen and pelvis - 11/11/2016 MEDICATIONS: As antibiotic prophylaxis, Vancomycin 1 gm IV was ordered pre-procedure and administered intravenously within one hour of incision. ANESTHESIA/SEDATION: General - as administered by the Anesthesia department  CONTRAST:  125 cc Isovue-300 FLUOROSCOPY TIME:  Fluoroscopy Time: 55 minutes 18 seconds (667 mGy). COMPLICATIONS: None immediate. PROCEDURE: Informed written consent was obtained from the patient and the patient's wife after a thorough discussion of the procedural risks, benefits and alternatives. All questions were addressed. Maximal Sterile Barrier Technique was utilized including caps, mask, sterile gowns, sterile gloves, sterile drape, hand hygiene and skin antiseptic. A timeout was performed prior to the initiation of the procedure. Initial ultrasound scanning demonstrates a moderate amount of ascites within the right lower abdominal quadrant. The right lower abdomen was prepped and draped in the usual sterile fashion. 1% lidocaine with epinephrine was used for local anesthesia. An ultrasound image was saved for documentation purposed. An 8 Fr Safe-T-Centesis catheter was introduced. The paracentesis was performed as we proceeded with the TIPS creation. The skin overlying the right upper abdominal quadrant as well as the right neck were prepped and draped in usual sterile fashion. Under direct ultrasound guidance, a peripheral aspect of the right portal vein was accessed with a 22 gauge needle. Un ultrasound image was saved for procedural documentation purposes. The track was dilated with the inner 3 French catheter from the [place] set. Several contrast limited portal venograms were performed. A Nitrex wire was advanced through the 3 French catheter with the radiopaque transition positioned at the target entrance to the right portal vein. Next, the right internal jugular vein was accessed under direct ultrasound. Ultrasound image was saved for  procedural documentation purposes. This allowed for placement of the 10 French TIPS vascular sheath. With the use of a stiff Glidewire, an MPA catheter was utilized to select the right hepatic vein and a hepatic venogram was performed. Under fluoroscopic guidance, the  right portal vein with targeted with a TIPS needle directed at the radiopaque target within the right portal vein. Note, despite accessing a branch vessel of the right portal vein several times, a wire was unable to be advanced into the main portal vein. At this point, decision was made to abandoned attempted TIPS creation from the right portal vein. Prolonged efforts were made to cannulate the middle hepatic vein with an MPA, C2 and rim catheter, which was ultimately achieved transiently with the C2 catheter, however the TIPS sheath was unable to be advanced into the middle hepatic vein secondary to abrupt angulation of the takeoff of the middle vein near the confluence of the IVC and atrium. At this point, the procedure was aborted. All wires catheters and sheaths were removed from the patient. Hemostasis was achieved at the right neck and right upper abdominal access sites with manual compression. The paracentesis catheter was removed and superficial hemostasis was achieved with manual compression. Dressings were placed. The patient tolerated the procedure well without immediate postprocedural complication, was extubated and transported to the PACU for recovery. FINDINGS: Ultrasound scanning demonstrates recurrent moderate volume intra- abdominal ascites. Ultrasound-guided paracentesis was performed yielding 3.3 L of ascitic fluid. As demonstrated on prior contrast-enhanced abdominal CT performed 11/11/2016, the patient has non conventional hepatic venous configuration with the right hepatic vein arising separately from the more caudal aspect of the IVC and a combined origin of both the left and middle hepatic veins from the anterior cranial aspect of the IVC, immediately below the cavoatrial junction. Initial attempts to create a TIPS from the right hepatic vein ultimately proved unsuccessful as a wire was unable to be advanced centrally from an accessed branch vessel of the right portal vein. The more central  aspect of the right portal vein could not be accessed fluoroscopically secondary to abrupt angulation and proximity of attempted access site to the origin of the right hepatic vein. The middle hepatic vein was transiently catheterized, however despite prolonged efforts, the TIPS sheath could not be advanced into the middle hepatic vein secondary to abrupt angulation. IMPRESSION: 1. Attempted though ultimately unsuccessful creation of a TIPS. 2. Successful ultrasound-guided paracentesis yielding 3.3 L of ascitic fluid. PLAN: - Unfortunately, given the shrunken cirrhotic liver and non conventional hepatic venous supply, the patient is not a candidate for TIPS placement. Electronically Signed   By: Sandi Mariscal M.D.   On: 12/14/2016 13:35       Subjective: No abdominal pain. No cough. Feeling well  Discharge Exam: Vitals:   12/26/16 0610 12/26/16 1341  BP: 113/69 111/73  Pulse: 74 (!) 106  Resp: 16 16  Temp: 98.3 F (36.8 C) 97.8 F (36.6 C)   Vitals:   12/25/16 1625 12/25/16 2224 12/26/16 0610 12/26/16 1341  BP: 116/73 114/70 113/69 111/73  Pulse: 99 78 74 (!) 106  Resp: 16 16 16 16   Temp: 98.4 F (36.9 C) 97.4 F (36.3 C) 98.3 F (36.8 C) 97.8 F (36.6 C)  TempSrc: Oral Oral Oral Oral  SpO2: 94% 98% 98% 93%  Weight:      Height:        General: Pt is alert, awake, not in acute distress Cardiovascular: RRR, S1/S2 +, no rubs, no gallops Respiratory: CTA  bilaterally, no wheezing, no rhonchi Abdominal: Soft, NT, distended, bowel sounds + Extremities: no edema, no cyanosis    The results of significant diagnostics from this hospitalization (including imaging, microbiology, ancillary and laboratory) are listed below for reference.     Microbiology: Recent Results (from the past 240 hour(s))  Blood Culture (routine x 2)     Status: None (Preliminary result)   Collection Time: 12/23/16  4:23 PM  Result Value Ref Range Status   Specimen Description LEFT ANTECUBITAL  Final    Special Requests   Final    BOTTLES DRAWN AEROBIC AND ANAEROBIC Blood Culture adequate volume   Culture NO GROWTH 3 DAYS  Final   Report Status PENDING  Incomplete  Blood Culture (routine x 2)     Status: None (Preliminary result)   Collection Time: 12/24/16  6:01 AM  Result Value Ref Range Status   Specimen Description BLOOD LEFT ARM  Final   Special Requests   Final    BOTTLES DRAWN AEROBIC AND ANAEROBIC Blood Culture adequate volume   Culture NO GROWTH 2 DAYS  Final   Report Status PENDING  Incomplete     Labs: BNP (last 3 results) No results for input(s): BNP in the last 8760 hours. Basic Metabolic Panel:  Recent Labs Lab 12/23/16 0952 12/24/16 0601 12/25/16 0557 12/26/16 0610  NA 135 137 136 134*  K 5.4* 3.4* 3.4* 4.7  CL 98* 102 103 104  CO2 22 26 23 23   GLUCOSE 245* 98 83 117*  BUN 40* 29* 25* 21*  CREATININE 1.50* 1.13 0.97 0.91  CALCIUM 9.3 8.6* 8.3* 8.4*   Liver Function Tests:  Recent Labs Lab 12/23/16 0952 12/24/16 0601  AST 59* 44*  ALT 54 41  ALKPHOS 406* 325*  BILITOT 1.6* 1.0  PROT 6.5 5.9*  ALBUMIN 3.2* 3.0*    Recent Labs Lab 12/23/16 0952  LIPASE 18    Recent Labs Lab 12/23/16 1339  AMMONIA 17   CBC:  Recent Labs Lab 12/23/16 0952 12/24/16 0601 12/25/16 0557  WBC 15.7* 12.2* 10.1  NEUTROABS 12.7*  --   --   HGB 13.9 11.7* 10.4*  HCT 42.8 36.9* 31.9*  MCV 94.9 95.3 93.5  PLT 514* 343 299   Cardiac Enzymes: No results for input(s): CKTOTAL, CKMB, CKMBINDEX, TROPONINI in the last 168 hours. BNP: Invalid input(s): POCBNP CBG:  Recent Labs Lab 12/25/16 1113 12/25/16 1620 12/25/16 2103 12/26/16 0748 12/26/16 1152  GLUCAP 171* 159* 152* 107* 119*   D-Dimer No results for input(s): DDIMER in the last 72 hours. Hgb A1c No results for input(s): HGBA1C in the last 72 hours. Lipid Profile No results for input(s): CHOL, HDL, LDLCALC, TRIG, CHOLHDL, LDLDIRECT in the last 72 hours. Thyroid function studies No results  for input(s): TSH, T4TOTAL, T3FREE, THYROIDAB in the last 72 hours.  Invalid input(s): FREET3 Anemia work up No results for input(s): VITAMINB12, FOLATE, FERRITIN, TIBC, IRON, RETICCTPCT in the last 72 hours. Urinalysis    Component Value Date/Time   COLORURINE AMBER (A) 12/24/2016 2100   APPEARANCEUR HAZY (A) 12/24/2016 2100   LABSPEC 1.033 (H) 12/24/2016 2100   PHURINE 5.0 12/24/2016 2100   GLUCOSEU NEGATIVE 12/24/2016 2100   HGBUR NEGATIVE 12/24/2016 2100   [place] NEGATIVE 12/24/2016 2100   [place] NEGATIVE 12/24/2016 2100   PROTEINUR NEGATIVE 12/24/2016 2100   UROBILINOGEN 0.2 05/13/2015 1315   NITRITE NEGATIVE 12/24/2016 2100   LEUKOCYTESUR SMALL (A) 12/24/2016 2100   Sepsis Labs Invalid input(s): PROCALCITONIN,  WBC,  [place] [facility] Recent Results (from the past 240 hour(s))  Blood Culture (routine x 2)     Status: None (Preliminary result)   Collection Time: 12/23/16  4:23 PM  Result Value Ref Range Status   Specimen Description LEFT ANTECUBITAL  Final   Special Requests   Final    BOTTLES DRAWN AEROBIC AND ANAEROBIC Blood Culture adequate volume   Culture NO GROWTH 3 DAYS  Final   Report Status PENDING  Incomplete  Blood Culture (routine x 2)     Status: None (Preliminary result)   Collection Time: 12/24/16  6:01 AM  Result Value Ref Range Status   Specimen Description BLOOD LEFT ARM  Final   Special Requests   Final    BOTTLES DRAWN AEROBIC AND ANAEROBIC Blood Culture adequate volume   Culture NO GROWTH 2 DAYS  Final   Report Status PENDING  Incomplete     Time coordinating discharge: Over 30 minutes  SIGNED:   Kathie Dike, MD  [facility] 12/26/2016, 3:44 PM Pager   If 7PM-7AM, please contact night-coverage [email or web address] Password [email or web address]

## 2016-12-27 ENCOUNTER — Other Ambulatory Visit: Payer: Self-pay | Admitting: Nurse Practitioner

## 2016-12-27 ENCOUNTER — Ambulatory Visit (HOSPITAL_COMMUNITY)
Admission: RE | Admit: 2016-12-27 | Discharge: 2016-12-27 | Disposition: A | Discharge status: Home / Self Care | Payer: Medicare Other | Source: Ambulatory Visit | Attending: Cardiology | Admitting: Cardiology

## 2016-12-27 ENCOUNTER — Other Ambulatory Visit (HOSPITAL_COMMUNITY)
Admission: RE | Admit: 2016-12-27 | Discharge: 2016-12-27 | Disposition: A | Discharge status: Home / Self Care | Payer: Medicare Other | Source: Ambulatory Visit | Attending: Cardiology | Admitting: Cardiology

## 2016-12-27 ENCOUNTER — Ambulatory Visit (HOSPITAL_COMMUNITY)
Admission: RE | Admit: 2016-12-27 | Discharge: 2016-12-27 | Disposition: A | Discharge status: Home / Self Care | Payer: Medicare Other | Source: Ambulatory Visit | Attending: Nurse Practitioner | Admitting: Nurse Practitioner

## 2016-12-27 ENCOUNTER — Ambulatory Visit (INDEPENDENT_AMBULATORY_CARE_PROVIDER_SITE_OTHER): Payer: Medicare Other | Admitting: Cardiology

## 2016-12-27 ENCOUNTER — Encounter: Payer: Self-pay | Admitting: Cardiology

## 2016-12-27 ENCOUNTER — Encounter (HOSPITAL_COMMUNITY): Payer: Self-pay

## 2016-12-27 VITALS — BP 112/72 | HR 84 | Ht 63.0 in | Wt 150.0 lb

## 2016-12-27 DIAGNOSIS — I4892 Unspecified atrial flutter: Secondary | ICD-10-CM | POA: Diagnosis not present

## 2016-12-27 DIAGNOSIS — I4891 Unspecified atrial fibrillation: Secondary | ICD-10-CM | POA: Insufficient documentation

## 2016-12-27 DIAGNOSIS — K219 Gastro-esophageal reflux disease without esophagitis: Secondary | ICD-10-CM | POA: Diagnosis not present

## 2016-12-27 DIAGNOSIS — Z01810 Encounter for preprocedural cardiovascular examination: Secondary | ICD-10-CM | POA: Diagnosis not present

## 2016-12-27 DIAGNOSIS — R188 Other ascites: Secondary | ICD-10-CM

## 2016-12-27 DIAGNOSIS — E785 Hyperlipidemia, unspecified: Secondary | ICD-10-CM | POA: Diagnosis not present

## 2016-12-27 DIAGNOSIS — I10 Essential (primary) hypertension: Secondary | ICD-10-CM | POA: Insufficient documentation

## 2016-12-27 DIAGNOSIS — G4730 Sleep apnea, unspecified: Secondary | ICD-10-CM | POA: Insufficient documentation

## 2016-12-27 DIAGNOSIS — I350 Nonrheumatic aortic (valve) stenosis: Secondary | ICD-10-CM

## 2016-12-27 DIAGNOSIS — I082 Rheumatic disorders of both aortic and tricuspid valves: Secondary | ICD-10-CM | POA: Insufficient documentation

## 2016-12-27 DIAGNOSIS — E119 Type 2 diabetes mellitus without complications: Secondary | ICD-10-CM | POA: Diagnosis not present

## 2016-12-27 LAB — ECHOCARDIOGRAM COMPLETE
Height: 63 in
WEIGHTICAEL: 2400 [oz_av]

## 2016-12-27 LAB — MAGNESIUM: MAGNESIUM: 1.9 mg/dL (ref 1.7–2.4)

## 2016-12-27 MED ORDER — ALBUMIN HUMAN 25 % IV SOLN
INTRAVENOUS | Status: AC
  Administered 2016-12-27: 25 g via INTRAVENOUS
  Filled 2016-12-27: qty 100

## 2016-12-27 MED ORDER — ALBUMIN HUMAN 25 % IV SOLN
25.0000 g | Freq: Once | INTRAVENOUS | Status: AC
  Administered 2016-12-27: 25 g via INTRAVENOUS

## 2016-12-27 NOTE — Progress Notes (Signed)
Paracentesis complete no signs of distress. 2.2L red colored ascites removed.

## 2016-12-27 NOTE — Progress Notes (Signed)
Clinical Summary Mr. Lauder is a 56 y.o.male seen today for follow up of the following medical problems.   1. Atrial flutter  - denies any palpitations, he is on metoprolol for rate control - long term history of nose bleeds. We tried several anticoag agents, however all severely increased his nose bleeds requiring ER visits and nose packing. He states he has seen ENT before and told he has a bleeding vessel that there is nothing can be done for.  - still with occasional nose bleeds  - no recent palpitations. Has not been on anticoag due to severe nose bleeds in the past. Now with severe cirrhosis.   2. Aortic stenosis - moderate to severe AS by last echo.  - denies any chest pain, SOB, lightheadedness, or syncope   3. Tachy-Brady syndrme - denies any lightheadess or dizziness since last visit  4. HTN - he is compliant with meds - checks blood pressures regularly, typically 120s/70s  5. DM - followed by pcp  6. Sepsis - discharge yesterday, admitted with fevers and sepsis  7. Cirrhosis - receiving weekly paracentesis - recent failed attempt at TIPs. Being considered for repeat trial.   8. COPD - on home O2  9 . Chronic diastolic HF - 04/1190 echo" LVEF 55-60%, abnormal diastolic function, moderate to severe AS - no recent edema.   10. Preoperative evaluation - being considered for repeat TIPs procedure.   Past Medical History:  Diagnosis Date  . Alcohol use   . Anemia   . Aortic stenosis    mild  . Arthritis   . Atrial flutter ([place])   . Benign hypertrophy of prostate    with urinary retention; repaired hypospadia; transurethral resection of the prostate   . Cardiac conduction disorder    status post pacemaker implantation in 1995 generator replaced 2005  . Chest pain 2007, 2009   cardiac cath > normal coronary arterie; nl left ventricular function  . CHF (congestive heart failure) ([place])   . Cirrhosis ([place]) 11/2016  . Congenital deafness   .  COPD (chronic obstructive pulmonary disease) ([place])   . Diabetes mellitus    +Insulin  . Dyspnea   . Edema   . GERD (gastroesophageal reflux disease)    status post multiple dilatations for stricture  . 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 Hepatitis    Thinks it was B  . History of kidney stones   . HTN (hypertension)   . Hyperlipidemia    statin discontinued due to abn LFTs  . Hypothyroidism   . Mitral regurgitation    insignificant  . Obesity   . Obesity 4/08   BMI= 40  . Pelvic mass    identified in 2009, stable 2010  . PONV (postoperative nausea and vomiting)   . Presence of permanent cardiac pacemaker   . Sleep apnea    BiPAP and continuous oxygen  . Syncope   . Thyroid disease      Allergies  Allergen Reactions  . Enalapril Cough  . Metformin And Related Diarrhea  . Eliquis [Apixaban] Other (See Comments)    DOSE RELATED BLEEDING  . Penicillins Other (See Comments)    Reaction unspecified/childhood allergy Has patient had a PCN reaction causing immediate rash, facial/tongue/throat swelling, SOB or lightheadedness with hypotension: Yes Has patient had a PCN reaction causing severe rash involving mucus membranes or skin necrosis: No Has patient had a PCN reaction  that required hospitalization No Has patient had a PCN reaction occurring within the last 10 years: No If all of the above answers are "NO", then may proceed with Cephalosporin use.     Current Outpatient Prescriptions  Medication Sig Dispense Refill  . acetaminophen (TYLENOL) 325 MG tablet Take 650 mg by mouth every 6 (six) hours as needed for mild pain.    Marland Kitchen albuterol (PROVENTIL HFA;VENTOLIN HFA) 108 (90 Base) MCG/ACT inhaler Inhale 2 puffs into the lungs every 6 (six) hours as needed for wheezing or shortness of breath.    Marland Kitchen albuterol (PROVENTIL) (2.5 MG/3ML) 0.083% nebulizer solution Take 2.5 mg by nebulization every 6 (six) hours as  needed for wheezing or shortness of breath.    Marland Kitchen aspirin 325 MG EC tablet Take 325 mg by mouth daily.    . cetirizine (ZYRTEC) 10 MG tablet Take 10 mg by mouth daily.    . furosemide (LASIX) 20 MG tablet Take 1 tablet (20 mg total) by mouth daily. Take along with spironolactone 90 tablet 3  . gentamicin ointment (GARAMYCIN) 0.1 % Apply 1 application topically at bedtime. Inside nose     . Glucosamine-Chondroit-Vit C-Mn (GLUCOSAMINE CHONDR 1500 COMPLX PO) Take 1 tablet by mouth every morning.    Marland Kitchen HUMALOG MIX 75/25 KWIKPEN (75-25) 100 UNIT/ML Kwikpen Inject 8 Units into the skin 2 (two) times daily. 15 units in the morning and 15 units in the evening (Patient taking differently: Inject 8 Units into the skin 2 (two) times daily. ) 10 mL 0  . lactulose (CHRONULAC) 10 GM/15ML solution 10cc (2 teaspoons) bid; Titrate to 2-3 semi formed stools a day (Patient taking differently: Take 10 g by mouth 2 (two) times daily. 10cc (2 teaspoons) bid; Titrate to 2-3 semi formed stools a day) 473 mL 11  . levofloxacin (LEVAQUIN) 750 MG tablet Take 1 tablet (750 mg total) by mouth daily. 3 tablet 0  . levothyroxine (LEVOXYL) 50 MCG tablet Take 50 mcg by mouth daily before breakfast.     . modafinil (PROVIGIL) 200 MG tablet Take 200 mg by mouth daily.     . mometasone (NASONEX) 50 MCG/ACT nasal spray Place 2 sprays into the nose daily as needed (FOR CONGESTION/ALLERGIES).    . Multiple Vitamins-Minerals (CENTRUM) tablet Take 1 tablet by mouth daily.      . nitroGLYCERIN (NITROSTAT) 0.4 MG SL tablet Place 1 tablet (0.4 mg total) under the tongue every 5 (five) minutes as needed for chest pain. 25 tablet 3  . NON FORMULARY Bipap 16.0/e    . Omega-3 Fatty Acids (FISH OIL) 1000 MG CAPS Take 1 capsule by mouth every other day.     Marland Kitchen omeprazole (PRILOSEC) 40 MG capsule TAKE 1 CAPSULE BY MOUTH ONCE DAILY FOR REFLUX. 30 capsule 1  . spironolactone (ALDACTONE) 50 MG tablet TAKE 1 TABLET BY MOUTH ONCE DAILY. 30 tablet 1  .  ursodiol (ACTIGALL) 500 MG tablet TAKE 1 TABLET BY MOUTH TWICE DAILY 60 tablet 11   No current facility-administered medications for this visit.      Past Surgical History:  Procedure Laterality Date  . A-V CARDIAC PACEMAKER INSERTION  1995  . COLONOSCOPY  2008  . COLONOSCOPY N/A 06/23/2015   SHF:WYOVZCH diverticulosis  . ESOPHAGOGASTRODUODENOSCOPY N/A 06/23/2015   YIF:OYDX portal gastropathy  . IR GENERIC HISTORICAL  12/14/2016   IR PARACENTESIS 12/14/2016 Sandi Mariscal, MD [facility]INTER[facility]  . IR GENERIC HISTORICAL  12/14/2016   IR TIPS 12/14/2016 Sandi Mariscal, MD [facility]  .  NASAL SINUS SURGERY     partially removed  . ORCHIECTOMY  1990   bilateral; ?neoplasm  . PACEMAKER INSERTION  05/2004; 10/04/2013   MDT dual chamber pacemaker; gen change 10/04/2013 (MDT ADDRL1)  . PERMANENT PACEMAKER GENERATOR CHANGE N/A 10/04/2013   Procedure: PERMANENT PACEMAKER GENERATOR CHANGE;  Surgeon: Evans Lance, MD;  Location: [facility] CATH LAB;  Service: Cardiovascular;  Laterality: N/A;  . RADIOLOGY WITH ANESTHESIA N/A 12/14/2016   Procedure: TIPS;  Surgeon: Sandi Mariscal, MD;  Location: [place];  Service: Radiology;  Laterality: N/A;  . REPAIR HYPOSPADIAS W/ URETHROPLASTY    . TONSILLECTOMY AND ADENOIDECTOMY    . TRANSURETHRAL RESECTION OF PROSTATE     for BPH     Allergies  Allergen Reactions  . Enalapril Cough  . Metformin And Related Diarrhea  . Eliquis [Apixaban] Other (See Comments)    DOSE RELATED BLEEDING  . Penicillins Other (See Comments)    Reaction unspecified/childhood allergy Has patient had a PCN reaction causing immediate rash, facial/tongue/throat swelling, SOB or lightheadedness with hypotension: Yes Has patient had a PCN reaction causing severe rash involving mucus membranes or skin necrosis: No Has patient had a PCN reaction that required hospitalization No Has patient had a PCN reaction occurring within the last 10 years: No If all of the above answers are "NO", then may proceed with  Cephalosporin use.      Family History  Problem Relation Age of Onset  . COPD Mother   . Hypertension Mother   . Congestive Heart Failure Mother   . Pneumonia Father   . Heart disease Other   . Hypertension Other   . Colon cancer Neg Hx      Social History Mr. Martel reports that he has never smoked. He has never used smokeless tobacco. Mr. Kranz reports that he does not drink alcohol.   Review of Systems CONSTITUTIONAL: No weight loss, fever, chills, weakness or fatigue.  HEENT: Eyes: No visual loss, blurred vision, double vision or yellow sclerae.No hearing loss, sneezing, congestion, runny nose or sore throat.  SKIN: No rash or itching.  CARDIOVASCULAR: per HPI RESPIRATORY: No shortness of breath, cough or sputum.  GASTROINTESTINAL: No anorexia, nausea, vomiting or diarrhea. No abdominal pain or blood.  GENITOURINARY: No burning on urination, no polyuria NEUROLOGICAL: No headache, dizziness, syncope, paralysis, ataxia, numbness or tingling in the extremities. No change in bowel or bladder control.  MUSCULOSKELETAL: No muscle, back pain, joint pain or stiffness.  LYMPHATICS: No enlarged nodes. No history of splenectomy.  PSYCHIATRIC: No history of depression or anxiety.  ENDOCRINOLOGIC: No reports of sweating, cold or heat intolerance. No polyuria or polydipsia.  Marland Kitchen   Physical Examination Vitals:   12/27/16 1048  BP: 112/72  Pulse: 84   Vitals:   12/27/16 1048  Weight: 150 lb (68 kg)  Height: 5\' 3"  (1.6 m)    Gen: resting comfortably, no acute distress HEENT: no scleral icterus, pupils equal round and reactive, no palptable cervical adenopathy,  CV: irreg, 3/6 systolic murmur RUSB, no JVD Resp: Clear to auscultation bilaterally GI: abdomen is soft, non-tender, non-distended, normal bowel sounds, no hepatosplenomegaly MSK: extremities are warm, no edema.  Skin: warm, no rash Neuro:  no focal deficits Psych: appropriate affect   Diagnostic  Studies 02/2013 Echo limited evaluate AS: mild AS (mean grad 16   01/2013 Echo: difficult study, LV function mildly reduced ( no percentage given), possible moderate to severe AS (AVA .53) mean grad 27 mmHg.   07/30/13 Clinic EKG:  sinus rhythm, 1st degree AV block, RBBB   Jan 2015 Echo  LVEF 55-60%, cannot eval diastolic function, mod AS (area 1, mean grad 20).    02/2014 Carotid US IMPRESSION: No evidence of internal carotid artery stenosis.  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     Assessment and Plan  1. Atrial flutter  - he will continue rate control  - Severe nose bleeds on several anticoag agents, now with severe cirrhosis. No anticoag at this time.   2. Aortic stenosis - moderate to severe by echo last year - will repeat echo in light of possible upcoming TIPS surgery.   4. Tachy-Brady syndrome - no current symptoms, normal pacer function on last check.  - continue to monitor.   5. DM - management per pcp  6. HTN - bp is at goal, continue current meds   7. Preoperative evaluation - being considered for repeat TIPS procedure.  - his overall poor health related to multiple comorbidities and poor conditioning increases risk for any procedure. He has moderate to severe AS that we will repeat echo for to help gauge risk further, however unless critical AS is present would not see this as prohibitive given his rapidly advancing liver failure and indications for TIPS     Addendum Echo completed today. AS remains moderate to severe. There is some increased risk for planned procedure but not prohibitive, I would recommend proceeding from a cardiac standpoint   Arnoldo Lenis, M.D

## 2016-12-27 NOTE — Patient Instructions (Signed)
Your physician recommends that you schedule a follow-up appointment in: 3 Months with Dr. Harl Bowie  Your physician recommends that you continue on your current medications as directed. Please refer to the Current Medication list given to you today.  Your physician has requested that you have an echocardiogram. Echocardiography is a painless test that uses sound waves to create images of your heart. It provides your doctor with information about the size and shape of your heart and how well your heart's chambers and valves are working. This procedure takes approximately one hour. There are no restrictions for this procedure.   If you need a refill on your cardiac medications before your next appointment, please call your pharmacy.  Thank you for choosing [facility]!

## 2016-12-27 NOTE — Progress Notes (Signed)
*  PRELIMINARY RESULTS* Echocardiogram 2D Echocardiogram has been performed.  [name] 12/27/2016, 3:17 PM

## 2016-12-27 NOTE — Procedures (Signed)
PreOperative Dx: Cirrhosis, ascites Postoperative Dx: Cirrhosis, ascites Procedure:   US guided paracentesis Radiologist:  Thornton Papas Anesthesia:  10 ml of1% lidocaine Specimen:  2.2 L of dark old bloody ascitic fluid EBL:   < 1 ml Complications: None

## 2016-12-28 LAB — CULTURE, BLOOD (ROUTINE X 2)
Culture: NO GROWTH
Special Requests: ADEQUATE

## 2016-12-29 ENCOUNTER — Ambulatory Visit (HOSPITAL_COMMUNITY): Payer: Medicare Other

## 2016-12-29 LAB — CULTURE, BLOOD (ROUTINE X 2)
Culture: NO GROWTH
SPECIAL REQUESTS: ADEQUATE

## 2016-12-30 ENCOUNTER — Telehealth: Payer: Self-pay

## 2016-12-30 NOTE — Telephone Encounter (Signed)
Cassandra Dillard- case Freight forwarder for [facility] [employer] called. She said the patient is in their heart healthy program and has to weigh daily and inform them of his results. She said his weight 2 days ago was 128.6 and today it is 138.6. Pt has gained 10 pounds in 2 days. Pt had para on 12/27/16, pt and pts wife tell me that the pt made them stop drawing off fluid at 2 liters. Pt said he didn't know why he asked them to stop. He said it wasn't hurting, he just told them to stop. He is not having any SOB or any other problems right now. He is scheduled for another para Wednesday 01/05/17. Do we need to get him in sooner?

## 2016-12-31 NOTE — Telephone Encounter (Signed)
Tried to call pt- [id number] Please schedule ov.

## 2016-12-31 NOTE — Telephone Encounter (Signed)
Is he taking Lasix 20 mg and aldactone 50 mg daily? Needs to follow a strict 2 gram sodium diet. We may be able to titrate up his diuretics, because those are extremely low doses. Needs OV.

## 2016-12-31 NOTE — Telephone Encounter (Signed)
Spoke with Jeannene Patella, she said he is taking his fluid pills as prescribed.

## 2017-01-01 ENCOUNTER — Telehealth: Payer: Self-pay | Admitting: Internal Medicine

## 2017-01-01 NOTE — Telephone Encounter (Signed)
Wife called yesterday afternoon. States patient's gained 15 pounds since the first of the week. Had a tap done earlier in the week.. States for some reason patient told ultrasound staff not to take as much off. They complied reportedly.  Wife further states the patient is noncompliant with his fluid intake.  I recommended compliance with fluid/salt intake. My office staff will arrange to get him an ultrasound first of the week and see if he would benefit from paracentesis.

## 2017-01-03 ENCOUNTER — Ambulatory Visit (HOSPITAL_COMMUNITY)
Admission: RE | Admit: 2017-01-03 | Discharge: 2017-01-03 | Disposition: A | Discharge status: Home / Self Care | Payer: Medicare Other | Source: Ambulatory Visit | Attending: Nurse Practitioner | Admitting: Nurse Practitioner

## 2017-01-03 ENCOUNTER — Encounter: Payer: Self-pay | Admitting: Internal Medicine

## 2017-01-03 NOTE — Telephone Encounter (Signed)
Called [employer]. Para rescheduled for today. [facility] at 11:00am. Tried to call pt mobile and home numbers. No answer. LMOVM and LMOAM.

## 2017-01-03 NOTE — Telephone Encounter (Signed)
APPT MADE AND LETTER SENT  °

## 2017-01-03 NOTE — Telephone Encounter (Signed)
Pt's wife called office. She had called the hospital and they told her it was too late to have para done for today. They rescheduled para for 01/05/17. His wife thinks he will be ok until then as long as he abides by RMRs instructions.

## 2017-01-03 NOTE — Telephone Encounter (Signed)
Please schedule para.

## 2017-01-05 ENCOUNTER — Ambulatory Visit (HOSPITAL_COMMUNITY): Payer: Medicare Other

## 2017-01-05 ENCOUNTER — Encounter (HOSPITAL_COMMUNITY): Payer: Self-pay

## 2017-01-05 ENCOUNTER — Ambulatory Visit (HOSPITAL_COMMUNITY)
Admission: RE | Admit: 2017-01-05 | Discharge: 2017-01-05 | Disposition: A | Discharge status: Home / Self Care | Payer: Medicare Other | Source: Ambulatory Visit | Attending: Nurse Practitioner | Admitting: Nurse Practitioner

## 2017-01-05 DIAGNOSIS — R188 Other ascites: Secondary | ICD-10-CM | POA: Insufficient documentation

## 2017-01-05 NOTE — Progress Notes (Signed)
Paracentesis complete no signs of distress.  

## 2017-01-06 ENCOUNTER — Ambulatory Visit
Admission: RE | Admit: 2017-01-06 | Discharge: 2017-01-06 | Disposition: A | Discharge status: Home / Self Care | Payer: Medicare Other | Source: Ambulatory Visit | Attending: Radiology | Admitting: Radiology

## 2017-01-06 DIAGNOSIS — R188 Other ascites: Secondary | ICD-10-CM

## 2017-01-06 DIAGNOSIS — K7460 Unspecified cirrhosis of liver: Secondary | ICD-10-CM

## 2017-01-06 HISTORY — PX: IR RADIOLOGIST EVAL & MGMT: IMG5224

## 2017-01-06 NOTE — Progress Notes (Signed)
Patient ID: [name], male   DOB: 11-09-1960, 56 y.o.   MRN: [id number]         Chief Complaint: Post attempted TIPS creation  Referring Physician(s): Roseanne Kaufman  History of Present Illness:  [name] is a 56 y.o. male with complex medical history significant for congenital deafness, hypertension, hyperlipidemia, aortic stenosis, mitral regurgitation, atrial flutter, post pacemaker insertion, COPD (on 2 L via nasal cannula continuously), obstructive sleep apnea necessitating BiPAP diabetes, anemia, history of alcohol abuse and a combination of alcoholic and NASH cirrhosis who initially presented to the interventional radiology clinic for evaluation of potential TIPS creation on 11/30/2016.  Attempted TIPS creation was performed on 12/14/2016, however the procedure was unsuccessful secondary to abrupt / severe angulation of the right hepatic vein and right portal veins, as well as a tortuous origin involving the conjoined middle and left hepatic veins at the level of the inferior cavoatrial junction.  He returns to the interventional radiology clinic today for postprocedural evaluation and management. He is accompanied by his wife.  Since undergoing the attempted TIPS creation, the patient has undergone 3 subsequent ultrasound-guided paracentesis, most recently on 01/05/2017, yielding 6.4 L.  Note, patient underwent a CT scan of the abdomen and pelvis on 12/23/2016 for the workup of abdominal pain which was negative for complication following the attempted TIPS creation.  [name] symptomology is unchanged.  As previously documented, patient reports a solitary episode of potential altered mental status approximately 1 month ago. Otherwise, there is no complaint of encephalopathy. No yellowing of the skin or eyes. [name] activity level has been limited given his abdominal discomfort associated with the ascites however he is otherwise without complaint. No fever or chills.  No nausea or vomiting. No hematemesis. No bloody or melanotic stools. No change in urinary function.  The patient ambulates in the community with the use of a walker. Patient is able to walk to his mailbox and back. No recent falls.   Past Medical History:  Diagnosis Date  . Alcohol use   . Anemia   . Aortic stenosis    mild  . Arthritis   . Atrial flutter ([place])   . Benign hypertrophy of prostate    with urinary retention; repaired hypospadia; transurethral resection of the prostate   . Cardiac conduction disorder    status post pacemaker implantation in 1995 generator replaced 2005  . Chest pain 2007, 2009   cardiac cath > normal coronary arterie; nl left ventricular function  . CHF (congestive heart failure) ([place])   . Cirrhosis ([place]) 11/2016  . Congenital deafness   . COPD (chronic obstructive pulmonary disease) ([place])   . Diabetes mellitus    +Insulin  . Dyspnea   . Edema   . GERD (gastroesophageal reflux disease)    status post multiple dilatations for stricture  . 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 Hepatitis    Thinks it was B  . History of kidney stones   . HTN (hypertension)   . Hyperlipidemia    statin discontinued due to abn LFTs  . Hypothyroidism   . Mitral regurgitation    insignificant  . Obesity   . Obesity 4/08   BMI= 40  . Pelvic mass    identified in 2009, stable 2010  . PONV (postoperative nausea and vomiting)   . Presence of permanent cardiac pacemaker   . Sleep apnea    BiPAP and  continuous oxygen  . Syncope   . Thyroid disease     Past Surgical History:  Procedure Laterality Date  . A-V CARDIAC PACEMAKER INSERTION  1995  . COLONOSCOPY  2008  . COLONOSCOPY N/A 06/23/2015   FKC:LEXNTZG diverticulosis  . ESOPHAGOGASTRODUODENOSCOPY N/A 06/23/2015   YFV:CBSW portal gastropathy  . IR GENERIC HISTORICAL  12/14/2016   IR PARACENTESIS 12/14/2016 Sandi Mariscal, MD [facility]INTER[facility]  . IR  GENERIC HISTORICAL  12/14/2016   IR TIPS 12/14/2016 Sandi Mariscal, MD [facility]  . NASAL SINUS SURGERY     partially removed  . ORCHIECTOMY  1990   bilateral; ?neoplasm  . PACEMAKER INSERTION  05/2004; 10/04/2013   MDT dual chamber pacemaker; gen change 10/04/2013 (MDT ADDRL1)  . PERMANENT PACEMAKER GENERATOR CHANGE N/A 10/04/2013   Procedure: PERMANENT PACEMAKER GENERATOR CHANGE;  Surgeon: Evans Lance, MD;  Location: [facility] CATH [facility];  Service: Cardiovascular;  Laterality: N/A;  . RADIOLOGY WITH ANESTHESIA N/A 12/14/2016   Procedure: TIPS;  Surgeon: Sandi Mariscal, MD;  Location: [place];  Service: Radiology;  Laterality: N/A;  . REPAIR HYPOSPADIAS W/ URETHROPLASTY    . TONSILLECTOMY AND ADENOIDECTOMY    . TRANSURETHRAL RESECTION OF PROSTATE     for BPH    Allergies: Enalapril; Metformin and related; Eliquis [apixaban]; and Penicillins  Medications: Prior to Admission medications   Medication Sig Start Date End Date Taking? Authorizing Provider  acetaminophen (TYLENOL) 325 MG tablet Take 650 mg by mouth every 6 (six) hours as needed for mild pain.    Historical Provider, MD  albuterol (PROVENTIL HFA;VENTOLIN HFA) 108 (90 Base) MCG/ACT inhaler Inhale 2 puffs into the lungs every 6 (six) hours as needed for wheezing or shortness of breath.    Historical Provider, MD  albuterol (PROVENTIL) (2.5 MG/3ML) 0.083% nebulizer solution Take 2.5 mg by nebulization every 6 (six) hours as needed for wheezing or shortness of breath.    Historical Provider, MD  aspirin 325 MG EC tablet Take 325 mg by mouth daily.    Historical Provider, MD  cetirizine (ZYRTEC) 10 MG tablet Take 10 mg by mouth daily.    Historical Provider, MD  furosemide (LASIX) 20 MG tablet Take 1 tablet (20 mg total) by mouth daily. Take along with spironolactone 11/22/16   Annitta Needs, NP  gentamicin ointment (GARAMYCIN) 0.1 % Apply 1 application topically at bedtime. Inside nose     Historical Provider, MD  Glucosamine-Chondroit-Vit C-Mn  (GLUCOSAMINE CHONDR 1500 COMPLX PO) Take 1 tablet by mouth every morning.    Historical Provider, MD  HUMALOG MIX 75/25 KWIKPEN (75-25) 100 UNIT/ML Kwikpen Inject 8 Units into the skin 2 (two) times daily. 15 units in the morning and 15 units in the evening Patient taking differently: Inject 8 Units into the skin 2 (two) times daily.  11/14/16   Eber Jones, MD  lactulose (CHRONULAC) 10 GM/15ML solution 10cc (2 teaspoons) bid; Titrate to 2-3 semi formed stools a day Patient taking differently: Take 10 g by mouth 2 (two) times daily. 10cc (2 teaspoons) bid; Titrate to 2-3 semi formed stools a day 11/14/16   Eber Jones, MD  levofloxacin (LEVAQUIN) 750 MG tablet Take 1 tablet (750 mg total) by mouth daily. 12/26/16   Kathie Dike, MD  levothyroxine (LEVOXYL) 50 MCG tablet Take 50 mcg by mouth daily before breakfast.     Historical Provider, MD  modafinil (PROVIGIL) 200 MG tablet Take 200 mg by mouth daily.  01/13/15   Historical Provider, MD  mometasone (NASONEX)  50 MCG/ACT nasal spray Place 2 sprays into the nose daily as needed (FOR CONGESTION/ALLERGIES).    Historical Provider, MD  Multiple Vitamins-Minerals (CENTRUM) tablet Take 1 tablet by mouth daily.      Historical Provider, MD  nitroGLYCERIN (NITROSTAT) 0.4 MG SL tablet Place 1 tablet (0.4 mg total) under the tongue every 5 (five) minutes as needed for chest pain. 06/26/15   Lendon Colonel, NP  NON FORMULARY Bipap 16.0/e    Historical Provider, MD  Omega-3 Fatty Acids (FISH OIL) 1000 MG CAPS Take 1 capsule by mouth every other day.     Historical Provider, MD  omeprazole (PRILOSEC) 40 MG capsule TAKE 1 CAPSULE BY MOUTH ONCE DAILY FOR REFLUX. 12/15/16   Arnoldo Lenis, MD  spironolactone (ALDACTONE) 50 MG tablet TAKE 1 TABLET BY MOUTH ONCE DAILY. 12/15/16   Arnoldo Lenis, MD  ursodiol (ACTIGALL) 500 MG tablet TAKE 1 TABLET BY MOUTH TWICE DAILY 08/11/16   Carlis Stable, NP     Family History  Problem Relation Age of Onset   . COPD Mother   . Hypertension Mother   . Congestive Heart Failure Mother   . Pneumonia Father   . Heart disease Other   . Hypertension Other   . Colon cancer Neg Hx     Social History   Social History  . Marital status: Married    Spouse name: N/A  . Number of children: N/A  . Years of education: N/A   Social History Main Topics  . Smoking status: Never Smoker  . Smokeless tobacco: Never Used     Comment: tobacco use- no   . Alcohol use No     Comment: Hx. of alcohol use  . Drug use: No  . Sexual activity: Yes   Other Topics Concern  . Not on file   Social History Narrative   Part time.     ECOG Status: 2 - Symptomatic, <50% confined to bed  Review of Systems: A 12 point ROS discussed and pertinent positives are indicated in the HPI above.  All other systems are negative.  Review of Systems  Vital Signs: Ht 5\' 3"  (1.6 m)   Wt 138 lb 11.2 oz (62.9 kg)   BMI 24.57 kg/m   Physical Exam  Mallampati Score:     Imaging: Ct Abdomen Pelvis W Contrast  Result Date: 12/23/2016 CLINICAL DATA:  Acute generalized abdominal pain. Hepatic cirrhosis. EXAM: CT ABDOMEN AND PELVIS WITH CONTRAST TECHNIQUE: Multidetector CT imaging of the abdomen and pelvis was performed using the standard protocol following bolus administration of intravenous contrast. CONTRAST:  71mL ISOVUE-300 IOPAMIDOL (ISOVUE-300) INJECTION 61% COMPARISON:  CT scan of November 11, 2016. FINDINGS: Lower chest: No acute abnormality. Hepatobiliary: Hepatic cirrhosis is noted.  No gallstones are noted. Pancreas: Unremarkable. No pancreatic ductal dilatation or surrounding inflammatory changes. Spleen: Normal in size without focal abnormality. Adrenals/Urinary Tract: Adrenal glands are unremarkable. No hydronephrosis or renal obstruction is noted. Urinary bladder appears normal. Moderate right renal cortical scarring and atrophy is noted. Left kidney appears normal. Stomach/Bowel: Stomach is within normal limits.  Appendix appears normal. No evidence of bowel wall thickening, distention, or inflammatory changes. Vascular/Lymphatic: Aortic atherosclerosis. No enlarged abdominal or pelvic lymph nodes. Reproductive: Prostate is unremarkable. Other: No hernia is noted.  Moderate ascites is noted. Musculoskeletal: No acute or significant osseous findings. IMPRESSION: Hepatic cirrhosis. Moderate ascites. Aortic atherosclerosis. No other definite abnormality seen in the abdomen or pelvis. Electronically Signed   By: Sabino Dick  Brooke Bonito, M.D.   On: 12/23/2016 11:31   US Paracentesis  Result Date: 01/05/2017 INDICATION: 56 year old male with history of recurrent ascites. EXAM: ULTRASOUND GUIDED  PARACENTESIS MEDICATIONS: None. COMPLICATIONS: None immediate. PROCEDURE: Informed written consent was obtained from the patient after a discussion of the risks, benefits and alternatives to treatment. A timeout was performed prior to the initiation of the procedure. Initial ultrasound scanning demonstrates a large amount of ascites within the right lower abdominal quadrant. The right lower abdomen was prepped and draped in the usual sterile fashion. 1% lidocaine with epinephrine was used for local anesthesia. Following this, a 19 gauge, 7-cm, Yueh catheter was introduced. An ultrasound image was saved for documentation purposes. The paracentesis was performed. The catheter was removed and a dressing was applied. The patient tolerated the procedure well without immediate post procedural complication. FINDINGS: A total of approximately 6.4 L of serosanguineous fluid was removed. Samples were sent to the laboratory as requested by the clinical team. IMPRESSION: Successful ultrasound-guided paracentesis yielding 6.4 liters of peritoneal fluid. Electronically Signed   By: Vinnie Langton M.D.   On: 01/05/2017 12:17   US Paracentesis  Result Date: 12/27/2016 INDICATION: Cirrhosis, ascites EXAM: ULTRASOUND GUIDED THERAPEUTIC PARACENTESIS  MEDICATIONS: None. COMPLICATIONS: None immediate. PROCEDURE: Procedure, benefits, and risks of procedure were discussed with patient. Written informed consent for procedure was obtained. Time out protocol followed. Adequate collection of ascites localized by ultrasound in LEFT lower quadrant. Skin prepped and draped in usual sterile fashion. Skin and soft tissues anesthetized with 10 mL of 1% lidocaine. 5 [place] Yueh catheter placed into peritoneal cavity. 2.2 L of dark old bloody fluid aspirated by vacuum bottle suction. Procedure tolerated well by patient without immediate complication. FINDINGS: As above IMPRESSION: Successful ultrasound-guided paracentesis yielding 2.2 liters of peritoneal fluid. Electronically Signed   By: Lavonia Dana M.D.   On: 12/27/2016 14:18   US Paracentesis  Result Date: 12/22/2016 INDICATION: Ascites.  Cirrhosis. EXAM: ULTRASOUND GUIDED RIGHT PARACENTESIS MEDICATIONS: None. COMPLICATIONS: None immediate. PROCEDURE: Informed written consent was obtained from the patient after a discussion of the risks, benefits and alternatives to treatment. A timeout was performed prior to the initiation of the procedure. Initial ultrasound scanning demonstrates a large amount of ascites within the right lower abdominal quadrant. The right lower abdomen was prepped and draped in the usual sterile fashion. 1% lidocaine was used for local anesthesia. Following this, a Yuen catheter was introduced. An ultrasound image was saved for documentation purposes. The paracentesis was performed. The catheter was removed and a dressing was applied. The patient tolerated the procedure well without immediate post procedural complication. FINDINGS: A total of approximately 4.3 L of bloody ascitic fluid was removed. IMPRESSION: Successful ultrasound-guided paracentesis yielding 4.3 liters of peritoneal fluid. Electronically Signed   By: Lorriane Shire M.D.   On: 12/22/2016 12:57   US Paracentesis  Result Date:  12/09/2016 INDICATION: Cirrhosis, ascites EXAM: ULTRASOUND GUIDED THERAPEUTIC PARACENTESIS MEDICATIONS: None. COMPLICATIONS: None immediate. PROCEDURE: Procedure, benefits, and risks of procedure were discussed with patient. Written informed consent for procedure was obtained. Time out protocol followed. Adequate collection of ascites localized by ultrasound in RIGHT lower quadrant. Skin prepped and draped in usual sterile fashion. Skin and soft tissues anesthetized with 10 mL of 1% lidocaine. 5 [place] Yueh catheter placed into peritoneal cavity. 7.1 L of yellow ascitic fluid aspirated by vacuum bottle suction. Procedure tolerated well by patient without immediate complication. FINDINGS: As above IMPRESSION: Successful ultrasound-guided paracentesis yielding 7.1 liters of peritoneal fluid. Electronically Signed  By: Lavonia Dana M.D.   On: 12/09/2016 11:38   Ir Tips  Result Date: 12/14/2016 CLINICAL DATA:  History of alcoholic cirrhosis with recent recurrent symptomatic ascites. Request made for creation of a TIPS. Please refer to formal consultation in the epic EMR. EXAM: 1. ATTEMPTED TRANSJUGULAR INTRAHEPATIC PORTOSYSTEMIC SHUNT 2. IR PARACENTESIS COMPARISON:  CT abdomen and pelvis - 11/11/2016 MEDICATIONS: As antibiotic prophylaxis, Vancomycin 1 gm IV was ordered pre-procedure and administered intravenously within one hour of incision. ANESTHESIA/SEDATION: General - as administered by the Anesthesia department CONTRAST:  125 cc Isovue-300 FLUOROSCOPY TIME:  Fluoroscopy Time: 55 minutes 18 seconds (667 mGy). COMPLICATIONS: None immediate. PROCEDURE: Informed written consent was obtained from the patient and the patient's wife after a thorough discussion of the procedural risks, benefits and alternatives. All questions were addressed. Maximal Sterile Barrier Technique was utilized including caps, mask, sterile gowns, sterile gloves, sterile drape, hand hygiene and skin antiseptic. A timeout was performed prior  to the initiation of the procedure. Initial ultrasound scanning demonstrates a moderate amount of ascites within the right lower abdominal quadrant. The right lower abdomen was prepped and draped in the usual sterile fashion. 1% lidocaine with epinephrine was used for local anesthesia. An ultrasound image was saved for documentation purposed. An 8 Fr Safe-T-Centesis catheter was introduced. The paracentesis was performed as we proceeded with the TIPS creation. The skin overlying the right upper abdominal quadrant as well as the right neck were prepped and draped in usual sterile fashion. Under direct ultrasound guidance, a peripheral aspect of the right portal vein was accessed with a 22 gauge needle. Un ultrasound image was saved for procedural documentation purposes. The track was dilated with the inner 3 French catheter from the [place] set. Several contrast limited portal venograms were performed. A Nitrex wire was advanced through the 3 French catheter with the radiopaque transition positioned at the target entrance to the right portal vein. Next, the right internal jugular vein was accessed under direct ultrasound. Ultrasound image was saved for procedural documentation purposes. This allowed for placement of the 10 French TIPS vascular sheath. With the use of a stiff Glidewire, an MPA catheter was utilized to select the right hepatic vein and a hepatic venogram was performed. Under fluoroscopic guidance, the right portal vein with targeted with a TIPS needle directed at the radiopaque target within the right portal vein. Note, despite accessing a branch vessel of the right portal vein several times, a wire was unable to be advanced into the main portal vein. At this point, decision was made to abandoned attempted TIPS creation from the right portal vein. Prolonged efforts were made to cannulate the middle hepatic vein with an MPA, C2 and rim catheter, which was ultimately achieved transiently with the C2  catheter, however the TIPS sheath was unable to be advanced into the middle hepatic vein secondary to abrupt angulation of the takeoff of the middle vein near the confluence of the IVC and atrium. At this point, the procedure was aborted. All wires catheters and sheaths were removed from the patient. Hemostasis was achieved at the right neck and right upper abdominal access sites with manual compression. The paracentesis catheter was removed and superficial hemostasis was achieved with manual compression. Dressings were placed. The patient tolerated the procedure well without immediate postprocedural complication, was extubated and transported to the PACU for recovery. FINDINGS: Ultrasound scanning demonstrates recurrent moderate volume intra- abdominal ascites. Ultrasound-guided paracentesis was performed yielding 3.3 L of ascitic fluid. As demonstrated on prior contrast-enhanced  abdominal CT performed 11/11/2016, the patient has non conventional hepatic venous configuration with the right hepatic vein arising separately from the more caudal aspect of the IVC and a combined origin of both the left and middle hepatic veins from the anterior cranial aspect of the IVC, immediately below the cavoatrial junction. Initial attempts to create a TIPS from the right hepatic vein ultimately proved unsuccessful as a wire was unable to be advanced centrally from an accessed branch vessel of the right portal vein. The more central aspect of the right portal vein could not be accessed fluoroscopically secondary to abrupt angulation and proximity of attempted access site to the origin of the right hepatic vein. The middle hepatic vein was transiently catheterized, however despite prolonged efforts, the TIPS sheath could not be advanced into the middle hepatic vein secondary to abrupt angulation. IMPRESSION: 1. Attempted though ultimately unsuccessful creation of a TIPS. 2. Successful ultrasound-guided paracentesis yielding 3.3 L  of ascitic fluid. PLAN: - Unfortunately, given the shrunken cirrhotic liver and non conventional hepatic venous supply, the patient is not a candidate for TIPS placement. Electronically Signed   By: Sandi Mariscal M.D.   On: 12/14/2016 13:35   Dg Chest Port 1 View  Result Date: 12/24/2016 CLINICAL DATA:  Fever a.  Sepsis. EXAM: PORTABLE CHEST 1 VIEW COMPARISON:  11/13/2016 FINDINGS: The heart is mildly enlarged but stable. Mild tortuosity of the thoracic aorta. The pacer wires are stable. No acute pulmonary findings. The bony thorax is intact. IMPRESSION: Mild stable cardiac enlargement.  No acute pulmonary findings. Electronically Signed   By: Marijo Sanes M.D.   On: 12/24/2016 19:25   Ir Paracentesis  Result Date: 12/14/2016 CLINICAL DATA:  History of alcoholic cirrhosis with recent recurrent symptomatic ascites. Request made for creation of a TIPS. Please refer to formal consultation in the epic EMR. EXAM: 1. ATTEMPTED TRANSJUGULAR INTRAHEPATIC PORTOSYSTEMIC SHUNT 2. IR PARACENTESIS COMPARISON:  CT abdomen and pelvis - 11/11/2016 MEDICATIONS: As antibiotic prophylaxis, Vancomycin 1 gm IV was ordered pre-procedure and administered intravenously within one hour of incision. ANESTHESIA/SEDATION: General - as administered by the Anesthesia department CONTRAST:  125 cc Isovue-300 FLUOROSCOPY TIME:  Fluoroscopy Time: 55 minutes 18 seconds (667 mGy). COMPLICATIONS: None immediate. PROCEDURE: Informed written consent was obtained from the patient and the patient's wife after a thorough discussion of the procedural risks, benefits and alternatives. All questions were addressed. Maximal Sterile Barrier Technique was utilized including caps, mask, sterile gowns, sterile gloves, sterile drape, hand hygiene and skin antiseptic. A timeout was performed prior to the initiation of the procedure. Initial ultrasound scanning demonstrates a moderate amount of ascites within the right lower abdominal quadrant. The right lower  abdomen was prepped and draped in the usual sterile fashion. 1% lidocaine with epinephrine was used for local anesthesia. An ultrasound image was saved for documentation purposed. An 8 Fr Safe-T-Centesis catheter was introduced. The paracentesis was performed as we proceeded with the TIPS creation. The skin overlying the right upper abdominal quadrant as well as the right neck were prepped and draped in usual sterile fashion. Under direct ultrasound guidance, a peripheral aspect of the right portal vein was accessed with a 22 gauge needle. Un ultrasound image was saved for procedural documentation purposes. The track was dilated with the inner 3 French catheter from the [place] set. Several contrast limited portal venograms were performed. A Nitrex wire was advanced through the 3 French catheter with the radiopaque transition positioned at the target entrance to the right portal vein. Next,  the right internal jugular vein was accessed under direct ultrasound. Ultrasound image was saved for procedural documentation purposes. This allowed for placement of the 10 French TIPS vascular sheath. With the use of a stiff Glidewire, an MPA catheter was utilized to select the right hepatic vein and a hepatic venogram was performed. Under fluoroscopic guidance, the right portal vein with targeted with a TIPS needle directed at the radiopaque target within the right portal vein. Note, despite accessing a branch vessel of the right portal vein several times, a wire was unable to be advanced into the main portal vein. At this point, decision was made to abandoned attempted TIPS creation from the right portal vein. Prolonged efforts were made to cannulate the middle hepatic vein with an MPA, C2 and rim catheter, which was ultimately achieved transiently with the C2 catheter, however the TIPS sheath was unable to be advanced into the middle hepatic vein secondary to abrupt angulation of the takeoff of the middle vein near the  confluence of the IVC and atrium. At this point, the procedure was aborted. All wires catheters and sheaths were removed from the patient. Hemostasis was achieved at the right neck and right upper abdominal access sites with manual compression. The paracentesis catheter was removed and superficial hemostasis was achieved with manual compression. Dressings were placed. The patient tolerated the procedure well without immediate postprocedural complication, was extubated and transported to the PACU for recovery. FINDINGS: Ultrasound scanning demonstrates recurrent moderate volume intra- abdominal ascites. Ultrasound-guided paracentesis was performed yielding 3.3 L of ascitic fluid. As demonstrated on prior contrast-enhanced abdominal CT performed 11/11/2016, the patient has non conventional hepatic venous configuration with the right hepatic vein arising separately from the more caudal aspect of the IVC and a combined origin of both the left and middle hepatic veins from the anterior cranial aspect of the IVC, immediately below the cavoatrial junction. Initial attempts to create a TIPS from the right hepatic vein ultimately proved unsuccessful as a wire was unable to be advanced centrally from an accessed branch vessel of the right portal vein. The more central aspect of the right portal vein could not be accessed fluoroscopically secondary to abrupt angulation and proximity of attempted access site to the origin of the right hepatic vein. The middle hepatic vein was transiently catheterized, however despite prolonged efforts, the TIPS sheath could not be advanced into the middle hepatic vein secondary to abrupt angulation. IMPRESSION: 1. Attempted though ultimately unsuccessful creation of a TIPS. 2. Successful ultrasound-guided paracentesis yielding 3.3 L of ascitic fluid. PLAN: - Unfortunately, given the shrunken cirrhotic liver and non conventional hepatic venous supply, the patient is not a candidate for TIPS  placement. Electronically Signed   By: Sandi Mariscal M.D.   On: 12/14/2016 13:35    Labs:  CBC:  Recent Labs  12/10/16 1624 12/14/16 1058 12/23/16 0952 12/24/16 0601 12/25/16 0557  WBC 11.9*  --  15.7* 12.2* 10.1  HGB 15.7 12.9* 13.9 11.7* 10.4*  HCT 48.5 38.0* 42.8 36.9* 31.9*  PLT 461*  --  514* 343 299    COAGS:  Recent Labs  11/11/16 1536 11/30/16 1203 12/10/16 1624 12/14/16 0651  INR 1.16 1.1 1.09 1.10  APTT  --   --  35 35    BMP:  Recent Labs  12/23/16 0952 12/24/16 0601 12/25/16 0557 12/26/16 0610  NA 135 137 136 134*  K 5.4* 3.4* 3.4* 4.7  CL 98* 102 103 104  CO2 22 26 23 23   GLUCOSE 245* 98  83 117*  BUN 40* 29* 25* 21*  CALCIUM 9.3 8.6* 8.3* 8.4*  CREATININE 1.50* 1.13 0.97 0.91  GFRNONAA 50* >60 >60 >60  GFRAA 58* >60 >60 >60    LIVER FUNCTION TESTS:  Recent Labs  11/30/16 1205 12/10/16 1624 12/23/16 0952 12/24/16 0601  BILITOT 0.8 0.8 1.6* 1.0  AST 61* 64* 59* 44*  ALT 58* 61 54 41  ALKPHOS 535* 408* 406* 325*  PROT 5.3* 6.1* 6.5 5.9*  ALBUMIN 3.0* 2.9* 3.2* 3.0*    TUMOR MARKERS: No results for input(s): AFPTM, CEA, CA199, CHROMGRNA in the last 8760 hours.  Assessment and Plan:  [name] is a 56 y.o. male with complex medical history significant for congenital deafness, hypertension, hyperlipidemia, aortic stenosis, mitral regurgitation, atrial flutter, post pacemaker insertion, COPD (on 2 L via nasal cannula continuously), obstructive sleep apnea necessitating BiPAP diabetes, anemia, history of alcohol abuse and a combination of alcoholic and NASH cirrhosis who initially presented to the interventional radiology clinic for evaluation of potential TIPS creation on 11/30/2016.  Attempted TIPS creation was performed on 12/14/2016, however the procedure was unsuccessful secondary to abrupt / severe angulation of the right hepatic vein and right portal veins, as well as a tortuous origin involving the conjoined middle and left  hepatic veins at the level of the inferior cavoatrial junction.  Since undergoing the attempted TIPS creation, the patient has undergone 3 subsequent ultrasound-guided paracentesis, most recently on 01/05/2017, yielding 6.4 L.  I explained that given his variant anatomy, a repeat attempted TIPS creation would likely entail utilizing a gun site technique with an additional percutaneous access to connect the right portal and hepatic veins. I explained that this technique is more advanced with a slightly increased risk of bleeding however may have to be utilized given his variant anatomy.  Following this prolonged and detailed conversation, the patient and the patient's wife wish to pursue a repeat attempt at TIPS shunt creation.  As such, we will again schedule this procedure to be performed at [facility] with general anesthesia.  I will arrange for this procedure to be performed with my colleague, Dr. Laurence Ferrari, as he has familiarity with this advanced technique.  The procedure will again entail an overnight admission for continued observation.  The patient and the patient's wife were encouraged to call the interventional radiology clinic with any interval questions or concerns.   A copy of this report was sent to the requesting provider on this date.  Electronically Signed: Sandi Mariscal 01/06/2017, 4:55 PM   I spent a total of 15 Minutes in face to face in clinical consultation, greater than 50% of which was counseling/coordinating care for recurrent symptomatic ascites

## 2017-01-07 ENCOUNTER — Other Ambulatory Visit (HOSPITAL_COMMUNITY): Payer: Self-pay | Admitting: Interventional Radiology

## 2017-01-07 DIAGNOSIS — R188 Other ascites: Secondary | ICD-10-CM

## 2017-01-11 ENCOUNTER — Other Ambulatory Visit: Payer: Self-pay | Admitting: Radiology

## 2017-01-11 ENCOUNTER — Other Ambulatory Visit: Payer: Self-pay | Admitting: Student

## 2017-01-11 ENCOUNTER — Encounter (HOSPITAL_COMMUNITY)
Admission: RE | Admit: 2017-01-11 | Discharge: 2017-01-11 | Disposition: A | Discharge status: Home / Self Care | Payer: Medicare Other | Source: Ambulatory Visit | Attending: Interventional Radiology | Admitting: Interventional Radiology

## 2017-01-11 DIAGNOSIS — Z9981 Dependence on supplemental oxygen: Secondary | ICD-10-CM | POA: Diagnosis not present

## 2017-01-11 DIAGNOSIS — E119 Type 2 diabetes mellitus without complications: Secondary | ICD-10-CM | POA: Diagnosis not present

## 2017-01-11 DIAGNOSIS — Z950 Presence of cardiac pacemaker: Secondary | ICD-10-CM | POA: Diagnosis not present

## 2017-01-11 DIAGNOSIS — I110 Hypertensive heart disease with heart failure: Secondary | ICD-10-CM | POA: Diagnosis not present

## 2017-01-11 DIAGNOSIS — K219 Gastro-esophageal reflux disease without esophagitis: Secondary | ICD-10-CM | POA: Diagnosis not present

## 2017-01-11 DIAGNOSIS — E039 Hypothyroidism, unspecified: Secondary | ICD-10-CM | POA: Diagnosis not present

## 2017-01-11 DIAGNOSIS — E669 Obesity, unspecified: Secondary | ICD-10-CM | POA: Diagnosis not present

## 2017-01-11 DIAGNOSIS — N400 Enlarged prostate without lower urinary tract symptoms: Secondary | ICD-10-CM | POA: Diagnosis not present

## 2017-01-11 DIAGNOSIS — I350 Nonrheumatic aortic (valve) stenosis: Secondary | ICD-10-CM | POA: Diagnosis not present

## 2017-01-11 DIAGNOSIS — H905 Unspecified sensorineural hearing loss: Secondary | ICD-10-CM | POA: Diagnosis not present

## 2017-01-11 DIAGNOSIS — G4730 Sleep apnea, unspecified: Secondary | ICD-10-CM | POA: Diagnosis not present

## 2017-01-11 DIAGNOSIS — I509 Heart failure, unspecified: Secondary | ICD-10-CM | POA: Diagnosis not present

## 2017-01-11 DIAGNOSIS — E785 Hyperlipidemia, unspecified: Secondary | ICD-10-CM | POA: Diagnosis not present

## 2017-01-11 DIAGNOSIS — M1990 Unspecified osteoarthritis, unspecified site: Secondary | ICD-10-CM | POA: Diagnosis not present

## 2017-01-11 DIAGNOSIS — I4892 Unspecified atrial flutter: Secondary | ICD-10-CM | POA: Diagnosis not present

## 2017-01-11 DIAGNOSIS — J449 Chronic obstructive pulmonary disease, unspecified: Secondary | ICD-10-CM | POA: Diagnosis not present

## 2017-01-11 DIAGNOSIS — K7031 Alcoholic cirrhosis of liver with ascites: Secondary | ICD-10-CM | POA: Diagnosis present

## 2017-01-11 DIAGNOSIS — Z6824 Body mass index (BMI) 24.0-24.9, adult: Secondary | ICD-10-CM | POA: Diagnosis not present

## 2017-01-11 LAB — COMPREHENSIVE METABOLIC PANEL
ALBUMIN: 2.8 g/dL — AB (ref 3.5–5.0)
ALK PHOS: 409 U/L — AB (ref 38–126)
ALT: 50 U/L (ref 17–63)
ANION GAP: 11 (ref 5–15)
AST: 50 U/L — AB (ref 15–41)
BILIRUBIN TOTAL: 1.6 mg/dL — AB (ref 0.3–1.2)
BUN: 43 mg/dL — AB (ref 6–20)
CHLORIDE: 96 mmol/L — AB (ref 101–111)
CO2: 26 mmol/L (ref 22–32)
Calcium: 9.1 mg/dL (ref 8.9–10.3)
Creatinine, Ser: 1.3 mg/dL — ABNORMAL HIGH (ref 0.61–1.24)
GFR calc Af Amer: >60 mL/min (ref >60)
GFR calc non Af Amer: 60 mL/min — ABNORMAL LOW (ref >60)
GLUCOSE: 234 mg/dL — AB (ref 65–99)
POTASSIUM: 5.1 mmol/L (ref 3.5–5.1)
SODIUM: 133 mmol/L — AB (ref 135–145)
TOTAL PROTEIN: 6.2 g/dL — AB (ref 6.5–8.1)

## 2017-01-11 LAB — APTT: APTT: 33 s (ref 24–36)

## 2017-01-11 LAB — CBC
HEMATOCRIT: 41.4 % (ref 39.0–52.0)
HEMOGLOBIN: 12.8 g/dL — AB (ref 13.0–17.0)
MCH: 27.8 pg (ref 26.0–34.0)
MCHC: 30.9 g/dL (ref 30.0–36.0)
MCV: 90 fL (ref 78.0–100.0)
Platelets: 530 10*3/uL — ABNORMAL HIGH (ref 150–400)
RBC: 4.6 MIL/uL (ref 4.22–5.81)
RDW: 16.2 % — ABNORMAL HIGH (ref 11.5–15.5)
WBC: 14 10*3/uL — ABNORMAL HIGH (ref 4.0–10.5)

## 2017-01-11 LAB — GLUCOSE, CAPILLARY: Glucose-Capillary: 243 mg/dL — ABNORMAL HIGH (ref 65–99)

## 2017-01-11 LAB — PROTIME-INR
INR: 1.1
Prothrombin Time: 14.2 seconds (ref 11.4–15.2)

## 2017-01-11 NOTE — Anesthesia Preprocedure Evaluation (Addendum)
Anesthesia Evaluation  Patient identified by MRN, date of birth, ID band Patient awake and Patient confused    Reviewed: Allergy & Precautions, NPO status , Patient's Chart, lab work & pertinent test results  History of Anesthesia Complications (+) PONV  Airway Mallampati: II  TM Distance: >3 FB Neck ROM: Full    Dental  (+) Dental Advisory Given   Pulmonary shortness of breath and Long-Term Oxygen Therapy, sleep apnea and Continuous Positive Airway Pressure Ventilation , COPD,  COPD inhaler,    breath sounds clear to auscultation (-) decreased breath sounds      Cardiovascular hypertension, Pt. on medications and Pt. on home beta blockers +CHF  + dysrhythmias Atrial Fibrillation + pacemaker + Valvular Problems/Murmurs AS  Rhythm:Regular Rate:Normal + Systolic murmurs- Diastolic murmurs Left ventricle: The cavity size was normal. Wall thickness was  increased in a pattern of moderate LVH. Systolic function was normal. The estimated ejection fraction was in the range of 60% to 65%. Wall motion was normal; there were no regional wall motion abnormalities. Left ventricular diastolic function parameters were normal. - Aortic valve: Moderately calcified annulus. There was moderate to severe stenosis. There was mild regurgitation. Peak velocity (S):  329 cm/s. Mean gradient (S): 26 mm Hg. Valve area (VTI): 0.82cm^2. Valve area (Vmax): 0.85 cm^2. Valve area (Vmean): 0.8 cm^2. - Mitral valve: Mildly calcified leaflets . - Right ventricle: Pacer wire or catheter noted in right ventricle. - Right atrium: Pacer wire or catheter noted in right atrium. - Tricuspid valve: There was mild regurgitation.   Neuro/Psych Congenital deafness negative psych ROS   GI/Hepatic GERD  Medicated and Controlled,(+) Cirrhosis   ascites  substance abuse  alcohol use, Hepatitis -Elevated LFT's   Endo/Other  diabetes, Poorly Controlled, Type 2, Insulin  DependentHypothyroidism Hyperlipidemia  Renal/GU Renal diseaseHx/o renal calculi   BPH    Musculoskeletal  (+) Arthritis ,   Abdominal   Peds  Hematology  (+) anemia ,   Anesthesia Other Findings   Reproductive/Obstetrics                            Lab Results  Component Value Date   WBC 14.0 (H) 01/11/2017   HGB 12.8 (L) 01/11/2017   HCT 41.4 01/11/2017   MCV 90.0 01/11/2017   PLT 530 (H) 01/11/2017   Lab Results  Component Value Date   CREATININE 1.30 (H) 01/11/2017   BUN 43 (H) 01/11/2017   NA 133 (L) 01/11/2017   K 5.1 01/11/2017   CL 96 (L) 01/11/2017   CO2 26 01/11/2017   Lab Results  Component Value Date   INR 1.10 01/11/2017   INR 1.10 12/14/2016   INR 1.09 12/10/2016    Anesthesia Physical  Anesthesia Plan  ASA: IV  Anesthesia Plan: General   Post-op Pain Management:    Induction: Intravenous  Airway Management Planned: Oral ETT  Additional Equipment: Arterial line  Intra-op Plan:   Post-operative Plan: Extubation in OR and Possible Post-op intubation/ventilation  Informed Consent: I have reviewed the patients History and Physical, chart, labs and discussed the procedure including the risks, benefits and alternatives for the proposed anesthesia with the patient or authorized representative who has indicated his/her understanding and acceptance.   Dental advisory given  Plan Discussed with: CRNA, Anesthesiologist and Surgeon  Anesthesia Plan Comments:        Anesthesia Quick Evaluation

## 2017-01-11 NOTE — Pre-Procedure Instructions (Signed)
[name]  01/11/2017      [name], [place] - [address] [place] [address] Phone: [phone] Fax: [phone]    Your procedure is scheduled on April 25.  Report to [facility] Admitting at 7:30 A.M.  Call this number if you have problems the morning of surgery:  [phone]   Remember:  Do not eat food or drink liquids after midnight.  Take these medicines the morning of surgery with A SIP OF WATER : albuterol -bring with you, cetirizine (ZYRTEC), modafinil  (PROVIGIL), levothyroxine (LEVOXYL), omeprazole (PRILOSEC), ursodiol (ACTIGALL)   STOP aspirin, vitamins, herbal medications, fish oil, aleve advil,ibuprofen     How to Manage Your Diabetes Before and After Surgery  Why is it important to control my blood sugar before and after surgery? . Improving blood sugar levels before and after surgery helps healing and can limit problems. . A way of improving blood sugar control is eating a healthy diet by: o  Eating less sugar and carbohydrates o  Increasing activity/exercise o  Talking with your doctor about reaching your blood sugar goals . High blood sugars (greater than 180 mg/dL) can raise your risk of infections and slow your recovery, so you will need to focus on controlling your diabetes during the weeks before surgery. . Make sure that the doctor who takes care of your diabetes knows about your planned surgery including the date and location.  How do I manage my blood sugar before surgery? . Check your blood sugar at least 4 times a day, starting 2 days before surgery, to make sure that the level is not too high or low. o Check your blood sugar the morning of your surgery when you wake up and every 2 hours until you get to the Short Stay unit. . If your blood sugar is less than 70 mg/dL, you will need to treat for low blood sugar: o Do not take insulin. o Treat a low blood sugar (less than  70 mg/dL) with  cup of clear juice (cranberry or apple), 4 glucose tablets, OR glucose gel. o Recheck blood sugar in 15 minutes after treatment (to make sure it is greater than 70 mg/dL). If your blood sugar is not greater than 70 mg/dL on recheck, call [phone] for further instructions. . Report your blood sugar to the short stay nurse when you get to Short Stay.  . If you are admitted to the hospital after surgery: o Your blood sugar will be checked by the staff and you will probably be given insulin after surgery (instead of oral diabetes medicines) to make sure you have good blood sugar levels. o The goal for blood sugar control after surgery is 80-180 mg/dL.     WHAT DO I DO ABOUT MY DIABETES MEDICATION?   Marland Kitchen Do not take oral diabetes medicines (pills) the morning of surgery.  . THE NIGHT BEFORE SURGERY, take ___________ units of _humalog 75/25__insulin.      . The day of surgery, do not take other diabetes injectables, including Byetta (exenatide), Bydureon (exenatide ER), Victoza (liraglutide), or Trulicity (dulaglutide).  . If your CBG is greater than 220 mg/dL, you may take  of your sliding scale (correction) dose of insulin.  Other Instructions:          Patient Signature:  Date:   Nurse Signature:  Date:   Reviewed and Endorsed by [facility] Patient Education Committee, August 2015  Do not wear jewelry, make-up or nail polish.  Do not wear lotions, powders, or perfumes, or deoderant.  Do not shave 48 hours prior to surgery.  Men may shave face and neck.  Do not bring valuables to the hospital.  [facility] is not responsible for any belongings or valuables.  Contacts, dentures or bridgework may not be worn into surgery.  Leave your suitcase in the car.  After surgery it may be brought to your room.  For patients admitted to the hospital, discharge time will be determined by your treatment team.  Patients discharged the day of surgery will not be allowed  to drive home.   Name and phone number of your driver:    Special instructions:  Preparing for surgery  Please read over the following fact sheets that you were given. Pain Booklet and Surgical Site Infection Prevention

## 2017-01-12 ENCOUNTER — Ambulatory Visit (HOSPITAL_COMMUNITY)
Admission: RE | Admit: 2017-01-12 | Discharge: 2017-01-12 | Disposition: A | Discharge status: Home / Self Care | Payer: Medicare Other | Source: Ambulatory Visit | Attending: Interventional Radiology | Admitting: Interventional Radiology

## 2017-01-12 ENCOUNTER — Encounter (HOSPITAL_COMMUNITY)
Admission: RE | Disposition: A | Discharge status: Home / Self Care | Payer: Self-pay | Source: Ambulatory Visit | Attending: Interventional Radiology

## 2017-01-12 ENCOUNTER — Encounter (HOSPITAL_COMMUNITY): Payer: Self-pay

## 2017-01-12 ENCOUNTER — Inpatient Hospital Stay (HOSPITAL_COMMUNITY): Payer: Medicare Other | Admitting: Anesthesiology

## 2017-01-12 ENCOUNTER — Encounter (HOSPITAL_COMMUNITY): Payer: Self-pay | Admitting: Certified Registered Nurse Anesthetist

## 2017-01-12 ENCOUNTER — Observation Stay (HOSPITAL_COMMUNITY)
Admission: RE | Admit: 2017-01-12 | Discharge: 2017-01-13 | Disposition: A | Discharge status: Home / Self Care | Payer: Medicare Other | Source: Ambulatory Visit | Attending: Interventional Radiology | Admitting: Interventional Radiology

## 2017-01-12 ENCOUNTER — Other Ambulatory Visit (HOSPITAL_COMMUNITY): Payer: Medicare Other

## 2017-01-12 DIAGNOSIS — Z9981 Dependence on supplemental oxygen: Secondary | ICD-10-CM | POA: Insufficient documentation

## 2017-01-12 DIAGNOSIS — I350 Nonrheumatic aortic (valve) stenosis: Secondary | ICD-10-CM | POA: Insufficient documentation

## 2017-01-12 DIAGNOSIS — R188 Other ascites: Secondary | ICD-10-CM

## 2017-01-12 DIAGNOSIS — Z8249 Family history of ischemic heart disease and other diseases of the circulatory system: Secondary | ICD-10-CM | POA: Insufficient documentation

## 2017-01-12 DIAGNOSIS — Z6824 Body mass index (BMI) 24.0-24.9, adult: Secondary | ICD-10-CM | POA: Insufficient documentation

## 2017-01-12 DIAGNOSIS — K7031 Alcoholic cirrhosis of liver with ascites: Secondary | ICD-10-CM | POA: Diagnosis not present

## 2017-01-12 DIAGNOSIS — G4730 Sleep apnea, unspecified: Secondary | ICD-10-CM | POA: Insufficient documentation

## 2017-01-12 DIAGNOSIS — K7581 Nonalcoholic steatohepatitis (NASH): Secondary | ICD-10-CM

## 2017-01-12 DIAGNOSIS — I110 Hypertensive heart disease with heart failure: Secondary | ICD-10-CM | POA: Insufficient documentation

## 2017-01-12 DIAGNOSIS — K219 Gastro-esophageal reflux disease without esophagitis: Secondary | ICD-10-CM | POA: Insufficient documentation

## 2017-01-12 DIAGNOSIS — K7460 Unspecified cirrhosis of liver: Secondary | ICD-10-CM | POA: Diagnosis present

## 2017-01-12 DIAGNOSIS — E039 Hypothyroidism, unspecified: Secondary | ICD-10-CM | POA: Insufficient documentation

## 2017-01-12 DIAGNOSIS — H905 Unspecified sensorineural hearing loss: Secondary | ICD-10-CM | POA: Insufficient documentation

## 2017-01-12 DIAGNOSIS — E785 Hyperlipidemia, unspecified: Secondary | ICD-10-CM | POA: Insufficient documentation

## 2017-01-12 DIAGNOSIS — Z950 Presence of cardiac pacemaker: Secondary | ICD-10-CM | POA: Insufficient documentation

## 2017-01-12 DIAGNOSIS — Z880 Allergy status to penicillin: Secondary | ICD-10-CM | POA: Insufficient documentation

## 2017-01-12 DIAGNOSIS — M1990 Unspecified osteoarthritis, unspecified site: Secondary | ICD-10-CM | POA: Insufficient documentation

## 2017-01-12 DIAGNOSIS — I4892 Unspecified atrial flutter: Secondary | ICD-10-CM | POA: Insufficient documentation

## 2017-01-12 DIAGNOSIS — I509 Heart failure, unspecified: Secondary | ICD-10-CM | POA: Insufficient documentation

## 2017-01-12 DIAGNOSIS — E119 Type 2 diabetes mellitus without complications: Secondary | ICD-10-CM | POA: Insufficient documentation

## 2017-01-12 DIAGNOSIS — J449 Chronic obstructive pulmonary disease, unspecified: Secondary | ICD-10-CM | POA: Insufficient documentation

## 2017-01-12 DIAGNOSIS — E669 Obesity, unspecified: Secondary | ICD-10-CM | POA: Insufficient documentation

## 2017-01-12 DIAGNOSIS — N400 Enlarged prostate without lower urinary tract symptoms: Secondary | ICD-10-CM | POA: Insufficient documentation

## 2017-01-12 HISTORY — PX: RADIOLOGY WITH ANESTHESIA: SHX6223

## 2017-01-12 HISTORY — PX: IR TIPS: IMG2295

## 2017-01-12 HISTORY — PX: IR PARACENTESIS: IMG2679

## 2017-01-12 LAB — TYPE AND SCREEN
ABO/RH(D): O POS
ANTIBODY SCREEN: NEGATIVE

## 2017-01-12 LAB — GLUCOSE, CAPILLARY
GLUCOSE-CAPILLARY: 167 mg/dL — AB (ref 65–99)
Glucose-Capillary: 195 mg/dL — ABNORMAL HIGH (ref 65–99)
Glucose-Capillary: 183 mg/dL — ABNORMAL HIGH (ref 65–99)
Glucose-Capillary: 179 mg/dL — ABNORMAL HIGH (ref 65–99)

## 2017-01-12 SURGERY — RADIOLOGY WITH ANESTHESIA
Anesthesia: General

## 2017-01-12 MED ORDER — SODIUM CHLORIDE 0.9 % IV SOLN
INTRAVENOUS | Status: DC
  Administered 2017-01-12 (×2): via INTRAVENOUS

## 2017-01-12 MED ORDER — LEVOTHYROXINE SODIUM 50 MCG PO TABS
50.0000 ug | ORAL_TABLET | Freq: Every day | ORAL | Status: DC
  Administered 2017-01-13: 50 ug via ORAL
  Filled 2017-01-12: qty 1

## 2017-01-12 MED ORDER — MIDAZOLAM HCL 5 MG/5ML IJ SOLN
INTRAMUSCULAR | Status: DC | PRN
  Administered 2017-01-12: 1 mg via INTRAVENOUS

## 2017-01-12 MED ORDER — GELATIN ABSORBABLE 12-7 MM EX MISC
CUTANEOUS | Status: AC
  Filled 2017-01-12: qty 1

## 2017-01-12 MED ORDER — SPIRONOLACTONE 50 MG PO TABS
50.0000 mg | ORAL_TABLET | Freq: Every day | ORAL | Status: DC
  Administered 2017-01-12 – 2017-01-13 (×2): 50 mg via ORAL
  Filled 2017-01-12 (×2): qty 1

## 2017-01-12 MED ORDER — FUROSEMIDE 20 MG PO TABS
20.0000 mg | ORAL_TABLET | Freq: Every day | ORAL | Status: DC
  Administered 2017-01-12 – 2017-01-13 (×2): 20 mg via ORAL
  Filled 2017-01-12 (×2): qty 1

## 2017-01-12 MED ORDER — LACTULOSE 10 GM/15ML PO SOLN
6.7000 g | Freq: Two times a day (BID) | ORAL | Status: DC
  Administered 2017-01-12 – 2017-01-13 (×2): 6.7 g via ORAL
  Filled 2017-01-12 (×2): qty 15

## 2017-01-12 MED ORDER — SUGAMMADEX SODIUM 200 MG/2ML IV SOLN
INTRAVENOUS | Status: DC | PRN
  Administered 2017-01-12: 300 mg via INTRAVENOUS

## 2017-01-12 MED ORDER — IOPAMIDOL (ISOVUE-300) INJECTION 61%
INTRAVENOUS | Status: AC
  Filled 2017-01-12: qty 150

## 2017-01-12 MED ORDER — ALBUTEROL SULFATE HFA 108 (90 BASE) MCG/ACT IN AERS: 2.0000 | INHALATION_SPRAY | Freq: Four times a day (QID) | RESPIRATORY_TRACT | Status: DC | PRN

## 2017-01-12 MED ORDER — NALOXONE HCL 0.4 MG/ML IJ SOLN: 0.4000 mg | INTRAMUSCULAR | Status: DC | PRN

## 2017-01-12 MED ORDER — ONDANSETRON HCL 4 MG/2ML IJ SOLN
INTRAMUSCULAR | Status: DC | PRN
  Administered 2017-01-12 (×2): 4 mg via INTRAVENOUS

## 2017-01-12 MED ORDER — INSULIN ASPART PROT & ASPART (70-30 MIX) 100 UNIT/ML ~~LOC~~ SUSP
0.0000 [IU] | Freq: Every day | SUBCUTANEOUS | Status: DC
Start: 2017-01-12 — End: 2017-01-13
  Administered 2017-01-12: 6 [IU] via SUBCUTANEOUS
  Filled 2017-01-12: qty 10

## 2017-01-12 MED ORDER — PROMETHAZINE HCL 25 MG PO TABS: 25.0000 mg | ORAL_TABLET | Freq: Three times a day (TID) | ORAL | Status: DC | PRN

## 2017-01-12 MED ORDER — INSULIN LISPRO PROT & LISPRO (75-25 MIX) 100 UNIT/ML KWIKPEN: 0.0000 [IU] | PEN_INJECTOR | Freq: Two times a day (BID) | SUBCUTANEOUS | Status: DC

## 2017-01-12 MED ORDER — ONDANSETRON HCL 4 MG/2ML IJ SOLN
4.0000 mg | Freq: Four times a day (QID) | INTRAMUSCULAR | Status: DC | PRN
  Administered 2017-01-12: 4 mg via INTRAVENOUS
  Filled 2017-01-12: qty 2

## 2017-01-12 MED ORDER — ALBUTEROL SULFATE (2.5 MG/3ML) 0.083% IN NEBU: 2.5000 mg | INHALATION_SOLUTION | Freq: Four times a day (QID) | RESPIRATORY_TRACT | Status: DC | PRN

## 2017-01-12 MED ORDER — ROCURONIUM BROMIDE 100 MG/10ML IV SOLN
INTRAVENOUS | Status: DC | PRN
  Administered 2017-01-12: 10 mg via INTRAVENOUS
  Administered 2017-01-12: 40 mg via INTRAVENOUS
  Administered 2017-01-12: 20 mg via INTRAVENOUS

## 2017-01-12 MED ORDER — PHENYLEPHRINE HCL 10 MG/ML IJ SOLN
INTRAMUSCULAR | Status: DC | PRN
  Administered 2017-01-12: 30 ug/min via INTRAVENOUS

## 2017-01-12 MED ORDER — FENTANYL CITRATE (PF) 100 MCG/2ML IJ SOLN: 25.0000 ug | INTRAMUSCULAR | Status: DC | PRN

## 2017-01-12 MED ORDER — ALBUTEROL SULFATE HFA 108 (90 BASE) MCG/ACT IN AERS
INHALATION_SPRAY | RESPIRATORY_TRACT | Status: DC | PRN
  Administered 2017-01-12: 2 via RESPIRATORY_TRACT

## 2017-01-12 MED ORDER — SODIUM CHLORIDE 0.9 % IV SOLN
INTRAVENOUS | Status: DC | PRN
  Administered 2017-01-12 (×3): via INTRAVENOUS

## 2017-01-12 MED ORDER — DIPHENHYDRAMINE HCL 50 MG/ML IJ SOLN: 12.5000 mg | Freq: Four times a day (QID) | INTRAMUSCULAR | Status: DC | PRN

## 2017-01-12 MED ORDER — SODIUM CHLORIDE 0.9 % IV SOLN: 250.0000 mL | INTRAVENOUS | Status: DC | PRN

## 2017-01-12 MED ORDER — PROMETHAZINE HCL 25 MG RE SUPP: 25.0000 mg | Freq: Three times a day (TID) | RECTAL | Status: DC | PRN

## 2017-01-12 MED ORDER — SODIUM CHLORIDE 0.9% FLUSH
3.0000 mL | Freq: Two times a day (BID) | INTRAVENOUS | Status: DC
  Administered 2017-01-12: 3 mL via INTRAVENOUS

## 2017-01-12 MED ORDER — ETOMIDATE 2 MG/ML IV SOLN
INTRAVENOUS | Status: DC | PRN
  Administered 2017-01-12: 14 mg via INTRAVENOUS

## 2017-01-12 MED ORDER — PHENYLEPHRINE HCL 10 MG/ML IJ SOLN
INTRAMUSCULAR | Status: DC | PRN
  Administered 2017-01-12 (×2): 80 ug via INTRAVENOUS
  Administered 2017-01-12: 40 ug via INTRAVENOUS
  Administered 2017-01-12 (×2): 80 ug via INTRAVENOUS
  Administered 2017-01-12 (×3): 40 ug via INTRAVENOUS

## 2017-01-12 MED ORDER — INSULIN ASPART PROT & ASPART (70-30 MIX) 100 UNIT/ML ~~LOC~~ SUSP
8.0000 [IU] | Freq: Every day | SUBCUTANEOUS | Status: DC
  Filled 2017-01-12: qty 10

## 2017-01-12 MED ORDER — SODIUM CHLORIDE 0.9% FLUSH: 3.0000 mL | INTRAVENOUS | Status: DC | PRN

## 2017-01-12 MED ORDER — SODIUM CHLORIDE 0.9% FLUSH
9.0000 mL | INTRAVENOUS | Status: DC | PRN
Start: 2017-01-12 — End: 2017-01-12

## 2017-01-12 MED ORDER — ONDANSETRON HCL 4 MG/2ML IJ SOLN: 4.0000 mg | Freq: Four times a day (QID) | INTRAMUSCULAR | Status: DC | PRN

## 2017-01-12 MED ORDER — FENTANYL 40 MCG/ML IV SOLN: INTRAVENOUS | Status: DC

## 2017-01-12 MED ORDER — IOPAMIDOL (ISOVUE-300) INJECTION 61%
INTRAVENOUS | Status: AC
  Administered 2017-01-12: 50 mL
  Filled 2017-01-12: qty 150

## 2017-01-12 MED ORDER — PANTOPRAZOLE SODIUM 40 MG PO TBEC
40.0000 mg | DELAYED_RELEASE_TABLET | Freq: Every day | ORAL | Status: DC
  Administered 2017-01-13: 40 mg via ORAL
  Filled 2017-01-12: qty 1

## 2017-01-12 MED ORDER — DIPHENHYDRAMINE HCL 12.5 MG/5ML PO ELIX
12.5000 mg | ORAL_SOLUTION | Freq: Four times a day (QID) | ORAL | Status: DC | PRN
Start: 2017-01-12 — End: 2017-01-12

## 2017-01-12 MED ORDER — URSODIOL 300 MG PO CAPS
300.0000 mg | ORAL_CAPSULE | Freq: Two times a day (BID) | ORAL | Status: DC
  Administered 2017-01-12 – 2017-01-13 (×2): 300 mg via ORAL
  Filled 2017-01-12 (×2): qty 1

## 2017-01-12 MED ORDER — VANCOMYCIN HCL IN DEXTROSE 1-5 GM/200ML-% IV SOLN
1000.0000 mg | INTRAVENOUS | Status: AC
  Administered 2017-01-12: 1000 mg via INTRAVENOUS

## 2017-01-12 MED ORDER — URSODIOL 500 MG PO TABS: 500.0000 mg | ORAL_TABLET | Freq: Two times a day (BID) | ORAL | Status: DC

## 2017-01-12 MED ORDER — LIDOCAINE HCL (CARDIAC) 20 MG/ML IV SOLN
INTRAVENOUS | Status: DC | PRN
  Administered 2017-01-12: 30 mg via INTRAVENOUS

## 2017-01-12 MED ORDER — FENTANYL CITRATE (PF) 100 MCG/2ML IJ SOLN
INTRAMUSCULAR | Status: DC | PRN
  Administered 2017-01-12: 50 ug via INTRAVENOUS
  Administered 2017-01-12: 25 ug via INTRAVENOUS
  Administered 2017-01-12 (×3): 50 ug via INTRAVENOUS
  Administered 2017-01-12: 25 ug via INTRAVENOUS

## 2017-01-12 NOTE — Anesthesia Procedure Notes (Signed)
Procedure Name: Intubation Performed by: Ollen Bowl Pre-anesthesia Checklist: Patient identified, Emergency Drugs available, Suction available and Patient being monitored Patient Re-evaluated:Patient Re-evaluated prior to inductionOxygen Delivery Method: Circle system utilized Preoxygenation: Pre-oxygenation with 100% oxygen Intubation Type: IV induction Ventilation: Mask ventilation without difficulty Laryngoscope Size: Miller and 2 Grade View: Grade I Tube type: Oral Tube size: 7.5 mm Number of attempts: 1 Airway Equipment and Method: Stylet Placement Confirmation: ETT inserted through vocal cords under direct vision,  positive ETCO2,  CO2 detector and breath sounds checked- equal and bilateral Secured at: 22 cm Tube secured with: Tape Dental Injury: Teeth and Oropharynx as per pre-operative assessment

## 2017-01-12 NOTE — H&P (Signed)
Chief Complaint: cirrhosis of the liver  Referring Physician: Dr. Manus Rudd  Supervising Physician: Sandi Mariscal  Patient Status: [facility] - Out-pt  HPI: [name] is a 56 y.o. male with a complex PMH who has congenital deafness along with AS, mitral regurg, a flutter, who has a pacemaker.  He also has COPD and wears O2 at home.  He has a history of cirrhosis secondary to ETOH use as well as a component of NASH.  He underwent an attempt at a TIPS on 12/14/16 but was unsuccessful.  He followed up with Dr. Pascal Lux and is rescheduled for today for a repeat attempt with the assistance of Dr. Laurence Ferrari.  Of note, the patient was admitted to [facility] 3 weeks ago with a fever and elevated WBC of unknown etiology.  He was treated prophylactically with levaquin.  His WBC returned to normal and no further fevers.  His outpatient labs yesterday reveal a WBC of 14K and a Cr of 1.3 which is up from 0.9 2 weeks ago.  He does take diuretics at home and the wife states is fluid restricted.  He has no complaints today with no recent fevers.  He denies CP, SOB (any more so than usual), abdominal pain, extremity pain, fevers, chills, dysuria, etc.    Past Medical History:  Past Medical History:  Diagnosis Date  . Alcohol use   . Anemia   . Aortic stenosis    mild  . Arthritis   . Atrial flutter ([place])   . Benign hypertrophy of prostate    with urinary retention; repaired hypospadia; transurethral resection of the prostate   . Cardiac conduction disorder    status post pacemaker implantation in 1995 generator replaced 2005  . Chest pain 2007, 2009   cardiac cath > normal coronary arterie; nl left ventricular function  . CHF (congestive heart failure) ([place])   . Cirrhosis ([place]) 11/2016  . Congenital deafness   . COPD (chronic obstructive pulmonary disease) ([place])   . Diabetes mellitus    +Insulin  . Dyspnea   . Edema   . GERD (gastroesophageal reflux disease)    status post multiple dilatations for  stricture  . 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 Hepatitis    Thinks it was B  . History of kidney stones   . HTN (hypertension)   . Hyperlipidemia    statin discontinued due to abn LFTs  . Hypothyroidism   . Mitral regurgitation    insignificant  . Obesity   . Obesity 4/08   BMI= 40  . Pelvic mass    identified in 2009, stable 2010  . PONV (postoperative nausea and vomiting)   . Presence of permanent cardiac pacemaker   . Sleep apnea    BiPAP and continuous oxygen  . Syncope   . Thyroid disease     Past Surgical History:  Past Surgical History:  Procedure Laterality Date  . A-V CARDIAC PACEMAKER INSERTION  1995  . COLONOSCOPY  2008  . COLONOSCOPY N/A 06/23/2015   IRW:ERXVQMG diverticulosis  . ESOPHAGOGASTRODUODENOSCOPY N/A 06/23/2015   QQP:YPPJ portal gastropathy  . IR GENERIC HISTORICAL  12/14/2016   IR PARACENTESIS 12/14/2016 Sandi Mariscal, MD [facility]-INTERV RAD  . IR GENERIC HISTORICAL  12/14/2016   IR TIPS 12/14/2016 Sandi Mariscal, MD [facility]INTER[facility]  . NASAL SINUS SURGERY     partially removed  . ORCHIECTOMY  1990   bilateral; ?neoplasm  .  PACEMAKER INSERTION  05/2004; 10/04/2013   MDT dual chamber pacemaker; gen change 10/04/2013 (MDT ADDRL1)  . PERMANENT PACEMAKER GENERATOR CHANGE N/A 10/04/2013   Procedure: PERMANENT PACEMAKER GENERATOR CHANGE;  Surgeon: Evans Lance, MD;  Location: [facility] CATH LAB;  Service: Cardiovascular;  Laterality: N/A;  . RADIOLOGY WITH ANESTHESIA N/A 12/14/2016   Procedure: TIPS;  Surgeon: Sandi Mariscal, MD;  Location: [place];  Service: Radiology;  Laterality: N/A;  . REPAIR HYPOSPADIAS W/ URETHROPLASTY    . TONSILLECTOMY AND ADENOIDECTOMY    . TRANSURETHRAL RESECTION OF PROSTATE     for BPH    Family History:  Family History  Problem Relation Age of Onset  . COPD Mother   . Hypertension Mother   . Congestive Heart Failure Mother   . Pneumonia Father   . Heart disease Other   .  Hypertension Other   . Colon cancer Neg Hx     Social History:  reports that he has never smoked. He has never used smokeless tobacco. He reports that he does not drink alcohol or use drugs.  Allergies:  Allergies  Allergen Reactions  . Penicillins Swelling    SWELLING OF AIRWAY  PATIENT HAD A PCN REACTION WITH IMMEDIATE RASH, FACIAL/TONGUE/THROAT SWELLING, SOB, OR LIGHTHEADEDNESS WITH HYPOTENSION:  #  #  #  YES  #  #  #  Has patient had a PCN reaction causing severe rash involving mucus membranes or skin necrosis: No Has patient had a PCN reaction that required hospitalization No Has patient had a PCN reaction occurring within the last 10 years: No  . Enalapril Cough  . Metformin And Related Diarrhea  . Eliquis [Apixaban] Other (See Comments)    DOSE RELATED BLEEDING    Medications: Medications reviewed in epic  Please HPI for pertinent positives, otherwise complete 10 system ROS negative.  Mallampati Score: MD Evaluation Airway: WNL Heart: Other (comments) Heart  comments: has pacemaker Abdomen: WNL Chest/ Lungs: WNL ASA  Classification: 3 Mallampati/Airway Score: One  Physical Exam:   Temp (F)     97.7 97.8  97.8 (36.6)  04/25 0757  Pulse Rate     108 86  86  04/25 0757  Resp     18 18  18   04/25 0757  BP     114/78 111/89  111/89  04/25 0757  SpO2 (%)     94 94  94  04/25 0757  Weight (lb)  138   138 lb 11.2 oz (62.9 kg)  04    General: pleasant, WD, WN white male who is laying in bed in NAD HEENT: head is normocephalic, atraumatic.  Sclera are noninjected.  Somewhat disconjugate gaze.  Ears and nose without any masses or lesions.  Mouth is pink and moist Heart: regular, rate, and rhythm.  Pacemaker in place in left upper chest.  Normal s1,s2. No obvious murmurs, gallops, or rubs noted.  Palpable radial and pedal pulses bilaterally Lungs: CTAB, no wheezes, rhonchi, or rales noted.  Respiratory effort nonlabored Abd: soft, NT, ND, +BS, no masses, hernias,  or organomegaly MS: all 4 extremities are symmetrical with no cyanosis, clubbing, or edema, but with chronic venous stasis changes. Psych: A&Ox3 with an appropriate affect.   Labs: Results for orders placed or performed during the hospital encounter of 01/12/17 (from the past 48 hour(s))  Type and screen [place]     Status: None (Preliminary result)   Collection Time: 01/12/17  7:45 AM  Result Value Ref Range  ABO/RH(D) O POS    Antibody Screen PENDING    Sample Expiration 01/15/2017   Glucose, capillary     Status: Abnormal   Collection Time: 01/12/17  7:54 AM  Result Value Ref Range   Glucose-Capillary 195 (H) 65 - 99 mg/dL    Imaging: No results found.  Assessment/Plan 1. Cirrhosis of the liver The plan today is for a repeat attempt at a TIPS procedure for this patient.  I will discuss his lab findings of a WBC of 14K and a creatinine of 1.3 with Dr. Pascal Lux to make sure he is comfortable proceeding.  I have discussed the concern of his creatinine being elevated with the patient and his wife given our use of contrast for the procedure.  They understand but hope to be able to proceed.  He has no evidence of an infectious source to explain his WBC of 14K.  He was recently treated for an elevated WBC of unknown etiology.   Labs and vitals have been reviewed. The procedure including risks and complications has been discussed with the patient and his wife by Dr. Pascal Lux.  They understand and are agreeable to proceed.  His consent is signed and in the chart.  Thank you for this interesting consult.  I greatly enjoyed meeting [name] and look forward to participating in their care.  A copy of this report was sent to the requesting provider on this date.  Electronically Signed: Henreitta Cea 01/12/2017, 8:17 AM   I spent a total of    25 Minutes in face to face in clinical consultation, greater than 50% of which was counseling/coordinating care for  cirrhosis

## 2017-01-12 NOTE — Progress Notes (Signed)
Pt admitted from pacu s/p TIPS procedure. Still very sleepy but responsive to voice. Wife at bedside and provided assessment answers. Pt's wife says that he uses bipap at home,MD paged and verbal order given for bipap

## 2017-01-12 NOTE — Anesthesia Procedure Notes (Signed)
Arterial Line Insertion Start/End4/25/2018 9:20 AM, 01/12/2017 9:27 AM Performed by: Suzette Battiest, anesthesiologist  Patient location: Pre-op. Preanesthetic checklist: patient identified, IV checked, site marked, risks and benefits discussed, surgical consent, monitors and equipment checked, pre-op evaluation, timeout performed and anesthesia consent Lidocaine 1% used for infiltration Left, brachial was placed Catheter size: 20 Fr Hand hygiene performed  and maximum sterile barriers used   Attempts: 1 Procedure performed using ultrasound guided technique. Ultrasound Notes:anatomy identified, needle tip was noted to be adjacent to the nerve/plexus identified, no ultrasound evidence of intravascular and/or intraneural injection and image(s) printed for medical record Following insertion, dressing applied and line sutured. Post procedure assessment: normal and unchanged

## 2017-01-12 NOTE — Progress Notes (Signed)
Bipap ordered by MD with home settings as specified in order

## 2017-01-12 NOTE — Procedures (Signed)
Pre procedural Dx: Recurrent Ascites  Post procedural Dx: Same  Technically successful creation of a TIPS utilizing a gun-sight technique.   EBL: Minimal  Complications: None immediate.   Ronny Bacon, MD Pager #: [phone]

## 2017-01-12 NOTE — Transfer of Care (Signed)
Immediate Anesthesia Transfer of Care Note  Patient: [name]  Procedure(s) Performed: Procedure(s): TIPS (N/A)  Patient Location: PACU  Anesthesia Type:General  Level of Consciousness: awake and drowsy  Airway & Oxygen Therapy: Patient Spontanous Breathing and Patient connected to face mask oxygen  Post-op Assessment: Report given to RN and Post -op Vital signs reviewed and stable  Post vital signs: Reviewed and stable  Last Vitals:  Vitals:   01/12/17 1445 01/12/17 1450  BP: 103/74   Pulse: (!) 101   Resp:    Temp:  36.4 C    Last Pain:  Vitals:   01/12/17 1450  TempSrc:   PainSc: 0-No pain         Complications: No apparent anesthesia complications

## 2017-01-12 NOTE — Sedation Documentation (Signed)
Anesthesia case 

## 2017-01-13 ENCOUNTER — Encounter (HOSPITAL_COMMUNITY): Payer: Self-pay | Admitting: Interventional Radiology

## 2017-01-13 DIAGNOSIS — K7031 Alcoholic cirrhosis of liver with ascites: Secondary | ICD-10-CM | POA: Diagnosis not present

## 2017-01-13 LAB — GLUCOSE, CAPILLARY: GLUCOSE-CAPILLARY: 101 mg/dL — AB (ref 65–99)

## 2017-01-13 NOTE — Anesthesia Postprocedure Evaluation (Signed)
Anesthesia Post Note  Patient: [name]  Procedure(s) Performed: Procedure(s) (LRB): TIPS (N/A)  Patient location during evaluation: PACU Anesthesia Type: General Level of consciousness: awake and alert Pain management: pain level controlled Vital Signs Assessment: post-procedure vital signs reviewed and stable Respiratory status: spontaneous breathing, nonlabored ventilation, respiratory function stable and patient connected to nasal cannula oxygen Cardiovascular status: blood pressure returned to baseline and stable Postop Assessment: no signs of nausea or vomiting Anesthetic complications: no       Last Vitals:  Vitals:   01/13/17 0305 01/13/17 0611  BP: 99/67 102/75  Pulse: (!) 105 98  Resp: 17 17  Temp: 36.4 C 36.8 C    Last Pain:  Vitals:   01/13/17 0611  TempSrc: Oral  PainSc:                  [name]

## 2017-01-13 NOTE — Discharge Summary (Signed)
Patient ID: [name] MRN: [id number] DOB/AGE: 05-01-1961 56 y.o.  Admit date: 01/12/2017 Discharge date: 01/13/2017  Supervising Physician: Dr Sandi Mariscal  Patient Status: [facility] - In-pt  Admission Diagnoses: Recurrent symptomatic ascites                                         Cirrhosis  Discharge Diagnoses:  Active Problems:   Cirrhosis ([place])   Discharged Condition: stable; improved  Hospital Course: Cirrhosis; recurrent symptomatic ascites. Attempted Transjugular intrahepatic portal system shunt 12/14/16- unsuccessful. Reattempted 4/25 successfully. Admitted overnight for observation. Pt has done very well overnight. Eating and drinking well; denies pain; denies fever; denies Nausea/vomiting Slept well overnight. UOP is clear yellow. Ambulating in room without assistance. I have seen and examined the pt. Discussed status with Dr Pascal Lux. Plan for discharge to home.  Consults: None  Significant Diagnostic Studies:  1. TRANSJUGULAR INTRAHEPATIC PORTOSYSTEMIC SHUNT (GUN SIGHT TECHNIQUE) 2. IR PARACENTESIS  Treatments: Technically successful creation of a TIPS utilizing a gun-sight technique.                        Paracentesis: 4.4 Liters  Discharge Exam: Blood pressure 102/75, pulse 98, temperature 98.2 F (36.8 C), temperature source Oral, resp. rate 17, SpO2 93 %.  PE:  In NAD Feeling better today Alert/oriented; answers all questions appropriately Appropriate demeanor Rt IJ Neck site is clean and dry; NT; no bleeding or hematoma Hearing impaired Heart: ++ M; reg rate Lungs: CTA Abd: soft; no distension NT; no masses Site of TIPS creation is clean and dry NT no bleeding Extr: FROM; ambulating  UOP: 1.1L yesterday; over 200 cc today; yellow clear  Results for orders placed or performed during the hospital encounter of 01/12/17  Glucose, capillary  Result Value Ref Range   Glucose-Capillary 195 (H) 65 - 99 mg/dL  Glucose, capillary  Result  Value Ref Range   Glucose-Capillary 183 (H) 65 - 99 mg/dL  Glucose, capillary  Result Value Ref Range   Glucose-Capillary 179 (H) 65 - 99 mg/dL  Type and screen [place]  Result Value Ref Range   ABO/RH(D) O POS    Antibody Screen NEG    Sample Expiration 01/15/2017      Disposition: TIPS performed yesterday in IR with Dr Pascal Lux Pt has tolerated well. Overnight stay uneventful. Plan for discharge to home today. Continue all medications Follow up with Dr Pascal Lux 4 weeks for evaluation and TIPS [name] and labs Pt will hear from scheduler for appt time and date   Discharge Instructions    Call MD for:  difficulty breathing, headache or visual disturbances    Complete by:  As directed    Call MD for:  extreme fatigue    Complete by:  As directed    Call MD for:  hives    Complete by:  As directed    Call MD for:  persistant dizziness or light-headedness    Complete by:  As directed    Call MD for:  persistant nausea and vomiting    Complete by:  As directed    Call MD for:  redness, tenderness, or signs of infection (pain, swelling, redness, odor or green/yellow discharge around incision site)    Complete by:  As directed    Call MD for:  severe uncontrolled pain    Complete by:  As directed    Call MD for:  temperature >100.4    Complete by:  As directed    Diet - low sodium heart healthy    Complete by:  As directed    Discharge instructions    Complete by:  As directed    Resume all meds;  Wife to schedule for paracentesis as needed; call PMD for home health needs; may shower tomorrow   Discharge wound care:    Complete by:  As directed    May remove bandages at sites tomorrow; can keep new band aids on sites for 3-5 days   Driving Restrictions    Complete by:  As directed    No driving x 1 week   Increase activity slowly    Complete by:  As directed    Lifting restrictions    Complete by:  As directed    No lifting over 10 lbs x 1 week      Allergies as of 01/13/2017      Reactions   Penicillins Swelling   SWELLING OF AIRWAY PATIENT HAD A PCN REACTION WITH IMMEDIATE RASH, FACIAL/TONGUE/THROAT SWELLING, SOB, OR LIGHTHEADEDNESS WITH HYPOTENSION:  #  #  #  YES  #  #  #  Has patient had a PCN reaction causing severe rash involving mucus membranes or skin necrosis: No Has patient had a PCN reaction that required hospitalization No Has patient had a PCN reaction occurring within the last 10 years: No   Enalapril Cough   Metformin And Related Diarrhea   Eliquis [apixaban] Other (See Comments)   DOSE RELATED BLEEDING      Medication List    TAKE these medications   acetaminophen 325 MG tablet Commonly known as:  TYLENOL Take 650 mg by mouth every 6 (six) hours as needed for mild pain.   albuterol (2.5 MG/3ML) 0.083% nebulizer solution Commonly known as:  PROVENTIL Take 2.5 mg by nebulization every 6 (six) hours as needed for wheezing or shortness of breath.   albuterol 108 (90 Base) MCG/ACT inhaler Commonly known as:  PROVENTIL HFA;VENTOLIN HFA Inhale 2 puffs into the lungs every 6 (six) hours as needed for wheezing or shortness of breath.   aspirin 325 MG EC tablet Take 325 mg by mouth daily.   CENTRUM tablet Take 1 tablet by mouth daily.   cetirizine 10 MG tablet Commonly known as:  ZYRTEC Take 10 mg by mouth daily.   Fish Oil 1000 MG Caps Take 1,000 mg by mouth every other day.   furosemide 20 MG tablet Commonly known as:  LASIX Take 1 tablet (20 mg total) by mouth daily. Take along with spironolactone   gentamicin ointment 0.1 % Commonly known as:  GARAMYCIN Apply 1 application topically at bedtime. Inside nose   GLUCOSAMINE CHONDR 1500 COMPLX PO Take 1 tablet by mouth every morning.   HUMALOG MIX 75/25 KWIKPEN (75-25) 100 UNIT/ML Kwikpen Generic drug:  Insulin Lispro Prot & Lispro Inject 8 Units into the skin 2 (two) times daily. 15 units in the morning and 15 units in the evening What  changed:  how much to take  additional instructions   lactulose 10 GM/15ML solution Commonly known as:  CHRONULAC 10cc (2 teaspoons) bid; Titrate to 2-3 semi formed stools a day What changed:  how much to take  how to take this  when to take this  additional instructions   LEVOXYL 50 MCG tablet Generic drug:  levothyroxine Take 50 mcg by mouth daily before breakfast.  modafinil 200 MG tablet Commonly known as:  PROVIGIL Take 200 mg by mouth daily.   mometasone 50 MCG/ACT nasal spray Commonly known as:  NASONEX Place 2 sprays into the nose daily as needed (FOR CONGESTION/ALLERGIES).   nitroGLYCERIN 0.4 MG SL tablet Commonly known as:  NITROSTAT Place 1 tablet (0.4 mg total) under the tongue every 5 (five) minutes as needed for chest pain.   NON FORMULARY Bipap 16.0/e   omeprazole 40 MG capsule Commonly known as:  PRILOSEC TAKE 1 CAPSULE BY MOUTH ONCE DAILY FOR REFLUX.   spironolactone 50 MG tablet Commonly known as:  ALDACTONE TAKE 1 TABLET BY MOUTH ONCE DAILY.   ursodiol 500 MG tablet Commonly known as:  ACTIGALL TAKE 1 TABLET BY MOUTH TWICE DAILY      Follow-up Information    Sandi Mariscal, MD Follow up in 4 week(s).   Specialty:  Interventional Radiology Why:  pt will hear from scheduler for follow up appt date and time; call [phone] if questions/concerns Contact information: [address] [place] [address] [phone]            Electronically Signed: Monia Sabal A 01/13/2017, 9:34 AM   I have spent Greater Than 30 Minutes discharging Sherran Needs.

## 2017-01-13 NOTE — Progress Notes (Signed)
   01/13/17 0148  BiPAP/CPAP/SIPAP  BiPAP/CPAP/SIPAP Pt Type Adult  Mask Type Full face mask  Mask Size Medium  Set Rate 0 breaths/min  Respiratory Rate 15 breaths/min  IPAP 16 cmH20  EPAP 11 cmH2O  Oxygen Percent 30 %  Flow Rate 3 lpm  BiPAP/CPAP/SIPAP BiPAP  Patient Home Equipment No  Auto Titrate No  Patient placed on Bipap on above settings per MD order.

## 2017-01-13 NOTE — Care Management Note (Signed)
Case Management Note  Patient Details  Name: [name] MRN: [id number] Date of Birth: 20-Apr-1961  Subjective/Objective:   Recurrent ascites                 Action/Plan: Discharge Planning: NCM spoke to pt and wife, [name] at bedside. States pt was active with [facility] for [facility]. Contacted [place], Hassan Rowan # [phone] fax # [phone]. They have orders for [facility] and will resume care. Wife states [facility] will deliver hospital bed tomorrow. Has portable oxygen to dc home. Oxygen provided by [facility].    PCP Petra Kuba MD  Expected Discharge Date:  01/13/17               Expected Discharge Plan:  [place]  In-House Referral:  NA  Discharge planning Services  CM Consult  Post Acute Care Choice:  Home Health Choice offered to:  Spouse  DME Arranged:  N/A DME Agency:  NA  HH Arranged:  RN, PT, Nurse's Aide [place] Agency:  [place]  Status of Service:  Completed, signed off  If discussed at [place] of Stay Meetings, dates discussed:    Additional Comments:  Erenest Rasher, RN 01/13/2017, 10:46 AM

## 2017-01-13 NOTE — Progress Notes (Signed)
All discharge instructions including upcoming appointments, activity, when to shower, wound care and medications discussed with patient and his wife Olin Hauser.  All patient belongings gathered from room upon discharge. No questions or concerns voiced.  Will take patient to car with O2, patient has own O2 tank in car.

## 2017-01-14 ENCOUNTER — Other Ambulatory Visit: Payer: Self-pay | Admitting: Nurse Practitioner

## 2017-01-14 DIAGNOSIS — R188 Other ascites: Secondary | ICD-10-CM

## 2017-01-17 ENCOUNTER — Other Ambulatory Visit (HOSPITAL_COMMUNITY): Payer: Self-pay | Admitting: Interventional Radiology

## 2017-01-17 ENCOUNTER — Other Ambulatory Visit: Payer: Self-pay | Admitting: *Deleted

## 2017-01-17 DIAGNOSIS — K7460 Unspecified cirrhosis of liver: Secondary | ICD-10-CM

## 2017-01-17 DIAGNOSIS — R188 Other ascites: Principal | ICD-10-CM

## 2017-01-19 NOTE — Addendum Note (Signed)
Addendum  created 01/19/17 1645 by Ollen Bowl, CRNA   Anesthesia Event edited

## 2017-01-20 ENCOUNTER — Other Ambulatory Visit: Payer: Self-pay | Admitting: Nurse Practitioner

## 2017-01-20 ENCOUNTER — Ambulatory Visit (HOSPITAL_COMMUNITY)
Admission: RE | Admit: 2017-01-20 | Discharge: 2017-01-20 | Disposition: A | Discharge status: Home / Self Care | Payer: Medicare Other | Source: Ambulatory Visit | Attending: Nurse Practitioner | Admitting: Nurse Practitioner

## 2017-01-20 DIAGNOSIS — K7460 Unspecified cirrhosis of liver: Secondary | ICD-10-CM | POA: Diagnosis not present

## 2017-01-20 DIAGNOSIS — R188 Other ascites: Secondary | ICD-10-CM

## 2017-01-20 MED ORDER — ALBUMIN HUMAN 25 % IV SOLN
INTRAVENOUS | Status: AC
  Filled 2017-01-20: qty 200

## 2017-01-27 ENCOUNTER — Ambulatory Visit (INDEPENDENT_AMBULATORY_CARE_PROVIDER_SITE_OTHER): Payer: Medicare Other | Admitting: Gastroenterology

## 2017-01-27 ENCOUNTER — Ambulatory Visit (HOSPITAL_COMMUNITY)
Admission: RE | Admit: 2017-01-27 | Discharge: 2017-01-27 | Disposition: A | Discharge status: Home / Self Care | Payer: Medicare Other | Source: Ambulatory Visit | Attending: Gastroenterology | Admitting: Gastroenterology

## 2017-01-27 ENCOUNTER — Encounter: Payer: Self-pay | Admitting: Gastroenterology

## 2017-01-27 VITALS — BP 107/61 | HR 109 | Temp 96.7°F | Ht 63.0 in | Wt 138.0 lb

## 2017-01-27 DIAGNOSIS — Z87828 Personal history of other (healed) physical injury and trauma: Secondary | ICD-10-CM | POA: Insufficient documentation

## 2017-01-27 DIAGNOSIS — G3189 Other specified degenerative diseases of nervous system: Secondary | ICD-10-CM | POA: Diagnosis not present

## 2017-01-27 DIAGNOSIS — K7460 Unspecified cirrhosis of liver: Secondary | ICD-10-CM

## 2017-01-27 DIAGNOSIS — R9082 White matter disease, unspecified: Secondary | ICD-10-CM | POA: Diagnosis not present

## 2017-01-27 NOTE — Progress Notes (Signed)
Referring Provider: Petra Kuba, MD Primary Care Physician:  Petra Kuba, MD Primary GI: Dr. Gala Romney   Chief Complaint  Patient presents with  . Cirrhosis  . Constipation    HPI:   [name] is a 56 y.o. male presenting today with a history of NASH cirrhosis, history of ETOH use, treated empirically for AMA negative PBC. Underwent TIPS utilizing a gun-sight technique on 01/12/17. Will need to follow-up with IR on 5/22. Screening EGD due in Jan 2019.   Wife has noticed "stuttering" lately. Started last week. Wife has to ask a few times "what did you say?" Stays thirsty all the time. Throat is dry. Wife states he hit his head hard on a wardrobe twice and feels that's when his speech changed, which happened about a week after the TIPS. Has been feeling tired lately. Sleeping a lot lately. No mental status changes or confusion. No weakness, difficulty walking, blurred vision.   Abdomen without fluid. No lower extremity edema.   Past Medical History:  Diagnosis Date  . Alcohol use   . Anemia   . Aortic stenosis    mild  . Arthritis   . Atrial flutter ([place])   . Benign hypertrophy of prostate    with urinary retention; repaired hypospadia; transurethral resection of the prostate   . Cardiac conduction disorder    status post pacemaker implantation in 1995 generator replaced 2005  . Chest pain 2007, 2009   cardiac cath > normal coronary arterie; nl left ventricular function  . CHF (congestive heart failure) ([place])   . Cirrhosis ([place]) 11/2016  . Congenital deafness   . COPD (chronic obstructive pulmonary disease) ([place])   . Diabetes mellitus    +Insulin  . Dyspnea   . Edema   . GERD (gastroesophageal reflux disease)    status post multiple dilatations for stricture  . 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 Hepatitis    Thinks it was B  . History of kidney stones   . HTN (hypertension)   .  Hyperlipidemia    statin discontinued due to abn LFTs  . Hypothyroidism   . Mitral regurgitation    insignificant  . Obesity   . Obesity 4/08   BMI= 40  . Pelvic mass    identified in 2009, stable 2010  . PONV (postoperative nausea and vomiting)   . Presence of permanent cardiac pacemaker   . Sleep apnea    BiPAP and continuous oxygen  . Syncope   . Thyroid disease     Past Surgical History:  Procedure Laterality Date  . A-V CARDIAC PACEMAKER INSERTION  1995  . COLONOSCOPY  2008  . COLONOSCOPY N/A 06/23/2015   MBT:DHRCBUL diverticulosis  . ESOPHAGOGASTRODUODENOSCOPY N/A 06/23/2015   AGT:XMIW portal gastropathy  . IR GENERIC HISTORICAL  12/14/2016   IR PARACENTESIS 12/14/2016 Sandi Mariscal, MD [facility]-INTERV RAD  . IR GENERIC HISTORICAL  12/14/2016   IR TIPS 12/14/2016 Sandi Mariscal, MD [facility]-INTERV RAD  . IR PARACENTESIS  01/12/2017  . IR TIPS  01/12/2017  . NASAL SINUS SURGERY     partially removed  . ORCHIECTOMY  1990   bilateral; ?neoplasm  . PACEMAKER INSERTION  05/2004; 10/04/2013   MDT dual chamber pacemaker; gen change 10/04/2013 (MDT ADDRL1)  . PERMANENT PACEMAKER GENERATOR CHANGE N/A 10/04/2013   Procedure: PERMANENT PACEMAKER GENERATOR CHANGE;  Surgeon: Evans Lance, MD;  [facility] C[facility] LAB;  Service:  Cardiovascular;  Laterality: N/A;  . RADIOLOGY WITH ANESTHESIA N/A 12/14/2016   Procedure: TIPS;  Surgeon: Sandi Mariscal, MD;  Location: [place];  Service: Radiology;  Laterality: N/A;  . RADIOLOGY WITH ANESTHESIA N/A 01/12/2017   Procedure: TIPS;  Surgeon: Sandi Mariscal, MD;  Location: [place];  Service: Radiology;  Laterality: N/A;  . REPAIR HYPOSPADIAS W/ URETHROPLASTY    . TONSILLECTOMY AND ADENOIDECTOMY    . TRANSURETHRAL RESECTION OF PROSTATE     for BPH    Current Outpatient Prescriptions  Medication Sig Dispense Refill  . acetaminophen (TYLENOL) 325 MG tablet Take 650 mg by mouth every 6 (six) hours as needed for mild pain.    Marland Kitchen albuterol (PROVENTIL HFA;VENTOLIN HFA) 108 (90 Base)  MCG/ACT inhaler Inhale 2 puffs into the lungs every 6 (six) hours as needed for wheezing or shortness of breath.    Marland Kitchen albuterol (PROVENTIL) (2.5 MG/3ML) 0.083% nebulizer solution Take 2.5 mg by nebulization every 6 (six) hours as needed for wheezing or shortness of breath.    Marland Kitchen aspirin 325 MG EC tablet Take 325 mg by mouth daily.    . cetirizine (ZYRTEC) 10 MG tablet Take 10 mg by mouth daily.    . furosemide (LASIX) 20 MG tablet Take 1 tablet (20 mg total) by mouth daily. Take along with spironolactone 90 tablet 3  . gentamicin ointment (GARAMYCIN) 0.1 % Apply 1 application topically at bedtime. Inside nose     . Glucosamine-Chondroit-Vit C-Mn (GLUCOSAMINE CHONDR 1500 COMPLX PO) Take 1 tablet by mouth every morning.    Marland Kitchen HUMALOG MIX 75/25 KWIKPEN (75-25) 100 UNIT/ML Kwikpen Inject 8 Units into the skin 2 (two) times daily. 15 units in the morning and 15 units in the evening (Patient taking differently: Inject 0-8 Units into the skin 2 (two) times daily. Takes 8 units every morning. Takes a dose in the evening using a sliding scale.) 10 mL 0  . lactulose (CHRONULAC) 10 GM/15ML solution 10cc (2 teaspoons) bid; Titrate to 2-3 semi formed stools a day (Patient taking differently: Take 6.7 g by mouth 2 (two) times daily. 10cc (2 teaspoons) bid; Titrate to 2-3 semi formed stools a day) 473 mL 11  . levothyroxine (LEVOXYL) 50 MCG tablet Take 50 mcg by mouth daily before breakfast.     . modafinil (PROVIGIL) 200 MG tablet Take 200 mg by mouth daily.     . mometasone (NASONEX) 50 MCG/ACT nasal spray Place 2 sprays into the nose daily as needed (FOR CONGESTION/ALLERGIES).    . Multiple Vitamins-Minerals (CENTRUM) tablet Take 1 tablet by mouth daily.      . nitroGLYCERIN (NITROSTAT) 0.4 MG SL tablet Place 1 tablet (0.4 mg total) under the tongue every 5 (five) minutes as needed for chest pain. 25 tablet 3  . NON FORMULARY Bipap 16.0/e    . Omega-3 Fatty Acids (FISH OIL) 1000 MG CAPS Take 1,000 mg by mouth every  other day.     Marland Kitchen omeprazole (PRILOSEC) 40 MG capsule TAKE 1 CAPSULE BY MOUTH ONCE DAILY FOR REFLUX. 30 capsule 1  . spironolactone (ALDACTONE) 50 MG tablet TAKE 1 TABLET BY MOUTH ONCE DAILY. 30 tablet 1  . ursodiol (ACTIGALL) 500 MG tablet TAKE 1 TABLET BY MOUTH TWICE DAILY 60 tablet 11   No current facility-administered medications for this visit.     Allergies as of 01/27/2017 - Review Complete 01/27/2017  Allergen Reaction Noted  . Penicillins Swelling   . Enalapril Cough 04/15/2011  . Metformin and related Diarrhea 07/30/2013  .  Eliquis [apixaban] Other (See Comments) 03/18/2014    Family History  Problem Relation Age of Onset  . COPD Mother   . Hypertension Mother   . Congestive Heart Failure Mother   . Pneumonia Father   . Heart disease Other   . Hypertension Other   . Colon cancer Neg Hx     Social History   Social History  . Marital status: Married    Spouse name: N/A  . Number of children: N/A  . Years of education: N/A   Social History Main Topics  . Smoking status: Never Smoker  . Smokeless tobacco: Never Used     Comment: tobacco use- no   . Alcohol use No     Comment: Hx. of alcohol use  . Drug use: No  . Sexual activity: Yes   Other Topics Concern  . None   Social History Narrative   Part time.     Review of Systems: As mentioned in HPI   Physical Exam: BP 107/61   Pulse (!) 109   Temp (!) 96.7 F (35.9 C) (Oral)   Ht 5\' 3"  (1.6 m)   Wt 138 lb (62.6 kg)   BMI 24.45 kg/m  General:   Alert and oriented. No distress noted. Pleasant and cooperative.  Head:  Normocephalic and atraumatic. Eyes:  Conjuctiva clear without scleral icterus. Mouth:  Oral mucosa pink and moist. Good dentition. No lesions. No tongue deviation Abdomen:  +BS, soft, non-tender and non-distended.  Msk:  Symmetrical without gross deformities. Equal grip strength, good ROM Extremities:  Without edema, chronic venous stasis changes Neurologic:  Alert and  oriented x4;   Speech at baseline but with two occasions of unintelligible words which wife notes as "slurring" Psych:  Alert and cooperative. Normal mood and affect.

## 2017-01-27 NOTE — Patient Instructions (Signed)
Today, I want you to get the CT of your head done. Stay there in radiology until you are told to leave.   Stop taking Lasix and aldactone for now. I would like to see how you do without this.  Further recommendations to follow!

## 2017-01-27 NOTE — Assessment & Plan Note (Signed)
Wife states patient experienced a fall hitting his head twice last week, and she believes this is when speech began to slur intermittently. He also notes increased fatigue and sleeping more. No mental status changes or confusion noted. Does not appear encephalopathic. Brief neuro exam intact. Symptoms for 1 week. As of note, he has historically been difficult to understand speech-wise, and both he and the wife are poor historians. However, I am sending him for a stat CT of his head. It is unclear if the fall precipitated these changes or if something else is going on. Stat CT head today. Further recommendations to follow.

## 2017-01-27 NOTE — Progress Notes (Signed)
cc'ed to pcp °

## 2017-01-27 NOTE — Addendum Note (Signed)
Addended by: Annitta Needs on: 01/27/2017 12:25 PM   Modules accepted: Level of Service

## 2017-01-27 NOTE — Assessment & Plan Note (Addendum)
NASH cirrhosis, history of ETOH use, treated empirically for AMA negative PBC. Underwent TIPS utilizing a gun-sight technique on 01/12/17. Will need to follow-up with IR on 5/22. Screening EGD due in Jan 2019. Doing well s/p TIPS. Will have him discontinue lasix and aldactone for now with close monitoring. Labs per IR due in 1 week, which we will follow as well. No evidence of encephalopathy.

## 2017-01-28 NOTE — Progress Notes (Signed)
Late entry: CT head without acute findings. He does have right mastoid effusion, likely secondary to ear infection untreated. Needs to see PCP. Would also recommend neurology referral due to changes in speech.

## 2017-01-31 ENCOUNTER — Other Ambulatory Visit (HOSPITAL_COMMUNITY): Payer: Self-pay | Admitting: Interventional Radiology

## 2017-02-01 LAB — CMP 10231
AG RATIO: 1.2 ratio (ref 1.0–2.5)
ALK PHOS: 573 U/L — AB (ref 40–115)
ALT: 155 U/L — ABNORMAL HIGH (ref 9–46)
AST: 180 U/L — AB (ref 10–35)
Albumin: 2.6 g/dL — ABNORMAL LOW (ref 3.6–5.1)
BUN / CREAT RATIO: 26.3 ratio — AB (ref 6–22)
BUN: 25 mg/dL (ref 7–25)
CHLORIDE: 105 mmol/L (ref 98–110)
CO2: 19 mmol/L — ABNORMAL LOW (ref 20–31)
CREATININE: 0.95 mg/dL (ref 0.70–1.33)
Calcium: 8.3 mg/dL — ABNORMAL LOW (ref 8.6–10.3)
GFR, Est Non African American: 89 mL/min (ref >=60)
Globulin: 2.2 g/dL (ref 1.9–3.7)
Glucose, Bld: 128 mg/dL — ABNORMAL HIGH (ref 65–99)
POTASSIUM: 4.5 mmol/L (ref 3.5–5.3)
SODIUM: 140 mmol/L (ref 135–146)
Total Bilirubin: 2 mg/dL — ABNORMAL HIGH (ref 0.2–1.2)
Total Protein: 4.8 g/dL — ABNORMAL LOW (ref 6.1–8.1)

## 2017-02-01 LAB — PROTIME-INR
INR: 1.1
PROTHROMBIN TIME: 11.8 s — AB (ref 9.0–11.5)

## 2017-02-02 ENCOUNTER — Other Ambulatory Visit: Payer: Self-pay

## 2017-02-02 DIAGNOSIS — R4789 Other speech disturbances: Secondary | ICD-10-CM

## 2017-02-02 NOTE — Progress Notes (Signed)
I communicated the findings with [name], home health care nurse and she explained everything to the patient while I was on the phone.  She will call his pcp to see about getting his appt moved sooner than 6/5.  She wanted to let [name] know that his weight has increased, please see weight changes below:  134.1 on 5/9 143.8 on 5/16  She wanted to know is [name] would like to make any changes to his diuretics.  Please make referral to neurology and call the patient with the appt

## 2017-02-08 ENCOUNTER — Ambulatory Visit
Admission: RE | Admit: 2017-02-08 | Discharge: 2017-02-08 | Disposition: A | Discharge status: Home / Self Care | Payer: Medicare Other | Source: Ambulatory Visit | Attending: Radiology | Admitting: Radiology

## 2017-02-08 ENCOUNTER — Ambulatory Visit
Admission: RE | Admit: 2017-02-08 | Discharge: 2017-02-08 | Disposition: A | Discharge status: Home / Self Care | Payer: Medicare Other | Source: Ambulatory Visit | Attending: Interventional Radiology | Admitting: Interventional Radiology

## 2017-02-08 DIAGNOSIS — K7460 Unspecified cirrhosis of liver: Secondary | ICD-10-CM

## 2017-02-08 DIAGNOSIS — R188 Other ascites: Principal | ICD-10-CM

## 2017-02-08 HISTORY — PX: IR RADIOLOGIST EVAL & MGMT: IMG5224

## 2017-02-08 NOTE — Progress Notes (Signed)
Patient ID: CASE VASSELL, male   DOB: 01-Aug-1961, 56 y.o.   MRN: [id number]        Chief Complaint: Post TIPS  Referring Physician(s): Linde Gillis (Cardiology)  History of Present Illness:  Nicholas Berg Robertsonis a 56 y.o.malewith complex medical history significant for congenital deafness, hypertension, hyperlipidemia, aortic stenosis,mitral regurgitation,atrial flutter, post pacemaker insertion, COPD (on 2 L via nasal cannula continuously), obstructive sleep apnea necessitating BiPAP diabetes, anemia, history of alcohol abuse and a combinationof alcoholic and NASH cirrhosis who initially underwent attempted TIPS creation on 12/14/2016, and whom ultimately successful TIPS creation via gun-sight technique performed on 01/12/2017.  He returns to the interventional radiology clinic today for postprocedural evaluation and management following the acquisition of a TIPS surveillance ultrasound. He is accompanied by his wife.  The patient recovered uneventfully from the successful TIPS creation and has not had to go undergo any subsequent paracentesis. Ascites search ultrasound performed 01/20/2017 demonstrated a small amount of intra-abdominal ascites, too small to allow for safe ultrasound guided paracentesis.  The patient and the patient's wife states his mental status is unchanged when compared to prior to the procedure. He has noticed an improvement in his functional status and activity level, currently ambulating with a single-point cane and participating with physical therapy. He has noticed an improvement in his appetite.  Note, given patient's lack of recurrent ascites, his primary care physician has stopped his diuretics (Lasix and spinal lactone) on the 10th of this Month and this medication adjustment has not resulted in fluid recurrence. Additionally, the patient denies chest pain or shortness of breath. No lower extremity edema.  The patient and the patient's family are  overall pleased with the outcome of the procedure.   Past Medical History:  Diagnosis Date  . Alcohol use   . Anemia   . Aortic stenosis    mild  . Arthritis   . Atrial flutter ([place])   . Benign hypertrophy of prostate    with urinary retention; repaired hypospadia; transurethral resection of the prostate   . Cardiac conduction disorder    status post pacemaker implantation in 1995 generator replaced 2005  . Chest pain 2007, 2009   cardiac cath > normal coronary arterie; nl left ventricular function  . CHF (congestive heart failure) ([place])   . Cirrhosis ([place]) 11/2016  . Congenital deafness   . COPD (chronic obstructive pulmonary disease) ([place])   . Diabetes mellitus    +Insulin  . Dyspnea   . Edema   . GERD (gastroesophageal reflux disease)    status post multiple dilatations for stricture  . 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 Hepatitis    Thinks it was B  . History of kidney stones   . HTN (hypertension)   . Hyperlipidemia    statin discontinued due to abn LFTs  . Hypothyroidism   . Mitral regurgitation    insignificant  . Obesity   . Obesity 4/08   BMI= 40  . Pelvic mass    identified in 2009, stable 2010  . PONV (postoperative nausea and vomiting)   . Presence of permanent cardiac pacemaker   . Sleep apnea    BiPAP and continuous oxygen  . Syncope   . Thyroid disease     Past Surgical History:  Procedure Laterality Date  . A-V CARDIAC PACEMAKER INSERTION  1995  . COLONOSCOPY  2008  . COLONOSCOPY N/A 06/23/2015   LZJ:[id number] diverticulosis  .  ESOPHAGOGASTRODUODENOSCOPY N/A 06/23/2015   KGM:WNUU portal gastropathy  . IR GENERIC HISTORICAL  12/14/2016   IR PARACENTESIS 12/14/2016 Sandi Mariscal, MD [facility]  . IR GENERIC HISTORICAL  12/14/2016   IR TIPS 12/14/2016 Sandi Mariscal, MD [facility]  . IR PARACENTESIS  01/12/2017  . IR TIPS  01/12/2017  . NASAL SINUS SURGERY     partially removed  .  ORCHIECTOMY  1990   bilateral; ?neoplasm  . PACEMAKER INSERTION  05/2004; 10/04/2013   MDT dual chamber pacemaker; gen change 10/04/2013 (MDT ADDRL1)  . PERMANENT PACEMAKER GENERATOR CHANGE N/A 10/04/2013   Procedure: PERMANENT PACEMAKER GENERATOR CHANGE;  Surgeon: Evans Lance, MD;  Location: [facility] CATH LAB;  Service: Cardiovascular;  Laterality: N/A;  . RADIOLOGY WITH ANESTHESIA N/A 12/14/2016   Procedure: TIPS;  Surgeon: Sandi Mariscal, MD;  Location: [place];  Service: Radiology;  Laterality: N/A;  . RADIOLOGY WITH ANESTHESIA N/A 01/12/2017   Procedure: TIPS;  Surgeon: Sandi Mariscal, MD;  Location: [place];  Service: Radiology;  Laterality: N/A;  . REPAIR HYPOSPADIAS W/ URETHROPLASTY    . TONSILLECTOMY AND ADENOIDECTOMY    . TRANSURETHRAL RESECTION OF PROSTATE     for BPH    Allergies: Penicillins; Enalapril; Metformin and related; and Eliquis [apixaban]  Medications: Prior to Admission medications   Medication Sig Start Date End Date Taking? Authorizing Provider  acetaminophen (TYLENOL) 325 MG tablet Take 650 mg by mouth every 6 (six) hours as needed for mild pain.   Yes [provider]  albuterol (PROVENTIL HFA;VENTOLIN HFA) 108 (90 Base) MCG/ACT inhaler Inhale 2 puffs into the lungs every 6 (six) hours as needed for wheezing or shortness of breath.   Yes [provider]  albuterol (PROVENTIL) (2.5 MG/3ML) 0.083% nebulizer solution Take 2.5 mg by nebulization every 6 (six) hours as needed for wheezing or shortness of breath.   Yes [provider]  aspirin 325 MG EC tablet Take 325 mg by mouth daily.   Yes [provider]  cetirizine (ZYRTEC) 10 MG tablet Take 10 mg by mouth daily.   Yes [provider]  gentamicin ointment (GARAMYCIN) 0.1 % Apply 1 application topically at bedtime. Inside nose    Yes [provider]  Glucosamine-Chondroit-Vit C-Mn (GLUCOSAMINE CHONDR 1500 COMPLX PO) Take 1 tablet by mouth every morning.   Yes [provider]  HUMALOG MIX 75/25 KWIKPEN (75-25) 100 UNIT/ML Kwikpen Inject 8 Units into the skin 2 (two) times daily. 15 units in the morning and 15 units in the evening Patient taking differently: Inject 0-8 Units into the skin 2 (two) times daily. Takes 8 units every morning. Takes a dose in the evening using a sliding scale. 11/14/16  Yes Eber Jones, MD  lactulose (CHRONULAC) 10 GM/15ML solution 10cc (2 teaspoons) bid; Titrate to 2-3 semi formed stools a day Patient taking differently: Take 6.7 g by mouth 2 (two) times daily. 10cc (2 teaspoons) bid; Titrate to 2-3 semi formed stools a day 11/14/16  Yes Eber Jones, MD  levothyroxine (LEVOXYL) 50 MCG tablet Take 50 mcg by mouth daily before breakfast.    Yes [provider]  modafinil (PROVIGIL) 200 MG tablet Take 200 mg by mouth daily.  01/13/15  Yes [provider]  mometasone (NASONEX) 50 MCG/ACT nasal spray Place 2 sprays into the nose daily as needed (FOR CONGESTION/ALLERGIES).   Yes [provider]  Multiple Vitamins-Minerals (CENTRUM) tablet Take 1 tablet by mouth daily.     Yes [provider]  nitroGLYCERIN (NITROSTAT) 0.4 MG SL tablet Place 1 tablet (0.4 mg total) under the tongue every 5 (five) minutes as needed for chest pain. 06/26/15  Yes Lendon Colonel, NP  NON FORMULARY Bipap 16.0/e   Yes [provider]  Omega-3 Fatty Acids (FISH OIL) 1000 MG CAPS Take 1,000 mg by mouth every other day.    Yes [provider]  omeprazole (PRILOSEC) 40 MG capsule TAKE 1 CAPSULE BY MOUTH ONCE DAILY FOR REFLUX. 12/15/16  Yes Branch, Alphonse Guild, MD  ursodiol (ACTIGALL) 500 MG tablet TAKE 1 TABLET BY MOUTH TWICE DAILY 08/11/16  Yes Carlis Stable, NP  furosemide (LASIX) 20 MG tablet Take 1 tablet (20 mg total) by mouth daily. Take along with spironolactone Patient not taking: Reported on 02/08/2017 11/22/16   Annitta Needs, NP  spironolactone (ALDACTONE) 50 MG tablet TAKE 1 TABLET BY MOUTH ONCE  DAILY. Patient not taking: Reported on 02/08/2017 12/15/16   Arnoldo Lenis, MD     Family History  Problem Relation Age of Onset  . COPD Mother   . Hypertension Mother   . Congestive Heart Failure Mother   . Pneumonia Father   . Heart disease Other   . Hypertension Other   . Colon cancer Neg Hx     Social History   Social History  . Marital status: Married    Spouse name: N/A  . Number of children: N/A  . Years of education: N/A   Social History Main Topics  . Smoking status: Never Smoker  . Smokeless tobacco: Never Used     Comment: tobacco use- no   . Alcohol use No     Comment: Hx. of alcohol use  . Drug use: No  . Sexual activity: Yes   Other Topics Concern  . Not on file   Social History Narrative   Part time.     ECOG Status: 1 - Symptomatic but completely ambulatory  Review of Systems: A 12 point ROS discussed and pertinent positives are indicated in the HPI above.  All other systems are negative.  Review of Systems  Constitutional: Positive for activity change and appetite change. Negative for fatigue and fever.       Improved appetite and energy level.  Respiratory: Negative.   Cardiovascular: Negative.   Gastrointestinal: Negative for abdominal distention.  Skin: Negative.   Psychiatric/Behavioral: Negative for confusion.    Vital Signs: BP 110/79   Pulse 91   Temp 97.5 F (36.4 C) (Oral)   Resp 14   Ht 5\' 3"  (1.6 m)   Wt 149 lb 11.2 oz (67.9 kg)   SpO2 98%   BMI 26.52 kg/m   Physical Exam  Constitutional: He appears well-developed and well-nourished.  Abdominal: Soft. There is no tenderness.  Psychiatric: He has a normal mood and affect. His behavior is normal.  Nursing note reviewed.   Imaging: Ct Head Wo Contrast  Result Date: 01/27/2017 CLINICAL DATA:  [place] [address] 1 week ago and hit top of head on cabinet. Slurred speech. EXAM: CT HEAD WITHOUT CONTRAST TECHNIQUE: Contiguous axial images were obtained from the base of the skull  through the vertex without intravenous contrast. COMPARISON:  12/09/2006. FINDINGS: Brain: There is no evidence for acute hemorrhage, hydrocephalus, mass lesion, or abnormal extra-axial fluid collection. No definite CT evidence for acute infarction. Diffuse loss of parenchymal volume is consistent with atrophy. Patchy low attenuation in the deep hemispheric and periventricular white matter is nonspecific, but likely reflects chronic microvascular ischemic demyelination. Vascular:  No hyperdense vessel or unexpected calcification. Skull: More prominent sclerotic change seen diffusely in the skull likely related to underlying systemic disease. Sinuses/Orbits: Fluid is visible in some of the right mastoid air cells. Visualized portions of the globes and intraorbital fat are unremarkable. Other: None. IMPRESSION: 1. No acute intracranial abnormality. 2. Atrophy with chronic small vessel white matter ischemic disease. 3. Fluid in the right mastoid air cells compatible with mastoid effusion. Electronically Signed   By: Misty Stanley M.D.   On: 01/27/2017 13:16   US Abdomen Limited  Result Date: 01/20/2017 CLINICAL DATA:  Cirrhosis, ascites, recent tips EXAM: LIMITED ABDOMEN ULTRASOUND FOR ASCITES TECHNIQUE: Limited ultrasound survey for ascites was performed in all four abdominal quadrants. COMPARISON:  01/05/2017 FINDINGS: Small amounts of ascites are seen in the upper quadrants bilaterally adjacent to the liver and spleen. No additional ascites is identified. Insufficient ascites for paracentesis. IMPRESSION: Insufficient ascites for paracentesis. Electronically Signed   By: Lavonia Dana M.D.   On: 01/20/2017 10:51   Ir Tips  Result Date: 01/12/2017 CLINICAL DATA:  History of alcoholic cirrhosis with recurrent symptomatic ascites. Patient underwent attempted though ultimately unsuccessful TIPS creation on 12/14/2016 and presents today for repeat attempted TIPS creation with use of a gun sight technique. Please  refer to formal consultations in epic EMR, last consultation on 01/06/2017. EXAM: 1. TRANSJUGULAR INTRAHEPATIC PORTOSYSTEMIC SHUNT (GUN SIGHT TECHNIQUE) 2. IR PARACENTESIS MEDICATIONS: As antibiotic prophylaxis, Vancomycin 1 gm IV was ordered pre-procedure and administered intravenously within one hour of incision. ANESTHESIA/SEDATION: General - as administered by the Anesthesia department CONTRAST:  100 cc Isovue-300 FLUOROSCOPY TIME:  Fluoroscopy Time: 82 minutes 36 seconds (867 mGy). COMPLICATIONS: None immediate. PROCEDURE: Informed written consent was obtained from the patient and the patient's wife after a thorough discussion of the procedural risks, benefits and alternatives. All questions were addressed. Maximal Sterile Barrier Technique was utilized including caps, mask, sterile gowns, sterile gloves, sterile drape, hand hygiene and skin antiseptic. A timeout was performed prior to the initiation of the procedure. Initial ultrasound scanning demonstrates a moderate amount of ascites within the right lower abdominal quadrant. The right lower abdomen was prepped and draped in the usual sterile fashion. 1% lidocaine with epinephrine was used for local anesthesia. An ultrasound image was saved for documentation purposed. An 8 Fr Safe-T-Centesis catheter was introduced. The paracentesis was performed as we proceeded with the TIPS creation. The skin overlying the right upper abdominal quadrant as well as the right neck were prepped and draped in usual sterile fashion. Under direct ultrasound guidance, a peripheral aspect of the right portal vein was accessed with a 22 gauge needle. Un ultrasound image was saved for procedural documentation purposes. A Nitrex wire was advanced into the central aspect of the portal vein and the track was dilated with the Accustick set. Several contrast limited portal venograms were performed. Next, the right internal jugular vein was accessed under direct ultrasound. Ultrasound  image was saved for procedural documentation purposes. This allowed for placement of the 10 French TIPS vascular sheath. With the use of a stiff Glidewire, an MPA catheter was utilized to select the right hepatic vein and a right hepatic venogram was performed. Next, a 4 [place] One snare was advanced through the Accustick catheter and positioned at the level of the central aspect of the right portal vein. An additional 4 [place] One snare was then advanced through the 10 [place] TIPS sheath and positioned at the level of the desired access point within the right hepatic  vein. A 21 gauge Accustick needle was then utilized to target both overlapping snares. Appropriate positioning was confirmed with various obliquities. Next, a V18 micro wire was advanced through the Accustick needle and coiled within the posterior aspect of the hepatic parenchyma, posterior to the jugular access snare. The Accustick needle was then slowly retracted and the end of the microwire was grasped with the jugular snare and removed through the 10 French jugular TIPS sheath. With through and through access now achieved, 0.018 and 0.035 quick cross catheters loaded coaxially and advanced through the hepatic parenchymal track from the jugular access. Once the 0.035 quick cross catheter was positioned within the portal vein, the inner microcatheter was removed and an additional V18 microwire was cannulated within the 0.035 catheter alongside the safety (through and through) wire, and advanced into the central aspect of the portal vein. Once appropriate positioning was confirmed, the safety wire was removed and the 0.035 catheter was advanced into the central aspect of the portal vein. Portal venogram was performed demonstrating appropriate positioning. The remaining snare was then removed. Next, prolonged efforts were made to advance the TIPS sheath through the hepatic parenchymal track which ultimately proved very difficult despite the use of 4  mm, 6 mm and 8 mm diameter angioplasty balloons and a multiple telescoping 4 and 7 French sheaths. Ultimately, with the use of a Lunderquist wire, the TIPS sheath was advanced through the hepatic parenchymal track and into the main aspect of the portal vein. A portal venogram was performed with a measuring Omni Flush catheter. Next, a 2 cm (un covered) x 8 cm (covered) x 10 mm Viator stent was advanced through the sheath and deployed. The TIPS stent was angioplastied at multiple stations to 10 mm diameter. Completion portal venograms were performed in various obliquities. The procedure was terminated. All wires, catheters and sheaths were removed from the patient. Hemostasis was achieved at the right neck access site with manual compression. Under intermittent fluoroscopic guidance, the hepatic access was retracted as a Gel-Foam slurry was administered. Superficial hemostasis achieved with manual compression. The paracentesis catheter was removed and superficial hemostasis was achieved with manual compression. Dressings were placed. The patient tolerated the procedure well without immediate postprocedural complication, was extubated and transported to the PACU for recovery. FINDINGS: Ultrasound scanning demonstrates recurrent moderate volume intra- abdominal ascites. Ultrasound-guided paracentesis was performed yielding 4.4 L of ascitic fluid. Technically difficult though ultimately successful creation of a TIPS via the right hepatic and right portal veins utilizing a gun sight technique as detailed above. Postprocedural portal venogram was negative for the presence of any gastroesophageal varices and as such, embolization was not performed. IMPRESSION: 1. Successful creation of a TIPS utilizing a gun sight technique. 2. Successful ultrasound-guided paracentesis yielding 4.4 L of ascitic fluid. PLAN: - Patient will be admitted for overnight observation in hopes of discharge tomorrow. - Patient be seen in follow-up  consultation at the interventional radiology clinic with postprocedural TIPS ultrasound and labs (CMP, INR and ammonia levels) in 4-6 weeks. Electronically Signed   By: Sandi Mariscal M.D.   On: 01/12/2017 16:59   Ir Paracentesis  Result Date: 01/12/2017 CLINICAL DATA:  History of alcoholic cirrhosis with recurrent symptomatic ascites. Patient underwent attempted though ultimately unsuccessful TIPS creation on 12/14/2016 and presents today for repeat attempted TIPS creation with use of a gun sight technique. Please refer to formal consultations in epic EMR, last consultation on 01/06/2017. EXAM: 1. TRANSJUGULAR INTRAHEPATIC PORTOSYSTEMIC SHUNT (GUN SIGHT TECHNIQUE) 2. IR PARACENTESIS MEDICATIONS:  As antibiotic prophylaxis, Vancomycin 1 gm IV was ordered pre-procedure and administered intravenously within one hour of incision. ANESTHESIA/SEDATION: General - as administered by the Anesthesia department CONTRAST:  100 cc Isovue-300 FLUOROSCOPY TIME:  Fluoroscopy Time: 82 minutes 36 seconds (867 mGy). COMPLICATIONS: None immediate. PROCEDURE: Informed written consent was obtained from the patient and the patient's wife after a thorough discussion of the procedural risks, benefits and alternatives. All questions were addressed. Maximal Sterile Barrier Technique was utilized including caps, mask, sterile gowns, sterile gloves, sterile drape, hand hygiene and skin antiseptic. A timeout was performed prior to the initiation of the procedure. Initial ultrasound scanning demonstrates a moderate amount of ascites within the right lower abdominal quadrant. The right lower abdomen was prepped and draped in the usual sterile fashion. 1% lidocaine with epinephrine was used for local anesthesia. An ultrasound image was saved for documentation purposed. An 8 Fr Safe-T-Centesis catheter was introduced. The paracentesis was performed as we proceeded with the TIPS creation. The skin overlying the right upper abdominal quadrant as well  as the right neck were prepped and draped in usual sterile fashion. Under direct ultrasound guidance, a peripheral aspect of the right portal vein was accessed with a 22 gauge needle. Un ultrasound image was saved for procedural documentation purposes. A Nitrex wire was advanced into the central aspect of the portal vein and the track was dilated with the Accustick set. Several contrast limited portal venograms were performed. Next, the right internal jugular vein was accessed under direct ultrasound. Ultrasound image was saved for procedural documentation purposes. This allowed for placement of the 10 French TIPS vascular sheath. With the use of a stiff Glidewire, an MPA catheter was utilized to select the right hepatic vein and a right hepatic venogram was performed. Next, a 4 [place] One snare was advanced through the Accustick catheter and positioned at the level of the central aspect of the right portal vein. An additional 4 [place] One snare was then advanced through the 10 [place] TIPS sheath and positioned at the level of the desired access point within the right hepatic vein. A 21 gauge Accustick needle was then utilized to target both overlapping snares. Appropriate positioning was confirmed with various obliquities. Next, a V18 micro wire was advanced through the Accustick needle and coiled within the posterior aspect of the hepatic parenchyma, posterior to the jugular access snare. The Accustick needle was then slowly retracted and the end of the microwire was grasped with the jugular snare and removed through the 10 French jugular TIPS sheath. With through and through access now achieved, 0.018 and 0.035 quick cross catheters loaded coaxially and advanced through the hepatic parenchymal track from the jugular access. Once the 0.035 quick cross catheter was positioned within the portal vein, the inner microcatheter was removed and an additional V18 microwire was cannulated within the 0.035 catheter  alongside the safety (through and through) wire, and advanced into the central aspect of the portal vein. Once appropriate positioning was confirmed, the safety wire was removed and the 0.035 catheter was advanced into the central aspect of the portal vein. Portal venogram was performed demonstrating appropriate positioning. The remaining snare was then removed. Next, prolonged efforts were made to advance the TIPS sheath through the hepatic parenchymal track which ultimately proved very difficult despite the use of 4 mm, 6 mm and 8 mm diameter angioplasty balloons and a multiple telescoping 4 and 7 French sheaths. Ultimately, with the use of a Lunderquist wire, the TIPS sheath was advanced through  the hepatic parenchymal track and into the main aspect of the portal vein. A portal venogram was performed with a measuring Omni Flush catheter. Next, a 2 cm (un covered) x 8 cm (covered) x 10 mm Viator stent was advanced through the sheath and deployed. The TIPS stent was angioplastied at multiple stations to 10 mm diameter. Completion portal venograms were performed in various obliquities. The procedure was terminated. All wires, catheters and sheaths were removed from the patient. Hemostasis was achieved at the right neck access site with manual compression. Under intermittent fluoroscopic guidance, the hepatic access was retracted as a Gel-Foam slurry was administered. Superficial hemostasis achieved with manual compression. The paracentesis catheter was removed and superficial hemostasis was achieved with manual compression. Dressings were placed. The patient tolerated the procedure well without immediate postprocedural complication, was extubated and transported to the PACU for recovery. FINDINGS: Ultrasound scanning demonstrates recurrent moderate volume intra- abdominal ascites. Ultrasound-guided paracentesis was performed yielding 4.4 L of ascitic fluid. Technically difficult though ultimately successful creation  of a TIPS via the right hepatic and right portal veins utilizing a gun sight technique as detailed above. Postprocedural portal venogram was negative for the presence of any gastroesophageal varices and as such, embolization was not performed. IMPRESSION: 1. Successful creation of a TIPS utilizing a gun sight technique. 2. Successful ultrasound-guided paracentesis yielding 4.4 L of ascitic fluid. PLAN: - Patient will be admitted for overnight observation in hopes of discharge tomorrow. - Patient be seen in follow-up consultation at the interventional radiology clinic with postprocedural TIPS ultrasound and labs (CMP, INR and ammonia levels) in 4-6 weeks. Electronically Signed   By: Sandi Mariscal M.D.   On: 01/12/2017 16:59    Labs:  CBC:  Recent Labs  12/23/16 0952 12/24/16 0601 12/25/16 0557 01/11/17 1142  WBC 15.7* 12.2* 10.1 14.0*  HGB 13.9 11.7* 10.4* 12.8*  HCT 42.8 36.9* 31.9* 41.4  PLT 514* 343 299 530*    COAGS:  Recent Labs  12/10/16 1624 12/14/16 0651 01/11/17 1142 01/31/17 1300  INR 1.09 1.10 1.10 1.1  APTT 35 35 33  --     BMP:  Recent Labs  12/25/16 0557 12/26/16 0610 01/11/17 1142 01/31/17 1300  NA 136 134* 133* 140  K 3.4* 4.7 5.1 4.5  CL 103 104 96* 105  CO2 23 23 26  19*  GLUCOSE 83 117* 234* 128*  BUN 25* 21* 43* 25  CALCIUM 8.3* 8.4* 9.1 8.3*  CREATININE 0.97 0.91 1.30* 0.95  GFRNONAA >60 >60 60* 89  GFRAA >60 >60 >60 >89    LIVER FUNCTION TESTS:  Recent Labs  12/23/16 0952 12/24/16 0601 01/11/17 1142 01/31/17 1300  BILITOT 1.6* 1.0 1.6* 2.0*  AST 59* 44* 50* 180*  ALT 54 41 50 155*  ALKPHOS 406* 325* 409* 573*  PROT 6.5 5.9* 6.2* 4.8*  ALBUMIN 3.2* 3.0* 2.8* 2.6*    TUMOR MARKERS: No results for input(s): AFPTM, CEA, CA199, CHROMGRNA in the last 8760 hours.  Assessment and Plan:  Nicholas Rinks Robertsonis a 56 y.o.malewith complex medical history significant for congenital deafness, hypertension, hyperlipidemia, aortic  stenosis,mitral regurgitation,atrial flutter, post pacemaker insertion, COPD (on 2 L via nasal cannula continuously), obstructive sleep apnea necessitating BiPAP diabetes, anemia, history of alcohol abuse and a combinationof alcoholic and NASH cirrhosis who initially underwent attempted TIPS creation on 12/14/2016, and whom ultimately successful TIPS creation via gun-sight technique performed on 01/12/2017.   The patient has recovered uneventfully from the successful TIPS creation and has not had to  go undergo any subsequent paracentesis.  Ascites search ultrasound performed 01/20/2017 demonstrated a small amount of intra-abdominal ascites, too small to allow for safe ultrasound guided paracentesis.  Surveillance ultrasound performed today is negative for evidence of TIPS dysfunction, though does demonstrate pulsatile flow throughout the hepatic venous system, TIPS and IVC, as could be seen in the setting of right-sided heart failure. Unchanged to minimal increase in small amount of intra-abdominal ascites seen on ascites search ultrasound performed 01/20/2017.  Fortunately, the patient's wife has not noticed any change and the patient's baseline mental status.  Post procedural MELD score (calculated on labs obtained on 01/31/2017) - 10 (pre procedure - 7)  The patient and the patient's wife, as well as myself, are am very pleased with the outcome of this procedure thus far.   As such, patient be seen in follow-up consultation in 6 months with repeat TIPS surveillance ultrasound.  I educated the patient and the patient's family that he may indeed require future paracentesis and that if he were to experience recurrent acute accumulationof ascites, this symptom could be indicative of TIPS failure and/or dysfunction and to not hesitate calling the interventional radiology clinic with any interval questions or concerns.   The patient was encouraged to keep all follow-up appointments, especially in the  setting of his recently altered diuretic medications with his complex cardiac history.  A copy of this report was sent to the requesting provider on this date.  Electronically Signed: Sandi Mariscal 02/08/2017, 10:07 AM   I spent a total of 15 Minutes in face to face in clinical consultation, greater than 50% of which was counseling/coordinating care for post TIPS

## 2017-02-12 ENCOUNTER — Other Ambulatory Visit: Payer: Self-pay | Admitting: Cardiology

## 2017-02-16 ENCOUNTER — Telehealth: Payer: Self-pay

## 2017-02-16 NOTE — Telephone Encounter (Signed)
pts wife called- she said the pt has gained 20 pounds in a week and she wanted to know if he needed another para. I also spoke with the home health nurse, she said she came out to see him last week and he was 143 pounds, today he is 162 pounds. He has 3+ edema in both legs and 1+ edema in both arms. I have made him an appointment for tomorrow with [facility] and they are aware of date and time and will be here for the appt.   Routing to AB for FYI.

## 2017-02-16 NOTE — Telephone Encounter (Signed)
Noted. Does he have a distended abdomen? They need to contact IR as well, as he recently had TIPS procedure.

## 2017-02-17 ENCOUNTER — Encounter: Payer: Self-pay | Admitting: Gastroenterology

## 2017-02-17 ENCOUNTER — Other Ambulatory Visit: Payer: Self-pay | Admitting: Gastroenterology

## 2017-02-17 ENCOUNTER — Encounter (HOSPITAL_COMMUNITY): Payer: Self-pay

## 2017-02-17 ENCOUNTER — Other Ambulatory Visit (HOSPITAL_COMMUNITY)
Admission: RE | Admit: 2017-02-17 | Discharge: 2017-02-17 | Disposition: A | Discharge status: Home / Self Care | Payer: Medicare Other | Source: Ambulatory Visit | Attending: Gastroenterology | Admitting: Gastroenterology

## 2017-02-17 ENCOUNTER — Ambulatory Visit (HOSPITAL_COMMUNITY)
Admission: RE | Admit: 2017-02-17 | Discharge: 2017-02-17 | Disposition: A | Discharge status: Home / Self Care | Payer: Medicare Other | Source: Ambulatory Visit | Attending: Gastroenterology | Admitting: Gastroenterology

## 2017-02-17 ENCOUNTER — Ambulatory Visit (INDEPENDENT_AMBULATORY_CARE_PROVIDER_SITE_OTHER): Payer: Medicare Other | Admitting: Gastroenterology

## 2017-02-17 VITALS — BP 117/79 | HR 94 | Temp 98.1°F | Ht 62.0 in | Wt 167.0 lb

## 2017-02-17 DIAGNOSIS — K7460 Unspecified cirrhosis of liver: Secondary | ICD-10-CM

## 2017-02-17 DIAGNOSIS — R188 Other ascites: Secondary | ICD-10-CM | POA: Diagnosis not present

## 2017-02-17 LAB — COMPREHENSIVE METABOLIC PANEL
ALBUMIN: 2.5 g/dL — AB (ref 3.5–5.0)
ALT: 68 U/L — ABNORMAL HIGH (ref 17–63)
ANION GAP: 9 (ref 5–15)
AST: 113 U/L — ABNORMAL HIGH (ref 15–41)
Alkaline Phosphatase: 475 U/L — ABNORMAL HIGH (ref 38–126)
BILIRUBIN TOTAL: 1.5 mg/dL — AB (ref 0.3–1.2)
BUN: 29 mg/dL — ABNORMAL HIGH (ref 6–20)
CHLORIDE: 106 mmol/L (ref 101–111)
CO2: 26 mmol/L (ref 22–32)
Calcium: 8.7 mg/dL — ABNORMAL LOW (ref 8.9–10.3)
Creatinine, Ser: 0.97 mg/dL (ref 0.61–1.24)
GFR calc Af Amer: >60 mL/min (ref >60)
GFR calc non Af Amer: >60 mL/min (ref >60)
GLUCOSE: 85 mg/dL (ref 65–99)
POTASSIUM: 4.1 mmol/L (ref 3.5–5.1)
SODIUM: 141 mmol/L (ref 135–145)
TOTAL PROTEIN: 5.6 g/dL — AB (ref 6.5–8.1)

## 2017-02-17 LAB — CBC WITH DIFFERENTIAL/PLATELET
BASOS ABS: 0.1 10*3/uL (ref 0.0–0.1)
Basophils Relative: 2 %
Eosinophils Absolute: 0.1 10*3/uL (ref 0.0–0.7)
Eosinophils Relative: 1 %
HEMATOCRIT: 41.3 % (ref 39.0–52.0)
HEMOGLOBIN: 13.1 g/dL (ref 13.0–17.0)
LYMPHS ABS: 1.3 10*3/uL (ref 0.7–4.0)
LYMPHS PCT: 16 %
MCH: 29 pg (ref 26.0–34.0)
MCHC: 31.7 g/dL (ref 30.0–36.0)
MCV: 91.6 fL (ref 78.0–100.0)
Monocytes Absolute: 0.8 10*3/uL (ref 0.1–1.0)
Monocytes Relative: 10 %
NEUTROS ABS: 5.7 10*3/uL (ref 1.7–7.7)
Neutrophils Relative %: 71 %
Platelets: 351 10*3/uL (ref 150–400)
RBC: 4.51 MIL/uL (ref 4.22–5.81)
RDW: 25 % — ABNORMAL HIGH (ref 11.5–15.5)
WBC: 8 10*3/uL (ref 4.0–10.5)

## 2017-02-17 LAB — PROTIME-INR
INR: 1.17
Prothrombin Time: 15 seconds (ref 11.4–15.2)

## 2017-02-17 MED ORDER — ALBUMIN HUMAN 25 % IV SOLN: 25.0000 g | Freq: Once | INTRAVENOUS | 0 refills | Status: AC

## 2017-02-17 NOTE — Progress Notes (Signed)
Referring Provider: Petra Kuba, MD Primary Care Physician:  Petra Kuba, MD Primary GI: Dr. Gala Romney   Chief Complaint  Patient presents with  . Edema    HPI:   KIRKLIN MCDUFFEE is a 56 y.o. male presenting today with a history of NASH cirrhosis, ETOH use, treated empirically for AMA negative PBC. Underwent TIPS utilizing a gun-sight technique on 01/12/17. Saw IR on 5/22 and was doing well. Surveillance ultrasound 5/22 negative for evidence of TIPS dysfunction, though does demonstrate pulsatile flow throughout the hepatic venous system, TIPS and IVC, as could be seen in the setting of right-sided heart failure. Unchanged to minimal increase in small amount of intra-abdominal ascites seen on ascites search ultrasound performed 01/20/2017.   Presents today with accumulation of lower extremity edema and  Recurrent ascites. No longer on diuretic therapy (was on 20 mg Lasix and 50 aldactone). Noted earlier this week reaccumulation. Denies shortness of breath, chest pain, DOE. No encephalopathy. Remains on lactulose dosing.   Past Medical History:  Diagnosis Date  . Alcohol use   . Anemia   . Aortic stenosis    mild  . Arthritis   . Atrial flutter ([place])   . Benign hypertrophy of prostate    with urinary retention; repaired hypospadia; transurethral resection of the prostate   . Cardiac conduction disorder    status post pacemaker implantation in 1995 generator replaced 2005  . Chest pain 2007, 2009   cardiac cath > normal coronary arterie; nl left ventricular function  . CHF (congestive heart failure) ([place])   . Cirrhosis ([place]) 11/2016  . Congenital deafness   . COPD (chronic obstructive pulmonary disease) ([place])   . Diabetes mellitus    +Insulin  . Dyspnea   . Edema   . GERD (gastroesophageal reflux disease)    status post multiple dilatations for stricture  . 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  Hepatitis    Thinks it was B  . History of kidney stones   . HTN (hypertension)   . Hyperlipidemia    statin discontinued due to abn LFTs  . Hypothyroidism   . Mitral regurgitation    insignificant  . Obesity   . Obesity 4/08   BMI= 40  . Pelvic mass    identified in 2009, stable 2010  . PONV (postoperative nausea and vomiting)   . Presence of permanent cardiac pacemaker   . Sleep apnea    BiPAP and continuous oxygen  . Syncope   . Thyroid disease     Past Surgical History:  Procedure Laterality Date  . A-V CARDIAC PACEMAKER INSERTION  1995  . COLONOSCOPY  2008  . COLONOSCOPY N/A 06/23/2015   [id number] diverticulosis  . ESOPHAGOGASTRODUODENOSCOPY N/A 06/23/2015   JKK:XFGH portal gastropathy  . IR GENERIC HISTORICAL  12/14/2016   IR PARACENTESIS 12/14/2016 Sandi Mariscal, MD [facility]  . IR GENERIC HISTORICAL  12/14/2016   IR TIPS 12/14/2016 Sandi Mariscal, MD [facility]  . IR PARACENTESIS  01/12/2017  . IR TIPS  01/12/2017  . NASAL SINUS SURGERY     partially removed  . ORCHIECTOMY  1990   bilateral; ?neoplasm  . PACEMAKER INSERTION  05/2004; 10/04/2013   MDT dual chamber pacemaker; gen change 10/04/2013 (MDT ADDRL1)  . PERMANENT PACEMAKER GENERATOR CHANGE N/A 10/04/2013   Procedure: PERMANENT PACEMAKER GENERATOR CHANGE;  Surgeon: Evans Lance, MD;  Location[facility];  Service: Cardiovascular;  Laterality: N/A;  . RADIOLOGY WITH ANESTHESIA N/A 12/14/2016   Procedure: TIPS;  Surgeon: Sandi Mariscal, MD;  Location: [place];  Service: Radiology;  Laterality: N/A;  . RADIOLOGY WITH ANESTHESIA N/A 01/12/2017   Procedure: TIPS;  Surgeon: Sandi Mariscal, MD;  Location: [place];  Service: Radiology;  Laterality: N/A;  . REPAIR HYPOSPADIAS W/ URETHROPLASTY    . TONSILLECTOMY AND ADENOIDECTOMY    . TRANSURETHRAL RESECTION OF PROSTATE     for BPH    Current Outpatient Prescriptions  Medication Sig Dispense Refill  . acetaminophen (TYLENOL) 325 MG tablet Take 650 mg by mouth every 6 (six)  hours as needed for mild pain.    Marland Kitchen albuterol (PROVENTIL HFA;VENTOLIN HFA) 108 (90 Base) MCG/ACT inhaler Inhale 2 puffs into the lungs every 6 (six) hours as needed for wheezing or shortness of breath.    Marland Kitchen albuterol (PROVENTIL) (2.5 MG/3ML) 0.083% nebulizer solution Take 2.5 mg by nebulization every 6 (six) hours as needed for wheezing or shortness of breath.    Marland Kitchen aspirin 325 MG EC tablet Take 325 mg by mouth daily.    . cetirizine (ZYRTEC) 10 MG tablet Take 10 mg by mouth daily.    Marland Kitchen gentamicin ointment (GARAMYCIN) 0.1 % Apply 1 application topically at bedtime. Inside nose     . Glucosamine-Chondroit-Vit C-Mn (GLUCOSAMINE CHONDR 1500 COMPLX PO) Take 1 tablet by mouth every morning.    Marland Kitchen HUMALOG MIX 75/25 KWIKPEN (75-25) 100 UNIT/ML Kwikpen Inject 8 Units into the skin 2 (two) times daily. 15 units in the morning and 15 units in the evening (Patient taking differently: Inject 0-8 Units into the skin 2 (two) times daily. Takes 8 units every morning. Takes a dose in the evening using a sliding scale.) 10 mL 0  . lactulose (CHRONULAC) 10 GM/15ML solution 10cc (2 teaspoons) bid; Titrate to 2-3 semi formed stools a day (Patient taking differently: Take 6.7 g by mouth 2 (two) times daily. 10cc (2 teaspoons) bid; Titrate to 2-3 semi formed stools a day) 473 mL 11  . levothyroxine (LEVOXYL) 50 MCG tablet Take 50 mcg by mouth daily before breakfast.     . modafinil (PROVIGIL) 200 MG tablet Take 200 mg by mouth daily.     . mometasone (NASONEX) 50 MCG/ACT nasal spray Place 2 sprays into the nose daily as needed (FOR CONGESTION/ALLERGIES).    . Multiple Vitamins-Minerals (CENTRUM) tablet Take 1 tablet by mouth daily.      . nitroGLYCERIN (NITROSTAT) 0.4 MG SL tablet Place 1 tablet (0.4 mg total) under the tongue every 5 (five) minutes as needed for chest pain. 25 tablet 3  . NON FORMULARY Bipap 16.0/e    . Omega-3 Fatty Acids (FISH OIL) 1000 MG CAPS Take 1,000 mg by mouth every other day.     Marland Kitchen omeprazole  (PRILOSEC) 40 MG capsule TAKE 1 CAPSULE BY MOUTH ONCE DAILY FOR REFLUX. 30 capsule 0  . ursodiol (ACTIGALL) 500 MG tablet TAKE 1 TABLET BY MOUTH TWICE DAILY 60 tablet 11  . furosemide (LASIX) 20 MG tablet Take 1 tablet (20 mg total) by mouth daily. Take along with spironolactone (Patient not taking: Reported on 02/08/2017) 90 tablet 3  . spironolactone (ALDACTONE) 50 MG tablet TAKE 1 TABLET BY MOUTH ONCE DAILY. (Patient not taking: Reported on 02/08/2017) 30 tablet 1   No current facility-administered medications for this visit.     Allergies as of 02/17/2017 - Review Complete 02/17/2017  Allergen Reaction Noted  . Penicillins Swelling   . Enalapril  Cough 04/15/2011  . Metformin and related Diarrhea 07/30/2013  . Eliquis [apixaban] Other (See Comments) 03/18/2014    Family History  Problem Relation Age of Onset  . COPD Mother   . Hypertension Mother   . Congestive Heart Failure Mother   . Pneumonia Father   . Heart disease Other   . Hypertension Other   . Colon cancer Neg Hx     Social History   Social History  . Marital status: Married    Spouse name: N/A  . Number of children: N/A  . Years of education: N/A   Social History Main Topics  . Smoking status: Never Smoker  . Smokeless tobacco: Never Used     Comment: tobacco use- no   . Alcohol use No     Comment: Hx. of alcohol use  . Drug use: No  . Sexual activity: Yes   Other Topics Concern  . None   Social History Narrative   Part time.     Review of Systems: As mentioned in HPI   Physical Exam: BP 117/79   Pulse 94   Temp 98.1 F (36.7 C) (Oral)   Ht 5\' 2"  (1.575 m)   Wt 167 lb (75.8 kg)   BMI 30.54 kg/m  General:   Alert and oriented. No distress noted. Appears older than stated age.  Head:  Normocephalic and atraumatic. Eyes:  Conjuctiva clear without scleral icterus. Lungs: clear to auscultation bilaterally  Abdomen:  +BS, moderately tense ascites, umbilical hernia easily reducible Msk:   Symmetrical without gross deformities. Normal posture. Extremities:  With 1-2+ edema  Neurologic:  Alert and  oriented x4; no asterixis  Psych:  Alert and cooperative. Normal mood and affect.

## 2017-02-17 NOTE — Patient Instructions (Signed)
Please have blood work done today.   I have ordered a paracentesis for today. I also want to do a special ultrasound to check the TIPS.   If you have shortness of breath, chest pain, go to the emergency room.  After the paracentesis today, start taking the Lasix 20 mg and aldactone 50 mg daily.   I am talking with the Interventional Radiologist as well.

## 2017-02-17 NOTE — Progress Notes (Signed)
cc'ed to pcp °

## 2017-02-17 NOTE — Assessment & Plan Note (Addendum)
56 y.o. male presenting today with a history of NASH cirrhosis, ETOH use, treated empirically for AMA negative PBC. Underwent TIPS utilizing a gun-sight technique on 01/12/17. Saw IR on 5/22 and was doing well at that time with ultrasound negative for any evidence of TIPS dysfunction. Had been on very low dose diuretics and difficult to manage prior to TIPS due to fluctuating renal function and now with recurrent ascites and lower extremity edema. Will need to have some component of diuretic therapy for the long-term at a low dose as tolerated by patient to avoid recurrent edema/ascites hopefully; discussed with both Jannifer Franklin, PA, and Dr. Pascal Lux who performed the TIPS procedure. Paracentesis today ordered with albumin, duplex ultrasound to assess TIPS today, stat CBC, CMP, INR today. Will take Lasix 20 mg and aldactone 50 mg daily (which is the dose he has been able to tolerate previously), with close monitoring and titration as needed. Remains without evidence of encephalopathy. No chest pain, DOE, and appears at baseline from a cardiopulmonary status. Continue lactulose dosing. If evidence of TIPS dysfunction, will need referral back to IR to be assessed in a timely manner. This was discussed with Dr. Pascal Lux via phone today.

## 2017-02-17 NOTE — Telephone Encounter (Signed)
Pt seen in the office

## 2017-02-18 ENCOUNTER — Telehealth: Payer: Self-pay | Admitting: Gastroenterology

## 2017-02-18 ENCOUNTER — Encounter: Payer: Self-pay | Admitting: Gastroenterology

## 2017-02-18 NOTE — Telephone Encounter (Signed)
APPT MADE AND LETTER SENT  °

## 2017-02-18 NOTE — Telephone Encounter (Signed)
Please have patient return in 4 weeks. May use urgent if needed.

## 2017-02-24 ENCOUNTER — Encounter: Payer: Medicare Other | Admitting: *Deleted

## 2017-02-25 ENCOUNTER — Encounter: Payer: Self-pay | Admitting: Cardiology

## 2017-03-01 ENCOUNTER — Ambulatory Visit (INDEPENDENT_AMBULATORY_CARE_PROVIDER_SITE_OTHER): Payer: Medicare Other | Admitting: *Deleted

## 2017-03-01 DIAGNOSIS — I495 Sick sinus syndrome: Secondary | ICD-10-CM | POA: Diagnosis not present

## 2017-03-02 ENCOUNTER — Encounter: Payer: Self-pay | Admitting: Cardiology

## 2017-03-02 NOTE — Progress Notes (Signed)
Remote pacemaker transmission.   

## 2017-03-04 ENCOUNTER — Encounter: Payer: Self-pay | Admitting: Interventional Radiology

## 2017-03-07 ENCOUNTER — Encounter: Payer: Self-pay | Admitting: Interventional Radiology

## 2017-03-08 LAB — CUP PACEART REMOTE DEVICE CHECK
Battery Impedance: 159 Ohm
Battery Remaining Longevity: 135 mo
Brady Statistic AP VS Percent: 0 %
Date Time Interrogation Session: 20180612182928
Implantable Lead Implant Date: 20050923
Implantable Lead Location: 753859
Implantable Lead Location: 753860
Implantable Lead Model: 4524
Lead Channel Impedance Value: 233 Ohm
Lead Channel Setting Pacing Amplitude: 2.5 V
MDC IDC LEAD IMPLANT DT: 20050923
MDC IDC MSMT BATTERY VOLTAGE: 2.79 V
MDC IDC MSMT LEADCHNL RV IMPEDANCE VALUE: 306 Ohm
MDC IDC MSMT LEADCHNL RV PACING THRESHOLD AMPLITUDE: 0.5 V
MDC IDC MSMT LEADCHNL RV PACING THRESHOLD PULSEWIDTH: 0.4 ms
MDC IDC PG IMPLANT DT: 20150115
MDC IDC SET LEADCHNL RV PACING AMPLITUDE: 2 V
MDC IDC SET LEADCHNL RV PACING PULSEWIDTH: 0.4 ms
MDC IDC SET LEADCHNL RV SENSING SENSITIVITY: 2 mV
MDC IDC STAT BRADY AP VP PERCENT: 0 %
MDC IDC STAT BRADY AS VP PERCENT: 0 %
MDC IDC STAT BRADY AS VS PERCENT: 100 %

## 2017-03-21 ENCOUNTER — Ambulatory Visit: Payer: Medicare Other | Admitting: Gastroenterology

## 2017-03-22 ENCOUNTER — Encounter: Payer: Self-pay | Admitting: Gastroenterology

## 2017-03-22 ENCOUNTER — Ambulatory Visit (INDEPENDENT_AMBULATORY_CARE_PROVIDER_SITE_OTHER): Payer: Medicare Other | Admitting: Gastroenterology

## 2017-03-22 VITALS — BP 97/74 | HR 89 | Temp 95.9°F | Ht 62.0 in | Wt 168.8 lb

## 2017-03-22 DIAGNOSIS — K7581 Nonalcoholic steatohepatitis (NASH): Secondary | ICD-10-CM | POA: Diagnosis not present

## 2017-03-22 DIAGNOSIS — K7460 Unspecified cirrhosis of liver: Secondary | ICD-10-CM | POA: Diagnosis not present

## 2017-03-22 MED ORDER — FUROSEMIDE 20 MG PO TABS: 20.0000 mg | ORAL_TABLET | Freq: Every day | ORAL | 3 refills | Status: DC

## 2017-03-22 MED ORDER — SPIRONOLACTONE 50 MG PO TABS: 50.0000 mg | ORAL_TABLET | Freq: Every day | ORAL | 3 refills | Status: DC

## 2017-03-22 NOTE — Patient Instructions (Signed)
Continue the lasix 20 mg and the aldactone 50 mg every day.   Continue the lactulose so that you have 2-3 soft bowel movements a day.   Start taking caltrate (with Vit D) or oscal every day.   We will see you in 2 months.

## 2017-03-22 NOTE — Progress Notes (Signed)
Referring Provider: Petra Kuba, MD Primary Care Physician:  Petra Kuba, MD Primary GI: Dr. Gala Romney   Chief Complaint  Patient presents with  . Ascites    f/u    HPI:   [name] is a 56 y.o. male presenting today with a history of NASH cirrhosis, ETOH use, treated empirically for AMA negative PBC. Underwent TIPS utilizing a gun-sight technique on 01/12/17. Saw IR on 5/22 and was doing well. Surveillance ultrasound 5/22 negative for evidence of TIPS dysfunction,thoughdoes demonstrate pulsatile flow throughout the hepatic venous system, TIPS and IVC, as could be seen in the setting of right-sided heart failure. Unchanged to minimal increase in small amount of intra-abdominal ascites seen on ascites search ultrasound performed 01/20/2017. He had been off of diuretics and had recurrent lower extremity edema and ascites. Restarted lasix 20 mg and aldactone 50 mg daily. TIPS patent 5/31. No need for paracentesis. Due to see IR again around November or so.   Recent labs from PCP with slight leukocytosis at 13 in setting of cellulitis of right lower extremity. BUN 24, Cr 1.14 GFR 71. Taking lactulose twice a day and having BM 2-3 times per day. Denies SOB. Denies abdominal distension. Fluid much improved. No lower extremity edema. No abdominal pain. No confusion.   Past Medical History:  Diagnosis Date  . Alcohol use   . Anemia   . Aortic stenosis    mild  . Arthritis   . Atrial flutter ([place])   . Benign hypertrophy of prostate    with urinary retention; repaired hypospadia; transurethral resection of the prostate   . Cardiac conduction disorder    status post pacemaker implantation in 1995 generator replaced 2005  . Chest pain 2007, 2009   cardiac cath > normal coronary arterie; nl left ventricular function  . CHF (congestive heart failure) ([place])   . Cirrhosis ([place]) 11/2016  . Congenital deafness   . COPD (chronic obstructive pulmonary disease) ([place])   . Diabetes  mellitus    +Insulin  . Dyspnea   . Edema   . GERD (gastroesophageal reflux disease)    status post multiple dilatations for stricture  . 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 Hepatitis    Thinks it was B  . History of kidney stones   . HTN (hypertension)   . Hyperlipidemia    statin discontinued due to abn LFTs  . Hypothyroidism   . Mitral regurgitation    insignificant  . Obesity   . Obesity 4/08   BMI= 40  . Pelvic mass    identified in 2009, stable 2010  . PONV (postoperative nausea and vomiting)   . Presence of permanent cardiac pacemaker   . Sleep apnea    BiPAP and continuous oxygen  . Syncope   . Thyroid disease     Past Surgical History:  Procedure Laterality Date  . A-V CARDIAC PACEMAKER INSERTION  1995  . COLONOSCOPY  2008  . COLONOSCOPY N/A 06/23/2015   [id number] diverticulosis  . ESOPHAGOGASTRODUODENOSCOPY N/A 06/23/2015   YPP:JKDT portal gastropathy  . IR GENERIC HISTORICAL  12/14/2016   IR PARACENTESIS 12/14/2016 Sandi Mariscal, MD [facility]  . IR GENERIC HISTORICAL  12/14/2016   IR TIPS 12/14/2016 Sandi Mariscal, MD [facility]  . IR PARACENTESIS  01/12/2017  . IR RADIOLOGIST EVAL & MGMT  01/06/2017  . IR RADIOLOGIST EVAL & MGMT  02/08/2017  . IR RADIOLOGIST EVAL &  MGMT  11/30/2016  . IR TIPS  01/12/2017  . NASAL SINUS SURGERY     partially removed  . ORCHIECTOMY  1990   bilateral; ?neoplasm  . PACEMAKER INSERTION  05/2004; 10/04/2013   MDT dual chamber pacemaker; gen change 10/04/2013 (MDT ADDRL1)  . PERMANENT PACEMAKER GENERATOR CHANGE N/A 10/04/2013   Procedure: PERMANENT PACEMAKER GENERATOR CHANGE;  Surgeon: Evans Lance, MD;  Location: [facility] CATH LAB;  Service: Cardiovascular;  Laterality: N/A;  . RADIOLOGY WITH ANESTHESIA N/A 12/14/2016   Procedure: TIPS;  Surgeon: Sandi Mariscal, MD;  Location: [place];  Service: Radiology;  Laterality: N/A;  . RADIOLOGY WITH ANESTHESIA N/A 01/12/2017   Procedure:  TIPS;  Surgeon: Sandi Mariscal, MD;  Location: [place];  Service: Radiology;  Laterality: N/A;  . REPAIR HYPOSPADIAS W/ URETHROPLASTY    . TONSILLECTOMY AND ADENOIDECTOMY    . TRANSURETHRAL RESECTION OF PROSTATE     for BPH    Current Outpatient Prescriptions  Medication Sig Dispense Refill  . acetaminophen (TYLENOL) 325 MG tablet Take 650 mg by mouth every 6 (six) hours as needed for mild pain.    Marland Kitchen albuterol (PROVENTIL HFA;VENTOLIN HFA) 108 (90 Base) MCG/ACT inhaler Inhale 2 puffs into the lungs every 6 (six) hours as needed for wheezing or shortness of breath.    Marland Kitchen albuterol (PROVENTIL) (2.5 MG/3ML) 0.083% nebulizer solution Take 2.5 mg by nebulization every 6 (six) hours as needed for wheezing or shortness of breath.    Marland Kitchen aspirin 325 MG EC tablet Take 325 mg by mouth daily.    . cetirizine (ZYRTEC) 10 MG tablet Take 10 mg by mouth daily.    . furosemide (LASIX) 20 MG tablet Take 20 mg by mouth daily.    Marland Kitchen gentamicin ointment (GARAMYCIN) 0.1 % Apply 1 application topically at bedtime. Inside nose     . Glucosamine-Chondroit-Vit C-Mn (GLUCOSAMINE CHONDR 1500 COMPLX PO) Take 1 tablet by mouth every morning.    Marland Kitchen HUMALOG MIX 75/25 KWIKPEN (75-25) 100 UNIT/ML Kwikpen Inject 8 Units into the skin 2 (two) times daily. 15 units in the morning and 15 units in the evening (Patient taking differently: Inject 0-8 Units into the skin 2 (two) times daily. Takes 8 units every morning. Takes a dose in the evening using a sliding scale.) 10 mL 0  . lactulose (CHRONULAC) 10 GM/15ML solution 10cc (2 teaspoons) bid; Titrate to 2-3 semi formed stools a day (Patient taking differently: Take 6.7 g by mouth 2 (two) times daily. 10cc (2 teaspoons) bid; Titrate to 2-3 semi formed stools a day) 473 mL 11  . levothyroxine (LEVOXYL) 50 MCG tablet Take 50 mcg by mouth daily before breakfast.     . metoprolol succinate (TOPROL-XL) 25 MG 24 hr tablet Take 25 mg by mouth daily.    . modafinil (PROVIGIL) 200 MG tablet Take 200 mg  by mouth daily.     . mometasone (NASONEX) 50 MCG/ACT nasal spray Place 2 sprays into the nose daily as needed (FOR CONGESTION/ALLERGIES).    . Multiple Vitamins-Minerals (CENTRUM) tablet Take 1 tablet by mouth daily.      . nitroGLYCERIN (NITROSTAT) 0.4 MG SL tablet Place 1 tablet (0.4 mg total) under the tongue every 5 (five) minutes as needed for chest pain. 25 tablet 3  . NON FORMULARY Bipap 16.0/e    . Omega-3 Fatty Acids (FISH OIL) 1000 MG CAPS Take 1,000 mg by mouth every other day.     Marland Kitchen omeprazole (PRILOSEC) 40 MG capsule TAKE 1 CAPSULE BY  MOUTH ONCE DAILY FOR REFLUX. 30 capsule 0  . spironolactone (ALDACTONE) 50 MG tablet Take 50 mg by mouth daily.    . ursodiol (ACTIGALL) 500 MG tablet TAKE 1 TABLET BY MOUTH TWICE DAILY 60 tablet 11   No current facility-administered medications for this visit.     Allergies as of 03/22/2017 - Review Complete 03/22/2017  Allergen Reaction Noted  . Penicillins Swelling   . Enalapril Cough 04/15/2011  . Metformin and related Diarrhea 07/30/2013  . Eliquis [apixaban] Other (See Comments) 03/18/2014    Family History  Problem Relation Age of Onset  . COPD Mother   . Hypertension Mother   . Congestive Heart Failure Mother   . Pneumonia Father   . Heart disease Other   . Hypertension Other   . Colon cancer Neg Hx     Social History   Social History  . Marital status: Married    Spouse name: N/A  . Number of children: N/A  . Years of education: N/A   Social History Main Topics  . Smoking status: Never Smoker  . Smokeless tobacco: Never Used     Comment: tobacco use- no   . Alcohol use No     Comment: Hx. of alcohol use  . Drug use: No  . Sexual activity: Yes   Other Topics Concern  . None   Social History Narrative   Part time.     Review of Systems: As mentioned in HPI   Physical Exam: BP 97/74   Pulse 89   Temp (!) 95.9 F (35.5 C) (Axillary)   Ht 5\' 2"  (1.575 m)   Wt 168 lb 12.8 oz (76.6 kg)   BMI 30.87 kg/m    General:   Alert and oriented. No distress noted. Pleasant and cooperative.  Head:  Normocephalic and atraumatic. Eyes:  Conjuctiva clear without scleral icterus. Mouth:  Oral mucosa pink and moist. Good dentition. No lesions. Abdomen:  +BS, distended but soft, protruding umbilical hernia,  Extremities:  No lower extremity edema  Neurologic:  Alert and  oriented x4;  grossly normal neurologically. Psych:  Alert and cooperative. Normal mood and affect.

## 2017-03-24 NOTE — Progress Notes (Signed)
cc'ed to pcp °

## 2017-03-24 NOTE — Assessment & Plan Note (Signed)
56 year old male with history of NASH cirrhosis, history ETOH, treated empirically for AMA negative PBC and undergoing TIPS utilizing a gun-sight technique 01/12/17. Doing well on Lasix 20 mg and aldactone 50 mg daily. Continues with lactulose dosing. No evidence of encephalopathy or need for paracentesis since TIPS. Continue MVI and add calcium with D. Return in 2 months and will pursue labs at that time.

## 2017-04-04 ENCOUNTER — Ambulatory Visit (INDEPENDENT_AMBULATORY_CARE_PROVIDER_SITE_OTHER): Payer: Medicare Other | Admitting: Cardiology

## 2017-04-04 ENCOUNTER — Encounter: Payer: Self-pay | Admitting: Cardiology

## 2017-04-04 VITALS — BP 102/66 | HR 88 | Ht 63.0 in | Wt 168.0 lb

## 2017-04-04 DIAGNOSIS — I350 Nonrheumatic aortic (valve) stenosis: Secondary | ICD-10-CM | POA: Diagnosis not present

## 2017-04-04 DIAGNOSIS — I4892 Unspecified atrial flutter: Secondary | ICD-10-CM

## 2017-04-04 DIAGNOSIS — I495 Sick sinus syndrome: Secondary | ICD-10-CM | POA: Diagnosis not present

## 2017-04-04 DIAGNOSIS — I10 Essential (primary) hypertension: Secondary | ICD-10-CM

## 2017-04-04 NOTE — Progress Notes (Signed)
Clinical Summary [name] is a 56 y.o.male seen today for follow up of the following medical problems.   1. Atrial flutter  - denies any palpitations, he is on metoprolol for rate control - long term history of nose bleeds. We tried several anticoag agents, however all severely increased his nose bleeds requiring ER visits and nose packing. He states he has seen ENT before and told he has a bleeding vessel that there is nothing can be done for.  - still with occasional nose bleeds  -  Has not been on anticoag due to severe nose bleeds in the past. Now with severe cirrhosis as well - no palpitations since last visit   2. Aortic stenosis - moderate to severe AS by last echo.  -- he has not had any chest pain, SOB, lightheadedness, or syncope   3. Tachy-Brady syndrme - no recent symptoms  4. HTN - he is compliant with meds   5. DM - followed by pcp  6. Cirrhosis - recent TIPS procedure, followed by GI. 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   7. COPD -followed by pcp, has been on home O2  8 . Chronic diastolic HF - occasional LE edema, overall controlled.      Past Medical History:  Diagnosis Date  . Alcohol use   . Anemia   . Aortic stenosis    mild  . Arthritis   . Atrial flutter ([place])   . Benign hypertrophy of prostate    with urinary retention; repaired hypospadia; transurethral resection of the prostate   . Cardiac conduction disorder    status post pacemaker implantation in 1995 generator replaced 2005  . Chest pain 2007, 2009   cardiac cath > normal coronary arterie; nl left ventricular function  . CHF (congestive heart failure) ([place])   . Cirrhosis ([place]) 11/2016  . Congenital deafness   . COPD (chronic obstructive pulmonary disease) ([place])   . Diabetes mellitus    +Insulin  . Dyspnea   . Edema   . GERD (gastroesophageal reflux disease)    status post multiple dilatations for stricture  . Hepatic disease    NASH versus chronic  active hepatitis aw cirrhosis; inconclusive biospy in 1/10  . Hepatitis    Thinks it was B  . History of kidney stones   . HTN (hypertension)   . Hyperlipidemia    statin discontinued due to abn LFTs  . Hypothyroidism   . Mitral regurgitation    insignificant  . Obesity   . Obesity 4/08   BMI= 40  . Pelvic mass    identified in 2009, stable 2010  . PONV (postoperative nausea and vomiting)   . Presence of permanent cardiac pacemaker   . Sleep apnea    BiPAP and continuous oxygen  . Syncope   . Thyroid disease      Allergies  Allergen Reactions  . Penicillins Swelling    SWELLING OF AIRWAY  PATIENT HAD A PCN REACTION WITH IMMEDIATE RASH, FACIAL/TONGUE/THROAT SWELLING, SOB, OR LIGHTHEADEDNESS WITH HYPOTENSION:  #  #  #  YES  #  #  #  Has patient had a PCN reaction causing severe rash involving mucus membranes or skin necrosis: No Has patient had a PCN reaction that required hospitalization No Has patient had a PCN reaction occurring within the last 10 years: No  . Enalapril Cough  . Metformin And Related Diarrhea  . Eliquis [Apixaban] Other (See Comments)    DOSE RELATED BLEEDING  Current Outpatient Prescriptions  Medication Sig Dispense Refill  . acetaminophen (TYLENOL) 325 MG tablet Take 650 mg by mouth every 6 (six) hours as needed for mild pain.    Marland Kitchen albuterol (PROVENTIL HFA;VENTOLIN HFA) 108 (90 Base) MCG/ACT inhaler Inhale 2 puffs into the lungs every 6 (six) hours as needed for wheezing or shortness of breath.    Marland Kitchen albuterol (PROVENTIL) (2.5 MG/3ML) 0.083% nebulizer solution Take 2.5 mg by nebulization every 6 (six) hours as needed for wheezing or shortness of breath.    Marland Kitchen aspirin 325 MG EC tablet Take 325 mg by mouth daily.    . cetirizine (ZYRTEC) 10 MG tablet Take 10 mg by mouth daily.    . furosemide (LASIX) 20 MG tablet Take 1 tablet (20 mg total) by mouth daily. 90 tablet 3  . gentamicin ointment (GARAMYCIN) 0.1 % Apply 1 application topically at bedtime.  Inside nose     . Glucosamine-Chondroit-Vit C-Mn (GLUCOSAMINE CHONDR 1500 COMPLX PO) Take 1 tablet by mouth every morning.    Marland Kitchen HUMALOG MIX 75/25 KWIKPEN (75-25) 100 UNIT/ML Kwikpen Inject 8 Units into the skin 2 (two) times daily. 15 units in the morning and 15 units in the evening (Patient taking differently: Inject 0-8 Units into the skin 2 (two) times daily. Takes 8 units every morning. Takes a dose in the evening using a sliding scale.) 10 mL 0  . lactulose (CHRONULAC) 10 GM/15ML solution 10cc (2 teaspoons) bid; Titrate to 2-3 semi formed stools a day (Patient taking differently: Take 6.7 g by mouth 2 (two) times daily. 10cc (2 teaspoons) bid; Titrate to 2-3 semi formed stools a day) 473 mL 11  . levothyroxine (LEVOXYL) 50 MCG tablet Take 50 mcg by mouth daily before breakfast.     . metoprolol succinate (TOPROL-XL) 25 MG 24 hr tablet Take 25 mg by mouth daily.    . modafinil (PROVIGIL) 200 MG tablet Take 200 mg by mouth daily.     . mometasone (NASONEX) 50 MCG/ACT nasal spray Place 2 sprays into the nose daily as needed (FOR CONGESTION/ALLERGIES).    . Multiple Vitamins-Minerals (CENTRUM) tablet Take 1 tablet by mouth daily.      . nitroGLYCERIN (NITROSTAT) 0.4 MG SL tablet Place 1 tablet (0.4 mg total) under the tongue every 5 (five) minutes as needed for chest pain. 25 tablet 3  . NON FORMULARY Bipap 16.0/e    . Omega-3 Fatty Acids (FISH OIL) 1000 MG CAPS Take 1,000 mg by mouth every other day.     Marland Kitchen omeprazole (PRILOSEC) 40 MG capsule TAKE 1 CAPSULE BY MOUTH ONCE DAILY FOR REFLUX. 30 capsule 0  . spironolactone (ALDACTONE) 50 MG tablet Take 1 tablet (50 mg total) by mouth daily. 90 tablet 3  . ursodiol (ACTIGALL) 500 MG tablet TAKE 1 TABLET BY MOUTH TWICE DAILY 60 tablet 11   No current facility-administered medications for this visit.      Past Surgical History:  Procedure Laterality Date  . A-V CARDIAC PACEMAKER INSERTION  1995  . COLONOSCOPY  2008  . COLONOSCOPY N/A 06/23/2015    [id number]:[id number] diverticulosis  . ESOPHAGOGASTRODUODENOSCOPY N/A 06/23/2015   TOI:ZTIW portal gastropathy  . IR GENERIC HISTORICAL  12/14/2016   IR PARACENTESIS 12/14/2016 Nicholas Mariscal, MD [facility]  . IR GENERIC HISTORICAL  12/14/2016   IR TIPS 12/14/2016 Nicholas Mariscal, MD [facility]  . IR PARACENTESIS  01/12/2017  . IR RADIOLOGIST EVAL & MGMT  01/06/2017  . IR RADIOLOGIST EVAL & MGMT  02/08/2017  .  IR RADIOLOGIST EVAL & MGMT  11/30/2016  . IR TIPS  01/12/2017  . NASAL SINUS SURGERY     partially removed  . ORCHIECTOMY  1990   bilateral; ?neoplasm  . PACEMAKER INSERTION  05/2004; 10/04/2013   MDT dual chamber pacemaker; gen change 10/04/2013 (MDT ADDRL1)  . PERMANENT PACEMAKER GENERATOR CHANGE N/A 10/04/2013   Procedure: PERMANENT PACEMAKER GENERATOR CHANGE;  Surgeon: Evans Lance, MD;  Location: [facility] CATH LAB;  Service: Cardiovascular;  Laterality: N/A;  . RADIOLOGY WITH ANESTHESIA N/A 12/14/2016   Procedure: TIPS;  Surgeon: Nicholas Mariscal, MD;  Location: [facility];  Service: Radiology;  Laterality: N/A;  . RADIOLOGY WITH ANESTHESIA N/A 01/12/2017   Procedure: TIPS;  Surgeon: Nicholas Mariscal, MD;  Location: [place] [facility];  Service: Radiology;  Laterality: N/A;  . REPAIR HYPOSPADIAS W/ URETHROPLASTY    . TONSILLECTOMY AND ADENOIDECTOMY    . TRANSURETHRAL RESECTION OF PROSTATE     for BPH     Allergies  Allergen Reactions  . Penicillins Swelling    SWELLING OF AIRWAY  PATIENT HAD A PCN REACTION WITH IMMEDIATE RASH, FACIAL/TONGUE/THROAT SWELLING, SOB, OR LIGHTHEADEDNESS WITH HYPOTENSION:  #  #  #  YES  #  #  #  Has patient had a PCN reaction causing severe rash involving mucus membranes or skin necrosis: No Has patient had a PCN reaction that required hospitalization No Has patient had a PCN reaction occurring within the last 10 years: No  . Enalapril Cough  . Metformin And Related Diarrhea  . Eliquis [Apixaban] Other (See Comments)    DOSE RELATED BLEEDING      Family History  Problem Relation Age of Onset   . COPD Mother   . Hypertension Mother   . Congestive Heart Failure Mother   . Pneumonia Father   . Heart disease Other   . Hypertension Other   . Colon cancer Neg Hx      Social History Mr. [place] reports that he has never smoked. He has never used smokeless tobacco. Mr. Iwan reports that he does not drink alcohol.   Review of Systems CONSTITUTIONAL: No weight loss, fever, chills, weakness or fatigue.  HEENT: Eyes: No visual loss, blurred vision, double vision or yellow sclerae.No hearing loss, sneezing, congestion, runny nose or sore throat.  SKIN: No rash or itching.  CARDIOVASCULAR: per hpi RESPIRATORY: No shortness of breath, cough or sputum.  GASTROINTESTINAL: No anorexia, nausea, vomiting or diarrhea. No abdominal pain or blood.  GENITOURINARY: No burning on urination, no polyuria NEUROLOGICAL: No headache, dizziness, syncope, paralysis, ataxia, numbness or tingling in the extremities. No change in bowel or bladder control.  MUSCULOSKELETAL: No muscle, back pain, joint pain or stiffness.  LYMPHATICS: No enlarged nodes. No history of splenectomy.  PSYCHIATRIC: No history of depression or anxiety.  ENDOCRINOLOGIC: No reports of sweating, cold or heat intolerance. No polyuria or polydipsia.  Marland Kitchen   Physical Examination Vitals:   04/04/17 1203  BP: 102/66  Pulse: 88   Vitals:   04/04/17 1203  Weight: 168 lb (76.2 kg)  Height: 5\' 3"  (1.6 [name])    Gen: resting comfortably, no acute distress HEENT: no scleral icterus, pupils equal round and reactive, no palptable cervical adenopathy,  CV: irreg, 3/6 systolic murmur rusb Resp: Clear to auscultation bilaterally GI: abdomen is soft, non-tender, non-distended, normal bowel sounds, no hepatosplenomegaly MSK: extremities are warm, no edema.  Skin: warm, no rash Neuro:  no focal deficits Psych: appropriate affect   Diagnostic Studies 02/2013 Echo limited evaluate  AS: mild AS (mean grad 16   01/2013 Echo: difficult  study, LV function mildly reduced ( no percentage given), possible moderate to severe AS (AVA .53) mean grad 27 mmHg.   07/30/13 Clinic EKG: sinus rhythm, 1st degree AV block, RBBB   Jan 2015 Echo LVEF 55-60%, cannot eval diastolic function, mod AS (area 1, mean grad 20).    02/2014 Carotid US IMPRESSION: No evidence of internal carotid artery stenosis.  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    Assessment and Plan  1. Atrial flutter  -no symptoms,  continue rate control  - Severe nose bleeds on several anticoag agents, now with severe cirrhosis. He is not on anticoag at this time.   2. Aortic stenosis - moderate to severe by last echo - no symptoms, continue to monitor.  3. Tachy-Brady syndrome - no current symptoms, continue to follow in device clinic for his pacemaker   4. HTN - bp is at goal, he will continue current meds      Arnoldo Lenis, [name].D

## 2017-04-04 NOTE — Patient Instructions (Signed)
Your physician recommends that you schedule a follow-up appointment in: 4 months with Dr Harl Bowie    Your physician recommends that you continue on your current medications as directed. Please refer to the Current Medication list given to you today.      No lab work or tests ordered today.       Thank you for choosing [facility] !

## 2017-04-11 ENCOUNTER — Other Ambulatory Visit: Payer: Self-pay | Admitting: Cardiology

## 2017-04-13 ENCOUNTER — Telehealth: Payer: Self-pay | Admitting: Internal Medicine

## 2017-04-13 NOTE — Telephone Encounter (Signed)
Patient wife called and stated he had abd pain yesterday but he seems to be ok today, home nurse wanted them to call and find out if they needed to do anything today

## 2017-04-13 NOTE — Telephone Encounter (Signed)
Spoke with [name], she said [name] wasn't feeling good yesterday, he said his stomach hurt and he stayed in the bed most of the day. She got him to eat a little bit around 3pm, but he wouldn't eat much. No fever. Today, he has been doing better, he went to class today and has been eating. No c/o pain. No fever. I advised her to keep an eye on him and let us know if anything changes. And if AB has any further recommendations, I would call her back. [name] verbalized understanding.

## 2017-04-13 NOTE — Telephone Encounter (Signed)
Yes. Make sure he is having 3 soft bowel movements a day. Make sure taking the lactulose and Xifaxan. Monitor for mental status changes, confusion, fever, abdominal distension. Hopefully it was just a benign thing and unrelated to chronic liver disease or decompensation.

## 2017-04-14 NOTE — Telephone Encounter (Signed)
Pam is aware

## 2017-04-15 ENCOUNTER — Emergency Department (HOSPITAL_COMMUNITY): Payer: Medicare Other

## 2017-04-15 ENCOUNTER — Encounter (HOSPITAL_COMMUNITY): Payer: Self-pay | Admitting: Emergency Medicine

## 2017-04-15 ENCOUNTER — Inpatient Hospital Stay (HOSPITAL_COMMUNITY)
Admission: EM | Admit: 2017-04-15 | Discharge: 2017-04-21 | DRG: 442 | Disposition: A | Discharge status: Home Health | Payer: Medicare Other | Attending: Internal Medicine | Admitting: Internal Medicine

## 2017-04-15 DIAGNOSIS — G4733 Obstructive sleep apnea (adult) (pediatric): Secondary | ICD-10-CM | POA: Diagnosis present

## 2017-04-15 DIAGNOSIS — Z8249 Family history of ischemic heart disease and other diseases of the circulatory system: Secondary | ICD-10-CM | POA: Diagnosis not present

## 2017-04-15 DIAGNOSIS — I350 Nonrheumatic aortic (valve) stenosis: Secondary | ICD-10-CM | POA: Diagnosis present

## 2017-04-15 DIAGNOSIS — I110 Hypertensive heart disease with heart failure: Secondary | ICD-10-CM | POA: Diagnosis present

## 2017-04-15 DIAGNOSIS — I4892 Unspecified atrial flutter: Secondary | ICD-10-CM | POA: Diagnosis present

## 2017-04-15 DIAGNOSIS — K7290 Hepatic failure, unspecified without coma: Secondary | ICD-10-CM | POA: Diagnosis present

## 2017-04-15 DIAGNOSIS — E119 Type 2 diabetes mellitus without complications: Secondary | ICD-10-CM

## 2017-04-15 DIAGNOSIS — K7200 Acute and subacute hepatic failure without coma: Secondary | ICD-10-CM | POA: Diagnosis present

## 2017-04-15 DIAGNOSIS — Z9119 Patient's noncompliance with other medical treatment and regimen: Secondary | ICD-10-CM | POA: Diagnosis not present

## 2017-04-15 DIAGNOSIS — E039 Hypothyroidism, unspecified: Secondary | ICD-10-CM | POA: Diagnosis present

## 2017-04-15 DIAGNOSIS — I481 Persistent atrial fibrillation: Secondary | ICD-10-CM

## 2017-04-15 DIAGNOSIS — J449 Chronic obstructive pulmonary disease, unspecified: Secondary | ICD-10-CM | POA: Diagnosis present

## 2017-04-15 DIAGNOSIS — K7682 Hepatic encephalopathy: Secondary | ICD-10-CM

## 2017-04-15 DIAGNOSIS — K7581 Nonalcoholic steatohepatitis (NASH): Secondary | ICD-10-CM | POA: Diagnosis present

## 2017-04-15 DIAGNOSIS — W19XXXA Unspecified fall, initial encounter: Secondary | ICD-10-CM | POA: Diagnosis present

## 2017-04-15 DIAGNOSIS — F1010 Alcohol abuse, uncomplicated: Secondary | ICD-10-CM | POA: Diagnosis present

## 2017-04-15 DIAGNOSIS — Z950 Presence of cardiac pacemaker: Secondary | ICD-10-CM

## 2017-04-15 DIAGNOSIS — R7989 Other specified abnormal findings of blood chemistry: Secondary | ICD-10-CM | POA: Diagnosis present

## 2017-04-15 DIAGNOSIS — Z888 Allergy status to other drugs, medicaments and biological substances status: Secondary | ICD-10-CM

## 2017-04-15 DIAGNOSIS — Z880 Allergy status to penicillin: Secondary | ICD-10-CM

## 2017-04-15 DIAGNOSIS — Z7982 Long term (current) use of aspirin: Secondary | ICD-10-CM | POA: Diagnosis not present

## 2017-04-15 DIAGNOSIS — K7460 Unspecified cirrhosis of liver: Secondary | ICD-10-CM | POA: Diagnosis present

## 2017-04-15 DIAGNOSIS — Z79899 Other long term (current) drug therapy: Secondary | ICD-10-CM

## 2017-04-15 DIAGNOSIS — R7401 Elevation of levels of liver transaminase levels: Secondary | ICD-10-CM

## 2017-04-15 DIAGNOSIS — R278 Other lack of coordination: Secondary | ICD-10-CM | POA: Diagnosis present

## 2017-04-15 DIAGNOSIS — R471 Dysarthria and anarthria: Secondary | ICD-10-CM | POA: Diagnosis present

## 2017-04-15 DIAGNOSIS — G9341 Metabolic encephalopathy: Secondary | ICD-10-CM | POA: Diagnosis not present

## 2017-04-15 DIAGNOSIS — G4730 Sleep apnea, unspecified: Secondary | ICD-10-CM | POA: Diagnosis present

## 2017-04-15 DIAGNOSIS — I4891 Unspecified atrial fibrillation: Secondary | ICD-10-CM | POA: Diagnosis present

## 2017-04-15 DIAGNOSIS — I10 Essential (primary) hypertension: Secondary | ICD-10-CM | POA: Diagnosis present

## 2017-04-15 DIAGNOSIS — I482 Chronic atrial fibrillation: Secondary | ICD-10-CM | POA: Diagnosis present

## 2017-04-15 DIAGNOSIS — I509 Heart failure, unspecified: Secondary | ICD-10-CM | POA: Diagnosis present

## 2017-04-15 DIAGNOSIS — K219 Gastro-esophageal reflux disease without esophagitis: Secondary | ICD-10-CM | POA: Diagnosis present

## 2017-04-15 DIAGNOSIS — Z9114 Patient's other noncompliance with medication regimen: Secondary | ICD-10-CM | POA: Diagnosis not present

## 2017-04-15 DIAGNOSIS — Z23 Encounter for immunization: Secondary | ICD-10-CM | POA: Diagnosis present

## 2017-04-15 DIAGNOSIS — Z794 Long term (current) use of insulin: Secondary | ICD-10-CM | POA: Diagnosis not present

## 2017-04-15 DIAGNOSIS — R296 Repeated falls: Secondary | ICD-10-CM | POA: Diagnosis present

## 2017-04-15 DIAGNOSIS — R740 Nonspecific elevation of levels of transaminase and lactic acid dehydrogenase [LDH]: Secondary | ICD-10-CM | POA: Diagnosis not present

## 2017-04-15 DIAGNOSIS — K7469 Other cirrhosis of liver: Secondary | ICD-10-CM | POA: Diagnosis not present

## 2017-04-15 DIAGNOSIS — K7031 Alcoholic cirrhosis of liver with ascites: Secondary | ICD-10-CM

## 2017-04-15 DIAGNOSIS — R945 Abnormal results of liver function studies: Secondary | ICD-10-CM

## 2017-04-15 DIAGNOSIS — Z95828 Presence of other vascular implants and grafts: Secondary | ICD-10-CM | POA: Diagnosis not present

## 2017-04-15 DIAGNOSIS — R188 Other ascites: Secondary | ICD-10-CM | POA: Diagnosis present

## 2017-04-15 DIAGNOSIS — I480 Paroxysmal atrial fibrillation: Secondary | ICD-10-CM | POA: Diagnosis not present

## 2017-04-15 LAB — CBC WITH DIFFERENTIAL/PLATELET
Basophils Absolute: 0.1 10*3/uL (ref 0.0–0.1)
Basophils Relative: 1 %
EOS ABS: 0.1 10*3/uL (ref 0.0–0.7)
Eosinophils Relative: 1 %
HEMATOCRIT: 41.8 % (ref 39.0–52.0)
Hemoglobin: 13.5 g/dL (ref 13.0–17.0)
LYMPHS ABS: 1 10*3/uL (ref 0.7–4.0)
Lymphocytes Relative: 12 %
MCH: 32.2 pg (ref 26.0–34.0)
MCHC: 32.3 g/dL (ref 30.0–36.0)
MCV: 99.8 fL (ref 78.0–100.0)
MONOS PCT: 11 %
Monocytes Absolute: 1 10*3/uL (ref 0.1–1.0)
Neutro Abs: 6.6 10*3/uL (ref 1.7–7.7)
Neutrophils Relative %: 75 %
Platelets: 255 10*3/uL (ref 150–400)
RBC: 4.19 MIL/uL — ABNORMAL LOW (ref 4.22–5.81)
RDW: 17.6 % — ABNORMAL HIGH (ref 11.5–15.5)
WBC: 8.7 10*3/uL (ref 4.0–10.5)

## 2017-04-15 LAB — COMPREHENSIVE METABOLIC PANEL
ALK PHOS: 500 U/L — AB (ref 38–126)
ALT: 74 U/L — ABNORMAL HIGH (ref 17–63)
AST: 111 U/L — AB (ref 15–41)
Albumin: 2.6 g/dL — ABNORMAL LOW (ref 3.5–5.0)
Anion gap: 8 (ref 5–15)
BILIRUBIN TOTAL: 1.8 mg/dL — AB (ref 0.3–1.2)
BUN: 28 mg/dL — ABNORMAL HIGH (ref 6–20)
CHLORIDE: 104 mmol/L (ref 101–111)
CO2: 28 mmol/L (ref 22–32)
CREATININE: 1.24 mg/dL (ref 0.61–1.24)
Calcium: 9.2 mg/dL (ref 8.9–10.3)
GLUCOSE: 138 mg/dL — AB (ref 65–99)
Potassium: 4.4 mmol/L (ref 3.5–5.1)
Sodium: 140 mmol/L (ref 135–145)
Total Protein: 5.9 g/dL — ABNORMAL LOW (ref 6.5–8.1)

## 2017-04-15 LAB — PROTIME-INR
INR: 1.23
Prothrombin Time: 15.6 seconds — ABNORMAL HIGH (ref 11.4–15.2)

## 2017-04-15 LAB — AMMONIA: AMMONIA: 84 umol/L — AB (ref 9–35)

## 2017-04-15 MED ORDER — LACTULOSE 10 GM/15ML PO SOLN
30.0000 g | Freq: Three times a day (TID) | ORAL | Status: DC
  Administered 2017-04-15 – 2017-04-18 (×9): 30 g via ORAL
  Administered 2017-04-18: 40 g via ORAL
  Administered 2017-04-18 – 2017-04-19 (×2): 30 g via ORAL
  Filled 2017-04-15 (×12): qty 60

## 2017-04-15 MED ORDER — TETANUS-DIPHTH-ACELL PERTUSSIS 5-2.5-18.5 LF-MCG/0.5 IM SUSP
0.5000 mL | Freq: Once | INTRAMUSCULAR | Status: AC
  Administered 2017-04-15: 0.5 mL via INTRAMUSCULAR
  Filled 2017-04-15: qty 0.5

## 2017-04-15 NOTE — ED Notes (Signed)
icu states they are unable to take report and will call back to get report when they are available.

## 2017-04-15 NOTE — ED Triage Notes (Signed)
Pt and spouse are deaf Pt has hx of Copd and fell at home In by EMS

## 2017-04-15 NOTE — H&P (Signed)
History and Physical  [name] OIN:[id number] DOB: [birth year] DOA: 04/15/2017  Referring physician: Dr Winfred Leeds, ED physician PCP: Petra Kuba, MD  Outpatient Specialists:  Dr Gala Romney (GI) Dr Harl Bowie (Cards)  Patient Coming From: home  Chief Complaint: confusion, fall  HPI: [name] is a 56 y.o. male with a history of [name] cirrhosis, alcohol use, status post TIPS on 01/12/17, atrial flutter, COPD, diabetes on insulin, GERD, hypothyroidism, essential hypertension, sleep apnea on BiPAP. Patient has been increasingly confused over the past 3-4 days and increasingly unsteady on his feet. Due to confusion, history obtained from the patient's wife. The patient has not been taking his medications despite his wife hounding him. The patient had a fall yesterday, as well as today. He does use a walker and wheelchair occasionally at home, but mostly he relates dependently. [name] wife was not around him at the time of the fall. ER provider notes abrasions. Head CT was negative. LFTs slightly higher than baseline. Ammonia level elevated to 80s  Review of Systems:  Unable to perform secondary to confusion, but patient without complaint  Past Medical History:  Diagnosis Date  . Alcohol use   . Anemia   . Aortic stenosis    mild  . Arthritis   . Atrial flutter ([place])   . Benign hypertrophy of prostate    with urinary retention; repaired hypospadia; transurethral resection of the prostate   . Cardiac conduction disorder    status post pacemaker implantation in 1995 generator replaced 2005  . Chest pain 2007, 2009   cardiac cath > normal coronary arterie; nl left ventricular function  . CHF (congestive heart failure) ([place])   . Cirrhosis ([place]) 11/2016  . Congenital deafness   . COPD (chronic obstructive pulmonary disease) ([place])   . Diabetes mellitus    +Insulin  . Dyspnea   . Edema   . GERD (gastroesophageal reflux disease)    status post multiple dilatations for  stricture  . 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 Hepatitis    Thinks it was B  . History of kidney stones   . HTN (hypertension)   . Hyperlipidemia    statin discontinued due to abn LFTs  . Hypothyroidism   . Mitral regurgitation    insignificant  . Obesity   . Obesity 4/08   BMI= 40  . Pelvic mass    identified in 2009, stable 2010  . PONV (postoperative nausea and vomiting)   . Presence of permanent cardiac pacemaker   . Sleep apnea    BiPAP and continuous oxygen  . Syncope   . Thyroid disease    Past Surgical History:  Procedure Laterality Date  . A-V CARDIAC PACEMAKER INSERTION  1995  . COLONOSCOPY  2008  . COLONOSCOPY N/A 06/23/2015   BSJ:GGEZMOQ diverticulosis  . ESOPHAGOGASTRODUODENOSCOPY N/A 06/23/2015   HUT:MLYY portal gastropathy  . IR GENERIC HISTORICAL  12/14/2016   IR PARACENTESIS 12/14/2016 Sandi Mariscal, MD [facility]INTER[facility]  . IR GENERIC HISTORICAL  12/14/2016   IR TIPS 12/14/2016 Sandi Mariscal, MD [facility]  . IR PARACENTESIS  01/12/2017  . IR RADIOLOGIST EVAL & MGMT  01/06/2017  . IR RADIOLOGIST EVAL & MGMT  02/08/2017  . IR RADIOLOGIST EVAL & MGMT  11/30/2016  . IR TIPS  01/12/2017  . NASAL SINUS SURGERY     partially removed  . ORCHIECTOMY  1990   bilateral; ?neoplasm  . PACEMAKER  INSERTION  05/2004; 10/04/2013   MDT dual chamber pacemaker; gen change 10/04/2013 (MDT ADDRL1)  . PERMANENT PACEMAKER GENERATOR CHANGE N/A 10/04/2013   Procedure: PERMANENT PACEMAKER GENERATOR CHANGE;  Surgeon: Evans Lance, MD;  Location[facility] CATH LAB;  Service: Cardiovascular;  Laterality: N/A;  . RADIOLOGY WITH ANESTHESIA N/A 12/14/2016   Procedure: TIPS;  Surgeon: Sandi Mariscal, MD;  Location: [place];  Service: Radiology;  Laterality: N/A;  . RADIOLOGY WITH ANESTHESIA N/A 01/12/2017   Procedure: TIPS;  Surgeon: Sandi Mariscal, MD;  Location: [place];  Service: Radiology;  Laterality: N/A;  . REPAIR HYPOSPADIAS W/ URETHROPLASTY      . TONSILLECTOMY AND ADENOIDECTOMY    . TRANSURETHRAL RESECTION OF PROSTATE     for BPH   Social History:  reports that he has never smoked. He has never used smokeless tobacco. He reports that he does not drink alcohol or use drugs. Patient lives at [facility]  . Penicillins Swelling    SWELLING OF AIRWAY  PATIENT HAD A PCN REACTION WITH IMMEDIATE RASH, FACIAL/TONGUE/THROAT SWELLING, SOB, OR LIGHTHEADEDNESS WITH HYPOTENSION:  #  #  #  YES  #  #  #  Has patient had a PCN reaction causing severe rash involving mucus membranes or skin necrosis: No Has patient had a PCN reaction that required hospitalization No Has patient had a PCN reaction occurring within the last 10 years: No  . Enalapril Cough  . Metformin And Related Diarrhea  . Eliquis [Apixaban] Other (See Comments)    DOSE RELATED BLEEDING    Family History  Problem Relation Age of Onset  . COPD Mother   . Hypertension Mother   . Congestive Heart Failure Mother   . Pneumonia Father   . Heart disease Other   . Hypertension Other   . Colon cancer Neg Hx       Prior to Admission medications   Medication Sig Start Date End Date Taking? Authorizing Provider  acetaminophen (TYLENOL) 325 MG tablet Take 650 mg by mouth every 6 (six) hours as needed for mild pain.   Yes [provider]  albuterol (PROVENTIL HFA;VENTOLIN HFA) 108 (90 Base) MCG/ACT inhaler Inhale 2 puffs into the lungs every 6 (six) hours as needed for wheezing or shortness of breath.   Yes [provider]  albuterol (PROVENTIL) (2.5 MG/3ML) 0.083% nebulizer solution Take 2.5 mg by nebulization every 6 (six) hours as needed for wheezing or shortness of breath.   Yes [provider]  aspirin 325 MG EC tablet Take 325 mg by mouth daily.   Yes [provider]  cetirizine (ZYRTEC) 10 MG tablet Take 10 mg by mouth daily.   Yes [provider]  furosemide (LASIX) 20 MG tablet Take 1 tablet (20 mg  total) by mouth daily. 03/22/17  Yes Annitta Needs, NP  gentamicin ointment (GARAMYCIN) 0.1 % Apply 1 application topically at bedtime. Inside nose    Yes [provider]  Glucosamine-Chondroit-Vit C-Mn (GLUCOSAMINE CHONDR 1500 COMPLX PO) Take 1 tablet by mouth every morning.   Yes [provider]  HUMALOG MIX 75/25 KWIKPEN (75-25) 100 UNIT/ML Kwikpen Inject 8 Units into the skin 2 (two) times daily. 15 units in the morning and 15 units in the evening Patient taking differently: Inject 0-8 Units into the skin 2 (two) times daily. Takes 8 units every morning. Takes a dose in the evening using a sliding scale. 4units in evening 11/14/16  Yes Eber Jones, MD  lactulose (CHRONULAC) 10 GM/15ML solution 10cc (2 teaspoons) bid; Titrate to 2-3 semi formed stools a day Patient taking differently: Take 6.7 g by mouth 2 (two) times daily. 10cc (2 teaspoons) bid; Titrate to 2-3 semi formed stools a day 11/14/16  Yes Eber Jones, MD  levothyroxine (LEVOXYL) 50 MCG tablet Take 50 mcg by mouth daily before breakfast.    Yes [provider]  metoprolol succinate (TOPROL-XL) 25 MG 24 hr tablet Take 25 mg by mouth daily.   Yes [provider]  modafinil (PROVIGIL) 200 MG tablet Take 200 mg by mouth daily.  01/13/15  Yes [provider]  mometasone (NASONEX) 50 MCG/ACT nasal spray Place 2 sprays into the nose daily as needed (FOR CONGESTION/ALLERGIES).   Yes [provider]  Multiple Vitamins-Minerals (CENTRUM) tablet Take 1 tablet by mouth daily.     Yes [provider]  nitroGLYCERIN (NITROSTAT) 0.4 MG SL tablet Place 1 tablet (0.4 mg total) under the tongue every 5 (five) minutes as needed for chest pain. 06/26/15  Yes Lendon Colonel, NP  NON FORMULARY Bipap 16.0/e   Yes [provider]  Omega-3 Fatty Acids (FISH OIL) 1000 MG CAPS Take 1,000 mg by mouth every other day.    Yes [provider]  omeprazole (PRILOSEC) 40  MG capsule TAKE 1 CAPSULE BY MOUTH ONCE DAILY FOR REFLUX. 04/11/17  Yes Branch, Alphonse Guild, MD  spironolactone (ALDACTONE) 50 MG tablet Take 1 tablet (50 mg total) by mouth daily. 03/22/17  Yes Annitta Needs, NP  ursodiol (ACTIGALL) 500 MG tablet TAKE 1 TABLET BY MOUTH TWICE DAILY 08/11/16  Yes Carlis Stable, NP    Physical Exam: BP (!) 140/96   Pulse 91   Temp (!) 97.3 F (36.3 C) (Oral)   Resp (!) 23   Ht 5\' 3"  (1.6 m)   Wt 76.2 kg (168 lb)   SpO2 92%   BMI 29.76 kg/m   General: Middle-aged Caucasian male who appears older than stated age. Awake and alert and oriented to person. No acute cardiopulmonary distress.  HEENT: Normocephalic atraumatic.  Right and left ears normal in appearance.  Pupils equal, round, reactive to light. Extraocular muscles are intact. Sclerae slightly icteric, but noninjected.  Moist mucosal membranes. No mucosal lesions.  Neck: Neck supple without lymphadenopathy. No carotid bruits. No masses palpated.  Cardiovascular: Regular rate with normal S1-S2 sounds. No murmurs, rubs, gallops auscultated. No JVD.  Respiratory: Good respiratory effort with no wheezes, rales, rhonchi. Lungs clear to auscultation bilaterally.  No accessory muscle use. Abdomen: Soft, nontender, nondistended. Active bowel sounds. No masses or hepatosplenomegaly  Skin: No rashes, lesions, or ulcerations.  Dry, warm to touch. 2+ dorsalis pedis and radial pulses. Musculoskeletal: No calf or leg pain. All major joints not erythematous nontender.  No upper or lower joint deformation.  Good ROM.  No contractures  Psychiatric: Unable to assess judgment and insight. Pleasant and cooperative. Neurologic: Mild asterixis, no focal deficits. Muscle testing unable to perform secondary to patient noncompliance           Labs on Admission: I have personally reviewed following labs and imaging studies  CBC:  Recent Labs Lab 04/15/17 1450  WBC 8.7  NEUTROABS 6.6  HGB 13.5  HCT 41.8  MCV 99.8  PLT  601   Basic Metabolic Panel:  Recent Labs Lab 04/15/17 1450  NA 140  K 4.4  CL 104  CO2 28  GLUCOSE 138*  BUN 28*  CREATININE 1.24  CALCIUM 9.2   GFR: Estimated Creatinine Clearance: 60.8 mL/min (by C-G formula based on SCr of 1.24 mg/dL). Liver Function Tests:  Recent Labs Lab 04/15/17 1450  AST 111*  ALT 74*  ALKPHOS 500*  BILITOT 1.8*  PROT 5.9*  ALBUMIN 2.6*   No results for input(s): LIPASE, AMYLASE in the last 168 hours.  Recent Labs Lab 04/15/17 1450  AMMONIA 84*   Coagulation Profile:  Recent Labs Lab 04/15/17 1508  INR 1.23   Cardiac Enzymes: No results for input(s): CKTOTAL, CKMB, CKMBINDEX, TROPONINI in the last 168 hours. BNP (last 3 results) No results for input(s): PROBNP in the last 8760 hours. HbA1C: No results for input(s): HGBA1C in the last 72 hours. CBG: No results for input(s): GLUCAP in the last 168 hours. Lipid Profile: No results for input(s): CHOL, HDL, LDLCALC, TRIG, CHOLHDL, LDLDIRECT in the last 72 hours. Thyroid Function Tests: No results for input(s): TSH, T4TOTAL, FREET4, T3FREE, THYROIDAB in the last 72 hours. Anemia Panel: No results for input(s): VITAMINB12, FOLATE, FERRITIN, TIBC, IRON, RETICCTPCT in the last 72 hours. Urine analysis:    Component Value Date/Time   COLORURINE AMBER (A) 12/24/2016 2100   APPEARANCEUR HAZY (A) 12/24/2016 2100   LABSPEC 1.033 (H) 12/24/2016 2100   PHURINE 5.0 12/24/2016 2100   GLUCOSEU NEGATIVE 12/24/2016 2100   HGBUR NEGATIVE 12/24/2016 2100   [place] NEGATIVE 12/24/2016 2100   [place] NEGATIVE 12/24/2016 2100   PROTEINUR NEGATIVE 12/24/2016 2100   UROBILINOGEN 0.2 05/13/2015 1315   NITRITE NEGATIVE 12/24/2016 2100   LEUKOCYTESUR SMALL (A) 12/24/2016 2100   Sepsis Labs: @LABRCNTIP (procalcitonin:4,lacticidven:4) )No results found for this or any previous visit (from the past 240 hour(s)).   Radiological Exams on Admission: Dg Chest 2 View  Result Date:  04/15/2017 CLINICAL DATA:  Altered mental status. Fall. Left parietal hematoma. EXAM: CHEST  2 VIEW COMPARISON:  None. FINDINGS: The heart size is exaggerated by low lung volumes. Pacing wires are stable. There is no edema or effusion. No focal airspace disease is present. Degenerative changes are noted at the shoulders. IMPRESSION: 1. Low lung volumes. 2. No acute cardiopulmonary disease. Electronically Signed   By: San Morelle M.D.   On: 04/15/2017 15:13   Ct Head Wo Contrast  Result Date: 04/15/2017 CLINICAL DATA:  Left parietal hematoma after falling at home. EXAM: CT HEAD WITHOUT CONTRAST TECHNIQUE: Contiguous axial images were obtained from the base of the skull through the vertex without intravenous contrast. COMPARISON:  01/27/2017. FINDINGS: Brain: Diffusely enlarged ventricles and subarachnoid spaces. Patchy white matter low density in both cerebral hemispheres. No intracranial hemorrhage, mass lesion or CT evidence of acute infarction. Vascular: No hyperdense vessel or unexpected calcification. Skull: Normal. Negative for fracture or focal lesion. Sinuses/Orbits: Aplastic frontal sinuses. Stable small amount of bilateral ethmoid air cell opacification. Decreased right mastoid air cell opacification. Other: None. IMPRESSION: 1. No acute abnormality. 2. Stable mild diffuse cerebral and cerebellar atrophy. 3. Stable mild to moderate chronic small vessel white matter ischemic changes in both cerebral hemispheres. 4. Right mastoid air cell opacification with improvement. 5. Stable mild chronic bilateral ethmoid sinusitis. Electronically Signed   By: Claudie Revering M.D.   On: 04/15/2017 15:27    EKG: Independently reviewed. Sinus rhythm. Early R-wave progression. Right axis deviation. No acute ST changes.  Assessment/Plan: Principal Problem:   Acute metabolic encephalopathy Active Problems:   Type 2 diabetes mellitus ([place])   GASTROESOPHAGEAL REFLUX DISEASE   Hypertension   Sleep apnea    Atrial fibrillation ([place])  Elevated LFTs   Liver cirrhosis secondary to NASH [facility])    This patient was discussed with the ED physician, including pertinent vitals, physical exam findings, labs, and imaging.  We also discussed care given by the ED provider.  #1 metabolic encephalopathy  Admit to stepdown  Secondary to elevated ammonia level due to noncompliance  Start lactulose  Recheck ammonia level tomorrow  Consult GI #2 diabetes  Continue home insulin regimen  Sliding scale insulin with CBGs before meals and daily at bedtime #3 hypertension  Antihypertensives #4 liver cirrhosis  Stable #5 elevated LFTs  Stable #6 atrial fibrillation  Chads 2 Vasc score of 3  Not on adequate ligation secondary to nosebleeds  Currently rate controlled #7 GERD  Protonix #8 sleep apnea  BiPAP  DVT prophylaxis: Lovenox Consultants: GI Code Status: Full code Family Communication: Wife present during interview  Disposition Plan: Patient should be able to return home   Truett Mainland, DO [facility] Pager [phone]  If 7PM-7AM, please contact night-coverage [email or web address] Password [email or web address]

## 2017-04-15 NOTE — ED Provider Notes (Signed)
[facility] DEPT Provider Note   CSN: [id number] Arrival date & time: 04/15/17  1323   Level V caveat altered mental status history is obtained from patient's wife and from EMS  History   Chief Complaint Chief Complaint  Patient presents with  . Fall    HPI [name] is a 56 y.o. male. Patient fell today striking both hands and feet has a wheelchair and walker at home. He does not always use them. His wife reports that he's been progressively more confused over the past 4 days. He has missed some doses of his lactulose. She denies pain anywhere.  HPI  Past Medical History:  Diagnosis Date  . Alcohol use   . Anemia   . Aortic stenosis    mild  . Arthritis   . Atrial flutter ([place])   . Benign hypertrophy of prostate    with urinary retention; repaired hypospadia; transurethral resection of the prostate   . Cardiac conduction disorder    status post pacemaker implantation in 1995 generator replaced 2005  . Chest pain 2007, 2009   cardiac cath > normal coronary arterie; nl left ventricular function  . CHF (congestive heart failure) ([place])   . Cirrhosis ([place]) 11/2016  . Congenital deafness   . COPD (chronic obstructive pulmonary disease) ([place])   . Diabetes mellitus    +Insulin  . Dyspnea   . Edema   . GERD (gastroesophageal reflux disease)    status post multiple dilatations for stricture  . 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 Hepatitis    Thinks it was B  . History of kidney stones   . HTN (hypertension)   . Hyperlipidemia    statin discontinued due to abn LFTs  . Hypothyroidism   . Mitral regurgitation    insignificant  . Obesity   . Obesity 4/08   BMI= 40  . Pelvic mass    identified in 2009, stable 2010  . PONV (postoperative nausea and vomiting)   . Presence of permanent cardiac pacemaker   . Sleep apnea    BiPAP and continuous oxygen  . Syncope   . Thyroid disease     Patient  Active Problem List   Diagnosis Date Noted  . History of head injury 01/27/2017  . Malnutrition of moderate degree 12/24/2016  . Chronic respiratory failure with hypoxia ([place]) 12/24/2016  . Fever 12/23/2016  . COPD (chronic obstructive pulmonary disease) ([place]) 12/23/2016  . Diabetes mellitus 12/23/2016  . Liver cirrhosis secondary to NASH ([place]) 12/14/2016  . Anemia 11/11/2016  . Lactic acidosis 11/11/2016  . AKI (acute kidney injury) ([place]) 11/11/2016  . Hyperkalemia 11/11/2016  . Hypothyroidism 07/15/2016  . Elevated alkaline phosphatase level 06/14/2016  . Elevated LFTs 06/14/2016  . Ascites   . Diarrhea 02/29/2016  . Abnormal liver function 02/29/2016  . Atrial flutter ([place]) 02/29/2016  . Hypoglycemia 02/29/2016  . Abdominal pain 02/29/2016  . Jejunitis   . Adenomatous colon polyp 09/01/2015  . Portal hypertensive gastropathy ([place])   . Heme + stool 06/02/2015  . Leukocytosis, unspecified 10/26/2013  . Hematuria 10/26/2013  . UTI (lower urinary tract infection) 10/25/2013  . HTN (hypertension)   . Atrial fibrillation ([place]) 10/19/2013  . Chronic diastolic heart failure ([place]) 09/19/2013  . Chest pain   . Aortic stenosis   . Hypertension   . Hyperlipidemia   . Pelvic mass   . Cirrhosis of liver with ascites ([place])   .  Benign hypertrophy of prostate   . Sleep apnea   . GASTROESOPHAGEAL REFLUX DISEASE 05/19/2010  . Presence of permanent cardiac pacemaker 03/18/2010  . Type 2 diabetes mellitus ([place]) 06/27/2008  . ANEMIA, MILD 06/27/2008  . Deafness congenital 06/27/2008  . CONDUCTION DISORDER OF THE HEART 06/27/2008    Past Surgical History:  Procedure Laterality Date  . A-V CARDIAC PACEMAKER INSERTION  1995  . COLONOSCOPY  2008  . COLONOSCOPY N/A 06/23/2015   TIR:[id number] diverticulosis  . ESOPHAGOGASTRODUODENOSCOPY N/A 06/23/2015   GQQ:PYPP portal gastropathy  . IR GENERIC HISTORICAL  12/14/2016   IR PARACENTESIS 12/14/2016 Sandi Mariscal, MD [facility]  . IR GENERIC  HISTORICAL  12/14/2016   IR TIPS 12/14/2016 Sandi Mariscal, MD [facility]  . IR PARACENTESIS  01/12/2017  . IR RADIOLOGIST EVAL & MGMT  01/06/2017  . IR RADIOLOGIST EVAL & MGMT  02/08/2017  . IR RADIOLOGIST EVAL & MGMT  11/30/2016  . IR TIPS  01/12/2017  . NASAL SINUS SURGERY     partially removed  . ORCHIECTOMY  1990   bilateral; ?neoplasm  . PACEMAKER INSERTION  05/2004; 10/04/2013   MDT dual chamber pacemaker; gen change 10/04/2013 (MDT ADDRL1)  . PERMANENT PACEMAKER GENERATOR CHANGE N/A 10/04/2013   Procedure: PERMANENT PACEMAKER GENERATOR CHANGE;  Surgeon: Evans Lance, MD;  Location: [facility] CATH LAB;  Service: Cardiovascular;  Laterality: N/A;  . RADIOLOGY WITH ANESTHESIA N/A 12/14/2016   Procedure: TIPS;  Surgeon: Sandi Mariscal, MD;  Location: [facility];  Service: Radiology;  Laterality: N/A;  . RADIOLOGY WITH ANESTHESIA N/A 01/12/2017   Procedure: TIPS;  Surgeon: Sandi Mariscal, MD;  Location: [facility];  Service: Radiology;  Laterality: N/A;  . REPAIR HYPOSPADIAS W/ URETHROPLASTY    . TONSILLECTOMY AND ADENOIDECTOMY    . TRANSURETHRAL RESECTION OF PROSTATE     for BPH       Home Medications    Prior to Admission medications   Medication Sig Start Date End Date Taking? Authorizing Provider  acetaminophen (TYLENOL) 325 MG tablet Take 650 mg by mouth every 6 (six) hours as needed for mild pain.   Yes [provider]  albuterol (PROVENTIL HFA;VENTOLIN HFA) 108 (90 Base) MCG/ACT inhaler Inhale 2 puffs into the lungs every 6 (six) hours as needed for wheezing or shortness of breath.   Yes [provider]  albuterol (PROVENTIL) (2.5 MG/3ML) 0.083% nebulizer solution Take 2.5 mg by nebulization every 6 (six) hours as needed for wheezing or shortness of breath.   Yes [provider]  aspirin 325 MG EC tablet Take 325 mg by mouth daily.   Yes [provider]  cetirizine (ZYRTEC) 10 MG tablet Take 10 mg by mouth daily.   Yes [provider]  furosemide (LASIX) 20 MG  tablet Take 1 tablet (20 mg total) by mouth daily. 03/22/17  Yes Annitta Needs, NP  gentamicin ointment (GARAMYCIN) 0.1 % Apply 1 application topically at bedtime. Inside nose    Yes [provider]  Glucosamine-Chondroit-Vit C-Mn (GLUCOSAMINE CHONDR 1500 COMPLX PO) Take 1 tablet by mouth every morning.   Yes [provider]  HUMALOG MIX 75/25 KWIKPEN (75-25) 100 UNIT/ML Kwikpen Inject 8 Units into the skin 2 (two) times daily. 15 units in the morning and 15 units in the evening Patient taking differently: Inject 0-8 Units into the skin 2 (two) times daily. Takes 8 units every morning. Takes a dose in the evening using a sliding scale. 11/14/16  Yes Eber Jones, MD  lactulose (  CHRONULAC) 10 GM/15ML solution 10cc (2 teaspoons) bid; Titrate to 2-3 semi formed stools a day Patient taking differently: Take 6.7 g by mouth 2 (two) times daily. 10cc (2 teaspoons) bid; Titrate to 2-3 semi formed stools a day 11/14/16  Yes Eber Jones, MD  levothyroxine (LEVOXYL) 50 MCG tablet Take 50 mcg by mouth daily before breakfast.    Yes [provider]  metoprolol succinate (TOPROL-XL) 25 MG 24 hr tablet Take 25 mg by mouth daily.   Yes [provider]  modafinil (PROVIGIL) 200 MG tablet Take 200 mg by mouth daily.  01/13/15  Yes [provider]  mometasone (NASONEX) 50 MCG/ACT nasal spray Place 2 sprays into the nose daily as needed (FOR CONGESTION/ALLERGIES).   Yes [provider]  nitroGLYCERIN (NITROSTAT) 0.4 MG SL tablet Place 1 tablet (0.4 mg total) under the tongue every 5 (five) minutes as needed for chest pain. 06/26/15  Yes Lendon Colonel, NP  Omega-3 Fatty Acids (FISH OIL) 1000 MG CAPS Take 1,000 mg by mouth every other day.    Yes [provider]  omeprazole (PRILOSEC) 40 MG capsule TAKE 1 CAPSULE BY MOUTH ONCE DAILY FOR REFLUX. 04/11/17  Yes Branch, Alphonse Guild, MD  spironolactone (ALDACTONE) 50 MG tablet Take 1 tablet (50 mg  total) by mouth daily. 03/22/17  Yes Annitta Needs, NP  ursodiol (ACTIGALL) 500 MG tablet TAKE 1 TABLET BY MOUTH TWICE DAILY 08/11/16  Yes Carlis Stable, NP  Multiple Vitamins-Minerals (CENTRUM) tablet Take 1 tablet by mouth daily.      [provider]  NON FORMULARY Bipap 16.0/e    [provider]    Family History Family History  Problem Relation Age of Onset  . COPD Mother   . Hypertension Mother   . Congestive Heart Failure Mother   . Pneumonia Father   . Heart disease Other   . Hypertension Other   . Colon cancer Neg Hx     Social History Social History  Substance Use Topics  . Smoking status: Never Smoker  . Smokeless tobacco: Never Used     Comment: tobacco use- no   . Alcohol use No     Comment: Hx. of alcohol use     Allergies   Penicillins; Enalapril; Metformin and related; and Eliquis [apixaban]   Review of Systems Review of Systems  Unable to perform ROS: Mental status change  HENT: Positive for hearing loss.        Chronically hearing impaired  Musculoskeletal: Positive for gait problem.  Skin: Positive for wound.       Abrasions as result fall  Allergic/Immunologic: Positive for immunocompromised state.       Unknown tetanus immunization     Physical Exam Updated Vital Signs BP 132/70 (BP Location: Right Arm)   Pulse 91   Resp 16   Ht 5\' 3"  (1.6 m)   Wt 76.2 kg (168 lb)   SpO2 96%   BMI 29.76 kg/m   Physical Exam  Constitutional:  Chronically ill-appearing  HENT:  Head: Normocephalic and atraumatic.  Right Ear: External ear normal.  Left Ear: External ear normal.  Mouth/Throat: Oropharynx is clear and moist.  2 cm hematoma at left parietal area otherwise normocephalic atraumatic  Eyes: Pupils are equal, round, and reactive to light. Conjunctivae are normal.  Neck: Neck supple. No tracheal deviation present. No thyromegaly present.  Cardiovascular: Normal rate and regular rhythm.   No murmur heard. Pulmonary/Chest:  Effort normal and breath sounds  normal.  Abdominal: Soft. Bowel sounds are normal. He exhibits no distension. There is no tenderness.  Musculoskeletal: Normal range of motion. He exhibits no edema or tenderness.  All 4 extremities without deformity or swelling or tenderness neurovascular intact. Entire spine nontender. Pelvis stable nontender  Neurological: He is alert. Coordination normal.   positive asterixis disoriented to date with name and hospital  Skin: Skin is warm and dry. No rash noted.  Jaundice 2 cm abrasion at left elbow, 2 cm abrasion at the dorsal left distal forearm 1 cm abrasion at left right dorsal hand. 2 mm abrasion right shin  Psychiatric: He has a normal mood and affect.  Nursing note and vitals reviewed.    ED Treatments / Results  Labs (all labs ordered are listed, but only abnormal results are displayed) Labs Reviewed  COMPREHENSIVE METABOLIC PANEL  CBC WITH DIFFERENTIAL/PLATELET  AMMONIA    EKG  EKG Interpretation None      ED ECG REPORT   Date: 04/15/2017  Rate: 70  Rhythm: Ectopic atrial pacemaker  QRS Axis: right  Intervals: normal  ST/T Wave abnormalities: nonspecific T wave changes  Conduction Disutrbances:nonspecific intraventricular conduction delay  Narrative Interpretation:   Old EKG Reviewed: Tracing from 12/23/2016 showed left axis deviation  I have personally reviewed the EKG tracing and agree with the computerized printout as noted. Radiology No results found.  Procedures Procedures (including critical care time)  Medications Ordered in ED Medications  Tdap (BOOSTRIX) injection 0.5 mL (0.5 mLs Intramuscular Given 04/15/17 1447)    Chest x-ray viewed by me Results for orders placed or performed during the hospital encounter of 04/15/17  Comprehensive metabolic panel  Result Value Ref Range   Sodium 140 135 - 145 mmol/L   Potassium 4.4 3.5 - 5.1 mmol/L   Chloride 104 101 - 111 mmol/L   CO2 28 22 - 32 mmol/L   Glucose, Bld  138 (H) 65 - 99 mg/dL   BUN 28 (H) 6 - 20 mg/dL   Creatinine, Ser 1.24 0.61 - 1.24 mg/dL   Calcium 9.2 8.9 - 10.3 mg/dL   Total Protein 5.9 (L) 6.5 - 8.1 g/dL   Albumin 2.6 (L) 3.5 - 5.0 g/dL   AST 111 (H) 15 - 41 U/L   ALT 74 (H) 17 - 63 U/L   Alkaline Phosphatase 500 (H) 38 - 126 U/L   Total Bilirubin 1.8 (H) 0.3 - 1.2 mg/dL   GFR calc non Af Amer >60 >60 mL/min   GFR calc Af Amer >60 >60 mL/min   Anion gap 8 5 - 15  CBC with Differential/Platelet  Result Value Ref Range   WBC 8.7 4.0 - 10.5 K/uL   RBC 4.19 (L) 4.22 - 5.81 MIL/uL   Hemoglobin 13.5 13.0 - 17.0 g/dL   HCT 41.8 39.0 - 52.0 %   MCV 99.8 78.0 - 100.0 fL   MCH 32.2 26.0 - 34.0 pg   MCHC 32.3 30.0 - 36.0 g/dL   RDW 17.6 (H) 11.5 - 15.5 %   Platelets 255 150 - 400 K/uL   Neutrophils Relative % 75 %   Neutro Abs 6.6 1.7 - 7.7 K/uL   Lymphocytes Relative 12 %   Lymphs Abs 1.0 0.7 - 4.0 K/uL   Monocytes Relative 11 %   Monocytes Absolute 1.0 0.1 - 1.0 K/uL   Eosinophils Relative 1 %   Eosinophils Absolute 0.1 0.0 - 0.7 K/uL   Basophils Relative 1 %   Basophils Absolute 0.1 0.0 - 0.1  K/uL  Ammonia  Result Value Ref Range   Ammonia 84 (H) 9 - 35 umol/L  Protime-INR  Result Value Ref Range   Prothrombin Time 15.6 (H) 11.4 - 15.2 seconds   INR 1.23    Dg Chest 2 View  Result Date: 04/15/2017 CLINICAL DATA:  Altered mental status. Fall. Left parietal hematoma. EXAM: CHEST  2 VIEW COMPARISON:  None. FINDINGS: The heart size is exaggerated by low lung volumes. Pacing wires are stable. There is no edema or effusion. No focal airspace disease is present. Degenerative changes are noted at the shoulders. IMPRESSION: 1. Low lung volumes. 2. No acute cardiopulmonary disease. Electronically Signed   By: San Morelle M.D.   On: 04/15/2017 15:13   Ct Head Wo Contrast  Result Date: 04/15/2017 CLINICAL DATA:  Left parietal hematoma after falling at home. EXAM: CT HEAD WITHOUT CONTRAST TECHNIQUE: Contiguous axial images  were obtained from the base of the skull through the vertex without intravenous contrast. COMPARISON:  01/27/2017. FINDINGS: Brain: Diffusely enlarged ventricles and subarachnoid spaces. Patchy white matter low density in both cerebral hemispheres. No intracranial hemorrhage, mass lesion or CT evidence of acute infarction. Vascular: No hyperdense vessel or unexpected calcification. Skull: Normal. Negative for fracture or focal lesion. Sinuses/Orbits: Aplastic frontal sinuses. Stable small amount of bilateral ethmoid air cell opacification. Decreased right mastoid air cell opacification. Other: None. IMPRESSION: 1. No acute abnormality. 2. Stable mild diffuse cerebral and cerebellar atrophy. 3. Stable mild to moderate chronic small vessel white matter ischemic changes in both cerebral hemispheres. 4. Right mastoid air cell opacification with improvement. 5. Stable mild chronic bilateral ethmoid sinusitis. Electronically Signed   By: Claudie Revering M.D.   On: 04/15/2017 15:27   Initial Impression / Assessment and Plan / ED Course  I have reviewed the triage vital signs and the nursing notes.  Pertinent labs & imaging results that were available during my care of the patient were reviewed by me and considered in my medical decision making (see chart for details).     Patient suffering from hepatic encephalopathy. Dr. Nehemiah Settle from hospitalist consulted and will arrange for overnight stay and lactulose Transaminases and bilirubin chronically elevated Final Clinical Impressions(s) / ED Diagnoses   Dx #1 hepatic encephalopathy #2 fall  #3 abrasions multiple sites #4hyperbilirubinemia Final diagnoses:  None  #5 abnormal liver function tests  New Prescriptions New Prescriptions   No medications on file     Orlie Dakin, MD 04/15/17 1605

## 2017-04-16 LAB — GLUCOSE, CAPILLARY
GLUCOSE-CAPILLARY: 144 mg/dL — AB (ref 65–99)
GLUCOSE-CAPILLARY: 160 mg/dL — AB (ref 65–99)
GLUCOSE-CAPILLARY: 89 mg/dL (ref 65–99)
Glucose-Capillary: 109 mg/dL — ABNORMAL HIGH (ref 65–99)
Glucose-Capillary: 74 mg/dL (ref 65–99)

## 2017-04-16 LAB — COMPREHENSIVE METABOLIC PANEL
ALBUMIN: 2.5 g/dL — AB (ref 3.5–5.0)
ALT: 88 U/L — ABNORMAL HIGH (ref 17–63)
ANION GAP: 9 (ref 5–15)
AST: 160 U/L — ABNORMAL HIGH (ref 15–41)
Alkaline Phosphatase: 524 U/L — ABNORMAL HIGH (ref 38–126)
BILIRUBIN TOTAL: 1.6 mg/dL — AB (ref 0.3–1.2)
BUN: 24 mg/dL — ABNORMAL HIGH (ref 6–20)
CALCIUM: 8.9 mg/dL (ref 8.9–10.3)
CO2: 25 mmol/L (ref 22–32)
Chloride: 107 mmol/L (ref 101–111)
Creatinine, Ser: 0.9 mg/dL (ref 0.61–1.24)
GFR calc non Af Amer: >60 mL/min (ref >60)
GLUCOSE: 125 mg/dL — AB (ref 65–99)
POTASSIUM: 4 mmol/L (ref 3.5–5.1)
Sodium: 141 mmol/L (ref 135–145)
TOTAL PROTEIN: 5.7 g/dL — AB (ref 6.5–8.1)

## 2017-04-16 LAB — CBC
HEMATOCRIT: 39.6 % (ref 39.0–52.0)
Hemoglobin: 13.1 g/dL (ref 13.0–17.0)
MCH: 33.1 pg (ref 26.0–34.0)
MCHC: 33.1 g/dL (ref 30.0–36.0)
MCV: 100 fL (ref 78.0–100.0)
Platelets: 241 10*3/uL (ref 150–400)
RBC: 3.96 MIL/uL — ABNORMAL LOW (ref 4.22–5.81)
RDW: 17.6 % — AB (ref 11.5–15.5)
WBC: 7.4 10*3/uL (ref 4.0–10.5)

## 2017-04-16 LAB — URINALYSIS, ROUTINE W REFLEX MICROSCOPIC
BILIRUBIN URINE: NEGATIVE
Glucose, UA: NEGATIVE mg/dL
KETONES UR: NEGATIVE mg/dL
Nitrite: NEGATIVE
Protein, ur: NEGATIVE mg/dL
SQUAMOUS EPITHELIAL / LPF: NONE SEEN
Specific Gravity, Urine: 1.014 (ref 1.005–1.030)
pH: 5 (ref 5.0–8.0)

## 2017-04-16 LAB — MAGNESIUM: Magnesium: 2 mg/dL (ref 1.7–2.4)

## 2017-04-16 LAB — AMMONIA: AMMONIA: 79 umol/L — AB (ref 9–35)

## 2017-04-16 LAB — MRSA PCR SCREENING: MRSA BY PCR: NEGATIVE

## 2017-04-16 MED ORDER — SODIUM CHLORIDE 0.9 % IV SOLN
INTRAVENOUS | Status: DC
  Administered 2017-04-16: 01:00:00 via INTRAVENOUS

## 2017-04-16 MED ORDER — RIFAXIMIN 550 MG PO TABS
550.0000 mg | ORAL_TABLET | Freq: Two times a day (BID) | ORAL | Status: DC
  Administered 2017-04-16 – 2017-04-21 (×11): 550 mg via ORAL
  Filled 2017-04-16 (×11): qty 1

## 2017-04-16 MED ORDER — ENSURE ENLIVE PO LIQD
237.0000 mL | Freq: Three times a day (TID) | ORAL | Status: DC
  Administered 2017-04-16 – 2017-04-21 (×9): 237 mL via ORAL

## 2017-04-16 MED ORDER — ASPIRIN EC 325 MG PO TBEC
325.0000 mg | DELAYED_RELEASE_TABLET | Freq: Every day | ORAL | Status: DC
  Administered 2017-04-16 – 2017-04-21 (×6): 325 mg via ORAL
  Filled 2017-04-16 (×6): qty 1

## 2017-04-16 MED ORDER — URSODIOL 300 MG PO CAPS
600.0000 mg | ORAL_CAPSULE | Freq: Two times a day (BID) | ORAL | Status: DC
  Filled 2017-04-16 (×5): qty 2

## 2017-04-16 MED ORDER — INSULIN ASPART 100 UNIT/ML ~~LOC~~ SOLN
0.0000 [IU] | Freq: Three times a day (TID) | SUBCUTANEOUS | Status: DC
  Administered 2017-04-16: 2 [IU] via SUBCUTANEOUS
  Administered 2017-04-16 – 2017-04-18 (×3): 3 [IU] via SUBCUTANEOUS
  Administered 2017-04-20 – 2017-04-21 (×3): 2 [IU] via SUBCUTANEOUS

## 2017-04-16 MED ORDER — ENSURE ENLIVE PO LIQD
237.0000 mL | Freq: Two times a day (BID) | ORAL | Status: DC
  Administered 2017-04-16: 237 mL via ORAL

## 2017-04-16 MED ORDER — ACETAMINOPHEN 325 MG PO TABS
650.0000 mg | ORAL_TABLET | Freq: Four times a day (QID) | ORAL | Status: DC | PRN
  Administered 2017-04-20: 650 mg via ORAL
  Filled 2017-04-16: qty 2

## 2017-04-16 MED ORDER — INSULIN LISPRO PROT & LISPRO (75-25 MIX) 100 UNIT/ML KWIKPEN
8.0000 [IU] | PEN_INJECTOR | Freq: Two times a day (BID) | SUBCUTANEOUS | Status: DC
Start: 2017-04-16 — End: 2017-04-16

## 2017-04-16 MED ORDER — ACETAMINOPHEN 650 MG RE SUPP: 650.0000 mg | Freq: Four times a day (QID) | RECTAL | Status: DC | PRN

## 2017-04-16 MED ORDER — GENTAMICIN SULFATE 0.1 % EX OINT
1.0000 "application " | TOPICAL_OINTMENT | Freq: Every day | CUTANEOUS | Status: DC
  Administered 2017-04-16 – 2017-04-20 (×5): 1 via TOPICAL
  Filled 2017-04-16: qty 15

## 2017-04-16 MED ORDER — LEVOTHYROXINE SODIUM 50 MCG PO TABS
50.0000 ug | ORAL_TABLET | Freq: Every day | ORAL | Status: DC
  Administered 2017-04-16 – 2017-04-21 (×6): 50 ug via ORAL
  Filled 2017-04-16 (×2): qty 1
  Filled 2017-04-16: qty 2
  Filled 2017-04-16 (×2): qty 1
  Filled 2017-04-16: qty 2

## 2017-04-16 MED ORDER — FUROSEMIDE 20 MG PO TABS
20.0000 mg | ORAL_TABLET | Freq: Every day | ORAL | Status: DC
  Administered 2017-04-16 – 2017-04-21 (×6): 20 mg via ORAL
  Filled 2017-04-16 (×6): qty 1

## 2017-04-16 MED ORDER — METOPROLOL SUCCINATE ER 25 MG PO TB24
25.0000 mg | ORAL_TABLET | Freq: Every day | ORAL | Status: DC
  Administered 2017-04-16 – 2017-04-21 (×6): 25 mg via ORAL
  Filled 2017-04-16 (×6): qty 1

## 2017-04-16 MED ORDER — PANTOPRAZOLE SODIUM 40 MG PO TBEC
80.0000 mg | DELAYED_RELEASE_TABLET | Freq: Every day | ORAL | Status: DC
  Administered 2017-04-16 – 2017-04-18 (×3): 80 mg via ORAL
  Filled 2017-04-16 (×3): qty 2

## 2017-04-16 MED ORDER — ONDANSETRON HCL 4 MG PO TABS: 4.0000 mg | ORAL_TABLET | Freq: Four times a day (QID) | ORAL | Status: DC | PRN

## 2017-04-16 MED ORDER — INSULIN ASPART PROT & ASPART (70-30 MIX) 100 UNIT/ML ~~LOC~~ SUSP
8.0000 [IU] | Freq: Two times a day (BID) | SUBCUTANEOUS | Status: DC
  Administered 2017-04-16 – 2017-04-21 (×8): 8 [IU] via SUBCUTANEOUS
  Filled 2017-04-16: qty 10

## 2017-04-16 MED ORDER — INSULIN ASPART 100 UNIT/ML ~~LOC~~ SOLN: 0.0000 [IU] | Freq: Every day | SUBCUTANEOUS | Status: DC

## 2017-04-16 MED ORDER — FLUTICASONE PROPIONATE 50 MCG/ACT NA SUSP
2.0000 | Freq: Every day | NASAL | Status: DC
  Administered 2017-04-16 – 2017-04-21 (×6): 2 via NASAL
  Filled 2017-04-16: qty 16

## 2017-04-16 MED ORDER — ALBUTEROL SULFATE (2.5 MG/3ML) 0.083% IN NEBU: 3.0000 mL | INHALATION_SOLUTION | Freq: Four times a day (QID) | RESPIRATORY_TRACT | Status: DC | PRN

## 2017-04-16 MED ORDER — ACETAMINOPHEN 325 MG PO TABS: 650.0000 mg | ORAL_TABLET | Freq: Four times a day (QID) | ORAL | Status: DC | PRN

## 2017-04-16 MED ORDER — ONDANSETRON HCL 4 MG/2ML IJ SOLN
4.0000 mg | Freq: Four times a day (QID) | INTRAMUSCULAR | Status: DC | PRN
  Administered 2017-04-16: 4 mg via INTRAVENOUS
  Filled 2017-04-16: qty 2

## 2017-04-16 MED ORDER — URSODIOL 500 MG PO TABS
500.0000 mg | ORAL_TABLET | Freq: Two times a day (BID) | ORAL | Status: DC
  Filled 2017-04-16 (×5): qty 1

## 2017-04-16 MED ORDER — ENOXAPARIN SODIUM 40 MG/0.4ML ~~LOC~~ SOLN
40.0000 mg | Freq: Every day | SUBCUTANEOUS | Status: DC
  Administered 2017-04-16 – 2017-04-20 (×6): 40 mg via SUBCUTANEOUS
  Filled 2017-04-16 (×6): qty 0.4

## 2017-04-16 MED ORDER — URSODIOL 300 MG PO CAPS
600.0000 mg | ORAL_CAPSULE | Freq: Two times a day (BID) | ORAL | Status: DC
  Administered 2017-04-16 – 2017-04-21 (×11): 600 mg via ORAL
  Filled 2017-04-16 (×13): qty 2

## 2017-04-16 MED ORDER — SPIRONOLACTONE 25 MG PO TABS
50.0000 mg | ORAL_TABLET | Freq: Every day | ORAL | Status: DC
  Administered 2017-04-16 – 2017-04-21 (×6): 50 mg via ORAL
  Filled 2017-04-16 (×6): qty 2

## 2017-04-16 MED ORDER — MODAFINIL 100 MG PO TABS
200.0000 mg | ORAL_TABLET | Freq: Every day | ORAL | Status: DC
  Administered 2017-04-16 – 2017-04-20 (×5): 200 mg via ORAL
  Filled 2017-04-16: qty 1
  Filled 2017-04-16 (×2): qty 2
  Filled 2017-04-16: qty 1
  Filled 2017-04-16: qty 2
  Filled 2017-04-16: qty 1
  Filled 2017-04-16: qty 2

## 2017-04-16 NOTE — Progress Notes (Signed)
[facility] PROGRESS NOTE  [name] TDD:[id number] DOB: 11/09/1960 DOA: 04/15/2017   PCP: Petra Kuba, MD     Assessment/Plan: Principal Problem:   Acute metabolic encephalopathy Active Problems:   Type 2 diabetes mellitus ([place])   GASTROESOPHAGEAL REFLUX DISEASE   Hypertension   Sleep apnea   Atrial fibrillation ([place])   Elevated LFTs   Liver cirrhosis secondary to NASH [facility])   [name] is a 56 y.o. male with a history of NASH cirrhosis, history ETOH, treated empirically for AMA negative PBC and status post TIPS utilizing a gun-sight technique 01/12/17,  atrial flutter/fibrillation on anticoagulation due to recurrent nosebleeds, COPD, diabetes on insulin, GERD, hypothyroidism, essential hypertension, sleep apnea on BiPAP. Patient has been increasingly confused over the past 3-4 days and increasingly unsteady on his feet. Admitted for hepatic encephalopathy  Assessment and plan   #1 acute on chronic metabolic/hepatic encephalopathy, ammonia level slightly elevated on admission  Continue stepdown pending MS improvement   Secondary to elevated ammonia level due to noncompliance  Continue lactulose, and xifaxan  Recheck ammonia level tomorrow  GI consulted #2 diabetes  Continue home insulin regimen with lispro. Repeat hemoglobin A1c  Sliding scale insulin with CBGs before meals and daily at bedtime #3 hypertension  Antihypertensives-continue Lasix metoprolol Aldactone #4 chronic liver cirrhosis-followed by Roseanne Kaufman ,NP and IR post TIPS   Has a follow-up appointment in November with IR #5 transaminitis-alkaline phosphatase has been elevated for several months, along with AST/ALT  GI input requested, continue actigall #6 atrial fibrillation/flutter/aortic stenosis -followed by Dr. branch  Chads 2 Vasc score of 3  Not on anticoagulation secondary to nosebleeds  Currently rate controlled on metoprolol  Aortic stenosis moderate to  severe as per echo, currently asymptomatic #7 GERD  Protonix #8 sleep apnea  BiPAP   DVT prophylaxsis Lovenox  Code Status:  Full code     Family Communication: Discussed in detail with the patient and wife , all imaging results, lab results explained to the patient   Disposition Plan:  Continue stepdown, pending mental status improvement      Consultants:  GI  Procedures:  None  Antibiotics: Anti-infectives    None         HPI/Subjective: Sleepy but arousable to voice   Objective: Vitals:   04/16/17 0300 04/16/17 0400 04/16/17 0500 04/16/17 0600  BP: 101/80 117/67 110/64 132/85  Pulse: 79 93 70 99  Resp: 17 (!) 21 14 (!) 21  Temp:   97.9 F (36.6 C)   TempSrc:   Oral   SpO2: 96% 96% 95% 95%  Weight:      Height:        Intake/Output Summary (Last 24 hours) at 04/16/17 0802 Last data filed at 04/16/17 0600  Gross per 24 hour  Intake           676.67 ml  Output              175 ml  Net           501.67 ml    Exam:  Examination:  General exam: Appears calm and comfortable  Respiratory system: Clear to auscultation. Respiratory effort normal. Cardiovascular system: S1 & S2 heard, RRR. No JVD, murmurs, rubs, gallops or clicks. No pedal edema. Gastrointestinal system: Abdomen is nondistended, soft and nontender. No organomegaly or masses felt. Normal bowel sounds heard. Central nervous system:  No focal neurological deficits. Extremities: Symmetric 5 x 5 power. Skin:  No rashes, lesions or ulcers Psychiatry: impaired Judgement and insight     Data Reviewed: I have personally reviewed following labs and imaging studies  Micro Results Recent Results (from the past 240 hour(s))  MRSA PCR Screening     Status: None   Collection Time: 04/15/17 11:27 PM  Result Value Ref Range Status   MRSA by PCR NEGATIVE NEGATIVE Final    Comment:        The GeneXpert MRSA Assay (FDA approved for NASAL specimens only), is one component of  a comprehensive MRSA colonization surveillance program. It is not intended to diagnose MRSA infection nor to guide or monitor treatment for MRSA infections.     Radiology Reports Dg Chest 2 View  Result Date: 04/15/2017 CLINICAL DATA:  Altered mental status. Fall. Left parietal hematoma. EXAM: CHEST  2 VIEW COMPARISON:  None. FINDINGS: The heart size is exaggerated by low lung volumes. Pacing wires are stable. There is no edema or effusion. No focal airspace disease is present. Degenerative changes are noted at the shoulders. IMPRESSION: 1. Low lung volumes. 2. No acute cardiopulmonary disease. Electronically Signed   By: San Morelle M.D.   On: 04/15/2017 15:13   Ct Head Wo Contrast  Result Date: 04/15/2017 CLINICAL DATA:  Left parietal hematoma after falling at home. EXAM: CT HEAD WITHOUT CONTRAST TECHNIQUE: Contiguous axial images were obtained from the base of the skull through the vertex without intravenous contrast. COMPARISON:  01/27/2017. FINDINGS: Brain: Diffusely enlarged ventricles and subarachnoid spaces. Patchy white matter low density in both cerebral hemispheres. No intracranial hemorrhage, mass lesion or CT evidence of acute infarction. Vascular: No hyperdense vessel or unexpected calcification. Skull: Normal. Negative for fracture or focal lesion. Sinuses/Orbits: Aplastic frontal sinuses. Stable small amount of bilateral ethmoid air cell opacification. Decreased right mastoid air cell opacification. Other: None. IMPRESSION: 1. No acute abnormality. 2. Stable mild diffuse cerebral and cerebellar atrophy. 3. Stable mild to moderate chronic small vessel white matter ischemic changes in both cerebral hemispheres. 4. Right mastoid air cell opacification with improvement. 5. Stable mild chronic bilateral ethmoid sinusitis. Electronically Signed   By: Claudie Revering M.D.   On: 04/15/2017 15:27     CBC  Recent Labs Lab 04/15/17 1450 04/16/17 0621  WBC 8.7 7.4  HGB 13.5 13.1   HCT 41.8 39.6  PLT 255 241  MCV 99.8 100.0  MCH 32.2 33.1  MCHC 32.3 33.1  RDW 17.6* 17.6*  LYMPHSABS 1.0  --   MONOABS 1.0  --   EOSABS 0.1  --   BASOSABS 0.1  --     Chemistries   Recent Labs Lab 04/15/17 1450 04/16/17 0621  NA 140 141  K 4.4 4.0  CL 104 107  CO2 28 25  GLUCOSE 138* 125*  BUN 28* 24*  CREATININE 1.24 0.90  CALCIUM 9.2 8.9  AST 111* 160*  ALT 74* 88*  ALKPHOS 500* 524*  BILITOT 1.8* 1.6*   ------------------------------------------------------------------------------------------------------------------ estimated creatinine clearance is 73.8 mL/min (by C-G formula based on SCr of 0.9 mg/dL). ------------------------------------------------------------------------------------------------------------------ No results for input(s): HGBA1C in the last 72 hours. ------------------------------------------------------------------------------------------------------------------ No results for input(s): CHOL, HDL, LDLCALC, TRIG, CHOLHDL, LDLDIRECT in the last 72 hours. ------------------------------------------------------------------------------------------------------------------ No results for input(s): TSH, T4TOTAL, T3FREE, THYROIDAB in the last 72 hours.  Invalid input(s): FREET3 ------------------------------------------------------------------------------------------------------------------ No results for input(s): VITAMINB12, FOLATE, FERRITIN, TIBC, IRON, RETICCTPCT in the last 72 hours.  Coagulation profile  Recent Labs Lab 04/15/17 1508  INR 1.23    No results for  input(s): DDIMER in the last 72 hours.  Cardiac Enzymes No results for input(s): CKMB, TROPONINI, MYOGLOBIN in the last 168 hours.  Invalid input(s): CK ------------------------------------------------------------------------------------------------------------------ Invalid input(s): POCBNP   CBG:  Recent Labs Lab 04/16/17 0030 04/16/17 0728  GLUCAP 89 109*        Studies: Dg Chest 2 View  Result Date: 04/15/2017 CLINICAL DATA:  Altered mental status. Fall. Left parietal hematoma. EXAM: CHEST  2 VIEW COMPARISON:  None. FINDINGS: The heart size is exaggerated by low lung volumes. Pacing wires are stable. There is no edema or effusion. No focal airspace disease is present. Degenerative changes are noted at the shoulders. IMPRESSION: 1. Low lung volumes. 2. No acute cardiopulmonary disease. Electronically Signed   By: San Morelle M.D.   On: 04/15/2017 15:13   Ct Head Wo Contrast  Result Date: 04/15/2017 CLINICAL DATA:  Left parietal hematoma after falling at home. EXAM: CT HEAD WITHOUT CONTRAST TECHNIQUE: Contiguous axial images were obtained from the base of the skull through the vertex without intravenous contrast. COMPARISON:  01/27/2017. FINDINGS: Brain: Diffusely enlarged ventricles and subarachnoid spaces. Patchy white matter low density in both cerebral hemispheres. No intracranial hemorrhage, mass lesion or CT evidence of acute infarction. Vascular: No hyperdense vessel or unexpected calcification. Skull: Normal. Negative for fracture or focal lesion. Sinuses/Orbits: Aplastic frontal sinuses. Stable small amount of bilateral ethmoid air cell opacification. Decreased right mastoid air cell opacification. Other: None. IMPRESSION: 1. No acute abnormality. 2. Stable mild diffuse cerebral and cerebellar atrophy. 3. Stable mild to moderate chronic small vessel white matter ischemic changes in both cerebral hemispheres. 4. Right mastoid air cell opacification with improvement. 5. Stable mild chronic bilateral ethmoid sinusitis. Electronically Signed   By: Claudie Revering M.D.   On: 04/15/2017 15:27      Lab Results  Component Value Date   HGBA1C 7.0 (H) 12/10/2016   HGBA1C 6.2 (H) 03/01/2016   HGBA1C 10.3 (H) 10/26/2013   Lab Results  Component Value Date   LDLCALC 72 05/19/2010   CREATININE 0.90 04/16/2017       Scheduled Meds: .  aspirin  325 mg Oral Daily  . enoxaparin (LOVENOX) injection  40 mg Subcutaneous QHS  . feeding supplement (ENSURE ENLIVE)  237 mL Oral BID BM  . fluticasone  2 spray Each Nare Daily  . furosemide  20 mg Oral Daily  . gentamicin ointment  1 application Topical QHS  . insulin aspart  0-15 Units Subcutaneous TID WC  . insulin aspart  0-5 Units Subcutaneous QHS  . Insulin Lispro Prot & Lispro  8 Units Subcutaneous BID  . lactulose  30 g Oral TID  . levothyroxine  50 mcg Oral QAC breakfast  . metoprolol succinate  25 mg Oral Daily  . modafinil  200 mg Oral Daily  . pantoprazole  80 mg Oral Daily  . spironolactone  50 mg Oral Daily  . ursodiol  500 mg Oral BID   Continuous Infusions: . sodium chloride 100 mL/hr at 04/16/17 0044     LOS: 1 day    Time spent: >30 MINS    Reyne Dumas  [facility] Pager [phone]. If 7PM-7AM, please contact night-coverage at [email or web address], password [facility] 04/16/2017, 8:02 AM  LOS: 1 day

## 2017-04-16 NOTE — Progress Notes (Signed)
Initial Nutrition Assessment  DOCUMENTATION CODES:  Not applicable  INTERVENTION:  To follow nutrition support guidelines for cirrhotics, will order Ensure Enlive po TID (in between meals/before bed), each supplement provides 350 kcal and 20 grams of protein   Can change two of these two snacks next week when able to talk with patient.   Has lost >20% bw in <1 year. If still admitted next week, can follow up to discuss cirrhotic diet to help prevent further weight loss.   NUTRITION DIAGNOSIS:  Increased nutrient needs related to catabolic illness (Cirrhosis) as evidenced by loss of >20% in < 1 year  GOAL:  Patient will meet greater than or equal to 90% of their needs  MONITOR:  PO intake, Supplement acceptance, Labs, Weight trends  REASON FOR ASSESSMENT:  Malnutrition Screening Tool    ASSESSMENT:  56 y/o male PMHx NASH cirrhosis s/p TIPS 01/12/17, ETOH abuse, Afib, COPD, DM on insulin, GERD, HTN, HLD,  sleep apnea on Bipap, CHF, Deafness. Presents with 3-4 days of increased confusion and falls. Had not been taking medications recently. Worked up for elevated ammonia and LFTS. Admitted for metabolic encephalopathy.   RD operating remotely. Pt or wife had reported on MST that pt had poor intake due to decreased appetite and had lost weight unintentionally.   Per chart, their is no specific mention of any poor PO intake other than telephone documentation on 7/25 stating the patient started felt poor 7/24. Had had some gastralgia and poor intake. He has been more confused though, potentially leading to this report of decreased intake  Pt has cirrhosis, CHF and is on multiple diuretics, making trending weight difficult. It appears he was weighing ~140 lbs at time of TIPS procedure. His weight increased dramatically in May and July. Long term, he does exhibit a very large weight loss, if 140 is dry weight, he has lost 50 lbs, or >25% of his BW, since last JUne and 40 lbs, ~22%, since last  September.   At this time, will order Ensure in between meals and before bed in line with cirrhosis nutritional recommendations. Can discuss cirrhotic nutrition support if still admitted next week  NFPE: unable to assess.   Labs: Albumin: 2.5, Ammonia: 79, BG: 90-160, NA wdl Meds: Lactulose, Ensure, Lasix, PPI, rifaximin, Spironolactone, IVF   Recent Labs Lab 04/15/17 1450 04/16/17 0621  NA 140 141  K 4.4 4.0  CL 104 107  CO2 28 25  BUN 28* 24*  CREATININE 1.24 0.90  CALCIUM 9.2 8.9  MG  --  2.0  GLUCOSE 138* 125*   Diet Order:  Diet Carb Modified Fluid consistency: Thin; Room service appropriate? Yes  Skin: Excoriated arms, legs, buttocks  Last BM:  7/28  Height:  Ht Readings from Last 1 Encounters:  04/15/17 5\' 3"  (1.6 m)   Weight:  Wt Readings from Last 1 Encounters:  04/15/17 149 lb 7.6 oz (67.8 kg)   Wt Readings from Last 10 Encounters:  04/15/17 149 lb 7.6 oz (67.8 kg)  04/04/17 168 lb (76.2 kg)  03/22/17 168 lb 12.8 oz (76.6 kg)  02/17/17 167 lb (75.8 kg)  02/08/17 149 lb 11.2 oz (67.9 kg)  01/27/17 138 lb (62.6 kg)  01/11/17 138 lb 11.2 oz (62.9 kg)  01/06/17 138 lb 11.2 oz (62.9 kg)  12/27/16 150 lb (68 kg)  12/23/16 140 lb 10.5 oz (63.8 kg)   Ideal Body Weight:  56.36 kg  BMI:  Body mass index is 26.48 kg/m.  Estimated Nutritional  Needs:  Kcal:  1900-2150 kcals (HBE x1.4-1.6) Protein:  89-102g Pro (1.4-1.6 g/kg bw) Fluid:  Per MD  EDUCATION NEEDS:  No education needs identified at this time  Burtis Junes RD, LDN, [place] [facility] Pager: [phone] 04/16/2017 12:33 PM

## 2017-04-17 ENCOUNTER — Encounter (HOSPITAL_COMMUNITY): Payer: Self-pay

## 2017-04-17 DIAGNOSIS — K7031 Alcoholic cirrhosis of liver with ascites: Secondary | ICD-10-CM

## 2017-04-17 LAB — COMPREHENSIVE METABOLIC PANEL
ALBUMIN: 2.2 g/dL — AB (ref 3.5–5.0)
ALK PHOS: 558 U/L — AB (ref 38–126)
ALT: 140 U/L — ABNORMAL HIGH (ref 17–63)
ANION GAP: 9 (ref 5–15)
AST: 259 U/L — ABNORMAL HIGH (ref 15–41)
BILIRUBIN TOTAL: 1.4 mg/dL — AB (ref 0.3–1.2)
BUN: 22 mg/dL — AB (ref 6–20)
CALCIUM: 8.5 mg/dL — AB (ref 8.9–10.3)
CO2: 26 mmol/L (ref 22–32)
Chloride: 107 mmol/L (ref 101–111)
Creatinine, Ser: 0.91 mg/dL (ref 0.61–1.24)
GFR calc Af Amer: >60 mL/min (ref >60)
GFR calc non Af Amer: >60 mL/min (ref >60)
GLUCOSE: 67 mg/dL (ref 65–99)
Potassium: 3.7 mmol/L (ref 3.5–5.1)
Sodium: 142 mmol/L (ref 135–145)
TOTAL PROTEIN: 5.3 g/dL — AB (ref 6.5–8.1)

## 2017-04-17 LAB — GLUCOSE, CAPILLARY
GLUCOSE-CAPILLARY: 101 mg/dL — AB (ref 65–99)
Glucose-Capillary: 49 mg/dL — ABNORMAL LOW (ref 65–99)
Glucose-Capillary: 168 mg/dL — ABNORMAL HIGH (ref 65–99)
Glucose-Capillary: 126 mg/dL — ABNORMAL HIGH (ref 65–99)

## 2017-04-17 LAB — HEMOGLOBIN A1C
Hgb A1c MFr Bld: 5.8 % — ABNORMAL HIGH (ref 4.8–5.6)
Mean Plasma Glucose: 120 mg/dL

## 2017-04-17 LAB — AMMONIA: Ammonia: 64 umol/L — ABNORMAL HIGH (ref 9–35)

## 2017-04-17 NOTE — Progress Notes (Signed)
PROGRESS NOTE                                                                                                                                                                                                             Patient Demographics:    [name], is a 56 y.o. male, DOB - 04/06/1961, WJX:[id number]  Admit date - 04/15/2017   Admitting Physician Truett Mainland, DO  Outpatient Primary MD for the patient is Petra Kuba, MD  LOS - 2  Outpatient Specialists:None  Chief Complaint  Patient presents with  . 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 Patient admitted for hepatic encephalopathy.   Subjective:   Appears better oriented today but still has some confusion.   Assessment  & Plan :    Principal Problem:   Acute metabolic/ hepatic encephalopathy Hepatic encephalopathy likely in the setting of medication nonadherence. Mental status improving. Transfer to medical floor. Continue lactulose and rifaximin. Consult GI symptoms unimproved.  Active Problems: Chronic decompensated liver cirrhosis Underwent TIPS in April 2018. Has increased abdominal distention, nontender. Ordered for therapeutic paracentesis. Continue Lasix and Aldactone.  Chronic transaminitis  LFTs  Worsened from prior labs. Check liver US. Consult GI .    Type 2 diabetes mellitus (HCC) A1c of 5.8. Monitor on NovoLog 8 units twice a day and sliding scale coverage.  Atrial fibrillation/ flutter Followed  by Dr. Harl Bowie. Of anticoagulation due to history of nosebleeds. Continue aspirin and metoprolol.   Essential hypertension Stable. Continue home medications  Moderate to severe aortic stenosis Asymptomatic.  GERD  continue PPI  OSA Continue nighttime BiPAP      Code Status : Full code   Family Communication  : None at bedside   Disposition Plan  : Home once improved   Barriers For Discharge : Active symptoms   Consults  :  GI   Procedures  :  Ultrasound abdomen  CT head   DVT Prophylaxis  :SCDs  Lab Results  Component Value Date   PLT 241 04/16/2017    Antibiotics  :    Anti-infectives    Start     Dose/Rate Route Frequency Ordered Stop   04/16/17 1000  rifaximin (XIFAXAN) tablet 550 mg     550 mg Oral 2 times daily 04/16/17 0958          Objective:   Vitals:   04/17/17 0500 04/17/17 0600 04/17/17 0700 04/17/17 0800  BP: 101/61 110/72 95/65 105/78  Pulse: 60 78 69 93  Resp: 17 19 16  (!) 23  Temp:    98 F (36.7 C)  TempSrc:    Oral  SpO2: 90% 96% (!) 84% 96%  Weight:      Height:        Wt Readings from Last 3 Encounters:  04/15/17 67.8 kg (149 lb 7.6 oz)  04/04/17 76.2 kg (168 lb)  03/22/17 76.6 kg (168 lb 12.8 oz)     Intake/Output Summary (Last 24 hours) at 04/17/17 1203 Last data filed at 04/16/17 2000  Gross per 24 hour  Intake              477 ml  Output              850 ml  Net             -373 ml     Physical Exam  Gen: not in distress HEENT:  moist mucosa, supple neck Chest: clear b/l, no added sounds CVS: N S1&S2, no murmurs, rubs or gallop GI: soft, Moderate distention with ascites and  protuberant umbilicus, and tender, bowel sounds present Musculoskeletal  : warm, no edema CNS: AAOX2, mild flapping tremors    Data Review:    CBC  Recent Labs Lab 04/15/17 1450 04/16/17 0621  WBC 8.7 7.4  HGB 13.5 13.1  HCT 41.8 39.6  PLT 255 241  MCV 99.8 100.0  MCH 32.2 33.1  MCHC 32.3 33.1  RDW 17.6* 17.6*    LYMPHSABS 1.0  --   MONOABS 1.0  --   EOSABS 0.1  --   BASOSABS 0.1  --     Chemistries   Recent Labs Lab 04/15/17 1450 04/16/17 0621 04/17/17 0434  NA 140 141 142  K 4.4 4.0 3.7  CL 104 107 107  CO2 28 25 26   GLUCOSE 138* 125* 67  BUN 28* 24* 22*  CREATININE 1.24 0.90 0.91  CALCIUM 9.2 8.9 8.5*  MG  --  2.0  --   AST 111* 160* 259*  ALT 74* 88* 140*  ALKPHOS 500* 524* 558*  BILITOT 1.8* 1.6* 1.4*   ------------------------------------------------------------------------------------------------------------------ No results for input(s): CHOL, HDL, LDLCALC, TRIG, CHOLHDL, LDLDIRECT in the last 72 hours.  Lab Results  Component Value Date   HGBA1C 5.8 (H) 04/16/2017   ------------------------------------------------------------------------------------------------------------------ No results for input(s): TSH, T4TOTAL, T3FREE, THYROIDAB in the last 72 hours.  Invalid input(s): FREET3 ------------------------------------------------------------------------------------------------------------------ No results for input(s): VITAMINB12, FOLATE, FERRITIN, TIBC, IRON, RETICCTPCT in the last 72 hours.  Coagulation profile  Recent Labs Lab 04/15/17 1508  INR 1.23    No results for input(s): DDIMER in the last 72 hours.  Cardiac Enzymes No results for input(s): CKMB, TROPONINI, MYOGLOBIN in the last 168 hours.  Invalid input(s): CK ------------------------------------------------------------------------------------------------------------------ No results found for: BNP  Inpatient Medications  Scheduled Meds: . aspirin  325 mg Oral Daily  . enoxaparin (LOVENOX) injection  40 mg Subcutaneous QHS  . feeding supplement (ENSURE ENLIVE)  237  mL Oral TID BM  . fluticasone  2 spray Each Nare Daily  . furosemide  20 mg Oral Daily  . gentamicin ointment  1 application Topical QHS  . insulin aspart  0-15 Units Subcutaneous TID WC  . insulin aspart  0-5 Units  Subcutaneous QHS  . insulin aspart protamine- aspart  8 Units Subcutaneous BID WC  . lactulose  30 g Oral TID  . levothyroxine  50 mcg Oral QAC breakfast  . metoprolol succinate  25 mg Oral Daily  . modafinil  200 mg Oral Daily  . pantoprazole  80 mg Oral Daily  . rifaximin  550 mg Oral BID  . spironolactone  50 mg Oral Daily  . ursodiol  600 mg Oral BID   Continuous Infusions: PRN Meds:.acetaminophen **OR** acetaminophen, acetaminophen, albuterol, ondansetron **OR** ondansetron (ZOFRAN) IV  Micro Results Recent Results (from the past 240 hour(s))  MRSA PCR Screening     Status: None   Collection Time: 04/15/17 11:27 PM  Result Value Ref Range Status   MRSA by PCR NEGATIVE NEGATIVE Final    Comment:        The GeneXpert MRSA Assay (FDA approved for NASAL specimens only), is one component of a comprehensive MRSA colonization surveillance program. It is not intended to diagnose MRSA infection nor to guide or monitor treatment for MRSA infections.     Radiology Reports Dg Chest 2 View  Result Date: 04/15/2017 CLINICAL DATA:  Altered mental status. Fall. Left parietal hematoma. EXAM: CHEST  2 VIEW COMPARISON:  None. FINDINGS: The heart size is exaggerated by low lung volumes. Pacing wires are stable. There is no edema or effusion. No focal airspace disease is present. Degenerative changes are noted at the shoulders. IMPRESSION: 1. Low lung volumes. 2. No acute cardiopulmonary disease. Electronically Signed   By: San Morelle M.D.   On: 04/15/2017 15:13   Ct Head Wo Contrast  Result Date: 04/15/2017 CLINICAL DATA:  Left parietal hematoma after falling at home. EXAM: CT HEAD WITHOUT CONTRAST TECHNIQUE: Contiguous axial images were obtained from the base of the skull through the vertex without intravenous contrast. COMPARISON:  01/27/2017. FINDINGS: Brain: Diffusely enlarged ventricles and subarachnoid spaces. Patchy white matter low density in both cerebral hemispheres. No  intracranial hemorrhage, mass lesion or CT evidence of acute infarction. Vascular: No hyperdense vessel or unexpected calcification. Skull: Normal. Negative for fracture or focal lesion. Sinuses/Orbits: Aplastic frontal sinuses. Stable small amount of bilateral ethmoid air cell opacification. Decreased right mastoid air cell opacification. Other: None. IMPRESSION: 1. No acute abnormality. 2. Stable mild diffuse cerebral and cerebellar atrophy. 3. Stable mild to moderate chronic small vessel white matter ischemic changes in both cerebral hemispheres. 4. Right mastoid air cell opacification with improvement. 5. Stable mild chronic bilateral ethmoid sinusitis. Electronically Signed   By: Claudie Revering M.D.   On: 04/15/2017 15:27    Time Spent in minutes  25   Louellen Molder M.D on 04/17/2017 at 12:03 PM  Between 7am to 7pm - Pager - [phone]  After 7pm go to [email or web address] - password [facility]  [facility] -  Office  [phone]

## 2017-04-18 ENCOUNTER — Inpatient Hospital Stay (HOSPITAL_COMMUNITY): Payer: Medicare Other

## 2017-04-18 DIAGNOSIS — R7989 Other specified abnormal findings of blood chemistry: Secondary | ICD-10-CM

## 2017-04-18 LAB — COMPREHENSIVE METABOLIC PANEL
ALK PHOS: 633 U/L — AB (ref 38–126)
ALT: 138 U/L — ABNORMAL HIGH (ref 17–63)
ANION GAP: 9 (ref 5–15)
AST: 219 U/L — ABNORMAL HIGH (ref 15–41)
Albumin: 2.5 g/dL — ABNORMAL LOW (ref 3.5–5.0)
BILIRUBIN TOTAL: 1.7 mg/dL — AB (ref 0.3–1.2)
BUN: 26 mg/dL — ABNORMAL HIGH (ref 6–20)
CALCIUM: 8.4 mg/dL — AB (ref 8.9–10.3)
CO2: 27 mmol/L (ref 22–32)
Chloride: 104 mmol/L (ref 101–111)
Creatinine, Ser: 1 mg/dL (ref 0.61–1.24)
Glucose, Bld: 101 mg/dL — ABNORMAL HIGH (ref 65–99)
Potassium: 3.9 mmol/L (ref 3.5–5.1)
SODIUM: 140 mmol/L (ref 135–145)
TOTAL PROTEIN: 5.6 g/dL — AB (ref 6.5–8.1)

## 2017-04-18 LAB — PROTIME-INR
INR: 1.26
PROTHROMBIN TIME: 15.9 s — AB (ref 11.4–15.2)

## 2017-04-18 LAB — GLUCOSE, CAPILLARY
GLUCOSE-CAPILLARY: 96 mg/dL (ref 65–99)
GLUCOSE-CAPILLARY: 104 mg/dL — AB (ref 65–99)
Glucose-Capillary: 160 mg/dL — ABNORMAL HIGH (ref 65–99)
Glucose-Capillary: 127 mg/dL — ABNORMAL HIGH (ref 65–99)

## 2017-04-18 MED ORDER — ALBUMIN HUMAN 25 % IV SOLN
50.0000 g | Freq: Once | INTRAVENOUS | Status: AC
  Administered 2017-04-18: 50 g via INTRAVENOUS
  Filled 2017-04-18: qty 200

## 2017-04-18 MED ORDER — PANTOPRAZOLE SODIUM 40 MG PO TBEC
40.0000 mg | DELAYED_RELEASE_TABLET | Freq: Every day | ORAL | Status: DC
  Administered 2017-04-19 – 2017-04-21 (×3): 40 mg via ORAL
  Filled 2017-04-18 (×3): qty 1

## 2017-04-18 NOTE — Care Management Note (Signed)
Case Management Note  Patient Details  Name: [name] MRN: [id number] Date of Birth: 1961/05/02  Subjective/Objective:                  Pt admitted with encephalopathy. He is from home, lives with his wife. He has WC, walker and hospital bed pta. He has home oxygen, cpap, and neb machine through [employer]. Pt would like HH and request [facility]. Pt/wife aware HH has 48hrs to make first visit. They communicate no other needs.   Action/Plan: Pt will DC home with [facility] nursing, PT, aid. Vaughan Basta, [facility] rep, aware of referral and will obtain pt info from chart. Anticipate DC home in next 24 hrs. CM will cont to follow to DC and verify DC date with [facility].  Expected Discharge Date:       04/19/2017           Expected Discharge Plan:  [place]  In-House Referral:  NA  Discharge planning Services  CM Consult  Post Acute Care Choice:  Home Health Choice offered to:  Patient, Spouse  HH Arranged:  RN, PT, Nurse's Aide [place] Agency:  [place]  Status of Service:  Completed, signed off  Sherald Barge, RN 04/18/2017, 3:29 PM

## 2017-04-18 NOTE — Consult Note (Signed)
Referring Provider: Dr. Clementeen Graham  Primary Care Physician:  Petra Kuba, MD Primary Gastroenterologist:  Dr. Gala Romney   Date of Admission: 04/15/17 Date of Consultation: 04/18/17  Reason for Consultation: cirrhosis, history of TIPS, encephalopathy   HPI:  [name] is a 56 y.o. year old male well known to our practice with a history of NASH cirrhosis, ETOH use, treated empirically for AMA negative PBC starting approximately Nov 2017. Underwent TIPS utilizing a gun-sight technique on 01/12/17. Surveillance ultrasound 5/22 negativefor evidence of TIPS dysfunction,thoughdoes demonstrate pulsatile flow throughout the hepatic venous system, TIPS and IVC, as could be seen in the setting of right-sided heart failure. Has remained on low dose Lasix 20 mg and aldactone 50 mg daily since TIPS without evidence for encephalopathy as an outpatient, recently seen March 22, 2017 as outpatient.  Presented to the ED with increasing confusion, non-compliance with medications despite wife attempting to encourage him to take (obtained from H&P in epic). Wife not at bedside at time of consultation. [place] prior to admission and day of admission. Head CT negative. Ammonia level in 80s. CT head negative. Baseline alk phos in 4-500 range chronically, with mild increase in alk phos to 600s today. Transaminases increased from baseline during hospitalization.   Denies abdominal pain. Confusion noted today, unsure the year or city. Knows my name. I contacted Pam, his wife, who recounted information prior to admission to me. States he fell Friday. She states that the Urso tablets have been on the floor, on the table. She is unsure if he has taken any if at all. She then states that now she realizes he was constipated prior to coming to the hospital, stating it "hurt to go to the bathroom" and she thought it was related to urinary issues but then realized he had not been having bowel movements.   Past Medical History:   Diagnosis Date  . Alcohol use   . Anemia   . Aortic stenosis    mild  . Arthritis   . Atrial flutter ([place])   . Benign hypertrophy of prostate    with urinary retention; repaired hypospadia; transurethral resection of the prostate   . Cardiac conduction disorder    status post pacemaker implantation in 1995 generator replaced 2005  . Chest pain 2007, 2009   cardiac cath > normal coronary arterie; nl left ventricular function  . CHF (congestive heart failure) ([place])   . Cirrhosis ([place]) 11/2016  . Congenital deafness   . COPD (chronic obstructive pulmonary disease) ([place])   . Diabetes mellitus    +Insulin  . Dyspnea   . Edema   . GERD (gastroesophageal reflux disease)    status post multiple dilatations for stricture  . 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 Hepatitis    Thinks it was B  . History of kidney stones   . HTN (hypertension)   . Hyperlipidemia    statin discontinued due to abn LFTs  . Hypothyroidism   . Mitral regurgitation    insignificant  . Obesity   . Obesity 4/08   BMI= 40  . Pelvic mass    identified in 2009, stable 2010  . PONV (postoperative nausea and vomiting)   . Presence of permanent cardiac pacemaker   . Sleep apnea    BiPAP and continuous oxygen  . Syncope   . Thyroid disease     Past Surgical History:  Procedure Laterality Date  .  A-V CARDIAC PACEMAKER INSERTION  1995  . COLONOSCOPY  2008  . COLONOSCOPY N/A 06/23/2015   [id number]:[id number] diverticulosis  . ESOPHAGOGASTRODUODENOSCOPY N/A 06/23/2015   KXF:GHWE portal gastropathy  . IR GENERIC HISTORICAL  12/14/2016   IR PARACENTESIS 12/14/2016 Sandi Mariscal, MD [facility]INTER[facility]  . IR GENERIC HISTORICAL  12/14/2016   IR TIPS 12/14/2016 Sandi Mariscal, MD [facility]  . IR PARACENTESIS  01/12/2017  . IR RADIOLOGIST EVAL & MGMT  01/06/2017  . IR RADIOLOGIST EVAL & MGMT  02/08/2017  . IR RADIOLOGIST EVAL & MGMT  11/30/2016  . IR TIPS  01/12/2017  .  NASAL SINUS SURGERY     partially removed  . ORCHIECTOMY  1990   bilateral; ?neoplasm  . PACEMAKER INSERTION  05/2004; 10/04/2013   MDT dual chamber pacemaker; gen change 10/04/2013 (MDT ADDRL1)  . PERMANENT PACEMAKER GENERATOR CHANGE N/A 10/04/2013   Procedure: PERMANENT PACEMAKER GENERATOR CHANGE;  Surgeon: Evans Lance, MD;  Location: [facility] CATH LAB;  Service: Cardiovascular;  Laterality: N/A;  . RADIOLOGY WITH ANESTHESIA N/A 12/14/2016   Procedure: TIPS;  Surgeon: Sandi Mariscal, MD;  Location: [place];  Service: Radiology;  Laterality: N/A;  . RADIOLOGY WITH ANESTHESIA N/A 01/12/2017   Procedure: TIPS;  Surgeon: Sandi Mariscal, MD;  Location: [place];  Service: Radiology;  Laterality: N/A;  . REPAIR HYPOSPADIAS W/ URETHROPLASTY    . TONSILLECTOMY AND ADENOIDECTOMY    . TRANSURETHRAL RESECTION OF PROSTATE     for BPH    Prior to Admission medications   Medication Sig Start Date End Date Taking? Authorizing Provider  acetaminophen (TYLENOL) 325 MG tablet Take 650 mg by mouth every 6 (six) hours as needed for mild pain.   Yes [provider]  albuterol (PROVENTIL HFA;VENTOLIN HFA) 108 (90 Base) MCG/ACT inhaler Inhale 2 puffs into the lungs every 6 (six) hours as needed for wheezing or shortness of breath.   Yes [provider]  albuterol (PROVENTIL) (2.5 MG/3ML) 0.083% nebulizer solution Take 2.5 mg by nebulization every 6 (six) hours as needed for wheezing or shortness of breath.   Yes [provider]  aspirin 325 MG EC tablet Take 325 mg by mouth daily.   Yes [provider]  cetirizine (ZYRTEC) 10 MG tablet Take 10 mg by mouth daily.   Yes [provider]  furosemide (LASIX) 20 MG tablet Take 1 tablet (20 mg total) by mouth daily. 03/22/17  Yes Annitta Needs, NP  gentamicin ointment (GARAMYCIN) 0.1 % Apply 1 application topically at bedtime. Inside nose    Yes [provider]  Glucosamine-Chondroit-Vit C-Mn (GLUCOSAMINE CHONDR 1500 COMPLX PO) Take 1  tablet by mouth every morning.   Yes [provider]  HUMALOG MIX 75/25 KWIKPEN (75-25) 100 UNIT/ML Kwikpen Inject 8 Units into the skin 2 (two) times daily. 15 units in the morning and 15 units in the evening Patient taking differently: Inject 0-8 Units into the skin 2 (two) times daily. Takes 8 units every morning. Takes a dose in the evening using a sliding scale. 4units in evening 11/14/16  Yes Eber Jones, MD  lactulose (CHRONULAC) 10 GM/15ML solution 10cc (2 teaspoons) bid; Titrate to 2-3 semi formed stools a day Patient taking differently: Take 6.7 g by mouth 2 (two) times daily. 10cc (2 teaspoons) bid; Titrate to 2-3 semi formed stools a day 11/14/16  Yes Eber Jones, MD  levothyroxine (LEVOXYL) 50 MCG tablet Take 50 mcg by mouth daily before breakfast.    Yes [provider]  metoprolol succinate (TOPROL-XL) 25 MG 24 hr tablet Take 25 mg by mouth daily.   Yes [provider]  modafinil (PROVIGIL) 200 MG tablet Take 200 mg by mouth daily.  01/13/15  Yes [provider]  mometasone (NASONEX) 50 MCG/ACT nasal spray Place 2 sprays into the nose daily as needed (FOR CONGESTION/ALLERGIES).   Yes [provider]  Multiple Vitamins-Minerals (CENTRUM) tablet Take 1 tablet by mouth daily.     Yes [provider]  nitroGLYCERIN (NITROSTAT) 0.4 MG SL tablet Place 1 tablet (0.4 mg total) under the tongue every 5 (five) minutes as needed for chest pain. 06/26/15  Yes Lendon Colonel, NP  NON FORMULARY Bipap 16.0/e   Yes [provider]  Omega-3 Fatty Acids (FISH OIL) 1000 MG CAPS Take 1,000 mg by mouth every other day.    Yes [provider]  omeprazole (PRILOSEC) 40 MG capsule TAKE 1 CAPSULE BY MOUTH ONCE DAILY FOR REFLUX. 04/11/17  Yes Branch, Alphonse Guild, MD  spironolactone (ALDACTONE) 50 MG tablet Take 1 tablet (50 mg total) by mouth daily. 03/22/17  Yes Annitta Needs, NP  ursodiol (ACTIGALL) 500 MG tablet TAKE 1  TABLET BY MOUTH TWICE DAILY 08/11/16  Yes Carlis Stable, NP    Current Facility-Administered Medications  Medication Dose Route Frequency Provider Last Rate Last Dose  . acetaminophen (TYLENOL) tablet 650 mg  650 mg Oral Q6H PRN Truett Mainland, DO       Or  . acetaminophen (TYLENOL) suppository 650 mg  650 mg Rectal Q6H PRN Truett Mainland, DO      . acetaminophen (TYLENOL) tablet 650 mg  650 mg Oral Q6H PRN Truett Mainland, DO      . albuterol (PROVENTIL) (2.5 MG/3ML) 0.083% nebulizer solution 3 mL  3 mL Inhalation Q6H PRN Truett Mainland, DO      . aspirin EC tablet 325 mg  325 mg Oral Daily Truett Mainland, DO   325 mg at 04/17/17 3335  . enoxaparin (LOVENOX) injection 40 mg  40 mg Subcutaneous QHS Truett Mainland, DO   40 mg at 04/17/17 2050  . feeding supplement (ENSURE ENLIVE) (ENSURE ENLIVE) liquid 237 mL  237 mL Oral TID BM Reyne Dumas, MD   237 mL at 04/17/17 2000  . fluticasone (FLONASE) 50 MCG/ACT nasal spray 2 spray  2 spray Each Nare Daily Truett Mainland, DO   2 spray at 04/17/17 4562  . furosemide (LASIX) tablet 20 mg  20 mg Oral Daily Truett Mainland, DO   20 mg at 04/17/17 5638  . gentamicin ointment (GARAMYCIN) 0.1 % 1 application  1 application Topical QHS Truett Mainland, DO   1 application at 93/73/42 2051  . insulin aspart (novoLOG) injection 0-15 Units  0-15 Units Subcutaneous TID WC Truett Mainland, DO   3 Units at 04/17/17 1208  . insulin aspart (novoLOG) injection 0-5 Units  0-5 Units Subcutaneous QHS Stinson, Jacob J, DO      . insulin aspart protamine- aspart (NOVOLOG MIX 70/30) injection 8 Units  8 Units Subcutaneous BID WC Reyne Dumas, MD   8 Units at 04/17/17 1700  . lactulose (CHRONULAC) 10 GM/15ML solution 30 g  30 g Oral TID Truett Mainland, DO   30 g at 04/17/17 2051  . levothyroxine (SYNTHROID, LEVOTHROID) tablet 50 mcg  50 mcg Oral QAC breakfast Truett Mainland, DO   50 mcg at 04/17/17 8768  . metoprolol succinate (TOPROL-XL)  24 hr tablet 25  mg  25 mg Oral Daily Truett Mainland, DO   25 mg at 04/17/17 9381  . modafinil (PROVIGIL) tablet 200 mg  200 mg Oral Daily Truett Mainland, DO   200 mg at 04/17/17 0175  . ondansetron (ZOFRAN) tablet 4 mg  4 mg Oral Q6H PRN Truett Mainland, DO       Or  . ondansetron [facility]) injection 4 mg  4 mg Intravenous Q6H PRN Truett Mainland, DO   4 mg at 04/16/17 1657  . pantoprazole (PROTONIX) EC tablet 80 mg  80 mg Oral Daily Truett Mainland, DO   80 mg at 04/17/17 1025  . rifaximin (XIFAXAN) tablet 550 mg  550 mg Oral BID Reyne Dumas, MD   550 mg at 04/17/17 2052  . spironolactone (ALDACTONE) tablet 50 mg  50 mg Oral Daily Truett Mainland, DO   50 mg at 04/17/17 8527  . ursodiol (ACTIGALL) capsule 600 mg  600 mg Oral BID Reyne Dumas, MD   600 mg at 04/17/17 2052    Allergies as of 04/15/2017 - Review Complete 04/15/2017  Allergen Reaction Noted  . Penicillins Swelling   . Enalapril Cough 04/15/2011  . Metformin and related Diarrhea 07/30/2013  . Eliquis [apixaban] Other (See Comments) 03/18/2014    Family History  Problem Relation Age of Onset  . COPD Mother   . Hypertension Mother   . Congestive Heart Failure Mother   . Pneumonia Father   . Heart disease Other   . Hypertension Other   . Colon cancer Neg Hx     Social History   Social History  . Marital status: Married    Spouse name: N/A  . Number of children: N/A  . Years of education: N/A   Occupational History  . Not on file.   Social History Main Topics  . Smoking status: Never Smoker  . Smokeless tobacco: Never Used     Comment: tobacco use- no   . Alcohol use No     Comment: Hx. of alcohol use  . Drug use: No  . Sexual activity: Yes   Other Topics Concern  . Not on file   Social History Narrative   Part time.     Review of Systems: Unable to obtain due to mental status.   Physical Exam: Vital signs in last 24 hours: Temp:  [98.2 F (36.8 C)-98.4 F (36.9 C)] 98.3 F (36.8 C) (07/30  0603) Pulse Rate:  [61-96] 64 (07/30 0940) Resp:  [16-20] 16 (07/30 0603) BP: (95-114)/(57-72) 106/72 (07/30 0603) SpO2:  [93 %-98 %] 96 % (07/30 0940) Last BM Date: 04/17/17 General:   Alert, appears chronically ill, knows my name but unable to relate the year or city.  Head:  Normocephalic and atraumatic. Eyes:  Sclera clear, no icterus.   Ears:  Hard of hearing  Nose:  No deformity, discharge,  or lesions. Mouth:  No deformity or lesions Lungs:  Clear throughout to auscultation.   Heart:  S1 S2 present with systolic murmur  Abdomen:  +BS, distended but soft, appears close to baseline as outpatient. Protruding small umbilical hernia. No rebound or guarding. Rectal:  Deferred Msk:  Symmetrical without gross deformities.  Extremities:  With trace lower extremity edema  Neurologic:  Alert and oriented to person only. No asterixis on exam  Psych:  Alert and cooperative. Normal mood and affect.  Intake/Output from previous day: 07/29 0701 - 07/30 0700 In: 480 [P.O.:480] Out: -  Intake/Output this shift: Total I/O In: 240 [P.O.:240] Out: -   Lab Results:  Recent Labs  04/15/17 1450 04/16/17 0621  WBC 8.7 7.4  HGB 13.5 13.1  HCT 41.8 39.6  PLT 255 241   BMET  Recent Labs  04/16/17 0621 04/17/17 0434 04/18/17 0630  NA 141 142 140  K 4.0 3.7 3.9  CL 107 107 104  CO2 _0 GLUCOSE 125* 67 101*  BUN 24* 22* 26*  CREATININE 0.90 0.91 1.00  CALCIUM 8.9 8.5* 8.4*   LFT  Recent Labs  04/16/17 0621 04/17/17 0434 04/18/17 0630  PROT 5.7* 5.3* 5.6*  ALBUMIN 2.5* 2.2* 2.5*  AST 160* 259* 219*  ALT 88* 140* 138*  ALKPHOS 524* 558* 633*  BILITOT 1.6* 1.4* 1.7*   PT/INR  Recent Labs  04/15/17 1508  LABPROT 15.6*  INR 1.23    Studies/Results: [name] Art/ven Flow Abd Pelv Doppler  Result Date: 04/18/2017 CLINICAL DATA:  57 year old male with recurrent ascites and prior TIPS EXAM: DUPLEX ULTRASOUND OF LIVER TECHNIQUE: Color and duplex Doppler ultrasound was  performed to evaluate TIPS and the hepatic vessels. COMPARISON:  02/17/2017 FINDINGS: Portal Vein Velocities Main:  91 cm/sec TIPS: Velocity measured between 144 centimeter/second and 174 centimeter/second. Hepatic Vein Velocities Right:  59 cm/sec Middle:  67 cm/sec Left:  54 cm/sec Hepatic Artery Velocity:  86 cm/sec Splenic Vein Velocity:  38 cm/sec Varices: None visualize Ascites: Small volume ascites IMPRESSION: Duplex ultrasound demonstrates patent TIPS. Small volume ascites persists. Electronically Signed   By: Corrie Mckusick D.O.   On: 04/18/2017 13:14   US Abdomen Limited Ruq  Result Date: 04/18/2017 CLINICAL DATA:  Ascites. EXAM: LIMITED ABDOMEN ULTRASOUND FOR ASCITES TECHNIQUE: Limited ultrasound survey for ascites was performed in all four abdominal quadrants. COMPARISON:  02/17/2017. FINDINGS: Mild ascites noted. No large enough fluid collection noted for safe paracentesis. Paracentesis not performed . IMPRESSION: Mild ascites.  Paracentesis not performed. Electronically Signed   By: Marcello Moores  Register   On: 04/18/2017 12:06    Impression: 56 year old male with extensive medical history to include NASH cirrhosis, empiric treatment for AMA negative PBC on Urso,  s/p TIPS in April 2018, presenting with encephalopathy in setting of non-compliant outpatient therapy with lactulose. After speaking with his wife, he has been skipping medications, and it appears he has not had scheduled dosing of Urso on a regular basis as outpatient for at least the last 2 weeks. Encouragingly, TIPS remains patent, as this was assessed via duplex ultrasound today. Mild ascites noted on ultrasound. He has done well without need for paracentesis since TIPS procedure on low dose lasix 20 mg and aldactone 50 mg, and up until this point has done well without encephalopathic events. Discussed with wife need for home health and close monitoring at home. He is still confused today but physically appears at his baseline.  Cognitively, he is not at baseline yet. Discussed with nursing staff strict I/Os. Lactulose dosing was given late today, so he has only had 1 BM thus far. Elevated LFTs noted and increased from baseline, likely multifactorial in this setting.   Plan: Lactulose dosing titrated to achieve 3 soft BMs daily Continue Xifaxan BID Lasix 20 mg and aldactone 50 mg daily PPI daily Continue weight-based urso dosing: discussed with wife need to strictly follow this at home. Strict I/O Check INR Recommend Home health: I am concerned regarding outpatient compliance, as noted above in HPI.  Annitta Needs, PhD, ANP-BC [facility]  LOS: 3 days    04/18/2017, 1:19 PM

## 2017-04-18 NOTE — Evaluation (Signed)
Physical Therapy Evaluation Patient Details Name: [name] MRN: [id number] DOB: 24-Jul-1961 Today's Date: 04/18/2017   History of Present Illness  [name] is a 56yo male, PMH: NASH cirrhosis, ETOH use, s/p TIPS on 01/12/17, AF, COPD, IDDM2, GERD, hypothyroidism, essential hypertension, OSA on BiPAP. Pt comes to [facility] on 7/27 after 3-4 days progressive AMS, c progressive gait instability and fall at home. Baseline mobility with RW or WC. LFT eklevated NH3: 80s  Clinical Impression  Pt admitted with above diagnosis. Pt currently with functional limitations due to the deficits listed below (see "PT Problem List"). Upon entry, the patient is received semirecumbent in bed, wife present, who provides a narrative for the HPI and baseline level of function. The pt is awake and agreeable to participate. He is minimally verbally responsive, and when he speaks has some moderate dysarthria, unclear if this is is baseline. VSS during eval on 3LPM (baseline flowrate.) The patient requires minimal physical assistance to come from supine to sitting, but he performs transfers and ambulation at supervision level s A/E, with some mild wide-based gait ataxia, although no patent LOB. He often uses A/E for mobility at home. The patient is at high risk for falls as evidence by gait speed <1.72m/s and forward reach <5".He reportedly feels weaker than his baseline. Pt will benefit from skilled PT intervention to increase independence and safety with basic mobility in preparation for discharge to the venue listed below.       Follow Up Recommendations Home health PT;Supervision/Assistance - 24 hour    Equipment Recommendations  None recommended by PT    Recommendations for Other Services       Precautions / Restrictions Precautions Precautions: Fall Precaution Comments: no falls score  Restrictions Weight Bearing Restrictions: No      Mobility  Bed Mobility Overal bed mobility: Needs Assistance Bed  Mobility: Supine to Sit;Sit to Supine     Supine to sit: Min assist Sit to supine: Modified independent (Device/Increase time)   General bed mobility comments: hand held assistance to come to sitting  Transfers Overall transfer level: Needs assistance Equipment used: None Transfers: Sit to/from Stand Sit to Stand: Supervision         General transfer comment: performs 5xSTS in ~35s, hands for stability on bed, but not list assistance. cautious and minimally unsteady   Ambulation/Gait Ambulation/Gait assistance: Supervision Ambulation Distance (Feet): 60 Feet Assistive device: None     Gait velocity interpretation: <1.8 ft/sec, indicative of risk for recurrent falls General Gait Details: wide based gait, speed appropriate for hosehodl AMB, no LOB with turns.   Stairs            Wheelchair Mobility    Modified Rankin (Stroke Patients Only)       Balance Overall balance assessment: History of Falls;Modified Independent                                           Pertinent Vitals/Pain Pain Assessment: No/denies pain    Home Living Family/patient expects to be discharged to:: Private residence Living Arrangements: Spouse/significant other Available Help at Discharge: Family;Available 24 hours/day Type of Home: House Home Access: Ramped entrance     Home Layout: One level Home Equipment: Walker - 2 wheels;Bedside commode;Shower seat;Wheelchair - manual;Cane - quad;Hospital bed      Prior Function Level of Independence: Independent;Independent with assistive device(s)  Hand Dominance        Extremity/Trunk Assessment   Upper Extremity Assessment Upper Extremity Assessment: Generalized weakness;Overall [facility] for tasks assessed    Lower Extremity Assessment Lower Extremity Assessment: Generalized weakness;Overall WFL for tasks assessed       Communication   Communication: No difficulties  Cognition  Arousal/Alertness: Awake/alert Behavior During Therapy: WFL for tasks assessed/performed Overall Cognitive Status: Impaired/Different from baseline (per wife in room; improved but not baseline)                                        General Comments      Exercises     Assessment/Plan    PT Assessment Patient needs continued PT services  PT Problem List Decreased strength;Decreased mobility;Decreased balance;Decreased activity tolerance       PT Treatment Interventions Therapeutic exercise;Balance training;Therapeutic activities;Functional mobility training;Cognitive remediation;Patient/family education    PT Goals (Current goals can be found in the Care Plan section)  Acute Rehab PT Goals Patient Stated Goal: improve safety and reduce falls risk  PT Goal Formulation: With family Time For Goal Achievement: 05/02/17 Potential to Achieve Goals: Good    Frequency Min 2X/week   Barriers to discharge        Co-evaluation               AM-PAC PT "6 Clicks" Daily Activity  Outcome Measure Difficulty turning over in bed (including adjusting bedclothes, sheets and blankets)?: Total Difficulty moving from lying on back to sitting on the side of the bed? : Total Difficulty sitting down on and standing up from a chair with arms (e.g., wheelchair, bedside commode, etc,.)?: A Lot Help needed moving to and from a bed to chair (including a wheelchair)?: A Lot Help needed walking in hospital room?: A Lot Help needed climbing 3-5 steps with a railing? : A Lot 6 Click Score: 10    End of Session Equipment Utilized During Treatment: Gait belt;Oxygen Activity Tolerance: Patient tolerated treatment well Patient left: in bed;with bed alarm set;with call bell/phone within reach;with nursing/sitter in room Nurse Communication: Mobility status PT Visit Diagnosis: Unsteadiness on feet (R26.81);Other abnormalities of gait and mobility (R26.89);Other symptoms and signs  involving the nervous system (R29.898)    Time: 9622-2979 PT Time Calculation (min) (ACUTE ONLY): 14 min   Charges:   PT Evaluation $PT Eval Moderate Complexity: 1 Mod     PT G Codes:        12:50 PM, 2017-05-02 Etta Grandchild, PT, DPT Physical Therapist - [place] [phone] (Office)    Buccola,Allan C 05-02-17, 12:43 PM

## 2017-04-18 NOTE — Progress Notes (Signed)
PROGRESS NOTE                                                                                                                                                                                                             Patient Demographics:    [name], is a 56 y.o. male, DOB - [birth year], EGB:[id number]  Admit date - 04/15/2017   Admitting Physician Truett Mainland, DO  Outpatient Primary MD for the patient is Petra Kuba, MD  LOS - 3  Outpatient Specialists:None  Chief Complaint  Patient presents with  . 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 Patient admitted for hepatic encephalopathy.   Subjective:   Appears slightly better oriented from yesterday. Still has some confusion.   Assessment  & Plan :    Principal Problem:   Acute metabolic/ hepatic encephalopathy Hepatic encephalopathy likely in the setting of medication nonadherence. Mental status Slowly improving but still has some tremors. Continue lactulose and rifaximin.Marland Kitchen Spoke with GI consult. His mental status seems close to his baseline. Follow further recommendations.   Active Problems: Chronic decompensated liver cirrhosis Underwent TIPS in April 2018. Has increased abdominal distention, nontender. Sent for therapeutic paracenteses but ultrasound abdomen shows only mild ascites so it was not done. Continue Lasix and Aldactone.  Chronic transaminitis  Worsened LFTs from prior. Abdominal Doppler shows patent  TIPS. Will continue to monitor.    Type 2 diabetes mellitus (HCC) A1c of 5.8. Monitor on NovoLog 8 units twice a day and sliding scale coverage.  Atrial fibrillation/ flutter Followed by Dr. Harl Bowie. Off anticoagulation due to history of nosebleeds. Continue aspirin and metoprolol.   Essential hypertension Stable. Continue home medications  Moderate to severe aortic stenosis Asymptomatic.  GERD  continue PPI  OSA Continue nighttime BiPAP      Code Status : Full code   Family Communication  : None at bedside   Disposition Plan  : PT recommends home health was 24-hour supervision. Home  possibly in the next 24-48 hours if encephalopathy further improves and LFTs trending down.  Barriers For Discharge : Active symptoms   Consults  :  GI   Procedures  :  Ultrasound abdomen  Abdominal Doppler CT head   DVT Prophylaxis  :SCDs   Lab Results  Component Value Date   PLT 241 04/16/2017    Antibiotics  :    Anti-infectives    Start     Dose/Rate Route Frequency Ordered Stop   04/16/17 1000  rifaximin (XIFAXAN) tablet 550 mg     550 mg Oral 2 times daily 04/16/17 0958          Objective:   Vitals:   04/17/17 2020 04/17/17 2324 04/18/17 0603 04/18/17 0940  BP: 114/72  106/72   Pulse: 96 75 95 64  Resp: 20 16 16    Temp: 98.2 F (36.8 C)  98.3 F (36.8 C)   TempSrc: Oral  Oral   SpO2: 93% 94% 98% 96%  Weight:      Height:        Wt Readings from Last 3 Encounters:  04/15/17 67.8 kg (149 lb 7.6 oz)  04/04/17 76.2 kg (168 lb)  03/22/17 76.6 kg (168 lb 12.8 oz)     Intake/Output Summary (Last 24 hours) at 04/18/17 1344 Last data filed at 04/18/17 0900  Gross per 24 hour  Intake              720 ml  Output                0 ml  Net              720 ml     Physical Exam Gen.: Middle aged male not in distress, appears confused  HEENT: No pallor, no icterus, moist mucosa, supple neck Chest: Clear to auscultation bilaterally CVS: Normal S1 and S2, no  murmurs GI: Mildly distended abdomen with ascites, nontender, bowel sounds present Musculoskeletal: Warm, no edema CNS: Alert and oriented 2, mild tremors       Data Review:    CBC  Recent Labs Lab 04/15/17 1450 04/16/17 0621  WBC 8.7 7.4  HGB 13.5 13.1  HCT 41.8 39.6  PLT 255 241  MCV 99.8 100.0  MCH 32.2 33.1  MCHC 32.3 33.1  RDW 17.6* 17.6*  LYMPHSABS 1.0  --   MONOABS 1.0  --   EOSABS 0.1  --   BASOSABS 0.1  --     Chemistries   Recent Labs Lab 04/15/17 1450 04/16/17 0621 04/17/17 0434 04/18/17 0630  NA 140 141 142 140  K 4.4 4.0 3.7 3.9  CL 104 107 107 104  CO2 28 25 26 27   GLUCOSE 138* 125* 67 101*  BUN 28* 24* 22* 26*  CREATININE 1.24 0.90 0.91 1.00  CALCIUM 9.2 8.9 8.5* 8.4*  MG  --  2.0  --   --   AST 111* 160* 259* 219*  ALT 74* 88* 140* 138*  ALKPHOS 500* 524* 558* 633*  BILITOT 1.8* 1.6* 1.4* 1.7*   ------------------------------------------------------------------------------------------------------------------ No results for input(s): CHOL, HDL, LDLCALC, TRIG, CHOLHDL, LDLDIRECT in the last 72 hours.  Lab Results  Component Value Date   HGBA1C 5.8 (H) 04/16/2017   ------------------------------------------------------------------------------------------------------------------ No results for input(s): TSH, T4TOTAL, T3FREE, THYROIDAB in the last 72 hours.  Invalid input(s): FREET3 ------------------------------------------------------------------------------------------------------------------ No results for input(s): VITAMINB12, FOLATE, FERRITIN, TIBC, IRON, RETICCTPCT in the last 72 hours.  Coagulation profile  Recent Labs Lab 04/15/17  1508  INR 1.23    No results for input(s): DDIMER in the last 72 hours.  Cardiac Enzymes No results for input(s): CKMB, TROPONINI, MYOGLOBIN in the last 168 hours.  Invalid input(s):  CK ------------------------------------------------------------------------------------------------------------------ No results found for: BNP  Inpatient Medications  Scheduled Meds: . aspirin  325 mg Oral Daily  . enoxaparin (LOVENOX) injection  40 mg Subcutaneous QHS  . feeding supplement (ENSURE ENLIVE)  237 mL Oral TID BM  . fluticasone  2 spray Each Nare Daily  . furosemide  20 mg Oral Daily  . gentamicin ointment  1 application Topical QHS  . insulin aspart  0-15 Units Subcutaneous TID WC  . insulin aspart  0-5 Units Subcutaneous QHS  . insulin aspart protamine- aspart  8 Units Subcutaneous BID WC  . lactulose  30 g Oral TID  . levothyroxine  50 mcg Oral QAC breakfast  . metoprolol succinate  25 mg Oral Daily  . modafinil  200 mg Oral Daily  . pantoprazole  80 mg Oral Daily  . rifaximin  550 mg Oral BID  . spironolactone  50 mg Oral Daily  . ursodiol  600 mg Oral BID   Continuous Infusions: PRN Meds:.acetaminophen **OR** acetaminophen, acetaminophen, albuterol, ondansetron **OR** ondansetron (ZOFRAN) IV  Micro Results Recent Results (from the past 240 hour(s))  MRSA PCR Screening     Status: None   Collection Time: 04/15/17 11:27 PM  Result Value Ref Range Status   MRSA by PCR NEGATIVE NEGATIVE Final    Comment:        The GeneXpert MRSA Assay (FDA approved for NASAL specimens only), is one component of a comprehensive MRSA colonization surveillance program. It is not intended to diagnose MRSA infection nor to guide or monitor treatment for MRSA infections.     Radiology Reports Dg Chest 2 View  Result Date: 04/15/2017 CLINICAL DATA:  Altered mental status. Fall. Left parietal hematoma. EXAM: CHEST  2 VIEW COMPARISON:  None. FINDINGS: The heart size is exaggerated by low lung volumes. Pacing wires are stable. There is no edema or effusion. No focal airspace disease is present. Degenerative changes are noted at the shoulders. IMPRESSION: 1. Low lung volumes. 2.  No acute cardiopulmonary disease. Electronically Signed   By: San Morelle M.D.   On: 04/15/2017 15:13   Ct Head Wo Contrast  Result Date: 04/15/2017 CLINICAL DATA:  Left parietal hematoma after falling at home. EXAM: CT HEAD WITHOUT CONTRAST TECHNIQUE: Contiguous axial images were obtained from the base of the skull through the vertex without intravenous contrast. COMPARISON:  01/27/2017. FINDINGS: Brain: Diffusely enlarged ventricles and subarachnoid spaces. Patchy white matter low density in both cerebral hemispheres. No intracranial hemorrhage, mass lesion or CT evidence of acute infarction. Vascular: No hyperdense vessel or unexpected calcification. Skull: Normal. Negative for fracture or focal lesion. Sinuses/Orbits: Aplastic frontal sinuses. Stable small amount of bilateral ethmoid air cell opacification. Decreased right mastoid air cell opacification. Other: None. IMPRESSION: 1. No acute abnormality. 2. Stable mild diffuse cerebral and cerebellar atrophy. 3. Stable mild to moderate chronic small vessel white matter ischemic changes in both cerebral hemispheres. 4. Right mastoid air cell opacification with improvement. 5. Stable mild chronic bilateral ethmoid sinusitis. Electronically Signed   By: Claudie Revering M.D.   On: 04/15/2017 15:27   [name] Art/ven Flow Abd Pelv Doppler  Result Date: 04/18/2017 CLINICAL DATA:  56 year old male with recurrent ascites and prior TIPS EXAM: DUPLEX ULTRASOUND OF LIVER TECHNIQUE: Color and duplex Doppler ultrasound was performed to  evaluate TIPS and the hepatic vessels. COMPARISON:  02/17/2017 FINDINGS: Portal Vein Velocities Main:  91 cm/sec TIPS: Velocity measured between 144 centimeter/second and 174 centimeter/second. Hepatic Vein Velocities Right:  59 cm/sec Middle:  67 cm/sec Left:  54 cm/sec Hepatic Artery Velocity:  86 cm/sec Splenic Vein Velocity:  38 cm/sec Varices: None visualize Ascites: Small volume ascites IMPRESSION: Duplex ultrasound demonstrates  patent TIPS. Small volume ascites persists. Electronically Signed   By: Corrie Mckusick D.O.   On: 04/18/2017 13:14   US Abdomen Limited Ruq  Result Date: 04/18/2017 CLINICAL DATA:  Ascites. EXAM: LIMITED ABDOMEN ULTRASOUND FOR ASCITES TECHNIQUE: Limited ultrasound survey for ascites was performed in all four abdominal quadrants. COMPARISON:  02/17/2017. FINDINGS: Mild ascites noted. No large enough fluid collection noted for safe paracentesis. Paracentesis not performed . IMPRESSION: Mild ascites.  Paracentesis not performed. Electronically Signed   By: Marcello Moores  Register   On: 04/18/2017 12:06    Time Spent in minutes  25   Louellen Molder M.D on 04/18/2017 at 1:44 PM  Between 7am to 7pm - Pager - [phone]  After 7pm go to [email or web address] - password [facility]  [facility] -  Office  [phone]

## 2017-04-19 DIAGNOSIS — R740 Nonspecific elevation of levels of transaminase and lactic acid dehydrogenase [LDH]: Secondary | ICD-10-CM

## 2017-04-19 DIAGNOSIS — K7290 Hepatic failure, unspecified without coma: Secondary | ICD-10-CM | POA: Diagnosis present

## 2017-04-19 DIAGNOSIS — K7469 Other cirrhosis of liver: Secondary | ICD-10-CM

## 2017-04-19 DIAGNOSIS — K7682 Hepatic encephalopathy: Secondary | ICD-10-CM | POA: Diagnosis present

## 2017-04-19 DIAGNOSIS — Z95828 Presence of other vascular implants and grafts: Secondary | ICD-10-CM

## 2017-04-19 LAB — COMPREHENSIVE METABOLIC PANEL
ALK PHOS: 548 U/L — AB (ref 38–126)
ALT: 116 U/L — AB (ref 17–63)
ANION GAP: 9 (ref 5–15)
AST: 169 U/L — ABNORMAL HIGH (ref 15–41)
Albumin: 2.5 g/dL — ABNORMAL LOW (ref 3.5–5.0)
BILIRUBIN TOTAL: 1.4 mg/dL — AB (ref 0.3–1.2)
BUN: 25 mg/dL — ABNORMAL HIGH (ref 6–20)
CALCIUM: 8.4 mg/dL — AB (ref 8.9–10.3)
CO2: 25 mmol/L (ref 22–32)
CREATININE: 0.94 mg/dL (ref 0.61–1.24)
Chloride: 105 mmol/L (ref 101–111)
Glucose, Bld: 111 mg/dL — ABNORMAL HIGH (ref 65–99)
Potassium: 4.2 mmol/L (ref 3.5–5.1)
SODIUM: 139 mmol/L (ref 135–145)
TOTAL PROTEIN: 5.4 g/dL — AB (ref 6.5–8.1)

## 2017-04-19 LAB — GLUCOSE, CAPILLARY
GLUCOSE-CAPILLARY: 103 mg/dL — AB (ref 65–99)
GLUCOSE-CAPILLARY: 117 mg/dL — AB (ref 65–99)
GLUCOSE-CAPILLARY: 166 mg/dL — AB (ref 65–99)
GLUCOSE-CAPILLARY: 146 mg/dL — AB (ref 65–99)

## 2017-04-19 MED ORDER — ENSURE ENLIVE PO LIQD: 237.0000 mL | Freq: Three times a day (TID) | ORAL | 12 refills | Status: AC

## 2017-04-19 MED ORDER — RIFAXIMIN 550 MG PO TABS: 550.0000 mg | ORAL_TABLET | Freq: Two times a day (BID) | ORAL | 0 refills | Status: DC

## 2017-04-19 MED ORDER — LACTULOSE 10 GM/15ML PO SOLN
30.0000 g | Freq: Four times a day (QID) | ORAL | Status: DC
  Administered 2017-04-19 – 2017-04-21 (×7): 30 g via ORAL
  Filled 2017-04-19 (×7): qty 60

## 2017-04-19 NOTE — Progress Notes (Signed)
Recent plan to be discharged home as he appeared neurologically improved. On evaluated by gastroenterologist later in the morning he was found to be more confused and quietly staring at her. When I walked into his patient room later his wife was at bedside. He remembered seeing his cardiologist in the hallway but did not remember seeing the gastroenterologist or her nurse practitioner this morning. Discharge held. Neurologic consult called by GI. Lactulose dose increased.

## 2017-04-19 NOTE — Consult Note (Signed)
Nicholas A. Merlene Laughter, MD     [email or web address]          [name] is an 56 y.o. male.   ASSESSMENT/PLAN: 1. There is encephalopathy likely due to toxic metabolic causes and more specifically from hepatic failure: Agree with the current care. No additional workup suggested at this time. 2. Acute gait impairment also due to the above    The patient is a 56 year old white male who presents with the relatively acute onset of confusion, altered mental status and falling. He has a baseline history of hepatic failure and on presentation had elevated ammonia levels. There was some fluctuation in his symptoms and hence a consultation but overall his symptoms have improved since being hospitalized. On attempted discharge this morning he was noted to be confused and not to recognize his physicians were treated him for years. There is a history of noncompliance with medication and care. The patient did have some low back pain and continues to have some pain after his fall. No chest pain or shortness of breath is reported. The patient did not lose consciousness with the fall. No tonic-clonic activities or seizure activity. The review systems otherwise negative. The wife is at the bedside and helps with a history. Wife reports that there is a baseline history of autism.  The patient has severe hearing impairment which limits the review systems but is otherwise unrevealing.   GENERAL:  Pleasant but appears about 94 years older than his stated age  67: Marked hearing impairment  ABDOMEN: Full but soft  EXTREMITIES: 1-2+ pitting edema up to about two thirds today leg and associated with some erythema. Superficial laceration bruise involving the left elbow status post fall.  BACK: Normal  SKIN: Normal by inspection.    MENTAL STATUS: He is awake and alert. He is cooperative with evaluation. He is oriented to person, place year and month. Speech is markedly dysarthric  which appears to be his baseline.  CRANIAL NERVES: Pupils are equal, round and reactive to light; the EOM are full, there is no significant nystagmus; visual fields are full; upper and lower facial muscles are normal in strength and symmetric, there is no flattening of the nasolabial folds; tongue is midline; uvula is midline; shoulder elevation is normal.  MOTOR: Normal tone, bulk and strength; no pronator drift.  COORDINATION: Left finger to nose is normal, right finger to nose is normal, No rest tremor; no intention tremor; no postural tremor; no bradykinesia.  REFLEXES: Deep tendon reflexes are symmetrical and normal. Babinski reflexes are flexor bilaterally.   SENSATION: Normal to pain        The head CT scan is reviewed in person and shows mild global atrophy for age otherwise this is unrevealing.    Blood pressure 106/87, pulse 73, temperature 97.7 F (36.5 C), temperature source Oral, resp. rate 16, height 5' 3"  (1.6 m), weight 149 lb 7.6 oz (67.8 kg), SpO2 100 %.  Past Medical History:  Diagnosis Date  . Alcohol use   . Anemia   . Aortic stenosis    mild  . Arthritis   . Atrial flutter ([place])   . Benign hypertrophy of prostate    with urinary retention; repaired hypospadia; transurethral resection of the prostate   . Cardiac conduction disorder    status post pacemaker implantation in 1995 generator replaced 2005  . Chest pain 2007, 2009   cardiac cath > normal coronary arterie; nl left ventricular function  . CHF (congestive heart failure) (  [place])   . Cirrhosis ([place]) 11/2016  . Congenital deafness   . COPD (chronic obstructive pulmonary disease) ([place])   . Diabetes mellitus    +Insulin  . Dyspnea   . Edema   . GERD (gastroesophageal reflux disease)    status post multiple dilatations for stricture  . 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 Hepatitis    Thinks it was B  . History of kidney stones   .  HTN (hypertension)   . Hyperlipidemia    statin discontinued due to abn LFTs  . Hypothyroidism   . Mitral regurgitation    insignificant  . Obesity   . Obesity 4/08   BMI= 40  . Pelvic mass    identified in 2009, stable 2010  . PONV (postoperative nausea and vomiting)   . Presence of permanent cardiac pacemaker   . Sleep apnea    BiPAP and continuous oxygen  . Syncope   . Thyroid disease     Past Surgical History:  Procedure Laterality Date  . A-V CARDIAC PACEMAKER INSERTION  1995  . COLONOSCOPY  2008  . COLONOSCOPY N/A 06/23/2015   JDB:ZMCEYEM diverticulosis  . ESOPHAGOGASTRODUODENOSCOPY N/A 06/23/2015   VVK:PQAE portal gastropathy  . IR GENERIC HISTORICAL  12/14/2016   IR PARACENTESIS 12/14/2016 Sandi Mariscal, MD [facility]  . IR GENERIC HISTORICAL  12/14/2016   IR TIPS 12/14/2016 Sandi Mariscal, MD [facility]  . IR PARACENTESIS  01/12/2017  . IR RADIOLOGIST EVAL & MGMT  01/06/2017  . IR RADIOLOGIST EVAL & MGMT  02/08/2017  . IR RADIOLOGIST EVAL & MGMT  11/30/2016  . IR TIPS  01/12/2017  . NASAL SINUS SURGERY     partially removed  . ORCHIECTOMY  1990   bilateral; ?neoplasm  . PACEMAKER INSERTION  05/2004; 10/04/2013   MDT dual chamber pacemaker; gen change 10/04/2013 (MDT ADDRL1)  . PERMANENT PACEMAKER GENERATOR CHANGE N/A 10/04/2013   Procedure: PERMANENT PACEMAKER GENERATOR CHANGE;  Surgeon: Evans Lance, MD;  Location: [facility] CATH LAB;  Service: Cardiovascular;  Laterality: N/A;  . RADIOLOGY WITH ANESTHESIA N/A 12/14/2016   Procedure: TIPS;  Surgeon: Sandi Mariscal, MD;  Location: [place];  Service: Radiology;  Laterality: N/A;  . RADIOLOGY WITH ANESTHESIA N/A 01/12/2017   Procedure: TIPS;  Surgeon: Sandi Mariscal, MD;  Location: [place];  Service: Radiology;  Laterality: N/A;  . REPAIR HYPOSPADIAS W/ URETHROPLASTY    . TONSILLECTOMY AND ADENOIDECTOMY    . TRANSURETHRAL RESECTION OF PROSTATE     for BPH    Family History  Problem Relation Age of Onset  . COPD Mother   . Hypertension  Mother   . Congestive Heart Failure Mother   . Pneumonia Father   . Heart disease Other   . Hypertension Other   . Colon cancer Neg Hx     Social History:  reports that he has never smoked. He has never used smokeless tobacco. He reports that he does not drink alcohol or use drugs.  Allergies:  Allergies  Allergen Reactions  . Penicillins Swelling    SWELLING OF AIRWAY  PATIENT HAD A PCN REACTION WITH IMMEDIATE RASH, FACIAL/TONGUE/THROAT SWELLING, SOB, OR LIGHTHEADEDNESS WITH HYPOTENSION:  #  #  #  YES  #  #  #  Has patient had a PCN reaction causing severe rash involving mucus membranes or skin necrosis: No Has patient had a PCN reaction that required hospitalization No Has patient had a  PCN reaction occurring within the last 10 years: No  . Enalapril Cough  . Metformin And Related Diarrhea  . Eliquis [Apixaban] Other (See Comments)    DOSE RELATED BLEEDING    Medications: Prior to Admission medications   Medication Sig Start Date End Date Taking? Authorizing Provider  acetaminophen (TYLENOL) 325 MG tablet Take 650 mg by mouth every 6 (six) hours as needed for mild pain.   Yes [provider]  albuterol (PROVENTIL HFA;VENTOLIN HFA) 108 (90 Base) MCG/ACT inhaler Inhale 2 puffs into the lungs every 6 (six) hours as needed for wheezing or shortness of breath.   Yes [provider]  albuterol (PROVENTIL) (2.5 MG/3ML) 0.083% nebulizer solution Take 2.5 mg by nebulization every 6 (six) hours as needed for wheezing or shortness of breath.   Yes [provider]  aspirin 325 MG EC tablet Take 325 mg by mouth daily.   Yes [provider]  cetirizine (ZYRTEC) 10 MG tablet Take 10 mg by mouth daily.   Yes [provider]  furosemide (LASIX) 20 MG tablet Take 1 tablet (20 mg total) by mouth daily. 03/22/17  Yes Annitta Needs, NP  gentamicin ointment (GARAMYCIN) 0.1 % Apply 1 application topically at bedtime. Inside nose    Yes [provider]   Glucosamine-Chondroit-Vit C-Mn (GLUCOSAMINE CHONDR 1500 COMPLX PO) Take 1 tablet by mouth every morning.   Yes [provider]  HUMALOG MIX 75/25 KWIKPEN (75-25) 100 UNIT/ML Kwikpen Inject 8 Units into the skin 2 (two) times daily. 15 units in the morning and 15 units in the evening Patient taking differently: Inject 0-8 Units into the skin 2 (two) times daily. Takes 8 units every morning. Takes a dose in the evening using a sliding scale. 4units in evening 11/14/16  Yes Eber Jones, MD  lactulose (CHRONULAC) 10 GM/15ML solution 10cc (2 teaspoons) bid; Titrate to 2-3 semi formed stools a day Patient taking differently: Take 6.7 g by mouth 2 (two) times daily. 10cc (2 teaspoons) bid; Titrate to 2-3 semi formed stools a day 11/14/16  Yes Eber Jones, MD  levothyroxine (LEVOXYL) 50 MCG tablet Take 50 mcg by mouth daily before breakfast.    Yes [provider]  metoprolol succinate (TOPROL-XL) 25 MG 24 hr tablet Take 25 mg by mouth daily.   Yes [provider]  modafinil (PROVIGIL) 200 MG tablet Take 200 mg by mouth daily.  01/13/15  Yes [provider]  mometasone (NASONEX) 50 MCG/ACT nasal spray Place 2 sprays into the nose daily as needed (FOR CONGESTION/ALLERGIES).   Yes [provider]  Multiple Vitamins-Minerals (CENTRUM) tablet Take 1 tablet by mouth daily.     Yes [provider]  nitroGLYCERIN (NITROSTAT) 0.4 MG SL tablet Place 1 tablet (0.4 mg total) under the tongue every 5 (five) minutes as needed for chest pain. 06/26/15  Yes Lendon Colonel, NP  NON FORMULARY Bipap 16.0/e   Yes [provider]  Omega-3 Fatty Acids (FISH OIL) 1000 MG CAPS Take 1,000 mg by mouth every other day.    Yes [provider]  omeprazole (PRILOSEC) 40 MG capsule TAKE 1 CAPSULE BY MOUTH ONCE DAILY FOR REFLUX. 04/11/17  Yes Branch, Alphonse Guild, MD  spironolactone (ALDACTONE) 50 MG tablet Take 1 tablet (50 mg total) by mouth daily.  03/22/17  Yes Annitta Needs, NP  ursodiol (ACTIGALL) 500 MG tablet TAKE 1 TABLET BY MOUTH TWICE DAILY 08/11/16  Yes Carlis Stable, NP  feeding supplement, ENSURE  ENLIVE, (ENSURE ENLIVE) LIQD Take 237 mLs by mouth 3 (three) times daily between meals. 04/19/17   Dhungel, Flonnie Overman, MD  rifaximin (XIFAXAN) 550 MG TABS tablet Take 1 tablet (550 mg total) by mouth 2 (two) times daily. 04/19/17   Dhungel, Flonnie Overman, MD    Scheduled Meds: . aspirin  325 mg Oral Daily  . enoxaparin (LOVENOX) injection  40 mg Subcutaneous QHS  . feeding supplement (ENSURE ENLIVE)  237 mL Oral TID BM  . fluticasone  2 spray Each Nare Daily  . furosemide  20 mg Oral Daily  . gentamicin ointment  1 application Topical QHS  . insulin aspart  0-15 Units Subcutaneous TID WC  . insulin aspart  0-5 Units Subcutaneous QHS  . insulin aspart protamine- aspart  8 Units Subcutaneous BID WC  . lactulose  30 g Oral QID  . levothyroxine  50 mcg Oral QAC breakfast  . metoprolol succinate  25 mg Oral Daily  . modafinil  200 mg Oral Daily  . pantoprazole  40 mg Oral Daily  . rifaximin  550 mg Oral BID  . spironolactone  50 mg Oral Daily  . ursodiol  600 mg Oral BID   Continuous Infusions: PRN Meds:.acetaminophen **OR** acetaminophen, acetaminophen, albuterol, ondansetron **OR** ondansetron (ZOFRAN) IV     Results for orders placed or performed during the hospital encounter of 04/15/17 (from the past 48 hour(s))  Glucose, capillary     Status: Abnormal   Collection Time: 04/17/17  8:58 PM  Result Value Ref Range   Glucose-Capillary 126 (H) 65 - 99 mg/dL   Comment 1 Notify RN    Comment 2 Document in Chart   Comprehensive metabolic panel     Status: Abnormal   Collection Time: 04/18/17  6:30 AM  Result Value Ref Range   Sodium 140 135 - 145 mmol/L   Potassium 3.9 3.5 - 5.1 mmol/L   Chloride 104 101 - 111 mmol/L   CO2 27 22 - 32 mmol/L   Glucose, Bld 101 (H) 65 - 99 mg/dL   BUN 26 (H) 6 - 20 mg/dL   Creatinine, Ser 1.00 0.61  - 1.24 mg/dL   Calcium 8.4 (L) 8.9 - 10.3 mg/dL   Total Protein 5.6 (L) 6.5 - 8.1 g/dL   Albumin 2.5 (L) 3.5 - 5.0 g/dL   AST 219 (H) 15 - 41 U/L   ALT 138 (H) 17 - 63 U/L   Alkaline Phosphatase 633 (H) 38 - 126 U/L   Total Bilirubin 1.7 (H) 0.3 - 1.2 mg/dL   GFR calc non Af Amer >60 >60 mL/min   GFR calc Af Amer >60 >60 mL/min    Comment: (NOTE) The eGFR has been calculated using the CKD EPI equation. This calculation has not been validated in all clinical situations. eGFR's persistently <60 mL/min signify possible Chronic Kidney Disease.    Anion gap 9 5 - 15  Glucose, capillary     Status: None   Collection Time: 04/18/17  7:33 AM  Result Value Ref Range   Glucose-Capillary 96 65 - 99 mg/dL  Glucose, capillary     Status: Abnormal   Collection Time: 04/18/17 11:18 AM  Result Value Ref Range   Glucose-Capillary 104 (H) 65 - 99 mg/dL  Protime-INR     Status: Abnormal   Collection Time: 04/18/17  3:01 PM  Result Value Ref Range   Prothrombin Time 15.9 (H) 11.4 - 15.2 seconds   INR 1.26   Glucose, capillary  Status: Abnormal   Collection Time: 04/18/17  4:22 PM  Result Value Ref Range   Glucose-Capillary 160 (H) 65 - 99 mg/dL  Glucose, capillary     Status: Abnormal   Collection Time: 04/18/17  9:14 PM  Result Value Ref Range   Glucose-Capillary 127 (H) 65 - 99 mg/dL   Comment 1 Notify RN    Comment 2 Document in Chart   Comprehensive metabolic panel     Status: Abnormal   Collection Time: 04/19/17  6:27 AM  Result Value Ref Range   Sodium 139 135 - 145 mmol/L   Potassium 4.2 3.5 - 5.1 mmol/L   Chloride 105 101 - 111 mmol/L   CO2 25 22 - 32 mmol/L   Glucose, Bld 111 (H) 65 - 99 mg/dL   BUN 25 (H) 6 - 20 mg/dL   Creatinine, Ser 0.94 0.61 - 1.24 mg/dL   Calcium 8.4 (L) 8.9 - 10.3 mg/dL   Total Protein 5.4 (L) 6.5 - 8.1 g/dL   Albumin 2.5 (L) 3.5 - 5.0 g/dL   AST 169 (H) 15 - 41 U/L   ALT 116 (H) 17 - 63 U/L   Alkaline Phosphatase 548 (H) 38 - 126 U/L   Total  Bilirubin 1.4 (H) 0.3 - 1.2 mg/dL   GFR calc non Af Amer >60 >60 mL/min   GFR calc Af Amer >60 >60 mL/min    Comment: (NOTE) The eGFR has been calculated using the CKD EPI equation. This calculation has not been validated in all clinical situations. eGFR's persistently <60 mL/min signify possible Chronic Kidney Disease.    Anion gap 9 5 - 15  Glucose, capillary     Status: Abnormal   Collection Time: 04/19/17  7:31 AM  Result Value Ref Range   Glucose-Capillary 103 (H) 65 - 99 mg/dL  Glucose, capillary     Status: Abnormal   Collection Time: 04/19/17 11:18 AM  Result Value Ref Range   Glucose-Capillary 117 (H) 65 - 99 mg/dL  Glucose, capillary     Status: Abnormal   Collection Time: 04/19/17  4:53 PM  Result Value Ref Range   Glucose-Capillary 166 (H) 65 - 99 mg/dL    Studies/Results:   HEAD CT IMPRESSION: 1. No acute abnormality. 2. Stable mild diffuse cerebral and cerebellar atrophy. 3. Stable mild to moderate chronic small vessel white matter ischemic changes in both cerebral hemispheres. 4. Right mastoid air cell opacification with improvement. 5. Stable mild chronic bilateral ethmoid sinusitis.        Clif Serio A. Merlene [name], M.D.  Diplomate, Tax adviser of Psychiatry and Neurology ( Neurology). 04/19/2017, 5:27 PM

## 2017-04-19 NOTE — Progress Notes (Signed)
    Subjective: States he is going home today. No abdominal pain. Mental status at baseline. Extremely hard of hearing but oriented to year, situation.   Objective: Vital signs in last 24 hours: Temp:  [97.6 F (36.4 C)-97.9 F (36.6 C)] 97.6 F (36.4 C) (07/31 0451) Pulse Rate:  [64-90] 82 (07/31 0451) Resp:  [14-16] 14 (07/31 0451) BP: (94-106)/(67-73) 94/67 (07/31 0451) SpO2:  [96 %-100 %] 97 % (07/31 0451) Last BM Date: 04/18/17 General:   Alert and oriented to person, situation, place. Extremely hard of hearing Abdomen:  Bowel sounds present, distended but soft. Umbilical hernia noted. No TTP, rebound, or guarding Extremities:  With trace edema. Psych:  Alert and cooperative. Normal mood and affect.  Intake/Output from previous day: 07/30 0701 - 07/31 0700 In: 240 [P.O.:240] Out: 575 [Urine:575] Intake/Output this shift: No intake/output data recorded.  BMET  Recent Labs  04/17/17 0434 04/18/17 0630 04/19/17 0627  NA 142 140 139  K 3.7 3.9 4.2  CL 107 104 105  CO2 26 27 25   GLUCOSE 67 101* 111*  BUN 22* 26* 25*  CREATININE 0.91 1.00 0.94  CALCIUM 8.5* 8.4* 8.4*   LFT  Recent Labs  04/17/17 0434 04/18/17 0630 04/19/17 0627  PROT 5.3* 5.6* 5.4*  ALBUMIN 2.2* 2.5* 2.5*  AST 259* 219* 169*  ALT 140* 138* 116*  ALKPHOS 558* 633* 548*  BILITOT 1.4* 1.7* 1.4*   PT/INR  Recent Labs  04/18/17 1501  LABPROT 15.9*  INR 1.26     Studies/Results: [name] Art/ven Flow Abd Pelv Doppler  Result Date: 04/18/2017 CLINICAL DATA:  56 year old male with recurrent ascites and prior TIPS EXAM: DUPLEX ULTRASOUND OF LIVER TECHNIQUE: Color and duplex Doppler ultrasound was performed to evaluate TIPS and the hepatic vessels. COMPARISON:  02/17/2017 FINDINGS: Portal Vein Velocities Main:  91 cm/sec TIPS: Velocity measured between 144 centimeter/second and 174 centimeter/second. Hepatic Vein Velocities Right:  59 cm/sec Middle:  67 cm/sec Left:  54 cm/sec Hepatic Artery  Velocity:  86 cm/sec Splenic Vein Velocity:  38 cm/sec Varices: None visualize Ascites: Small volume ascites IMPRESSION: Duplex ultrasound demonstrates patent TIPS. Small volume ascites persists. Electronically Signed   By: Corrie Mckusick D.O.   On: 04/18/2017 13:14   US Abdomen Limited Ruq  Result Date: 04/18/2017 CLINICAL DATA:  Ascites. EXAM: LIMITED ABDOMEN ULTRASOUND FOR ASCITES TECHNIQUE: Limited ultrasound survey for ascites was performed in all four abdominal quadrants. COMPARISON:  02/17/2017. FINDINGS: Mild ascites noted. No large enough fluid collection noted for safe paracentesis. Paracentesis not performed . IMPRESSION: Mild ascites.  Paracentesis not performed. Electronically Signed   By: Marcello Moores  Register   On: 04/18/2017 12:06    Assessment: 56 year old male with extensive medical history to include NASH cirrhosis, empiric treatment for AMA negative PBC on Urso,  s/p TIPS in April 2018, presenting with encephalopathy in setting of non-compliant outpatient therapy with prescribed medications.  TIPS remains patent. Appears at baseline today. Appropriate for discharge home with home health.    Plan: Lactulose to achieve 3 soft bowel movements daily. Continue Xifaxan BID Continue Urso PPI daily Lasix 20 mg and aldactone 50 mg daily Appropriate for discharge home with home health. Will sign off; please call with questions. Will arrange outpatient follow-up with [facility]   Annitta Needs, PhD, ANP-BC [facility]    LOS: 4 days    04/19/2017, 9:36 AM

## 2017-04-19 NOTE — Discharge Summary (Signed)
Physician Discharge Summary  [name] ZOX:[id number] DOB: [birth year] DOA: 04/15/2017  PCP: Petra Kuba, MD  Admit date: 04/15/2017 Discharge date: 04/19/2017  Admitted From: Home Disposition: Home  Recommendations for Outpatient Follow-up:  1. Follow up with PCP in 1-2 weeks 2. Patient will be followed by his gastroenterologist as outpatient  Home Health: RN/PT/OT/home health aide Equipment/Devices:None  Discharge Condition:Fair CODE STATUS:Full code Diet recommendation: Heart healthy/carb modified    Discharge Diagnoses:  Principal Problem:   Acute hepatic encephalopathy   Active Problems:     Ascites   Elevated LFTs   Liver cirrhosis secondary to NASH ([place])   Decompensated liver disease ([place])   Type 2 diabetes mellitus with hyperglycemia ([place])   GASTROESOPHAGEAL REFLUX DISEASE   Hypertension   Obstructive Sleep apnea   Atrial fibrillation ([place])  Brief narrative/history of present illness Please refer to admission H&P for details, in brief,56 year old history with NASH and alcoholic cirrhosis treated empirically for 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 Patient admitted for hepatic encephalopathy.  Principal Problem:   Acute metabolic/ hepatic encephalopathy Hepatic encephalopathy likely in the setting of medication nonadherence. Patient reportedly was not taking his lactulose as prescribed. Placed back on oral lactulose 3 times a day and added rifaximin. GI consult appreciated. His mental status has now returned to baseline.  Will be discharged to resume his oral lactulose 3 times a day (titrated to at least 3 loose bowel movements  daily) and prescribed rifaximin twice daily.  Active Problems: Chronic decompensated liver cirrhosis Underwent TIPS in April 2018. Had increased abdominal distention but without tenderness or fever. Sent for therapeutic paracenteses but ultrasound abdomen shows only mild ascites so it was not done. Continue Lasix and Aldactone.   Chronic transaminitis  Worsened LFTs from prior. Abdominal Doppler shows patent TIPS.  some improvement in his LFTs this morning. Follow-up as outpatient.    Type 2 diabetes mellitus (HCC) A1c of 5.8. Stable on NovoLog 8 units twice a day and sliding scale coverage.  Atrial fibrillation/ flutter Followed by Dr. Harl Bowie. Off anticoagulation due to history of nosebleeds. Continue aspirin and metoprolol.   Essential hypertension Stable. Continue home medications  Moderate to severe aortic stenosis Asymptomatic.  GERD  continue PPI  OSA Continue nighttime BiPAP       Family Communication  : None at bedside   Disposition Plan  : Seen by PT and recommends home with 24 hour supervision. Will arrange home with home health PT/OT/RN and home health aide    Consults  :  GI (Dr. fields)  Procedures  :  Ultrasound abdomen  Abdominal Doppler CT head    Discharge Instructions   Allergies as of 04/19/2017      Reactions   Penicillins Swelling   SWELLING OF AIRWAY PATIENT HAD A PCN REACTION WITH IMMEDIATE RASH, FACIAL/TONGUE/THROAT SWELLING, SOB, OR LIGHTHEADEDNESS WITH HYPOTENSION:  #  #  #  YES  #  #  #  Has patient had a PCN reaction causing severe rash involving mucus membranes or skin necrosis: No Has patient had a PCN reaction that required hospitalization No Has patient had a PCN reaction occurring within the last 10 years: No   Enalapril Cough   Metformin And Related Diarrhea  Eliquis [apixaban] Other (See Comments)   DOSE RELATED BLEEDING      Medication List    TAKE these medications   acetaminophen 325 MG  tablet Commonly known as:  TYLENOL Take 650 mg by mouth every 6 (six) hours as needed for mild pain.   albuterol 108 (90 Base) MCG/ACT inhaler Commonly known as:  PROVENTIL HFA;VENTOLIN HFA Inhale 2 puffs into the lungs every 6 (six) hours as needed for wheezing or shortness of breath. What changed:  Another medication with the same name was removed. Continue taking this medication, and follow the directions you see here.   aspirin 325 MG EC tablet Take 325 mg by mouth daily.   CENTRUM tablet Take 1 tablet by mouth daily.   cetirizine 10 MG tablet Commonly known as:  ZYRTEC Take 10 mg by mouth daily.   feeding supplement (ENSURE ENLIVE) Liqd Take 237 mLs by mouth 3 (three) times daily between meals.   Fish Oil 1000 MG Caps Take 1,000 mg by mouth every other day.   furosemide 20 MG tablet Commonly known as:  LASIX Take 1 tablet (20 mg total) by mouth daily.   gentamicin ointment 0.1 % Commonly known as:  GARAMYCIN Apply 1 application topically at bedtime. Inside nose   GLUCOSAMINE CHONDR 1500 COMPLX PO Take 1 tablet by mouth every morning.   HUMALOG MIX 75/25 KWIKPEN (75-25) 100 UNIT/ML Kwikpen Generic drug:  Insulin Lispro Prot & Lispro Inject 8 Units into the skin 2 (two) times daily. 15 units in the morning and 15 units in the evening What changed:  how much to take  additional instructions   lactulose 10 GM/15ML solution Commonly known as:  CHRONULAC 10cc (2 teaspoons) bid; Titrate to 2-3 semi formed stools a day What changed:  how much to take  how to take this  when to take this  additional instructions   LEVOXYL 50 MCG tablet Generic drug:  levothyroxine Take 50 mcg by mouth daily before breakfast.   metoprolol succinate 25 MG 24 hr tablet Commonly known as:  TOPROL-XL Take 25 mg by mouth daily.   modafinil 200 MG tablet Commonly known as:  PROVIGIL Take 200 mg by mouth daily.   mometasone 50 MCG/ACT nasal spray Commonly known as:   NASONEX Place 2 sprays into the nose daily as needed (FOR CONGESTION/ALLERGIES).   nitroGLYCERIN 0.4 MG SL tablet Commonly known as:  NITROSTAT Place 1 tablet (0.4 mg total) under the tongue every 5 (five) minutes as needed for chest pain.   NON FORMULARY Bipap 16.0/e   omeprazole 40 MG capsule Commonly known as:  PRILOSEC TAKE 1 CAPSULE BY MOUTH ONCE DAILY FOR REFLUX.   rifaximin 550 MG Tabs tablet Commonly known as:  XIFAXAN Take 1 tablet (550 mg total) by mouth 2 (two) times daily.   spironolactone 50 MG tablet Commonly known as:  ALDACTONE Take 1 tablet (50 mg total) by mouth daily.   ursodiol 500 MG tablet Commonly known as:  ACTIGALL TAKE 1 TABLET BY MOUTH TWICE DAILY      Follow-up Information    [facility]   Contact information: [place] [address] [phone]        Annitta Needs, NP Follow up in 4 week(s).   Specialty:  Gastroenterology Why:  office will call  Contact information: [address] [place] [address] [phone]        Petra Kuba, MD. Schedule an appointment as soon as  possible for a visit in 1 week(s).   Specialty:  Family Medicine Contact information: [place] [address] [phone]          Allergies  Allergen Reactions  . Penicillins Swelling    SWELLING OF AIRWAY  PATIENT HAD A PCN REACTION WITH IMMEDIATE RASH, FACIAL/TONGUE/THROAT SWELLING, SOB, OR LIGHTHEADEDNESS WITH HYPOTENSION:  #  #  #  YES  #  #  #  Has patient had a PCN reaction causing severe rash involving mucus membranes or skin necrosis: No Has patient had a PCN reaction that required hospitalization No Has patient had a PCN reaction occurring within the last 10 years: No  . Enalapril Cough  . Metformin And Related Diarrhea  . Eliquis [Apixaban] Other (See Comments)    DOSE RELATED BLEEDING      Procedures/Studies: Dg Chest 2 View  Result Date:  04/15/2017 CLINICAL DATA:  Altered mental status. Fall. Left parietal hematoma. EXAM: CHEST  2 VIEW COMPARISON:  None. FINDINGS: The heart size is exaggerated by low lung volumes. Pacing wires are stable. There is no edema or effusion. No focal airspace disease is present. Degenerative changes are noted at the shoulders. IMPRESSION: 1. Low lung volumes. 2. No acute cardiopulmonary disease. Electronically Signed   By: San Morelle M.D.   On: 04/15/2017 15:13   Ct Head Wo Contrast  Result Date: 04/15/2017 CLINICAL DATA:  Left parietal hematoma after falling at home. EXAM: CT HEAD WITHOUT CONTRAST TECHNIQUE: Contiguous axial images were obtained from the base of the skull through the vertex without intravenous contrast. COMPARISON:  01/27/2017. FINDINGS: Brain: Diffusely enlarged ventricles and subarachnoid spaces. Patchy white matter low density in both cerebral hemispheres. No intracranial hemorrhage, mass lesion or CT evidence of acute infarction. Vascular: No hyperdense vessel or unexpected calcification. Skull: Normal. Negative for fracture or focal lesion. Sinuses/Orbits: Aplastic frontal sinuses. Stable small amount of bilateral ethmoid air cell opacification. Decreased right mastoid air cell opacification. Other: None. IMPRESSION: 1. No acute abnormality. 2. Stable mild diffuse cerebral and cerebellar atrophy. 3. Stable mild to moderate chronic small vessel white matter ischemic changes in both cerebral hemispheres. 4. Right mastoid air cell opacification with improvement. 5. Stable mild chronic bilateral ethmoid sinusitis. Electronically Signed   By: Claudie Revering M.D.   On: 04/15/2017 15:27   [name] Art/ven Flow Abd Pelv Doppler  Result Date: 04/18/2017 CLINICAL DATA:  56 year old male with recurrent ascites and prior TIPS EXAM: DUPLEX ULTRASOUND OF LIVER TECHNIQUE: Color and duplex Doppler ultrasound was performed to evaluate TIPS and the hepatic vessels. COMPARISON:  02/17/2017 FINDINGS: Portal  Vein Velocities Main:  91 cm/sec TIPS: Velocity measured between 144 centimeter/second and 174 centimeter/second. Hepatic Vein Velocities Right:  59 cm/sec Middle:  67 cm/sec Left:  54 cm/sec Hepatic Artery Velocity:  86 cm/sec Splenic Vein Velocity:  38 cm/sec Varices: None visualize Ascites: Small volume ascites IMPRESSION: Duplex ultrasound demonstrates patent TIPS. Small volume ascites persists. Electronically Signed   By: Corrie Mckusick D.O.   On: 04/18/2017 13:14   US Abdomen Limited Ruq  Result Date: 04/18/2017 CLINICAL DATA:  Ascites. EXAM: LIMITED ABDOMEN ULTRASOUND FOR ASCITES TECHNIQUE: Limited ultrasound survey for ascites was performed in all four abdominal quadrants. COMPARISON:  02/17/2017. FINDINGS: Mild ascites noted. No large enough fluid collection noted for safe paracentesis. Paracentesis not performed . IMPRESSION: Mild ascites.  Paracentesis not performed. Electronically Signed   By: Marcello Moores  Register   On: 04/18/2017 12:06       Subjective: Patient  reports feeling better. Denies abdominal pain or discomfort.  Discharge Exam: Vitals:   04/18/17 2113 04/19/17 0451  BP: 106/73 94/67  Pulse: 90 82  Resp: 15 14  Temp: 97.9 F (36.6 C) 97.6 F (36.4 C)   Vitals:   04/18/17 1429 04/18/17 2113 04/18/17 2233 04/19/17 0451  BP: 99/71 106/73  94/67  Pulse: 88 90  82  Resp: 16 15  14   Temp:  97.9 F (36.6 C)  97.6 F (36.4 C)  TempSrc:  Oral  Oral  SpO2: 98% 100% 97% 97%  Weight:      Height:        Gen.: middle Aged male not in distress, appears better oriented today. HEENT: No pallor, no icterus, moist mucosa, supple neck Chest: Clear to auscultation bilaterally CVS: Normal S1 and S2, no murmurs GI: Mildly distended abdomen , nontender, bowel sounds present Musculoskeletal: Warm, no edema CNS: Alert and oriented 2-3 (improved), mild tremors    The results of significant diagnostics from this hospitalization (including imaging, microbiology, ancillary and  laboratory) are listed below for reference.     Microbiology: Recent Results (from the past 240 hour(s))  MRSA PCR Screening     Status: None   Collection Time: 04/15/17 11:27 PM  Result Value Ref Range Status   MRSA by PCR NEGATIVE NEGATIVE Final    Comment:        The GeneXpert MRSA Assay (FDA approved for NASAL specimens only), is one component of a comprehensive MRSA colonization surveillance program. It is not intended to diagnose MRSA infection nor to guide or monitor treatment for MRSA infections.      Labs: BNP (last 3 results) No results for input(s): BNP in the last 8760 hours. Basic Metabolic Panel:  Recent Labs Lab 04/15/17 1450 04/16/17 0621 04/17/17 0434 04/18/17 0630 04/19/17 0627  NA 140 141 142 140 139  K 4.4 4.0 3.7 3.9 4.2  CL 104 107 107 104 105  CO2 28 25 26 27 25   GLUCOSE 138* 125* 67 101* 111*  BUN 28* 24* 22* 26* 25*  CREATININE 1.24 0.90 0.91 1.00 0.94  CALCIUM 9.2 8.9 8.5* 8.4* 8.4*  MG  --  2.0  --   --   --    Liver Function Tests:  Recent Labs Lab 04/15/17 1450 04/16/17 0621 04/17/17 0434 04/18/17 0630 04/19/17 0627  AST 111* 160* 259* 219* 169*  ALT 74* 88* 140* 138* 116*  ALKPHOS 500* 524* 558* 633* 548*  BILITOT 1.8* 1.6* 1.4* 1.7* 1.4*  PROT 5.9* 5.7* 5.3* 5.6* 5.4*  ALBUMIN 2.6* 2.5* 2.2* 2.5* 2.5*   No results for input(s): LIPASE, AMYLASE in the last 168 hours.  Recent Labs Lab 04/15/17 1450 04/16/17 0621 04/17/17 0435  AMMONIA 84* 79* 64*   CBC:  Recent Labs Lab 04/15/17 1450 04/16/17 0621  WBC 8.7 7.4  NEUTROABS 6.6  --   HGB 13.5 13.1  HCT 41.8 39.6  MCV 99.8 100.0  PLT 255 241   Cardiac Enzymes: No results for input(s): CKTOTAL, CKMB, CKMBINDEX, TROPONINI in the last 168 hours. BNP: Invalid input(s): POCBNP CBG:  Recent Labs Lab 04/18/17 0733 04/18/17 1118 04/18/17 1622 04/18/17 2114 04/19/17 0731  GLUCAP 96 104* 160* 127* 103*   D-Dimer No results for input(s): DDIMER in the last  72 hours. Hgb A1c No results for input(s): HGBA1C in the last 72 hours. Lipid Profile No results for input(s): CHOL, HDL, LDLCALC, TRIG, CHOLHDL, LDLDIRECT in the last 72 hours. Thyroid function studies  No results for input(s): TSH, T4TOTAL, T3FREE, THYROIDAB in the last 72 hours.  Invalid input(s): FREET3 Anemia work up No results for input(s): VITAMINB12, FOLATE, FERRITIN, TIBC, IRON, RETICCTPCT in the last 72 hours. Urinalysis    Component Value Date/Time   COLORURINE YELLOW 04/16/2017 0811   APPEARANCEUR CLOUDY (A) 04/16/2017 0811   LABSPEC 1.014 04/16/2017 0811   PHURINE 5.0 04/16/2017 0811   GLUCOSEU NEGATIVE 04/16/2017 0811   HGBUR SMALL (A) 04/16/2017 0811   BILIRUBINUR NEGATIVE 04/16/2017 0811   KETONESUR NEGATIVE 04/16/2017 0811   PROTEINUR NEGATIVE 04/16/2017 0811   UROBILINOGEN 0.2 05/13/2015 1315   NITRITE NEGATIVE 04/16/2017 0811   LEUKOCYTESUR LARGE (A) 04/16/2017 0811   Sepsis Labs Invalid input(s): PROCALCITONIN,  WBC,  LACTICIDVEN Microbiology Recent Results (from the past 240 hour(s))  MRSA PCR Screening     Status: None   Collection Time: 04/15/17 11:27 PM  Result Value Ref Range Status   MRSA by PCR NEGATIVE NEGATIVE Final    Comment:        The GeneXpert MRSA Assay (FDA approved for NASAL specimens only), is one component of a comprehensive MRSA colonization surveillance program. It is not intended to diagnose MRSA infection nor to guide or monitor treatment for MRSA infections.      Time coordinating discharge: Over 30 minutes  SIGNED:   Louellen Molder, MD  [facility] 04/19/2017, 10:17 AM Pager   If 7PM-7AM, please contact night-coverage [email or web address] Password [email or web address]

## 2017-04-20 DIAGNOSIS — I10 Essential (primary) hypertension: Secondary | ICD-10-CM

## 2017-04-20 DIAGNOSIS — K7200 Acute and subacute hepatic failure without coma: Principal | ICD-10-CM

## 2017-04-20 DIAGNOSIS — K7581 Nonalcoholic steatohepatitis (NASH): Secondary | ICD-10-CM

## 2017-04-20 DIAGNOSIS — I480 Paroxysmal atrial fibrillation: Secondary | ICD-10-CM

## 2017-04-20 DIAGNOSIS — K7460 Unspecified cirrhosis of liver: Secondary | ICD-10-CM

## 2017-04-20 DIAGNOSIS — K219 Gastro-esophageal reflux disease without esophagitis: Secondary | ICD-10-CM

## 2017-04-20 LAB — GLUCOSE, CAPILLARY
GLUCOSE-CAPILLARY: 135 mg/dL — AB (ref 65–99)
GLUCOSE-CAPILLARY: 113 mg/dL — AB (ref 65–99)
Glucose-Capillary: 148 mg/dL — ABNORMAL HIGH (ref 65–99)
Glucose-Capillary: 118 mg/dL — ABNORMAL HIGH (ref 65–99)

## 2017-04-20 LAB — AMMONIA: AMMONIA: 53 umol/L — AB (ref 9–35)

## 2017-04-20 NOTE — Progress Notes (Signed)
Subjective: More conversant today, keeping eye contact and quick to respond. Alert to person, situation, place. States year is 2008 but quickly reoriented. 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 Tells me he had 2 bowel movements this morning that were "loose".   Objective: Vital signs in last 24 hours: Temp:  [97.5 F (36.4 C)-97.8 F (36.6 C)] 97.5 F (36.4 C) (08/01 0624) Pulse Rate:  [73-88] 85 (08/01 0624) Resp:  [16] 16 (08/01 0624) BP: (105-112)/(71-87) 105/71 (08/01 0624) SpO2:  [95 %-100 %] 95 % (08/01 0624) Last BM Date: 04/19/17 General:   Alert and oriented, pleasant, improved from yesterday. Hard of hearing.  Eyes:  No icterus, sclera clear.  Abdomen:  Sitting on edge of bed. +BS. Umbilical hernia noted.  Extremities:  Without edema. Neurologic:  Alert and  oriented to person, place, situation. Improved clinically from yesterday's exa Psych:  Alert and cooperative. Normal mood and affect.  BMET  Recent Labs  04/18/17 0630 04/19/17 0627  NA 140 139  K 3.9 4.2  CL 104 105  CO2 27 25  GLUCOSE 101* 111*  BUN 26* 25*  CREATININE 1.00 0.94  CALCIUM 8.4* 8.4*   LFT  Recent Labs  04/18/17 0630 04/19/17 0627  PROT 5.6* 5.4*  ALBUMIN 2.5* 2.5*  AST 219* 169*  ALT 138* 116*  ALKPHOS 633* 548*  BILITOT 1.7* 1.4*   PT/INR  Recent Labs  04/18/17 1501  LABPROT 15.9*  INR 1.26    Studies/Results: [name] Art/ven Flow Abd Pelv Doppler  Result Date: 04/18/2017 CLINICAL DATA:  56 year old male with recurrent ascites and prior TIPS EXAM: DUPLEX ULTRASOUND OF LIVER TECHNIQUE: Color and duplex Doppler ultrasound was performed to evaluate TIPS and the hepatic vessels. COMPARISON:  02/17/2017 FINDINGS: Portal Vein Velocities Main:  91 cm/sec TIPS: Velocity measured between 144 centimeter/second and 174 centimeter/second. Hepatic Vein Velocities Right:  59 cm/sec Middle:  67 cm/sec Left:  54 cm/sec Hepatic Artery Velocity:  86  cm/sec Splenic Vein Velocity:  38 cm/sec Varices: None visualize Ascites: Small volume ascites IMPRESSION: Duplex ultrasound demonstrates patent TIPS. Small volume ascites persists. Electronically Signed   By: Corrie Mckusick D.O.   On: 04/18/2017 13:14   US Abdomen Limited Ruq  Result Date: 04/18/2017 CLINICAL DATA:  Ascites. EXAM: LIMITED ABDOMEN ULTRASOUND FOR ASCITES TECHNIQUE: Limited ultrasound survey for ascites was performed in all four abdominal quadrants. COMPARISON:  02/17/2017. FINDINGS: Mild ascites noted. No large enough fluid collection noted for safe paracentesis. Paracentesis not performed . IMPRESSION: Mild ascites.  Paracentesis not performed. Electronically Signed   By: Marcello Moores  Register   On: 04/18/2017 12:06    Assessment: 56 year old male with extensive medical history to include NASH cirrhosis, empiric treatment for AMA negative PBC on Urso, s/p TIPS in April 2018, presenting with encephalopathy in setting of non-compliant outpatient therapy with prescribed medications.  TIPS remains patent. Fluctuating mental status noted yesterday, with neurology consult appreciated. No additional inpatient neurology work-up suggested. He is more conversant today and with appropriate affect when I saw him, now consistent with knowing orientation and situation. 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 Overall, at the time that I saw patient this morning, he was improved from yesterday. 2 bowel movements already this morning.    Plan: Continue increased dosing of lactulose: monitor for diarrhea Continue Xifaxan Continue Urso Hopeful discharge in next 24 hours Appreciate neurology consult  Vicente Males  W. Cyndi Bender, PhD, ANP-BC [facility]    LOS: 5 days    04/20/2017, 8:01 AM

## 2017-04-20 NOTE — Progress Notes (Signed)
PROGRESS NOTE    [name]  CHE:[id number] DOB: [birth year] DOA: 04/15/2017 PCP: Petra Kuba, MD    Brief Narrative:  56 year old male who presented with confusion and falls. 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 Patient was noted to be increasingly confused over last 3-4 days prior to hospitalization, associated with difficulty walking, refusing his medications. Noted to have frequent falls at home. On initial physical examination blood pressure 140/96, heart rate 91, temperature 97.3, respiratory 23, moist mucous membranes, lungs clear to auscultation bilaterally, heart S1 and S2 present and rhythmic, abdomen was soft nontender, no masses or organomegaly. No lower extremity edema. Positive asterixis. Patient admitted to the hospital working diagnosis of metabolic encephalopathy related to acute on chronic liver failure, portosystemic hepatoencephalopathy.    Assessment & Plan:   Principal Problem:   Acute hepatic encephalopathy Active Problems:   Type 2 diabetes mellitus (HCC)   GASTROESOPHAGEAL REFLUX DISEASE   Hypertension   Sleep apnea   Atrial fibrillation (HCC)   Ascites   Elevated LFTs   Liver cirrhosis secondary to NASH (HCC)   Decompensated liver disease ([place])   1. Acute metabolic encephalopathy/ portosystemic hepato-encephalopathy.  Discharge was cancelled yesterday due to worsening encephalopathy, this am more awake and reactive, patient's wife at the bedside. Patient tolerating well lactulose. On exam awake and alert and following commands, no asterixis. Will follow on ammonia level, to check trend. Out of bed as tolerated, to ambulate in the hallways. Continue with neuro checks.    2. Chronic liver failure. No stigmata of advance liver failure on physical examination, will continue diuretic therapy with  furosemide, spironolactone and rifaximin.   3. T2dM. Patient tolerating po well, will continue glucose cover and monitoring with iss. On 70/30 insulin with capillary glucose 166, 148, 118.   4. Atrial fibrillation. Rate controlled with metoprolol, and anticoagulation with asa.   5. HTN. Blood pressure stable, will continue metoprolol.   6. GERD. Will continue pantoprazole.    DVT prophylaxis: scd Code Status: full  Family Communication: Disposition Plan: Home     Consultants:   Gastroenterology   Procedures:    Antimicrobials:   Subjective: Patient this am awake and alert, orientated, no nausea or vomiting, has been ambulating to the restroom. No nausea or vomiting, and tolerating po well.   Objective: Vitals:   04/19/17 1351 04/19/17 2156 04/19/17 2300 04/20/17 0624  BP: 106/87 112/81  105/71  Pulse: 73 88  85  Resp: 16 16  16   Temp: 97.7 F (36.5 C) 97.8 F (36.6 C)  (!) 97.5 F (36.4 C)  TempSrc: Oral Oral  Axillary  SpO2: 100% 99% 97% 95%  Weight:      Height:       No intake or output data in the 24 hours ending 04/20/17 0854 Filed Weights   04/15/17 1348 04/15/17 2342  Weight: 76.2 kg (168 lb) 67.8 kg (149 lb 7.6 oz)    Examination:  General exam: deconditioned E ENT: Mild pallor, with no icterus, oral mucosa moist.  Respiratory system: Clear to auscultation. Respiratory effort normal. No wheezing, rales or rhonchi.  Cardiovascular system: S1 & S2 heard, RRR. No JVD, murmurs, rubs, gallops or clicks. No pedal edema. Gastrointestinal system: Abdomen is nondistended, soft and nontender. No organomegaly or masses felt. Normal bowel sounds heard. Central nervous system: Alert and oriented. No focal neurological deficits. Extremities:  Symmetric 5 x 5 power. Skin: No rashes, lesions or ulcers    Data Reviewed: I have personally reviewed following labs and imaging studies  CBC:  Recent Labs Lab 04/15/17 1450 04/16/17 0621  WBC 8.7 7.4  NEUTROABS  6.6  --   HGB 13.5 13.1  HCT 41.8 39.6  MCV 99.8 100.0  PLT 255 025   Basic Metabolic Panel:  Recent Labs Lab 04/15/17 1450 04/16/17 0621 04/17/17 0434 04/18/17 0630 04/19/17 0627  NA 140 141 142 140 139  K 4.4 4.0 3.7 3.9 4.2  CL 104 107 107 104 105  CO2 28 25 26 27 25   GLUCOSE 138* 125* 67 101* 111*  BUN 28* 24* 22* 26* 25*  CREATININE 1.24 0.90 0.91 1.00 0.94  CALCIUM 9.2 8.9 8.5* 8.4* 8.4*  MG  --  2.0  --   --   --    GFR: Estimated Creatinine Clearance: 70.6 mL/min (by C-G formula based on SCr of 0.94 mg/dL). Liver Function Tests:  Recent Labs Lab 04/15/17 1450 04/16/17 0621 04/17/17 0434 04/18/17 0630 04/19/17 0627  AST 111* 160* 259* 219* 169*  ALT 74* 88* 140* 138* 116*  ALKPHOS 500* 524* 558* 633* 548*  BILITOT 1.8* 1.6* 1.4* 1.7* 1.4*  PROT 5.9* 5.7* 5.3* 5.6* 5.4*  ALBUMIN 2.6* 2.5* 2.2* 2.5* 2.5*   No results for input(s): LIPASE, AMYLASE in the last 168 hours.  Recent Labs Lab 04/15/17 1450 04/16/17 0621 04/17/17 0435  AMMONIA 84* 79* 64*   Coagulation Profile:  Recent Labs Lab 04/15/17 1508 04/18/17 1501  INR 1.23 1.26   Cardiac Enzymes: No results for input(s): CKTOTAL, CKMB, CKMBINDEX, TROPONINI in the last 168 hours. BNP (last 3 results) No results for input(s): PROBNP in the last 8760 hours. HbA1C: No results for input(s): HGBA1C in the last 72 hours. CBG:  Recent Labs Lab 04/19/17 0731 04/19/17 1118 04/19/17 1653 04/19/17 2159 04/20/17 0725  GLUCAP 103* 117* 166* 146* 148*   Lipid Profile: No results for input(s): CHOL, HDL, LDLCALC, TRIG, CHOLHDL, LDLDIRECT in the last 72 hours. Thyroid Function Tests: No results for input(s): TSH, T4TOTAL, FREET4, T3FREE, THYROIDAB in the last 72 hours. Anemia Panel: No results for input(s): VITAMINB12, FOLATE, FERRITIN, TIBC, IRON, RETICCTPCT in the last 72 hours. Sepsis Labs: No results for input(s): PROCALCITON, LATICACIDVEN in the last 168 hours.  Recent Results (from the  past 240 hour(s))  MRSA PCR Screening     Status: None   Collection Time: 04/15/17 11:27 PM  Result Value Ref Range Status   MRSA by PCR NEGATIVE NEGATIVE Final    Comment:        The GeneXpert MRSA Assay (FDA approved for NASAL specimens only), is one component of a comprehensive MRSA colonization surveillance program. It is not intended to diagnose MRSA infection nor to guide or monitor treatment for MRSA infections.          Radiology Studies: [name] Art/ven Flow Abd Pelv Doppler  Result Date: 04/18/2017 CLINICAL DATA:  56 year old male with recurrent ascites and prior TIPS EXAM: DUPLEX ULTRASOUND OF LIVER TECHNIQUE: Color and duplex Doppler ultrasound was performed to evaluate TIPS and the hepatic vessels. COMPARISON:  02/17/2017 FINDINGS: Portal Vein Velocities Main:  91 cm/sec TIPS: Velocity measured between 144 centimeter/second and 174 centimeter/second. Hepatic Vein Velocities Right:  59 cm/sec Middle:  67 cm/sec Left:  54 cm/sec Hepatic Artery Velocity:  86 cm/sec Splenic Vein Velocity:  38 cm/sec Varices: None visualize Ascites: Small volume ascites IMPRESSION: Duplex ultrasound demonstrates  patent TIPS. Small volume ascites persists. Electronically Signed   By: Corrie Mckusick D.O.   On: 04/18/2017 13:14   US Abdomen Limited Ruq  Result Date: 04/18/2017 CLINICAL DATA:  Ascites. EXAM: LIMITED ABDOMEN ULTRASOUND FOR ASCITES TECHNIQUE: Limited ultrasound survey for ascites was performed in all four abdominal quadrants. COMPARISON:  02/17/2017. FINDINGS: Mild ascites noted. No large enough fluid collection noted for safe paracentesis. Paracentesis not performed . IMPRESSION: Mild ascites.  Paracentesis not performed. Electronically Signed   By: Marcello Moores  Register   On: 04/18/2017 12:06        Scheduled Meds: . aspirin  325 mg Oral Daily  . enoxaparin (LOVENOX) injection  40 mg Subcutaneous QHS  . feeding supplement (ENSURE ENLIVE)  237 mL Oral TID BM  . fluticasone  2 spray  Each Nare Daily  . furosemide  20 mg Oral Daily  . gentamicin ointment  1 application Topical QHS  . insulin aspart  0-15 Units Subcutaneous TID WC  . insulin aspart  0-5 Units Subcutaneous QHS  . insulin aspart protamine- aspart  8 Units Subcutaneous BID WC  . lactulose  30 g Oral QID  . levothyroxine  50 mcg Oral QAC breakfast  . metoprolol succinate  25 mg Oral Daily  . modafinil  200 mg Oral Daily  . pantoprazole  40 mg Oral Daily  . rifaximin  550 mg Oral BID  . spironolactone  50 mg Oral Daily  . ursodiol  600 mg Oral BID   Continuous Infusions:   LOS: 5 days        Niasha Devins Gerome Apley, MD [facility] Pager [phone]  If 7PM-7AM, please contact night-coverage [email or web address] Password [facility] 04/20/2017, 8:54 AM

## 2017-04-21 DIAGNOSIS — K7290 Hepatic failure, unspecified without coma: Secondary | ICD-10-CM

## 2017-04-21 DIAGNOSIS — R740 Nonspecific elevation of levels of transaminase and lactic acid dehydrogenase [LDH]: Secondary | ICD-10-CM

## 2017-04-21 DIAGNOSIS — E119 Type 2 diabetes mellitus without complications: Secondary | ICD-10-CM

## 2017-04-21 DIAGNOSIS — R7401 Elevation of levels of liver transaminase levels: Secondary | ICD-10-CM

## 2017-04-21 DIAGNOSIS — Z95828 Presence of other vascular implants and grafts: Secondary | ICD-10-CM

## 2017-04-21 LAB — BASIC METABOLIC PANEL
ANION GAP: 9 (ref 5–15)
BUN: 24 mg/dL — ABNORMAL HIGH (ref 6–20)
CHLORIDE: 102 mmol/L (ref 101–111)
CO2: 27 mmol/L (ref 22–32)
Calcium: 8.9 mg/dL (ref 8.9–10.3)
Creatinine, Ser: 0.93 mg/dL (ref 0.61–1.24)
GFR calc non Af Amer: >60 mL/min (ref >60)
Glucose, Bld: 115 mg/dL — ABNORMAL HIGH (ref 65–99)
Potassium: 4.1 mmol/L (ref 3.5–5.1)
Sodium: 138 mmol/L (ref 135–145)

## 2017-04-21 LAB — HEPATIC FUNCTION PANEL
ALBUMIN: 2.4 g/dL — AB (ref 3.5–5.0)
ALK PHOS: 514 U/L — AB (ref 38–126)
ALT: 95 U/L — ABNORMAL HIGH (ref 17–63)
AST: 117 U/L — AB (ref 15–41)
BILIRUBIN TOTAL: 1.3 mg/dL — AB (ref 0.3–1.2)
Bilirubin, Direct: 0.6 mg/dL — ABNORMAL HIGH (ref 0.1–0.5)
Indirect Bilirubin: 0.7 mg/dL (ref 0.3–0.9)
Total Protein: 5.4 g/dL — ABNORMAL LOW (ref 6.5–8.1)

## 2017-04-21 LAB — GLUCOSE, CAPILLARY
GLUCOSE-CAPILLARY: 130 mg/dL — AB (ref 65–99)
GLUCOSE-CAPILLARY: 149 mg/dL — AB (ref 65–99)

## 2017-04-21 LAB — AMMONIA: Ammonia: 47 umol/L — ABNORMAL HIGH (ref 9–35)

## 2017-04-21 MED ORDER — LACTULOSE 10 GM/15ML PO SOLN: ORAL | 0 refills | Status: DC

## 2017-04-21 NOTE — Care Management Important Message (Signed)
Important Message  Patient Details  Name: [name] MRN: [id number] Date of Birth: Sep 30, 1960   Medicare Important Message Given:  Yes    Sherald Barge, RN 04/21/2017, 12:42 PM

## 2017-04-21 NOTE — Discharge Summary (Signed)
Physician Discharge Summary  [name] HBZ:[id number] DOB: 12-05-1960 DOA: 04/15/2017  PCP: Nicholas Kuba, MD  Admit date: 04/15/2017 Discharge date: 04/21/2017  Admitted From: Home  Disposition:  Home   Recommendations for Outpatient Follow-up:  1. Follow up with PCP in 1-2 weeks 2. Please obtain BMP/CBC in one week 3. Please follow up on the following pending results:  Home Health: Yes  Equipment/Devices: No   Discharge Condition: Stable  CODE STATUS:Full  Diet recommendation: Heart Healthy / Carb Modified    Brief/Interim Summary: 56 year old male who presented with confusion and falls. 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 Patient was noted to be increasingly confused over last 3-4 days prior to hospitalization, associated with difficulty walking, refusing his medications. Noted to have frequent falls at home. On initial physical examination blood pressure 140/96, heart rate 91, temperature 97.3, respiratory 23, moist mucous membranes, lungs clear to auscultation bilaterally, heart S1 and S2 present and rhythmic, abdomen was soft nontender, no masses or organomegaly. No lower extremity edema. Positive asterixis. Sodium 140, potassium 4.4, chloride 104, bicarbonate 20, glucose 130, BUN 28, creatinine 1.24, alkaline phosphatase 500,  albumin 2.6, AST 111, ALT 74, ammonia 84, total bilirubin is 1.8, white count 8.7, hemoglobin 13.5, hematocrit 41.8, platelets 255, INR 1.2, urinalysis with too numerous to count white cells, head CT with no acute abnormalities, stable mild diffuse cerebral and cerebellar atrophy. Chest x-ray with low lung volumes, cardiomegaly, pacemaker in place, no infiltrates. EKG with ectopic atrial rhythm, right bundle-branch block.   Patient admitted to the hospital with the working diagnosis of metabolic  encephalopathy related to acute on chronic liver failure, portosystemic hepatoencephalopathy.   1. Acute metabolic encephalopathy, portosystemic encephalopathy. Patient was admitted to the medical ward, he was placed on remote telemetry monitoring, frequent neuro checks. Patient was placed on aggressive lactulose therapy with significant improvement of his symptoms. His discharge ammonia is 47. His lactulose has been increased to 3 times daily, titrate to 2 to 3 bowel movements per day. Home health and home physical therapy has been arranged.  2. Chronic liver failure, due to cirrhosis status post TIPS. Patient responded well to supportive medical therapy, ultrasonography of the abdomen, with Doppler analysis showed patent TIPS with small volume of ascites. Patient was continued on diuretic therapy, with furosemide 20 mg daily and Aldactone 50 mg daily. Patient was placed on rifaximin. Discharge total bilirubin is 1.3, ALT 95, AST 117, alkaline phosphatase 514 (baseline 400-500). Discharge creatinine 0.93, potassium 4.1, serum bicarbonate 27.   3. Type 2 diabetes mellitus. Patient was placed on insulin signed scale for glucose coverage and monitoring, he also received 70/30 insulin, stable capillary glucose, over last 24 hours 118, 135, 113, 1:30, 149. Patient will resume 75/25 insulin regimen.  4. Hypothyroidism. Continue levothyroxine.  5. Hypertension. Continue metoprolol, blood pressure systolic 10-175.   6. Atrial fibrillation (chronic). EKG with ectopic atrial rhythm, patient with pacemaker in place, continue metoprolol for rate control. No anticoagulation due to chronic liver failure, continue antiplatelet therapy with low-dose aspirin.   7. Moderate to severe aortic stenosis. Continue close follow-up as an outpatient. Denies any angina, syncope or signs of heart failure.   Discharge Diagnoses:  Principal Problem:   Hepatic encephalopathy ([name]) Active Problems:   Type 2 diabetes mellitus  (HCC)   GASTROESOPHAGEAL REFLUX DISEASE   Hypertension   Sleep apnea  Atrial fibrillation (HCC)   Ascites   Elevated LFTs   Liver cirrhosis secondary to NASH (HCC)   Decompensated liver disease (HCC)   S/P TIPS (transjugular intrahepatic portosystemic shunt)   Transaminitis    Discharge Instructions   Allergies as of 04/21/2017      Reactions   Penicillins Swelling   SWELLING OF AIRWAY PATIENT HAD A PCN REACTION WITH IMMEDIATE RASH, FACIAL/TONGUE/THROAT SWELLING, SOB, OR LIGHTHEADEDNESS WITH HYPOTENSION:  #  #  #  YES  #  #  #  Has patient had a PCN reaction causing severe rash involving mucus membranes or skin necrosis: No Has patient had a PCN reaction that required hospitalization No Has patient had a PCN reaction occurring within the last 10 years: No   Enalapril Cough   Metformin And Related Diarrhea   Eliquis [apixaban] Other (See Comments)   DOSE RELATED BLEEDING      Medication List    TAKE these medications   acetaminophen 325 MG tablet Commonly known as:  TYLENOL Take 650 mg by mouth every 6 (six) hours as needed for mild pain.   albuterol 108 (90 Base) MCG/ACT inhaler Commonly known as:  PROVENTIL HFA;VENTOLIN HFA Inhale 2 puffs into the lungs every 6 (six) hours as needed for wheezing or shortness of breath. What changed:  Another medication with the same name was removed. Continue taking this medication, and follow the directions you see here.   aspirin 325 MG EC tablet Take 325 mg by mouth daily.   CENTRUM tablet Take 1 tablet by mouth daily.   cetirizine 10 MG tablet Commonly known as:  ZYRTEC Take 10 mg by mouth daily.   feeding supplement (ENSURE ENLIVE) Liqd Take 237 mLs by mouth 3 (three) times daily between meals.   Fish Oil 1000 MG Caps Take 1,000 mg by mouth every other day.   furosemide 20 MG tablet Commonly known as:  LASIX Take 1 tablet (20 mg total) by mouth daily.   gentamicin ointment 0.1 % Commonly known as:  GARAMYCIN Apply  1 application topically at bedtime. Inside nose   GLUCOSAMINE CHONDR 1500 COMPLX PO Take 1 tablet by mouth every morning.   HUMALOG MIX 75/25 KWIKPEN (75-25) 100 UNIT/ML Kwikpen Generic drug:  Insulin Lispro Prot & Lispro Inject 8 Units into the skin 2 (two) times daily. 15 units in the morning and 15 units in the evening What changed:  how much to take  additional instructions   lactulose 10 GM/15ML solution Commonly known as:  CHRONULAC 10cc (2 teaspoons) three times daily; Titrate to 2-3 semi formed stools a day What changed:  additional instructions   LEVOXYL 50 MCG tablet Generic drug:  levothyroxine Take 50 mcg by mouth daily before breakfast.   metoprolol succinate 25 MG 24 hr tablet Commonly known as:  TOPROL-XL Take 25 mg by mouth daily.   modafinil 200 MG tablet Commonly known as:  PROVIGIL Take 200 mg by mouth daily.   mometasone 50 MCG/ACT nasal spray Commonly known as:  NASONEX Place 2 sprays into the nose daily as needed (FOR CONGESTION/ALLERGIES).   nitroGLYCERIN 0.4 MG SL tablet Commonly known as:  NITROSTAT Place 1 tablet (0.4 mg total) under the tongue every 5 (five) minutes as needed for chest pain.   NON FORMULARY Bipap 16.0/e   omeprazole 40 MG capsule Commonly known as:  PRILOSEC TAKE 1 CAPSULE BY MOUTH ONCE DAILY FOR REFLUX.   rifaximin 550 MG Tabs tablet Commonly known as:  XIFAXAN Take 1 tablet (  550 mg total) by mouth 2 (two) times daily.   spironolactone 50 MG tablet Commonly known as:  ALDACTONE Take 1 tablet (50 mg total) by mouth daily.   ursodiol 500 MG tablet Commonly known as:  ACTIGALL TAKE 1 TABLET BY MOUTH TWICE DAILY      Follow-up Information    [facility].   Contact information: [place] [address] [phone]        Annitta Needs, NP Follow up in 4 week(s).   Specialty:  Gastroenterology Why:  office will call  Contact information: [address] [place] [address] [phone]        Nicholas Kuba, MD On 04/27/2017.   Specialty:  Family Medicine Why:  at 8:20 am Contact information: [place] [address] [phone]          Allergies  Allergen Reactions  . Penicillins Swelling    SWELLING OF AIRWAY  PATIENT HAD A PCN REACTION WITH IMMEDIATE RASH, FACIAL/TONGUE/THROAT SWELLING, SOB, OR LIGHTHEADEDNESS WITH HYPOTENSION:  #  #  #  YES  #  #  #  Has patient had a PCN reaction causing severe rash involving mucus membranes or skin necrosis: No Has patient had a PCN reaction that required hospitalization No Has patient had a PCN reaction occurring within the last 10 years: No  . Enalapril Cough  . Metformin And Related Diarrhea  . Eliquis [Apixaban] Other (See Comments)    DOSE RELATED BLEEDING    Consultations: Gastroenterology    Procedures/Studies: Dg Chest 2 View  Result Date: 04/15/2017 CLINICAL DATA:  Altered mental status. Fall. Left parietal hematoma. EXAM: CHEST  2 VIEW COMPARISON:  None. FINDINGS: The heart size is exaggerated by low lung volumes. Pacing wires are stable. There is no edema or effusion. No focal airspace disease is present. Degenerative changes are noted at the shoulders. IMPRESSION: 1. Low lung volumes. 2. No acute cardiopulmonary disease. Electronically Signed   By: San Morelle M.D.   On: 04/15/2017 15:13   Ct Head Wo Contrast  Result Date: 04/15/2017 CLINICAL DATA:  Left parietal hematoma after falling at home. EXAM: CT HEAD WITHOUT CONTRAST TECHNIQUE: Contiguous axial images were obtained from the base of the skull through the vertex without intravenous contrast. COMPARISON:  01/27/2017. FINDINGS: Brain: Diffusely enlarged ventricles and subarachnoid spaces. Patchy white matter low density in both cerebral hemispheres. No intracranial hemorrhage, mass lesion or CT evidence of acute infarction. Vascular: No hyperdense vessel or unexpected calcification.  Skull: Normal. Negative for fracture or focal lesion. Sinuses/Orbits: Aplastic frontal sinuses. Stable small amount of bilateral ethmoid air cell opacification. Decreased right mastoid air cell opacification. Other: None. IMPRESSION: 1. No acute abnormality. 2. Stable mild diffuse cerebral and cerebellar atrophy. 3. Stable mild to moderate chronic small vessel white matter ischemic changes in both cerebral hemispheres. 4. Right mastoid air cell opacification with improvement. 5. Stable mild chronic bilateral ethmoid sinusitis. Electronically Signed   By: Claudie Revering M.D.   On: 04/15/2017 15:27   [name] Art/ven Flow Abd Pelv Doppler  Result Date: 04/18/2017 CLINICAL DATA:  56 year old male with recurrent ascites and prior TIPS EXAM: DUPLEX ULTRASOUND OF LIVER TECHNIQUE: Color and duplex Doppler ultrasound was performed to evaluate TIPS and the hepatic vessels. COMPARISON:  02/17/2017 FINDINGS: Portal Vein Velocities Main:  91 cm/sec TIPS: Velocity measured between 144 centimeter/second and 174 centimeter/second. Hepatic Vein Velocities Right:  59 cm/sec Middle:  67 cm/sec Left:  54  cm/sec Hepatic Artery Velocity:  86 cm/sec Splenic Vein Velocity:  38 cm/sec Varices: None visualize Ascites: Small volume ascites IMPRESSION: Duplex ultrasound demonstrates patent TIPS. Small volume ascites persists. Electronically Signed   By: Corrie Mckusick D.O.   On: 04/18/2017 13:14   US Abdomen Limited Ruq  Result Date: 04/18/2017 CLINICAL DATA:  Ascites. EXAM: LIMITED ABDOMEN ULTRASOUND FOR ASCITES TECHNIQUE: Limited ultrasound survey for ascites was performed in all four abdominal quadrants. COMPARISON:  02/17/2017. FINDINGS: Mild ascites noted. No large enough fluid collection noted for safe paracentesis. Paracentesis not performed . IMPRESSION: Mild ascites.  Paracentesis not performed. Electronically Signed   By: Marcello Moores  Register   On: 04/18/2017 12:06        Subjective: Patient feeling better, no nausea or vomiting,  tolerating lactulose well with more than 3 bowel movements per day. No chest pain or dyspnea, has been ambulating with help of his wife.   Discharge Exam: Vitals:   04/20/17 2100 04/21/17 0609  BP: 123/77 97/68  Pulse: 89 82  Resp: 20 20  Temp:  97.8 F (36.6 C)   Vitals:   04/20/17 1500 04/20/17 2025 04/20/17 2100 04/21/17 0609  BP: 118/68  123/77 97/68  Pulse: 81  89 82  Resp: 20  20 20   Temp: 98 F (36.7 C)   97.8 F (36.6 C)  TempSrc: Oral   Oral  SpO2: 96% 92% 91% 93%  Weight:      Height:        General: Pt is alert, awake, not in acute distress E ENT. Mild pallor, no icterus, oral mucosa moist.  Cardiovascular: RRR, S1/S2 +, no rubs, no gallops Respiratory: CTA bilaterally, no wheezing, no rhonchi Abdominal: Soft, NT, ND, bowel sounds + Extremities: no edema, no cyanosis    The results of significant diagnostics from this hospitalization (including imaging, microbiology, ancillary and laboratory) are listed below for reference.     Microbiology: Recent Results (from the past 240 hour(s))  MRSA PCR Screening     Status: None   Collection Time: 04/15/17 11:27 PM  Result Value Ref Range Status   MRSA by PCR NEGATIVE NEGATIVE Final    Comment:        The GeneXpert MRSA Assay (FDA approved for NASAL specimens only), is one component of a comprehensive MRSA colonization surveillance program. It is not intended to diagnose MRSA infection nor to guide or monitor treatment for MRSA infections.      Labs: BNP (last 3 results) No results for input(s): BNP in the last 8760 hours. Basic Metabolic Panel:  Recent Labs Lab 04/16/17 0621 04/17/17 0434 04/18/17 0630 04/19/17 0627 04/21/17 0545  NA 141 142 140 139 138  K 4.0 3.7 3.9 4.2 4.1  CL 107 107 104 105 102  CO2 25 26 27 25 27   GLUCOSE 125* 67 101* 111* 115*  BUN 24* 22* 26* 25* 24*  CREATININE 0.90 0.91 1.00 0.94 0.93  CALCIUM 8.9 8.5* 8.4* 8.4* 8.9  MG 2.0  --   --   --   --    Liver Function  Tests:  Recent Labs Lab 04/16/17 0621 04/17/17 0434 04/18/17 0630 04/19/17 0627 04/21/17 0545  AST 160* 259* 219* 169* 117*  ALT 88* 140* 138* 116* 95*  ALKPHOS 524* 558* 633* 548* 514*  BILITOT 1.6* 1.4* 1.7* 1.4* 1.3*  PROT 5.7* 5.3* 5.6* 5.4* 5.4*  ALBUMIN 2.5* 2.2* 2.5* 2.5* 2.4*   No results for input(s): LIPASE, AMYLASE in the last 168 hours.  Recent Labs Lab 04/15/17 1450 04/16/17 0621 04/17/17 0435 04/20/17 0958 04/21/17 0549  AMMONIA 84* 79* 64* 53* 47*   CBC:  Recent Labs Lab 04/15/17 1450 04/16/17 0621  WBC 8.7 7.4  NEUTROABS 6.6  --   HGB 13.5 13.1  HCT 41.8 39.6  MCV 99.8 100.0  PLT 255 241   Cardiac Enzymes: No results for input(s): CKTOTAL, CKMB, CKMBINDEX, TROPONINI in the last 168 hours. BNP: Invalid input(s): POCBNP CBG:  Recent Labs Lab 04/20/17 1106 04/20/17 1614 04/20/17 2002 04/21/17 0746 04/21/17 0953  GLUCAP 118* 135* 113* 130* 149*   D-Dimer No results for input(s): DDIMER in the last 72 hours. Hgb A1c No results for input(s): HGBA1C in the last 72 hours. Lipid Profile No results for input(s): CHOL, HDL, LDLCALC, TRIG, CHOLHDL, LDLDIRECT in the last 72 hours. Thyroid function studies No results for input(s): TSH, T4TOTAL, T3FREE, THYROIDAB in the last 72 hours.  Invalid input(s): FREET3 Anemia work up No results for input(s): VITAMINB12, FOLATE, FERRITIN, TIBC, IRON, RETICCTPCT in the last 72 hours. Urinalysis    Component Value Date/Time   COLORURINE YELLOW 04/16/2017 0811   APPEARANCEUR CLOUDY (A) 04/16/2017 0811   LABSPEC 1.014 04/16/2017 0811   PHURINE 5.0 04/16/2017 0811   GLUCOSEU NEGATIVE 04/16/2017 0811   HGBUR SMALL (A) 04/16/2017 0811   BILIRUBINUR NEGATIVE 04/16/2017 0811   KETONESUR NEGATIVE 04/16/2017 0811   PROTEINUR NEGATIVE 04/16/2017 0811   UROBILINOGEN 0.2 05/13/2015 1315   NITRITE NEGATIVE 04/16/2017 0811   LEUKOCYTESUR LARGE (A) 04/16/2017 0811   Sepsis Labs Invalid input(s):  PROCALCITONIN,  WBC,  LACTICIDVEN Microbiology Recent Results (from the past 240 hour(s))  MRSA PCR Screening     Status: None   Collection Time: 04/15/17 11:27 PM  Result Value Ref Range Status   MRSA by PCR NEGATIVE NEGATIVE Final    Comment:        The GeneXpert MRSA Assay (FDA approved for NASAL specimens only), is one component of a comprehensive MRSA colonization surveillance program. It is not intended to diagnose MRSA infection nor to guide or monitor treatment for MRSA infections.      Time coordinating discharge: 45  minutes  SIGNED:   Tawni Millers, MD  [facility] 04/21/2017, 10:24 AM Pager   If 7PM-7AM, please contact night-coverage [email or web address] Password [email or web address]

## 2017-04-21 NOTE — Progress Notes (Signed)
Patient discharged with instructions, prescription, and care notes.  Verbalized understanding via teach back.  IV was removed and the site was WNL. Patient voiced no further complaints or concerns at the time of discharge.  Appointments scheduled per instructions.  Patient left the floor via w/c family  And staff in stable condition. 

## 2017-04-21 NOTE — Progress Notes (Signed)
REVIEWED. D/C HOME. LACTULOSE 3-4 TIMES A DAY TO HAVE 3-4 BMs A DAY.   Subjective: Patient lying in bed, makes good eye contact and converses appropriately. Cannot remember my name, thought it was 2008 but knows his full name and where he's at. Denies abdominal pain, N/V. Had a bowel movement this morning and 2 yesterday. Denies hematochezia or melena. No other upper or lower GI complaints.  Objective: Vital signs in last 24 hours: Temp:  [97.8 F (36.6 C)-98 F (36.7 C)] 97.8 F (36.6 C) (08/02 0609) Pulse Rate:  [81-89] 82 (08/02 0609) Resp:  [20] 20 (08/02 0609) BP: (97-123)/(68-77) 97/68 (08/02 0609) SpO2:  [91 %-96 %] 93 % (08/02 0609) Last BM Date: 04/20/17 General:   Alert and oriented, pleasant Eyes:  No icterus, sclera clear. Conjuctiva pink.  Heart:  S1, S2 present, systolic murmur noted.  Lungs: Clear to auscultation bilaterally, without wheezing, rales, or rhonchi.  Abdomen:  Bowel sounds present, rounded but soft, non-tender, non-distended. No HSM or hernias noted. No rebound or guarding. Msk:  Symmetrical without gross deformities. Pulses:  Normal bilateral DP pulses noted. Extremities:  Without clubbing or edema. Neurologic:  Alert and  oriented x2; otherwise grossly normal neurologically. Psych:  Alert and cooperative. Normal mood and affect.  Intake/Output from previous day: 08/01 0701 - 08/02 0700 In: 960 [P.O.:960] Out: -  Intake/Output this shift: No intake/output data recorded.  Lab Results: No results for input(s): WBC, HGB, HCT, PLT in the last 72 hours. BMET  Recent Labs  04/19/17 0627 04/21/17 0545  NA 139 138  K 4.2 4.1  CL 105 102  CO2 25 27  GLUCOSE 111* 115*  BUN 25* 24*  CREATININE 0.94 0.93  CALCIUM 8.4* 8.9   LFT  Recent Labs  04/19/17 0627 04/21/17 0545  PROT 5.4* 5.4*  ALBUMIN 2.5* 2.4*  AST 169* 117*  ALT 116* 95*  ALKPHOS 548* 514*  BILITOT 1.4* 1.3*  BILIDIR  --  0.6*  IBILI  --  0.7   PT/INR  Recent Labs  04/18/17 1501  LABPROT 15.9*  INR 1.26   Hepatitis Panel No results for input(s): HEPBSAG, HCVAB, HEPAIGM, HEPBIGM in the last 72 hours.   Studies/Results: No results found.  Assessment: 56 year old male with extensive medical history to include NASH cirrhosis, empiric treatment for AMA negative PBC on Urso, s/p TIPS in April 2018, presenting with encephalopathy in setting of non-compliant outpatient therapy with prescribed medications. TIPS remains patent. Fluctuating mental status noted 2 days ago, with neurology consult appreciated. No additional inpatient neurology work-up suggested. Some improvement in mental status noted yesterday with increased dose of lactulose.  Labs today show ammonia declined to 47 from 53, LFTs improved some. Clinically improved today. Discussed need to take medications regularly. His wife is planning to quit her PT job to stay at home with her husband to better monitor him and ensure he takes his medications as directed. Discussed mechanics of TIPS and possibility of worsening HE afterward. On the plus side, he has not needed a paracentesis since his TIPS and his abdomen is soft today with no noted distension or tense ascites.   Plan: 1. Continue Urso, Lactulose, and Xifaxan 2. Supportive care 3. Follow-up in our office in 2-4 weeks 4. Call if any worsening confusion or other worsening liver symptoms   Thank you for allowing [name] to participate in the care of Nicholas Napoleon, DNP, AGNP-C Adult & Gerontological Nurse Practitioner [facility]  LOS: 6 days    04/21/2017, 8:25 AM

## 2017-04-21 NOTE — Care Management Note (Signed)
Case Management Note  Patient Details  Name: [name] MRN: [id number] Date of Birth: 12/15/1960  Expected Discharge Date:  04/21/17               Expected Discharge Plan:  [place]  In-House Referral:  NA  Discharge planning Services  CM Consult  Post Acute Care Choice:  Home Health Choice offered to:  Patient, Spouse  HH Arranged:  RN, PT, Nurse's Aide [place] Agency:  [place]  Status of Service:  Completed, signed off  If discussed at [place] of Stay Meetings, dates discussed:  04/21/2017  Additional Comments: DC home today with [facility] nursing, aid, PT. Cm discussed with pt/wife that [facility] has 48 hrs to make first visit. Pt ambulating ind in hallway. [facility] rep, Vaughan Basta, aware of DC today.   Sherald Barge, RN 04/21/2017, 12:43 PM

## 2017-05-03 ENCOUNTER — Other Ambulatory Visit (HOSPITAL_COMMUNITY)
Admission: RE | Admit: 2017-05-03 | Discharge: 2017-05-03 | Disposition: A | Discharge status: Home / Self Care | Payer: Medicare Other | Source: Other Acute Inpatient Hospital | Attending: Family Medicine | Admitting: Family Medicine

## 2017-05-03 DIAGNOSIS — K7469 Other cirrhosis of liver: Secondary | ICD-10-CM | POA: Diagnosis present

## 2017-05-03 LAB — BASIC METABOLIC PANEL
Anion gap: 11 (ref 5–15)
BUN: 32 mg/dL — AB (ref 6–20)
CALCIUM: 9.2 mg/dL (ref 8.9–10.3)
CO2: 28 mmol/L (ref 22–32)
CREATININE: 1.3 mg/dL — AB (ref 0.61–1.24)
Chloride: 103 mmol/L (ref 101–111)
GFR calc Af Amer: >60 mL/min (ref >60)
GFR calc non Af Amer: 60 mL/min — ABNORMAL LOW (ref >60)
Glucose, Bld: 144 mg/dL — ABNORMAL HIGH (ref 65–99)
Potassium: 4.2 mmol/L (ref 3.5–5.1)
SODIUM: 142 mmol/L (ref 135–145)

## 2017-05-16 ENCOUNTER — Telehealth: Payer: Self-pay

## 2017-05-16 NOTE — Telephone Encounter (Signed)
Needs 3 soft BMs daily if possible. May titrate lactulose dosing 2-3 times per day. If needs refill let me know.

## 2017-05-16 NOTE — Telephone Encounter (Signed)
Pts wife is aware 

## 2017-05-16 NOTE — Telephone Encounter (Signed)
Pt's wife called with a question about his lactulose. She said when he was in the hospital recently he was told to take Lactulose three times daily.  When they got his prescription it said twice a day.  She only gave it to him bid over the week end. I told her if they told her three times a day that is fine.  His refill could have been from an older prescription. I will let Roseanne Kaufman, NP know who saw him in the office last. She can send in a new bottle for the three times a day dosing. She said he did have 2 BM's daily over the week-end.  Please advise!

## 2017-05-16 NOTE — Telephone Encounter (Signed)
LMOM to call.

## 2017-05-25 ENCOUNTER — Encounter: Payer: Self-pay | Admitting: Gastroenterology

## 2017-05-25 ENCOUNTER — Other Ambulatory Visit: Payer: Self-pay

## 2017-05-25 ENCOUNTER — Telehealth: Payer: Self-pay | Admitting: Gastroenterology

## 2017-05-25 ENCOUNTER — Ambulatory Visit (INDEPENDENT_AMBULATORY_CARE_PROVIDER_SITE_OTHER): Payer: Medicare Other | Admitting: Gastroenterology

## 2017-05-25 VITALS — BP 105/73 | HR 89 | Temp 97.8°F | Ht 63.0 in | Wt 150.8 lb

## 2017-05-25 DIAGNOSIS — K7460 Unspecified cirrhosis of liver: Secondary | ICD-10-CM | POA: Diagnosis not present

## 2017-05-25 DIAGNOSIS — K7581 Nonalcoholic steatohepatitis (NASH): Secondary | ICD-10-CM

## 2017-05-25 DIAGNOSIS — K429 Umbilical hernia without obstruction or gangrene: Secondary | ICD-10-CM

## 2017-05-25 MED ORDER — LACTULOSE 10 GM/15ML PO SOLN: ORAL | 5 refills | Status: AC

## 2017-05-25 MED ORDER — RIFAXIMIN 550 MG PO TABS: 550.0000 mg | ORAL_TABLET | Freq: Two times a day (BID) | ORAL | 3 refills | Status: DC

## 2017-05-25 NOTE — Assessment & Plan Note (Signed)
56 year old male with history of NASH cirrhosis, history of ETOH use, treated empirically for AMA negative PBC with Urso, undergoing TIPS 01/12/17. Remains on low-dose lasix 20 mg and aldactone 50 mg daily, lactulose 2-3 times per day, Xifaxin BID. At baseline since hospitalization for encephalopathy. Needs [name] in several months and actually due for EGD this year as well. Protruding umbilical hernia noted on exam. Discussed avoidance of lifting, pushing, pulling, as he has been cutting the grass and weed-eating daily. Would not likely be a good surgical candidate, but wife is concerned about hernia. Will have him establish with General Surgery and discuss options going forward; I highly doubt hernia repair would be undertaken. In interim, supportive measures. Check CMP, INR now. Return in 2 months to arrange EGD and US abdomen.

## 2017-05-25 NOTE — Telephone Encounter (Signed)
RESCHEDULED TO October AND LEFT PATIENT MESSAGE

## 2017-05-25 NOTE — Progress Notes (Signed)
Referring Provider: Petra Kuba, MD Primary Care Physician:  Petra Kuba, MD Primary GI: Dr. Gala Romney   Chief Complaint  Patient presents with  . Cirrhosis    HPI:   [name] is a 56 y.o. male presenting today with a history of NASH cirrhosis, ETOH use, treated empirically for AMA negative PBC starting approximately Nov 2017. Underwent TIPS utilizing a gun-sight technique on 01/12/17. Presented with encephalopathy in July 2018 and admitted. Non-compliant with lactulose therapy at that time. TIPS has remained patent. Low dose lasix 20 mg and aldactone 50 mg sine TIPS procedure. No need for para since TIPS. Needs routine [name] in Nov 2018. Needs follow-up with IR again in October.   Taking about 2 doses of lactulose and achieving 3 soft BMs a day. Eating better than he was. Wife present with him today. No abdominal pain. Has protruding umbilical hernia when sitting and standing. No pain. Wife concerned about hernia. Good appetite. No lower extremity edema. Needs more refills on lactulose and xifaxan. Wife states at times he is mildly confused but overall at baseline.    Past Medical History:  Diagnosis Date  . Alcohol use   . Anemia   . Aortic stenosis    mild  . Arthritis   . Atrial flutter ([place])   . Benign hypertrophy of prostate    with urinary retention; repaired hypospadia; transurethral resection of the prostate   . Cardiac conduction disorder    status post pacemaker implantation in 1995 generator replaced 2005  . Chest pain 2007, 2009   cardiac cath > normal coronary arterie; nl left ventricular function  . CHF (congestive heart failure) ([place])   . Cirrhosis ([place]) 11/2016  . Congenital deafness   . COPD (chronic obstructive pulmonary disease) ([place])   . Diabetes mellitus    +Insulin  . Dyspnea   . Edema   . GERD (gastroesophageal reflux disease)    status post multiple dilatations for stricture  . 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 Hepatitis    Thinks it was B  . History of kidney stones   . HTN (hypertension)   . Hyperlipidemia    statin discontinued due to abn LFTs  . Hypothyroidism   . Mitral regurgitation    insignificant  . Obesity   . Obesity 4/08   BMI= 40  . Pelvic mass    identified in 2009, stable 2010  . PONV (postoperative nausea and vomiting)   . Presence of permanent cardiac pacemaker   . Sleep apnea    BiPAP and continuous oxygen  . Syncope   . Thyroid disease     Past Surgical History:  Procedure Laterality Date  . A-V CARDIAC PACEMAKER INSERTION  1995  . COLONOSCOPY  2008  . COLONOSCOPY N/A 06/23/2015   KPT:WSFKCLE diverticulosis  . ESOPHAGOGASTRODUODENOSCOPY N/A 06/23/2015   XNT:ZGYF portal gastropathy  . IR GENERIC HISTORICAL  12/14/2016   IR PARACENTESIS 12/14/2016 Sandi Mariscal, MD [facility]  . IR GENERIC HISTORICAL  12/14/2016   IR TIPS 12/14/2016 Sandi Mariscal, MD [facility]  . IR PARACENTESIS  01/12/2017  . IR RADIOLOGIST EVAL & MGMT  01/06/2017  . IR RADIOLOGIST EVAL & MGMT  02/08/2017  . IR RADIOLOGIST EVAL & MGMT  11/30/2016  . IR TIPS  01/12/2017  . NASAL SINUS SURGERY     partially removed  . ORCHIECTOMY  1990   bilateral; ?neoplasm  .  PACEMAKER INSERTION  05/2004; 10/04/2013   MDT dual chamber pacemaker; gen change 10/04/2013 (MDT ADDRL1)  . PERMANENT PACEMAKER GENERATOR CHANGE N/A 10/04/2013   Procedure: PERMANENT PACEMAKER GENERATOR CHANGE;  Surgeon: Evans Lance, MD;  Location: [facility] CATH LAB;  Service: Cardiovascular;  Laterality: N/A;  . RADIOLOGY WITH ANESTHESIA N/A 12/14/2016   Procedure: TIPS;  Surgeon: Sandi Mariscal, MD;  Location: [place];  Service: Radiology;  Laterality: N/A;  . RADIOLOGY WITH ANESTHESIA N/A 01/12/2017   Procedure: TIPS;  Surgeon: Sandi Mariscal, MD;  Location: [place];  Service: Radiology;  Laterality: N/A;  . REPAIR HYPOSPADIAS W/ URETHROPLASTY    . TONSILLECTOMY AND ADENOIDECTOMY    . TRANSURETHRAL  RESECTION OF PROSTATE     for BPH    Current Outpatient Prescriptions  Medication Sig Dispense Refill  . acetaminophen (TYLENOL) 325 MG tablet Take 650 mg by mouth every 6 (six) hours as needed for mild pain.    Marland Kitchen albuterol (PROVENTIL HFA;VENTOLIN HFA) 108 (90 Base) MCG/ACT inhaler Inhale 2 puffs into the lungs every 6 (six) hours as needed for wheezing or shortness of breath.    Marland Kitchen aspirin 325 MG EC tablet Take 325 mg by mouth daily.    . cetirizine (ZYRTEC) 10 MG tablet Take 10 mg by mouth daily.    . feeding supplement, ENSURE ENLIVE, (ENSURE ENLIVE) LIQD Take 237 mLs by mouth 3 (three) times daily between meals. 237 mL 12  . furosemide (LASIX) 20 MG tablet Take 1 tablet (20 mg total) by mouth daily. 90 tablet 3  . gentamicin ointment (GARAMYCIN) 0.1 % Apply 1 application topically at bedtime. Inside nose     . Glucosamine-Chondroit-Vit C-Mn (GLUCOSAMINE CHONDR 1500 COMPLX PO) Take 1 tablet by mouth every morning.    Marland Kitchen HUMALOG MIX 75/25 KWIKPEN (75-25) 100 UNIT/ML Kwikpen Inject 8 Units into the skin 2 (two) times daily. 15 units in the morning and 15 units in the evening (Patient taking differently: Inject 0-8 Units into the skin 2 (two) times daily. Takes 8 units every morning. Takes a dose in the evening using a sliding scale. 4units in evening) 10 mL 0  . lactulose (CHRONULAC) 10 GM/15ML solution 10cc (2 teaspoons) three times daily; Titrate to 2-3 semi formed stools a day 473 mL 0  . levothyroxine (LEVOXYL) 50 MCG tablet Take 50 mcg by mouth daily before breakfast.     . metoprolol succinate (TOPROL-XL) 25 MG 24 hr tablet Take 25 mg by mouth daily.    . modafinil (PROVIGIL) 200 MG tablet Take 200 mg by mouth daily.     . mometasone (NASONEX) 50 MCG/ACT nasal spray Place 2 sprays into the nose daily as needed (FOR CONGESTION/ALLERGIES).    . Multiple Vitamins-Minerals (CENTRUM) tablet Take 1 tablet by mouth daily.      . nitroGLYCERIN (NITROSTAT) 0.4 MG SL tablet Place 1 tablet (0.4 mg  total) under the tongue every 5 (five) minutes as needed for chest pain. 25 tablet 3  . NON FORMULARY Bipap 16.0/e    . Omega-3 Fatty Acids (FISH OIL) 1000 MG CAPS Take 1,000 mg by mouth every other day.     Marland Kitchen omeprazole (PRILOSEC) 40 MG capsule TAKE 1 CAPSULE BY MOUTH ONCE DAILY FOR REFLUX. 30 capsule 1  . rifaximin (XIFAXAN) 550 MG TABS tablet Take 1 tablet (550 mg total) by mouth 2 (two) times daily. 60 tablet 0  . spironolactone (ALDACTONE) 50 MG tablet Take 1 tablet (50 mg total) by mouth daily. 90 tablet 3  .  ursodiol (ACTIGALL) 500 MG tablet TAKE 1 TABLET BY MOUTH TWICE DAILY 60 tablet 11   No current facility-administered medications for this visit.     Allergies as of 05/25/2017 - Review Complete 05/25/2017  Allergen Reaction Noted  . Penicillins Swelling   . Enalapril Cough 04/15/2011  . Metformin and related Diarrhea 07/30/2013  . Eliquis [apixaban] Other (See Comments) 03/18/2014    Family History  Problem Relation Age of Onset  . COPD Mother   . Hypertension Mother   . Congestive Heart Failure Mother   . Pneumonia Father   . Heart disease Other   . Hypertension Other   . Colon cancer Neg Hx     Social History   Social History  . Marital status: Married    Spouse name: N/A  . Number of children: N/A  . Years of education: N/A   Social History Main Topics  . Smoking status: Never Smoker  . Smokeless tobacco: Never Used     Comment: tobacco use- no   . Alcohol use No     Comment: Hx. of alcohol use  . Drug use: No  . Sexual activity: Yes   Other Topics Concern  . None   Social History Narrative   Part time.     Review of Systems: As mentioned in HPI   Physical Exam: BP 105/73   Pulse 89   Temp 97.8 F (36.6 C) (Oral)   Ht 5\' 3"  (1.6 m)   Wt 150 lb 12.8 oz (68.4 kg)   BMI 26.71 kg/m  General:   Alert and oriented. No distress noted. Pleasant and cooperative.  Head:  Normocephalic and atraumatic. Eyes:  Conjuctiva clear without scleral  icterus. Mouth:  Oral mucosa pink and moist.  Abdomen:  +BS, soft, non-tender and non-distended. Protruding umbilical hernia when sitting and standing, resolves when laying down.  Msk:  Symmetrical without gross deformities. Normal posture. Extremities:  Without edema. Chronic venous stasis  Neurologic:  Alert and  oriented x4, no obvious asterixis on exam Psych:  Alert and cooperative. Normal mood and affect.

## 2017-05-25 NOTE — Patient Instructions (Signed)
Please have blood work done. We may need to adjust the fluid pills  I sent Xifaxin to the pharmacy to take twice a day. Continue the lactulose 2-3 times per day to have 3-4 soft bowel movements a day.   I don't think you would be a candidate for surgery, but I am referring you to Dr. Arnoldo Morale to evaluate your hernia further. Monitor for severe pain, protrusion that does not go back in, and avoid heavy lifting, pushing, pulling.   We will see you in 3 months!

## 2017-05-25 NOTE — Telephone Encounter (Signed)
Can we actually bump up his appt to see me to late October? He needs an EGD before end of year and to arrange ultrasound.

## 2017-05-26 LAB — COMPLETE METABOLIC PANEL WITH GFR
AG RATIO: 1.3 (calc) (ref 1.0–2.5)
ALT: 66 U/L — AB (ref 9–46)
AST: 86 U/L — AB (ref 10–35)
Albumin: 2.9 g/dL — ABNORMAL LOW (ref 3.6–5.1)
Alkaline phosphatase (APISO): 559 U/L — ABNORMAL HIGH (ref 40–115)
BILIRUBIN TOTAL: 1.6 mg/dL — AB (ref 0.2–1.2)
BUN/Creatinine Ratio: 31 (calc) — ABNORMAL HIGH (ref 6–22)
BUN: 35 mg/dL — ABNORMAL HIGH (ref 7–25)
CHLORIDE: 101 mmol/L (ref 98–110)
CO2: 28 mmol/L (ref 20–32)
Calcium: 9.3 mg/dL (ref 8.6–10.3)
Creat: 1.13 mg/dL (ref 0.70–1.33)
GFR, EST AFRICAN AMERICAN: 83 mL/min/{1.73_m2} (ref >=60)
GFR, Est Non African American: 72 mL/min/{1.73_m2} (ref >=60)
GLOBULIN: 2.3 g/dL (ref 1.9–3.7)
Glucose, Bld: 124 mg/dL (ref 65–139)
POTASSIUM: 5 mmol/L (ref 3.5–5.3)
SODIUM: 141 mmol/L (ref 135–146)
TOTAL PROTEIN: 5.2 g/dL — AB (ref 6.1–8.1)

## 2017-05-26 LAB — PROTIME-INR
INR: 1.1
Prothrombin Time: 11.6 s — ABNORMAL HIGH (ref 9.0–11.5)

## 2017-05-26 NOTE — Progress Notes (Signed)
cc'ed to pcp °

## 2017-05-27 IMAGING — CT CT HEAD W/O CM
3 series · 15 of 46 positions shown, 18 images · non-contrast
Comparison: 12/09/2006.

CLINICAL DATA: Fell 1 week ago and hit top of head on cabinet.
Slurred speech.

EXAM:
CT HEAD WITHOUT CONTRAST
TECHNIQUE: Contiguous axial images were obtained from the base of the skull
through the vertex without intravenous contrast.

[Series 2: head trauma wo · axial · 0.47mm/px · z∈[+150,+270]mm · 9 of 29 slices shown, 12 images]
[im 3/29  brain]
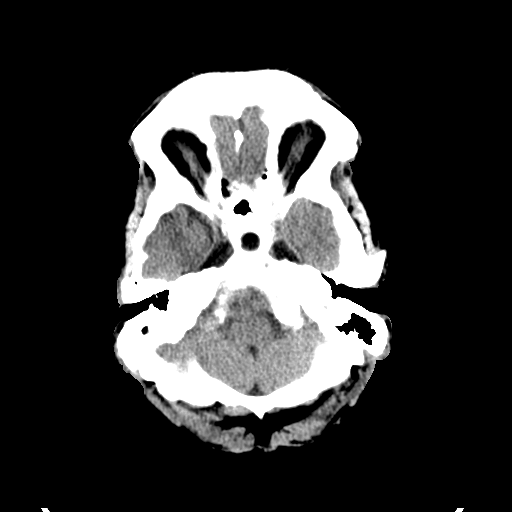
[im 3/29  bone]
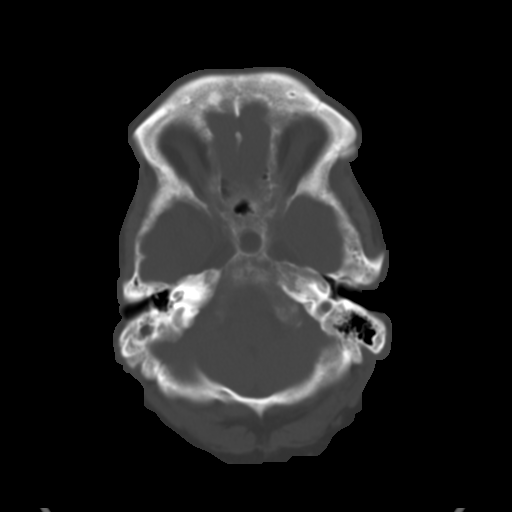
[im 6/29  brain]
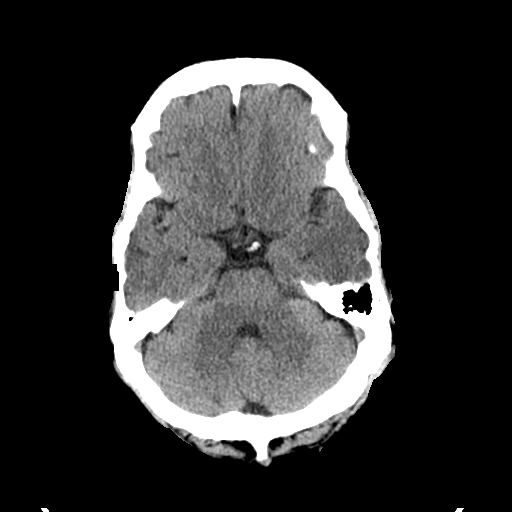
[im 9/29  brain]
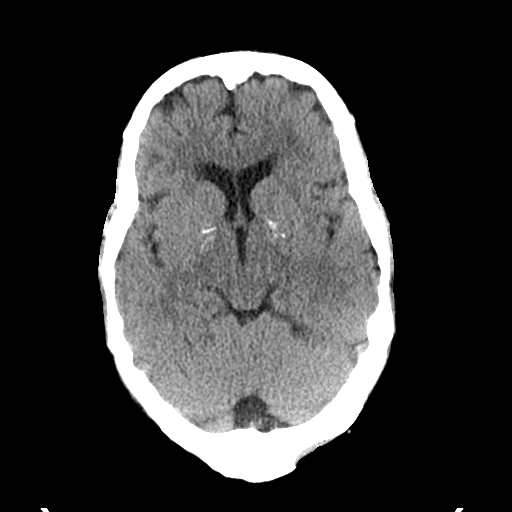
[im 12/29  brain]
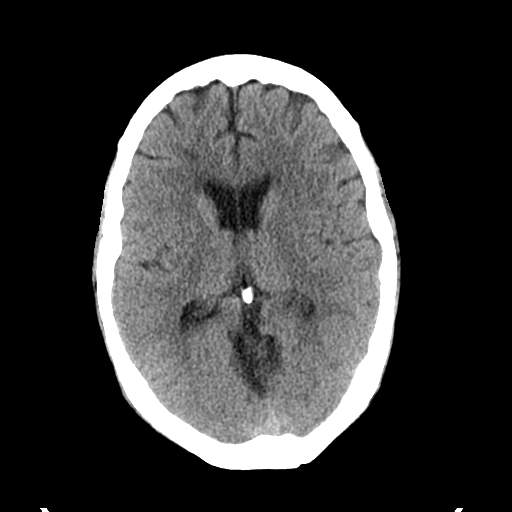
[im 15/29  brain]
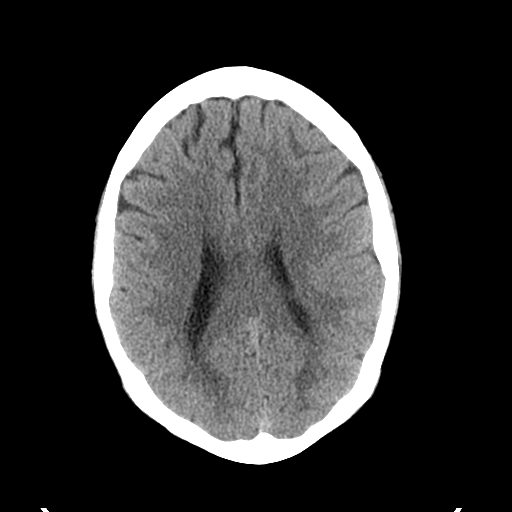
[im 15/29  bone]
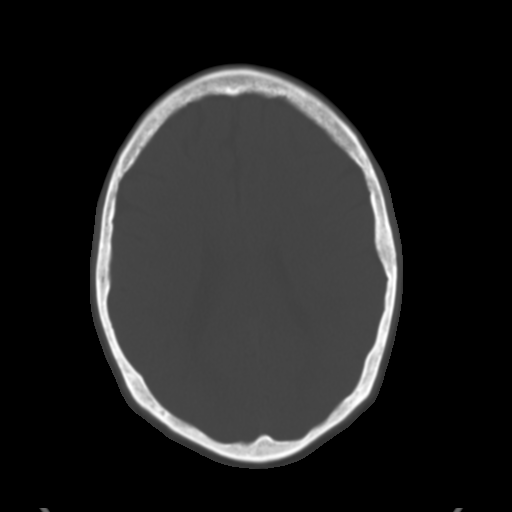
[im 18/29  brain]
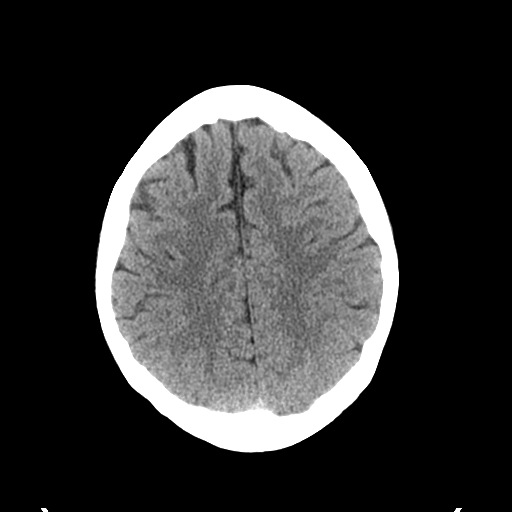
[im 21/29  brain]
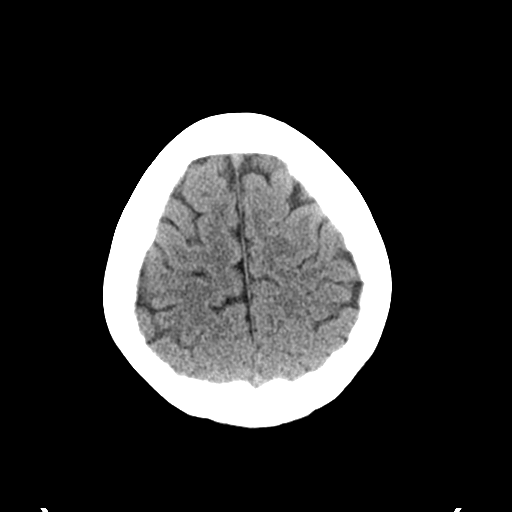
[im 24/29  brain]
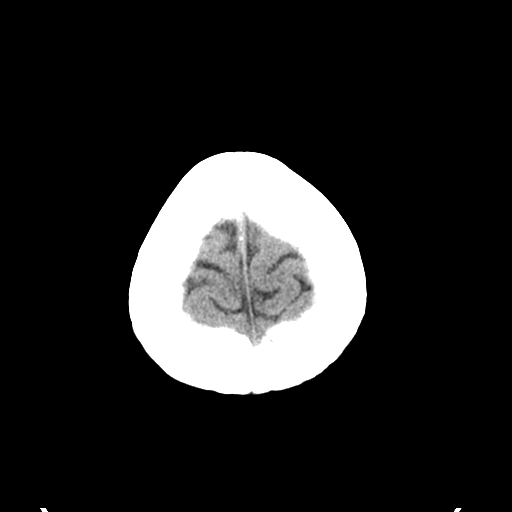
[im 27/29  brain]
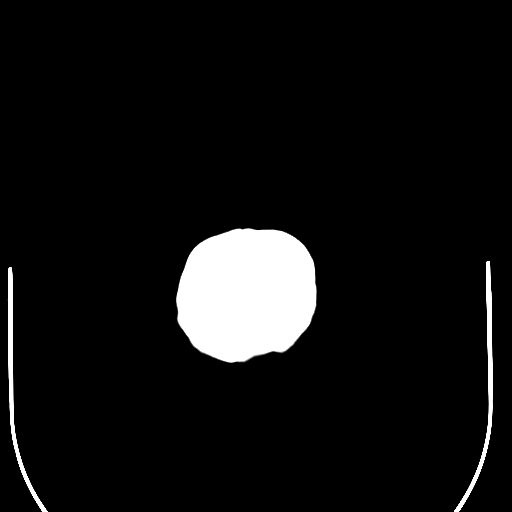
[im 27/29  bone]
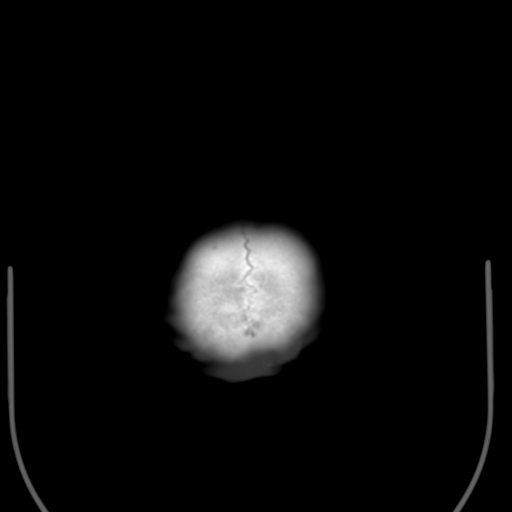

[Series 4: coronal soft tissue · coronal · 0.28mm/px · 3 of 68 slices shown]
[im 23/68  brain]
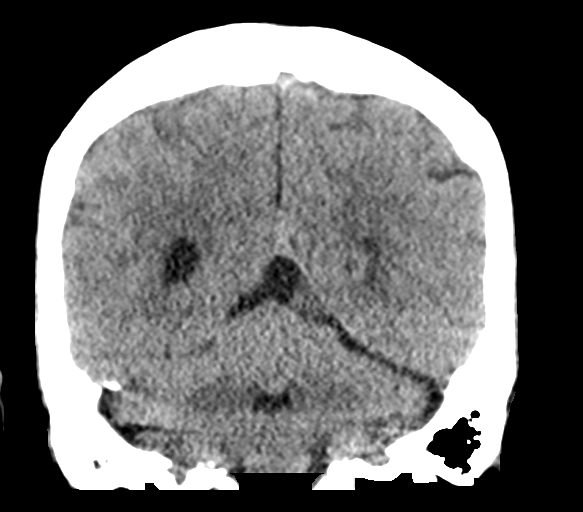
[im 30/68  brain]
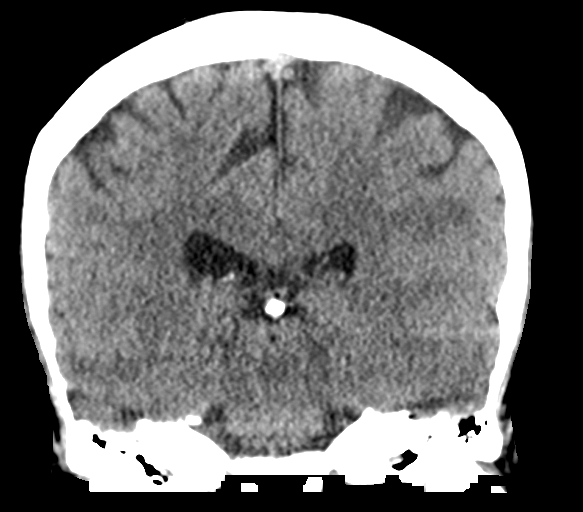
[im 38/68  brain]
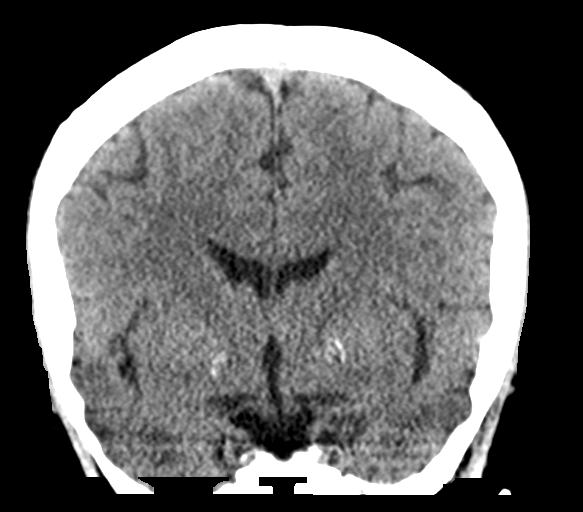

[Series 5: sagittal soft tissue · sagittal · 0.30mm/px · 3 of 50 slices shown]
[im 17/50  brain]
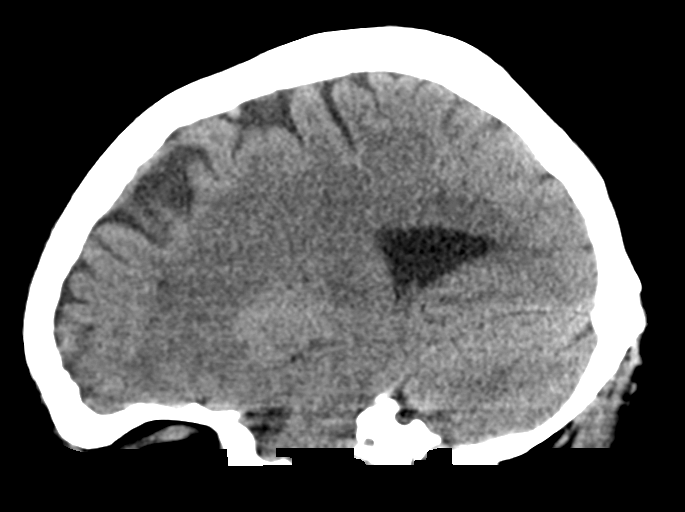
[im 25/50  brain]
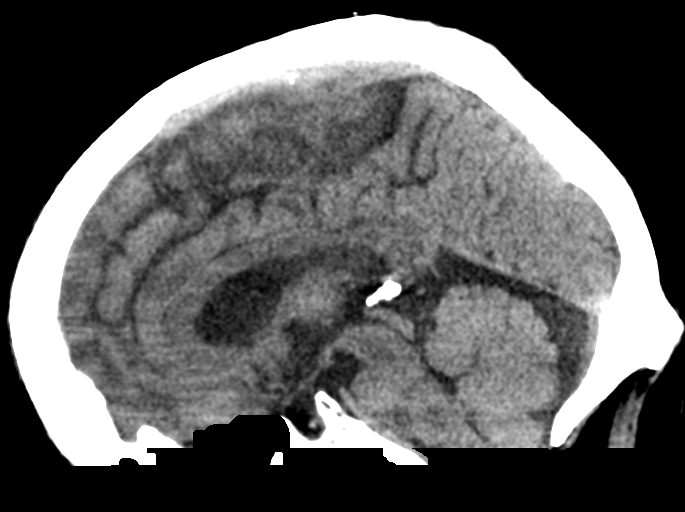
[im 33/50  brain]
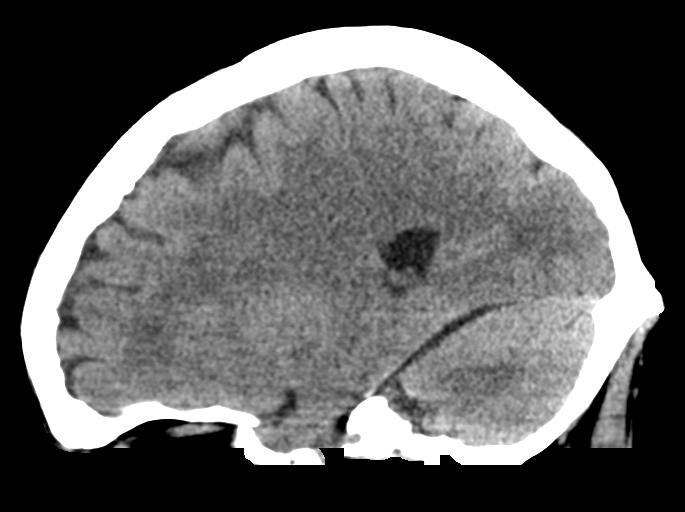

[15 of 46 positions shown; findings below may reference images not displayed]

FINDINGS: Brain: There is no evidence for acute hemorrhage, hydrocephalus,
mass lesion, or abnormal extra-axial fluid collection. No definite
CT evidence for acute infarction. Diffuse loss of parenchymal volume
is consistent with atrophy. Patchy low attenuation in the deep
hemispheric and periventricular white matter is nonspecific, but
likely reflects chronic microvascular ischemic demyelination.

Vascular: No hyperdense vessel or unexpected calcification.

Skull: More prominent sclerotic change seen diffusely in the skull
likely related to underlying systemic disease.

Sinuses/Orbits: Fluid is visible in some of the right mastoid air
cells. Visualized portions of the globes and intraorbital fat are
unremarkable.

Other: None.
IMPRESSION: 1. No acute intracranial abnormality.
2. Atrophy with chronic small vessel white matter ischemic disease.
3. Fluid in the right mastoid air cells compatible with mastoid
effusion.

## 2017-05-30 ENCOUNTER — Other Ambulatory Visit: Payer: Self-pay | Admitting: *Deleted

## 2017-05-30 NOTE — Progress Notes (Signed)
MELD 10. Creatinine improved to 1.13. Keep low dose diuretics as they are, no change. Transaminases improved.

## 2017-05-31 ENCOUNTER — Ambulatory Visit (INDEPENDENT_AMBULATORY_CARE_PROVIDER_SITE_OTHER): Payer: Medicare Other | Admitting: *Deleted

## 2017-05-31 ENCOUNTER — Telehealth: Payer: Self-pay | Admitting: Cardiology

## 2017-05-31 DIAGNOSIS — I495 Sick sinus syndrome: Secondary | ICD-10-CM

## 2017-05-31 NOTE — Telephone Encounter (Signed)
Confirmed remote transmission w/ pt wife.   

## 2017-06-01 ENCOUNTER — Encounter: Payer: Self-pay | Admitting: Cardiology

## 2017-06-01 LAB — CUP PACEART REMOTE DEVICE CHECK
Battery Impedance: 159 Ohm
Battery Remaining Longevity: 135 mo
Brady Statistic AP VS Percent: 0 %
Date Time Interrogation Session: 20180911191218
Implantable Lead Implant Date: 20050923
Implantable Lead Location: 753860
Implantable Lead Model: 4524
Lead Channel Impedance Value: 267 Ohm
Lead Channel Pacing Threshold Amplitude: 0.5 V
Lead Channel Pacing Threshold Pulse Width: 0.4 ms
Lead Channel Setting Pacing Amplitude: 2.5 V
Lead Channel Setting Sensing Sensitivity: 2.8 mV
MDC IDC LEAD IMPLANT DT: 20050923
MDC IDC LEAD LOCATION: 753859
MDC IDC MSMT BATTERY VOLTAGE: 2.79 V
MDC IDC MSMT LEADCHNL RV IMPEDANCE VALUE: 366 Ohm
MDC IDC PG IMPLANT DT: 20150115
MDC IDC SET LEADCHNL RV PACING AMPLITUDE: 2 V
MDC IDC SET LEADCHNL RV PACING PULSEWIDTH: 0.52 ms
MDC IDC STAT BRADY AP VP PERCENT: 0 %
MDC IDC STAT BRADY AS VP PERCENT: 0 %
MDC IDC STAT BRADY AS VS PERCENT: 100 %

## 2017-06-01 NOTE — Progress Notes (Signed)
Remote pacemaker transmission.   

## 2017-06-09 ENCOUNTER — Ambulatory Visit (INDEPENDENT_AMBULATORY_CARE_PROVIDER_SITE_OTHER): Payer: Medicare Other | Admitting: General Surgery

## 2017-06-09 ENCOUNTER — Encounter: Payer: Self-pay | Admitting: General Surgery

## 2017-06-09 VITALS — BP 100/72 | HR 90 | Temp 97.6°F | Resp 18 | Ht 63.0 in | Wt 149.0 lb

## 2017-06-09 DIAGNOSIS — K429 Umbilical hernia without obstruction or gangrene: Secondary | ICD-10-CM

## 2017-06-09 NOTE — Progress Notes (Signed)
[name]; [id number]; [birth year]   HPI Patient is a 56 year old white male who was referred to my care by Dr. Gala Romney for evaluation and treatment of an umbilical hernia. Is been present for some time now. He is currently being treated for cirrhosis. He has had a TIPS procedure in the past. He was admitted in July of this year at [facility] for encephalopathy. He has undergone paracentesis in the past, but has not had it done recently. His umbilical hernia is not bothering him. It does state constantly out. He does pick at it on occasion. He is currently not having significant pain with it. No nausea or vomiting have been noted. He has never had incarceration of the hernia. Past Medical History:  Diagnosis Date  . Alcohol use   . Anemia   . Aortic stenosis    mild  . Arthritis   . Atrial flutter ([place])   . Benign hypertrophy of prostate    with urinary retention; repaired hypospadia; transurethral resection of the prostate   . Cardiac conduction disorder    status post pacemaker implantation in 1995 generator replaced 2005  . Chest pain 2007, 2009   cardiac cath > normal coronary arterie; nl left ventricular function  . CHF (congestive heart failure) ([place])   . Cirrhosis ([place]) 11/2016  . Congenital deafness   . COPD (chronic obstructive pulmonary disease) ([place])   . Diabetes mellitus    +Insulin  . Dyspnea   . Edema   . GERD (gastroesophageal reflux disease)    status post multiple dilatations for stricture  . 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 Hepatitis    Thinks it was B  . History of kidney stones   . HTN (hypertension)   . Hyperlipidemia    statin discontinued due to abn LFTs  . Hypothyroidism   . Mitral regurgitation    insignificant  . Obesity   . Obesity 4/08   BMI= 40  . Pelvic mass    identified in 2009, stable 2010  . PONV (postoperative nausea and vomiting)   . Presence of permanent  cardiac pacemaker   . Sleep apnea    BiPAP and continuous oxygen  . Syncope   . Thyroid disease     Past Surgical History:  Procedure Laterality Date  . A-V CARDIAC PACEMAKER INSERTION  1995  . COLONOSCOPY  2008  . COLONOSCOPY N/A 06/23/2015   NLG:XQJJHER diverticulosis  . ESOPHAGOGASTRODUODENOSCOPY N/A 06/23/2015   DEY:CXKG portal gastropathy  . IR GENERIC HISTORICAL  12/14/2016   IR PARACENTESIS 12/14/2016 [facility] Mariscal, MD [facility]  . IR GENERIC HISTORICAL  12/14/2016   IR TIPS 12/14/2016 Sandi Mariscal, MD [facility]  . IR PARACENTESIS  01/12/2017  . IR RADIOLOGIST EVAL & MGMT  01/06/2017  . IR RADIOLOGIST EVAL & MGMT  02/08/2017  . IR RADIOLOGIST EVAL & MGMT  11/30/2016  . IR TIPS  01/12/2017  . NASAL SINUS SURGERY     partially removed  . ORCHIECTOMY  1990   bilateral; ?neoplasm  . PACEMAKER INSERTION  05/2004; 10/04/2013   MDT dual chamber pacemaker; gen change 10/04/2013 (MDT ADDRL1)  . PERMANENT PACEMAKER GENERATOR CHANGE N/A 10/04/2013   Procedure: PERMANENT PACEMAKER GENERATOR CHANGE;  Surgeon: Evans Lance, MD;  Location: [facility] CATH LAB;  Service: Cardiovascular;  Laterality: N/A;  . RADIOLOGY WITH ANESTHESIA N/A 12/14/2016   Procedure: TIPS;  Surgeon: Sandi Mariscal, MD;  Location: [facility]  OR;  Service: Radiology;  Laterality: N/A;  . RADIOLOGY WITH ANESTHESIA N/A 01/12/2017   Procedure: TIPS;  Surgeon: Sandi Mariscal, MD;  Location: [place];  Service: Radiology;  Laterality: N/A;  . REPAIR HYPOSPADIAS W/ URETHROPLASTY    . TONSILLECTOMY AND ADENOIDECTOMY    . TRANSURETHRAL RESECTION OF PROSTATE     for BPH    Family History  Problem Relation Age of Onset  . COPD Mother   . Hypertension Mother   . Congestive Heart Failure Mother   . Pneumonia Father   . Heart disease Other   . Hypertension Other   . Colon cancer Neg Hx     Current Outpatient Prescriptions on File Prior to Visit  Medication Sig Dispense Refill  . acetaminophen (TYLENOL) 325 MG tablet Take 650 mg by mouth every 6  (six) hours as needed for mild pain.    Marland Kitchen albuterol (PROVENTIL HFA;VENTOLIN HFA) 108 (90 Base) MCG/ACT inhaler Inhale 2 puffs into the lungs every 6 (six) hours as needed for wheezing or shortness of breath.    Marland Kitchen aspirin 325 MG EC tablet Take 325 mg by mouth daily.    . cetirizine (ZYRTEC) 10 MG tablet Take 10 mg by mouth daily.    . feeding supplement, ENSURE ENLIVE, (ENSURE ENLIVE) LIQD Take 237 mLs by mouth 3 (three) times daily between meals. 237 mL 12  . furosemide (LASIX) 20 MG tablet Take 1 tablet (20 mg total) by mouth daily. 90 tablet 3  . gentamicin ointment (GARAMYCIN) 0.1 % Apply 1 application topically at bedtime. Inside nose     . Glucosamine-Chondroit-Vit C-Mn (GLUCOSAMINE CHONDR 1500 COMPLX PO) Take 1 tablet by mouth every morning.    Marland Kitchen HUMALOG MIX 75/25 KWIKPEN (75-25) 100 UNIT/ML Kwikpen Inject 8 Units into the skin 2 (two) times daily. 15 units in the morning and 15 units in the evening (Patient taking differently: Inject 0-8 Units into the skin 2 (two) times daily. Takes 8 units every morning. Takes a dose in the evening using a sliding scale. 4units in evening) 10 mL 0  . lactulose (CHRONULAC) 10 GM/15ML solution 10cc (2 teaspoons) three times daily; Titrate to 2-3 semi formed stools a day 1892 mL 5  . levothyroxine (LEVOXYL) 50 MCG tablet Take 50 mcg by mouth daily before breakfast.     . metoprolol succinate (TOPROL-XL) 25 MG 24 hr tablet Take 25 mg by mouth daily.    . modafinil (PROVIGIL) 200 MG tablet Take 200 mg by mouth daily.     . mometasone (NASONEX) 50 MCG/ACT nasal spray Place 2 sprays into the nose daily as needed (FOR CONGESTION/ALLERGIES).    . Multiple Vitamins-Minerals (CENTRUM) tablet Take 1 tablet by mouth daily.      . nitroGLYCERIN (NITROSTAT) 0.4 MG SL tablet Place 1 tablet (0.4 mg total) under the tongue every 5 (five) minutes as needed for chest pain. 25 tablet 3  . NON FORMULARY Bipap 16.0/e    . Omega-3 Fatty Acids (FISH OIL) 1000 MG CAPS Take 1,000 mg  by mouth every other day.     Marland Kitchen omeprazole (PRILOSEC) 40 MG capsule TAKE 1 CAPSULE BY MOUTH ONCE DAILY FOR REFLUX. 30 capsule 1  . rifaximin (XIFAXAN) 550 MG TABS tablet Take 1 tablet (550 mg total) by mouth 2 (two) times daily. 180 tablet 3  . spironolactone (ALDACTONE) 50 MG tablet Take 1 tablet (50 mg total) by mouth daily. 90 tablet 3  . ursodiol (ACTIGALL) 500 MG tablet TAKE 1 TABLET BY MOUTH  TWICE DAILY 60 tablet 11   No current facility-administered medications on file prior to visit.     Allergies  Allergen Reactions  . Penicillins Swelling    SWELLING OF AIRWAY  PATIENT HAD A PCN REACTION WITH IMMEDIATE RASH, FACIAL/TONGUE/THROAT SWELLING, SOB, OR LIGHTHEADEDNESS WITH HYPOTENSION:  #  #  #  YES  #  #  #  Has patient had a PCN reaction causing severe rash involving mucus membranes or skin necrosis: No Has patient had a PCN reaction that required hospitalization No Has patient had a PCN reaction occurring within the last 10 years: No  . Enalapril Cough  . Metformin And Related Diarrhea  . Eliquis [Apixaban] Other (See Comments)    DOSE RELATED BLEEDING    History  Alcohol Use No    Comment: Hx. of alcohol use    History  Smoking Status  . Never Smoker  Smokeless Tobacco  . Never Used    Comment: tobacco use- no     Review of Systems  Constitutional: Negative.   HENT: Positive for hearing loss.   Eyes: Positive for blurred vision.  Respiratory: Positive for wheezing.   Cardiovascular: Negative.   Gastrointestinal: Negative.   Genitourinary: Negative.   Musculoskeletal: Negative.   Skin: Negative.   Neurological: Negative.   Endo/Heme/Allergies: Negative.   Psychiatric/Behavioral: Negative.     Objective   Vitals:   06/09/17 0930  BP: 100/72  Pulse: 90  Resp: 18  Temp: 97.6 F (36.4 C)    Physical Exam  Constitutional: He is oriented to person, place, and time and well-developed, well-nourished, and in no distress.  HENT:  Head: Normocephalic and  atraumatic.  Cardiovascular: Normal rate and regular rhythm.  Exam reveals no gallop and no friction rub.   Murmur heard. Pacemaker in place. 3/6 systolic ejection murmur heard along the parasternal border.  Pulmonary/Chest: Effort normal and breath sounds normal. No respiratory distress. He has no wheezes. He has no rales.  Abdominal: Soft. Bowel sounds are normal. He exhibits no distension. There is no tenderness. There is no rebound.  Easily reducible umbilical hernia with thin skin present. No evidence of infection noted.  Neurological: He is alert and oriented to person, place, and time.  Skin: Skin is warm and dry.  Vitals reviewed.  GI notes reviewed. Assessment  Umbilical hernia in the face of cirrhosis. Also has multiple cardiopulmonary issues. Plan   Patient is not a candidate for umbilical herniorrhaphy. Patient understands this. The risks outweigh the benefits. This was explained to the family, who understand and agree. Follow up here as needed.

## 2017-06-10 ENCOUNTER — Other Ambulatory Visit: Payer: Self-pay | Admitting: Cardiology

## 2017-06-15 ENCOUNTER — Telehealth: Payer: Self-pay

## 2017-06-15 NOTE — Telephone Encounter (Signed)
[facility] copy of labs forwarded to [name] that was ordered by Dr. Shade Flood.  Placed on desk for Roseanne Kaufman to just [place].

## 2017-06-21 NOTE — Telephone Encounter (Signed)
Most recent labs from 06/14/17 outside:  Cr 1.39, slight bump from before. BUN 37.   Did PCP change any diuretic dosing?

## 2017-06-22 NOTE — Telephone Encounter (Signed)
Spoke with Pam, they have not changed any of the pts diuretics.

## 2017-06-27 ENCOUNTER — Encounter (HOSPITAL_COMMUNITY): Payer: Self-pay

## 2017-06-27 ENCOUNTER — Emergency Department (HOSPITAL_COMMUNITY)
Admission: EM | Admit: 2017-06-27 | Discharge: 2017-06-27 | Disposition: A | Discharge status: Home / Self Care | Payer: Medicare Other | Attending: Emergency Medicine | Admitting: Emergency Medicine

## 2017-06-27 DIAGNOSIS — I5032 Chronic diastolic (congestive) heart failure: Secondary | ICD-10-CM | POA: Insufficient documentation

## 2017-06-27 DIAGNOSIS — I110 Hypertensive heart disease with heart failure: Secondary | ICD-10-CM | POA: Diagnosis not present

## 2017-06-27 DIAGNOSIS — Z79899 Other long term (current) drug therapy: Secondary | ICD-10-CM | POA: Insufficient documentation

## 2017-06-27 DIAGNOSIS — Z794 Long term (current) use of insulin: Secondary | ICD-10-CM | POA: Diagnosis not present

## 2017-06-27 DIAGNOSIS — Z950 Presence of cardiac pacemaker: Secondary | ICD-10-CM | POA: Diagnosis not present

## 2017-06-27 DIAGNOSIS — Y999 Unspecified external cause status: Secondary | ICD-10-CM | POA: Insufficient documentation

## 2017-06-27 DIAGNOSIS — E119 Type 2 diabetes mellitus without complications: Secondary | ICD-10-CM | POA: Diagnosis not present

## 2017-06-27 DIAGNOSIS — Y9389 Activity, other specified: Secondary | ICD-10-CM | POA: Insufficient documentation

## 2017-06-27 DIAGNOSIS — W268XXA Contact with other sharp object(s), not elsewhere classified, initial encounter: Secondary | ICD-10-CM | POA: Diagnosis not present

## 2017-06-27 DIAGNOSIS — Y929 Unspecified place or not applicable: Secondary | ICD-10-CM | POA: Diagnosis not present

## 2017-06-27 DIAGNOSIS — S41111A Laceration without foreign body of right upper arm, initial encounter: Secondary | ICD-10-CM | POA: Diagnosis not present

## 2017-06-27 DIAGNOSIS — S41112A Laceration without foreign body of left upper arm, initial encounter: Secondary | ICD-10-CM | POA: Diagnosis not present

## 2017-06-27 MED ORDER — LIDOCAINE HCL (PF) 1 % IJ SOLN
INTRAMUSCULAR | Status: AC
  Administered 2017-06-27: 10 mL via INTRADERMAL
  Filled 2017-06-27: qty 10

## 2017-06-27 MED ORDER — LIDOCAINE-EPINEPHRINE (PF) 2 %-1:200000 IJ SOLN: 10.0000 mL | Freq: Once | INTRAMUSCULAR | Status: DC

## 2017-06-27 MED ORDER — LIDOCAINE HCL (PF) 1 % IJ SOLN
10.0000 mL | Freq: Once | INTRAMUSCULAR | Status: AC
Start: 2017-06-27 — End: 2017-06-27
  Administered 2017-06-27: 10 mL via INTRADERMAL

## 2017-06-27 MED ORDER — POVIDONE-IODINE 10 % EX SOLN
CUTANEOUS | Status: AC
  Administered 2017-06-27: 15:00:00
  Filled 2017-06-27: qty 15

## 2017-06-27 NOTE — Telephone Encounter (Signed)
Noted. Let's repeat BMP sometime this week.

## 2017-06-27 NOTE — ED Triage Notes (Signed)
Reports of lacerations to bilateral lower arms by lawn mower blade pta. Patient was seen by EMS and told to come to ED for suture repair. Dressing applied to arm. Bleeding controlled.

## 2017-06-27 NOTE — Discharge Instructions (Signed)
Please keep the wounds clean and dry.Please allow the surgical glue to come off on its own. Please have the staples removed in 7 or 8 days. Keep wounds clean and dry. See Dr Shade Flood if any signs of infection.

## 2017-06-27 NOTE — ED Provider Notes (Signed)
[facility] DEPT Provider Note   CSN: [id number] Arrival date & time: 06/27/17  1219     History   Chief Complaint Chief Complaint  Patient presents with  . Laceration    HPI [name] is a 56 y.o. male.  Patient is a 56 year old male who presents to the emergency department with a laceration to the right and left arm.  Patient has a history of degenerative joint disease, atrial flutter, cirrhosis, chronic obstructive pulmonary disease, and diabetes mellitus.  The patient and his significant other state that he was working on a lawnmower when he sustained a laceration to both arms. He was able to control the bleeding by applying pressure.He states that he has good use of his wrist and fingers on both upper extremities. His last tetanus shot was approximately 6 months ago.       Past Medical History:  Diagnosis Date  . Alcohol use   . Anemia   . Aortic stenosis    mild  . Arthritis   . Atrial flutter ([place])   . Benign hypertrophy of prostate    with urinary retention; repaired hypospadia; transurethral resection of the prostate   . Cardiac conduction disorder    status post pacemaker implantation in 1995 generator replaced 2005  . Chest pain 2007, 2009   cardiac cath > normal coronary arterie; nl left ventricular function  . CHF (congestive heart failure) ([place])   . Cirrhosis ([place]) 11/2016  . Congenital deafness   . COPD (chronic obstructive pulmonary disease) ([place])   . Diabetes mellitus    +Insulin  . Dyspnea   . Edema   . GERD (gastroesophageal reflux disease)    status post multiple dilatations for stricture  . 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 Hepatitis    Thinks it was B  . History of kidney stones   . HTN (hypertension)   . Hyperlipidemia    statin discontinued due to abn LFTs  . Hypothyroidism   . Mitral regurgitation    insignificant  . Obesity   . Obesity 4/08   BMI= 40    . Pelvic mass    identified in 2009, stable 2010  . PONV (postoperative nausea and vomiting)   . Presence of permanent cardiac pacemaker   . Sleep apnea    BiPAP and continuous oxygen  . Syncope   . Thyroid disease     Patient Active Problem List   Diagnosis Date Noted  . S/P TIPS (transjugular intrahepatic portosystemic shunt)   . Transaminitis   . Hepatic encephalopathy ([place]) 04/19/2017  . Decompensated liver disease ([place]) 04/19/2017  . History of head injury 01/27/2017  . Malnutrition of moderate degree 12/24/2016  . Chronic respiratory failure with hypoxia ([place]) 12/24/2016  . COPD (chronic obstructive pulmonary disease) ([place]) 12/23/2016  . 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 Anemia 11/11/2016  . AKI (acute kidney injury) ([place]) 11/11/2016  . Hyperkalemia 11/11/2016  . Hypothyroidism 07/15/2016  . Elevated alkaline phosphatase level 06/14/2016  . Elevated LFTs 06/14/2016  . Ascites   . Diarrhea 02/29/2016  . Abnormal liver function 02/29/2016  . Atrial flutter ([place]) 02/29/2016  . Hypoglycemia 02/29/2016  . Abdominal pain 02/29/2016  . Jejunitis   . Adenomatous colon polyp 09/01/2015  . Portal hypertensive gastropathy ([place])   . Heme + stool 06/02/2015  . Hematuria 10/26/2013  . HTN (hypertension)   . Atrial fibrillation ([place]) 10/19/2013  .  Chronic diastolic heart failure ([place]) 09/19/2013  . Chest pain   . Aortic stenosis   . Hypertension   . Hyperlipidemia   . Pelvic mass   . Cirrhosis of liver with ascites ([place])   . Benign hypertrophy of prostate   . Sleep apnea   . GASTROESOPHAGEAL REFLUX DISEASE 05/19/2010  . Presence of permanent cardiac pacemaker 03/18/2010  . Type 2 diabetes mellitus ([place]) 06/27/2008  . ANEMIA, MILD 06/27/2008  . Deafness congenital 06/27/2008  . CONDUCTION DISORDER OF THE HEART 06/27/2008    Past Surgical History:  Procedure Laterality Date  . A-V CARDIAC PACEMAKER INSERTION  1995  . COLONOSCOPY  2008  .  COLONOSCOPY N/A 06/23/2015   NWG:NFAOZHY diverticulosis  . ESOPHAGOGASTRODUODENOSCOPY N/A 06/23/2015   QMV:HQIO portal gastropathy  . IR GENERIC HISTORICAL  12/14/2016   IR PARACENTESIS 12/14/2016 Sandi Mariscal, MD [facility]INTER[facility]  . IR GENERIC HISTORICAL  12/14/2016   IR TIPS 12/14/2016 Sandi Mariscal, MD [facility]  . IR PARACENTESIS  01/12/2017  . IR RADIOLOGIST EVAL & MGMT  01/06/2017  . IR RADIOLOGIST EVAL & MGMT  02/08/2017  . IR RADIOLOGIST EVAL & MGMT  11/30/2016  . IR TIPS  01/12/2017  . NASAL SINUS SURGERY     partially removed  . ORCHIECTOMY  1990   bilateral; ?neoplasm  . PACEMAKER INSERTION  05/2004; 10/04/2013   MDT dual chamber pacemaker; gen change 10/04/2013 (MDT ADDRL1)  . PERMANENT PACEMAKER GENERATOR CHANGE N/A 10/04/2013   Procedure: PERMANENT PACEMAKER GENERATOR CHANGE;  Surgeon: Evans Lance, MD;  Location[facility] C[facility] LAB;  Service: Cardiovascular;  Laterality: N/A;  . RADIOLOGY WITH ANESTHESIA N/A 12/14/2016   Procedure: TIPS;  Surgeon: Sandi Mariscal, MD;  Location: [facility];  Service: Radiology;  Laterality: N/A;  . RADIOLOGY WITH ANESTHESIA N/A 01/12/2017   Procedure: TIPS;  Surgeon: Sandi Mariscal, MD;  Location: [facility];  Service: Radiology;  Laterality: N/A;  . REPAIR HYPOSPADIAS W/ URETHROPLASTY    . TONSILLECTOMY AND ADENOIDECTOMY    . TRANSURETHRAL RESECTION OF PROSTATE     for BPH       Home Medications    Prior to Admission medications   Medication Sig Start Date End Date Taking? Authorizing Provider  acetaminophen (TYLENOL) 325 MG tablet Take 650 mg by mouth every 6 (six) hours as needed for mild pain.    [provider]  albuterol (PROVENTIL HFA;VENTOLIN HFA) 108 (90 Base) MCG/ACT inhaler Inhale 2 puffs into the lungs every 6 (six) hours as needed for wheezing or shortness of breath.    [provider]  aspirin 325 MG EC tablet Take 325 mg by mouth daily.    [provider]  cetirizine (ZYRTEC) 10 MG tablet Take 10 mg by mouth daily.    [provider]  feeding supplement, ENSURE ENLIVE, (ENSURE ENLIVE) LIQD Take 237 mLs by mouth 3 (three) times daily between meals. 04/19/17   Dhungel, Nishant, MD  furosemide (LASIX) 20 MG tablet Take 1 tablet (20 mg total) by mouth daily. 03/22/17   Annitta Needs, NP  gentamicin ointment (GARAMYCIN) 0.1 % Apply 1 application topically at bedtime. Inside nose     [provider]  Glucosamine-Chondroit-Vit C-Mn (GLUCOSAMINE CHONDR 1500 COMPLX PO) Take 1 tablet by mouth every morning.    [provider]  HUMALOG MIX 75/25 KWIKPEN (75-25) 100 UNIT/ML Kwikpen Inject 8 Units into the skin 2 (two) times daily. 15 units in the morning and 15 units in the evening Patient taking differently:  Inject 0-8 Units into the skin 2 (two) times daily. Takes 8 units every morning. Takes a dose in the evening using a sliding scale. 4units in evening 11/14/16   Eber Jones, MD  lactulose [facility]) 10 GM/15ML solution 10cc (2 teaspoons) three times daily; Titrate to 2-3 semi formed stools a day 05/25/17   Annitta Needs, NP  levothyroxine (LEVOXYL) 50 MCG tablet Take 50 mcg by mouth daily before breakfast.     [provider]  metoprolol succinate (TOPROL-XL) 25 MG 24 hr tablet Take 25 mg by mouth daily.    [provider]  modafinil (PROVIGIL) 200 MG tablet Take 200 mg by mouth daily.  01/13/15   [provider]  mometasone (NASONEX) 50 MCG/ACT nasal spray Place 2 sprays into the nose daily as needed (FOR CONGESTION/ALLERGIES).    [provider]  Multiple Vitamins-Minerals (CENTRUM) tablet Take 1 tablet by mouth daily.      [provider]  nitroGLYCERIN (NITROSTAT) 0.4 MG SL tablet Place 1 tablet (0.4 mg total) under the tongue every 5 (five) minutes as needed for chest pain. 06/26/15   Lendon Colonel, NP  NON FORMULARY Bipap 16.0/e    [provider]  Omega-3 Fatty Acids (FISH OIL) 1000 MG CAPS Take 1,000 mg by mouth every other day.      [provider]  omeprazole (PRILOSEC) 40 MG capsule TAKE 1 CAPSULE BY MOUTH ONCE DAILY FOR REFLUX. 06/10/17   Arnoldo Lenis, MD  rifaximin (XIFAXAN) 550 MG TABS tablet Take 1 tablet (550 mg total) by mouth 2 (two) times daily. 05/25/17   Annitta Needs, NP  spironolactone (ALDACTONE) 50 MG tablet Take 1 tablet (50 mg total) by mouth daily. 03/22/17   Annitta Needs, NP  ursodiol (ACTIGALL) 500 MG tablet TAKE 1 TABLET BY MOUTH TWICE DAILY 08/11/16   Carlis Stable, NP    Family History Family History  Problem Relation Age of Onset  . COPD Mother   . Hypertension Mother   . Congestive Heart Failure Mother   . Pneumonia Father   . Heart disease Other   . Hypertension Other   . Colon cancer Neg Hx     Social History Social History  Substance Use Topics  . Smoking status: Never Smoker  . Smokeless tobacco: Never Used     Comment: tobacco use- no   . Alcohol use No     Comment: Hx. of alcohol use     Allergies   Penicillins; Enalapril; Metformin and related; and Eliquis [apixaban]   Review of Systems Review of Systems  Constitutional: Negative for activity change.       All ROS Neg except as noted in HPI  HENT: Negative for nosebleeds.   Eyes: Negative for photophobia and discharge.  Respiratory: Negative for cough, shortness of breath and wheezing.   Cardiovascular: Negative for chest pain and palpitations.  Gastrointestinal: Negative for abdominal pain and blood in stool.  Genitourinary: Negative for dysuria, frequency and hematuria.  Musculoskeletal: Negative for arthralgias, back pain and neck pain.  Skin: Negative.   Neurological: Negative for dizziness, seizures and speech difficulty.  Psychiatric/Behavioral: Negative for confusion and hallucinations.     Physical Exam Updated Vital Signs BP 104/73 (BP Location: Right Arm)   Pulse 92   Temp 97.7 F (36.5 C) (Oral)   Resp 18   Ht 5\' 3"  (1.6 m)   Wt 67.6 kg (149 lb)   SpO2 98%   BMI 26.39 kg/m  Physical Exam  Constitutional: He is oriented to person, place, and time. He appears well-developed and well-nourished.  Non-toxic appearance.  HENT:  Head: Normocephalic.  Right Ear: Tympanic membrane and external ear normal.  Left Ear: Tympanic membrane and external ear normal.  Eyes: Pupils are equal, round, and reactive to light. EOM and lids are normal.  Neck: Normal range of motion. Neck supple. Carotid bruit is not present.  Cardiovascular: Normal rate, regular rhythm, normal heart sounds, intact distal pulses and normal pulses.   Pulmonary/Chest: Breath sounds normal. No respiratory distress.  Abdominal: Soft. Bowel sounds are normal. There is no tenderness. There is no guarding.  Musculoskeletal: Normal range of motion.  Patient has an L-shaped laceration of the right forearm at the dorsal area. There is no tender bone involvement. Is full range of motion of the fingers.  Patient has a jagged skin tear involving the left forearm. There is full range of motion of the fingers and wrist. Capillary refill is less than 2 seconds. Is no bone or tendon involvement.   Lymphadenopathy:       Head (right side): No submandibular adenopathy present.       Head (left side): No submandibular adenopathy present.    He has no cervical adenopathy.  Neurological: He is alert and oriented to person, place, and time. He has normal strength. No cranial nerve deficit or sensory deficit.  Skin: Skin is warm and dry.  Psychiatric: He has a normal mood and affect. His speech is normal.  Nursing note and vitals reviewed.    ED Treatments / Results  Labs (all labs ordered are listed, but only abnormal results are displayed) Labs Reviewed - No data to display  EKG  EKG Interpretation None       Radiology No results found.  Procedures .Marland KitchenLaceration Repair Date/Time: 06/27/2017 2:51 PM Performed by: Lily Kocher Authorized by: Lily Kocher   Consent:    Consent obtained:  Verbal    Consent given by:  Patient and spouse   Risks discussed:  Infection, pain, poor cosmetic result and poor wound healing Anesthesia (see MAR for exact dosages):    Anesthesia method:  None Laceration details:    Location:  Shoulder/arm   Shoulder/arm location:  L lower arm   Length (cm):  8 Repair type:    Repair type:  Simple Pre-procedure details:    Preparation:  Patient was prepped and draped in usual sterile fashion Exploration:    Hemostasis achieved with:  Direct pressure   Wound exploration: wound explored through full range of motion     Wound extent: no tendon damage noted, no underlying fracture noted and no vascular damage noted   Treatment:    Area cleansed with:  Betadine   Amount of cleaning:  Standard   Irrigation solution:  Sterile saline Skin repair:    Repair method:  Tissue adhesive Approximation:    Approximation:  Close Post-procedure details:    Dressing:  Open (no dressing) .Marland KitchenLaceration Repair Date/Time: 06/27/2017 2:53 PM Performed by: Lily Kocher Authorized by: Lily Kocher   Consent:    Consent obtained:  Verbal   Consent given by:  Patient and spouse   Risks discussed:  Infection, poor cosmetic result and poor wound healing Anesthesia (see MAR for exact dosages):    Anesthesia method:  Local infiltration   Local anesthetic:  Lidocaine 1% w/o epi Laceration details:    Location:  Shoulder/arm   Shoulder/arm location:  R lower arm   Length (cm):  6 Repair type:    Repair type:  Simple Pre-procedure details:    Preparation:  Patient was prepped and draped in usual sterile fashion Exploration:    Hemostasis achieved with:  Direct pressure   Wound exploration: wound explored through full range of motion     Wound extent: no tendon damage noted, no underlying fracture noted and no vascular damage noted   Treatment:    Area cleansed with:  Betadine   Amount of cleaning:  Standard   Irrigation solution:  Sterile saline Skin repair:     Repair method:  Staples   Number of staples:  7 Approximation:    Approximation:  Close Post-procedure details:    Dressing:  Non-adherent dressing   Patient tolerance of procedure:  Tolerated well, no immediate complications   (including critical care time)  Medications Ordered in ED Medications  povidone-iodine (BETADINE) 10 % external solution (not administered)  lidocaine (PF) (XYLOCAINE) 1 % injection (not administered)  lidocaine-EPINEPHrine (XYLOCAINE W/EPI) 2 %-1:200000 (PF) injection 10 mL (not administered)     Initial Impression / Assessment and Plan / ED Course  I have reviewed the triage vital signs and the nursing notes.  Pertinent labs & imaging results that were available during my care of the patient were reviewed by me and considered in my medical decision making (see chart for details).      Final Clinical Impressions(s) / ED Diagnoses MDM Patient injured the right and left arm while working on a lawnmower. Right and left arm lacerations were repaired without problem. Patient is up-to-date on his tetanus. I discussed with the patient and his significant other the importance of returning if signs of infection, or seeing their primary physician. Family member acknowledge understanding of the instructions.   Final diagnoses:  Arm laceration, left, initial encounter  Arm laceration, right, initial encounter    New Prescriptions Discharge Medication List as of 06/27/2017  2:59 PM       Lily Kocher, PA-C 06/28/17 1837    Julianne Rice, MD 07/01/17 [phone]

## 2017-06-30 NOTE — Telephone Encounter (Signed)
Spoke with the pts wife, she said she cannot get him anywhere until next week. She already has lab orders which were mailed out to them as a recall. She will take him to have it done as soon as she can.

## 2017-07-06 ENCOUNTER — Telehealth: Payer: Self-pay | Admitting: *Deleted

## 2017-07-06 NOTE — Telephone Encounter (Signed)
[facility] results received ordered by Dr. Shade Flood. Placed on AB's desk

## 2017-07-12 NOTE — Telephone Encounter (Signed)
Most recent outside labs from Oct 2018:   Tbili 1.8, Alk Phos 606, AST 97, ALT 65. Transaminases continue to improve from hospitalization. Alk Phos remaining similar. To be scanned. Will see Wednesday.

## 2017-07-13 ENCOUNTER — Encounter: Payer: Self-pay | Admitting: Gastroenterology

## 2017-07-13 ENCOUNTER — Telehealth: Payer: Self-pay

## 2017-07-13 ENCOUNTER — Other Ambulatory Visit: Payer: Self-pay

## 2017-07-13 ENCOUNTER — Ambulatory Visit (INDEPENDENT_AMBULATORY_CARE_PROVIDER_SITE_OTHER): Payer: Medicare Other | Admitting: Gastroenterology

## 2017-07-13 VITALS — BP 109/75 | HR 92 | Temp 97.0°F | Ht 63.0 in | Wt 155.6 lb

## 2017-07-13 DIAGNOSIS — K7581 Nonalcoholic steatohepatitis (NASH): Secondary | ICD-10-CM

## 2017-07-13 DIAGNOSIS — K7460 Unspecified cirrhosis of liver: Secondary | ICD-10-CM

## 2017-07-13 NOTE — Telephone Encounter (Signed)
Called and informed pt's wife of pre-op appt 08/01/17 at 12:45pm. Letter mailed.

## 2017-07-13 NOTE — Patient Instructions (Signed)
We have ordered a routine ultrasound of your liver.   I am going to get the most recent blood work from Dr. Shade Flood.   We have also ordered an upper endoscopy to assess your esophagus for any enlarged blood vessels.  I will see you in December 2018!

## 2017-07-13 NOTE — Progress Notes (Signed)
Referring Provider: Petra Kuba, MD Primary Care Physician:  Petra Kuba, MD Primary GI: Dr. Gala Romney   Chief Complaint  Patient presents with  . Follow-up    Cirrhosis    HPI:   [name] is a 56 y.o. male presenting today with a history of NASH cirrhosis, ETOH use, treated empirically for AMA negative PBC starting approximately Nov 2017. Underwent TIPS utilizing a gun-sight technique on 01/12/17. Presented with encephalopathy in July 2018 and admitted. Non-compliant with lactulose therapy at that time. TIPS has remained patent. Low dose lasix 20 mg and aldactone 50 mg since TIPS procedure. No need for para since TIPS. Needs routine [name] in Nov 2018. Needs follow-up with IR again in October. Due for EGD now for surveillance.   Wife present today and states he "had a fight with the lawnmower and the lawnmower won". Has bandages on both arms. Was seen in the ED earlier this month. No overt GI bleeding. No abdominal pain, N/V. No mental status changes or confusion. 3 BMs daily. Taking Lactulose BID. Xifaxan BID. Went through a time when he was not taking medications and wife believes urso was one of them. Lasix 20 mg daily and aldactone 50 mg daily. On aspirin 325 mg daily.   Past Medical History:  Diagnosis Date  . Alcohol use   . Anemia   . Aortic stenosis    mild  . Arthritis   . Atrial flutter ([place])   . Benign hypertrophy of prostate    with urinary retention; repaired hypospadia; transurethral resection of the prostate   . Cardiac conduction disorder    status post pacemaker implantation in 1995 generator replaced 2005  . Chest pain 2007, 2009   cardiac cath > normal coronary arterie; nl left ventricular function  . CHF (congestive heart failure) ([place])   . Cirrhosis ([place]) 11/2016  . Congenital deafness   . COPD (chronic obstructive pulmonary disease) ([place])   . Diabetes mellitus    +Insulin  . Dyspnea   . Edema   . GERD (gastroesophageal reflux disease)      status post multiple dilatations for stricture  . 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 Hepatitis    Thinks it was B  . History of kidney stones   . HTN (hypertension)   . Hyperlipidemia    statin discontinued due to abn LFTs  . Hypothyroidism   . Mitral regurgitation    insignificant  . Obesity   . Obesity 4/08   BMI= 40  . Pelvic mass    identified in 2009, stable 2010  . PONV (postoperative nausea and vomiting)   . Presence of permanent cardiac pacemaker   . Sleep apnea    BiPAP and continuous oxygen  . Syncope   . Thyroid disease     Past Surgical History:  Procedure Laterality Date  . A-V CARDIAC PACEMAKER INSERTION  1995  . COLONOSCOPY  2008  . COLONOSCOPY N/A 06/23/2015   AOZ:HYQMVHQ diverticulosis  . ESOPHAGOGASTRODUODENOSCOPY N/A 06/23/2015   ION:GEXB portal gastropathy  . IR GENERIC HISTORICAL  12/14/2016   IR PARACENTESIS 12/14/2016 Sandi Mariscal, MD [facility]  . IR GENERIC HISTORICAL  12/14/2016   IR TIPS 12/14/2016 Sandi Mariscal, MD [facility]  . IR PARACENTESIS  01/12/2017  . IR RADIOLOGIST EVAL & MGMT  01/06/2017  . IR RADIOLOGIST EVAL & MGMT  02/08/2017  . IR RADIOLOGIST EVAL & MGMT  11/30/2016  . IR TIPS  01/12/2017  . NASAL SINUS SURGERY     partially removed  . ORCHIECTOMY  1990   bilateral; ?neoplasm  . PACEMAKER INSERTION  05/2004; 10/04/2013   MDT dual chamber pacemaker; gen change 10/04/2013 (MDT ADDRL1)  . PERMANENT PACEMAKER GENERATOR CHANGE N/A 10/04/2013   Procedure: PERMANENT PACEMAKER GENERATOR CHANGE;  Surgeon: Evans Lance, MD;  Location: [facility] CATH LAB;  Service: Cardiovascular;  Laterality: N/A;  . RADIOLOGY WITH ANESTHESIA N/A 12/14/2016   Procedure: TIPS;  Surgeon: Sandi Mariscal, MD;  Location: [place];  Service: Radiology;  Laterality: N/A;  . RADIOLOGY WITH ANESTHESIA N/A 01/12/2017   Procedure: TIPS;  Surgeon: Sandi Mariscal, MD;  Location: [place];  Service: Radiology;  Laterality: N/A;   . REPAIR HYPOSPADIAS W/ URETHROPLASTY    . TONSILLECTOMY AND ADENOIDECTOMY    . TRANSURETHRAL RESECTION OF PROSTATE     for BPH    Current Outpatient Prescriptions  Medication Sig Dispense Refill  . acetaminophen (TYLENOL) 325 MG tablet Take 650 mg by mouth every 6 (six) hours as needed for mild pain.    Marland Kitchen albuterol (PROVENTIL HFA;VENTOLIN HFA) 108 (90 Base) MCG/ACT inhaler Inhale 2 puffs into the lungs every 6 (six) hours as needed for wheezing or shortness of breath.    Marland Kitchen albuterol (PROVENTIL) (2.5 MG/3ML) 0.083% nebulizer solution Take 2.5 mg by nebulization every 6 (six) hours as needed for wheezing or shortness of breath.    Marland Kitchen aspirin 325 MG EC tablet Take 325 mg by mouth daily.    . Calcium Carb-Cholecalciferol (CALCIUM 600+D) 600-800 MG-UNIT TABS Take 1 tablet by mouth 2 (two) times daily.    . cetirizine (ZYRTEC) 10 MG tablet Take 10 mg by mouth daily.    . feeding supplement, ENSURE ENLIVE, (ENSURE ENLIVE) LIQD Take 237 mLs by mouth 3 (three) times daily between meals. 237 mL 12  . furosemide (LASIX) 20 MG tablet Take 1 tablet (20 mg total) by mouth daily. 90 tablet 3  . gentamicin ointment (GARAMYCIN) 0.1 % Apply 1 application topically at bedtime. Inside nose     . HUMALOG MIX 75/25 KWIKPEN (75-25) 100 UNIT/ML Kwikpen Inject 8 Units into the skin 2 (two) times daily. 15 units in the morning and 15 units in the evening (Patient taking differently: Inject 0-8 Units into the skin 2 (two) times daily. Takes 8 units every morning. Takes a dose in the evening using a sliding scale. 4units in evening) 10 mL 0  . lactulose (CHRONULAC) 10 GM/15ML solution 10cc (2 teaspoons) three times daily; Titrate to 2-3 semi formed stools a day 1892 mL 5  . levothyroxine (LEVOXYL) 50 MCG tablet Take 50 mcg by mouth daily before breakfast.     . metoprolol succinate (TOPROL-XL) 25 MG 24 hr tablet Take 25 mg by mouth daily.    . modafinil (PROVIGIL) 200 MG tablet Take 200 mg by mouth daily.     .  mometasone (NASONEX) 50 MCG/ACT nasal spray Place 2 sprays into the nose daily as needed (FOR CONGESTION/ALLERGIES).    . Multiple Vitamins-Minerals (CENTRUM) tablet Take 1 tablet by mouth daily.      . nitroGLYCERIN (NITROSTAT) 0.4 MG SL tablet Place 1 tablet (0.4 mg total) under the tongue every 5 (five) minutes as needed for chest pain. 25 tablet 3  . NON FORMULARY Bipap 16.0/e    . omeprazole (PRILOSEC) 40 MG capsule TAKE 1 CAPSULE BY MOUTH ONCE DAILY FOR REFLUX. 30 capsule 0  . rifaximin (XIFAXAN) 550  MG TABS tablet Take 1 tablet (550 mg total) by mouth 2 (two) times daily. 180 tablet 3  . spironolactone (ALDACTONE) 50 MG tablet Take 1 tablet (50 mg total) by mouth daily. 90 tablet 3  . ursodiol (ACTIGALL) 500 MG tablet TAKE 1 TABLET BY MOUTH TWICE DAILY 60 tablet 11  . Glucosamine-Chondroit-Vit C-Mn (GLUCOSAMINE CHONDR 1500 COMPLX PO) Take 1 tablet by mouth every morning.    . Omega-3 Fatty Acids (FISH OIL) 1000 MG CAPS Take 1,000 mg by mouth every other day.      No current facility-administered medications for this visit.     Allergies as of 07/13/2017 - Review Complete 07/13/2017  Allergen Reaction Noted  . Penicillins Swelling   . Enalapril Cough 04/15/2011  . Metformin and related Diarrhea 07/30/2013  . Eliquis [apixaban] Other (See Comments) 03/18/2014    Family History  Problem Relation Age of Onset  . COPD Mother   . Hypertension Mother   . Congestive Heart Failure Mother   . Pneumonia Father   . Heart disease Other   . Hypertension Other   . Colon cancer Neg Hx     Social History   Social History  . Marital status: Married    Spouse name: N/A  . Number of children: N/A  . Years of education: N/A   Social History Main Topics  . Smoking status: Never Smoker  . Smokeless tobacco: Never Used     Comment: tobacco use- no   . Alcohol use No     Comment: Hx. of alcohol use  . Drug use: No  . Sexual activity: Yes   Other Topics Concern  . None   Social  History Narrative   Part time.     Review of Systems: Gen: Denies fever, chills, anorexia. Denies fatigue, weakness, weight loss.  CV: Denies chest pain, palpitations, syncope, peripheral edema, and claudication. Resp: Denies dyspnea at rest, cough, wheezing, coughing up blood, and pleurisy. GI: see HPI  Derm: Denies rash, itching, dry skin Psych: Denies depression, anxiety, memory loss, confusion. No homicidal or suicidal ideation.  Heme: Denies bruising, bleeding, and enlarged lymph nodes.  Physical Exam: BP 109/75   Pulse 92   Temp (!) 97 F (36.1 C) (Oral)   Ht 5\' 3"  (1.6 m)   Wt 155 lb 9.6 oz (70.6 kg)   BMI 27.56 kg/m  General:   Alert and oriented. No distress noted. Pleasant and cooperative. Appears older than stated age.  Head:  Normocephalic and atraumatic. Eyes:  Conjuctiva clear without scleral icterus. Mouth:  Oral mucosa pink and moist.  Abdomen:  +BS, soft, non-tender and non-distended. No rebound or guarding. Umbilical hernia easily reducible.  Msk:  Symmetrical without gross deformities. Normal posture. Extremities:  Without edema. Neurologic:  Alert and  oriented x4 Psych:  Alert and cooperative. Normal mood and affect.   Lab Results  Component Value Date   CREATININE 1.13 05/25/2017   BUN 35 (H) 05/25/2017   NA 141 05/25/2017   K 5.0 05/25/2017   CL 101 05/25/2017   CO2 28 05/25/2017

## 2017-07-14 ENCOUNTER — Encounter: Payer: Self-pay | Admitting: Cardiology

## 2017-07-19 ENCOUNTER — Ambulatory Visit: Payer: Medicare Other | Admitting: Gastroenterology

## 2017-07-19 NOTE — Assessment & Plan Note (Addendum)
56 year old male with history of NASH cirrhosis, ETOH use, treated empirically for AMA negative PBC with Urso, undergoing TIPS 01/12/17. Remains on low dose lasix 20 mg and aldactone 50 mg daily, which has worked well s/p TIPS procedure. Hospitalized with decompensation, encephalopathy several months ago but doing well on Lactulose and Xifaxan now, at baseline. He is due for surveillance EGD for varices. Ultrasound to be arranged in near future as well. Return after EGD for close follow-up.  Proceed with upper endoscopy in the near future with Dr. Gala Romney. The risks, benefits, and alternatives have been discussed in detail with patient. They have stated understanding and desire to proceed.  PROPOFOL due to polypharmacy Will review discontinuing aspirin with cardiology: I have sent a message to Dr. Harl Bowie I have discussed with patient compliance with Urso: there was some question of skipping medication prior to last hospitalization.

## 2017-07-20 ENCOUNTER — Telehealth: Payer: Self-pay | Admitting: Gastroenterology

## 2017-07-20 NOTE — Progress Notes (Signed)
cc'ed to pcp °

## 2017-07-20 NOTE — Telephone Encounter (Signed)
Please let patient and wife know that I spoke with cardiologist, Dr. Harl Bowie. May discontinue aspirin 325 mg. His risks with taking aspirin in setting of chronic liver disease outweighs any cardiac benefits at this point.

## 2017-07-20 NOTE — Telephone Encounter (Signed)
Pts spouse notified that pt may d/c ASA.

## 2017-07-21 ENCOUNTER — Other Ambulatory Visit (HOSPITAL_COMMUNITY): Payer: Self-pay | Admitting: Interventional Radiology

## 2017-07-21 DIAGNOSIS — K7581 Nonalcoholic steatohepatitis (NASH): Principal | ICD-10-CM

## 2017-07-21 DIAGNOSIS — K7460 Unspecified cirrhosis of liver: Secondary | ICD-10-CM

## 2017-07-26 ENCOUNTER — Ambulatory Visit (HOSPITAL_COMMUNITY)
Admission: RE | Admit: 2017-07-26 | Discharge: 2017-07-26 | Disposition: A | Discharge status: Home / Self Care | Payer: Medicare Other | Source: Ambulatory Visit | Attending: Gastroenterology | Admitting: Gastroenterology

## 2017-07-26 DIAGNOSIS — K7460 Unspecified cirrhosis of liver: Secondary | ICD-10-CM | POA: Insufficient documentation

## 2017-07-26 DIAGNOSIS — R188 Other ascites: Secondary | ICD-10-CM | POA: Insufficient documentation

## 2017-07-26 DIAGNOSIS — K7581 Nonalcoholic steatohepatitis (NASH): Secondary | ICD-10-CM | POA: Diagnosis present

## 2017-07-28 NOTE — Patient Instructions (Signed)
[name]  07/28/2017     @PREFPERIOPPHARMACY @   Your procedure is scheduled on  08/08/2017   Report to [facility] at  1245  P.M.  Call this number if you have problems the morning of surgery:  [phone]   Remember:  Do not eat food or drink liquids after midnight.  Take these medicines the morning of surgery with A SIP OF WATER zyrtec, levoxyl, toprol XL, provigil, prilosec, ursodiol. Use your nebulizer and your inhaler before you come. Take 10 units of Humalog the night before your procedure. DO NOT take any medications for diabetes the morning of your procedure.   Do not wear jewelry, make-up or nail polish.  Do not wear lotions, powders, or perfumes, or deoderant.  Do not shave 48 hours prior to surgery.  Men may shave face and neck.  Do not bring valuables to the hospital.  [facility] is not responsible for any belongings or valuables.  Contacts, dentures or bridgework may not be worn into surgery.  Leave your suitcase in the car.  After surgery it may be brought to your room.  For patients admitted to the hospital, discharge time will be determined by your treatment team.  Patients discharged the day of surgery will not be allowed to drive home.   Name and phone number of your driver:   family Special instructions:  Follow the diet and prep instructions given to you by Dr Roseanne Kaufman office.  Please read over the following fact sheets that you were given. Anesthesia Post-op Instructions and Care and Recovery After Surgery       Esophagogastroduodenoscopy Esophagogastroduodenoscopy (EGD) is a procedure to examine the lining of the esophagus, stomach, and first part of the small intestine (duodenum). This procedure is done to check for problems such as inflammation, bleeding, ulcers, or growths. During this procedure, a long, flexible, lighted tube with a camera attached (endoscope) is inserted down the throat. Tell a health care provider about:  Any  allergies you have.  All medicines you are taking, including vitamins, herbs, eye drops, creams, and over-the-counter medicines.  Any problems you or family members have had with anesthetic medicines.  Any blood disorders you have.  Any surgeries you have had.  Any medical conditions you have.  Whether you are pregnant or may be pregnant. What are the risks? Generally, this is a safe procedure. However, problems may occur, including:  Infection.  Bleeding.  A tear (perforation) in the esophagus, stomach, or duodenum.  Trouble breathing.  Excessive sweating.  Spasms of the larynx.  A slowed heartbeat.  Low blood pressure.  What happens before the procedure?  Follow instructions from your health care provider about eating or drinking restrictions.  Ask your health care provider about: ? Changing or stopping your regular medicines. This is especially important if you are taking diabetes medicines or blood thinners. ? Taking medicines such as aspirin and ibuprofen. These medicines can thin your blood. Do not take these medicines before your procedure if your health care provider instructs you not to.  Plan to have someone take you home after the procedure.  If you wear dentures, be ready to remove them before the procedure. What happens during the procedure?  To reduce your risk of infection, your health care team will wash or sanitize their hands.  An IV tube will be put in a vein in your hand or arm. You will get medicines and fluids through this tube.  You will be given one or more of the following: ? A medicine to help you relax (sedative). ? A medicine to numb the area (local anesthetic). This medicine may be sprayed into your throat. It will make you feel more comfortable and keep you from gagging or coughing during the procedure. ? A medicine for pain.  A mouth guard may be placed in your mouth to protect your teeth and to keep you from biting on the  endoscope.  You will be asked to lie on your left side.  The endoscope will be lowered down your throat into your esophagus, stomach, and duodenum.  Air will be put into the endoscope. This will help your health care provider see better.  The lining of your esophagus, stomach, and duodenum will be examined.  Your health care provider may: ? Take a tissue sample so it can be looked at in a lab (biopsy). ? Remove growths. ? Remove objects (foreign bodies) that are stuck. ? Treat any bleeding with medicines or other devices that stop tissue from bleeding. ? Widen (dilate) or stretch narrowed areas of your esophagus and stomach.  The endoscope will be taken out. The procedure may vary among health care providers and hospitals. What happens after the procedure?  Your blood pressure, heart rate, breathing rate, and blood oxygen level will be monitored often until the medicines you were given have worn off.  Do not eat or drink anything until the numbing medicine has worn off and your gag reflex has returned. This information is not intended to replace advice given to you by your health care provider. Make sure you discuss any questions you have with your health care provider. Document Released: 01/07/2005 Document Revised: 02/12/2016 Document Reviewed: 07/31/2015 Elsevier Interactive Patient Education  2018 [employer]. Esophagogastroduodenoscopy, Care After Refer to this sheet in the next few weeks. These instructions provide you with information about caring for yourself after your procedure. Your health care provider may also give you more specific instructions. Your treatment has been planned according to current medical practices, but problems sometimes occur. Call your health care provider if you have any problems or questions after your procedure. What can I expect after the procedure? After the procedure, it is common to have:  A sore  throat.  Nausea.  Bloating.  Dizziness.  Fatigue.  Follow these instructions at home:  Do not eat or drink anything until the numbing medicine (local anesthetic) has worn off and your gag reflex has returned. You will know that the local anesthetic has worn off when you can swallow comfortably.  Do not drive for 24 hours if you received a medicine to help you relax (sedative).  If your health care provider took a tissue sample for testing during the procedure, make sure to get your test results. This is your responsibility. Ask your health care provider or the department performing the test when your results will be ready.  Keep all follow-up visits as told by your health care provider. This is important. Contact a health care provider if:  You cannot stop coughing.  You are not urinating.  You are urinating less than usual. Get help right away if:  You have trouble swallowing.  You cannot eat or drink.  You have throat or chest pain that gets worse.  You are dizzy or light-headed.  You faint.  You have nausea or vomiting.  You have chills.  You have a fever.  You have severe abdominal pain.  You have  black, tarry, or bloody stools. This information is not intended to replace advice given to you by your health care provider. Make sure you discuss any questions you have with your health care provider. Document Released: 08/23/2012 Document Revised: 02/12/2016 Document Reviewed: 07/31/2015 Elsevier Interactive Patient Education  2018 [employer] Anesthesia is a term that refers to techniques, procedures, and medicines that help a person stay safe and comfortable during a medical procedure. Monitored anesthesia care, or sedation, is one type of anesthesia. Your anesthesia specialist may recommend sedation if you will be having a procedure that does not require you to be unconscious, such as:  Cataract surgery.  A dental procedure.  A  biopsy.  A colonoscopy.  During the procedure, you may receive a medicine to help you relax (sedative). There are three levels of sedation:  Mild sedation. At this level, you may feel awake and relaxed. You will be able to follow directions.  Moderate sedation. At this level, you will be sleepy. You may not remember the procedure.  Deep sedation. At this level, you will be asleep. You will not remember the procedure.  The more medicine you are given, the deeper your level of sedation will be. Depending on how you respond to the procedure, the anesthesia specialist may change your level of sedation or the type of anesthesia to fit your needs. An anesthesia specialist will monitor you closely during the procedure. Let your health care provider know about:  Any allergies you have.  All medicines you are taking, including vitamins, herbs, eye drops, creams, and over-the-counter medicines.  Any use of steroids (by mouth or as a cream).  Any problems you or family members have had with sedatives and anesthetic medicines.  Any blood disorders you have.  Any surgeries you have had.  Any medical conditions you have, such as sleep apnea.  Whether you are pregnant or may be pregnant.  Any use of cigarettes, alcohol, or street drugs. What are the risks? Generally, this is a safe procedure. However, problems may occur, including:  Getting too much medicine (oversedation).  Nausea.  Allergic reaction to medicines.  Trouble breathing. If this happens, a breathing tube may be used to help with breathing. It will be removed when you are awake and breathing on your own.  Heart trouble.  Lung trouble.  Before the procedure Staying hydrated Follow instructions from your health care provider about hydration, which may include:  Up to 2 hours before the procedure - you may continue to drink clear liquids, such as water, clear fruit juice, black coffee, and plain tea.  Eating and  drinking restrictions Follow instructions from your health care provider about eating and drinking, which may include:  8 hours before the procedure - stop eating heavy meals or foods such as meat, fried foods, or fatty foods.  6 hours before the procedure - stop eating light meals or foods, such as toast or cereal.  6 hours before the procedure - stop drinking milk or drinks that contain milk.  2 hours before the procedure - stop drinking clear liquids.  Medicines Ask your health care provider about:  Changing or stopping your regular medicines. This is especially important if you are taking diabetes medicines or blood thinners.  Taking medicines such as aspirin and ibuprofen. These medicines can thin your blood. Do not take these medicines before your procedure if your health care provider instructs you not to.  Tests and exams  You will have a physical  exam.  You may have blood tests done to show: ? How well your kidneys and liver are working. ? How well your blood can clot.  General instructions  Plan to have someone take you home from the hospital or clinic.  If you will be going home right after the procedure, plan to have someone with you for 24 hours.  What happens during the procedure?  Your blood pressure, heart rate, breathing, level of pain and overall condition will be monitored.  An IV tube will be inserted into one of your veins.  Your anesthesia specialist will give you medicines as needed to keep you comfortable during the procedure. This may mean changing the level of sedation.  The procedure will be performed. After the procedure  Your blood pressure, heart rate, breathing rate, and blood oxygen level will be monitored until the medicines you were given have worn off.  Do not drive for 24 hours if you received a sedative.  You may: ? Feel sleepy, clumsy, or nauseous. ? Feel forgetful about what happened after the procedure. ? Have a sore throat if  you had a breathing tube during the procedure. ? Vomit. This information is not intended to replace advice given to you by your health care provider. Make sure you discuss any questions you have with your health care provider. Document Released: 06/02/2005 Document Revised: 02/13/2016 Document Reviewed: 12/28/2015 Elsevier Interactive Patient Education  [employer].

## 2017-08-01 ENCOUNTER — Encounter (HOSPITAL_COMMUNITY): Payer: Self-pay

## 2017-08-01 ENCOUNTER — Other Ambulatory Visit: Payer: Self-pay

## 2017-08-01 ENCOUNTER — Encounter (HOSPITAL_COMMUNITY)
Admission: RE | Admit: 2017-08-01 | Discharge: 2017-08-01 | Disposition: A | Discharge status: Home / Self Care | Payer: Medicare Other | Source: Ambulatory Visit | Attending: Internal Medicine | Admitting: Internal Medicine

## 2017-08-01 DIAGNOSIS — Z01818 Encounter for other preprocedural examination: Secondary | ICD-10-CM | POA: Insufficient documentation

## 2017-08-01 HISTORY — DX: Unspecified hearing loss, unspecified ear: H91.90

## 2017-08-01 LAB — CBC WITH DIFFERENTIAL/PLATELET
BASOS ABS: 0.1 10*3/uL (ref 0.0–0.1)
Basophils Relative: 1 %
EOS ABS: 0.2 10*3/uL (ref 0.0–0.7)
EOS PCT: 2 %
HCT: 37.7 % — ABNORMAL LOW (ref 39.0–52.0)
Hemoglobin: 12.1 g/dL — ABNORMAL LOW (ref 13.0–17.0)
LYMPHS PCT: 12 %
Lymphs Abs: 1.2 10*3/uL (ref 0.7–4.0)
MCH: 32.9 pg (ref 26.0–34.0)
MCHC: 32.1 g/dL (ref 30.0–36.0)
MCV: 102.4 fL — AB (ref 78.0–100.0)
MONO ABS: 1.1 10*3/uL — AB (ref 0.1–1.0)
Monocytes Relative: 11 %
Neutro Abs: 7.6 10*3/uL (ref 1.7–7.7)
Neutrophils Relative %: 74 %
PLATELETS: 330 10*3/uL (ref 150–400)
RBC: 3.68 MIL/uL — AB (ref 4.22–5.81)
RDW: 15.9 % — AB (ref 11.5–15.5)
WBC: 10.1 10*3/uL (ref 4.0–10.5)

## 2017-08-01 LAB — BASIC METABOLIC PANEL
ANION GAP: 8 (ref 5–15)
BUN: 30 mg/dL — ABNORMAL HIGH (ref 6–20)
CALCIUM: 8.8 mg/dL — AB (ref 8.9–10.3)
CO2: 29 mmol/L (ref 22–32)
Chloride: 102 mmol/L (ref 101–111)
Creatinine, Ser: 1.16 mg/dL (ref 0.61–1.24)
GFR calc Af Amer: >60 mL/min (ref >60)
GFR calc non Af Amer: >60 mL/min (ref >60)
GLUCOSE: 126 mg/dL — AB (ref 65–99)
POTASSIUM: 4.3 mmol/L (ref 3.5–5.1)
SODIUM: 139 mmol/L (ref 135–145)

## 2017-08-03 ENCOUNTER — Telehealth: Payer: Self-pay | Admitting: *Deleted

## 2017-08-03 NOTE — Telephone Encounter (Signed)
Spoke with pt spouse Pam and is aware procedure time for 08/08/17 has changed to 12:30pm. Aware arrival time at [facility] is 11:00am. NPO morning of procedure after 8:30am. She verbalized understanding and needed nothing further

## 2017-08-04 ENCOUNTER — Ambulatory Visit: Payer: Medicare Other | Admitting: Cardiology

## 2017-08-04 ENCOUNTER — Encounter: Payer: Self-pay | Admitting: Cardiology

## 2017-08-04 VITALS — BP 108/70 | HR 84 | Ht 60.0 in | Wt 156.0 lb

## 2017-08-04 DIAGNOSIS — I495 Sick sinus syndrome: Secondary | ICD-10-CM

## 2017-08-04 DIAGNOSIS — I4892 Unspecified atrial flutter: Secondary | ICD-10-CM

## 2017-08-04 DIAGNOSIS — I350 Nonrheumatic aortic (valve) stenosis: Secondary | ICD-10-CM

## 2017-08-04 DIAGNOSIS — I10 Essential (primary) hypertension: Secondary | ICD-10-CM | POA: Diagnosis not present

## 2017-08-04 MED ORDER — SPIRONOLACTONE 50 MG PO TABS: 50.0000 mg | ORAL_TABLET | Freq: Every day | ORAL | 3 refills | Status: AC

## 2017-08-04 MED ORDER — FUROSEMIDE 20 MG PO TABS: 20.0000 mg | ORAL_TABLET | Freq: Every day | ORAL | 3 refills | Status: AC

## 2017-08-04 MED ORDER — METOPROLOL SUCCINATE ER 25 MG PO TB24: 25.0000 mg | ORAL_TABLET | Freq: Every day | ORAL | 3 refills | Status: AC

## 2017-08-04 NOTE — Progress Notes (Signed)
Clinical Summary [name] is a 56 y.o.male seen today for follow up of the following medical problems.   1. Atrial flutter  - long term history of nose bleeds. We tried several anticoag agents, however all severely increased his nose bleeds requiring ER visits and nose packing. He states he has seen ENT before and told he has a bleeding vessel that there is nothing can be done for.   -  Has not been on anticoag due to severe nose bleeds in the past. Now with severe cirrhosis as well - no recent palpitations - compliant with meds  2. Aortic stenosis - moderate to severe AS by last echo.   - no recent SOB/DOE/chest pain/syncope  3. Tachy-Brady syndrme -denies any recent symptoms  4. HTN - he is compliant with meds  5. DM - followed by pcp  6. Cirrhosis - recent TIPS procedure, followed by GI. 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   7. COPD -followed by pcp, has been on home O2  8 . Chronic diastolic HF - mild edema at times. None recently, significant improvement after his TIPS procedure     Past Medical History:  Diagnosis Date  . Alcohol use   . Anemia   . Aortic stenosis    mild  . Arthritis   . Atrial flutter ([place])   . Benign hypertrophy of prostate    with urinary retention; repaired hypospadia; transurethral resection of the prostate   . Cardiac conduction disorder    status post pacemaker implantation in 1995 generator replaced 2005  . Chest pain 2007, 2009   cardiac cath > normal coronary arterie; nl left ventricular function  . CHF (congestive heart failure) ([place])   . Cirrhosis ([place]) 11/2016  . Congenital deafness   . COPD (chronic obstructive pulmonary disease) ([place])   . Diabetes mellitus    +Insulin  . Dyspnea   . Edema   . GERD (gastroesophageal reflux disease)    status post multiple dilatations for stricture  . 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  Hepatitis    Thinks it was B  . History of kidney stones   . HOH (hard of hearing)   . HTN (hypertension)   . Hyperlipidemia    statin discontinued due to abn LFTs  . Hypothyroidism   . Mitral regurgitation    insignificant  . Obesity   . Obesity 4/08   BMI= 40  . Pelvic mass    identified in 2009, stable 2010  . PONV (postoperative nausea and vomiting)   . Presence of permanent cardiac pacemaker   . Sleep apnea    BiPAP and continuous oxygen  . Syncope   . Thyroid disease      Allergies  Allergen Reactions  . Penicillins Swelling and Other (See Comments)    SWELLING OF AIRWAY  PATIENT HAD A PCN REACTION WITH IMMEDIATE RASH, FACIAL/TONGUE/THROAT SWELLING, SOB, OR LIGHTHEADEDNESS WITH HYPOTENSION:  #  #  #  YES  #  #  #  Has patient had a PCN reaction causing severe rash involving mucus membranes or skin necrosis: No Has patient had a PCN reaction that required hospitalization No Has patient had a PCN reaction occurring within the last 10 years: No  . Enalapril Cough  . Metformin And Related Diarrhea  . Eliquis [Apixaban] Other (See Comments)    DOSE RELATED BLEEDING     Current Outpatient Medications  Medication Sig Dispense Refill  . acetaminophen (TYLENOL) 325 MG tablet Take 325 mg by mouth every 6 (six) hours as needed for mild pain or headache.     . albuterol (PROVENTIL HFA;VENTOLIN HFA) 108 (90 Base) MCG/ACT inhaler Inhale 2 puffs into the lungs every 6 (six) hours as needed for wheezing or shortness of breath.    Marland Kitchen albuterol (PROVENTIL) (2.5 MG/3ML) 0.083% nebulizer solution Take 2.5 mg by nebulization every 6 (six) hours as needed for wheezing or shortness of breath.    . Calcium Carb-Cholecalciferol (CALCIUM 600+D) 600-800 MG-UNIT TABS Take 1 tablet by mouth every evening.     . cetirizine (ZYRTEC) 10 MG tablet Take 10 mg by mouth daily.    . feeding supplement, ENSURE ENLIVE, (ENSURE ENLIVE) LIQD Take 237 mLs by mouth 3 (three) times daily between meals.  (Patient taking differently: Take 237 mLs by mouth 3 (three) times daily as needed (for supplement when not eating). ) 237 mL 12  . furosemide (LASIX) 20 MG tablet Take 1 tablet (20 mg total) by mouth daily. 90 tablet 3  . gentamicin ointment (GARAMYCIN) 0.1 % Apply 1 application topically at bedtime as needed (for nose bleeds).     . Glucosamine-Chondroit-Vit C-Mn (GLUCOSAMINE CHONDR 1500 COMPLX PO) Take 1 tablet by mouth daily.    Marland Kitchen HUMALOG MIX 75/25 KWIKPEN (75-25) 100 UNIT/ML Kwikpen Inject 8 Units into the skin 2 (two) times daily. 15 units in the morning and 15 units in the evening (Patient taking differently: Inject 2-8 Units into the skin See admin instructions. Inject 8 units SQ in the morning unless BS 100 or below only inject 4 units SQ, inject 4 units SQ at supper time unless BS 100 or below only inject 2 units SQ) 10 mL 0  . lactulose (CHRONULAC) 10 GM/15ML solution 10cc (2 teaspoons) three times daily; Titrate to 2-3 semi formed stools a day (Patient taking differently: Take 6.6 g by mouth 2 (two) times daily. ) 1892 mL 5  . levothyroxine (LEVOXYL) 50 MCG tablet Take 50 mcg by mouth daily before breakfast.     . metoprolol succinate (TOPROL-XL) 25 MG 24 hr tablet Take 25 mg by mouth daily.    . modafinil (PROVIGIL) 200 MG tablet Take 200 mg by mouth daily.     . mometasone (NASONEX) 50 MCG/ACT nasal spray Place 2 sprays into the nose daily as needed (for congestion/allergies).     . Multiple Vitamins-Minerals (CENTRUM) tablet Take 1 tablet by mouth every evening.     . nitroGLYCERIN (NITROSTAT) 0.4 MG SL tablet Place 1 tablet (0.4 mg total) under the tongue every 5 (five) minutes as needed for chest pain. 25 tablet 3  . NON FORMULARY Bipap 16.0/e    . omeprazole (PRILOSEC) 40 MG capsule TAKE 1 CAPSULE BY MOUTH ONCE DAILY FOR REFLUX. (Patient taking differently: TAKE 40 MG BY MOUTH ONCE DAILY FOR REFLUX.) 30 capsule 0  . rifaximin (XIFAXAN) 550 MG TABS tablet Take 1 tablet (550 mg total)  by mouth 2 (two) times daily. 180 tablet 3  . spironolactone (ALDACTONE) 50 MG tablet Take 1 tablet (50 mg total) by mouth daily. 90 tablet 3  . Tetrahydrozoline HCl (VISINE OP) Place 2 drops into both eyes 2 (two) times daily as needed (for allergies).    . ursodiol (ACTIGALL) 500 MG tablet TAKE 1 TABLET BY MOUTH TWICE DAILY (Patient taking differently: TAKE 500 MG BY MOUTH TWICE DAILY) 60 tablet 11   No current facility-administered medications for this visit.  Past Surgical History:  Procedure Laterality Date  . A-V CARDIAC PACEMAKER INSERTION  1995  . COLONOSCOPY  2008  . COLONOSCOPY N/A 06/23/2015   LFY:BOFBPZW diverticulosis  . ESOPHAGOGASTRODUODENOSCOPY N/A 06/23/2015   CHE:NIDP portal gastropathy  . IR GENERIC HISTORICAL  12/14/2016   IR PARACENTESIS 12/14/2016 Sandi Mariscal, MD [facility]  . IR GENERIC HISTORICAL  12/14/2016   IR TIPS 12/14/2016 Sandi Mariscal, MD [facility]  . IR PARACENTESIS  01/12/2017  . IR RADIOLOGIST EVAL & MGMT  01/06/2017  . IR RADIOLOGIST EVAL & MGMT  02/08/2017  . IR RADIOLOGIST EVAL & MGMT  11/30/2016  . IR TIPS  01/12/2017  . NASAL SINUS SURGERY     partially removed  . ORCHIECTOMY  1990   bilateral; ?neoplasm  . PACEMAKER INSERTION  05/2004; 10/04/2013   MDT dual chamber pacemaker; gen change 10/04/2013 (MDT ADDRL1)  . PERMANENT PACEMAKER GENERATOR CHANGE N/A 10/04/2013   Procedure: PERMANENT PACEMAKER GENERATOR CHANGE;  Surgeon: Evans Lance, MD;  Location[facility] C[facility] LAB;  Service: Cardiovascular;  Laterality: N/A;  . RADIOLOGY WITH ANESTHESIA N/A 12/14/2016   Procedure: TIPS;  Surgeon: Sandi Mariscal, MD;  Location: [place];  Service: Radiology;  Laterality: N/A;  . RADIOLOGY WITH ANESTHESIA N/A 01/12/2017   Procedure: TIPS;  Surgeon: Sandi Mariscal, MD;  Location: [place];  Service: Radiology;  Laterality: N/A;  . REPAIR HYPOSPADIAS W/ URETHROPLASTY    . TONSILLECTOMY AND ADENOIDECTOMY    . TRANSURETHRAL RESECTION OF PROSTATE     for BPH     Allergies    Allergen Reactions  . Penicillins Swelling and Other (See Comments)    SWELLING OF AIRWAY  PATIENT HAD A PCN REACTION WITH IMMEDIATE RASH, FACIAL/TONGUE/THROAT SWELLING, SOB, OR LIGHTHEADEDNESS WITH HYPOTENSION:  #  #  #  YES  #  #  #  Has patient had a PCN reaction causing severe rash involving mucus membranes or skin necrosis: No Has patient had a PCN reaction that required hospitalization No Has patient had a PCN reaction occurring within the last 10 years: No  . Enalapril Cough  . Metformin And Related Diarrhea  . Eliquis [Apixaban] Other (See Comments)    DOSE RELATED BLEEDING      Family History  Problem Relation Age of Onset  . COPD Mother   . Hypertension Mother   . Congestive Heart Failure Mother   . Pneumonia Father   . Heart disease Other   . Hypertension Other   . Colon cancer Neg Hx      Social History Mr. Sperling reports that  has never smoked. he has never used smokeless tobacco. Mr. Crill reports that he does not drink alcohol.   Review of Systems CONSTITUTIONAL: No weight loss, fever, chills, weakness or fatigue.  HEENT: Eyes: No visual loss, blurred vision, double vision or yellow sclerae.No hearing loss, sneezing, congestion, runny nose or sore throat.  SKIN: No rash or itching.  CARDIOVASCULAR: per hpi RESPIRATORY: No shortness of breath, cough or sputum.  GASTROINTESTINAL: No anorexia, nausea, vomiting or diarrhea. No abdominal pain or blood.  GENITOURINARY: No burning on urination, no polyuria NEUROLOGICAL: No headache, dizziness, syncope, paralysis, ataxia, numbness or tingling in the extremities. No change in bowel or bladder control.  MUSCULOSKELETAL: No muscle, back pain, joint pain or stiffness.  LYMPHATICS: No enlarged nodes. No history of splenectomy.  PSYCHIATRIC: No history of depression or anxiety.  ENDOCRINOLOGIC: No reports of sweating, cold or heat intolerance. No polyuria or polydipsia.  Marland Kitchen  Physical Examination Vitals:    08/04/17 1312  BP: 108/70  Pulse: 84  SpO2: 90%   Vitals:   08/04/17 1312  Weight: 156 lb (70.8 kg)  Height: 5' (1.524 m)    Gen: resting comfortably, no acute distress HEENT: no scleral icterus, pupils equal round and reactive, no palptable cervical adenopathy,  CV: RRR, no m/r/g, no jvd Resp: Clear to auscultation bilaterally GI: abdomen is soft, non-tender, non-distended, normal bowel sounds, no hepatosplenomegaly MSK: extremities are warm, no edema.  Skin: warm, no rash Neuro:  no focal deficits Psych: appropriate affect   Diagnostic Studies   02/2013 Echo limited evaluate AS: mild AS (mean grad 16   01/2013 Echo: difficult study, LV function mildly reduced ( no percentage given), possible moderate to severe AS (AVA .53) mean grad 27 mmHg.   07/30/13 Clinic EKG: sinus rhythm, 1st degree AV block, RBBB   Jan 2015 Echo LVEF 55-60%, cannot eval diastolic function, mod AS (area 1, mean grad 20).    02/2014 Carotid US IMPRESSION: No evidence of internal carotid artery stenosis.  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    Assessment and Plan   1. Atrial flutter  -asymptomatic, continue current meds - Severe nose bleeds on several anticoag agents, now with severe cirrhosis. He is not on anticoag at this time.   2. Aortic stenosis - moderate to severe by last echo - asymptomatic, we will continue to monitor. With his advanced liver disease I doubt he would be a candidate for intervention in the future.   3. Tachy-Brady syndrome - asymptomatic, continue to follow in device clinic   4. HTN - at goal, continue current meds     Arnoldo Lenis, M.D.

## 2017-08-04 NOTE — Progress Notes (Signed)
No HCC. [name] in 6 months.

## 2017-08-04 NOTE — Patient Instructions (Signed)

## 2017-08-05 NOTE — Progress Notes (Signed)
ON RECALL  °

## 2017-08-08 ENCOUNTER — Encounter (HOSPITAL_COMMUNITY)
Admission: RE | Disposition: A | Discharge status: Home / Self Care | Payer: Self-pay | Source: Ambulatory Visit | Attending: Internal Medicine

## 2017-08-08 ENCOUNTER — Encounter (HOSPITAL_COMMUNITY): Payer: Self-pay | Admitting: Anesthesiology

## 2017-08-08 ENCOUNTER — Ambulatory Visit (HOSPITAL_COMMUNITY)
Admission: RE | Admit: 2017-08-08 | Discharge: 2017-08-08 | Disposition: A | Discharge status: Home / Self Care | Payer: Medicare Other | Source: Ambulatory Visit | Attending: Internal Medicine | Admitting: Internal Medicine

## 2017-08-08 ENCOUNTER — Telehealth: Payer: Self-pay | Admitting: Internal Medicine

## 2017-08-08 SURGERY — ESOPHAGOGASTRODUODENOSCOPY (EGD) WITH PROPOFOL
Anesthesia: Monitor Anesthesia Care

## 2017-08-08 NOTE — Telephone Encounter (Signed)
Per Dr. Gala Romney pt will need an OV in December/January to r/s EGD. LMOVM

## 2017-08-08 NOTE — Telephone Encounter (Signed)
Christine from Short Stay called to say the RMR and Dr Patsey Berthold cancelled the EGD on this patient because he ate a whole pan of Jello. RMR wants patient reschedule to the beginning of the day with Propofol.

## 2017-08-08 NOTE — Telephone Encounter (Signed)
Spoke with spouse and pt has pending appt 09/15/17 at 10:00am. Nothing further needed

## 2017-08-08 NOTE — Progress Notes (Signed)
Pt. Stated that he ate jello this morning prior to coming for procedure. Pt.s wife stated that is was the amount of what a box makes.  Dr. Gala Romney & Dr. Patsey Berthold aware & both agreed upon cancelling procedure d/t volume that was consumed.  Explained to pt. & wife & told them that the Dr.Rourks office will call to reschedule.  Pt. & wife both verbalized understanding.

## 2017-08-09 ENCOUNTER — Encounter: Payer: Self-pay | Admitting: Cardiology

## 2017-08-09 ENCOUNTER — Other Ambulatory Visit: Payer: Self-pay | Admitting: Nurse Practitioner

## 2017-08-09 ENCOUNTER — Other Ambulatory Visit: Payer: Self-pay | Admitting: Cardiology

## 2017-08-09 ENCOUNTER — Other Ambulatory Visit: Payer: Self-pay | Admitting: Gastroenterology

## 2017-08-24 ENCOUNTER — Ambulatory Visit: Payer: Medicare Other | Admitting: Gastroenterology

## 2017-08-29 ENCOUNTER — Encounter: Payer: Medicare Other | Admitting: *Deleted

## 2017-08-30 ENCOUNTER — Other Ambulatory Visit: Payer: Self-pay | Admitting: Gastroenterology

## 2017-08-30 ENCOUNTER — Other Ambulatory Visit: Payer: Medicare Other

## 2017-09-02 ENCOUNTER — Encounter: Payer: Self-pay | Admitting: Cardiology

## 2017-09-02 NOTE — Progress Notes (Signed)
Letter  

## 2017-09-06 ENCOUNTER — Other Ambulatory Visit: Payer: Self-pay | Admitting: Cardiology

## 2017-09-06 ENCOUNTER — Ambulatory Visit (INDEPENDENT_AMBULATORY_CARE_PROVIDER_SITE_OTHER): Payer: Medicare Other | Admitting: *Deleted

## 2017-09-06 DIAGNOSIS — I495 Sick sinus syndrome: Secondary | ICD-10-CM | POA: Diagnosis not present

## 2017-09-07 ENCOUNTER — Encounter: Payer: Self-pay | Admitting: Cardiology

## 2017-09-07 NOTE — Progress Notes (Signed)
Remote pacemaker transmission.   

## 2017-09-08 LAB — CUP PACEART REMOTE DEVICE CHECK
Brady Statistic AP VP Percent: 0 %
Brady Statistic AS VP Percent: 0 %
Brady Statistic AS VS Percent: 100 %
Implantable Lead Implant Date: 20050923
Implantable Lead Location: 753859
Implantable Lead Model: 4524
Implantable Lead Model: 5024
Implantable Pulse Generator Implant Date: 20150115
Lead Channel Impedance Value: 354 Ohm
Lead Channel Pacing Threshold Amplitude: 0.5 V
Lead Channel Setting Pacing Amplitude: 2 V
Lead Channel Setting Pacing Pulse Width: 0.4 ms
MDC IDC LEAD IMPLANT DT: 20050923
MDC IDC LEAD LOCATION: 753860
MDC IDC MSMT BATTERY IMPEDANCE: 183 Ohm
MDC IDC MSMT BATTERY REMAINING LONGEVITY: 130 mo
MDC IDC MSMT BATTERY VOLTAGE: 2.79 V
MDC IDC MSMT LEADCHNL RA IMPEDANCE VALUE: 264 Ohm
MDC IDC MSMT LEADCHNL RV PACING THRESHOLD PULSEWIDTH: 0.4 ms
MDC IDC SESS DTM: 20181218184001
MDC IDC SET LEADCHNL RA PACING AMPLITUDE: 2.5 V
MDC IDC SET LEADCHNL RV SENSING SENSITIVITY: 2 mV
MDC IDC STAT BRADY AP VS PERCENT: 0 %

## 2017-09-15 ENCOUNTER — Ambulatory Visit: Payer: Medicare Other | Admitting: Gastroenterology

## 2017-09-22 ENCOUNTER — Encounter: Payer: Self-pay | Admitting: *Deleted

## 2017-09-22 ENCOUNTER — Ambulatory Visit
Admission: RE | Admit: 2017-09-22 | Discharge: 2017-09-22 | Disposition: A | Discharge status: Home / Self Care | Payer: Medicare Other | Source: Ambulatory Visit | Attending: Interventional Radiology | Admitting: Interventional Radiology

## 2017-09-22 DIAGNOSIS — K7460 Unspecified cirrhosis of liver: Secondary | ICD-10-CM

## 2017-09-22 DIAGNOSIS — K7581 Nonalcoholic steatohepatitis (NASH): Principal | ICD-10-CM

## 2017-09-22 HISTORY — PX: IR RADIOLOGIST EVAL & MGMT: IMG5224

## 2017-09-22 NOTE — Progress Notes (Signed)
Patient ID: [name], male   DOB: [birth year], 57 y.o.   MRN: [id number]        Chief Complaint: Patient was seen in consultation today for  Chief Complaint  Patient presents with  . Follow-up    9  mo follow up TIPS    Referring Physician(s): Roseanne Kaufman  History of Present Illness: [name] is a 57 y.o. 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 He returns today to the interventional radiology clinic for postprocedural evaluation and management following the acquisition of a TIPS Doppler ultrasound. He is again accompanied by his wife, who largely serves as his historian.  The patient is again without complaint in regards to his cirrhosis. Specifically, no recurrent symptomatic ascites. No yellowing of the skin or eyes. No change in mental status. No change in bladder or bowel functions. No bloody or melanotic stools.  Past Medical History:  Diagnosis Date  . Alcohol use   . Anemia   . Aortic stenosis    mild  . Arthritis   . Atrial flutter ([place])   . Benign hypertrophy of prostate    with urinary retention; repaired hypospadia; transurethral resection of the prostate   . Cardiac conduction disorder    status post pacemaker implantation in 1995 generator replaced 2005  . Chest pain 2007, 2009   cardiac cath > normal coronary arterie; nl left ventricular function  . CHF (congestive heart failure) ([place])   . Cirrhosis ([place]) 11/2016  . Congenital deafness   . COPD (chronic obstructive pulmonary disease) ([place])   . Diabetes mellitus    +Insulin  . Dyspnea   . Edema   . GERD (gastroesophageal reflux disease)    status  post multiple dilatations for stricture  . 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 Hepatitis    Thinks it was B  . History of kidney stones   . HOH (hard of hearing)   . HTN (hypertension)   . Hyperlipidemia    statin discontinued due to abn LFTs  . Hypothyroidism   . Mitral regurgitation    insignificant  . Obesity   . Obesity 4/08   BMI= 40  . Pelvic mass    identified in 2009, stable 2010  . PONV (postoperative nausea and vomiting)   . Presence of permanent cardiac pacemaker   . Sleep apnea    BiPAP and continuous oxygen  . Syncope   . Thyroid disease     Past Surgical History:  Procedure Laterality Date  . A-V CARDIAC PACEMAKER INSERTION  1995  . COLONOSCOPY  2008  . COLONOSCOPY N/A 06/23/2015   GUR:KYHCWCB diverticulosis  . ESOPHAGOGASTRODUODENOSCOPY N/A 06/23/2015   JSE:GBTD portal gastropathy  . IR GENERIC HISTORICAL  12/14/2016   IR PARACENTESIS 12/14/2016 Sandi Mariscal, MD [facility]  . IR GENERIC HISTORICAL  12/14/2016   IR TIPS 12/14/2016 Sandi Mariscal, MD [facility]  . IR PARACENTESIS  01/12/2017  . IR RADIOLOGIST EVAL & MGMT  01/06/2017  . IR RADIOLOGIST EVAL & MGMT  02/08/2017  . IR RADIOLOGIST EVAL & MGMT  11/30/2016  . IR TIPS  01/12/2017  . NASAL SINUS SURGERY     partially removed  .  ORCHIECTOMY  1990   bilateral; ?neoplasm  . PACEMAKER INSERTION  05/2004; 10/04/2013   MDT dual chamber pacemaker; gen change 10/04/2013 (MDT ADDRL1)  . PERMANENT PACEMAKER GENERATOR CHANGE N/A 10/04/2013   Procedure: PERMANENT PACEMAKER GENERATOR CHANGE;  Surgeon: Evans Lance, MD;  Location: [facility] CATH LAB;  Service: Cardiovascular;  Laterality: N/A;  . RADIOLOGY WITH ANESTHESIA N/A 12/14/2016   Procedure: TIPS;  Surgeon: Sandi Mariscal, MD;  Location: [place];  Service: Radiology;  Laterality: N/A;  . RADIOLOGY WITH ANESTHESIA N/A 01/12/2017   Procedure: TIPS;  Surgeon: Sandi Mariscal, MD;  Location: [place];  Service: Radiology;   Laterality: N/A;  . REPAIR HYPOSPADIAS W/ URETHROPLASTY    . TONSILLECTOMY AND ADENOIDECTOMY    . TRANSURETHRAL RESECTION OF PROSTATE     for BPH    Allergies: Penicillins; Enalapril; Metformin and related; and Eliquis [apixaban]  Medications: Prior to Admission medications   Medication Sig Start Date End Date Taking? Authorizing Provider  albuterol (PROVENTIL HFA;VENTOLIN HFA) 108 (90 Base) MCG/ACT inhaler Inhale 2 puffs into the lungs every 6 (six) hours as needed for wheezing or shortness of breath.   Yes [provider]  albuterol (PROVENTIL) (2.5 MG/3ML) 0.083% nebulizer solution Take 2.5 mg by nebulization every 6 (six) hours as needed for wheezing or shortness of breath.   Yes [provider]  Calcium Carb-Cholecalciferol (CALCIUM 600+D) 600-800 MG-UNIT TABS Take 1 tablet by mouth every evening.    Yes [provider]  cetirizine (ZYRTEC) 10 MG tablet Take 10 mg by mouth daily.   Yes [provider]  feeding supplement, ENSURE ENLIVE, (ENSURE ENLIVE) LIQD Take 237 mLs by mouth 3 (three) times daily between meals. Patient taking differently: Take 237 mLs by mouth 3 (three) times daily as needed (for supplement when not eating).  04/19/17  Yes Dhungel, Nishant, MD  furosemide (LASIX) 20 MG tablet Take 1 tablet (20 mg total) daily by mouth. 08/04/17  Yes Branch, Alphonse Guild, MD  gentamicin ointment (GARAMYCIN) 0.1 % Apply 1 application topically at bedtime as needed (for nose bleeds).    Yes [provider]  HUMALOG MIX 75/25 KWIKPEN (75-25) 100 UNIT/ML Kwikpen Inject 8 Units into the skin 2 (two) times daily. 15 units in the morning and 15 units in the evening Patient taking differently: Inject 2-8 Units into the skin See admin instructions. Inject 8 units SQ in the morning unless BS 100 or below only inject 4 units SQ, inject 4 units SQ at supper time unless BS 100 or below only inject 2 units SQ 11/14/16  Yes Eber Jones, MD  lactulose  (CHRONULAC) 10 GM/15ML solution 10cc (2 teaspoons) three times daily; Titrate to 2-3 semi formed stools a day Patient taking differently: Take 6.6 g by mouth 2 (two) times daily.  05/25/17  Yes Annitta Needs, NP  levothyroxine (LEVOXYL) 50 MCG tablet Take 50 mcg by mouth daily before breakfast.    Yes [provider]  metoprolol succinate (TOPROL-XL) 25 MG 24 hr tablet Take 1 tablet (25 mg total) daily by mouth. 08/04/17  Yes Branch, Alphonse Guild, MD  modafinil (PROVIGIL) 200 MG tablet Take 200 mg by mouth daily.  01/13/15  Yes [provider]  Multiple Vitamins-Minerals (CENTRUM) tablet Take 1 tablet by mouth every evening.    Yes [provider]  nitroGLYCERIN (NITROSTAT) 0.4 MG SL tablet Place 1 tablet (0.4 mg total) under the tongue every 5 (five) minutes as needed for chest pain. 06/26/15  Yes Jory Sims  M, NP  NON FORMULARY Bipap 16.0/e   Yes [provider]  omeprazole (PRILOSEC) 40 MG capsule TAKE 1 CAPSULE BY MOUTH ONCE DAILY FOR REFLUX. 09/06/17  Yes Branch, Alphonse Guild, MD  spironolactone (ALDACTONE) 50 MG tablet Take 1 tablet (50 mg total) daily by mouth. 08/04/17  Yes Branch, Alphonse Guild, MD  ursodiol (ACTIGALL) 500 MG tablet TAKE 1 TABLET BY MOUTH TWICE DAILY 08/09/17  Yes Annitta Needs, NP  XIFAXAN 550 MG TABS tablet TAKE 1 TABLET BY MOUTH TWICE DAILY 08/09/17  Yes Annitta Needs, NP  XIFAXAN 550 MG TABS tablet TAKE 1 TABLET BY MOUTH TWICE DAILY 08/31/17  Yes Annitta Needs, NP  acetaminophen (TYLENOL) 325 MG tablet Take 325 mg by mouth every 6 (six) hours as needed for mild pain or headache.     [provider]  Glucosamine-Chondroit-Vit C-Mn (GLUCOSAMINE CHONDR 1500 COMPLX PO) Take 1 tablet by mouth daily.    [provider]  mometasone (NASONEX) 50 MCG/ACT nasal spray Place 2 sprays into the nose daily as needed (for congestion/allergies).     [provider]  Tetrahydrozoline HCl (VISINE OP) Place 2 drops into both eyes 2  (two) times daily as needed (for allergies).    [provider]     Family History  Problem Relation Age of Onset  . COPD Mother   . Hypertension Mother   . Congestive Heart Failure Mother   . Pneumonia Father   . Heart disease Other   . Hypertension Other   . Colon cancer Neg Hx     Social History   Socioeconomic History  . Marital status: Married    Spouse name: Not on file  . Number of children: Not on file  . Years of education: Not on file  . Highest education level: Not on file  Social Needs  . Financial resource strain: Not on file  . Food insecurity - worry: Not on file  . Food insecurity - inability: Not on file  . Transportation needs - medical: Not on file  . Transportation needs - non-medical: Not on file  Occupational History  . Not on file  Tobacco Use  . Smoking status: Never Smoker  . Smokeless tobacco: Never Used  . Tobacco comment: tobacco use- no   Substance and Sexual Activity  . Alcohol use: No    Alcohol/week: 0.0 oz    Comment: Hx. of alcohol use  . Drug use: No  . Sexual activity: No  Other Topics Concern  . Not on file  Social History Narrative   Part time.     ECOG Status: 1 - Symptomatic but completely ambulatory  Review of Systems: A 12 point ROS discussed and pertinent positives are indicated in the HPI above.  All other systems are negative.  Review of Systems  Constitutional: Negative for activity change, appetite change, fatigue and fever.  Respiratory: Negative.   Cardiovascular: Negative.   Gastrointestinal: Negative for abdominal distention, abdominal pain, blood in stool and vomiting.  Skin: Negative for color change.  Psychiatric/Behavioral: Negative.     Vital Signs: BP 110/72   Pulse 86   Temp 97.9 F (36.6 C) (Oral)   Resp 14   Ht 5\' 3"  (1.6 m)   Wt 152 lb (68.9 kg)   SpO2 94%   BMI 26.93 kg/m   Physical Exam  Imaging: US Abdominal Pelvic Art/vent Flow Doppler  Result Date: 09/22/2017 CLINICAL  DATA:  History of recurrent symptomatic ascites post gun sight  technique creation of a TIPS on 01/12/2017. EXAM: DUPLEX ULTRASOUND OF LIVER AND TIPS SHUNT TECHNIQUE: Color and duplex Doppler ultrasound was performed to evaluate the hepatic in-flow and out-flow vessels. COMPARISON:  TIPS ultrasound - 02/17/2017 ; right upper quadrant abdominal ultrasound - 07/26/2017 FINDINGS: Examination is degraded due to patient's inability to cooperate with breathing instructions. Portal Vein Velocities Main:  131 cm/sec Right:  66 cm/sec Left:  111 cm/sec TIPS Stent Velocities Proximal:  139 cm/sec; previously, 141 cm/sec Mid:  125 cm/sec; previously, 183 cm/sec Distal:  144 cm/sec; previously, 183 cm/sec Hepatic Vein Velocities Right:  144 cm/sec Mid:  48 cm/sec Left:  63 cm/sec Splenic Vein: 84 cm/sec Superior Mesenteric Vein: Obscured by bowel gas. Hepatic Artery: 155 cm/sec Ascites: Small amount of perihepatic ascites extending to the right lower abdominal quadrant. Varices: None visualized Other findings: None IMPRESSION: 1. Normal directional flow and velocities are seen throughout a widely patent TIPS. 2. Small amount of intra-abdominal ascites. Electronically Signed   By: Sandi Mariscal M.D.   On: 09/22/2017 12:33    Labs:  CBC: Recent Labs    02/17/17 1254 04/15/17 1450 04/16/17 0621 08/01/17 1252  WBC 8.0 8.7 7.4 10.1  HGB 13.1 13.5 13.1 12.1*  HCT 41.3 41.8 39.6 37.7*  PLT 351 255 241 330    COAGS: Recent Labs    12/10/16 1624 12/14/16 0651 01/11/17 1142  02/17/17 1254 04/15/17 1508 04/18/17 1501 05/25/17 1449  INR 1.09 1.10 1.10   < > 1.17 1.23 1.26 1.1  APTT 35 35 33  --   --   --   --   --    < > = values in this interval not displayed.    BMP: Recent Labs    04/21/17 0545 05/03/17 0935 05/25/17 1449 08/01/17 1252  NA 138 142 141 139  K 4.1 4.2 5.0 4.3  CL 102 103 101 102  CO2 27 28 28 29   GLUCOSE 115* 144* 124 126*  BUN 24* 32* 35* 30*  CALCIUM 8.9 9.2 9.3 8.8*    CREATININE 0.93 1.30* 1.13 1.16  GFRNONAA >60 60* 72 >60  GFRAA >60 >60 83 >60    LIVER FUNCTION TESTS: Recent Labs    04/17/17 0434 04/18/17 0630 04/19/17 0627 04/21/17 0545 05/25/17 1449  BILITOT 1.4* 1.7* 1.4* 1.3* 1.6*  AST 259* 219* 169* 117* 86*  ALT 140* 138* 116* 95* 66*  ALKPHOS 558* 633* 548* 514*  --   PROT 5.3* 5.6* 5.4* 5.4* 5.2*  ALBUMIN 2.2* 2.5* 2.5* 2.4*  --     TUMOR MARKERS: No results for input(s): AFPTM, CEA, CA199, CHROMGRNA in the last 8760 hours.  Assessment and Plan:  [name] is a 57 y.o. 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  The patient is again without complaint in regards to his cirrhosis  TIPS upper ultrasound performed earlier today demonstrates a widely patent hips with normal directional flow and velocities, similar to the 04/18/2017 examination.  Additionally, the patient has not undergone a paracentesis since prior to the creation of the TIPS.  I again explained to the patient that if he were to experience recurrent symptomatic ascites, this could herald TIPS dysfunction and he should not hesitate to call the interventional radiology clinic.  The patient has been encouraged to maintain follow-up appointments with Roseanne Kaufman in regards to his continual [place] screening, as he is at risk for liver cancer given his advanced cirrhosis.  PLAN: - Return to the interventional radiology clinic in 6 months (June 2019) with repeat surveillance TIPS Doppler ultrasound.  The patient was encouraged to call the interventional radiology clinic with any interval questions or concerns.  A copy of this  report was sent to the requesting provider on this date.  Electronically Signed: Sandi Mariscal 09/22/2017, 12:55 PM   I spent a total of 15 Minutes in face to face in clinical consultation, greater than 50% of which was counseling/coordinating care for post TIPS evaluation.

## 2017-10-02 ENCOUNTER — Emergency Department (HOSPITAL_COMMUNITY): Payer: Medicare HMO

## 2017-10-02 ENCOUNTER — Other Ambulatory Visit: Payer: Self-pay

## 2017-10-02 ENCOUNTER — Encounter (HOSPITAL_COMMUNITY): Payer: Self-pay | Admitting: Emergency Medicine

## 2017-10-02 ENCOUNTER — Emergency Department (HOSPITAL_COMMUNITY)
Admission: EM | Admit: 2017-10-02 | Discharge: 2017-10-02 | Disposition: A | Discharge status: Home / Self Care | Payer: Medicare HMO | Attending: Emergency Medicine | Admitting: Emergency Medicine

## 2017-10-02 DIAGNOSIS — K429 Umbilical hernia without obstruction or gangrene: Secondary | ICD-10-CM | POA: Insufficient documentation

## 2017-10-02 DIAGNOSIS — N390 Urinary tract infection, site not specified: Secondary | ICD-10-CM | POA: Insufficient documentation

## 2017-10-02 DIAGNOSIS — L24A9 Irritant contact dermatitis due friction or contact with other specified body fluids: Secondary | ICD-10-CM

## 2017-10-02 DIAGNOSIS — E039 Hypothyroidism, unspecified: Secondary | ICD-10-CM | POA: Insufficient documentation

## 2017-10-02 DIAGNOSIS — R188 Other ascites: Secondary | ICD-10-CM | POA: Diagnosis not present

## 2017-10-02 DIAGNOSIS — I110 Hypertensive heart disease with heart failure: Secondary | ICD-10-CM | POA: Diagnosis not present

## 2017-10-02 DIAGNOSIS — J449 Chronic obstructive pulmonary disease, unspecified: Secondary | ICD-10-CM | POA: Insufficient documentation

## 2017-10-02 DIAGNOSIS — I5032 Chronic diastolic (congestive) heart failure: Secondary | ICD-10-CM | POA: Insufficient documentation

## 2017-10-02 DIAGNOSIS — Z79899 Other long term (current) drug therapy: Secondary | ICD-10-CM | POA: Insufficient documentation

## 2017-10-02 DIAGNOSIS — T148XXA Other injury of unspecified body region, initial encounter: Secondary | ICD-10-CM

## 2017-10-02 LAB — CBC WITH DIFFERENTIAL/PLATELET
Basophils Absolute: 0.1 10*3/uL (ref 0.0–0.1)
Basophils Relative: 1 %
Eosinophils Absolute: 0.1 10*3/uL (ref 0.0–0.7)
Eosinophils Relative: 2 %
HCT: 41.5 % (ref 39.0–52.0)
Hemoglobin: 13.3 g/dL (ref 13.0–17.0)
Lymphocytes Relative: 11 %
Lymphs Abs: 1 10*3/uL (ref 0.7–4.0)
MCH: 32.4 pg (ref 26.0–34.0)
MCHC: 32 g/dL (ref 30.0–36.0)
MCV: 101.2 fL — ABNORMAL HIGH (ref 78.0–100.0)
Monocytes Absolute: 1.2 10*3/uL — ABNORMAL HIGH (ref 0.1–1.0)
Monocytes Relative: 14 %
Neutro Abs: 6.2 10*3/uL (ref 1.7–7.7)
Neutrophils Relative %: 72 %
Platelets: 283 10*3/uL (ref 150–400)
RBC: 4.1 MIL/uL — ABNORMAL LOW (ref 4.22–5.81)
RDW: 15.6 % — ABNORMAL HIGH (ref 11.5–15.5)
WBC: 8.6 10*3/uL (ref 4.0–10.5)

## 2017-10-02 LAB — COMPREHENSIVE METABOLIC PANEL
ALBUMIN: 3 g/dL — AB (ref 3.5–5.0)
ALT: 73 U/L — AB (ref 17–63)
AST: 120 U/L — AB (ref 15–41)
Alkaline Phosphatase: 660 U/L — ABNORMAL HIGH (ref 38–126)
Anion gap: 13 (ref 5–15)
BILIRUBIN TOTAL: 2.3 mg/dL — AB (ref 0.3–1.2)
BUN: 31 mg/dL — AB (ref 6–20)
CHLORIDE: 101 mmol/L (ref 101–111)
CO2: 29 mmol/L (ref 22–32)
CREATININE: 1.01 mg/dL (ref 0.61–1.24)
Calcium: 9.9 mg/dL (ref 8.9–10.3)
GFR calc Af Amer: >60 mL/min (ref >60)
GLUCOSE: 134 mg/dL — AB (ref 65–99)
POTASSIUM: 4.8 mmol/L (ref 3.5–5.1)
Sodium: 143 mmol/L (ref 135–145)
Total Protein: 7 g/dL (ref 6.5–8.1)

## 2017-10-02 LAB — URINALYSIS, ROUTINE W REFLEX MICROSCOPIC
Bilirubin Urine: NEGATIVE
Glucose, UA: NEGATIVE mg/dL
Ketones, ur: NEGATIVE mg/dL
Nitrite: POSITIVE — AB
Protein, ur: NEGATIVE mg/dL
Specific Gravity, Urine: 1.012 (ref 1.005–1.030)
pH: 5 (ref 5.0–8.0)

## 2017-10-02 LAB — LACTIC ACID, PLASMA
LACTIC ACID, VENOUS: 1.2 mmol/L (ref 0.5–1.9)
Lactic Acid, Venous: 1.2 mmol/L (ref 0.5–1.9)

## 2017-10-02 MED ORDER — IOPAMIDOL (ISOVUE-300) INJECTION 61%
100.0000 mL | Freq: Once | INTRAVENOUS | Status: AC | PRN
  Administered 2017-10-02: 100 mL via INTRAVENOUS

## 2017-10-02 MED ORDER — CIPROFLOXACIN HCL 250 MG PO TABS
500.0000 mg | ORAL_TABLET | Freq: Once | ORAL | Status: AC
  Administered 2017-10-02: 500 mg via ORAL
  Filled 2017-10-02: qty 2

## 2017-10-02 MED ORDER — CIPROFLOXACIN HCL 500 MG PO TABS: 500.0000 mg | ORAL_TABLET | Freq: Two times a day (BID) | ORAL | 0 refills | Status: DC

## 2017-10-02 NOTE — ED Triage Notes (Signed)
Patient has herniated navel that appeared after having liver surgery in August. Patient states hernia started leaking clear-yellow fluid today. Small wound noted to left side of hernia. No bleeding, wound actively draining purulent drainage. Denies any fevers or pain.

## 2017-10-02 NOTE — ED Provider Notes (Signed)
[facility] EMERGENCY DEPARTMENT Provider Note   CSN: [id number] Arrival date & time: 10/02/17  1631     History   Chief Complaint Chief Complaint  Patient presents with  . Wound Check    HPI [name] is a 57 y.o. male.   Wound Check     Pt was seen at 1750. Per pt and his family, c/o sudden onset and persistence of constant "draining fluid from his umbilical hernia" that began to today approximately 1530. Pt's family states pt woke up from a nap and noticed the drainage. Pt has had this umbilical hernia for the past 5 months. Pt's family states pt is not a candidate for surgical repair of the hernia. Pt's family states pt also "picks at the hernia" very frequently and they "have to keep telling him not to do that." Denies bleeding from hernia. Denies pain at hernia site. Denies injury. Denies abd pain, no N/V/D, no fevers, no rash, no CP/SOB.    Past Medical History:  Diagnosis Date  . Alcohol use   . Anemia   . Aortic stenosis    mild  . Arthritis   . Atrial flutter ([place])   . Benign hypertrophy of prostate    with urinary retention; repaired hypospadia; transurethral resection of the prostate   . Cardiac conduction disorder    status post pacemaker implantation in 1995 generator replaced 2005  . Chest pain 2007, 2009   cardiac cath > normal coronary arterie; nl left ventricular function  . CHF (congestive heart failure) ([place])   . Cirrhosis ([place]) 11/2016  . Congenital deafness   . COPD (chronic obstructive pulmonary disease) ([place])   . Diabetes mellitus    +Insulin  . Dyspnea   . Edema   . GERD (gastroesophageal reflux disease)    status post multiple dilatations for stricture  . 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 Hepatitis    Thinks it was B  . History of kidney stones   . HOH (hard of hearing)   . HTN (hypertension)   . Hyperlipidemia    statin discontinued due to abn LFTs  .  Hypothyroidism   . Mitral regurgitation    insignificant  . Obesity   . Obesity 4/08   BMI= 40  . Pelvic mass    identified in 2009, stable 2010  . PONV (postoperative nausea and vomiting)   . Presence of permanent cardiac pacemaker   . Sleep apnea    BiPAP and continuous oxygen  . Syncope   . Thyroid disease     Patient Active Problem List   Diagnosis Date Noted  . S/P TIPS (transjugular intrahepatic portosystemic shunt)   . Transaminitis   . Hepatic encephalopathy ([place]) 04/19/2017  . Decompensated liver disease ([place]) 04/19/2017  . History of head injury 01/27/2017  . Malnutrition of moderate degree 12/24/2016  . Chronic respiratory failure with hypoxia ([place]) 12/24/2016  . COPD (chronic obstructive pulmonary disease) ([place]) 12/23/2016  . Liver cirrhosis secondary to NASH ([place]) 12/14/2016  . Anemia 11/11/2016  . AKI (acute kidney injury) ([place]) 11/11/2016  . Hyperkalemia 11/11/2016  . Hypothyroidism 07/15/2016  . Elevated alkaline phosphatase level 06/14/2016  . Elevated LFTs 06/14/2016  . Ascites   . Diarrhea 02/29/2016  . Abnormal liver function 02/29/2016  . Atrial flutter ([place]) 02/29/2016  . Hypoglycemia 02/29/2016  . Abdominal pain 02/29/2016  . Jejunitis   . Adenomatous colon polyp 09/01/2015  .  Portal hypertensive gastropathy ([place])   . Heme + stool 06/02/2015  . Hematuria 10/26/2013  . HTN (hypertension)   . Atrial fibrillation ([place]) 10/19/2013  . Chronic diastolic heart failure ([place]) 09/19/2013  . Chest pain   . Aortic stenosis   . Hypertension   . Hyperlipidemia   . Pelvic mass   . Cirrhosis of liver with ascites ([place])   . Benign hypertrophy of prostate   . Sleep apnea   . GASTROESOPHAGEAL REFLUX DISEASE 05/19/2010  . Presence of permanent cardiac pacemaker 03/18/2010  . Type 2 diabetes mellitus ([place]) 06/27/2008  . ANEMIA, MILD 06/27/2008  . Deafness congenital 06/27/2008  . CONDUCTION DISORDER OF THE HEART 06/27/2008    Past Surgical History:    Procedure Laterality Date  . A-V CARDIAC PACEMAKER INSERTION  1995  . COLONOSCOPY  2008  . COLONOSCOPY N/A 06/23/2015   TDD:UKGURKY diverticulosis  . ESOPHAGOGASTRODUODENOSCOPY N/A 06/23/2015   HCW:CBJS portal gastropathy  . IR GENERIC HISTORICAL  12/14/2016   IR PARACENTESIS 12/14/2016 Sandi Mariscal, MD [facility]-INTERV RAD  . IR GENERIC HISTORICAL  12/14/2016   IR TIPS 12/14/2016 Sandi Mariscal, MD [facility]  . IR PARACENTESIS  01/12/2017  . IR RADIOLOGIST EVAL & MGMT  01/06/2017  . IR RADIOLOGIST EVAL & MGMT  02/08/2017  . IR RADIOLOGIST EVAL & MGMT  11/30/2016  . IR RADIOLOGIST EVAL & MGMT  09/22/2017  . IR TIPS  01/12/2017  . NASAL SINUS SURGERY     partially removed  . ORCHIECTOMY  1990   bilateral; ?neoplasm  . PACEMAKER INSERTION  05/2004; 10/04/2013   MDT dual chamber pacemaker; gen change 10/04/2013 (MDT ADDRL1)  . PERMANENT PACEMAKER GENERATOR CHANGE N/A 10/04/2013   Procedure: PERMANENT PACEMAKER GENERATOR CHANGE;  Surgeon: Evans Lance, MD;  Location: [facility] CATH LAB;  Service: Cardiovascular;  Laterality: N/A;  . RADIOLOGY WITH ANESTHESIA N/A 12/14/2016   Procedure: TIPS;  Surgeon: Sandi Mariscal, MD;  Location: [place];  Service: Radiology;  Laterality: N/A;  . RADIOLOGY WITH ANESTHESIA N/A 01/12/2017   Procedure: TIPS;  Surgeon: Sandi Mariscal, MD;  Location: [place];  Service: Radiology;  Laterality: N/A;  . REPAIR HYPOSPADIAS W/ URETHROPLASTY    . TONSILLECTOMY AND ADENOIDECTOMY    . TRANSURETHRAL RESECTION OF PROSTATE     for BPH       Home Medications    Prior to Admission medications   Medication Sig Start Date End Date Taking? Authorizing Provider  acetaminophen (TYLENOL) 325 MG tablet Take 325 mg by mouth every 6 (six) hours as needed for mild pain or headache.    Yes [provider]  albuterol (PROVENTIL HFA;VENTOLIN HFA) 108 (90 Base) MCG/ACT inhaler Inhale 2 puffs into the lungs every 6 (six) hours as needed for wheezing or shortness of breath.   Yes [provider]   albuterol (PROVENTIL) (2.5 MG/3ML) 0.083% nebulizer solution Take 2.5 mg by nebulization every 6 (six) hours as needed for wheezing or shortness of breath.   Yes [provider]  Calcium Carb-Cholecalciferol (CALCIUM 600+D) 600-800 MG-UNIT TABS Take 1 tablet by mouth every evening.    Yes [provider]  cetirizine (ZYRTEC) 10 MG tablet Take 10 mg by mouth daily.   Yes [provider]  feeding supplement, ENSURE ENLIVE, (ENSURE ENLIVE) LIQD Take 237 mLs by mouth 3 (three) times daily between meals. 04/19/17  Yes Dhungel, Nishant, MD  furosemide (LASIX) 20 MG tablet Take 1 tablet (20 mg total) daily by mouth. 08/04/17  Yes Branch, Alphonse Guild,  MD  gentamicin ointment (GARAMYCIN) 0.1 % Apply 1 application topically at bedtime as needed (for nose bleeds).    Yes [provider]  Glucosamine-Chondroit-Vit C-Mn (GLUCOSAMINE CHONDR 1500 COMPLX PO) Take 1 tablet by mouth daily.   Yes [provider]  HUMALOG MIX 75/25 KWIKPEN (75-25) 100 UNIT/ML Kwikpen Inject 8 Units into the skin 2 (two) times daily. 15 units in the morning and 15 units in the evening Patient taking differently: Inject 2-8 Units into the skin See admin instructions. Inject 8 units SQ in the morning unless BS 100 or below only inject 4 units SQ, inject 4 units SQ at supper time unless BS 100 or below only inject 2 units SQ 11/14/16  Yes Eber Jones, MD  lactulose (CHRONULAC) 10 GM/15ML solution 10cc (2 teaspoons) three times daily; Titrate to 2-3 semi formed stools a day Patient taking differently: Take 6.6 g by mouth 2 (two) times daily.  05/25/17  Yes Annitta Needs, NP  levothyroxine (LEVOXYL) 50 MCG tablet Take 50 mcg by mouth daily before breakfast.    Yes [provider]  metoprolol succinate (TOPROL-XL) 25 MG 24 hr tablet Take 1 tablet (25 mg total) daily by mouth. 08/04/17  Yes Branch, Alphonse Guild, MD  modafinil (PROVIGIL) 200 MG tablet Take 200 mg by mouth daily.  01/13/15   Yes [provider]  mometasone (NASONEX) 50 MCG/ACT nasal spray Place 2 sprays into the nose daily as needed (for congestion/allergies).    Yes [provider]  Multiple Vitamins-Minerals (CENTRUM) tablet Take 1 tablet by mouth every evening.    Yes [provider]  nitroGLYCERIN (NITROSTAT) 0.4 MG SL tablet Place 1 tablet (0.4 mg total) under the tongue every 5 (five) minutes as needed for chest pain. 06/26/15  Yes Lendon Colonel, NP  omeprazole (PRILOSEC) 40 MG capsule TAKE 1 CAPSULE BY MOUTH ONCE DAILY FOR REFLUX. 09/06/17  Yes Branch, Alphonse Guild, MD  spironolactone (ALDACTONE) 50 MG tablet Take 1 tablet (50 mg total) daily by mouth. 08/04/17  Yes Branch, Alphonse Guild, MD  ursodiol (ACTIGALL) 500 MG tablet TAKE 1 TABLET BY MOUTH TWICE DAILY 08/09/17  Yes Annitta Needs, NP  XIFAXAN 550 MG TABS tablet TAKE 1 TABLET BY MOUTH TWICE DAILY 08/31/17  Yes Annitta Needs, NP  NON FORMULARY Bipap 16.0/e    [provider]  Tetrahydrozoline HCl (VISINE OP) Place 2 drops into both eyes 2 (two) times daily as needed (for allergies).    [provider]    Family History Family History  Problem Relation Age of Onset  . COPD Mother   . Hypertension Mother   . Congestive Heart Failure Mother   . Pneumonia Father   . Heart disease Other   . Hypertension Other   . Colon cancer Neg Hx     Social History Social History   Tobacco Use  . Smoking status: Never Smoker  . Smokeless tobacco: Never Used  . Tobacco comment: tobacco use- no   Substance Use Topics  . Alcohol use: No    Alcohol/week: 0.0 oz    Comment: Hx. of alcohol use  . Drug use: No     Allergies   Penicillins; Enalapril; Metformin and related; and Eliquis [apixaban]   Review of Systems Review of Systems ROS: Statement: All systems negative except as marked or noted in the HPI; Constitutional: Negative for fever and chills. ; ; Eyes: Negative for eye pain, redness and discharge. ; ;  ENMT: Negative for  ear pain, hoarseness, nasal congestion, sinus pressure and sore throat. ; ; Cardiovascular: Negative for chest pain, palpitations, diaphoresis, dyspnea and peripheral edema. ; ; Respiratory: Negative for cough, wheezing and stridor. ; ; Gastrointestinal: +draining fluid from umbilical hernia. Negative for nausea, vomiting, diarrhea, abdominal pain, blood in stool, hematemesis, jaundice and rectal bleeding. . ; ; Genitourinary: Negative for dysuria, flank pain and hematuria. ; ; Musculoskeletal: Negative for back pain and neck pain. Negative for swelling and trauma.; ; Skin: Negative for pruritus, rash, abrasions, blisters, bruising and skin lesion.; ; Neuro: Negative for headache, lightheadedness and neck stiffness. Negative for weakness, altered level of consciousness, altered mental status, extremity weakness, paresthesias, involuntary movement, seizure and syncope.     Physical Exam Updated Vital Signs BP 119/81 (BP Location: Right Arm)   Pulse 86   Temp 98.1 F (36.7 C) (Oral)   Resp 16   Ht 5\' 3"  (1.6 m)   Wt 68.9 kg (152 lb)   SpO2 96%   BMI 26.93 kg/m    BP 136/90   Pulse 88   Temp 98.1 F (36.7 C) (Oral)   Resp 16   Ht 5\' 3"  (1.6 m)   Wt 68.9 kg (152 lb)   SpO2 99%   BMI 26.93 kg/m    Physical Exam 1755: Physical examination:  Nursing notes reviewed; Vital signs and O2 SAT reviewed;  Constitutional: Well developed, Well nourished, Well hydrated, In no acute distress; Head:  Normocephalic, atraumatic; Eyes: EOMI, PERRL, No scleral icterus; ENMT: Mouth and pharynx normal, Mucous membranes moist; Neck: Supple, Full range of motion, No lymphadenopathy; Cardiovascular: Regular rate and rhythm, No gallop; Respiratory: Breath sounds clear & equal bilaterally, No wheezes.  Speaking full sentences with ease, Normal respiratory effort/excursion; Chest: Nontender, Movement normal; Abdomen: Soft, Nontender, Nondistended, Normal bowel sounds. +easily reducible umbilical  hernia, small amount sanguinous fluid drainage. No bleeding, no purulent fluid, no overlying erythema or ecchymosis. No obvious open wound.; Genitourinary: No CVA tenderness; Extremities: Pulses normal, No tenderness, No edema, No calf edema or asymmetry.; Neuro: AA&Ox3, +HOH, otherwise major CN grossly intact.  Speech clear. No gross focal motor deficits in extremities.; Skin: Color normal, Warm, Dry.   ED Treatments / Results  Labs (all labs ordered are listed, but only abnormal results are displayed)   EKG  EKG Interpretation None       Radiology   Procedures Procedures (including critical care time)  Medications Ordered in ED Medications - No data to display   Initial Impression / Assessment and Plan / ED Course  I have reviewed the triage vital signs and the nursing notes.  Pertinent labs & imaging results that were available during my care of the patient were reviewed by me and considered in my medical decision making (see chart for details).  MDM Reviewed: previous chart, nursing note and vitals Reviewed previous: labs Interpretation: labs and CT scan    Results for orders placed or performed during the hospital encounter of 10/02/17  Comprehensive metabolic panel  Result Value Ref Range   Sodium 143 135 - 145 mmol/L   Potassium 4.8 3.5 - 5.1 mmol/L   Chloride 101 101 - 111 mmol/L   CO2 29 22 - 32 mmol/L   Glucose, Bld 134 (H) 65 - 99 mg/dL   BUN 31 (H) 6 - 20 mg/dL   Creatinine, Ser 1.01 0.61 - 1.24 mg/dL   Calcium 9.9 8.9 - 10.3 mg/dL   Total Protein 7.0 6.5 - 8.1 g/dL   Albumin 3.0 (  L) 3.5 - 5.0 g/dL   AST 120 (H) 15 - 41 U/L   ALT 73 (H) 17 - 63 U/L   Alkaline Phosphatase 660 (H) 38 - 126 U/L   Total Bilirubin 2.3 (H) 0.3 - 1.2 mg/dL   GFR calc non Af Amer >60 >60 mL/min   GFR calc Af Amer >60 >60 mL/min   Anion gap 13 5 - 15  CBC with Differential  Result Value Ref Range   WBC 8.6 4.0 - 10.5 K/uL   RBC 4.10 (L) 4.22 - 5.81 MIL/uL   Hemoglobin  13.3 13.0 - 17.0 g/dL   HCT 41.5 39.0 - 52.0 %   MCV 101.2 (H) 78.0 - 100.0 fL   MCH 32.4 26.0 - 34.0 pg   MCHC 32.0 30.0 - 36.0 g/dL   RDW 15.6 (H) 11.5 - 15.5 %   Platelets 283 150 - 400 K/uL   Neutrophils Relative % 72 %   Neutro Abs 6.2 1.7 - 7.7 K/uL   Lymphocytes Relative 11 %   Lymphs Abs 1.0 0.7 - 4.0 K/uL   Monocytes Relative 14 %   Monocytes Absolute 1.2 (H) 0.1 - 1.0 K/uL   Eosinophils Relative 2 %   Eosinophils Absolute 0.1 0.0 - 0.7 K/uL   Basophils Relative 1 %   Basophils Absolute 0.1 0.0 - 0.1 K/uL  Lactic acid, plasma  Result Value Ref Range   Lactic Acid, Venous 1.2 0.5 - 1.9 mmol/L  Urinalysis, Routine w reflex microscopic  Result Value Ref Range   Color, Urine YELLOW YELLOW   APPearance CLEAR CLEAR   Specific Gravity, Urine 1.012 1.005 - 1.030   pH 5.0 5.0 - 8.0   Glucose, UA NEGATIVE NEGATIVE mg/dL   Hgb urine dipstick SMALL (A) NEGATIVE   Bilirubin Urine NEGATIVE NEGATIVE   Ketones, ur NEGATIVE NEGATIVE mg/dL   Protein, ur NEGATIVE NEGATIVE mg/dL   Nitrite POSITIVE (A) NEGATIVE   Leukocytes, UA TRACE (A) NEGATIVE   RBC / HPF 0-5 0 - 5 RBC/hpf   WBC, UA 6-30 0 - 5 WBC/hpf   Bacteria, UA RARE (A) NONE SEEN   Squamous Epithelial / LPF 0-5 (A) NONE SEEN   Mucus PRESENT     Ct Abdomen Pelvis W Contrast Result Date: 10/02/2017 CLINICAL DATA:  Ventral hernia with leaking fluid at the hernia site. History of cirrhosis. EXAM: CT ABDOMEN AND PELVIS WITH CONTRAST TECHNIQUE: Multidetector CT imaging of the abdomen and pelvis was performed using the standard protocol following bolus administration of intravenous contrast. CONTRAST:  176mL ISOVUE-300 IOPAMIDOL (ISOVUE-300) INJECTION 61% COMPARISON:  07/26/2017 abdominal sonogram and 12/23/2016 CT abdomen/pelvis. FINDINGS: Lower chest: No significant pulmonary nodules or acute consolidative airspace disease. Cardiomegaly. Pacemaker leads are seen terminating in the right atrium and right ventricular apex. Trace  pericardial effusion/thickening. Hepatobiliary: Diffusely irregular liver surface with relative hypertrophy of the left liver lobe, compatible with advanced cirrhosis. TIPS extending from the main portal vein to the right hepatic vein appears well positioned and patent. No liver mass. Normal gallbladder with no radiopaque cholelithiasis. No biliary ductal dilatation. Pancreas: Normal, with no mass or duct dilation. Spleen: Normal size. No mass. Adrenals/Urinary Tract: No discrete adrenal nodules. Stable advanced scarring throughout the right kidney. No hydronephrosis. No renal masses. Normal bladder. Stomach/Bowel: Normal non-distended stomach. Normal caliber small bowel with no small bowel wall thickening. Appendix. Normal large bowel with no diverticulosis, large bowel wall thickening or pericolonic fat stranding. Vascular/Lymphatic: Atherosclerotic nonaneurysmal abdominal aorta. Patent portal, splenic, hepatic and renal  veins. No pathologically enlarged lymph nodes in the abdomen or pelvis. Reproductive: Normal size prostate. Other: No pneumoperitoneum. Moderate volume ascites. Small to moderate periumbilical hernia contains ascitic fluid without appreciable bowel loops. No focal fluid collection. Musculoskeletal: No aggressive appearing focal osseous lesions. Mild thoracolumbar spondylosis. Mild anasarca. IMPRESSION: 1. Small to moderate periumbilical hernia contains ascitic fluid without appreciable bowel loops. 2. Advanced cirrhosis.  No liver mass. 3. Moderate volume ascites. 4. Cardiomegaly.  Trace pericardial effusion/thickening. 5.  Aortic Atherosclerosis (ICD10-I70.0). Electronically Signed   By: Ilona Sorrel M.D.   On: 10/02/2017 20:22     Results for HUSTON, STONEHOCKER (MRN [id number]) as of 10/02/2017 19:21  Ref. Range 04/18/2017 06:30 04/19/2017 06:27 04/21/2017 05:45 05/25/2017 14:49 10/02/2017 18:06  AST Latest Ref Range: 15 - 41 U/L 219 (H) 169 (H) 117 (H) 86 (H) 120 (H)  ALT Latest Ref Range: 17 - 63  U/L 138 (H) 116 (H) 95 (H) 66 (H) 73 (H)    3976:  Umbilical hernia soft, easily reducible. No s/s of infection. Fluid draining appears ascitic, no purulence, no bleeding.  T/C to General Surgery Dr. Arnoldo Morale, case discussed, including:  HPI, pertinent PM/SHx, VS/PE, dx testing, ED course and treatment:  No acute surgical issue at this time, can apply ostomy appliance to catch drainage, can f/u in office prn, also have pt f/u with GI MD.   2110:  +UTI, UC pending; will tx with cipro. No clear indication for admission at this time. Ostomy appliance applied; Home Health consult placed. Dx and testing d/w pt and family.  Questions answered.  Verb understanding, agreeable to d/c home with outpt f/u.   Final Clinical Impressions(s) / ED Diagnoses   Final diagnoses:  None    ED Discharge Orders    None        Francine Graven, DO 10/04/17 1506

## 2017-10-02 NOTE — Discharge Instructions (Signed)
Take the prescription as directed.  The ostomy appliance should help catch the drainage from your hernia. Home Health should contact you tomorrow regarding further education/dressing changes.  Call your regular medical doctor tomorrow to schedule a follow up appointment within the next 2 days. Call your GI doctor and your General Surgeon tomorrow to schedule a follow up appointment this week.  Return to the Emergency Department immediately sooner if worsening.

## 2017-10-02 NOTE — ED Notes (Signed)
Ostomy bag placed on draining hernia

## 2017-10-02 NOTE — ED Notes (Signed)
Have dressed pt.'s leaking wound x2

## 2017-10-03 ENCOUNTER — Telehealth: Payer: Self-pay

## 2017-10-03 ENCOUNTER — Telehealth: Payer: Self-pay | Admitting: Internal Medicine

## 2017-10-03 NOTE — Telephone Encounter (Signed)
PA started for Xifaxan 550mg , has been approved through covermymeds.[email or web address]. Approval is good through 09/30/17-10/01/2019. Letter sent to scan in pts chart.

## 2017-10-03 NOTE — Telephone Encounter (Signed)
PATIENT WILL CALL HIS SURGEON TO FOLLOW UP AS WELL, HAD NOT DONE THAT AS OF YET, ADDED HIM TO A CANCELLATION LIST FOR HIS OV HERE

## 2017-10-03 NOTE — Telephone Encounter (Signed)
Pt's wife, Olin Hauser, called to say the patient was seen in the ER for his hernia repair leaking. She was told he needed to follow up with RMR in one week. Patient has OV already scheduled in February. Do we need to bring him in this week or shouldn't he see his surgeon first? Please advise and call [phone]

## 2017-10-03 NOTE — Telephone Encounter (Signed)
Spoke with spouse, pts discharge papers ask for pt to f/u with RMR in one week. Please schedule an appointment with RMR for the week of 10/09/17.

## 2017-10-03 NOTE — Telephone Encounter (Signed)
Spouse called back saying an office needed a referral to f/u with her spouse and his hernia. Read the discharge summary with pts spouse and she is to call General Surgery for that f/u. Pts spouse understands and will call General Surgery.

## 2017-10-03 NOTE — ED Provider Notes (Signed)
Phone call from pharmacist and Milus Glazier.  He stated patient has a Cipro allergy.  Will change Rx to doxycycline 100 mg twice daily for 10 days.   Nat Christen, MD 10/03/17 1049

## 2017-10-05 LAB — URINE CULTURE: Culture: >=100000 — AB

## 2017-10-05 NOTE — Telephone Encounter (Signed)
Spoke with the pharmacist. Medication was approved pt cost is still high.

## 2017-10-05 NOTE — Telephone Encounter (Signed)
Patient called and stated that the patient's Xifaxan has a $375 copayment.  Routing to [place] for follow-up.

## 2017-10-05 NOTE — Telephone Encounter (Signed)
Spouse notified, will look into getting assistance.

## 2017-10-06 ENCOUNTER — Telehealth: Payer: Self-pay | Admitting: *Deleted

## 2017-10-06 NOTE — Telephone Encounter (Signed)
Post ED Visit - Positive Culture Follow-up  Culture report reviewed by antimicrobial stewardship pharmacist:  []  Elenor Quinones, Pharm.D. []  Heide Guile, Pharm.D., BCPS AQ-ID []  Parks Neptune, Pharm.D., BCPS []  Alycia Rossetti, Pharm.D., BCPS []  Fowler, Pharm.D., BCPS, AAHIVP []  Legrand Como, Pharm.D., BCPS, AAHIVP []  Salome Arnt, PharmD, BCPS []  Jalene Mullet, PharmD []  Vincenza Hews, PharmD, BCPS Jalene Mullet, PharmD  Positive urine culture Treated with Ciprofloxacin HCL, organism sensitive to the same and no further patient follow-up is required at this time.  [name], [name] [facility] 10/06/2017, 10:52 AM

## 2017-10-10 ENCOUNTER — Other Ambulatory Visit (HOSPITAL_COMMUNITY): Payer: Self-pay | Admitting: Interventional Radiology

## 2017-10-10 DIAGNOSIS — C642 Malignant neoplasm of left kidney, except renal pelvis: Secondary | ICD-10-CM

## 2017-10-10 NOTE — Telephone Encounter (Signed)
Spoke with spouse about signing papers for pt assistance form. Pt will stop by Thursday and I will help them fill out paperwork.

## 2017-10-12 ENCOUNTER — Emergency Department (HOSPITAL_COMMUNITY)
Admission: EM | Admit: 2017-10-12 | Discharge: 2017-10-12 | Disposition: A | Discharge status: Home / Self Care | Payer: Medicare HMO | Attending: Emergency Medicine | Admitting: Emergency Medicine

## 2017-10-12 ENCOUNTER — Telehealth: Payer: Self-pay | Admitting: Internal Medicine

## 2017-10-12 ENCOUNTER — Encounter (HOSPITAL_COMMUNITY): Payer: Self-pay | Admitting: Emergency Medicine

## 2017-10-12 ENCOUNTER — Other Ambulatory Visit: Payer: Self-pay

## 2017-10-12 DIAGNOSIS — E039 Hypothyroidism, unspecified: Secondary | ICD-10-CM | POA: Diagnosis not present

## 2017-10-12 DIAGNOSIS — R188 Other ascites: Secondary | ICD-10-CM | POA: Diagnosis not present

## 2017-10-12 DIAGNOSIS — J449 Chronic obstructive pulmonary disease, unspecified: Secondary | ICD-10-CM | POA: Diagnosis not present

## 2017-10-12 DIAGNOSIS — E119 Type 2 diabetes mellitus without complications: Secondary | ICD-10-CM | POA: Diagnosis not present

## 2017-10-12 DIAGNOSIS — Z794 Long term (current) use of insulin: Secondary | ICD-10-CM | POA: Diagnosis not present

## 2017-10-12 DIAGNOSIS — K429 Umbilical hernia without obstruction or gangrene: Secondary | ICD-10-CM

## 2017-10-12 DIAGNOSIS — I110 Hypertensive heart disease with heart failure: Secondary | ICD-10-CM | POA: Insufficient documentation

## 2017-10-12 DIAGNOSIS — Z79899 Other long term (current) drug therapy: Secondary | ICD-10-CM | POA: Diagnosis not present

## 2017-10-12 DIAGNOSIS — I509 Heart failure, unspecified: Secondary | ICD-10-CM | POA: Diagnosis not present

## 2017-10-12 DIAGNOSIS — Z950 Presence of cardiac pacemaker: Secondary | ICD-10-CM | POA: Insufficient documentation

## 2017-10-12 NOTE — ED Notes (Signed)
Ostomy bag placed.  Patient teaching with [place]. Patient spouse at bedside.  All questions and concerns addressed.

## 2017-10-12 NOTE — ED Provider Notes (Signed)
[facility] EMERGENCY DEPARTMENT Provider Note   CSN: [id number] Arrival date & time: 10/12/17  1059     History   Chief Complaint Chief Complaint  Patient presents with  . Urinary Incontinence    HPI [name] is a 57 y.o. male.  HPI   57 year old male with leaking umbilical hernia.  He has a past history of cirrhosis.  He has had recurrent drainage from his hernia.  Ostomy bag has been recently placed but subsequently came off and they do not have additional supplies.  No acute complaints otherwise.  Past Medical History:  Diagnosis Date  . Alcohol use   . Anemia   . Aortic stenosis    mild  . Arthritis   . Atrial flutter ([place])   . Benign hypertrophy of prostate    with urinary retention; repaired hypospadia; transurethral resection of the prostate   . Cardiac conduction disorder    status post pacemaker implantation in 1995 generator replaced 2005  . Chest pain 2007, 2009   cardiac cath > normal coronary arterie; nl left ventricular function  . CHF (congestive heart failure) ([place])   . Cirrhosis ([place]) 11/2016  . Congenital deafness   . COPD (chronic obstructive pulmonary disease) ([place])   . Diabetes mellitus    +Insulin  . Dyspnea   . Edema   . GERD (gastroesophageal reflux disease)    status post multiple dilatations for stricture  . 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 Hepatitis    Thinks it was B  . History of kidney stones   . HOH (hard of hearing)   . HTN (hypertension)   . Hyperlipidemia    statin discontinued due to abn LFTs  . Hypothyroidism   . Mitral regurgitation    insignificant  . Obesity   . Obesity 4/08   BMI= 40  . Pelvic mass    identified in 2009, stable 2010  . PONV (postoperative nausea and vomiting)   . Presence of permanent cardiac pacemaker   . Sleep apnea    BiPAP and continuous oxygen  . Syncope   . Thyroid disease     Patient Active Problem List   Diagnosis Date Noted  . S/P TIPS (transjugular intrahepatic portosystemic shunt)   . Transaminitis   . Hepatic encephalopathy ([place]) 04/19/2017  . Decompensated liver disease ([place]) 04/19/2017  . History of head injury 01/27/2017  . Malnutrition of moderate degree 12/24/2016  . Chronic respiratory failure with hypoxia ([place]) 12/24/2016  . COPD (chronic obstructive pulmonary disease) ([place]) 12/23/2016  . 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 Anemia 11/11/2016  . AKI (acute kidney injury) ([place]) 11/11/2016  . Hyperkalemia 11/11/2016  . Hypothyroidism 07/15/2016  . Elevated alkaline phosphatase level 06/14/2016  . Elevated LFTs 06/14/2016  . Ascites   . Diarrhea 02/29/2016  . Abnormal liver function 02/29/2016  . Atrial flutter ([place]) 02/29/2016  . Hypoglycemia 02/29/2016  . Abdominal pain 02/29/2016  . Jejunitis   . Adenomatous colon polyp 09/01/2015  . Portal hypertensive gastropathy ([place])   . Heme + stool 06/02/2015  . Hematuria 10/26/2013  . HTN (hypertension)   . Atrial fibrillation ([place]) 10/19/2013  . Chronic diastolic heart failure ([place]) 09/19/2013  . Chest pain   . Aortic stenosis   . Hypertension   . Hyperlipidemia   . Pelvic mass   . Cirrhosis of liver with ascites ([place])   . Benign hypertrophy of prostate   .  Sleep apnea   . GASTROESOPHAGEAL REFLUX DISEASE 05/19/2010  . Presence of permanent cardiac pacemaker 03/18/2010  . Type 2 diabetes mellitus ([place]) 06/27/2008  . ANEMIA, MILD 06/27/2008  . Deafness congenital 06/27/2008  . CONDUCTION DISORDER OF THE HEART 06/27/2008    Past Surgical History:  Procedure Laterality Date  . A-V CARDIAC PACEMAKER INSERTION  1995  . COLONOSCOPY  2008  . COLONOSCOPY N/A 06/23/2015   [id number] diverticulosis  . ESOPHAGOGASTRODUODENOSCOPY N/A 06/23/2015   UJW:[id number] portal gastropathy  . IR GENERIC HISTORICAL  12/14/2016   IR PARACENTESIS 12/14/2016 Sandi Mariscal, MD [facility]-INTERV RAD  . IR GENERIC HISTORICAL  12/14/2016   IR  TIPS 12/14/2016 Sandi Mariscal, MD [facility]  . IR PARACENTESIS  01/12/2017  . IR RADIOLOGIST EVAL & MGMT  01/06/2017  . IR RADIOLOGIST EVAL & MGMT  02/08/2017  . IR RADIOLOGIST EVAL & MGMT  11/30/2016  . IR RADIOLOGIST EVAL & MGMT  09/22/2017  . IR TIPS  01/12/2017  . NASAL SINUS SURGERY     partially removed  . ORCHIECTOMY  1990   bilateral; ?neoplasm  . PACEMAKER INSERTION  05/2004; 10/04/2013   MDT dual chamber pacemaker; gen change 10/04/2013 (MDT ADDRL1)  . PERMANENT PACEMAKER GENERATOR CHANGE N/A 10/04/2013   Procedure: PERMANENT PACEMAKER GENERATOR CHANGE;  Surgeon: Evans Lance, MD;  Location: [facility] CATH LAB;  Service: Cardiovascular;  Laterality: N/A;  . RADIOLOGY WITH ANESTHESIA N/A 12/14/2016   Procedure: TIPS;  Surgeon: Sandi Mariscal, MD;  Location: [place];  Service: Radiology;  Laterality: N/A;  . RADIOLOGY WITH ANESTHESIA N/A 01/12/2017   Procedure: TIPS;  Surgeon: Sandi Mariscal, MD;  Location: [place];  Service: Radiology;  Laterality: N/A;  . REPAIR HYPOSPADIAS W/ URETHROPLASTY    . TONSILLECTOMY AND ADENOIDECTOMY    . TRANSURETHRAL RESECTION OF PROSTATE     for BPH       Home Medications    Prior to Admission medications   Medication Sig Start Date End Date Taking? Authorizing Provider  acetaminophen (TYLENOL) 325 MG tablet Take 325 mg by mouth every 6 (six) hours as needed for mild pain or headache.     [provider]  albuterol (PROVENTIL HFA;VENTOLIN HFA) 108 (90 Base) MCG/ACT inhaler Inhale 2 puffs into the lungs every 6 (six) hours as needed for wheezing or shortness of breath.    [provider]  albuterol (PROVENTIL) (2.5 MG/3ML) 0.083% nebulizer solution Take 2.5 mg by nebulization every 6 (six) hours as needed for wheezing or shortness of breath.    [provider]  Calcium Carb-Cholecalciferol (CALCIUM 600+D) 600-800 MG-UNIT TABS Take 1 tablet by mouth every evening.     [provider]  cetirizine (ZYRTEC) 10 MG tablet Take 10 mg by mouth  daily.    [provider]  ciprofloxacin (CIPRO) 500 MG tablet Take 1 tablet (500 mg total) by mouth 2 (two) times daily. 10/02/17   Francine Graven, DO  feeding supplement, ENSURE ENLIVE, (ENSURE ENLIVE) LIQD Take 237 mLs by mouth 3 (three) times daily between meals. 04/19/17   Dhungel, Flonnie Overman, MD  furosemide (LASIX) 20 MG tablet Take 1 tablet (20 mg total) daily by mouth. 08/04/17   Branch, Alphonse Guild, MD  gentamicin ointment (GARAMYCIN) 0.1 % Apply 1 application topically at bedtime as needed (for nose bleeds).     [provider]  Glucosamine-Chondroit-Vit C-Mn (GLUCOSAMINE CHONDR 1500 COMPLX PO) Take 1 tablet by mouth daily.    [provider]  HUMALOG MIX 75/25 KWIKPEN (75-25) 100  UNIT/ML Kwikpen Inject 8 Units into the skin 2 (two) times daily. 15 units in the morning and 15 units in the evening Patient taking differently: Inject 2-8 Units into the skin See admin instructions. Inject 8 units SQ in the morning unless BS 100 or below only inject 4 units SQ, inject 4 units SQ at supper time unless BS 100 or below only inject 2 units SQ 11/14/16   Eber Jones, MD  lactulose [facility]) 10 GM/15ML solution 10cc (2 teaspoons) three times daily; Titrate to 2-3 semi formed stools a day Patient taking differently: Take 6.6 g by mouth 2 (two) times daily.  05/25/17   Annitta Needs, NP  levothyroxine (LEVOXYL) 50 MCG tablet Take 50 mcg by mouth daily before breakfast.     [provider]  metoprolol succinate (TOPROL-XL) 25 MG 24 hr tablet Take 1 tablet (25 mg total) daily by mouth. 08/04/17   Branch, Alphonse Guild, MD  modafinil (PROVIGIL) 200 MG tablet Take 200 mg by mouth daily.  01/13/15   [provider]  mometasone (NASONEX) 50 MCG/ACT nasal spray Place 2 sprays into the nose daily as needed (for congestion/allergies).     [provider]  Multiple Vitamins-Minerals (CENTRUM) tablet Take 1 tablet by mouth every evening.     [provider]  nitroGLYCERIN (NITROSTAT) 0.4 MG SL tablet Place 1 tablet (0.4 mg total) under the tongue every 5 (five) minutes as needed for chest pain. 06/26/15   Lendon Colonel, NP  omeprazole (PRILOSEC) 40 MG capsule TAKE 1 CAPSULE BY MOUTH ONCE DAILY FOR REFLUX. 09/06/17   Arnoldo Lenis, MD  spironolactone (ALDACTONE) 50 MG tablet Take 1 tablet (50 mg total) daily by mouth. 08/04/17   Branch, Alphonse Guild, MD  Tetrahydrozoline HCl (VISINE OP) Place 2 drops into both eyes 2 (two) times daily as needed (for allergies).    [provider]  ursodiol (ACTIGALL) 500 MG tablet TAKE 1 TABLET BY MOUTH TWICE DAILY 08/09/17   Annitta Needs, NP  XIFAXAN 550 MG TABS tablet TAKE 1 TABLET BY MOUTH TWICE DAILY 08/31/17   Annitta Needs, NP    Family History Family History  Problem Relation Age of Onset  . COPD Mother   . Hypertension Mother   . Congestive Heart Failure Mother   . Pneumonia Father   . Heart disease Other   . Hypertension Other   . Colon cancer Neg Hx     Social History Social History   Tobacco Use  . Smoking status: Never Smoker  . Smokeless tobacco: Never Used  . Tobacco comment: tobacco use- no   Substance Use Topics  . Alcohol use: No    Alcohol/week: 0.0 oz    Comment: Hx. of alcohol use  . Drug use: No     Allergies   Penicillins; Enalapril; Metformin and related; and Eliquis [apixaban]   Review of Systems Review of Systems  All systems reviewed and negative, other than as noted in HPI.  Physical Exam Updated Vital Signs BP 114/82 (BP Location: Right Arm)   Pulse 84   Temp 97.9 F (36.6 C) (Oral)   Resp 16   Ht 5\' 3"  (1.6 m)   Wt 68.9 kg (152 lb)   SpO2 92%   BMI 26.93 kg/m   Physical Exam  Constitutional: He appears well-developed and well-nourished. No distress.  HENT:  Head: Normocephalic and atraumatic.  Eyes: Conjunctivae are normal. Right eye exhibits no discharge. Left eye exhibits no discharge.  Neck: Neck supple.    Cardiovascular: Normal rate, regular rhythm and normal heart sounds. Exam reveals no gallop and no friction rub.  No murmur heard. Pulmonary/Chest: Effort normal and breath sounds normal. No respiratory distress.  Abdominal: Soft. He exhibits no distension. There is no tenderness.  Abdomen is soft.  Mild distention.  No tenderness.  Reducible umbilical hernia with a small ulcerated area leaking ascites.  Musculoskeletal: He exhibits no edema or tenderness.  Neurological: He is alert.  Skin: Skin is warm and dry.  Nursing note and vitals reviewed.    ED Treatments / Results  Labs (all labs ordered are listed, but only abnormal results are displayed) Labs Reviewed - No data to display  EKG  EKG Interpretation None       Radiology No results found.  Procedures Procedures (including critical care time)  Medications Ordered in ED Medications - No data to display   Initial Impression / Assessment and Plan / ED Course  I have reviewed the triage vital signs and the nursing notes.  Pertinent labs & imaging results that were available during my care of the patient were reviewed by me and considered in my medical decision making (see chart for details).     Will place ostomy bag over hernia and try to help facilitate further supplies for home.   Final Clinical Impressions(s) / ED Diagnoses   Final diagnoses:  Umbilical hernia without obstruction and without gangrene  Other ascites    ED Discharge Orders    None       Virgel Manifold, MD 10/12/17 1218

## 2017-10-12 NOTE — Care Management Note (Signed)
Case Management Note  Patient Details  Name: [name] MRN: [id number] Date of Birth: 11-12-1960  Subjective/Objective:  Patient from home with wife, state had [facility] set up but never showed up, PCP staff has been working on.  She does not know who he was set up with.                Action/Plan:   Called Dr. Shade Flood office, talked with staff.  They were unable to locate who he was set up with also.  Talked with Pam, wife, states has had [facility] in past and really liked [facility].  Referral made to [facility] - Talked with Blake Divine - accepted case.  Updated wife.  Documented main number on discharge summary.    Expected Discharge Date:                  Expected Discharge Plan:     In-House Referral:     Discharge planning Services     Post Acute Care Choice:    Choice offered to:     DME Arranged:    DME Agency:     HH Arranged: RN   [place] Agency:   [facility]  Status of Service:     If discussed at [employer], dates discussed:    Additional Comments:  Vergie Living, RN 10/12/2017, 1:48 PM

## 2017-10-12 NOTE — Telephone Encounter (Signed)
Lmom, waiting on a return call.  

## 2017-10-12 NOTE — Discharge Instructions (Signed)
Set up with [facility] with a RN - Home office is [phone].  They should call you within 24 hours to set up an appointment to come see you.  If you do not hear from them call the home office number.

## 2017-10-12 NOTE — Telephone Encounter (Signed)
[phone]  PLEASE CALL PATIENT ABOUT PATIENT "BAG" THEY PUT ON HIM IN THE HOSPITAL, WIFE STATED IT HAS COME OFF AND WANTED TO KNOW IF WE COULD FIX THAT HERE.  I TOLD HER TO TAKE HIM TO THE ER.

## 2017-10-12 NOTE — ED Triage Notes (Addendum)
Pt reports seen for same and was supposed to have home health come out and reports "no one has come". Pt reports "a tube" was inserted on Sunday and reports "the tube and all came out". Urinary incontinence noted at time of triage.   At time of rooming, undressing, and repositioning pt in bed, large outpouching of "hernia" noted to periumbilical region. Pinpoint hole noted to side of pouch with urine output.

## 2017-10-12 NOTE — ED Notes (Signed)
EDP at bedside  

## 2017-10-13 ENCOUNTER — Ambulatory Visit: Payer: Medicare HMO | Admitting: General Surgery

## 2017-10-13 ENCOUNTER — Ambulatory Visit: Payer: Medicare Other | Admitting: General Surgery

## 2017-10-13 ENCOUNTER — Encounter: Payer: Self-pay | Admitting: General Surgery

## 2017-10-13 VITALS — BP 107/71 | HR 86 | Temp 97.3°F | Ht 63.0 in | Wt 158.0 lb

## 2017-10-13 DIAGNOSIS — K429 Umbilical hernia without obstruction or gangrene: Secondary | ICD-10-CM | POA: Diagnosis not present

## 2017-10-13 NOTE — Progress Notes (Signed)
Subjective:     [name]  Here for ER follow-up.  He was seen in the emergency room because he had been picking at his umbilical hernia and started having peritoneal fluid leak from it.  He has an extensive history of cirrhosis with ascites.  I have seen him in the past for his umbilical hernia and I told him that he is not a surgical candidate for repair.  The emergency room placed in the ostomy bag to help control the drainage.  Family states that [facility] home health care is going to be seeing the patient. Objective:    BP 107/71   Pulse 86   Temp (!) 97.3 F (36.3 C)   Ht 5\' 3"  (1.6 m)   Wt 158 lb (71.7 kg)   BMI 27.99 kg/m   General:  alert, cooperative and no distress  Abdomen is soft.  An ostomy bag is present over the umbilicus which is not inflamed.  No purulent drainage is noted.  Clear yellow peritoneal fluid is present in the bag.     Assessment:    Cirrhosis, peritoneal fluid leakage from umbilicus, no evidence of peritonitis.    Plan:  I have given the family a prescription for ostomy bags.  This should resolve spontaneously.  No need for surgical intervention at this time.  Follow-up as needed.

## 2017-10-13 NOTE — Telephone Encounter (Signed)
Pts spouse walked in office to sign pt assistance paper. Paper were faxed and waiting to hear back from pt assistance. Pts spouse called yesterday and that problem has been taken care of.

## 2017-10-19 ENCOUNTER — Telehealth: Payer: Self-pay

## 2017-10-19 NOTE — Telephone Encounter (Signed)
Pts spouse was calling to ask if we have heard back from the pt assistance program. Papers were filled out Thursday 10/13/17 and faxed. Pts spouse notified that it can take up to 1 week to hear back from the company. Will call pt back when info comes in.

## 2017-10-25 ENCOUNTER — Telehealth: Payer: Self-pay

## 2017-10-25 NOTE — Telephone Encounter (Signed)
Called to check on the status of Pt Assistance for xifaxan. The representative said it was going to be 15-20 business days for processing time. Pts info hadn't been entered into the system, but the company was going to enter it today.   Company name: [employer] [phone])

## 2017-11-10 ENCOUNTER — Encounter: Payer: Self-pay | Admitting: Gastroenterology

## 2017-11-10 ENCOUNTER — Ambulatory Visit (INDEPENDENT_AMBULATORY_CARE_PROVIDER_SITE_OTHER): Payer: Medicare HMO | Admitting: Gastroenterology

## 2017-11-10 ENCOUNTER — Other Ambulatory Visit: Payer: Self-pay

## 2017-11-10 ENCOUNTER — Telehealth: Payer: Self-pay

## 2017-11-10 VITALS — BP 106/80 | HR 88 | Temp 97.0°F | Ht 63.0 in | Wt 166.6 lb

## 2017-11-10 DIAGNOSIS — K421 Umbilical hernia with gangrene: Secondary | ICD-10-CM | POA: Insufficient documentation

## 2017-11-10 DIAGNOSIS — K429 Umbilical hernia without obstruction or gangrene: Secondary | ICD-10-CM | POA: Diagnosis not present

## 2017-11-10 DIAGNOSIS — K7460 Unspecified cirrhosis of liver: Secondary | ICD-10-CM

## 2017-11-10 DIAGNOSIS — R188 Other ascites: Secondary | ICD-10-CM

## 2017-11-10 NOTE — Assessment & Plan Note (Addendum)
Due EGD for esophageal variceal surveillance. Plan for EGD with possible banding with deep sedation in the near future.  I have discussed the risks, alternatives, benefits with regards to but not limited to the risk of reaction to medication, bleeding, infection, perforation and the patient is agreeable to proceed. Written consent to be obtained.  Hepatoma screening is up to date. His next labs are due in 3 months.  He was having some constipation concerns but notes lactulose works when he takes it. Skips dosages at times due to doctor's appointments etc, advised to double up on dose when he returns for the day. Maintain 2-3 soft BMs daily.

## 2017-11-10 NOTE — Telephone Encounter (Signed)
Spoke with [facility] Pt assistance in reference to PT assistance for pt. Pts application is in its last stages before being approved or denied. If approved pts medication will be shipped to him and our office will receive a fax. Will notify pt at his appointment today.

## 2017-11-10 NOTE — Telephone Encounter (Signed)
Called and informed pt's wife of pre-op appt 12/15/17 at 10:00am. Letter mailed.

## 2017-11-10 NOTE — Assessment & Plan Note (Addendum)
Educated patient to leave his hernia along. Try not to scratch or rub the skin. Consider abd hernia binder to see if this would help deter him from messing with it. Ok to return to school letter provided, he was not allowed to present when he was wearing colostomy bag over the hernia.

## 2017-11-10 NOTE — Progress Notes (Signed)
Primary Care Physician:  Petra Kuba, MD  Primary Gastroenterologist:  Garfield Cornea, MD   Chief Complaint  Patient presents with  . EGD    here to get rescheduled    HPI:  [name] is a 57 y.o. male here to reschedule his EGD. He was canceled last time because he ate a pan of Jell-O the morning of his procedure. Patient's wife states his procedure was moved to later in the day and he was advised to have clear liquids that morning. He has a history of [place] cirrhosis, EtOH use, treated empirically for AMA negative PBC starting approximately November 2017.Underwent TIPS utilizing a gun-sight technique on 01/12/17. Presented with encephalopathy in July 2018 and admitted. Non-compliant with lactulose therapy at that time. TIPS has remained patent. Low dose lasix 20 mg and aldactone 50 mg since TIPS procedure. No need for para since TIPS. Xifaxan patient assistance pending.   Since we last saw him patient has had an ED visit for drainage of peritoneal fluid from his umbilical hernia. Apparently, according to his wife, he picks at his umbilical hernia like it's a toy. He had to wear a colostomy bag over to catch the fluid draining for a couple of weeks. Eventually closed off on its own. He was evaluated by Dr. Arnoldo Morale he did not advise any surgical intervention. He was not allowed to go to school during this time and would like to have a note allowing him to go back at this point. He's had no drainage from his umbilical hernia for two weeks.  Patient complains of some intermittent constipation. Not always compliant with his lactulose especially if he is going out. No melena or rectal bleeding. Appetite generally good. No dysphagia. No abdominal pain. Mental and or rectal bleeding. No mental status changes.  Right upper quadrant ultrasound back in November with no evidence of hepatoma. He had TIPS evaluation last month showed patent TIPS. CT and pelvis with contrast while in the ED last  month showed some ascites. Small moderate. Umbilical hernia containing ascitic fluid. No focal fluid collection.   Current Outpatient Medications  Medication Sig Dispense Refill  . acetaminophen (TYLENOL) 325 MG tablet Take 325 mg by mouth every 6 (six) hours as needed for mild pain or headache.     . albuterol (PROVENTIL HFA;VENTOLIN HFA) 108 (90 Base) MCG/ACT inhaler Inhale 2 puffs into the lungs every 6 (six) hours as needed for wheezing or shortness of breath.    Marland Kitchen albuterol (PROVENTIL) (2.5 MG/3ML) 0.083% nebulizer solution Take 2.5 mg by nebulization every 6 (six) hours as needed for wheezing or shortness of breath.    . Calcium Carb-Cholecalciferol (CALCIUM 600+D) 600-800 MG-UNIT TABS Take 1 tablet by mouth every evening.     . cetirizine (ZYRTEC) 10 MG tablet Take 10 mg by mouth daily.    . feeding supplement, ENSURE ENLIVE, (ENSURE ENLIVE) LIQD Take 237 mLs by mouth 3 (three) times daily between meals. 237 mL 12  . furosemide (LASIX) 20 MG tablet Take 1 tablet (20 mg total) daily by mouth. 90 tablet 3  . gentamicin ointment (GARAMYCIN) 0.1 % Apply 1 application topically at bedtime as needed (for nose bleeds).     Marland Kitchen HUMALOG MIX 75/25 KWIKPEN (75-25) 100 UNIT/ML Kwikpen Inject 8 Units into the skin 2 (two) times daily. 15 units in the morning and 15 units in the evening (Patient taking differently: Inject 2-8 Units into the skin See admin instructions. Inject 4 units SQ in the morning unless  BS 100 or below only inject 4 units SQ, inject 4 units SQ at supper time unless BS 100 or below only inject 2 units SQ) 10 mL 0  . lactulose (CHRONULAC) 10 GM/15ML solution 10cc (2 teaspoons) three times daily; Titrate to 2-3 semi formed stools a day (Patient taking differently: Take 10 g by mouth 2 (two) times daily. ) 1892 mL 5  . levothyroxine (LEVOXYL) 50 MCG tablet Take 50 mcg by mouth daily before breakfast.     . metoprolol succinate (TOPROL-XL) 25 MG 24 hr tablet Take 1 tablet (25 mg total) daily  by mouth. 90 tablet 3  . modafinil (PROVIGIL) 200 MG tablet Take 200 mg by mouth daily.     . mometasone (NASONEX) 50 MCG/ACT nasal spray Place 2 sprays into the nose daily as needed (for congestion/allergies).     . Multiple Vitamins-Minerals (CENTRUM) tablet Take 1 tablet by mouth every evening.     . nitroGLYCERIN (NITROSTAT) 0.4 MG SL tablet Place 1 tablet (0.4 mg total) under the tongue every 5 (five) minutes as needed for chest pain. 25 tablet 3  . omeprazole (PRILOSEC) 40 MG capsule TAKE 1 CAPSULE BY MOUTH ONCE DAILY FOR REFLUX. 30 capsule 0  . spironolactone (ALDACTONE) 50 MG tablet Take 1 tablet (50 mg total) daily by mouth. 90 tablet 3  . Tetrahydrozoline HCl (VISINE OP) Place 2 drops into both eyes 2 (two) times daily as needed (for allergies).    . ursodiol (ACTIGALL) 500 MG tablet TAKE 1 TABLET BY MOUTH TWICE DAILY 60 tablet 3  . XIFAXAN 550 MG TABS tablet TAKE 1 TABLET BY MOUTH TWICE DAILY (Patient not taking: Reported on 11/10/2017) 180 tablet 3   No current facility-administered medications for this visit.     Allergies as of 11/10/2017 - Review Complete 11/10/2017  Allergen Reaction Noted  . Penicillins Swelling and Other (See Comments)   . Enalapril Cough 04/15/2011  . Metformin and related Diarrhea 07/30/2013  . Ciprofloxacin  10/27/2013  . Eliquis [apixaban] Other (See Comments) 03/18/2014    Past Medical History:  Diagnosis Date  . Alcohol use   . Anemia   . Aortic stenosis    mild  . Arthritis   . Atrial flutter ([place])   . Benign hypertrophy of prostate    with urinary retention; repaired hypospadia; transurethral resection of the prostate   . Cardiac conduction disorder    status post pacemaker implantation in 1995 generator replaced 2005  . Chest pain 2007, 2009   cardiac cath > normal coronary arterie; nl left ventricular function  . CHF (congestive heart failure) ([place])   . Cirrhosis ([place]) 11/2016  . Congenital deafness   . COPD (chronic obstructive  pulmonary disease) ([place])   . Diabetes mellitus    +Insulin  . Dyspnea   . Edema   . GERD (gastroesophageal reflux disease)    status post multiple dilatations for stricture  . 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 Hepatitis    Thinks it was B  . History of kidney stones   . HOH (hard of hearing)   . HTN (hypertension)   . Hyperlipidemia    statin discontinued due to abn LFTs  . Hypothyroidism   . Mitral regurgitation    insignificant  . Obesity   . Obesity 4/08   BMI= 40  . Pelvic mass    identified in 2009, stable 2010  . PONV (postoperative nausea and  vomiting)   . Presence of permanent cardiac pacemaker   . Sleep apnea    BiPAP and continuous oxygen  . Syncope   . Thyroid disease     Past Surgical History:  Procedure Laterality Date  . A-V CARDIAC PACEMAKER INSERTION  1995  . COLONOSCOPY  2008  . COLONOSCOPY N/A 06/23/2015   [id number] diverticulosis  . ESOPHAGOGASTRODUODENOSCOPY N/A 06/23/2015   KGM:WNUU portal gastropathy  . IR GENERIC HISTORICAL  12/14/2016   IR PARACENTESIS 12/14/2016 Sandi Mariscal, MD [facility]  . IR GENERIC HISTORICAL  12/14/2016   IR TIPS 12/14/2016 Sandi Mariscal, MD [facility]  . IR PARACENTESIS  01/12/2017  . IR RADIOLOGIST EVAL & MGMT  01/06/2017  . IR RADIOLOGIST EVAL & MGMT  02/08/2017  . IR RADIOLOGIST EVAL & MGMT  11/30/2016  . IR RADIOLOGIST EVAL & MGMT  09/22/2017  . IR TIPS  01/12/2017  . NASAL SINUS SURGERY     partially removed  . ORCHIECTOMY  1990   bilateral; ?neoplasm  . PACEMAKER INSERTION  05/2004; 10/04/2013   MDT dual chamber pacemaker; gen change 10/04/2013 (MDT ADDRL1)  . PERMANENT PACEMAKER GENERATOR CHANGE N/A 10/04/2013   Procedure: PERMANENT PACEMAKER GENERATOR CHANGE;  Surgeon: Evans Lance, MD;  Location: [facility] CATH LAB;  Service: Cardiovascular;  Laterality: N/A;  . RADIOLOGY WITH ANESTHESIA N/A 12/14/2016   Procedure: TIPS;  Surgeon: Sandi Mariscal, MD;   Location: [place];  Service: Radiology;  Laterality: N/A;  . RADIOLOGY WITH ANESTHESIA N/A 01/12/2017   Procedure: TIPS;  Surgeon: Sandi Mariscal, MD;  Location: [place] [facility];  Service: Radiology;  Laterality: N/A;  . REPAIR HYPOSPADIAS W/ URETHROPLASTY    . TONSILLECTOMY AND ADENOIDECTOMY    . TRANSURETHRAL RESECTION OF PROSTATE     for BPH    Family History  Problem Relation Age of Onset  . COPD Mother   . Hypertension Mother   . Congestive Heart Failure Mother   . Pneumonia Father   . Heart disease Other   . Hypertension Other   . Colon cancer Neg Hx     Social History   Socioeconomic History  . Marital status: Married    Spouse name: Not on file  . Number of children: Not on file  . Years of education: Not on file  . Highest education level: Not on file  Social Needs  . Financial resource strain: Not on file  . Food insecurity - worry: Not on file  . Food insecurity - inability: Not on file  . Transportation needs - medical: Not on file  . Transportation needs - non-medical: Not on file  Occupational History  . Not on file  Tobacco Use  . Smoking status: Never Smoker  . Smokeless tobacco: Never Used  . Tobacco comment: tobacco use- no   Substance and Sexual Activity  . Alcohol use: No    Alcohol/week: 0.0 oz    Comment: Hx. of alcohol use  . Drug use: No  . Sexual activity: No  Other Topics Concern  . Not on file  Social History Narrative   Part time.       ROS:  General: Negative for anorexia, weight loss, fever, chills, fatigue, weakness. Eyes: Negative for vision changes.  ENT: Negative for hoarseness, difficulty swallowing , nasal congestion. CV: Negative for chest pain, angina, palpitations, dyspnea on exertion, peripheral edema.  Respiratory: Negative for dyspnea at rest,  cough, sputum, wheezing. +DOE doesn't use oxygen like he is supposed to per wife GI: See history  of present illness. GU:  Negative for dysuria, hematuria, urinary incontinence, urinary  frequency, nocturnal urination.  MS: Negative for joint pain, low back pain.  Derm: Negative for rash or itching.  Neuro: Negative for weakness, abnormal sensation, seizure, frequent headaches, memory loss, confusion.  Psych: Negative for anxiety, depression, suicidal ideation, hallucinations.  Endo: Negative for unusual weight change.  Heme: Negative for bruising or bleeding. Allergy: Negative for rash or hives.    Physical Examination:  BP 106/80   Pulse 88   Temp (!) 97 F (36.1 C) (Oral)   Ht 5\' 3"  (1.6 m)   Wt 166 lb 9.6 oz (75.6 kg)   BMI 29.51 kg/m    General: appears older than stated age. Pleasant, hard of hearing in no acute distress.  Head: Normocephalic, atraumatic.   Eyes: Conjunctiva pink, no icterus. Mouth: Oropharyngeal mucosa moist and pink , no lesions erythema or exudate. Neck: Supple without thyromegaly, masses, or lymphadenopathy.  Lungs: Clear to auscultation bilaterally.  Heart: Regular rate and rhythm. Harsh 3/6 SEM  Abdomen: Bowel sounds are normal, nontender, nondistended, no hepatosplenomegaly or masses, no abdominal bruits , no rebound or guarding.  umb hernia easily reducible and nontender. No drainage note.  Rectal: not performed Extremities: No lower extremity edema. No clubbing or deformities.  Neuro: Alert and oriented x 4 , grossly normal neurologically.  Skin: Warm and dry, no rash or jaundice.   Psych: Alert and cooperative, normal mood and affect.  Labs: Lab Results  Component Value Date   CREATININE 1.01 10/02/2017   BUN 31 (H) 10/02/2017   NA 143 10/02/2017   K 4.8 10/02/2017   CL 101 10/02/2017   CO2 29 10/02/2017   Lab Results  Component Value Date   WBC 8.6 10/02/2017   HGB 13.3 10/02/2017   HCT 41.5 10/02/2017   MCV 101.2 (H) 10/02/2017   PLT 283 10/02/2017   Lab Results  Component Value Date   ALT 73 (H) 10/02/2017   AST 120 (H) 10/02/2017   ALKPHOS 660 (H) 10/02/2017   BILITOT 2.3 (H) 10/02/2017     Imaging  Studies: No results found.

## 2017-11-10 NOTE — Patient Instructions (Addendum)
1. Upper endoscopy as scheduled. See separate instructions.  2. Consider hernia belt to protect your hernia. If causes discomfort you do not have to wear it.  3. If you miss a dose of lactulose, you can increase your next dose. Example, if you normally take a tablespoon then take 1 and 1/2 tablespoons. Take enough lactulose to have 2-3 soft stools daily.

## 2017-11-10 NOTE — Progress Notes (Signed)
Patient needs CBC, CMET, PT/INR, AFP tumor marker in 3 months.

## 2017-11-11 ENCOUNTER — Other Ambulatory Visit: Payer: Self-pay

## 2017-11-11 DIAGNOSIS — R188 Other ascites: Principal | ICD-10-CM

## 2017-11-11 DIAGNOSIS — K7460 Unspecified cirrhosis of liver: Secondary | ICD-10-CM

## 2017-11-11 NOTE — Progress Notes (Signed)
Orders placed for 3 months.

## 2017-11-11 NOTE — Progress Notes (Signed)
cb

## 2017-11-11 NOTE — Progress Notes (Signed)
CC'D TO PCP °

## 2017-11-22 ENCOUNTER — Other Ambulatory Visit: Payer: Self-pay | Admitting: Cardiology

## 2017-11-24 ENCOUNTER — Telehealth: Payer: Self-pay

## 2017-11-24 NOTE — Telephone Encounter (Signed)
LM with pt assistance program to check on the status of Xifaxan 550mg  rx. Waiting on a return call.  Spoke with pts spouse. Samples are ready for pick up while pt is trying to get approved for pt assistance.

## 2017-12-02 ENCOUNTER — Emergency Department (HOSPITAL_COMMUNITY)
Admission: EM | Admit: 2017-12-02 | Discharge: 2017-12-02 | Disposition: A | Discharge status: Home / Self Care | Payer: Medicare HMO | Attending: Emergency Medicine | Admitting: Emergency Medicine

## 2017-12-02 ENCOUNTER — Emergency Department (HOSPITAL_COMMUNITY): Payer: Medicare HMO

## 2017-12-02 ENCOUNTER — Encounter (HOSPITAL_COMMUNITY): Payer: Self-pay | Admitting: Emergency Medicine

## 2017-12-02 ENCOUNTER — Other Ambulatory Visit: Payer: Self-pay

## 2017-12-02 DIAGNOSIS — Z794 Long term (current) use of insulin: Secondary | ICD-10-CM | POA: Diagnosis not present

## 2017-12-02 DIAGNOSIS — J449 Chronic obstructive pulmonary disease, unspecified: Secondary | ICD-10-CM | POA: Insufficient documentation

## 2017-12-02 DIAGNOSIS — E039 Hypothyroidism, unspecified: Secondary | ICD-10-CM | POA: Diagnosis not present

## 2017-12-02 DIAGNOSIS — Y999 Unspecified external cause status: Secondary | ICD-10-CM | POA: Diagnosis not present

## 2017-12-02 DIAGNOSIS — W01198A Fall on same level from slipping, tripping and stumbling with subsequent striking against other object, initial encounter: Secondary | ICD-10-CM | POA: Insufficient documentation

## 2017-12-02 DIAGNOSIS — E119 Type 2 diabetes mellitus without complications: Secondary | ICD-10-CM | POA: Insufficient documentation

## 2017-12-02 DIAGNOSIS — I110 Hypertensive heart disease with heart failure: Secondary | ICD-10-CM | POA: Diagnosis not present

## 2017-12-02 DIAGNOSIS — Y92009 Unspecified place in unspecified non-institutional (private) residence as the place of occurrence of the external cause: Secondary | ICD-10-CM

## 2017-12-02 DIAGNOSIS — I509 Heart failure, unspecified: Secondary | ICD-10-CM | POA: Insufficient documentation

## 2017-12-02 DIAGNOSIS — Y92094 Garage of other non-institutional residence as the place of occurrence of the external cause: Secondary | ICD-10-CM | POA: Insufficient documentation

## 2017-12-02 DIAGNOSIS — S81812A Laceration without foreign body, left lower leg, initial encounter: Secondary | ICD-10-CM | POA: Diagnosis not present

## 2017-12-02 DIAGNOSIS — W19XXXA Unspecified fall, initial encounter: Secondary | ICD-10-CM

## 2017-12-02 DIAGNOSIS — Y9389 Activity, other specified: Secondary | ICD-10-CM | POA: Insufficient documentation

## 2017-12-02 MED ORDER — LIDOCAINE-EPINEPHRINE (PF) 2 %-1:200000 IJ SOLN
10.0000 mL | Freq: Once | INTRAMUSCULAR | Status: AC
  Administered 2017-12-02: 10 mL via INTRADERMAL
  Filled 2017-12-02: qty 20

## 2017-12-02 MED ORDER — CEPHALEXIN 500 MG PO CAPS: 500.0000 mg | ORAL_CAPSULE | Freq: Four times a day (QID) | ORAL | 0 refills | Status: AC

## 2017-12-02 MED ORDER — POVIDONE-IODINE 10 % EX SOLN
CUTANEOUS | Status: AC
  Administered 2017-12-02: 16:00:00
  Filled 2017-12-02: qty 15

## 2017-12-02 MED ORDER — BACITRACIN-NEOMYCIN-POLYMYXIN 400-5-5000 EX OINT
TOPICAL_OINTMENT | CUTANEOUS | Status: AC
  Administered 2017-12-02: 16:00:00
  Filled 2017-12-02: qty 1

## 2017-12-02 MED ORDER — CEPHALEXIN 500 MG PO CAPS: 500.0000 mg | ORAL_CAPSULE | Freq: Four times a day (QID) | ORAL | 0 refills | Status: DC

## 2017-12-02 NOTE — ED Provider Notes (Signed)
[facility] EMERGENCY DEPARTMENT Provider Note   CSN: [id number] Arrival date & time: 12/02/17  1415     History   Chief Complaint Chief Complaint  Patient presents with  . Fall    HPI [name] is a 57 y.o. male with multiple comorbidity (see below) who presents with left leg laceration after fall at home.  Patient was working on his lawnmower when he fell.  Both patient and patient's wife not sure about the mechanics of his fall.  He thinks he might have fallen on the land mower.  Denies hitting his head or loss of consciousness. Wife says he was standing when she saw him after fall. Denies dizziness, lightheadedness, chest pain, shortness of breath, palpitation or any other prodrome leading to his fall.  Denies focal neuro deficit.  Denies headache or vision change.  Wife says he was at his usual state of health prior to fall this morning.  Denies recent illness, runny nose, congestion, nausea, vomiting, diarrhea, abdominal pain or dysuria.  Had history of fall in the past due to hepatic encephalopathy.  Reports good compliance with his lactulose.   HPI  Past Medical History:  Diagnosis Date  . Alcohol use   . Anemia   . Aortic stenosis    mild  . Arthritis   . Atrial flutter ([place])   . Benign hypertrophy of prostate    with urinary retention; repaired hypospadia; transurethral resection of the prostate   . Cardiac conduction disorder    status post pacemaker implantation in 1995 generator replaced 2005  . Chest pain 2007, 2009   cardiac cath > normal coronary arterie; nl left ventricular function  . CHF (congestive heart failure) ([place])   . Cirrhosis ([place]) 11/2016  . Congenital deafness   . COPD (chronic obstructive pulmonary disease) ([place])   . Diabetes mellitus    +Insulin  . Dyspnea   . Edema   . GERD (gastroesophageal reflux disease)    status post multiple dilatations for stricture  . Hepatic disease    NASH versus chronic active hepatitis aw cirrhosis;  inconclusive biospy in 1/10  . Hepatitis    Thinks it was B  . History of kidney stones   . HOH (hard of hearing)   . HTN (hypertension)   . Hyperlipidemia    statin discontinued due to abn LFTs  . Hypothyroidism   . Mitral regurgitation    insignificant  . Obesity   . Obesity 4/08   BMI= 40  . Pelvic mass    identified in 2009, stable 2010  . PONV (postoperative nausea and vomiting)   . Presence of permanent cardiac pacemaker   . Sleep apnea    BiPAP and continuous oxygen  . Syncope   . Thyroid disease     Patient Active Problem List   Diagnosis Date Noted  . Umbilical hernia without obstruction and without gangrene 11/10/2017  . S/P TIPS (transjugular intrahepatic portosystemic shunt)   . Transaminitis   . Hepatic encephalopathy ([place]) 04/19/2017  . Decompensated liver disease ([place]) 04/19/2017  . History of head injury 01/27/2017  . Malnutrition of moderate degree 12/24/2016  . Chronic respiratory failure with hypoxia ([place]) 12/24/2016  . COPD (chronic obstructive pulmonary disease) ([place]) 12/23/2016  . Liver cirrhosis secondary to NASH ([place]) 12/14/2016  . Anemia 11/11/2016  . AKI (acute kidney injury) ([place]) 11/11/2016  . Hyperkalemia 11/11/2016  . Hypothyroidism 07/15/2016  . Elevated alkaline phosphatase level 06/14/2016  . Elevated LFTs 06/14/2016  . Ascites   .  Diarrhea 02/29/2016  . Abnormal liver function 02/29/2016  . Atrial flutter ([place]) 02/29/2016  . Hypoglycemia 02/29/2016  . Abdominal pain 02/29/2016  . Jejunitis   . Adenomatous colon polyp 09/01/2015  . Portal hypertensive gastropathy ([place])   . Heme + stool 06/02/2015  . Hematuria 10/26/2013  . HTN (hypertension)   . Atrial fibrillation ([place]) 10/19/2013  . Chronic diastolic heart failure ([place]) 09/19/2013  . Chest pain   . Aortic stenosis   . Hypertension   . Hyperlipidemia   . Pelvic mass   . Cirrhosis of liver with ascites ([place])   . Benign hypertrophy of prostate   . Sleep apnea   .  GASTROESOPHAGEAL REFLUX DISEASE 05/19/2010  . Presence of permanent cardiac pacemaker 03/18/2010  . Type 2 diabetes mellitus ([place]) 06/27/2008  . ANEMIA, MILD 06/27/2008  . Deafness congenital 06/27/2008  . CONDUCTION DISORDER OF THE HEART 06/27/2008    Past Surgical History:  Procedure Laterality Date  . A-V CARDIAC PACEMAKER INSERTION  1995  . COLONOSCOPY  2008  . COLONOSCOPY N/A 06/23/2015   BDZ:HGDJMEQ diverticulosis  . ESOPHAGOGASTRODUODENOSCOPY N/A 06/23/2015   AST:MHDQ portal gastropathy  . IR GENERIC HISTORICAL  12/14/2016   IR PARACENTESIS 12/14/2016 Sandi Mariscal, MD [facility]INTER[facility] RAD  . IR GENERIC HISTORICAL  12/14/2016   IR TIPS 12/14/2016 Sandi Mariscal, MD [facility]  . IR PARACENTESIS  01/12/2017  . IR RADIOLOGIST EVAL & MGMT  01/06/2017  . IR RADIOLOGIST EVAL & MGMT  02/08/2017  . IR RADIOLOGIST EVAL & MGMT  11/30/2016  . IR RADIOLOGIST EVAL & MGMT  09/22/2017  . IR TIPS  01/12/2017  . NASAL SINUS SURGERY     partially removed  . ORCHIECTOMY  1990   bilateral; ?neoplasm  . PACEMAKER INSERTION  05/2004; 10/04/2013   MDT dual chamber pacemaker; gen change 10/04/2013 (MDT ADDRL1)  . PERMANENT PACEMAKER GENERATOR CHANGE N/A 10/04/2013   Procedure: PERMANENT PACEMAKER GENERATOR CHANGE;  Surgeon: Evans Lance, MD;  Location: [facility] CATH LAB;  Service: Cardiovascular;  Laterality: N/A;  . RADIOLOGY WITH ANESTHESIA N/A 12/14/2016   Procedure: TIPS;  Surgeon: Sandi Mariscal, MD;  Location: [place];  Service: Radiology;  Laterality: N/A;  . RADIOLOGY WITH ANESTHESIA N/A 01/12/2017   Procedure: TIPS;  Surgeon: Sandi Mariscal, MD;  Location: [place];  Service: Radiology;  Laterality: N/A;  . REPAIR HYPOSPADIAS W/ URETHROPLASTY    . TONSILLECTOMY AND ADENOIDECTOMY    . TRANSURETHRAL RESECTION OF PROSTATE     for BPH       Home Medications    Prior to Admission medications   Medication Sig Start Date End Date Taking? Authorizing Provider  acetaminophen (TYLENOL) 325 MG tablet Take 325 mg by mouth every 6  (six) hours as needed for mild pain or headache.     [provider]  albuterol (PROVENTIL HFA;VENTOLIN HFA) 108 (90 Base) MCG/ACT inhaler Inhale 2 puffs into the lungs every 6 (six) hours as needed for wheezing or shortness of breath.    [provider]  albuterol (PROVENTIL) (2.5 MG/3ML) 0.083% nebulizer solution Take 2.5 mg by nebulization every 6 (six) hours as needed for wheezing or shortness of breath.    [provider]  Calcium Carb-Cholecalciferol (CALCIUM 600+D) 600-800 MG-UNIT TABS Take 1 tablet by mouth every evening.     [provider]  cetirizine (ZYRTEC) 10 MG tablet Take 10 mg by mouth daily.    [provider]  feeding supplement, ENSURE ENLIVE, (ENSURE ENLIVE) LIQD Take 237 mLs by  mouth 3 (three) times daily between meals. 04/19/17   Dhungel, Flonnie Overman, MD  furosemide (LASIX) 20 MG tablet Take 1 tablet (20 mg total) daily by mouth. 08/04/17   Branch, Alphonse Guild, MD  gentamicin ointment (GARAMYCIN) 0.1 % Apply 1 application topically at bedtime as needed (for nose bleeds).     [provider]  HUMALOG MIX 75/25 KWIKPEN (75-25) 100 UNIT/ML Kwikpen Inject 8 Units into the skin 2 (two) times daily. 15 units in the morning and 15 units in the evening Patient taking differently: Inject 2-8 Units into the skin See admin instructions. Inject 4 units SQ in the morning unless BS 100 or below only inject 4 units SQ, inject 4 units SQ at supper time unless BS 100 or below only inject 2 units SQ 11/14/16   Eber Jones, MD  lactulose [facility]) 10 GM/15ML solution 10cc (2 teaspoons) three times daily; Titrate to 2-3 semi formed stools a day Patient taking differently: Take 10 g by mouth 2 (two) times daily.  05/25/17   Annitta Needs, NP  levothyroxine (LEVOXYL) 50 MCG tablet Take 50 mcg by mouth daily before breakfast.     [provider]  metoprolol succinate (TOPROL-XL) 25 MG 24 hr tablet Take 1 tablet (25 mg total) daily by  mouth. 08/04/17   Branch, Alphonse Guild, MD  modafinil (PROVIGIL) 200 MG tablet Take 200 mg by mouth daily.  01/13/15   [provider]  mometasone (NASONEX) 50 MCG/ACT nasal spray Place 2 sprays into the nose daily as needed (for congestion/allergies).     [provider]  Multiple Vitamins-Minerals (CENTRUM) tablet Take 1 tablet by mouth every evening.     [provider]  nitroGLYCERIN (NITROSTAT) 0.4 MG SL tablet Place 1 tablet (0.4 mg total) under the tongue every 5 (five) minutes as needed for chest pain. 06/26/15   Lendon Colonel, NP  omeprazole (PRILOSEC) 40 MG capsule TAKE 1 CAPSULE BY MOUTH ONCE DAILY FOR REFLUX. 11/22/17   Arnoldo Lenis, MD  spironolactone (ALDACTONE) 50 MG tablet Take 1 tablet (50 mg total) daily by mouth. 08/04/17   Branch, Alphonse Guild, MD  Tetrahydrozoline HCl (VISINE OP) Place 2 drops into both eyes 2 (two) times daily as needed (for allergies).    [provider]  ursodiol (ACTIGALL) 500 MG tablet TAKE 1 TABLET BY MOUTH TWICE DAILY 08/09/17   Annitta Needs, NP  XIFAXAN 550 MG TABS tablet TAKE 1 TABLET BY MOUTH TWICE DAILY Patient not taking: Reported on 11/10/2017 08/31/17   Annitta Needs, NP    Family History Family History  Problem Relation Age of Onset  . COPD Mother   . Hypertension Mother   . Congestive Heart Failure Mother   . Pneumonia Father   . Heart disease Other   . Hypertension Other   . Colon cancer Neg Hx     Social History Social History   Tobacco Use  . Smoking status: Never Smoker  . Smokeless tobacco: Never Used  . Tobacco comment: tobacco use- no   Substance Use Topics  . Alcohol use: No    Alcohol/week: 0.0 oz    Comment: Hx. of alcohol use  . Drug use: No     Allergies   Penicillins; Enalapril; Metformin and related; Ciprofloxacin; and Eliquis [apixaban]   Review of Systems Review of Systems  Constitutional: Negative for chills and fever.  HENT: Negative for congestion, hearing  loss, rhinorrhea and sore throat.   Eyes: Negative for visual  disturbance.  Respiratory: Negative for cough and shortness of breath.   Cardiovascular: Negative for chest pain, palpitations and leg swelling.  Gastrointestinal: Negative for abdominal pain, constipation, nausea and vomiting.  Genitourinary: Negative for difficulty urinating, dysuria, frequency and hematuria.  Musculoskeletal: Negative for arthralgias and myalgias.  Skin: Negative for rash.  Neurological: Negative for dizziness, syncope, weakness, light-headedness, numbness and headaches.  Psychiatric/Behavioral: Negative for confusion and dysphoric mood.   Physical Exam Updated Vital Signs BP 115/69 (BP Location: Left Arm)   Pulse 88   Temp 98.3 F (36.8 C) (Oral)   Resp 16   Ht 5\' 3"  (1.6 m)   Wt 69.4 kg (153 lb)   SpO2 92%   BMI 27.10 kg/m   Physical Exam GEN: appears well, no apparent distress. Head: normocephalic and atraumatic  Eyes: conjunctiva without injection, sclera anicteric, PERRLA, EOMI Oropharynx: mmm without erythema, exudation or petechiae.  HEM: negative for cervical or periauricular lymphadenopathies CVS: RRR, nl s1 & s2, 2/6 SEM all over, no edema,  faint DP pulses bilaterally RESP: no IWOB, good air movement bilaterally, CTAB GI: BS present & normal, soft, NTND, no guarding, no rebound, umbilical hernia (reducible) GU: no suprapubic or CVA tenderness MSK: no focal tenderness or notable swelling SKIN: Extremities cool to touch bilaterally, vertical skin laceration over his left mid-shin anteromedially about 10 cm long with discolored flap of skin, mild superficial bruises over his left knee, full range of motion in his knee and ankle without pain, knee ligaments intact in all direction.  No tenderness over spinous processes.    NEURO: alert and awake.  Oriented to self and person but not to time (wife says this is baseline due to autism) PSYCH: euthymic mood with congruent affect   ED  Treatments / Results  Labs (all labs ordered are listed, but only abnormal results are displayed) Labs Reviewed - No data to display  EKG  EKG Interpretation None       Radiology No results found.  Procedures .Marland KitchenLaceration Repair Date/Time: 12/02/2017 4:20 PM Performed by: Mercy Riding, MD Authorized by: Elnora Morrison, MD   Consent:    Consent obtained:  Verbal   Consent given by:  Guardian Anesthesia (see MAR for exact dosages):    Anesthesia method:  Local infiltration   Local anesthetic:  Lidocaine 2% WITH epi Laceration details:    Location:  Leg   Leg location:  L lower leg   Length (cm):  10   Depth (mm):  0.3 Repair type:    Repair type: Laceration difficult for stitching due noviable skin flap. Trimmed part of viable flap of skin and dressed with Teflo and wrapped with gauze fluff. Exploration:    Hemostasis achieved with:  Direct pressure   Wound extent: no foreign bodies/material noted, no muscle damage noted, no tendon damage noted and no underlying fracture noted     Contaminated: no   Treatment:    Area cleansed with:  Betadine   Amount of cleaning:  Standard   Irrigation solution:  Sterile saline   Irrigation method:  Syringe   Visualized foreign bodies/material removed: no   Skin repair:    Repair method: Teflo. Approximation:    Approximation:  Loose Post-procedure details:    Dressing:  Antibiotic ointment, non-adherent dressing and tube gauze   Patient tolerance of procedure:  Tolerated well, no immediate complications Comments:     Face to face for home wound care RN ordered.   (including critical care time)  Medications Ordered  in ED Medications - No data to display   Initial Impression / Assessment and Plan / ED Course  I have reviewed the triage vital signs and the nursing notes.  Pertinent labs & imaging results that were available during my care of the patient were reviewed by me and considered in my medical decision making (see  chart for details).  57 year old male with multiple comorbidities who presents with left shin laceration after a fall which appears to be mechanical.  Wife found him standing after fall. He was at his usual state of health prior to fall per his wife. He denies hitting his head or loss of consciousness.  Denies any prodrome leading to this. Exam with vertical skin laceration over his left mid-shin anteromedially about 10 cm long with discolored flap of skin, mild superficial bruises over his left knee, full range of motion in his knee and ankle without pain, knee ligaments intact in all direction.  No tenderness over spinous processes.   DG left tibia/fibula without fracture.  Wound cleaning and dressing as above.  Discharged on Keflex 500 mg 4 times daily for 10 days.  Face-to-face home wound care RN ordered.  Patient to follow-up with PCP in 3 days.  Return precautions discussed in detail. Guardian at bedside for the whole encounter and procedure. Final Clinical Impressions(s) / ED Diagnoses   Final diagnoses:  None    ED Discharge Orders    None       Mercy Riding, MD 12/02/17 1630    Elnora Morrison, MD 12/05/17 [phone]

## 2017-12-02 NOTE — Discharge Instructions (Signed)
It is nice to meet you today! Your x-ray did not show any bone fracture.  We ordered a wound care nurse to come to your house to help with wound dressing.  We also gave you a prescription for antibiotic.  Take this medication until you have any the whole course.  Please follow-up with your primary care doctor as soon as possible.  Seek immediate care if you have fever, leg swelling, severe leg pain or other symptoms concerning to you. Take care,

## 2017-12-02 NOTE — ED Triage Notes (Signed)
Pt fell on front porch this morning. C/o pain to LT knee and shin. Pt with laceration to shin. Bleeding controlled. Denies hitting head/LOC.

## 2017-12-06 ENCOUNTER — Telehealth: Payer: Self-pay | Admitting: Cardiology

## 2017-12-06 ENCOUNTER — Encounter: Payer: Medicare HMO | Admitting: *Deleted

## 2017-12-06 NOTE — Telephone Encounter (Signed)
Confirmed remote transmission w/ pt wife.   

## 2017-12-07 ENCOUNTER — Other Ambulatory Visit: Payer: Self-pay | Admitting: Gastroenterology

## 2017-12-08 ENCOUNTER — Encounter: Payer: Self-pay | Admitting: Cardiology

## 2017-12-08 NOTE — Progress Notes (Signed)
Letter  

## 2017-12-12 ENCOUNTER — Ambulatory Visit (INDEPENDENT_AMBULATORY_CARE_PROVIDER_SITE_OTHER): Payer: Medicare HMO | Admitting: *Deleted

## 2017-12-12 DIAGNOSIS — I495 Sick sinus syndrome: Secondary | ICD-10-CM

## 2017-12-12 NOTE — Progress Notes (Signed)
Remote pacemaker transmission.   

## 2017-12-13 ENCOUNTER — Encounter: Payer: Self-pay | Admitting: Cardiology

## 2017-12-13 ENCOUNTER — Telehealth: Payer: Self-pay

## 2017-12-13 NOTE — Patient Instructions (Signed)
YOBANY VROOM  12/13/2017     @PREFPERIOPPHARMACY @   Your procedure is scheduled on  12/22/2017 .  Report to [facility] at  615   A.M.  Call this number if you have problems the morning of surgery:  [phone]   Remember:  Do not eat food or drink liquids after midnight.  Take these medicines the morning of surgery with A SIP OF WATER  Zyrtec, levothyroxine, metoprolol, provigil, prilosec, actigall, xifaxan. Use your inhaler and your nebulizer before you come. DO NOT take any medications for diabetes the morning of your procedure.   Do not wear jewelry, make-up or nail polish.  Do not wear lotions, powders, or perfumes, or deodorant.  Do not shave 48 hours prior to surgery.  Men may shave face and neck.  Do not bring valuables to the hospital.  [facility] is not responsible for any belongings or valuables.  Contacts, dentures or bridgework may not be worn into surgery.  Leave your suitcase in the car.  After surgery it may be brought to your room.  For patients admitted to the hospital, discharge time will be determined by your treatment team.  Patients discharged the day of surgery will not be allowed to drive home.   Name and phone number of your driver:   family Special instructions:  Follow the diet and prep instructions given to you by Dr Roseanne Kaufman office.  Please read over the following fact sheets that you were given. Anesthesia Post-op Instructions and Care and Recovery After Surgery       Esophagogastroduodenoscopy Esophagogastroduodenoscopy (EGD) is a procedure to examine the lining of the esophagus, stomach, and first part of the small intestine (duodenum). This procedure is done to check for problems such as inflammation, bleeding, ulcers, or growths. During this procedure, a long, flexible, lighted tube with a camera attached (endoscope) is inserted down the throat. Tell a health care provider about:  Any allergies you have.  All medicines  you are taking, including vitamins, herbs, eye drops, creams, and over-the-counter medicines.  Any problems you or family members have had with anesthetic medicines.  Any blood disorders you have.  Any surgeries you have had.  Any medical conditions you have.  Whether you are pregnant or may be pregnant. What are the risks? Generally, this is a safe procedure. However, problems may occur, including:  Infection.  Bleeding.  A tear (perforation) in the esophagus, stomach, or duodenum.  Trouble breathing.  Excessive sweating.  Spasms of the larynx.  A slowed heartbeat.  Low blood pressure.  What happens before the procedure?  Follow instructions from your health care provider about eating or drinking restrictions.  Ask your health care provider about: ? Changing or stopping your regular medicines. This is especially important if you are taking diabetes medicines or blood thinners. ? Taking medicines such as aspirin and ibuprofen. These medicines can thin your blood. Do not take these medicines before your procedure if your health care provider instructs you not to.  Plan to have someone take you home after the procedure.  If you wear dentures, be ready to remove them before the procedure. What happens during the procedure?  To reduce your risk of infection, your health care team will wash or sanitize their hands.  An IV tube will be put in a vein in your hand or arm. You will get medicines and fluids through this tube.  You will be given one  or more of the following: ? A medicine to help you relax (sedative). ? A medicine to numb the area (local anesthetic). This medicine may be sprayed into your throat. It will make you feel more comfortable and keep you from gagging or coughing during the procedure. ? A medicine for pain.  A mouth guard may be placed in your mouth to protect your teeth and to keep you from biting on the endoscope.  You will be asked to lie on your  left side.  The endoscope will be lowered down your throat into your esophagus, stomach, and duodenum.  Air will be put into the endoscope. This will help your health care provider see better.  The lining of your esophagus, stomach, and duodenum will be examined.  Your health care provider may: ? Take a tissue sample so it can be looked at in a lab (biopsy). ? Remove growths. ? Remove objects (foreign bodies) that are stuck. ? Treat any bleeding with medicines or other devices that stop tissue from bleeding. ? Widen (dilate) or stretch narrowed areas of your esophagus and stomach.  The endoscope will be taken out. The procedure may vary among health care providers and hospitals. What happens after the procedure?  Your blood pressure, heart rate, breathing rate, and blood oxygen level will be monitored often until the medicines you were given have worn off.  Do not eat or drink anything until the numbing medicine has worn off and your gag reflex has returned. This information is not intended to replace advice given to you by your health care provider. Make sure you discuss any questions you have with your health care provider. Document Released: 01/07/2005 Document Revised: 02/12/2016 Document Reviewed: 07/31/2015 Elsevier Interactive Patient Education  2018 [employer]. Esophagogastroduodenoscopy, Care After Refer to this sheet in the next few weeks. These instructions provide you with information about caring for yourself after your procedure. Your health care provider may also give you more specific instructions. Your treatment has been planned according to current medical practices, but problems sometimes occur. Call your health care provider if you have any problems or questions after your procedure. What can I expect after the procedure? After the procedure, it is common to have:  A sore throat.  Nausea.  Bloating.  Dizziness.  Fatigue.  Follow these instructions at  home:  Do not eat or drink anything until the numbing medicine (local anesthetic) has worn off and your gag reflex has returned. You will know that the local anesthetic has worn off when you can swallow comfortably.  Do not drive for 24 hours if you received a medicine to help you relax (sedative).  If your health care provider took a tissue sample for testing during the procedure, make sure to get your test results. This is your responsibility. Ask your health care provider or the department performing the test when your results will be ready.  Keep all follow-up visits as told by your health care provider. This is important. Contact a health care provider if:  You cannot stop coughing.  You are not urinating.  You are urinating less than usual. Get help right away if:  You have trouble swallowing.  You cannot eat or drink.  You have throat or chest pain that gets worse.  You are dizzy or light-headed.  You faint.  You have nausea or vomiting.  You have chills.  You have a fever.  You have severe abdominal pain.  You have black, tarry, or bloody stools.  This information is not intended to replace advice given to you by your health care provider. Make sure you discuss any questions you have with your health care provider. Document Released: 08/23/2012 Document Revised: 02/12/2016 Document Reviewed: 07/31/2015 Elsevier Interactive Patient Education  2018 [place] Anesthesia is a term that refers to techniques, procedures, and medicines that help a person stay safe and comfortable during a medical procedure. Monitored anesthesia care, or sedation, is one type of anesthesia. Your anesthesia specialist may recommend sedation if you will be having a procedure that does not require you to be unconscious, such as:  Cataract surgery.  A dental procedure.  A biopsy.  A colonoscopy.  During the procedure, you may receive a medicine to help  you relax (sedative). There are three levels of sedation:  Mild sedation. At this level, you may feel awake and relaxed. You will be able to follow directions.  Moderate sedation. At this level, you will be sleepy. You may not remember the procedure.  Deep sedation. At this level, you will be asleep. You will not remember the procedure.  The more medicine you are given, the deeper your level of sedation will be. Depending on how you respond to the procedure, the anesthesia specialist may change your level of sedation or the type of anesthesia to fit your needs. An anesthesia specialist will monitor you closely during the procedure. Let your health care provider know about:  Any allergies you have.  All medicines you are taking, including vitamins, herbs, eye drops, creams, and over-the-counter medicines.  Any use of steroids (by mouth or as a cream).  Any problems you or family members have had with sedatives and anesthetic medicines.  Any blood disorders you have.  Any surgeries you have had.  Any medical conditions you have, such as sleep apnea.  Whether you are pregnant or may be pregnant.  Any use of cigarettes, alcohol, or street drugs. What are the risks? Generally, this is a safe procedure. However, problems may occur, including:  Getting too much medicine (oversedation).  Nausea.  Allergic reaction to medicines.  Trouble breathing. If this happens, a breathing tube may be used to help with breathing. It will be removed when you are awake and breathing on your own.  Heart trouble.  Lung trouble.  Before the procedure Staying hydrated Follow instructions from your health care provider about hydration, which may include:  Up to 2 hours before the procedure - you may continue to drink clear liquids, such as water, clear fruit juice, black coffee, and plain tea.  Eating and drinking restrictions Follow instructions from your health care provider about eating and  drinking, which may include:  8 hours before the procedure - stop eating heavy meals or foods such as meat, fried foods, or fatty foods.  6 hours before the procedure - stop eating light meals or foods, such as toast or cereal.  6 hours before the procedure - stop drinking milk or drinks that contain milk.  2 hours before the procedure - stop drinking clear liquids.  Medicines Ask your health care provider about:  Changing or stopping your regular medicines. This is especially important if you are taking diabetes medicines or blood thinners.  Taking medicines such as aspirin and ibuprofen. These medicines can thin your blood. Do not take these medicines before your procedure if your health care provider instructs you not to.  Tests and exams  You will have a physical exam.  You may  have blood tests done to show: ? How well your kidneys and liver are working. ? How well your blood can clot.  General instructions  Plan to have someone take you home from the hospital or clinic.  If you will be going home right after the procedure, plan to have someone with you for 24 hours.  What happens during the procedure?  Your blood pressure, heart rate, breathing, level of pain and overall condition will be monitored.  An IV tube will be inserted into one of your veins.  Your anesthesia specialist will give you medicines as needed to keep you comfortable during the procedure. This may mean changing the level of sedation.  The procedure will be performed. After the procedure  Your blood pressure, heart rate, breathing rate, and blood oxygen level will be monitored until the medicines you were given have worn off.  Do not drive for 24 hours if you received a sedative.  You may: ? Feel sleepy, clumsy, or nauseous. ? Feel forgetful about what happened after the procedure. ? Have a sore throat if you had a breathing tube during the procedure. ? Vomit. This information is not intended  to replace advice given to you by your health care provider. Make sure you discuss any questions you have with your health care provider. Document Released: 06/02/2005 Document Revised: 02/13/2016 Document Reviewed: 12/28/2015 Elsevier Interactive Patient Education  2018 [place], Care After These instructions provide you with information about caring for yourself after your procedure. Your health care provider may also give you more specific instructions. Your treatment has been planned according to current medical practices, but problems sometimes occur. Call your health care provider if you have any problems or questions after your procedure. What can I expect after the procedure? After your procedure, it is common to:  Feel sleepy for several hours.  Feel clumsy and have poor balance for several hours.  Feel forgetful about what happened after the procedure.  Have poor judgment for several hours.  Feel nauseous or vomit.  Have a sore throat if you had a breathing tube during the procedure.  Follow these instructions at home: For at least 24 hours after the procedure:   Do not: ? Participate in activities in which you could fall or become injured. ? Drive. ? Use heavy machinery. ? Drink alcohol. ? Take sleeping pills or medicines that cause drowsiness. ? Make important decisions or sign legal documents. ? Take care of children on your own.  Rest. Eating and drinking  Follow the diet that is recommended by your health care provider.  If you vomit, drink water, juice, or soup when you can drink without vomiting.  Make sure you have little or no nausea before eating solid foods. General instructions  Have a responsible adult stay with you until you are awake and alert.  Take over-the-counter and prescription medicines only as told by your health care provider.  If you smoke, do not smoke without supervision.  Keep all follow-up visits  as told by your health care provider. This is important. Contact a health care provider if:  You keep feeling nauseous or you keep vomiting.  You feel light-headed.  You develop a rash.  You have a fever. Get help right away if:  You have trouble breathing. This information is not intended to replace advice given to you by your health care provider. Make sure you discuss any questions you have with your health care provider. Document Released:  12/28/2015 Document Revised: 04/28/2016 Document Reviewed: 12/28/2015 Elsevier Interactive Patient Education  [employer].

## 2017-12-13 NOTE — Telephone Encounter (Signed)
Pt's wife called office. He will soon be running out of Xifaxan samples. She can come by office 12/15/17 to pick up more. She asked about his pt assitance.  Routing to AM.

## 2017-12-14 NOTE — Telephone Encounter (Signed)
Xifaxan #[id number] placed at front desk.

## 2017-12-14 NOTE — Telephone Encounter (Signed)
Noted  

## 2017-12-15 ENCOUNTER — Encounter (HOSPITAL_COMMUNITY): Payer: Self-pay

## 2017-12-15 ENCOUNTER — Other Ambulatory Visit: Payer: Self-pay

## 2017-12-15 ENCOUNTER — Encounter (HOSPITAL_COMMUNITY)
Admission: RE | Admit: 2017-12-15 | Discharge: 2017-12-15 | Disposition: A | Discharge status: Home / Self Care | Payer: Medicare HMO | Source: Ambulatory Visit | Attending: Internal Medicine | Admitting: Internal Medicine

## 2017-12-15 DIAGNOSIS — Z01812 Encounter for preprocedural laboratory examination: Secondary | ICD-10-CM | POA: Insufficient documentation

## 2017-12-15 LAB — CBC WITH DIFFERENTIAL/PLATELET
BASOS PCT: 1 %
Basophils Absolute: 0.1 10*3/uL (ref 0.0–0.1)
Eosinophils Absolute: 0.2 10*3/uL (ref 0.0–0.7)
Eosinophils Relative: 4 %
HEMATOCRIT: 39.8 % (ref 39.0–52.0)
Hemoglobin: 12.2 g/dL — ABNORMAL LOW (ref 13.0–17.0)
Lymphocytes Relative: 12 %
Lymphs Abs: 0.8 10*3/uL (ref 0.7–4.0)
MCH: 31.7 pg (ref 26.0–34.0)
MCHC: 30.7 g/dL (ref 30.0–36.0)
MCV: 103.4 fL — ABNORMAL HIGH (ref 78.0–100.0)
MONOS PCT: 11 %
Monocytes Absolute: 0.8 10*3/uL (ref 0.1–1.0)
NEUTROS ABS: 4.9 10*3/uL (ref 1.7–7.7)
Neutrophils Relative %: 72 %
PLATELETS: 275 10*3/uL (ref 150–400)
RBC: 3.85 MIL/uL — ABNORMAL LOW (ref 4.22–5.81)
RDW: 16 % — AB (ref 11.5–15.5)
WBC: 6.8 10*3/uL (ref 4.0–10.5)

## 2017-12-15 LAB — CUP PACEART REMOTE DEVICE CHECK
Battery Impedance: 183 Ohm
Brady Statistic AP VP Percent: 0 %
Brady Statistic AP VS Percent: 0 %
Brady Statistic AS VP Percent: 0 %
Brady Statistic AS VS Percent: 100 %
Date Time Interrogation Session: 20190323155423
Implantable Lead Implant Date: 20050923
Implantable Lead Implant Date: 20050923
Implantable Lead Location: 753859
Implantable Lead Model: 4524
Lead Channel Impedance Value: 247 Ohm
Lead Channel Impedance Value: 329 Ohm
Lead Channel Setting Pacing Amplitude: 2 V
Lead Channel Setting Pacing Amplitude: 2.5 V
Lead Channel Setting Pacing Pulse Width: 0.4 ms
MDC IDC LEAD LOCATION: 753860
MDC IDC MSMT BATTERY REMAINING LONGEVITY: 130 mo
MDC IDC MSMT BATTERY VOLTAGE: 2.79 V
MDC IDC MSMT LEADCHNL RV PACING THRESHOLD AMPLITUDE: 0.5 V
MDC IDC MSMT LEADCHNL RV PACING THRESHOLD PULSEWIDTH: 0.4 ms
MDC IDC PG IMPLANT DT: 20150115
MDC IDC SET LEADCHNL RV SENSING SENSITIVITY: 2 mV

## 2017-12-15 LAB — BASIC METABOLIC PANEL
Anion gap: 12 (ref 5–15)
BUN: 32 mg/dL — ABNORMAL HIGH (ref 6–20)
CO2: 27 mmol/L (ref 22–32)
CREATININE: 1.3 mg/dL — AB (ref 0.61–1.24)
Calcium: 8.8 mg/dL — ABNORMAL LOW (ref 8.9–10.3)
Chloride: 100 mmol/L — ABNORMAL LOW (ref 101–111)
GFR, EST NON AFRICAN AMERICAN: 59 mL/min — AB (ref >60)
GLUCOSE: 213 mg/dL — AB (ref 65–99)
Potassium: 5.3 mmol/L — ABNORMAL HIGH (ref 3.5–5.1)
Sodium: 139 mmol/L (ref 135–145)

## 2017-12-19 ENCOUNTER — Other Ambulatory Visit: Payer: Self-pay

## 2017-12-19 DIAGNOSIS — K7460 Unspecified cirrhosis of liver: Secondary | ICD-10-CM

## 2017-12-19 LAB — BASIC METABOLIC PANEL
BUN/Creatinine Ratio: 30 (calc) — ABNORMAL HIGH (ref 6–22)
BUN: 32 mg/dL — ABNORMAL HIGH (ref 7–25)
CALCIUM: 8.6 mg/dL (ref 8.6–10.3)
CHLORIDE: 102 mmol/L (ref 98–110)
CO2: 32 mmol/L (ref 20–32)
CREATININE: 1.06 mg/dL (ref 0.70–1.33)
Glucose, Bld: 151 mg/dL — ABNORMAL HIGH (ref 65–139)
Potassium: 5.1 mmol/L (ref 3.5–5.3)
Sodium: 140 mmol/L (ref 135–146)

## 2017-12-20 ENCOUNTER — Telehealth: Payer: Self-pay | Admitting: Internal Medicine

## 2017-12-20 ENCOUNTER — Other Ambulatory Visit: Payer: Self-pay | Admitting: Cardiology

## 2017-12-20 NOTE — Telephone Encounter (Signed)
Spoke with spouse. Labs haven't been released at this time.

## 2017-12-20 NOTE — Telephone Encounter (Signed)
Pt's wife Olin Hauser) would like for the nurse to call her regarding patient's lab work. [phone]

## 2017-12-21 ENCOUNTER — Encounter: Payer: Self-pay | Admitting: Internal Medicine

## 2017-12-21 ENCOUNTER — Ambulatory Visit: Payer: Medicare HMO | Admitting: Internal Medicine

## 2017-12-21 ENCOUNTER — Telehealth: Payer: Self-pay | Admitting: Internal Medicine

## 2017-12-21 ENCOUNTER — Telehealth: Payer: Self-pay

## 2017-12-21 VITALS — BP 102/76 | HR 86 | Ht 63.0 in | Wt 179.8 lb

## 2017-12-21 DIAGNOSIS — I481 Persistent atrial fibrillation: Secondary | ICD-10-CM | POA: Diagnosis not present

## 2017-12-21 DIAGNOSIS — I350 Nonrheumatic aortic (valve) stenosis: Secondary | ICD-10-CM

## 2017-12-21 DIAGNOSIS — I4819 Other persistent atrial fibrillation: Secondary | ICD-10-CM

## 2017-12-21 NOTE — Telephone Encounter (Signed)
Letter mailed

## 2017-12-21 NOTE — Progress Notes (Addendum)
HPI [name] to returns today for followup of diastolic heart failure. He is a very pleasant 57 yo man with a h/o atrial fibrillation, symptomatic tachy-brady syndrome, status post pacemaker insertion, HTN, diastolic heart failure and syncope. He denies chest pain or sob. He has been diagnosed with cirrhosis. He has undergone TIPS procedure. He denies palpitations. No syncope. He recently injured his left leg when he fell.   Allergies  Allergen Reactions  . Penicillins Swelling and Other (See Comments)    SWELLING OF AIRWAY  PATIENT HAD A PCN REACTION WITH IMMEDIATE RASH, FACIAL/TONGUE/THROAT SWELLING, SOB, OR LIGHTHEADEDNESS WITH HYPOTENSION:  #  #  #  YES  #  #  #  Has patient had a PCN reaction causing severe rash involving mucus membranes or skin necrosis: No Has patient had a PCN reaction that required hospitalization No Has patient had a PCN reaction occurring within the last 10 years: No  . Enalapril Cough  . Metformin And Related Diarrhea  . Ciprofloxacin     Itching and red rash up arm when infusing after 3rd cipro dose for uti on 10/27/13  . Eliquis [Apixaban] Other (See Comments)    DOSE RELATED BLEEDING     Current Outpatient Medications  Medication Sig Dispense Refill  . acetaminophen (TYLENOL) 325 MG tablet Take 325 mg by mouth every 6 (six) hours as needed for mild pain or headache.     . albuterol (PROVENTIL HFA;VENTOLIN HFA) 108 (90 Base) MCG/ACT inhaler Inhale 2 puffs into the lungs every 6 (six) hours as needed for wheezing or shortness of breath.    Marland Kitchen albuterol (PROVENTIL) (2.5 MG/3ML) 0.083% nebulizer solution Take 2.5 mg by nebulization every 6 (six) hours as needed for wheezing or shortness of breath.    . Calcium Carb-Cholecalciferol (CALCIUM 600+D) 600-800 MG-UNIT TABS Take 1 tablet by mouth 2 (two) times daily.     . cetirizine (ZYRTEC) 10 MG tablet Take 10 mg by mouth daily as needed for allergies.     . feeding supplement, ENSURE ENLIVE, (ENSURE  ENLIVE) LIQD Take 237 mLs by mouth 3 (three) times daily between meals. (Patient taking differently: Take 237 mLs by mouth 3 (three) times daily as needed (for poor nutrition.). ) 237 mL 12  . furosemide (LASIX) 20 MG tablet Take 1 tablet (20 mg total) daily by mouth. 90 tablet 3  . gentamicin ointment (GARAMYCIN) 0.1 % Apply 1 application topically 3 (three) times daily as needed (for nose bleeds).     . insulin aspart protamine- aspart (NOVOLOG MIX 70/30) (70-30) 100 UNIT/ML injection Inject 2-4 Units into the skin 2 (two) times daily. 4 units with breakfast & 2 units with supper    . lactulose (CHRONULAC) 10 GM/15ML solution 10cc (2 teaspoons) three times daily; Titrate to 2-3 semi formed stools a day (Patient taking differently: Take 10 g by mouth See admin instructions. 10cc (2 teaspoons) three times daily; Titrate to 2-3 semi formed stools a day) 1892 mL 5  . levothyroxine (LEVOXYL) 50 MCG tablet Take 50 mcg by mouth daily before breakfast.     . metoprolol succinate (TOPROL-XL) 25 MG 24 hr tablet Take 1 tablet (25 mg total) daily by mouth. 90 tablet 3  . modafinil (PROVIGIL) 200 MG tablet Take 200 mg by mouth daily.     . mometasone (NASONEX) 50 MCG/ACT nasal spray Place 2 sprays into the nose daily as needed (for congestion/allergies).     . Multiple Vitamins-Minerals (CENTRUM) tablet Take 1  tablet by mouth daily.     . nitroGLYCERIN (NITROSTAT) 0.4 MG SL tablet Place 1 tablet (0.4 mg total) under the tongue every 5 (five) minutes as needed for chest pain. (Patient taking differently: Place 0.4 mg under the tongue every 5 (five) minutes x 3 doses as needed for chest pain. ) 25 tablet 3  . omeprazole (PRILOSEC) 40 MG capsule TAKE 1 CAPSULE BY MOUTH ONCE DAILY FOR REFLUX. 90 capsule 3  . spironolactone (ALDACTONE) 50 MG tablet Take 1 tablet (50 mg total) daily by mouth. 90 tablet 3  . Tetrahydrozoline HCl (VISINE OP) Place 2 drops into both eyes 2 (two) times daily as needed (for allergies).    .  ursodiol (ACTIGALL) 500 MG tablet TAKE 1 TABLET BY MOUTH TWICE DAILY 60 tablet 5  . XIFAXAN 550 MG TABS tablet TAKE 1 TABLET BY MOUTH TWICE DAILY (Patient taking differently: TAKE 0.5 TABLET BY MOUTH TWICE DAILY) 180 tablet 3   No current facility-administered medications for this visit.      Past Medical History:  Diagnosis Date  . Alcohol use   . Anemia   . Aortic stenosis    mild  . Arthritis   . Atrial flutter ([place])   . Benign hypertrophy of prostate    with urinary retention; repaired hypospadia; transurethral resection of the prostate   . Cardiac conduction disorder    status post pacemaker implantation in 1995 generator replaced 2005  . Chest pain 2007, 2009   cardiac cath > normal coronary arterie; nl left ventricular function  . CHF (congestive heart failure) ([place])   . Cirrhosis ([place]) 11/2016  . Congenital deafness   . COPD (chronic obstructive pulmonary disease) ([place])   . Diabetes mellitus    +Insulin  . Dyspnea   . Edema   . GERD (gastroesophageal reflux disease)    status post multiple dilatations for stricture  . 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 Hepatitis    Thinks it was B  . History of kidney stones   . HOH (hard of hearing)   . HTN (hypertension)   . Hyperlipidemia    statin discontinued due to abn LFTs  . Hypothyroidism   . Mitral regurgitation    insignificant  . Obesity   . Obesity 4/08   BMI= 40  . Pelvic mass    identified in 2009, stable 2010  . PONV (postoperative nausea and vomiting)   . Presence of permanent cardiac pacemaker   . Sleep apnea    BiPAP and continuous oxygen  . Syncope   . Thyroid disease     ROS:   All systems reviewed and negative except as noted in the HPI.   Past Surgical History:  Procedure Laterality Date  . A-V CARDIAC PACEMAKER INSERTION  1995  . COLONOSCOPY  2008  . COLONOSCOPY N/A 06/23/2015   ZOX:WRUEAVW diverticulosis  .  ESOPHAGOGASTRODUODENOSCOPY N/A 06/23/2015   UJW:[id number] portal gastropathy  . IR GENERIC HISTORICAL  12/14/2016   IR PARACENTESIS 12/14/2016 Sandi Mariscal, MD [facility]  . IR GENERIC HISTORICAL  12/14/2016   IR TIPS 12/14/2016 Sandi Mariscal, MD [facility]  . IR PARACENTESIS  01/12/2017  . IR RADIOLOGIST EVAL & MGMT  01/06/2017  . IR RADIOLOGIST EVAL & MGMT  02/08/2017  . IR RADIOLOGIST EVAL & MGMT  11/30/2016  . IR RADIOLOGIST EVAL & MGMT  09/22/2017  . IR TIPS  01/12/2017  . NASAL SINUS SURGERY  partially removed  . ORCHIECTOMY  1990   bilateral; ?neoplasm  . PACEMAKER INSERTION  05/2004; 10/04/2013   MDT dual chamber pacemaker; gen change 10/04/2013 (MDT ADDRL1)  . PERMANENT PACEMAKER GENERATOR CHANGE N/A 10/04/2013   Procedure: PERMANENT PACEMAKER GENERATOR CHANGE;  Surgeon: Evans Lance, MD;  Location[facility] CATH LAB;  Service: Cardiovascular;  Laterality: N/A;  . RADIOLOGY WITH ANESTHESIA N/A 12/14/2016   Procedure: TIPS;  Surgeon: Sandi Mariscal, MD;  Location: [place];  Service: Radiology;  Laterality: N/A;  . RADIOLOGY WITH ANESTHESIA N/A 01/12/2017   Procedure: TIPS;  Surgeon: Sandi Mariscal, MD;  Location: [place];  Service: Radiology;  Laterality: N/A;  . REPAIR HYPOSPADIAS W/ URETHROPLASTY    . TONSILLECTOMY AND ADENOIDECTOMY    . TRANSURETHRAL RESECTION OF PROSTATE     for BPH     Family History  Problem Relation Age of Onset  . COPD Mother   . Hypertension Mother   . Congestive Heart Failure Mother   . Pneumonia Father   . Heart disease Other   . Hypertension Other   . Colon cancer Neg Hx      Social History   Socioeconomic History  . Marital status: Married    Spouse name: Not on file  . Number of children: Not on file  . Years of education: Not on file  . Highest education level: Not on file  Occupational History  . Not on file  Social Needs  . Financial resource strain: Not on file  . Food insecurity:    Worry: Not on file    Inability: Not on file  . Transportation  needs:    Medical: Not on file    Non-medical: Not on file  Tobacco Use  . Smoking status: Never Smoker  . Smokeless tobacco: Never Used  . Tobacco comment: tobacco use- no   Substance and Sexual Activity  . Alcohol use: No    Alcohol/week: 0.0 oz    Comment: Hx. of alcohol use  . Drug use: No  . Sexual activity: Never  Lifestyle  . Physical activity:    Days per week: Not on file    Minutes per session: Not on file  . Stress: Not on file  Relationships  . Social connections:    Talks on phone: Not on file    Gets together: Not on file    Attends religious service: Not on file    Active member of club or organization: Not on file    Attends meetings of clubs or organizations: Not on file    Relationship status: Not on file  . Intimate partner violence:    Fear of current or ex partner: Not on file    Emotionally abused: Not on file    Physically abused: Not on file    Forced sexual activity: Not on file  Other Topics Concern  . Not on file  Social History Narrative   Part time.      BP 102/76   Pulse 86   Ht 5\' 3"  (1.6 m)   Wt 179 lb 12.8 oz (81.6 kg)   SpO2 90%   BMI 31.85 kg/m   Physical Exam:  stable appearing 57 yo man, NAD HEENT: Unremarkable Neck:  No JVD, no thyromegally Lymphatics:  No adenopathy Back:  No CVA tenderness Lungs:  Clear with no wheezes HEART:  Regular rate rhythm, 3/6 systolic murmur, no rubs, no clicks Abd:  soft, positive bowel sounds, no organomegally, no rebound, no guarding Ext:  2 plus pulses, no edema, no cyanosis, no clubbing, left leg wrapped and bandaged with no drainage on bandage. Skin:  No rashes no nodules Neuro:  CN II through XII intact, motor grossly intact  DEVICE  Normal device function.  See PaceArt for details.   Assess/Plan: 1. PAF - interogation of his PPM demonstrates that he is out of rhythm about half the time. He is asymptomatic. He is not a candidate for systemic anti-coagulation due to his propensity  to bleed. 2. PPM - his Medtronic DDD PM is working normally except for his atrial lead which is programmed unipolar. Will recheck. Once he is always in atrial fib, we will reprogram his device VVI. 3. Chronic diastolic heart failure - his symptoms appear to be class 2. He will continue his current meds. 4. Sinus node dysfunction - he is asymptomatic, s/p PPM insertion. 5. AS - he has moderate-severe AS on exam, with a mean gradient a year ago of 26. He appears to be asymptomatic at this time.  Mikle Bosworth.D.

## 2017-12-21 NOTE — Progress Notes (Signed)
HPI  Allergies  Allergen Reactions  . Penicillins Swelling and Other (See Comments)    SWELLING OF AIRWAY  PATIENT HAD A PCN REACTION WITH IMMEDIATE RASH, FACIAL/TONGUE/THROAT SWELLING, SOB, OR LIGHTHEADEDNESS WITH HYPOTENSION:  #  #  #  YES  #  #  #  Has patient had a PCN reaction causing severe rash involving mucus membranes or skin necrosis: No Has patient had a PCN reaction that required hospitalization No Has patient had a PCN reaction occurring within the last 10 years: No  . Enalapril Cough  . Metformin And Related Diarrhea  . Ciprofloxacin     Itching and red rash up arm when infusing after 3rd cipro dose for uti on 10/27/13  . Eliquis [Apixaban] Other (See Comments)    DOSE RELATED BLEEDING     Current Outpatient Medications  Medication Sig Dispense Refill  . acetaminophen (TYLENOL) 325 MG tablet Take 325 mg by mouth every 6 (six) hours as needed for mild pain or headache.     . albuterol (PROVENTIL HFA;VENTOLIN HFA) 108 (90 Base) MCG/ACT inhaler Inhale 2 puffs into the lungs every 6 (six) hours as needed for wheezing or shortness of breath.    Marland Kitchen albuterol (PROVENTIL) (2.5 MG/3ML) 0.083% nebulizer solution Take 2.5 mg by nebulization every 6 (six) hours as needed for wheezing or shortness of breath.    . Calcium Carb-Cholecalciferol (CALCIUM 600+D) 600-800 MG-UNIT TABS Take 1 tablet by mouth 2 (two) times daily.     . cetirizine (ZYRTEC) 10 MG tablet Take 10 mg by mouth daily as needed for allergies.     . feeding supplement, ENSURE ENLIVE, (ENSURE ENLIVE) LIQD Take 237 mLs by mouth 3 (three) times daily between meals. (Patient taking differently: Take 237 mLs by mouth 3 (three) times daily as needed (for poor nutrition.). ) 237 mL 12  . furosemide (LASIX) 20 MG tablet Take 1 tablet (20 mg total) daily by mouth. 90 tablet 3  . gentamicin ointment (GARAMYCIN) 0.1 % Apply 1 application topically 3 (three) times daily as needed (for nose bleeds).     . insulin aspart  protamine- aspart (NOVOLOG MIX 70/30) (70-30) 100 UNIT/ML injection Inject 2-4 Units into the skin 2 (two) times daily. 4 units with breakfast & 2 units with supper    . lactulose (CHRONULAC) 10 GM/15ML solution 10cc (2 teaspoons) three times daily; Titrate to 2-3 semi formed stools a day (Patient taking differently: Take 10 g by mouth See admin instructions. 10cc (2 teaspoons) three times daily; Titrate to 2-3 semi formed stools a day) 1892 mL 5  . levothyroxine (LEVOXYL) 50 MCG tablet Take 50 mcg by mouth daily before breakfast.     . metoprolol succinate (TOPROL-XL) 25 MG 24 hr tablet Take 1 tablet (25 mg total) daily by mouth. 90 tablet 3  . modafinil (PROVIGIL) 200 MG tablet Take 200 mg by mouth daily.     . mometasone (NASONEX) 50 MCG/ACT nasal spray Place 2 sprays into the nose daily as needed (for congestion/allergies).     . Multiple Vitamins-Minerals (CENTRUM) tablet Take 1 tablet by mouth daily.     . nitroGLYCERIN (NITROSTAT) 0.4 MG SL tablet Place 1 tablet (0.4 mg total) under the tongue every 5 (five) minutes as needed for chest pain. (Patient taking differently: Place 0.4 mg under the tongue every 5 (five) minutes x 3 doses as needed for chest pain. ) 25 tablet 3  . omeprazole (PRILOSEC) 40 MG capsule TAKE 1 CAPSULE BY  MOUTH ONCE DAILY FOR REFLUX. 90 capsule 3  . spironolactone (ALDACTONE) 50 MG tablet Take 1 tablet (50 mg total) daily by mouth. 90 tablet 3  . Tetrahydrozoline HCl (VISINE OP) Place 2 drops into both eyes 2 (two) times daily as needed (for allergies).    . ursodiol (ACTIGALL) 500 MG tablet TAKE 1 TABLET BY MOUTH TWICE DAILY 60 tablet 5  . XIFAXAN 550 MG TABS tablet TAKE 1 TABLET BY MOUTH TWICE DAILY (Patient taking differently: TAKE 0.5 TABLET BY MOUTH TWICE DAILY) 180 tablet 3   No current facility-administered medications for this visit.      Past Medical History:  Diagnosis Date  . Alcohol use   . Anemia   . Aortic stenosis    mild  . Arthritis   . Atrial  flutter ([place])   . Benign hypertrophy of prostate    with urinary retention; repaired hypospadia; transurethral resection of the prostate   . Cardiac conduction disorder    status post pacemaker implantation in 1995 generator replaced 2005  . Chest pain 2007, 2009   cardiac cath > normal coronary arterie; nl left ventricular function  . CHF (congestive heart failure) ([place])   . Cirrhosis ([place]) 11/2016  . Congenital deafness   . COPD (chronic obstructive pulmonary disease) ([place])   . Diabetes mellitus    +Insulin  . Dyspnea   . Edema   . GERD (gastroesophageal reflux disease)    status post multiple dilatations for stricture  . 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 Hepatitis    Thinks it was B  . History of kidney stones   . HOH (hard of hearing)   . HTN (hypertension)   . Hyperlipidemia    statin discontinued due to abn LFTs  . Hypothyroidism   . Mitral regurgitation    insignificant  . Obesity   . Obesity 4/08   BMI= 40  . Pelvic mass    identified in 2009, stable 2010  . PONV (postoperative nausea and vomiting)   . Presence of permanent cardiac pacemaker   . Sleep apnea    BiPAP and continuous oxygen  . Syncope   . Thyroid disease     ROS:   All systems reviewed and negative except as noted in the HPI.   Past Surgical History:  Procedure Laterality Date  . A-V CARDIAC PACEMAKER INSERTION  1995  . COLONOSCOPY  2008  . COLONOSCOPY N/A 06/23/2015   ZOX:WRUEAVW diverticulosis  . ESOPHAGOGASTRODUODENOSCOPY N/A 06/23/2015   UJW:JXBJ portal gastropathy  . IR GENERIC HISTORICAL  12/14/2016   IR PARACENTESIS 12/14/2016 [facility]i Mariscal, MD [facility]  . IR GENERIC HISTORICAL  12/14/2016   IR TIPS 12/14/2016 Sandi Mariscal, MD [facility]  . IR PARACENTESIS  01/12/2017  . IR RADIOLOGIST EVAL & MGMT  01/06/2017  . IR RADIOLOGIST EVAL & MGMT  02/08/2017  . IR RADIOLOGIST EVAL & MGMT  11/30/2016  . IR RADIOLOGIST EVAL &  MGMT  09/22/2017  . IR TIPS  01/12/2017  . NASAL SINUS SURGERY     partially removed  . ORCHIECTOMY  1990   bilateral; ?neoplasm  . PACEMAKER INSERTION  05/2004; 10/04/2013   MDT dual chamber pacemaker; gen change 10/04/2013 (MDT ADDRL1)  . PERMANENT PACEMAKER GENERATOR CHANGE N/A 10/04/2013   Procedure: PERMANENT PACEMAKER GENERATOR CHANGE;  Surgeon: Evans Lance, MD;  [facility] CATH LAB;  Service: Cardiovascular;  Laterality: N/A;  . RADIOLOGY WITH  ANESTHESIA N/A 12/14/2016   Procedure: TIPS;  Surgeon: Sandi Mariscal, MD;  Location: [place];  Service: Radiology;  Laterality: N/A;  . RADIOLOGY WITH ANESTHESIA N/A 01/12/2017   Procedure: TIPS;  Surgeon: Sandi Mariscal, MD;  Location: [place];  Service: Radiology;  Laterality: N/A;  . REPAIR HYPOSPADIAS W/ URETHROPLASTY    . TONSILLECTOMY AND ADENOIDECTOMY    . TRANSURETHRAL RESECTION OF PROSTATE     for BPH     Family History  Problem Relation Age of Onset  . COPD Mother   . Hypertension Mother   . Congestive Heart Failure Mother   . Pneumonia Father   . Heart disease Other   . Hypertension Other   . Colon cancer Neg Hx      Social History   Socioeconomic History  . Marital status: Married    Spouse name: Not on file  . Number of children: Not on file  . Years of education: Not on file  . Highest education level: Not on file  Occupational History  . Not on file  Social Needs  . Financial resource strain: Not on file  . Food insecurity:    Worry: Not on file    Inability: Not on file  . Transportation needs:    Medical: Not on file    Non-medical: Not on file  Tobacco Use  . Smoking status: Never Smoker  . Smokeless tobacco: Never Used  . Tobacco comment: tobacco use- no   Substance and Sexual Activity  . Alcohol use: No    Alcohol/week: 0.0 oz    Comment: Hx. of alcohol use  . Drug use: No  . Sexual activity: Never  Lifestyle  . Physical activity:    Days per week: Not on file    Minutes per session: Not on file  .  Stress: Not on file  Relationships  . Social connections:    Talks on phone: Not on file    Gets together: Not on file    Attends religious service: Not on file    Active member of club or organization: Not on file    Attends meetings of clubs or organizations: Not on file    Relationship status: Not on file  . Intimate partner violence:    Fear of current or ex partner: Not on file    Emotionally abused: Not on file    Physically abused: Not on file    Forced sexual activity: Not on file  Other Topics Concern  . Not on file  Social History Narrative   Part time.      BP 102/76   Pulse 86   Ht 5\' 3"  (1.6 m)   Wt 179 lb 12.8 oz (81.6 kg)   SpO2 90%   BMI 31.85 kg/m   Physical Exam:  Well appearing NAD HEENT: Unremarkable Neck:  No JVD, no thyromegally Lymphatics:  No adenopathy Back:  No CVA tenderness Lungs:  Clear HEART:  Regular rate rhythm, no murmurs, no rubs, no clicks Abd:  soft, positive bowel sounds, no organomegally, no rebound, no guarding Ext:  2 plus pulses, no edema, no cyanosis, no clubbing Skin:  No rashes no nodules Neuro:  CN II through XII intact, motor grossly intact  EKG  DEVICE  Normal device function.  See PaceArt for details.   Assess/Plan:

## 2017-12-21 NOTE — Telephone Encounter (Signed)
Pt is scheduled for an EGD tomorrow 12/22/17. Pts spouse wants to know if pts blood work is ok and if it's ok to keep his appt tomorrow.

## 2017-12-21 NOTE — Telephone Encounter (Signed)
RECALL FOR ULTRASOUND 

## 2017-12-21 NOTE — Patient Instructions (Signed)
Medication Instructions:  Your physician recommends that you continue on your current medications as directed. Please refer to the Current Medication list given to you today.   Labwork: NONE   Testing/Procedures: NONE   Follow-Up: Your physician wants you to follow-up in: 1 Year with Dr. Lovena Le. You will receive a reminder letter in the mail two months in advance. If you don't receive a letter, please call our office to schedule the follow-up appointment.  Remote monitoring is used to monitor your Pacemaker of ICD from home. This monitoring reduces the number of office visits required to check your device to one time per year. It allows [name] to keep an eye on the functioning of your device to ensure it is working properly. You are scheduled for a device check from home on 03/13/18. You may send your transmission at any time that day. If you have a wireless device, the transmission will be sent automatically. After your physician reviews your transmission, you will receive a postcard with your next transmission date.     Any Other Special Instructions Will Be Listed Below (If Applicable).     If you need a refill on your cardiac medications before your next appointment, please call your pharmacy. Thank you for choosing [facility]!

## 2017-12-22 ENCOUNTER — Encounter (HOSPITAL_COMMUNITY): Payer: Self-pay | Admitting: *Deleted

## 2017-12-22 ENCOUNTER — Ambulatory Visit (HOSPITAL_COMMUNITY): Payer: Medicare HMO | Admitting: Anesthesiology

## 2017-12-22 ENCOUNTER — Ambulatory Visit (HOSPITAL_COMMUNITY)
Admission: RE | Admit: 2017-12-22 | Discharge: 2017-12-22 | Disposition: A | Discharge status: Home / Self Care | Payer: Medicare HMO | Source: Ambulatory Visit | Attending: Internal Medicine | Admitting: Internal Medicine

## 2017-12-22 ENCOUNTER — Encounter (HOSPITAL_COMMUNITY)
Admission: RE | Disposition: A | Discharge status: Home / Self Care | Payer: Self-pay | Source: Ambulatory Visit | Attending: Internal Medicine

## 2017-12-22 DIAGNOSIS — Z87442 Personal history of urinary calculi: Secondary | ICD-10-CM | POA: Insufficient documentation

## 2017-12-22 DIAGNOSIS — I080 Rheumatic disorders of both mitral and aortic valves: Secondary | ICD-10-CM | POA: Insufficient documentation

## 2017-12-22 DIAGNOSIS — K3189 Other diseases of stomach and duodenum: Secondary | ICD-10-CM | POA: Insufficient documentation

## 2017-12-22 DIAGNOSIS — I509 Heart failure, unspecified: Secondary | ICD-10-CM | POA: Insufficient documentation

## 2017-12-22 DIAGNOSIS — Z880 Allergy status to penicillin: Secondary | ICD-10-CM | POA: Diagnosis not present

## 2017-12-22 DIAGNOSIS — H905 Unspecified sensorineural hearing loss: Secondary | ICD-10-CM | POA: Insufficient documentation

## 2017-12-22 DIAGNOSIS — I110 Hypertensive heart disease with heart failure: Secondary | ICD-10-CM | POA: Insufficient documentation

## 2017-12-22 DIAGNOSIS — Z888 Allergy status to other drugs, medicaments and biological substances status: Secondary | ICD-10-CM | POA: Diagnosis not present

## 2017-12-22 DIAGNOSIS — K7460 Unspecified cirrhosis of liver: Secondary | ICD-10-CM

## 2017-12-22 DIAGNOSIS — Z950 Presence of cardiac pacemaker: Secondary | ICD-10-CM | POA: Insufficient documentation

## 2017-12-22 DIAGNOSIS — E1151 Type 2 diabetes mellitus with diabetic peripheral angiopathy without gangrene: Secondary | ICD-10-CM | POA: Diagnosis not present

## 2017-12-22 DIAGNOSIS — E119 Type 2 diabetes mellitus without complications: Secondary | ICD-10-CM | POA: Diagnosis not present

## 2017-12-22 DIAGNOSIS — E785 Hyperlipidemia, unspecified: Secondary | ICD-10-CM | POA: Diagnosis not present

## 2017-12-22 DIAGNOSIS — M1990 Unspecified osteoarthritis, unspecified site: Secondary | ICD-10-CM | POA: Diagnosis not present

## 2017-12-22 DIAGNOSIS — I739 Peripheral vascular disease, unspecified: Secondary | ICD-10-CM

## 2017-12-22 DIAGNOSIS — G4730 Sleep apnea, unspecified: Secondary | ICD-10-CM | POA: Diagnosis not present

## 2017-12-22 DIAGNOSIS — Z881 Allergy status to other antibiotic agents status: Secondary | ICD-10-CM | POA: Insufficient documentation

## 2017-12-22 DIAGNOSIS — Z6831 Body mass index (BMI) 31.0-31.9, adult: Secondary | ICD-10-CM | POA: Insufficient documentation

## 2017-12-22 DIAGNOSIS — Z794 Long term (current) use of insulin: Secondary | ICD-10-CM | POA: Insufficient documentation

## 2017-12-22 DIAGNOSIS — I5032 Chronic diastolic (congestive) heart failure: Secondary | ICD-10-CM

## 2017-12-22 DIAGNOSIS — J449 Chronic obstructive pulmonary disease, unspecified: Secondary | ICD-10-CM

## 2017-12-22 DIAGNOSIS — E039 Hypothyroidism, unspecified: Secondary | ICD-10-CM | POA: Diagnosis not present

## 2017-12-22 DIAGNOSIS — K219 Gastro-esophageal reflux disease without esophagitis: Secondary | ICD-10-CM | POA: Diagnosis not present

## 2017-12-22 DIAGNOSIS — K766 Portal hypertension: Secondary | ICD-10-CM | POA: Diagnosis not present

## 2017-12-22 DIAGNOSIS — K7581 Nonalcoholic steatohepatitis (NASH): Secondary | ICD-10-CM | POA: Insufficient documentation

## 2017-12-22 DIAGNOSIS — E440 Moderate protein-calorie malnutrition: Secondary | ICD-10-CM

## 2017-12-22 DIAGNOSIS — E669 Obesity, unspecified: Secondary | ICD-10-CM | POA: Diagnosis not present

## 2017-12-22 DIAGNOSIS — J9611 Chronic respiratory failure with hypoxia: Secondary | ICD-10-CM

## 2017-12-22 DIAGNOSIS — Z79899 Other long term (current) drug therapy: Secondary | ICD-10-CM | POA: Insufficient documentation

## 2017-12-22 DIAGNOSIS — Z8619 Personal history of other infectious and parasitic diseases: Secondary | ICD-10-CM | POA: Diagnosis not present

## 2017-12-22 DIAGNOSIS — I4892 Unspecified atrial flutter: Secondary | ICD-10-CM | POA: Diagnosis not present

## 2017-12-22 DIAGNOSIS — K7290 Hepatic failure, unspecified without coma: Secondary | ICD-10-CM

## 2017-12-22 HISTORY — PX: ESOPHAGEAL BANDING: SHX5518

## 2017-12-22 HISTORY — PX: ESOPHAGOGASTRODUODENOSCOPY (EGD) WITH PROPOFOL: SHX5813

## 2017-12-22 LAB — GLUCOSE, CAPILLARY
GLUCOSE-CAPILLARY: 80 mg/dL (ref 65–99)
Glucose-Capillary: 92 mg/dL (ref 65–99)

## 2017-12-22 SURGERY — ESOPHAGOGASTRODUODENOSCOPY (EGD) WITH PROPOFOL
Anesthesia: Monitor Anesthesia Care

## 2017-12-22 MED ORDER — CHLORHEXIDINE GLUCONATE CLOTH 2 % EX PADS: 6.0000 | MEDICATED_PAD | Freq: Once | CUTANEOUS | Status: DC

## 2017-12-22 MED ORDER — PROPOFOL 10 MG/ML IV BOLUS
INTRAVENOUS | Status: DC | PRN
  Administered 2017-12-22 (×2): 20 mg via INTRAVENOUS

## 2017-12-22 MED ORDER — MIDAZOLAM HCL 2 MG/2ML IJ SOLN
0.5000 mg | INTRAMUSCULAR | Status: DC | PRN
  Administered 2017-12-22 (×2): 1 mg via INTRAVENOUS

## 2017-12-22 MED ORDER — HYDROCODONE-ACETAMINOPHEN 7.5-325 MG PO TABS: 1.0000 | ORAL_TABLET | Freq: Once | ORAL | Status: DC | PRN

## 2017-12-22 MED ORDER — PROPOFOL 10 MG/ML IV BOLUS
INTRAVENOUS | Status: AC
  Filled 2017-12-22: qty 80

## 2017-12-22 MED ORDER — LIDOCAINE VISCOUS 2 % MT SOLN
5.0000 mL | Freq: Two times a day (BID) | OROMUCOSAL | Status: AC
  Administered 2017-12-22 (×2): 5 mL via OROMUCOSAL

## 2017-12-22 MED ORDER — PROPOFOL 10 MG/ML IV BOLUS
INTRAVENOUS | Status: AC
Start: 2017-12-22 — End: ?
  Filled 2017-12-22: qty 80

## 2017-12-22 MED ORDER — LACTATED RINGERS IV SOLN
INTRAVENOUS | Status: DC
  Administered 2017-12-22: 1000 mL via INTRAVENOUS

## 2017-12-22 MED ORDER — ACETAMINOPHEN 10 MG/ML IV SOLN: 1000.0000 mg | Freq: Once | INTRAVENOUS | Status: DC | PRN

## 2017-12-22 MED ORDER — PROPOFOL 500 MG/50ML IV EMUL
INTRAVENOUS | Status: DC | PRN
  Administered 2017-12-22: 125 ug/kg/min via INTRAVENOUS

## 2017-12-22 MED ORDER — PROMETHAZINE HCL 25 MG/ML IJ SOLN: 6.2500 mg | INTRAMUSCULAR | Status: DC | PRN

## 2017-12-22 MED ORDER — PROPOFOL 10 MG/ML IV BOLUS
INTRAVENOUS | Status: AC
  Filled 2017-12-22: qty 40

## 2017-12-22 MED ORDER — LIDOCAINE VISCOUS 2 % MT SOLN
OROMUCOSAL | Status: AC
  Filled 2017-12-22: qty 15

## 2017-12-22 MED ORDER — HYDROMORPHONE HCL 1 MG/ML IJ SOLN: 0.2500 mg | INTRAMUSCULAR | Status: DC | PRN

## 2017-12-22 MED ORDER — MIDAZOLAM HCL 2 MG/2ML IJ SOLN
INTRAMUSCULAR | Status: AC
  Filled 2017-12-22: qty 2

## 2017-12-22 MED ORDER — MEPERIDINE HCL 100 MG/ML IJ SOLN: 6.2500 mg | INTRAMUSCULAR | Status: DC | PRN

## 2017-12-22 NOTE — Transfer of Care (Signed)
Immediate Anesthesia Transfer of Care Note  Patient: [name]  Procedure(s) Performed: ESOPHAGOGASTRODUODENOSCOPY (EGD) WITH PROPOFOL (N/A ) ESOPHAGEAL BANDING (N/A )  Patient Location: PACU  Anesthesia Type:MAC  Level of Consciousness: awake and patient cooperative  Airway & Oxygen Therapy: Patient Spontanous Breathing and non-rebreather face mask  Post-op Assessment: Report given to RN and Post -op Vital signs reviewed and stable  Post vital signs: Reviewed and stable  Last Vitals:  Vitals Value Taken Time  BP    Temp    Pulse    Resp    SpO2      Last Pain:  Vitals:   12/22/17 0650  TempSrc: Oral  PainSc: 0-No pain      Patients Stated Pain Goal: 9 (25/85/27 7824)  Complications: No apparent anesthesia complications

## 2017-12-22 NOTE — Anesthesia Preprocedure Evaluation (Signed)
Anesthesia Evaluation  Patient identified by MRN, date of birth, ID band  Reviewed: Allergy & Precautions, NPO status , Patient's Chart, lab work & pertinent test results  History of Anesthesia Complications (+) PONV  Airway Mallampati: II  TM Distance: >3 FB Neck ROM: Full    Dental no notable dental hx. (+) Poor Dentition, Missing   Pulmonary shortness of breath, sleep apnea , COPD,    Pulmonary exam normal breath sounds clear to auscultation       Cardiovascular hypertension, + Peripheral Vascular Disease and +CHF  Normal cardiovascular exam+ dysrhythmias + pacemaker  Rhythm:Regular Rate:Normal     Neuro/Psych HOH    GI/Hepatic GERD  ,(+) Hepatitis -  Endo/Other  diabetesHypothyroidism   Renal/GU Renal disease     Musculoskeletal  (+) Arthritis ,   Abdominal   Peds  Hematology  (+) anemia ,   Anesthesia Other Findings   Reproductive/Obstetrics                             Anesthesia Physical Anesthesia Plan  ASA: III  Anesthesia Plan: MAC   Post-op Pain Management:    Induction: Intravenous  PONV Risk Score and Plan:   Airway Management Planned: Nasal Cannula  Additional Equipment:   Intra-op Plan:   Post-operative Plan: Extubation in OR  Informed Consent: I have reviewed the patients History and Physical, chart, labs and discussed the procedure including the risks, benefits and alternatives for the proposed anesthesia with the patient or authorized representative who has indicated his/her understanding and acceptance.   Dental advisory given  Plan Discussed with: CRNA  Anesthesia Plan Comments:         Anesthesia Quick Evaluation

## 2017-12-22 NOTE — Progress Notes (Addendum)
Dictation Number [id number]. Provation out of service.

## 2017-12-22 NOTE — Anesthesia Postprocedure Evaluation (Signed)
Anesthesia Post Note  Patient: [name]  Procedure(s) Performed: ESOPHAGOGASTRODUODENOSCOPY (EGD) WITH PROPOFOL (N/A ) ESOPHAGEAL BANDING (N/A )  Patient location during evaluation: PACU Anesthesia Type: MAC Level of consciousness: awake and alert and patient cooperative Pain management: satisfactory to patient Vital Signs Assessment: post-procedure vital signs reviewed and stable Respiratory status: spontaneous breathing Cardiovascular status: stable Postop Assessment: no apparent nausea or vomiting Anesthetic complications: no     Last Vitals:  Vitals:   12/22/17 0830 12/22/17 0837  BP: 126/76 132/80  Pulse: 81 82  Resp: (!) 22 20  Temp:  36.4 C  SpO2: 92% 92%    Last Pain:  Vitals:   12/22/17 0837  TempSrc: Oral  PainSc: 0-No pain                 Sherilee Smotherman

## 2017-12-22 NOTE — H&P (Signed)
@LOGO @   Primary Care Physician:  Petra Kuba, MD Primary Gastroenterologist:  Dr. Gala Romney  Pre-Procedure History & Physical: HPI:  [name] is a 57 y.o. male here for EGD screening for varices.  None found in 2016.  Past Medical History:  Diagnosis Date  . Alcohol use   . Anemia   . Aortic stenosis    mild  . Arthritis   . Atrial flutter ([place])   . Benign hypertrophy of prostate    with urinary retention; repaired hypospadia; transurethral resection of the prostate   . Cardiac conduction disorder    status post pacemaker implantation in 1995 generator replaced 2005  . Chest pain 2007, 2009   cardiac cath > normal coronary arterie; nl left ventricular function  . CHF (congestive heart failure) ([place])   . Cirrhosis ([place]) 11/2016  . Congenital deafness   . COPD (chronic obstructive pulmonary disease) ([place])   . Diabetes mellitus    +Insulin  . Dyspnea   . Edema   . GERD (gastroesophageal reflux disease)    status post multiple dilatations for stricture  . 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 Hepatitis    Thinks it was B  . History of kidney stones   . HOH (hard of hearing)   . HTN (hypertension)   . Hyperlipidemia    statin discontinued due to abn LFTs  . Hypothyroidism   . Mitral regurgitation    insignificant  . Obesity   . Obesity 4/08   BMI= 40  . Pelvic mass    identified in 2009, stable 2010  . PONV (postoperative nausea and vomiting)   . Presence of permanent cardiac pacemaker   . Sleep apnea    BiPAP and continuous oxygen  . Syncope   . Thyroid disease     Past Surgical History:  Procedure Laterality Date  . A-V CARDIAC PACEMAKER INSERTION  1995  . COLONOSCOPY  2008  . COLONOSCOPY N/A 06/23/2015   LPF:[id number] diverticulosis  . ESOPHAGOGASTRODUODENOSCOPY N/A 06/23/2015   BDZ:HGDJ portal gastropathy  . IR GENERIC HISTORICAL  12/14/2016   IR PARACENTESIS 12/14/2016 Sandi Mariscal,  MD [facility]  . IR GENERIC HISTORICAL  12/14/2016   IR TIPS 12/14/2016 Sandi Mariscal, MD [facility]  . IR PARACENTESIS  01/12/2017  . IR RADIOLOGIST EVAL & MGMT  01/06/2017  . IR RADIOLOGIST EVAL & MGMT  02/08/2017  . IR RADIOLOGIST EVAL & MGMT  11/30/2016  . IR RADIOLOGIST EVAL & MGMT  09/22/2017  . IR TIPS  01/12/2017  . NASAL SINUS SURGERY     partially removed  . ORCHIECTOMY  1990   bilateral; ?neoplasm  . PACEMAKER INSERTION  05/2004; 10/04/2013   MDT dual chamber pacemaker; gen change 10/04/2013 (MDT ADDRL1)  . PERMANENT PACEMAKER GENERATOR CHANGE N/A 10/04/2013   Procedure: PERMANENT PACEMAKER GENERATOR CHANGE;  Surgeon: Evans Lance, MD;  Location: [facility] CATH LAB;  Service: Cardiovascular;  Laterality: N/A;  . RADIOLOGY WITH ANESTHESIA N/A 12/14/2016   Procedure: TIPS;  Surgeon: Sandi Mariscal, MD;  Location: [facility];  Service: Radiology;  Laterality: N/A;  . RADIOLOGY WITH ANESTHESIA N/A 01/12/2017   Procedure: TIPS;  Surgeon: Sandi Mariscal, MD;  Location: [facility];  Service: Radiology;  Laterality: N/A;  . REPAIR HYPOSPADIAS W/ URETHROPLASTY    . TONSILLECTOMY AND ADENOIDECTOMY    . TRANSURETHRAL RESECTION OF PROSTATE     for BPH    Prior to Admission  medications   Medication Sig Start Date End Date Taking? Authorizing Provider  acetaminophen (TYLENOL) 325 MG tablet Take 325 mg by mouth every 6 (six) hours as needed for mild pain or headache.    Yes [provider]  Calcium Carb-Cholecalciferol (CALCIUM 600+D) 600-800 MG-UNIT TABS Take 1 tablet by mouth 2 (two) times daily.    Yes [provider]  cetirizine (ZYRTEC) 10 MG tablet Take 10 mg by mouth daily as needed for allergies.    Yes [provider]  feeding supplement, ENSURE ENLIVE, (ENSURE ENLIVE) LIQD Take 237 mLs by mouth 3 (three) times daily between meals. Patient taking differently: Take 237 mLs by mouth 3 (three) times daily as needed (for poor nutrition.).  04/19/17  Yes Dhungel, Nishant, MD  furosemide  (LASIX) 20 MG tablet Take 1 tablet (20 mg total) daily by mouth. 08/04/17  Yes Branch, Alphonse Guild, MD  gentamicin ointment (GARAMYCIN) 0.1 % Apply 1 application topically 3 (three) times daily as needed (for nose bleeds).    Yes [provider]  insulin aspart protamine- aspart (NOVOLOG MIX 70/30) (70-30) 100 UNIT/ML injection Inject 2-4 Units into the skin 2 (two) times daily. 4 units with breakfast & 2 units with supper   Yes [provider]  lactulose (CHRONULAC) 10 GM/15ML solution 10cc (2 teaspoons) three times daily; Titrate to 2-3 semi formed stools a day Patient taking differently: Take 10 g by mouth See admin instructions. 10cc (2 teaspoons) three times daily; Titrate to 2-3 semi formed stools a day 05/25/17  Yes Annitta Needs, NP  levothyroxine (LEVOXYL) 50 MCG tablet Take 50 mcg by mouth daily before breakfast.    Yes [provider]  metoprolol succinate (TOPROL-XL) 25 MG 24 hr tablet Take 1 tablet (25 mg total) daily by mouth. 08/04/17  Yes Branch, Alphonse Guild, MD  modafinil (PROVIGIL) 200 MG tablet Take 200 mg by mouth daily.  01/13/15  Yes [provider]  mometasone (NASONEX) 50 MCG/ACT nasal spray Place 2 sprays into the nose daily as needed (for congestion/allergies).    Yes [provider]  Multiple Vitamins-Minerals (CENTRUM) tablet Take 1 tablet by mouth daily.    Yes [provider]  omeprazole (PRILOSEC) 40 MG capsule TAKE 1 CAPSULE BY MOUTH ONCE DAILY FOR REFLUX. 12/21/17  Yes Branch, Alphonse Guild, MD  spironolactone (ALDACTONE) 50 MG tablet Take 1 tablet (50 mg total) daily by mouth. 08/04/17  Yes Branch, Alphonse Guild, MD  ursodiol (ACTIGALL) 500 MG tablet TAKE 1 TABLET BY MOUTH TWICE DAILY 12/09/17  Yes Carlis Stable, NP  XIFAXAN 550 MG TABS tablet TAKE 1 TABLET BY MOUTH TWICE DAILY Patient taking differently: TAKE 0.5 TABLET BY MOUTH TWICE DAILY 08/31/17  Yes Annitta Needs, NP  albuterol (PROVENTIL HFA;VENTOLIN HFA) 108 (90 Base)  MCG/ACT inhaler Inhale 2 puffs into the lungs every 6 (six) hours as needed for wheezing or shortness of breath.    [provider]  albuterol (PROVENTIL) (2.5 MG/3ML) 0.083% nebulizer solution Take 2.5 mg by nebulization every 6 (six) hours as needed for wheezing or shortness of breath.    [provider]  nitroGLYCERIN (NITROSTAT) 0.4 MG SL tablet Place 1 tablet (0.4 mg total) under the tongue every 5 (five) minutes as needed for chest pain. Patient taking differently: Place 0.4 mg under the tongue every 5 (five) minutes x 3 doses as needed for chest pain.  06/26/15   Lendon Colonel, NP  Tetrahydrozoline HCl (VISINE OP) Place 2 drops into  both eyes 2 (two) times daily as needed (for allergies).    [provider]    Allergies as of 11/10/2017 - Review Complete 11/10/2017  Allergen Reaction Noted  . Penicillins Swelling and Other (See Comments)   . Enalapril Cough 04/15/2011  . Metformin and related Diarrhea 07/30/2013  . Ciprofloxacin  10/27/2013  . Eliquis [apixaban] Other (See Comments) 03/18/2014    Family History  Problem Relation Age of Onset  . COPD Mother   . Hypertension Mother   . Congestive Heart Failure Mother   . Pneumonia Father   . Heart disease Other   . Hypertension Other   . Colon cancer Neg Hx     Social History   Socioeconomic History  . Marital status: Married    Spouse name: Not on file  . Number of children: Not on file  . Years of education: Not on file  . Highest education level: Not on file  Occupational History  . Not on file  Social Needs  . Financial resource strain: Not on file  . Food insecurity:    Worry: Not on file    Inability: Not on file  . Transportation needs:    Medical: Not on file    Non-medical: Not on file  Tobacco Use  . Smoking status: Never Smoker  . Smokeless tobacco: Never Used  . Tobacco comment: tobacco use- no   Substance and Sexual Activity  . Alcohol use: No    Alcohol/week: 0.0 oz     Comment: Hx. of alcohol use  . Drug use: No  . Sexual activity: Never  Lifestyle  . Physical activity:    Days per week: Not on file    Minutes per session: Not on file  . Stress: Not on file  Relationships  . Social connections:    Talks on phone: Not on file    Gets together: Not on file    Attends religious service: Not on file    Active member of club or organization: Not on file    Attends meetings of clubs or organizations: Not on file    Relationship status: Not on file  . Intimate partner violence:    Fear of current or ex partner: Not on file    Emotionally abused: Not on file    Physically abused: Not on file    Forced sexual activity: Not on file  Other Topics Concern  . Not on file  Social History Narrative   Part time.     Review of Systems: See HPI, otherwise negative ROS  Physical Exam: BP 128/89   Pulse 86   Temp (!) 97.5 F (36.4 C) (Oral)   Resp (!) 22   Ht 5\' 3"  (1.6 m)   Wt 179 lb (81.2 kg)   SpO2 95%   BMI 31.71 kg/m  General:   Alert,  Well-developed, well-nourished, pleasant and cooperative in NAD Mouth:  No deformity or lesions. Neck:  Supple; no masses or thyromegaly. No significant cervical adenopathy. Lungs:  Clear throughout to auscultation.   No wheezes, crackles, or rhonchi. No acute distress. Heart:  Regular rate and rhythm; no murmurs, clicks, rubs,  or gallops. Abdomen: Non-distended, normal bowel sounds.  Soft and nontender without appreciable mass or hepatosplenomegaly.  Pulses:  Normal pulses noted. Extremities:  Without clubbing or edema.  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   Recommendations:  I have offered the pt a screening EGD.  The risks, benefits, limitations,  alternatives and imponderables have been reviewed with the patient. Potential for esophageal dilation, biopsy, etc. have also been reviewed.  Questions have been answered. All parties agreeable.    Notice: This dictation  was prepared with Dragon dictation along with smaller phrase technology. Any transcriptional errors that result from this process are unintentional and may not be corrected upon review.

## 2017-12-22 NOTE — Op Note (Signed)
NAME:  [name], [name]                   ACCOUNT NO.:  MEDICAL RECORD NO.:  [id number]  LOCATION:                                 FACILITY:  PHYSICIAN:  R. Garfield Cornea, MD [place]:  12-03-1960  DATE OF PROCEDURE:  12/22/2017 DATE OF DISCHARGE:                              OPERATIVE REPORT   PROCEDURE:  Screening EGD.  INDICATIONS FOR PROCEDURE:  A 57 year old gentleman with NASH, here for a screening EGD.  No varices in 2016 EGD.  The risks, benefits, limitations, alternatives, and imponderables have been reviewed with the patient and the patient's family, all parties agreeable.  PROCEDURE NOTE:  O2 saturation, blood pressure, pulse, and respirations were monitored throughout the entire procedure.  Propofol sedation utilized by [facility].  FINDINGS:  Examination of tubular esophagus revealed normal-appearing esophageal mucosa with no esophageal varices seen.  Stomach:  Stomach was empty.  The patient had a snake skin or fish scale appearance of the gastric mucosa consistent with portal gastropathy, otherwise no other abnormalities were seen.  Patent pylorus.  Examination of the bulb and second portion revealed no abnormalities.  THERAPEUTIC/DIAGNOSTIC MANEUVERS PERFORMED:  None.  The patient tolerated the procedure well and was taken to PACU in stable condition.  IMPRESSION:  Portal gastropathy.  No esophageal varices.  RECOMMENDATIONS: 1. Follow up in the office as scheduled. 2. Repeat screening EGD in 2 years.     Bridgette Habermann, MD FACP [facility]    RMR/MEDQ  D:  12/22/2017  T:  12/22/2017  Job:  [id number]  cc:   Sena Hitch, M.D.

## 2017-12-22 NOTE — Progress Notes (Signed)
Abrasion noted to right lower lip. Vaseline applied.

## 2017-12-22 NOTE — Anesthesia Procedure Notes (Signed)
Procedure Name: MAC Date/Time: 12/22/2017 7:44 AM Performed by: Vista Deck, CRNA Pre-anesthesia Checklist: Patient identified, Emergency Drugs available, Suction available, Timeout performed and Patient being monitored Patient Re-evaluated:Patient Re-evaluated prior to induction Oxygen Delivery Method: Non-rebreather mask

## 2017-12-22 NOTE — Telephone Encounter (Signed)
Thanks. If we can get the documentation on why it was denied, I can draft a letter.

## 2017-12-22 NOTE — Telephone Encounter (Signed)
Called [employer] and spoke with a  Rep. Pt was denied PT assistance on 12/13/17. They only sent the denial info to the pt. An appeal can be made but a letter must be submitted along with the previous application stating why pt needs this medication.

## 2017-12-22 NOTE — Telephone Encounter (Signed)
They said it didn't state why it was denied when asked. They want a reason why we should be appealing only. [employer] company has just changed. [facility] [place]ther was where the patient assistance forms were sent but a new company will handle the appeal.

## 2017-12-22 NOTE — Discharge Instructions (Signed)
EGD Discharge instructions Please read the instructions outlined below and refer to this sheet in the next few weeks. These discharge instructions provide you with general information on caring for yourself after you leave the hospital. Your doctor may also give you specific instructions. While your treatment has been planned according to the most current medical practices available, unavoidable complications occasionally occur. If you have any problems or questions after discharge, please call your doctor. ACTIVITY  You may resume your regular activity but move at a slower pace for the next 24 hours.   Take frequent rest periods for the next 24 hours.   Walking will help expel (get rid of) the air and reduce the bloated feeling in your abdomen.   No driving for 24 hours (because of the anesthesia (medicine) used during the test).   You may shower.   Do not sign any important legal documents or operate any machinery for 24 hours (because of the anesthesia used during the test).  NUTRITION  Drink plenty of fluids.   You may resume your normal diet.   Begin with a light meal and progress to your normal diet.   Avoid alcoholic beverages for 24 hours or as instructed by your caregiver.  MEDICATIONS  You may resume your normal medications unless your caregiver tells you otherwise.  WHAT YOU CAN EXPECT TODAY  You may experience abdominal discomfort such as a feeling of fullness or gas pains.  FOLLOW-UP  Your doctor will discuss the results of your test with you.  SEEK IMMEDIATE MEDICAL ATTENTION IF ANY OF THE FOLLOWING OCCUR:  Excessive nausea (feeling sick to your stomach) and/or vomiting.   Severe abdominal pain and distention (swelling).   Trouble swallowing.   Temperature over 101 F (37.8 C).   Rectal bleeding or vomiting of blood.    Office visit in 3 months   Repeat EGD in 2 years   PATIENT MAY RESUME USUAL MEDICATIONS COMPUTER DOWN FOR RECONCILLIATION OF  MEDICATIONS PER DR. Gala Romney      PATIENT INSTRUCTIONS POST-ANESTHESIA  IMMEDIATELY FOLLOWING SURGERY:  Do not drive or operate machinery for the first twenty four hours after surgery.  Do not make any important decisions for twenty four hours after surgery or while taking narcotic pain medications or sedatives.  If you develop intractable nausea and vomiting or a severe headache please notify your doctor immediately.  FOLLOW-UP:  Please make an appointment with your surgeon as instructed. You do not need to follow up with anesthesia unless specifically instructed to do so.  WOUND CARE INSTRUCTIONS (if applicable):  Keep a dry clean dressing on the anesthesia/puncture wound site if there is drainage.  Once the wound has quit draining you may leave it open to air.  Generally you should leave the bandage intact for twenty four hours unless there is drainage.  If the epidural site drains for more than 36-48 hours please call the anesthesia department.  QUESTIONS?:  Please feel free to call your physician or the hospital operator if you have any questions, and they will be happy to assist you.

## 2017-12-23 ENCOUNTER — Other Ambulatory Visit: Payer: Self-pay

## 2017-12-23 DIAGNOSIS — K7460 Unspecified cirrhosis of liver: Secondary | ICD-10-CM

## 2017-12-23 DIAGNOSIS — R188 Other ascites: Principal | ICD-10-CM

## 2017-12-27 ENCOUNTER — Telehealth: Payer: Self-pay

## 2017-12-27 NOTE — Telephone Encounter (Signed)
Xifaxan 550mg  has been denied by Patient Assistance [employer]. I tired to get a tier exception done his morning and it has been denied as well.

## 2017-12-28 ENCOUNTER — Encounter (HOSPITAL_COMMUNITY): Payer: Self-pay | Admitting: Internal Medicine

## 2017-12-28 ENCOUNTER — Telehealth: Payer: Self-pay | Admitting: Internal Medicine

## 2017-12-28 NOTE — Telephone Encounter (Signed)
U/s recall from 07/2017. Patient had a CT abd/pelvis 10/02/17. Please advise [name] if u/s is still needed?

## 2017-12-28 NOTE — Telephone Encounter (Signed)
No. Due again in July

## 2017-12-28 NOTE — Telephone Encounter (Signed)
Pt's wife called to schedule U/S. Please call [phone]

## 2017-12-28 NOTE — Telephone Encounter (Signed)
Spouse aware. Stacey please NIC U/S for July. Thanks

## 2017-12-29 NOTE — Telephone Encounter (Signed)
ON RECALL  °

## 2018-01-09 ENCOUNTER — Inpatient Hospital Stay: Payer: Medicare HMO

## 2018-01-09 ENCOUNTER — Encounter: Payer: Self-pay | Admitting: Gastroenterology

## 2018-01-09 ENCOUNTER — Inpatient Hospital Stay: Payer: Medicare HMO | Admitting: Anesthesiology

## 2018-01-09 ENCOUNTER — Encounter: Payer: Self-pay | Admitting: Medical Oncology

## 2018-01-09 ENCOUNTER — Inpatient Hospital Stay
Admission: EM | Admit: 2018-01-09 | Discharge: 2018-01-18 | DRG: 871 | Disposition: E | Payer: Medicare HMO | Attending: Internal Medicine | Admitting: Internal Medicine

## 2018-01-09 ENCOUNTER — Encounter: Admission: EM | Disposition: E | Payer: Self-pay | Source: Home / Self Care | Attending: Internal Medicine

## 2018-01-09 ENCOUNTER — Emergency Department: Payer: Medicare HMO

## 2018-01-09 DIAGNOSIS — I080 Rheumatic disorders of both mitral and aortic valves: Secondary | ICD-10-CM | POA: Diagnosis present

## 2018-01-09 DIAGNOSIS — K219 Gastro-esophageal reflux disease without esophagitis: Secondary | ICD-10-CM | POA: Diagnosis present

## 2018-01-09 DIAGNOSIS — Z880 Allergy status to penicillin: Secondary | ICD-10-CM

## 2018-01-09 DIAGNOSIS — J189 Pneumonia, unspecified organism: Secondary | ICD-10-CM | POA: Diagnosis present

## 2018-01-09 DIAGNOSIS — J441 Chronic obstructive pulmonary disease with (acute) exacerbation: Secondary | ICD-10-CM | POA: Diagnosis present

## 2018-01-09 DIAGNOSIS — I248 Other forms of acute ischemic heart disease: Secondary | ICD-10-CM | POA: Diagnosis present

## 2018-01-09 DIAGNOSIS — K421 Umbilical hernia with gangrene: Secondary | ICD-10-CM

## 2018-01-09 DIAGNOSIS — R188 Other ascites: Secondary | ICD-10-CM | POA: Diagnosis present

## 2018-01-09 DIAGNOSIS — J449 Chronic obstructive pulmonary disease, unspecified: Secondary | ICD-10-CM | POA: Diagnosis present

## 2018-01-09 DIAGNOSIS — N400 Enlarged prostate without lower urinary tract symptoms: Secondary | ICD-10-CM | POA: Diagnosis present

## 2018-01-09 DIAGNOSIS — Z79890 Hormone replacement therapy (postmenopausal): Secondary | ICD-10-CM

## 2018-01-09 DIAGNOSIS — Z7189 Other specified counseling: Secondary | ICD-10-CM | POA: Diagnosis not present

## 2018-01-09 DIAGNOSIS — E669 Obesity, unspecified: Secondary | ICD-10-CM | POA: Diagnosis present

## 2018-01-09 DIAGNOSIS — Z888 Allergy status to other drugs, medicaments and biological substances status: Secondary | ICD-10-CM

## 2018-01-09 DIAGNOSIS — K759 Inflammatory liver disease, unspecified: Secondary | ICD-10-CM | POA: Diagnosis present

## 2018-01-09 DIAGNOSIS — E785 Hyperlipidemia, unspecified: Secondary | ICD-10-CM | POA: Diagnosis present

## 2018-01-09 DIAGNOSIS — H905 Unspecified sensorineural hearing loss: Secondary | ICD-10-CM | POA: Diagnosis present

## 2018-01-09 DIAGNOSIS — I739 Peripheral vascular disease, unspecified: Secondary | ICD-10-CM | POA: Diagnosis present

## 2018-01-09 DIAGNOSIS — Z950 Presence of cardiac pacemaker: Secondary | ICD-10-CM | POA: Diagnosis not present

## 2018-01-09 DIAGNOSIS — A419 Sepsis, unspecified organism: Secondary | ICD-10-CM | POA: Diagnosis not present

## 2018-01-09 DIAGNOSIS — Z794 Long term (current) use of insulin: Secondary | ICD-10-CM

## 2018-01-09 DIAGNOSIS — K420 Umbilical hernia with obstruction, without gangrene: Secondary | ICD-10-CM | POA: Diagnosis present

## 2018-01-09 DIAGNOSIS — E039 Hypothyroidism, unspecified: Secondary | ICD-10-CM | POA: Diagnosis present

## 2018-01-09 DIAGNOSIS — Z825 Family history of asthma and other chronic lower respiratory diseases: Secondary | ICD-10-CM

## 2018-01-09 DIAGNOSIS — K7581 Nonalcoholic steatohepatitis (NASH): Secondary | ICD-10-CM | POA: Diagnosis present

## 2018-01-09 DIAGNOSIS — I4891 Unspecified atrial fibrillation: Secondary | ICD-10-CM | POA: Diagnosis present

## 2018-01-09 DIAGNOSIS — I110 Hypertensive heart disease with heart failure: Secondary | ICD-10-CM | POA: Diagnosis present

## 2018-01-09 DIAGNOSIS — N179 Acute kidney failure, unspecified: Secondary | ICD-10-CM | POA: Diagnosis present

## 2018-01-09 DIAGNOSIS — D649 Anemia, unspecified: Secondary | ICD-10-CM | POA: Diagnosis present

## 2018-01-09 DIAGNOSIS — J440 Chronic obstructive pulmonary disease with acute lower respiratory infection: Secondary | ICD-10-CM | POA: Diagnosis present

## 2018-01-09 DIAGNOSIS — J9621 Acute and chronic respiratory failure with hypoxia: Secondary | ICD-10-CM | POA: Diagnosis present

## 2018-01-09 DIAGNOSIS — I509 Heart failure, unspecified: Secondary | ICD-10-CM | POA: Diagnosis present

## 2018-01-09 DIAGNOSIS — E872 Acidosis: Secondary | ICD-10-CM | POA: Diagnosis present

## 2018-01-09 DIAGNOSIS — K922 Gastrointestinal hemorrhage, unspecified: Secondary | ICD-10-CM | POA: Diagnosis present

## 2018-01-09 DIAGNOSIS — E119 Type 2 diabetes mellitus without complications: Secondary | ICD-10-CM | POA: Diagnosis present

## 2018-01-09 DIAGNOSIS — Z8249 Family history of ischemic heart disease and other diseases of the circulatory system: Secondary | ICD-10-CM

## 2018-01-09 DIAGNOSIS — G4730 Sleep apnea, unspecified: Secondary | ICD-10-CM | POA: Diagnosis present

## 2018-01-09 DIAGNOSIS — E860 Dehydration: Secondary | ICD-10-CM | POA: Diagnosis present

## 2018-01-09 DIAGNOSIS — Z515 Encounter for palliative care: Secondary | ICD-10-CM | POA: Diagnosis present

## 2018-01-09 DIAGNOSIS — K7460 Unspecified cirrhosis of liver: Secondary | ICD-10-CM | POA: Diagnosis present

## 2018-01-09 DIAGNOSIS — G9341 Metabolic encephalopathy: Secondary | ICD-10-CM | POA: Diagnosis present

## 2018-01-09 DIAGNOSIS — Z6826 Body mass index (BMI) 26.0-26.9, adult: Secondary | ICD-10-CM

## 2018-01-09 DIAGNOSIS — R6520 Severe sepsis without septic shock: Secondary | ICD-10-CM | POA: Diagnosis present

## 2018-01-09 DIAGNOSIS — Z9114 Patient's other noncompliance with medication regimen: Secondary | ICD-10-CM

## 2018-01-09 DIAGNOSIS — K450 Other specified abdominal hernia with obstruction, without gangrene: Secondary | ICD-10-CM | POA: Diagnosis not present

## 2018-01-09 DIAGNOSIS — Z79899 Other long term (current) drug therapy: Secondary | ICD-10-CM

## 2018-01-09 DIAGNOSIS — R52 Pain, unspecified: Secondary | ICD-10-CM | POA: Diagnosis not present

## 2018-01-09 DIAGNOSIS — J9601 Acute respiratory failure with hypoxia: Secondary | ICD-10-CM | POA: Diagnosis not present

## 2018-01-09 DIAGNOSIS — M1990 Unspecified osteoarthritis, unspecified site: Secondary | ICD-10-CM | POA: Diagnosis present

## 2018-01-09 DIAGNOSIS — E875 Hyperkalemia: Secondary | ICD-10-CM | POA: Diagnosis present

## 2018-01-09 DIAGNOSIS — G9340 Encephalopathy, unspecified: Secondary | ICD-10-CM

## 2018-01-09 DIAGNOSIS — Z66 Do not resuscitate: Secondary | ICD-10-CM | POA: Diagnosis present

## 2018-01-09 DIAGNOSIS — I10 Essential (primary) hypertension: Secondary | ICD-10-CM | POA: Diagnosis present

## 2018-01-09 LAB — DIFFERENTIAL
BAND NEUTROPHILS: 0 %
BASOS ABS: 0 10*3/uL (ref 0–0.1)
Basophils Relative: 0 %
Blasts: 0 %
Eosinophils Absolute: 0 10*3/uL (ref 0–0.7)
Eosinophils Relative: 0 %
Lymphocytes Relative: 3 %
Lymphs Abs: 0.6 10*3/uL — ABNORMAL LOW (ref 1.0–3.6)
METAMYELOCYTES PCT: 0 %
MONO ABS: 2.3 10*3/uL — AB (ref 0.2–1.0)
MYELOCYTES: 0 %
Monocytes Relative: 12 %
Neutro Abs: 16.3 10*3/uL — ABNORMAL HIGH (ref 1.4–6.5)
Neutrophils Relative %: 85 %
Other: 0 %
Promyelocytes Relative: 0 %
Smear Review: ADEQUATE
nRBC: 0 /100 WBC

## 2018-01-09 LAB — COMPREHENSIVE METABOLIC PANEL
ALBUMIN: 2.8 g/dL — AB (ref 3.5–5.0)
ALT: 71 U/L — ABNORMAL HIGH (ref 17–63)
ANION GAP: 13 (ref 5–15)
AST: 105 U/L — ABNORMAL HIGH (ref 15–41)
Alkaline Phosphatase: 552 U/L — ABNORMAL HIGH (ref 38–126)
BILIRUBIN TOTAL: 2.9 mg/dL — AB (ref 0.3–1.2)
BUN: 46 mg/dL — ABNORMAL HIGH (ref 6–20)
CHLORIDE: 99 mmol/L — AB (ref 101–111)
CO2: 24 mmol/L (ref 22–32)
Calcium: 9.2 mg/dL (ref 8.9–10.3)
Creatinine, Ser: 1.73 mg/dL — ABNORMAL HIGH (ref 0.61–1.24)
GFR calc Af Amer: 49 mL/min — ABNORMAL LOW (ref >60)
GFR calc non Af Amer: 42 mL/min — ABNORMAL LOW (ref >60)
GLUCOSE: 177 mg/dL — AB (ref 65–99)
POTASSIUM: 4.9 mmol/L (ref 3.5–5.1)
SODIUM: 136 mmol/L (ref 135–145)
TOTAL PROTEIN: 5.9 g/dL — AB (ref 6.5–8.1)

## 2018-01-09 LAB — CBC
HEMATOCRIT: 41.4 % (ref 40.0–52.0)
HEMOGLOBIN: 13.9 g/dL (ref 13.0–18.0)
MCH: 33.4 pg (ref 26.0–34.0)
MCHC: 33.5 g/dL (ref 32.0–36.0)
MCV: 99.8 fL (ref 80.0–100.0)
Platelets: 432 10*3/uL (ref 150–440)
RBC: 4.15 MIL/uL — ABNORMAL LOW (ref 4.40–5.90)
RDW: 16.8 % — ABNORMAL HIGH (ref 11.5–14.5)
WBC: 19.2 10*3/uL — ABNORMAL HIGH (ref 3.8–10.6)

## 2018-01-09 LAB — PROTIME-INR
INR: 1.42
Prothrombin Time: 17.2 seconds — ABNORMAL HIGH (ref 11.4–15.2)

## 2018-01-09 LAB — TROPONIN I: Troponin I: 0.09 ng/mL (ref <0.03)

## 2018-01-09 LAB — GLUCOSE, CAPILLARY: GLUCOSE-CAPILLARY: 165 mg/dL — AB (ref 65–99)

## 2018-01-09 LAB — ABO/RH: ABO/RH(D): O POS

## 2018-01-09 LAB — APTT: aPTT: 37 seconds — ABNORMAL HIGH (ref 24–36)

## 2018-01-09 LAB — AMMONIA: Ammonia: 29 umol/L (ref 9–35)

## 2018-01-09 SURGERY — LAPAROTOMY, EXPLORATORY
Anesthesia: General

## 2018-01-09 MED ORDER — ONDANSETRON HCL 4 MG/2ML IJ SOLN: 4.0000 mg | Freq: Four times a day (QID) | INTRAMUSCULAR | Status: DC | PRN

## 2018-01-09 MED ORDER — SODIUM CHLORIDE 0.9 % IV SOLN: 10.0000 mL/h | Freq: Once | INTRAVENOUS | Status: DC

## 2018-01-09 MED ORDER — SODIUM CHLORIDE 0.9 % IV SOLN
8.0000 mg/h | INTRAVENOUS | Status: DC
  Administered 2018-01-09: 8 mg/h via INTRAVENOUS
  Filled 2018-01-09 (×2): qty 80

## 2018-01-09 MED ORDER — ETOMIDATE 2 MG/ML IV SOLN
INTRAVENOUS | Status: AC
  Filled 2018-01-09: qty 10

## 2018-01-09 MED ORDER — FENTANYL CITRATE (PF) 100 MCG/2ML IJ SOLN
INTRAMUSCULAR | Status: AC
  Filled 2018-01-09: qty 2

## 2018-01-09 MED ORDER — SODIUM CHLORIDE 0.9 % IV SOLN: 1.0000 g | Freq: Once | INTRAVENOUS | Status: DC

## 2018-01-09 MED ORDER — SODIUM CHLORIDE 0.9 % IV SOLN
Freq: Once | INTRAVENOUS | Status: AC
  Administered 2018-01-09: 20:00:00 via INTRAVENOUS

## 2018-01-09 MED ORDER — ONDANSETRON HCL 4 MG/2ML IJ SOLN
4.0000 mg | Freq: Once | INTRAMUSCULAR | Status: AC
  Administered 2018-01-09: 4 mg via INTRAVENOUS

## 2018-01-09 MED ORDER — VANCOMYCIN HCL IN DEXTROSE 1-5 GM/200ML-% IV SOLN
1000.0000 mg | Freq: Once | INTRAVENOUS | Status: AC
  Administered 2018-01-09: 1000 mg via INTRAVENOUS

## 2018-01-09 MED ORDER — IOPAMIDOL (ISOVUE-300) INJECTION 61%: 30.0000 mL | Freq: Once | INTRAVENOUS | Status: DC

## 2018-01-09 MED ORDER — SODIUM CHLORIDE 0.9 % IV SOLN
80.0000 mg | Freq: Once | INTRAVENOUS | Status: AC
  Administered 2018-01-09: 80 mg via INTRAVENOUS
  Filled 2018-01-09: qty 80

## 2018-01-09 MED ORDER — OCTREOTIDE LOAD VIA INFUSION
50.0000 ug | Freq: Once | INTRAVENOUS | Status: AC
  Administered 2018-01-09: 50 ug via INTRAVENOUS
  Filled 2018-01-09: qty 25

## 2018-01-09 MED ORDER — METOCLOPRAMIDE HCL 5 MG/ML IJ SOLN
10.0000 mg | Freq: Once | INTRAMUSCULAR | Status: AC
  Administered 2018-01-09: 10 mg via INTRAVENOUS
  Filled 2018-01-09: qty 2

## 2018-01-09 MED ORDER — AMIODARONE HCL IN DEXTROSE 360-4.14 MG/200ML-% IV SOLN
60.0000 mg/h | INTRAVENOUS | Status: DC
  Administered 2018-01-09: 60 mg/h via INTRAVENOUS
  Filled 2018-01-09 (×2): qty 200

## 2018-01-09 MED ORDER — IOHEXOL 300 MG/ML  SOLN
75.0000 mL | Freq: Once | INTRAMUSCULAR | Status: AC | PRN
  Administered 2018-01-09: 75 mL via INTRAVENOUS

## 2018-01-09 MED ORDER — PROPOFOL 10 MG/ML IV BOLUS
INTRAVENOUS | Status: AC
  Filled 2018-01-09: qty 20

## 2018-01-09 MED ORDER — PANTOPRAZOLE SODIUM 40 MG IV SOLR
INTRAVENOUS | Status: AC
  Filled 2018-01-09: qty 160

## 2018-01-09 MED ORDER — AMIODARONE HCL IN DEXTROSE 360-4.14 MG/200ML-% IV SOLN: 30.0000 mg/h | INTRAVENOUS | Status: DC

## 2018-01-09 MED ORDER — SODIUM CHLORIDE 0.9 % IV SOLN
50.0000 ug/h | INTRAVENOUS | Status: DC
  Administered 2018-01-09: 50 ug/h via INTRAVENOUS
  Filled 2018-01-09 (×3): qty 1

## 2018-01-09 MED ORDER — ONDANSETRON HCL 4 MG/2ML IJ SOLN
INTRAMUSCULAR | Status: AC
  Administered 2018-01-09: 4 mg via INTRAVENOUS
  Filled 2018-01-09: qty 2

## 2018-01-09 MED ORDER — SODIUM CHLORIDE 0.9 % IV SOLN
INTRAVENOUS | Status: DC
  Administered 2018-01-09: 150 mL/h via INTRAVENOUS
  Administered 2018-01-10: 06:00:00 via INTRAVENOUS

## 2018-01-09 MED ORDER — VANCOMYCIN HCL IN DEXTROSE 1-5 GM/200ML-% IV SOLN
1000.0000 mg | INTRAVENOUS | Status: DC
  Administered 2018-01-10: 1000 mg via INTRAVENOUS
  Filled 2018-01-09 (×2): qty 200

## 2018-01-09 MED ORDER — PANTOPRAZOLE SODIUM 40 MG IV SOLR
40.0000 mg | Freq: Two times a day (BID) | INTRAVENOUS | Status: DC
Start: 2018-01-13 — End: 2018-01-10

## 2018-01-09 MED ORDER — ONDANSETRON HCL 4 MG/2ML IJ SOLN
4.0000 mg | Freq: Once | INTRAMUSCULAR | Status: AC
  Administered 2018-01-09: 4 mg via INTRAVENOUS
  Filled 2018-01-09: qty 2

## 2018-01-09 MED ORDER — SODIUM CHLORIDE 0.9 % IV BOLUS
1000.0000 mL | Freq: Once | INTRAVENOUS | Status: AC
  Administered 2018-01-09: 1000 mL via INTRAVENOUS

## 2018-01-09 MED ORDER — INSULIN ASPART 100 UNIT/ML ~~LOC~~ SOLN
0.0000 [IU] | Freq: Four times a day (QID) | SUBCUTANEOUS | Status: DC
Start: 2018-01-10 — End: 2018-01-10
  Administered 2018-01-10: 3 [IU] via SUBCUTANEOUS
  Filled 2018-01-09: qty 1

## 2018-01-09 MED ORDER — SODIUM CHLORIDE 0.9 % IV SOLN
1.0000 g | Freq: Two times a day (BID) | INTRAVENOUS | Status: DC
  Administered 2018-01-09 – 2018-01-10 (×2): 1 g via INTRAVENOUS
  Filled 2018-01-09 (×4): qty 1

## 2018-01-09 MED ORDER — ONDANSETRON HCL 4 MG PO TABS: 4.0000 mg | ORAL_TABLET | Freq: Four times a day (QID) | ORAL | Status: DC | PRN

## 2018-01-09 MED ORDER — ONDANSETRON HCL 4 MG/2ML IJ SOLN
INTRAMUSCULAR | Status: AC
  Filled 2018-01-09: qty 2

## 2018-01-09 MED ORDER — AMIODARONE LOAD VIA INFUSION
150.0000 mg | Freq: Once | INTRAVENOUS | Status: AC
  Administered 2018-01-09: 150 mg via INTRAVENOUS
  Filled 2018-01-09: qty 83.34

## 2018-01-09 MED ORDER — AMIODARONE HCL IN DEXTROSE 360-4.14 MG/200ML-% IV SOLN
INTRAVENOUS | Status: AC
  Administered 2018-01-09: 60 mg/h via INTRAVENOUS
  Filled 2018-01-09: qty 200

## 2018-01-09 SURGICAL SUPPLY — 49 items
APPLIER CLIP 11 MED OPEN (CLIP) ×3
APPLIER CLIP 13 LRG OPEN (CLIP) ×3
APR CLP LRG 13 20 CLIP (CLIP) ×1
APR CLP MED 11 20 MLT OPN (CLIP) ×1
BLADE CLIPPER SURG (BLADE) ×4 IMPLANT
BLADE SURG 15 STRL LF DISP TIS (BLADE) ×2 IMPLANT
BLADE SURG 15 STRL SS (BLADE) ×3
CANISTER SUCT 3000ML PPV (MISCELLANEOUS) ×4 IMPLANT
CHLORAPREP W/TINT 26ML (MISCELLANEOUS) ×4 IMPLANT
CLIP APPLIE 11 MED OPEN (CLIP) ×2 IMPLANT
CLIP APPLIE 13 LRG OPEN (CLIP) ×2 IMPLANT
DRAPE LAPAROTOMY 100X77 ABD (DRAPES) ×4 IMPLANT
DRAPE TABLE BACK 80X90 (DRAPES) ×4 IMPLANT
DRSG TEGADERM 2-3/8X2-3/4 SM (GAUZE/BANDAGES/DRESSINGS) ×8 IMPLANT
DRSG TELFA 3X8 NADH (GAUZE/BANDAGES/DRESSINGS) ×3 IMPLANT
ELECT BLADE 6.5 EXT (BLADE) ×4 IMPLANT
ELECT REM PT RETURN 9FT ADLT (ELECTROSURGICAL) ×3
ELECTRODE REM PT RTRN 9FT ADLT (ELECTROSURGICAL) ×2 IMPLANT
GAUZE SPONGE 4X4 12PLY STRL (GAUZE/BANDAGES/DRESSINGS) ×8 IMPLANT
GLOVE BIO SURGEON STRL SZ7 (GLOVE) ×4 IMPLANT
GOWN STRL REUS W/ TWL LRG LVL3 (GOWN DISPOSABLE) ×4 IMPLANT
GOWN STRL REUS W/TWL LRG LVL3 (GOWN DISPOSABLE) ×6
HANDLE SUCTION POOLE (INSTRUMENTS) ×2 IMPLANT
HANDLE YANKAUER SUCT BULB TIP (MISCELLANEOUS) ×4 IMPLANT
LIGASURE IMPACT 36 18CM CVD LR (INSTRUMENTS) IMPLANT
NDL HYPO 25X1 1.5 SAFETY (NEEDLE) ×1 IMPLANT
NEEDLE HYPO 22GX1.5 SAFETY (NEEDLE) ×8 IMPLANT
NEEDLE HYPO 25X1 1.5 SAFETY (NEEDLE) ×3 IMPLANT
PACK BASIN MAJOR ARMC (MISCELLANEOUS) ×4 IMPLANT
PAD DRESSING TELFA 3X8 NADH (GAUZE/BANDAGES/DRESSINGS) ×1 IMPLANT
RELOAD PROXIMATE 75MM BLUE (ENDOMECHANICALS) IMPLANT
RELOAD STAPLE 75 3.8 BLU REG (ENDOMECHANICALS) IMPLANT
SPONGE LAP 18X18 5 PK (GAUZE/BANDAGES/DRESSINGS) ×4 IMPLANT
SPONGE LAP 18X36 2PK (MISCELLANEOUS) IMPLANT
STAPLER PROXIMATE 75MM BLUE (STAPLE) IMPLANT
STAPLER SKIN PROX 35W (STAPLE) ×4 IMPLANT
SUCTION POOLE HANDLE (INSTRUMENTS) ×3
SUT PDS AB 1 TP1 96 (SUTURE) ×8 IMPLANT
SUT SILK 2 0 (SUTURE) ×3
SUT SILK 2 0 SH CR/8 (SUTURE) ×4 IMPLANT
SUT SILK 2 0SH CR/8 30 (SUTURE) ×4 IMPLANT
SUT SILK 2-0 18XBRD TIE 12 (SUTURE) ×2 IMPLANT
SUT VIC AB 0 CT1 36 (SUTURE) ×8 IMPLANT
SUT VIC AB 2-0 SH 27 (SUTURE) ×6
SUT VIC AB 2-0 SH 27XBRD (SUTURE) ×4 IMPLANT
SYR 30ML LL (SYRINGE) ×8 IMPLANT
SYR 3ML LL SCALE MARK (SYRINGE) ×4 IMPLANT
TAPE MICROFOAM 4IN (TAPE) ×4 IMPLANT
TRAY FOLEY W/METER SILVER 16FR (SET/KITS/TRAYS/PACK) ×4 IMPLANT

## 2018-01-09 NOTE — Progress Notes (Signed)
ANTIBIOTIC CONSULT NOTE - INITIAL  Pharmacy Consult for Vancomycin , meropenem  Indication: sepsis  Allergies  Allergen Reactions  . Penicillins Swelling and Other (See Comments)    SWELLING OF AIRWAY  PATIENT HAD A PCN REACTION WITH IMMEDIATE RASH, FACIAL/TONGUE/THROAT SWELLING, SOB, OR LIGHTHEADEDNESS WITH HYPOTENSION:  #  #  #  YES  #  #  #  Has patient had a PCN reaction causing severe rash involving mucus membranes or skin necrosis: No Has patient had a PCN reaction that required hospitalization No Has patient had a PCN reaction occurring within the last 10 years: No  . Enalapril Cough  . Metformin And Related Diarrhea  . Ciprofloxacin     Itching and red rash up arm when infusing after 3rd cipro dose for uti on 10/27/13  . Eliquis [Apixaban] Other (See Comments)    DOSE RELATED BLEEDING    Patient Measurements: Height: 5\' 3"  (160 cm) Weight: 179 lb (81.2 kg) IBW/kg (Calculated) : 56.9 Adjusted Body Weight: 66.62 kg   Vital Signs: Temp: 98.1 F (36.7 C) (04/22 2014) BP: 111/71 (04/22 2014) Pulse Rate: 51 (04/22 2000) Intake/Output from previous day: No intake/output data recorded. Intake/Output from this shift: Total I/O In: 400 [Blood:400] Out: -   Labs: Recent Labs    12/19/2017 1829  WBC 19.2*  HGB 13.9  PLT 432  CREATININE 1.73*   Estimated Creatinine Clearance: 44.4 mL/min (A) (by C-G formula based on SCr of 1.73 mg/dL (H)). No results for input(s): VANCOTROUGH, VANCOPEAK, VANCORANDOM, GENTTROUGH, GENTPEAK, GENTRANDOM, TOBRATROUGH, TOBRAPEAK, TOBRARND, AMIKACINPEAK, AMIKACINTROU, AMIKACIN in the last 72 hours.   Microbiology: No results found for this or any previous visit (from the past 720 hour(s)).  Medical History: Past Medical History:  Diagnosis Date  . Alcohol use   . Anemia   . Aortic stenosis    mild  . Arthritis   . Atrial flutter ([place])   . Benign hypertrophy of prostate    with urinary retention; repaired hypospadia; transurethral  resection of the prostate   . Cardiac conduction disorder    status post pacemaker implantation in 1995 generator replaced 2005  . Chest pain 2007, 2009   cardiac cath > normal coronary arterie; nl left ventricular function  . CHF (congestive heart failure) ([place])   . Cirrhosis ([place]) 11/2016  . Congenital deafness   . COPD (chronic obstructive pulmonary disease) ([place])   . Diabetes mellitus    +Insulin  . Dyspnea   . Edema   . GERD (gastroesophageal reflux disease)    status post multiple dilatations for stricture  . 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 Hepatitis    Thinks it was B  . History of kidney stones   . HOH (hard of hearing)   . HTN (hypertension)   . Hyperlipidemia    statin discontinued due to abn LFTs  . Hypothyroidism   . Mitral regurgitation    insignificant  . Obesity   . Obesity 4/08   BMI= 40  . Pelvic mass    identified in 2009, stable 2010  . PONV (postoperative nausea and vomiting)   . Presence of permanent cardiac pacemaker   . Sleep apnea    BiPAP and continuous oxygen  . Syncope   . Thyroid disease     Medications:   (Not in a hospital admission) Assessment: CrCl = 44.4 ml/min  Ke = 0.04 hr-1 T1/2 = 16.9 hrs VD = 46.6  L   Goal of Therapy:  Vancomycin trough level 15-20 mcg/ml  Plan:  Expected duration 7 days with resolution of temperature and/or normalization of WBC   Vancomycin 1 gm IV X 1 given on 4/22 @ 22:00. Vancomycin 1 gm IV Q24H ordered to start on 4/23 @ 0400, ~ 6 hrs after 1st dose (stacked dosing). This pt will reach Css by 4/26 @ 10:00. Will draw 1st trough on 4/26 @ 0330, which will be very close to Css.  Meropenem 1 gm IV Q12H ordered to start on 4/22 @ 22:00.   Gianluca Chhim D 12/23/2017,9:41 PM

## 2018-01-09 NOTE — ED Notes (Signed)
Patient transported to CT 

## 2018-01-09 NOTE — Progress Notes (Signed)
Letter completed for patient assistance.

## 2018-01-09 NOTE — H&P (Addendum)
[name] at [place] NAME: [name]    MR#:  [id number]  DATE OF BIRTH:  [birth year]  DATE OF ADMISSION:  12/22/2017  PRIMARY CARE PHYSICIAN: Petra Kuba, MD   REQUESTING/REFERRING PHYSICIAN: Alfred Levins, MD  CHIEF COMPLAINT:   Chief Complaint  Patient presents with  . Altered Mental Status  . Emesis    HISTORY OF PRESENT ILLNESS:  [name]  is a 57 y.o. male who presents with change in mental status and blood in his ostomy.  Patient's wife states that for the past couple days he has been acting strange and speaking in a confused way.  Patient has an extensive history of comorbid medical conditions including cirrhosis status post TIPS, diabetes, hypertension, hyperlipidemia, A. fib, among others.  Patient had an EGD done a couple weeks ago which showed no varices, but he did have diffuse gastropathy.  Upon arriving in the ED here tonight he was found to be in A. fib with RVR, has a profound leukocytosis, and while here began vomiting black emesis.  His blood pressure has been stable and his hemoglobin on lab check is 13.9, though I strongly suspect he has hemoconcentration and that his hemoglobin level is actually lower.  Given the amount of emesis the patient is having, 2 units of blood have been ordered and started in the ED, and hospitalist were called for admission  PAST MEDICAL HISTORY:   Past Medical History:  Diagnosis Date  . Alcohol use   . Anemia   . Aortic stenosis    mild  . Arthritis   . Atrial flutter ([place])   . Benign hypertrophy of prostate    with urinary retention; repaired hypospadia; transurethral resection of the prostate   . Cardiac conduction disorder    status post pacemaker implantation in 1995 generator replaced 2005  . Chest pain 2007, 2009   cardiac cath > normal coronary arterie; nl left ventricular function  . CHF (congestive heart failure) ([place])   . Cirrhosis ([place]) 11/2016  .  Congenital deafness   . COPD (chronic obstructive pulmonary disease) ([place])   . Diabetes mellitus    +Insulin  . Dyspnea   . Edema   . GERD (gastroesophageal reflux disease)    status post multiple dilatations for stricture  . 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 Hepatitis    Thinks it was B  . History of kidney stones   . HOH (hard of hearing)   . HTN (hypertension)   . Hyperlipidemia    statin discontinued due to abn LFTs  . Hypothyroidism   . Mitral regurgitation    insignificant  . Obesity   . Obesity 4/08   BMI= 40  . Pelvic mass    identified in 2009, stable 2010  . PONV (postoperative nausea and vomiting)   . Presence of permanent cardiac pacemaker   . Sleep apnea    BiPAP and continuous oxygen  . Syncope   . Thyroid disease      PAST SURGICAL HISTORY:   Past Surgical History:  Procedure Laterality Date  . A-V CARDIAC PACEMAKER INSERTION  1995  . COLONOSCOPY  2008  . COLONOSCOPY N/A 06/23/2015   LOV:FIEPPIR diverticulosis  . ESOPHAGEAL BANDING N/A 12/22/2017   Procedure: ESOPHAGEAL BANDING;  Surgeon: Daneil Dolin, MD;  [facility];  Service: Endoscopy;  Laterality: N/A;  . ESOPHAGOGASTRODUODENOSCOPY N/A 06/23/2015  XBW:IOMB portal gastropathy  . ESOPHAGOGASTRODUODENOSCOPY (EGD) WITH PROPOFOL N/A 12/22/2017   Procedure: ESOPHAGOGASTRODUODENOSCOPY (EGD) WITH PROPOFOL;  Surgeon: Daneil Dolin, MD;  [facility];  Service: Endoscopy;  Laterality: N/A;  7:30am  . IR GENERIC HISTORICAL  12/14/2016   IR PARACENTESIS 12/14/2016 Sandi Mariscal, MD [facility]  . IR GENERIC HISTORICAL  12/14/2016   IR TIPS 12/14/2016 Sandi Mariscal, MD [facility]  . IR PARACENTESIS  01/12/2017  . IR RADIOLOGIST EVAL & MGMT  01/06/2017  . IR RADIOLOGIST EVAL & MGMT  02/08/2017  . IR RADIOLOGIST EVAL & MGMT  11/30/2016  . IR RADIOLOGIST EVAL & MGMT  09/22/2017  . IR TIPS  01/12/2017  . NASAL SINUS SURGERY      partially removed  . ORCHIECTOMY  1990   bilateral; ?neoplasm  . PACEMAKER INSERTION  05/2004; 10/04/2013   MDT dual chamber pacemaker; gen change 10/04/2013 (MDT ADDRL1)  . PERMANENT PACEMAKER GENERATOR CHANGE N/A 10/04/2013   Procedure: PERMANENT PACEMAKER GENERATOR CHANGE;  Surgeon: Evans Lance, MD;  Location: [facility] CATH LAB;  Service: Cardiovascular;  Laterality: N/A;  . RADIOLOGY WITH ANESTHESIA N/A 12/14/2016   Procedure: TIPS;  Surgeon: Sandi Mariscal, MD;  Location: [facility];  Service: Radiology;  Laterality: N/A;  . RADIOLOGY WITH ANESTHESIA N/A 01/12/2017   Procedure: TIPS;  Surgeon: Sandi Mariscal, MD;  Location: [facility];  Service: Radiology;  Laterality: N/A;  . REPAIR HYPOSPADIAS W/ URETHROPLASTY    . TONSILLECTOMY AND ADENOIDECTOMY    . TRANSURETHRAL RESECTION OF PROSTATE     for BPH     SOCIAL HISTORY:   Social History   Tobacco Use  . Smoking status: Never Smoker  . Smokeless tobacco: Never Used  . Tobacco comment: tobacco use- no   Substance Use Topics  . Alcohol use: No    Alcohol/week: 0.0 oz    Comment: Hx. of alcohol use     FAMILY HISTORY:   Family History  Problem Relation Age of Onset  . COPD Mother   . Hypertension Mother   . Congestive Heart Failure Mother   . Pneumonia Father   . Heart disease Other   . Hypertension Other   . Colon cancer Neg Hx      DRUG ALLERGIES:   Allergies  Allergen Reactions  . Penicillins Swelling and Other (See Comments)    SWELLING OF AIRWAY  PATIENT HAD A PCN REACTION WITH IMMEDIATE RASH, FACIAL/TONGUE/THROAT SWELLING, SOB, OR LIGHTHEADEDNESS WITH HYPOTENSION:  #  #  #  YES  #  #  #  Has patient had a PCN reaction causing severe rash involving mucus membranes or skin necrosis: No Has patient had a PCN reaction that required hospitalization No Has patient had a PCN reaction occurring within the last 10 years: No  . Enalapril Cough  . Metformin And Related Diarrhea  . Ciprofloxacin     Itching and red rash up arm when  infusing after 3rd cipro dose for uti on 10/27/13  . Eliquis [Apixaban] Other (See Comments)    DOSE RELATED BLEEDING    MEDICATIONS AT HOME:   Prior to Admission medications   Medication Sig Start Date End Date Taking? Authorizing Provider  acetaminophen (TYLENOL) 325 MG tablet Take 325 mg by mouth every 6 (six) hours as needed for mild pain or headache.     [provider]  albuterol (PROVENTIL HFA;VENTOLIN HFA) 108 (90 Base) MCG/ACT inhaler Inhale 2 puffs into the lungs every 6 (six) hours as needed for  wheezing or shortness of breath.    [provider]  albuterol (PROVENTIL) (2.5 MG/3ML) 0.083% nebulizer solution Take 2.5 mg by nebulization every 6 (six) hours as needed for wheezing or shortness of breath.    [provider]  Calcium Carb-Cholecalciferol (CALCIUM 600+D) 600-800 MG-UNIT TABS Take 1 tablet by mouth 2 (two) times daily.     [provider]  cetirizine (ZYRTEC) 10 MG tablet Take 10 mg by mouth daily as needed for allergies.     [provider]  feeding supplement, ENSURE ENLIVE, (ENSURE ENLIVE) LIQD Take 237 mLs by mouth 3 (three) times daily between meals. Patient taking differently: Take 237 mLs by mouth 3 (three) times daily as needed (for poor nutrition.).  04/19/17   Dhungel, Flonnie Overman, MD  furosemide (LASIX) 20 MG tablet Take 1 tablet (20 mg total) daily by mouth. 08/04/17   Branch, Alphonse Guild, MD  gentamicin ointment (GARAMYCIN) 0.1 % Apply 1 application topically 3 (three) times daily as needed (for nose bleeds).     [provider]  insulin aspart protamine- aspart (NOVOLOG MIX 70/30) (70-30) 100 UNIT/ML injection Inject 2-4 Units into the skin 2 (two) times daily. 4 units with breakfast & 2 units with supper    [provider]  lactulose (CHRONULAC) 10 GM/15ML solution 10cc (2 teaspoons) three times daily; Titrate to 2-3 semi formed stools a day Patient taking differently: Take 10 g by mouth See admin  instructions. 10cc (2 teaspoons) three times daily; Titrate to 2-3 semi formed stools a day 05/25/17   Annitta Needs, NP  levothyroxine (LEVOXYL) 50 MCG tablet Take 50 mcg by mouth daily before breakfast.     [provider]  metoprolol succinate (TOPROL-XL) 25 MG 24 hr tablet Take 1 tablet (25 mg total) daily by mouth. 08/04/17   Arnoldo Lenis, MD  modafinil (PROVIGIL) 200 MG tablet Take 200 mg by mouth daily.  01/13/15   [provider]  mometasone (NASONEX) 50 MCG/ACT nasal spray Place 2 sprays into the nose daily as needed (for congestion/allergies).     [provider]  Multiple Vitamins-Minerals (CENTRUM) tablet Take 1 tablet by mouth daily.     [provider]  nitroGLYCERIN (NITROSTAT) 0.4 MG SL tablet Place 1 tablet (0.4 mg total) under the tongue every 5 (five) minutes as needed for chest pain. Patient taking differently: Place 0.4 mg under the tongue every 5 (five) minutes x 3 doses as needed for chest pain.  06/26/15   Lendon Colonel, NP  omeprazole (PRILOSEC) 40 MG capsule TAKE 1 CAPSULE BY MOUTH ONCE DAILY FOR REFLUX. 12/21/17   Arnoldo Lenis, MD  spironolactone (ALDACTONE) 50 MG tablet Take 1 tablet (50 mg total) daily by mouth. 08/04/17   Arnoldo Lenis, MD  ursodiol (ACTIGALL) 500 MG tablet TAKE 1 TABLET BY MOUTH TWICE DAILY 12/09/17   Carlis Stable, NP  XIFAXAN 550 MG TABS tablet TAKE 1 TABLET BY MOUTH TWICE DAILY Patient taking differently: TAKE 0.5 TABLET BY MOUTH TWICE DAILY 08/31/17   Annitta Needs, NP    REVIEW OF SYSTEMS:  Review of Systems  Constitutional: Positive for malaise/fatigue. Negative for chills, fever and weight loss.  HENT: Negative for ear pain, hearing loss and tinnitus.   Eyes: Negative for blurred vision, double vision, pain and redness.  Respiratory: Negative for cough, hemoptysis and shortness of breath.   Cardiovascular: Negative for chest pain, palpitations, orthopnea and leg swelling.  Gastrointestinal:  Positive for abdominal pain, blood  in stool, nausea and vomiting. Negative for constipation and diarrhea.  Genitourinary: Negative for dysuria, frequency and hematuria.  Musculoskeletal: Negative for back pain, joint pain and neck pain.  Skin:       No acne, rash, or lesions  Neurological: Positive for weakness. Negative for dizziness, tremors and focal weakness.  Endo/Heme/Allergies: Negative for polydipsia. Does not bruise/bleed easily.  Psychiatric/Behavioral: Negative for depression. The patient is not nervous/anxious and does not have insomnia.      VITAL SIGNS:   Vitals:   01/01/2018 1945 12/28/2017 1947 01/01/2018 2000 12/19/2017 2014  BP: 90/66 90/66 111/71 111/71  Pulse: 63 (!) 125 (!) 51   Resp: (!) 35 (!) 21 (!) 29 (!) 21  Temp:  98.1 F (36.7 C)  98.1 F (36.7 C)  SpO2: 97%  94%   Weight:      Height:       Wt Readings from Last 3 Encounters:  12/21/2017 81.2 kg (179 lb)  12/22/17 81.2 kg (179 lb)  12/21/17 81.6 kg (179 lb 12.8 oz)    PHYSICAL EXAMINATION:  Physical Exam  Vitals reviewed. Constitutional: He appears well-developed and well-nourished. No distress.  HENT:  Head: Normocephalic and atraumatic.  Mouth/Throat: Oropharynx is clear and moist.  Eyes: Pupils are equal, round, and reactive to light. Conjunctivae and EOM are normal. No scleral icterus.  Neck: Normal range of motion. Neck supple. No JVD present. No thyromegaly present.  Cardiovascular: Intact distal pulses. Exam reveals no gallop and no friction rub.  No murmur heard. Tachycardic, irregular  Respiratory: Effort normal and breath sounds normal. No respiratory distress. He has no wheezes. He has no rales.  GI: Soft. Bowel sounds are normal. He exhibits no distension. There is tenderness.  Actively vomiting black emesis  Musculoskeletal: Normal range of motion. He exhibits no edema.  No arthritis, no gout  Lymphadenopathy:    He has no cervical adenopathy.  Neurological: He is alert. No cranial  nerve deficit.  Unable to fully assess due to patient condition, patient is not oriented, though he can answer simple questions about symptoms such as pain  Skin: Skin is warm and dry. No rash noted. No erythema.  Psychiatric:  Unable to assess due to patient condition    LABORATORY PANEL:   CBC Recent Labs  Lab 12/21/2017 1829  WBC 19.2*  HGB 13.9  HCT 41.4  PLT 432   ------------------------------------------------------------------------------------------------------------------  Chemistries  Recent Labs  Lab 12/30/2017 1829  NA 136  K 4.9  CL 99*  CO2 24  GLUCOSE 177*  BUN 46*  CREATININE 1.73*  CALCIUM 9.2  AST 105*  ALT 71*  ALKPHOS 552*  BILITOT 2.9*   ------------------------------------------------------------------------------------------------------------------  Cardiac Enzymes No results for input(s): TROPONINI in the last 168 hours. ------------------------------------------------------------------------------------------------------------------  RADIOLOGY:  Dg Chest Portable 1 View  Result Date: 01/13/2018 CLINICAL DATA:  Hypotension. EXAM: PORTABLE CHEST 1 VIEW COMPARISON:  04/15/2017 FINDINGS: Stable cardiac enlargement and appearance of dual-chamber pacemaker. Pacing pads present. Lung volumes are low bilaterally. There is no evidence of pulmonary edema, consolidation, pneumothorax, nodule or pleural fluid. There is significant gaseous distention of the stomach. IMPRESSION: No acute findings with stable cardiomegaly and radiographic appearance of pacemaker. Significant gaseous distention of the stomach noted. Electronically Signed   By: Aletta Edouard M.D.   On: 12/27/2017 19:51    EKG:   Orders placed or performed during the hospital encounter of 12/30/2017  . EKG 12-Lead  . EKG 12-Lead    IMPRESSION AND PLAN:  Principal Problem:   GI bleed -patient is having significant coffee-ground and black emesis.  Suspect upper GI bleed, though reportedly  he did also have a fair amount of bright red blood in his ostomy bag between yesterday and today.  Unclear if he potentially has 2 bleeding sources.  Alternatively he could potentially have some obstruction going on, though I doubt he would have bled enough to backbled all the way up to the point that he be vomiting from a lower GI source given the fact that his hemoglobin is not significantly low.  I do suspect some amount of hemoconcentration with his hemoglobin and we will trend this serially.  He is already getting a couple units of blood here in the ED, will monitor closely and transfuse more as needed.  His INR was within normal limits.  We will admit him to the ICU with close monitoring and support and a GI consult.  He is currently on a PPI drip as well and has an octreotide drip. I have ordered an NG tube into his stomach given the fact that he is known to have no varices, just to suction out what ever bloody emesis he may have remaining, and I have ordered a CT abdomen and pelvis to further investigate any potential obstruction or abdominal infection. Active Problems:   AKI (acute kidney injury) ([place]) -due to his blood loss, dehydration.  Aggressive IV fluids, avoid nephrotoxins and monitor for expected improvement   Atrial fibrillation with RVR ([facility]) -blood pressure is stable for now but is low end normotensive.  This limits [name] in terms of what we can do to slow his heart rate.  His troponin is mildly elevated which I suspect is due to demand ischemia.  Patient's wife states he has not been taking his medicines for the past couple of days and I suspect he may have been in RVR for some time now.  We will trend his troponins serially and once his blood pressure improves will be able to give him a nodal blocking agent to slow his heart rate.   Type 2 diabetes mellitus (H[facility]) -sliding scale insulin with corresponding glucose checks   Hypertension -avoid antihypertensives for now as the patient's blood  pressure is low end normotensive   Liver cirrhosis secondary to NASH ([place]) -continue home dose lactulose and Xifaxan once he is able to take p.o.  His ammonia was not elevated   COPD (chronic obstructive pulmonary disease) (H[facility]) -home dose inhalers   GASTROESOPHAGEAL REFLUX DISEASE -patient is on PPI drip   Hypothyroidism -home dose thyroid replacement  Chart review performed and case discussed with ED provider. Labs, imaging and/or ECG reviewed by provider and discussed with patient/family. Management plans discussed with the patient and/or family.  DVT PROPHYLAXIS: Mechanical only  GI PROPHYLAXIS: PPI  ADMISSION STATUS: Inpatient  CODE STATUS: Full Code Status History    Date Active Date Inactive Code Status Order ID Comments User Context   04/16/2017 0007 04/21/2017 1426 Full Code [id number]  Truett Mainland, DO Inpatient   12/23/2016 2006 12/26/2016 1706 Full Code [id number]  Orvan Falconer, MD Inpatient   11/11/2016 0259 11/14/2016 2044 Full Code [id number]  Phillips Grout, MD Inpatient   07/15/2016 1905 07/16/2016 1758 Full Code [id number]  Reubin Milan, MD Inpatient   02/29/2016 2045 03/03/2016 1633 Full Code [id number]  Jani Gravel, MD Inpatient   10/25/2013 2114 10/28/2013 1710 Full Code [id number]  Modena Jansky, MD Inpatient   10/04/2013 1550 10/04/2013 2009 Full  Code [id number]  Evans Lance, MD Inpatient      TOTAL CRITICAL CARE TIME TAKING CARE OF THIS PATIENT: 45 minutes.   Eneida Evers [place] 12/25/2017, 8:50 PM  [facility]  Office  [phone]  CC: Primary care physician; Petra Kuba, MD  Note:  This document was prepared using Dragon voice recognition software and may include unintentional dictation errors.

## 2018-01-09 NOTE — ED Notes (Signed)
Emergency blood started at this time

## 2018-01-09 NOTE — ED Notes (Signed)
#  29 F NG tube placed and pt vomiting around it and tube clogged. Dr Jannifer Franklin aware. Tube out. He will talk to elink MD

## 2018-01-09 NOTE — Progress Notes (Signed)
   12/22/2017 1915  Clinical Encounter Type  Visited With Patient and family together  Visit Type Initial  Referral From Nurse  Consult/Referral To [facility] responded to page.  Patient spouse and her brother were sitting with patient.  Spouse Pam spoke about decision to bring patient to this hospital and his recent health issues.  She engaged in review of deaths and illness of family members in last few years.  Pam requested prayer for patient.  Chaplain led prayer for healing, strength, and God's presence to support patient, family, and care team.  Spouse reported that she would likely go home to rest after patient is admitted.  Family open to ongoing chaplain follow up and chaplain encouraged patient/family to page for chaplain as needed.

## 2018-01-09 NOTE — ED Notes (Signed)
1st unit RBCs completed

## 2018-01-09 NOTE — Progress Notes (Signed)
MEDICATION RELATED CONSULT NOTE - INITIAL   Pharmacy Consult for drug-drug interactions w/ amiodarone Indication: afib w/ RVR  Allergies  Allergen Reactions  . Penicillins Swelling and Other (See Comments)    SWELLING OF AIRWAY  PATIENT HAD A PCN REACTION WITH IMMEDIATE RASH, FACIAL/TONGUE/THROAT SWELLING, SOB, OR LIGHTHEADEDNESS WITH HYPOTENSION:  #  #  #  YES  #  #  #  Has patient had a PCN reaction causing severe rash involving mucus membranes or skin necrosis: No Has patient had a PCN reaction that required hospitalization No Has patient had a PCN reaction occurring within the last 10 years: No  . Enalapril Cough  . Metformin And Related Diarrhea  . Ciprofloxacin     Itching and red rash up arm when infusing after 3rd cipro dose for uti on 10/27/13  . Eliquis [Apixaban] Other (See Comments)    DOSE RELATED BLEEDING    Patient Measurements: Height: 5\' 6"  (167.6 cm) Weight: 165 lb 12.6 oz (75.2 kg) IBW/kg (Calculated) : 63.8 Adjusted Body Weight: 75.2 kg  Vital Signs: Temp: 97.5 F (36.4 C) (04/22 2222) Temp Source: Oral (04/22 2222) BP: 89/51 (04/22 2315) Pulse Rate: 99 (04/22 2315) Intake/Output from previous day: No intake/output data recorded. Intake/Output from this shift: Total I/O In: 3530 [I.V.:2730; Blood:800] Out: -   Labs: Recent Labs    01/17/2018 1829 01/12/2018 1830  WBC 19.2*  --   HGB 13.9  --   HCT 41.4  --   PLT 432  --   APTT  --  37*  CREATININE 1.73*  --   ALBUMIN 2.8*  --   PROT 5.9*  --   AST 105*  --   ALT 71*  --   ALKPHOS 552*  --   BILITOT 2.9*  --    Estimated Creatinine Clearance: 42.5 mL/min (A) (by C-G formula based on SCr of 1.73 mg/dL (H)).   Microbiology: No results found for this or any previous visit (from the past 720 hour(s)).  Medical History: Past Medical History:  Diagnosis Date  . Alcohol use   . Anemia   . Aortic stenosis    mild  . Arthritis   . Atrial flutter ([place])   . Benign hypertrophy of prostate    with urinary retention; repaired hypospadia; transurethral resection of the prostate   . Cardiac conduction disorder    status post pacemaker implantation in 1995 generator replaced 2005  . Chest pain 2007, 2009   cardiac cath > normal coronary arterie; nl left ventricular function  . CHF (congestive heart failure) ([place])   . Cirrhosis ([place]) 11/2016  . Congenital deafness   . COPD (chronic obstructive pulmonary disease) ([place])   . Diabetes mellitus    +Insulin  . Dyspnea   . Edema   . GERD (gastroesophageal reflux disease)    status post multiple dilatations for stricture  . 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 Hepatitis    Thinks it was B  . History of kidney stones   . HOH (hard of hearing)   . HTN (hypertension)   . Hyperlipidemia    statin discontinued due to abn LFTs  . Hypothyroidism   . Mitral regurgitation    insignificant  . Obesity   . Obesity 4/08   BMI= 40  . Pelvic mass    identified in 2009, stable 2010  . PONV (postoperative nausea and vomiting)   . Presence of permanent  cardiac pacemaker   . Sleep apnea    BiPAP and continuous oxygen  . Syncope   . Thyroid disease     Medications:  Scheduled:  . amiodarone  150 mg Intravenous Once  . [START ON January 15, 2018] insulin aspart  0-9 Units Subcutaneous Q6H  . [START ON 01/13/2018] pantoprazole  40 mg Intravenous Q12H    Assessment: Patient admitted for vomiting dark blood w/ h/o cirrhosis s/p TIPS and afib w/ RVR. No varices but has gastropathy. Patient is being placed on amiodarone drip for afib w/ RVR and is going for emergent laproscopy 04/22 EKG QTc 418 in afib w/ RVR  Amiodarone + ondansetron = increased risk of QTc prolongation Amiodarone + octreotide = increased risk of QTc prolongation  Goal of Therapy:  Avoidance of drug-drug interactions and normalization of QTc (QTc < 470 msec)  Plan:  No intervention needed at this time given  normal baseline QTc and clinical scenario. Patient w/ afib w/ RVR needing amiodarone to maintain rate, will continue amiodarone. Octreotide w/ h/o alcoholic cirrhosis and possible GI bleed will continue octreotide.  Recommendation: continue monitoring EKG's to ensure normal QTc interval and monitoring of atrial rate for afib.  Tobie Lords, PharmD, BCPS Clinical Pharmacist 01/15/2018

## 2018-01-09 NOTE — Consult Note (Signed)
Patient ID: [name], male   DOB: [birth year], 57 y.o.   MRN: [id number]  HPI [name] is a 57 y.o. male ASKED to see in consultation by Mrs. Tukov NP.  To the emergency room complaining of abdominal pain nausea and vomiting.  So had some altered mental status.  I took the history from the wife since the patient is encephalopathic.  She reports that the pain started yesterday.  Pain progressed very rapidly.  Patient has a complex medical history including CHF, COPD on nasal cannula, history of TIPS in 2018, cirrhosis with ascites. Artery Dr. Arnoldo Morale at [facility] has seen him multiple times and deemed the patient was too high risk for surgical intervention. Recently also went into A. fib with RVR  I have d/w critical care team and they will initiate amio given her tenuous HD. From What I can gather He was Child B classification before recent hospitalization. His INR and PL were normal. Albumin 3 and TB 2.3 Personal review his CT scan showing evidence of bowel obstruction from an strangulated small bowel within the umbilical hernia.  There is evidence of cirrhosis and ascites.    HPI  Past Medical History:  Diagnosis Date  . Alcohol use   . Anemia   . Aortic stenosis    mild  . Arthritis   . Atrial flutter ([place])   . Benign hypertrophy of prostate    with urinary retention; repaired hypospadia; transurethral resection of the prostate   . Cardiac conduction disorder    status post pacemaker implantation in 1995 generator replaced 2005  . Chest pain 2007, 2009   cardiac cath > normal coronary arterie; nl left ventricular function  . CHF (congestive heart failure) ([place])   . Cirrhosis ([place]) 11/2016  . Congenital deafness   . COPD (chronic obstructive pulmonary disease) ([place])   . Diabetes mellitus    +Insulin  . Dyspnea   . Edema   . GERD (gastroesophageal reflux disease)    status post multiple dilatations for stricture  . 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 Hepatitis    Thinks it was B  . History of kidney stones   . HOH (hard of hearing)   . HTN (hypertension)   . Hyperlipidemia    statin discontinued due to abn LFTs  . Hypothyroidism   . Mitral regurgitation    insignificant  . Obesity   . Obesity 4/08   BMI= 40  . Pelvic mass    identified in 2009, stable 2010  . PONV (postoperative nausea and vomiting)   . Presence of permanent cardiac pacemaker   . Sleep apnea    BiPAP and continuous oxygen  . Syncope   . Thyroid disease     Past Surgical History:  Procedure Laterality Date  . A-V CARDIAC PACEMAKER INSERTION  1995  . COLONOSCOPY  2008  . COLONOSCOPY N/A 06/23/2015   ERD:EYCXKGY diverticulosis  . ESOPHAGEAL BANDING N/A 12/22/2017   Procedure: ESOPHAGEAL BANDING;  Surgeon: Daneil Dolin, MD;  [facility];  Service: Endoscopy;  Laterality: N/A;  . ESOPHAGOGASTRODUODENOSCOPY N/A 06/23/2015   JEH:UDJS portal gastropathy  . ESOPHAGOGASTRODUODENOSCOPY (EGD) WITH PROPOFOL N/A 12/22/2017   Procedure: ESOPHAGOGASTRODUODENOSCOPY (EGD) WITH PROPOFOL;  Surgeon: Daneil Dolin, MD;  [facility];  Service: Endoscopy;  Laterality: N/A;  7:30am  . IR GENERIC HISTORICAL  12/14/2016   IR PARACENTESIS 12/14/2016 Sandi Mariscal, MD [facility]  . IR GENERIC HISTORICAL  12/14/2016   IR TIPS 12/14/2016 Sandi Mariscal, MD [facility]  . IR PARACENTESIS  01/12/2017  . IR RADIOLOGIST EVAL & MGMT  01/06/2017  . IR RADIOLOGIST EVAL & MGMT  02/08/2017  . IR RADIOLOGIST EVAL & MGMT  11/30/2016  . IR RADIOLOGIST EVAL & MGMT  09/22/2017  . IR TIPS  01/12/2017  . NASAL SINUS SURGERY     partially removed  . ORCHIECTOMY  1990   bilateral; ?neoplasm  . PACEMAKER INSERTION  05/2004; 10/04/2013   MDT dual chamber pacemaker; gen change 10/04/2013 (MDT ADDRL1)  . PERMANENT PACEMAKER GENERATOR CHANGE N/A 10/04/2013   Procedure: PERMANENT PACEMAKER GENERATOR CHANGE;  Surgeon: Evans Lance, MD;   Location: [facility] CATH LAB;  Service: Cardiovascular;  Laterality: N/A;  . RADIOLOGY WITH ANESTHESIA N/A 12/14/2016   Procedure: TIPS;  Surgeon: Sandi Mariscal, MD;  Location: [place];  Service: Radiology;  Laterality: N/A;  . RADIOLOGY WITH ANESTHESIA N/A 01/12/2017   Procedure: TIPS;  Surgeon: Sandi Mariscal, MD;  Location: [place];  Service: Radiology;  Laterality: N/A;  . REPAIR HYPOSPADIAS W/ URETHROPLASTY    . TONSILLECTOMY AND ADENOIDECTOMY    . TRANSURETHRAL RESECTION OF PROSTATE     for BPH    Family History  Problem Relation Age of Onset  . COPD Mother   . Hypertension Mother   . Congestive Heart Failure Mother   . Pneumonia Father   . Heart disease Other   . Hypertension Other   . Colon cancer Neg Hx     Social History Social History   Tobacco Use  . Smoking status: Never Smoker  . Smokeless tobacco: Never Used  . Tobacco comment: tobacco use- no   Substance Use Topics  . Alcohol use: No    Alcohol/week: 0.0 oz    Comment: Hx. of alcohol use  . Drug use: No    Allergies  Allergen Reactions  . Penicillins Swelling and Other (See Comments)    SWELLING OF AIRWAY  PATIENT HAD A PCN REACTION WITH IMMEDIATE RASH, FACIAL/TONGUE/THROAT SWELLING, SOB, OR LIGHTHEADEDNESS WITH HYPOTENSION:  #  #  #  YES  #  #  #  Has patient had a PCN reaction causing severe rash involving mucus membranes or skin necrosis: No Has patient had a PCN reaction that required hospitalization No Has patient had a PCN reaction occurring within the last 10 years: No  . Enalapril Cough  . Metformin And Related Diarrhea  . Ciprofloxacin     Itching and red rash up arm when infusing after 3rd cipro dose for uti on 10/27/13  . Eliquis [Apixaban] Other (See Comments)    DOSE RELATED BLEEDING    Current Facility-Administered Medications  Medication Dose Route Frequency Provider Last Rate Last Dose  . 0.9 %  sodium chloride infusion  10 mL/hr Intravenous Once Lance Coon, MD   Stopped at 01/08/2018 2159  . 0.9 %   sodium chloride infusion   Intravenous Continuous Lance Coon, MD 150 mL/hr at 12/26/2017 2249 150 mL/hr at 01/03/2018 2249  . [START ON 01/26/2018] insulin aspart (novoLOG) injection 0-9 Units  0-9 Units Subcutaneous Q6H Lance Coon, MD      . meropenem [name]) 1 g in sodium chloride 0.9 % 100 mL IVPB  1 g Intravenous Q12H Lance Coon, MD      . octreotide (SANDOSTATIN) 500 mcg in sodium chloride 0.9 % 250 mL (2 mcg/mL) infusion  50 mcg/hr Intravenous Continuous Lance Coon, MD  25 mL/hr at 12/30/2017 1917 50 mcg/hr at 01/16/2018 1917  . ondansetron (ZOFRAN) tablet 4 mg  4 mg Oral Q6H PRN Lance Coon, MD       Or  . ondansetron [facility]) injection 4 mg  4 mg Intravenous Q6H PRN Lance Coon, MD      . pantoprazole (PROTONIX) 80 mg in sodium chloride 0.9 % 250 mL (0.32 mg/mL) infusion  8 mg/hr Intravenous Continuous Lance Coon, MD 25 mL/hr at 01/08/2018 1914 8 mg/hr at 12/24/2017 1914  . [START ON 01/13/2018] pantoprazole (PROTONIX) injection 40 mg  40 mg Intravenous Laurence Spates, MD      . vancomycin (VANCOCIN) IVPB 1000 mg/200 mL premix  1,000 mg Intravenous Once Lance Coon, MD 200 mL/hr at 12/26/2017 2310 1,000 mg at 12/26/2017 2310  . [START ON 02/09/2018] vancomycin (VANCOCIN) IVPB 1000 mg/200 mL premix  1,000 mg Intravenous Q24H Lance Coon, MD         Review of Systems Full ROS  was asked and was negative except for the information on the HPI  Physical Exam Blood pressure 97/81, pulse (!) 154, temperature (!) 97.5 F (36.4 C), temperature source Oral, resp. rate (!) 24, height 5\' 6"  (1.676 m), weight 75.2 kg (165 lb 12.6 oz), SpO2 (!) 88 %. CONSTITUTIONAL: PT very debilitated EYES: Pupils are equal, round, and reactive to light, Sclera are non-icteric. EARS, NOSE, MOUTH AND THROAT: The oropharynx is clear. The oral mucosa is pink and moist. Hearing is intact to voice. LYMPH NODES:  Lymph nodes in the neck are normal. RESPIRATORY:  Lungs are clear. There is normal respiratory  effort, with equal breath sounds bilaterally, and without pathologic use of accessory muscles. CARDIOVASCULAR: A fib w HR 114s  S1,s,2. GI: The abdomen is  Tense and exquisitely tender to palpation w rebound tenderness. There is an strangulated Umbilical hernia w skin erythema . GU: Rectal deferred.   MUSCULOSKELETAL: Normal muscle strength and tone. No cyanosis or edema.   SKIN: Turgor is good and there are no pathologic skin lesions or ulcers. NEUROLOGIC: Motor and sensation is grossly normal. Cranial nerves are grossly intact. PSYCH:  Pt is awake but encephalopathic, follows simple commands but is unable to make his own decision due to encephalopaty  Data Reviewed  I have personally reviewed the patient's imaging, laboratory findings and medical records.    Assessment/Plan This is a very complex medical dilemma.  On one hand he has a true surgical emergency with an strangulated umbilical hernia containing bowel the other hand he is got advanced cirrhosis with at least child bleed classification none with exacerbation of atrial fibrillation and acute kidney injury. Discussed with the family in detail about my concerns for a very high operative mortality. On the other hand I firmly believe if we do not do any surgical intervention his mortality will be 100%. Lengthy discussion with his wife about this conundrum.  Those are too very difficult decisions.  At this time he is full code and she wishes to give him a shot, even if this implies prolonged hospitalization, prolonged ICU stay and ventilatory support, potential multiple operations and even transferring to different facility. At this point I do with not think that he is stable enough to be transferred to a tertiary facility Even though this is a very difficult situation I do respect the family wishes to give him a second chance.  I had discussed with the family and the patient as well as to with the anesthesiology team extensively about  his  medical disease process. This is a true medical emergency and will try to optimize to the best of our ability to include IV amiodarone, antibiotic prophylaxis judicious hemodynamic control and resuscitation. We will plan for emergent hernia repair being aware that this will be a very complicated case and will demand significant challenges perioperatively and we will face multiple complications. I do believe that there is a small chance that we can save his life and at this point taking him to the OR  is not a futile intervention. He remains full code and family wishes to do CPR and vent support as long as his condition can be potentially be reverse.  Caroleen Hamman, MD FACS General Surgeon 12/27/2017, 11:15 PM

## 2018-01-09 NOTE — Anesthesia Preprocedure Evaluation (Deleted)
Anesthesia Evaluation  Patient identified by MRN, date of birth, ID band Patient confused    Reviewed: Allergy & Precautions, H&P , NPO status , Patient's Chart, lab work & pertinent test results  History of Anesthesia Complications (+) PONV and history of anesthetic complications  Airway Mallampati: III  TM Distance: <3 FB Neck ROM: limited    Dental  (+) Chipped, Poor Dentition, Missing   Pulmonary shortness of breath, sleep apnea , COPD,           Cardiovascular Exercise Tolerance: Poor hypertension, + Peripheral Vascular Disease and +CHF  + dysrhythmias Atrial Fibrillation + pacemaker + Valvular Problems/Murmurs AS      Neuro/Psych negative neurological ROS  negative psych ROS   GI/Hepatic negative GI ROS, GERD  ,(+) Hepatitis -  Endo/Other  diabetes, Type 2Hypothyroidism   Renal/GU Renal disease     Musculoskeletal  (+) Arthritis ,   Abdominal   Peds  Hematology negative hematology ROS (+)   Anesthesia Other Findings Past Medical History: No date: Alcohol use No date: Anemia No date: Aortic stenosis     Comment:  mild No date: Arthritis No date: Atrial flutter ([facility]) No date: Benign hypertrophy of prostate     Comment:  with urinary retention; repaired hypospadia;               transurethral resection of the prostate  No date: Cardiac conduction disorder     Comment:  status post pacemaker implantation in 1995 generator               replaced 2005 2007, 2009: Chest pain     Comment:  cardiac cath > normal coronary arterie; nl left               ventricular function No date: CHF (congestive heart failure) ([place]) 11/2016: Cirrhosis ([place]) No date: Congenital deafness No date: COPD (chronic obstructive pulmonary disease) ([place]) No date: Diabetes mellitus     Comment:  +Insulin No date: Dyspnea No date: Edema No date: GERD (gastroesophageal reflux disease)     Comment:  status post multiple dilatations  for stricture No date: Hepatic disease     Comment:  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 chronic active hepatitis aw cirrhosis;               inconclusive biospy in 1/10 No date: Hepatitis     Comment:  Thinks it was B No date: History of kidney stones No date: HOH (hard of hearing) No date: HTN (hypertension) No date: Hyperlipidemia     Comment:  statin discontinued due to abn LFTs No date: Hypothyroidism No date: Mitral regurgitation     Comment:  insignificant No date: Obesity 4/08: Obesity     Comment:  BMI= 40 No date: Pelvic mass     Comment:  identified in 2009, stable 2010 No date: PONV (postoperative nausea and vomiting) No date: Presence of permanent cardiac pacemaker No date: Sleep apnea     Comment:  BiPAP and continuous oxygen No date: Syncope No date: Thyroid disease  Past Surgical History: 1995: A-V CARDIAC PACEMAKER INSERTION 2008: COLONOSCOPY 06/23/2015: COLONOSCOPY; N/A     Comment:  [id number] diverticulosis 12/22/2017: ESOPHAGEAL BANDING; N/A     Comment:  Procedure: ESOPHAGEAL BANDING;  Surgeon: Daneil Dolin, MD;  [facility];  Service: Endoscopy;                Laterality: N/A; 06/23/2015:  ESOPHAGOGASTRODUODENOSCOPY; N/A     Comment:  DDU:KGUR portal gastropathy 12/22/2017: ESOPHAGOGASTRODUODENOSCOPY (EGD) WITH PROPOFOL; N/A     Comment:  Procedure: ESOPHAGOGASTRODUODENOSCOPY (EGD) WITH               PROPOFOL;  Surgeon: Daneil Dolin, MD;  [facility];  Service: Endoscopy;  Laterality: N/A;                7:30am 12/14/2016: IR GENERIC HISTORICAL     Comment:  IR PARACENTESIS 12/14/2016 Sandi Mariscal, MD [facility] 12/14/2016: IR GENERIC HISTORICAL     Comment:  IR TIPS 12/14/2016 Sandi Mariscal, MD [facility] 01/12/2017: IR PARACENTESIS 01/06/2017: IR RADIOLOGIST EVAL & MGMT 02/08/2017: IR RADIOLOGIST EVAL & MGMT 11/30/2016: IR RADIOLOGIST EVAL & MGMT 09/22/2017: IR RADIOLOGIST EVAL & MGMT 01/12/2017: IR TIPS No date: NASAL  SINUS SURGERY     Comment:  partially removed 1990: ORCHIECTOMY     Comment:  bilateral; ?neoplasm 05/2004; 10/04/2013: PACEMAKER INSERTION     Comment:  MDT dual chamber pacemaker; gen change 10/04/2013 (MDT               ADDRL1) 10/04/2013: PERMANENT PACEMAKER GENERATOR CHANGE; N/A     Comment:  Procedure: PERMANENT PACEMAKER GENERATOR CHANGE;                Surgeon: Evans Lance, MD;  Location: [place] CATH LAB;                Service: Cardiovascular;  Laterality: N/A; 12/14/2016: RADIOLOGY WITH ANESTHESIA; N/A     Comment:  Procedure: TIPS;  Surgeon: Sandi Mariscal, MD;  Location: [place];  Service: Radiology;  Laterality: N/A; 01/12/2017: RADIOLOGY WITH ANESTHESIA; N/A     Comment:  Procedure: TIPS;  Surgeon: Sandi Mariscal, MD;  Location: [place];  Service: Radiology;  Laterality: N/A; No date: REPAIR HYPOSPADIAS W/ URETHROPLASTY No date: TONSILLECTOMY AND ADENOIDECTOMY No date: TRANSURETHRAL RESECTION OF PROSTATE     Comment:  for BPH  BMI    Body Mass Index:  26.76 kg/m      Reproductive/Obstetrics negative OB ROS                             Anesthesia Physical Anesthesia Plan  ASA: IV and emergent  Anesthesia Plan: General ETT, Rapid Sequence and Cricoid Pressure   Post-op Pain Management:    Induction: Intravenous  PONV Risk Score and Plan:   Airway Management Planned: Oral ETT and Video Laryngoscope Planned  Additional Equipment:   Intra-op Plan:   Post-operative Plan: Post-operative intubation/ventilation  Informed Consent: I have reviewed the patients History and Physical, chart, labs and discussed the procedure including the risks, benefits and alternatives for the proposed anesthesia with the patient or authorized representative who has indicated his/her understanding and acceptance.   Dental Advisory Given  Plan Discussed with: Anesthesiologist, CRNA and Surgeon  Anesthesia Plan Comments: (Family informed that  patient is higher risk for complications from anesthesia during this procedure due to their medical history.  Family voiced understanding.  Family consented for risks of anesthesia including but not limited to:  - adverse reactions to medications - damage to teeth, lips or other oral mucosa - sore throat or  hoarseness - Damage to heart, brain, lungs or loss of life  Family voiced understanding.)        Anesthesia Quick Evaluation

## 2018-01-09 NOTE — Consult Note (Signed)
PULMONARY / CRITICAL CARE MEDICINE   Name: [name] MRN: [id number] DOB: June 04, 1961    ADMISSION DATE:  01/04/2018   CONSULTATION DATE:  01/11/2018  REFERRING MD: Dr. Jannifer Franklin  Reason: Acute GI bleed  HISTORY OF PRESENT ILLNESS: This is a 57 year old male with a medical history as indicated below who presented to the ED with complaints of coffee-ground emesis, and altered mental status.  Time started 5 days ago with nausea and then progressed to decreased oral intake and emesis.  Patient has not been able to take his prescribed medications and had daily episodes of emesis.  He has a stoma over his abdominal hernia and yesterday his wife states that he she noted some blood in the stoma.  Today patient became very confused and agitated hence they decided to come to the emergency room for evaluation.  At the emergency room, patient continued to have coffee-ground emesis and abdominal pain but no bloody stools.  His CT abdomen suggested high-grade small bowel obstruction with strangulation at the level of the umbilical hernia.  He was also noted to have bibasilar airspace opacities suggestive of multifocal pneumonia and/or moderate volume ascites. Of note, patient has [name] cirrhosis with ascites, status post tips procedure in 2018, and recent EGD on April 2019 with gastropathy with no varices Stat surgical consult was initiated.  After discussing the risk and benefits of surgery with the family, they opted for no surgical intervention and made the patient DNR/DNI. He currently has an NG tube to low intermittent  Suction and NG tube continues to put out dark drainage. PAST MEDICAL HISTORY :  He  has a past medical history of Alcohol use, Anemia, Aortic stenosis, Arthritis, Atrial flutter ([place]), Benign hypertrophy of prostate, Cardiac conduction disorder, Chest pain (2007, 2009), CHF (congestive heart failure) ([place]), Cirrhosis ([place]) (11/2016), Congenital deafness, COPD (chronic obstructive pulmonary  disease) ([place]), Diabetes mellitus, Dyspnea, Edema, GERD (gastroesophageal reflux disease), Hepatic disease, Hepatitis, History of kidney stones, HOH (hard of hearing), HTN (hypertension), Hyperlipidemia, Hypothyroidism, Mitral regurgitation, Obesity, Obesity (4/08), Pelvic mass, PONV (postoperative nausea and vomiting), Presence of permanent cardiac pacemaker, Sleep apnea, Syncope, and Thyroid disease.  PAST SURGICAL HISTORY: He  has a past surgical history that includes Orchiectomy (1990); Repair hypospadias w/ urethroplasty; Transurethral resection of prostate; Tonsillectomy and adenoidectomy; A-V cardiac pacemaker insertion (1995); Colonoscopy (2008); Pacemaker insertion (05/2004; 10/04/2013); Permanent pacemaker generator change (N/A, 10/04/2013); Colonoscopy (N/A, 06/23/2015); Esophagogastroduodenoscopy (N/A, 06/23/2015); Nasal sinus surgery; ir generic historical (12/14/2016); ir generic historical (12/14/2016); Radiology with anesthesia (N/A, 12/14/2016); IR Tips (01/12/2017); IR Paracentesis (01/12/2017); Radiology with anesthesia (N/A, 01/12/2017); IR Radiologist Eval & Mgmt (01/06/2017); IR Radiologist Eval & Mgmt (02/08/2017); IR Radiologist Eval & Mgmt (11/30/2016); IR Radiologist Eval & Mgmt (09/22/2017); Esophagogastroduodenoscopy (egd) with propofol (N/A, 12/22/2017); and esophageal banding (N/A, 12/22/2017).  Allergies  Allergen Reactions  . Penicillins Swelling and Other (See Comments)    SWELLING OF AIRWAY  PATIENT HAD A PCN REACTION WITH IMMEDIATE RASH, FACIAL/TONGUE/THROAT SWELLING, SOB, OR LIGHTHEADEDNESS WITH HYPOTENSION:  #  #  #  YES  #  #  #  Has patient had a PCN reaction causing severe rash involving mucus membranes or skin necrosis: No Has patient had a PCN reaction that required hospitalization No Has patient had a PCN reaction occurring within the last 10 years: No  . Enalapril Cough  . Metformin And Related Diarrhea  . Ciprofloxacin     Itching and red rash up arm when infusing after 3rd  cipro dose for uti  on 10/27/13  . Eliquis [Apixaban] Other (See Comments)    DOSE RELATED BLEEDING    No current facility-administered medications on file prior to encounter.    Current Outpatient Medications on File Prior to Encounter  Medication Sig  . acetaminophen (TYLENOL) 325 MG tablet Take 325 mg by mouth every 6 (six) hours as needed for mild pain or headache.   . albuterol (PROVENTIL HFA;VENTOLIN HFA) 108 (90 Base) MCG/ACT inhaler Inhale 2 puffs into the lungs every 6 (six) hours as needed for wheezing or shortness of breath.  Marland Kitchen albuterol (PROVENTIL) (2.5 MG/3ML) 0.083% nebulizer solution Take 2.5 mg by nebulization every 6 (six) hours as needed for wheezing or shortness of breath.  . Calcium Carb-Cholecalciferol (CALCIUM 600+D) 600-800 MG-UNIT TABS Take 1 tablet by mouth 2 (two) times daily.   . cetirizine (ZYRTEC) 10 MG tablet Take 10 mg by mouth daily as needed for allergies.   . feeding supplement, ENSURE ENLIVE, (ENSURE ENLIVE) LIQD Take 237 mLs by mouth 3 (three) times daily between meals. (Patient taking differently: Take 237 mLs by mouth 3 (three) times daily as needed (for poor nutrition.). )  . furosemide (LASIX) 20 MG tablet Take 1 tablet (20 mg total) daily by mouth.  Marland Kitchen gentamicin ointment (GARAMYCIN) 0.1 % Apply 1 application topically 3 (three) times daily as needed (for nose bleeds).   . insulin aspart protamine- aspart (NOVOLOG MIX 70/30) (70-30) 100 UNIT/ML injection Inject 2-4 Units into the skin 2 (two) times daily. 4 units with breakfast & 2 units with supper  . lactulose (CHRONULAC) 10 GM/15ML solution 10cc (2 teaspoons) three times daily; Titrate to 2-3 semi formed stools a day (Patient taking differently: Take 10 g by mouth See admin instructions. 10cc (2 teaspoons) three times daily; Titrate to 2-3 semi formed stools a day)  . levothyroxine (LEVOXYL) 50 MCG tablet Take 50 mcg by mouth daily before breakfast.   . metoprolol succinate (TOPROL-XL) 25 MG 24 hr tablet  Take 1 tablet (25 mg total) daily by mouth.  . modafinil (PROVIGIL) 200 MG tablet Take 200 mg by mouth daily.   . mometasone (NASONEX) 50 MCG/ACT nasal spray Place 2 sprays into the nose daily as needed (for congestion/allergies).   . Multiple Vitamins-Minerals (CENTRUM) tablet Take 1 tablet by mouth daily.   . nitroGLYCERIN (NITROSTAT) 0.4 MG SL tablet Place 1 tablet (0.4 mg total) under the tongue every 5 (five) minutes as needed for chest pain. (Patient taking differently: Place 0.4 mg under the tongue every 5 (five) minutes x 3 doses as needed for chest pain. )  . omeprazole (PRILOSEC) 40 MG capsule TAKE 1 CAPSULE BY MOUTH ONCE DAILY FOR REFLUX.  Marland Kitchen spironolactone (ALDACTONE) 50 MG tablet Take 1 tablet (50 mg total) daily by mouth.  . ursodiol (ACTIGALL) 500 MG tablet TAKE 1 TABLET BY MOUTH TWICE DAILY  . XIFAXAN 550 MG TABS tablet TAKE 1 TABLET BY MOUTH TWICE DAILY (Patient taking differently: TAKE 0.5 TABLET BY MOUTH TWICE DAILY)    FAMILY HISTORY:  His indicated that his mother is deceased. He indicated that his father is deceased. He indicated that his sister is alive. He reported the following about his brother: 2 brothers with heart problems. He indicated that the status of his neg hx is unknown. He reported the following about one of his others: sibling.   SOCIAL HISTORY: He  reports that he has never smoked. He has never used smokeless tobacco. He reports that he does not drink alcohol or use drugs.  REVIEW OF SYSTEMS:   Unable to obtain due to patient's altered mental status  SUBJECTIVE:   VITAL SIGNS: BP 99/76   Pulse (!) 176   Temp (!) 97.5 F (36.4 C) (Oral)   Resp (!) 26   Ht 5\' 6"  (1.676 m)   Wt 165 lb 12.6 oz (75.2 kg)   SpO2 (!) 88%   BMI 26.76 kg/m   HEMODYNAMICS:    VENTILATOR SETTINGS:    INTAKE / OUTPUT: No intake/output data recorded.  PHYSICAL EXAMINATION: General: Appears acutely ill, in moderate distress Neuro: Confused, hard of hearing, moves  all extremities, follows some basic commands HEENT: PERRLA, trachea midline, no JVD Cardiovascular: Apical pulse irregular, tachycardic, S1-S2, no murmur regurg or gallop, +2 pulses bilaterally Lungs: Lateral breath sounds with diffuse rhonchi in anterior lung fields and  diminished breath sounds in the bases Abdomen: Hypoactive bowel sounds, umbilical hernia with protruding golf ball size swelling with extensive surrounding erythema and tenderness on gentle palpation Musculoskeletal: Positive range of motion in upper and lower extremities no joint deformities Skin: Multiple bruises and large shin warm and right lower extremity with eschar tissue and mild slough on the wound bed  LABS:  BMET Recent Labs  Lab 01/08/2018 1829  NA 136  K 4.9  CL 99*  CO2 24  BUN 46*  CREATININE 1.73*  GLUCOSE 177*    Electrolytes Recent Labs  Lab 12/28/2017 1829  CALCIUM 9.2    CBC Recent Labs  Lab 12/24/2017 1829  WBC 19.2*  HGB 13.9  HCT 41.4  PLT 432    Coag's Recent Labs  Lab 01/14/2018 1830  APTT 37*  INR 1.42    Sepsis Markers No results for input(s): LATICACIDVEN, PROCALCITON, O2SATVEN in the last 168 hours.  ABG No results for input(s): PHART, PCO2ART, PO2ART in the last 168 hours.  Liver Enzymes Recent Labs  Lab 12/31/2017 1829  AST 105*  ALT 71*  ALKPHOS 552*  BILITOT 2.9*  ALBUMIN 2.8*    Cardiac Enzymes Recent Labs  Lab 12/27/2017 1830  TROPONINI 0.09*    Glucose Recent Labs  Lab 01/03/2018 2212  GLUCAP 165*    Imaging Ct Abdomen Pelvis W Contrast  Result Date: 12/28/2017 CLINICAL DATA:  Acute onset of vomiting.  Altered mental status. EXAM: CT ABDOMEN AND PELVIS WITH CONTRAST TECHNIQUE: Multidetector CT imaging of the abdomen and pelvis was performed using the standard protocol following bolus administration of intravenous contrast. CONTRAST:  69mL OMNIPAQUE IOHEXOL 300 MG/ML  SOLN COMPARISON:  CT of the abdomen and pelvis from 10/02/2017 FINDINGS: Lower  chest: Bibasilar airspace opacities, right greater than left, are compatible with multifocal pneumonia. Trace bilateral pleural effusions are noted. Pacemaker leads are partially imaged. The heart is mildly enlarged. Hepatobiliary: There is a diffusely nodular contour of the liver, reflecting hepatic cirrhosis. A TIPS is noted, and grossly unremarkable in appearance. The gallbladder is grossly unremarkable. The common bile duct remains normal in caliber. Pancreas: The pancreas is within normal limits. Spleen: The spleen is unremarkable in appearance. Adrenals/Urinary Tract: The adrenal glands are unremarkable in appearance. Right renal scarring is noted. Nonspecific perinephric stranding is noted bilaterally. There is no evidence of hydronephrosis. No renal or ureteral stones are identified. Stomach/Bowel: There is dilatation of small-bowel loops up to 4.3 cm in maximal diameter, with a focal transition point at the herniation of mid to distal ileum at the patient's moderate to large umbilical hernia. There is decompression of distal bowel loops, and free fluid is noted within the  hernia, raising concern for obstruction and strangulation. [name] enteric tube is noted ending at the second segment of the duodenum. The stomach is largely filled with fluid. The appendix is normal in caliber, without evidence of appendicitis. The colon is unremarkable in appearance. Vascular/Lymphatic: Scattered calcification is seen along the abdominal aorta and its branches. The abdominal aorta is otherwise grossly unremarkable. The inferior vena cava is grossly unremarkable. No retroperitoneal lymphadenopathy is seen. No pelvic sidewall lymphadenopathy is identified. Reproductive: The bladder is mildly distended and grossly unremarkable. A bicornuate uterus is noted. No suspicious adnexal masses are seen. Other: Moderate volume ascites is seen within the abdomen and pelvis. Musculoskeletal: No acute osseous abnormalities are  identified. The visualized musculature is unremarkable in appearance. IMPRESSION: 1. Dilatation of small-bowel loops to 4.3 cm in maximal diameter, with focal transition point at herniated mid to distal ileum at the moderate to large umbilical hernia. Free fluid noted within the hernia. Findings are concerning for high-grade small bowel obstruction and strangulation. 2. Bibasilar airspace opacities, right greater than left, compatible with multifocal pneumonia. Trace bilateral pleural effusions noted. 3. Moderate volume ascites again noted within the abdomen and pelvis. 4. Hepatic cirrhosis; TIPS is grossly unremarkable in appearance. 5. Mild cardiomegaly. Aortic Atherosclerosis (ICD10-I70.0). These results were called by telephone at the time of interpretation on 01/06/2018 at 10:23 pm to Dr. Lance Coon, who verbally acknowledged these results. Electronically Signed   By: Garald Balding M.D.   On: 01/04/2018 22:26   Dg Chest Portable 1 View  Result Date: 12/29/2017 CLINICAL DATA:  Hypotension. EXAM: PORTABLE CHEST 1 VIEW COMPARISON:  04/15/2017 FINDINGS: Stable cardiac enlargement and appearance of dual-chamber pacemaker. Pacing pads present. Lung volumes are low bilaterally. There is no evidence of pulmonary edema, consolidation, pneumothorax, nodule or pleural fluid. There is significant gaseous distention of the stomach. IMPRESSION: No acute findings with stable cardiomegaly and radiographic appearance of pacemaker. Significant gaseous distention of the stomach noted. Electronically Signed   By: Aletta Edouard M.D.   On: 01/14/2018 19:51   STUDIES:  None  CULTURES: None  ANTIBIOTICS: Meropenem Flagyl Vancomycin  SIGNIFICANT EVENTS: 12/29/2017: Admitted  LINES/TUBES: Foley catheter Peripheral IVs  DISCUSSION: 57 year old male presenting with a high-grade small bowel obstruction with strangulation  ASSESSMENT  High-grade small bowel obstruction with strangulation Umbilical hernia   Bilateral pneumonia Cirrhosis with ascites Acute renal failure Nausea and vomiting GI bleed Possible intra-abdominal infection Lactic acidosis Acute COPD exacerbation A. fib with RVR  PLAN Hemodynamic monitoring per ICU protocol Amiodarone bolus with infusion Family does not want any surgical intervention Oral intake as tolerated for comfort IV antibiotics as tolerate trend creatinine lactic acid Morphine as needed for dyspnea Robinul for secretions Neurology following GI and DVT prophylaxis DNR/DNI  FAMILY  - Updates: Family updated at bedside.  All questions answered.  Call wife immediately if patient takes a turn for the worse - Inter-disciplinary family meet or Palliative Care meeting due by:  day [place]. [facility] ANP-BC Pulmonary and Critical Care Medicine [facility] Pager [phone] or [phone]  NB: This document was prepared using Dragon voice recognition software and may include unintentional dictation errors.   01/03/2018, 10:44 PM

## 2018-01-09 NOTE — ED Provider Notes (Signed)
[facility] Emergency Department Provider Note  ____________________________________________  Time seen: Approximately 7:09 PM  I have reviewed the triage vital signs and the nursing notes.   HISTORY  Chief Complaint Altered Mental Status and Emesis  Level 5 caveat:  Portions of the history and physical were unable to be obtained due to AMS and deafness   HPI [name] is a 57 y.o. male with h/o NASH cirrhosis c/b ascites, recent EGD on 12/2017 with gastropathy but no varices, s/p TIPS in 2018, pacemaker, CHF, COPD on 4L [place], congenital deafness, atrial flutter not on blood thinners, DM, HTN who presents for evaluation of altered mental status.  According to patient's wife for the last 5 days patient has had nausea, decreased oral intake, has not been taking his medications as prescribed.  He has been having several daily episodes of nonbloody nonbilious emesis.  Yesterday wife noted bloody stool in his ostomy. According to wife patient stools in the ostomy and also rectally. Has been taking lactulose occasionally.  Today patient became very confused and agitated which prompted the visit to the emergency room.  Patient started vomiting coffee-ground emesis while in the waiting room.  No melena or diarrhea. Patient complains of abdominal pain.   Past Medical History:  Diagnosis Date  . Alcohol use   . Anemia   . Aortic stenosis    mild  . Arthritis   . Atrial flutter ([place])   . Benign hypertrophy of prostate    with urinary retention; repaired hypospadia; transurethral resection of the prostate   . Cardiac conduction disorder    status post pacemaker implantation in 1995 generator replaced 2005  . Chest pain 2007, 2009   cardiac cath > normal coronary arterie; nl left ventricular function  . CHF (congestive heart failure) ([place])   . Cirrhosis ([place]) 11/2016  . Congenital deafness   . COPD (chronic obstructive pulmonary disease) ([place])   . Diabetes  mellitus    +Insulin  . Dyspnea   . Edema   . GERD (gastroesophageal reflux disease)    status post multiple dilatations for stricture  . 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 Hepatitis    Thinks it was B  . History of kidney stones   . HOH (hard of hearing)   . HTN (hypertension)   . Hyperlipidemia    statin discontinued due to abn LFTs  . Hypothyroidism   . Mitral regurgitation    insignificant  . Obesity   . Obesity 4/08   BMI= 40  . Pelvic mass    identified in 2009, stable 2010  . PONV (postoperative nausea and vomiting)   . Presence of permanent cardiac pacemaker   . Sleep apnea    BiPAP and continuous oxygen  . Syncope   . Thyroid disease     Patient Active Problem List   Diagnosis Date Noted  . Atrial fibrillation with RVR ([place]) 01/14/2018  . GI bleed 01/05/2018  . PAD (peripheral artery disease) ([place]) 12/22/2017  . Umbilical hernia without obstruction and without gangrene 11/10/2017  . S/P TIPS (transjugular intrahepatic portosystemic shunt)   . Transaminitis   . Hepatic encephalopathy ([place]) 04/19/2017  . Decompensated liver disease ([place]) 04/19/2017  . History of head injury 01/27/2017  . Malnutrition of moderate degree 12/24/2016  . Chronic respiratory failure with hypoxia ([place]) 12/24/2016  . COPD (chronic obstructive pulmonary disease) ([place]) 12/23/2016  . Liver cirrhosis secondary  to NASH ([place]) 12/14/2016  . Anemia 11/11/2016  . AKI (acute kidney injury) ([place]) 11/11/2016  . Hyperkalemia 11/11/2016  . Hypothyroidism 07/15/2016  . Elevated alkaline phosphatase level 06/14/2016  . Elevated LFTs 06/14/2016  . Ascites   . Diarrhea 02/29/2016  . Abnormal liver function 02/29/2016  . Atrial flutter ([place]) 02/29/2016  . Hypoglycemia 02/29/2016  . Abdominal pain 02/29/2016  . Jejunitis   . Adenomatous colon polyp 09/01/2015  . Portal hypertensive gastropathy ([place])   . Heme + stool 06/02/2015    . Hematuria 10/26/2013  . HTN (hypertension)   . Atrial fibrillation ([place]) 10/19/2013  . Chronic diastolic heart failure ([place]) 09/19/2013  . Chest pain   . Aortic stenosis   . Hypertension   . Hyperlipidemia   . Pelvic mass   . Cirrhosis of liver with ascites ([place])   . Benign hypertrophy of prostate   . Sleep apnea   . GASTROESOPHAGEAL REFLUX DISEASE 05/19/2010  . Presence of permanent cardiac pacemaker 03/18/2010  . Type 2 diabetes mellitus ([place]) 06/27/2008  . ANEMIA, MILD 06/27/2008  . Deafness congenital 06/27/2008  . CONDUCTION DISORDER OF THE HEART 06/27/2008    Past Surgical History:  Procedure Laterality Date  . A-V CARDIAC PACEMAKER INSERTION  1995  . COLONOSCOPY  2008  . COLONOSCOPY N/A 06/23/2015   KVQ:QVZDGLO diverticulosis  . ESOPHAGEAL BANDING N/A 12/22/2017   Procedure: ESOPHAGEAL BANDING;  Surgeon: Daneil Dolin, MD;  Location[facility];  Service: Endoscopy;  Laterality: N/A;  . ESOPHAGOGASTRODUODENOSCOPY N/A 06/23/2015   VFI:EPPI portal gastropathy  . ESOPHAGOGASTRODUODENOSCOPY (EGD) WITH PROPOFOL N/A 12/22/2017   Procedure: ESOPHAGOGASTRODUODENOSCOPY (EGD) WITH PROPOFOL;  Surgeon: Daneil Dolin, MD;  Location[facility];  Service: Endoscopy;  Laterality: N/A;  7:30am  . IR GENERIC HISTORICAL  12/14/2016   IR PARACENTESIS 12/14/2016 Sandi Mariscal, MD [facility]  . IR GENERIC HISTORICAL  12/14/2016   IR TIPS 12/14/2016 Sandi Mariscal, MD [facility]  . IR PARACENTESIS  01/12/2017  . IR RADIOLOGIST EVAL & MGMT  01/06/2017  . IR RADIOLOGIST EVAL & MGMT  02/08/2017  . IR RADIOLOGIST EVAL & MGMT  11/30/2016  . IR RADIOLOGIST EVAL & MGMT  09/22/2017  . IR TIPS  01/12/2017  . NASAL SINUS SURGERY     partially removed  . ORCHIECTOMY  1990   bilateral; ?neoplasm  . PACEMAKER INSERTION  05/2004; 10/04/2013   MDT dual chamber pacemaker; gen change 10/04/2013 (MDT ADDRL1)  . PERMANENT PACEMAKER GENERATOR CHANGE N/A 10/04/2013   Procedure: PERMANENT PACEMAKER GENERATOR  CHANGE;  Surgeon: Evans Lance, MD;  Location: [facility] CATH LAB;  Service: Cardiovascular;  Laterality: N/A;  . RADIOLOGY WITH ANESTHESIA N/A 12/14/2016   Procedure: TIPS;  Surgeon: Sandi Mariscal, MD;  Location: [place];  Service: Radiology;  Laterality: N/A;  . RADIOLOGY WITH ANESTHESIA N/A 01/12/2017   Procedure: TIPS;  Surgeon: Sandi Mariscal, MD;  Location: [place];  Service: Radiology;  Laterality: N/A;  . REPAIR HYPOSPADIAS W/ URETHROPLASTY    . TONSILLECTOMY AND ADENOIDECTOMY    . TRANSURETHRAL RESECTION OF PROSTATE     for BPH    Prior to Admission medications   Medication Sig Start Date End Date Taking? Authorizing Provider  acetaminophen (TYLENOL) 325 MG tablet Take 325 mg by mouth every 6 (six) hours as needed for mild pain or headache.    Yes [provider]  albuterol (PROVENTIL HFA;VENTOLIN HFA) 108 (90 Base) MCG/ACT inhaler Inhale 2 puffs into the lungs every 6 (six) hours  as needed for wheezing or shortness of breath.   Yes [provider]  albuterol (PROVENTIL) (2.5 MG/3ML) 0.083% nebulizer solution Take 2.5 mg by nebulization every 6 (six) hours as needed for wheezing or shortness of breath.   Yes [provider]  Calcium Carb-Cholecalciferol (CALCIUM 600+D) 600-800 MG-UNIT TABS Take 1 tablet by mouth 2 (two) times daily.    Yes [provider]  cetirizine (ZYRTEC) 10 MG tablet Take 10 mg by mouth daily as needed for allergies.    Yes [provider]  feeding supplement, ENSURE ENLIVE, (ENSURE ENLIVE) LIQD Take 237 mLs by mouth 3 (three) times daily between meals. Patient taking differently: Take 237 mLs by mouth 3 (three) times daily as needed (for poor nutrition.).  04/19/17  Yes Dhungel, Nishant, MD  furosemide (LASIX) 20 MG tablet Take 1 tablet (20 mg total) daily by mouth. 08/04/17  Yes Branch, Alphonse Guild, MD  gentamicin ointment (GARAMYCIN) 0.1 % Apply 1 application topically 3 (three) times daily as needed (for nose bleeds).    Yes [provider]  insulin aspart protamine- aspart (NOVOLOG MIX 70/30) (70-30) 100 UNIT/ML injection Inject 2-4 Units into the skin 2 (two) times daily. 4 units with breakfast & 2 units with supper   Yes [provider]  lactulose (CHRONULAC) 10 GM/15ML solution 10cc (2 teaspoons) three times daily; Titrate to 2-3 semi formed stools a day Patient taking differently: Take 10 g by mouth See admin instructions. 10cc (2 teaspoons) three times daily; Titrate to 2-3 semi formed stools a day 05/25/17  Yes Annitta Needs, NP  levothyroxine (LEVOXYL) 50 MCG tablet Take 50 mcg by mouth daily before breakfast.    Yes [provider]  metoprolol succinate (TOPROL-XL) 25 MG 24 hr tablet Take 1 tablet (25 mg total) daily by mouth. 08/04/17  Yes Branch, Alphonse Guild, MD  modafinil (PROVIGIL) 200 MG tablet Take 200 mg by mouth daily.  01/13/15  Yes [provider]  mometasone (NASONEX) 50 MCG/ACT nasal spray Place 2 sprays into the nose daily as needed (for congestion/allergies).    Yes [provider]  Multiple Vitamins-Minerals (CENTRUM) tablet Take 1 tablet by mouth daily.    Yes [provider]  nitroGLYCERIN (NITROSTAT) 0.4 MG SL tablet Place 1 tablet (0.4 mg total) under the tongue every 5 (five) minutes as needed for chest pain. Patient taking differently: Place 0.4 mg under the tongue every 5 (five) minutes x 3 doses as needed for chest pain.  06/26/15  Yes Lendon Colonel, NP  omeprazole (PRILOSEC) 40 MG capsule TAKE 1 CAPSULE BY MOUTH ONCE DAILY FOR REFLUX. 12/21/17  Yes Branch, Alphonse Guild, MD  spironolactone (ALDACTONE) 50 MG tablet Take 1 tablet (50 mg total) daily by mouth. 08/04/17  Yes Branch, Alphonse Guild, MD  ursodiol (ACTIGALL) 500 MG tablet TAKE 1 TABLET BY MOUTH TWICE DAILY 12/09/17  Yes Carlis Stable, NP  XIFAXAN 550 MG TABS tablet TAKE 1 TABLET BY MOUTH TWICE DAILY Patient taking differently: TAKE 0.5 TABLET BY MOUTH TWICE DAILY 08/31/17  Yes Annitta Needs, NP      Allergies Penicillins; Enalapril; Metformin and related; Ciprofloxacin; and Eliquis [apixaban]  Family History  Problem Relation Age of Onset  . COPD Mother   . Hypertension Mother   . Congestive Heart Failure Mother   . Pneumonia Father   . Heart disease Other   . Hypertension Other   . Colon cancer Neg Hx     Social History Social History  Tobacco Use  . Smoking status: Never Smoker  . Smokeless tobacco: Never Used  . Tobacco comment: tobacco use- no   Substance Use Topics  . Alcohol use: No    Alcohol/week: 0.0 oz    Comment: Hx. of alcohol use  . Drug use: No    Review of Systems  Constitutional: Negative for fever. + AMS Eyes: Negative for visual changes. ENT: Negative for sore throat. Neck: No neck pain  Cardiovascular: Negative for chest pain. Respiratory: Negative for shortness of breath. Gastrointestinal: + abdominal pain, vomiting and bloody stool Genitourinary: Negative for dysuria. Musculoskeletal: Negative for back pain. Skin: Negative for rash. Neurological: Negative for headaches, weakness or numbness. Psych: No SI or HI  ____________________________________________   PHYSICAL EXAM:  VITAL SIGNS: ED Triage Vitals  Enc Vitals Group     BP 01/07/2018 1828 (!) 75/44     Pulse Rate 01/14/2018 1828 (!) 169     Resp 12/25/2017 1828 (!) 21     Temp --      Temp src --      SpO2 01/06/2018 1828 94 %     Weight 01/06/2018 1821 179 lb (81.2 kg)     Height 12/19/2017 1821 5\' 3"  (1.6 m)     Head Circumference --      Peak Flow --      Pain Score 01/01/2018 1821 10     Pain Loc --      Pain Edu? --      Excl. in [place]? --     Constitutional: Awake, non verbal, patient looks sick, no distress, actively vomiting coffee ground HEENT:      Head: Normocephalic and atraumatic.         Eyes: Conjunctivae are normal. Sclera is icteric.       Mouth/Throat: Mucous membranes are dry       Neck: Supple with no signs of meningismus. Cardiovascular: Irregularly  irregular rhythm with tachycardic rate  Respiratory: Normal respiratory effort. Lungs are clear to auscultation bilaterally. No wheezes, crackles, or rhonchi.  Gastrointestinal: Distended, non tender, ostomy bag is empty Musculoskeletal: Nontender with normal range of motion in all extremities. No edema, cyanosis, or erythema of extremities. Neurologic:  Face is symmetric. Moving all extremities.  Skin: Skin is warm, dry and intact. No rash noted. Psychiatric: Mood and affect are normal. Speech and behavior are normal.  ____________________________________________   LABS (all labs ordered are listed, but only abnormal results are displayed)  Labs Reviewed  COMPREHENSIVE METABOLIC PANEL - Abnormal; Notable for the following components:      Result Value   Chloride 99 (*)    Glucose, Bld 177 (*)    BUN 46 (*)    Creatinine, Ser 1.73 (*)    Total Protein 5.9 (*)    Albumin 2.8 (*)    AST 105 (*)    ALT 71 (*)    Alkaline Phosphatase 552 (*)    Total Bilirubin 2.9 (*)    GFR calc non Af Amer 42 (*)    GFR calc Af Amer 49 (*)    All other components within normal limits  CBC - Abnormal; Notable for the following components:   WBC 19.2 (*)    RBC 4.15 (*)    RDW 16.8 (*)    All other components within normal limits  TROPONIN I - Abnormal; Notable for the following components:   Troponin I 0.09 (*)    All other components within normal limits  PROTIME-INR - Abnormal;  Notable for the following components:   Prothrombin Time 17.2 (*)    All other components within normal limits  APTT - Abnormal; Notable for the following components:   aPTT 37 (*)    All other components within normal limits  DIFFERENTIAL - Abnormal; Notable for the following components:   Neutro Abs 16.3 (*)    Lymphs Abs 0.6 (*)    Monocytes Absolute 2.3 (*)    All other components within normal limits  GLUCOSE, CAPILLARY - Abnormal; Notable for the following components:   Glucose-Capillary 165 (*)    All  other components within normal limits  MRSA PCR SCREENING  AMMONIA  LACTIC ACID, PLASMA  URINALYSIS, COMPLETE (UACMP) WITH MICROSCOPIC  HIV ANTIBODY (ROUTINE TESTING)  TROPONIN I  TROPONIN I  COMPREHENSIVE METABOLIC PANEL  CBC  MAGNESIUM  PHOSPHORUS  PROTIME-INR  POC OCCULT BLOOD, ED  TYPE AND SCREEN  PREPARE RBC (CROSSMATCH)  ABO/RH   ____________________________________________  EKG  ED ECG REPORT I, Rudene Re, the attending physician, personally viewed and interpreted this ECG.  Atrial fibrillation, rate of 163, normal QRS and QTc intervals, left axis deviation, ST elevation in aVR and V1 with depressions in 1, 2, and aVL.  These changes are new when compared to prior although A. fib is old. ____________________________________________  RADIOLOGY  I have personally reviewed the images performed during this visit and I agree with the Radiologist's read.   Interpretation by Radiologist:  Ct Abdomen Pelvis W Contrast  Result Date: 01/16/2018 CLINICAL DATA:  Acute onset of vomiting.  Altered mental status. EXAM: CT ABDOMEN AND PELVIS WITH CONTRAST TECHNIQUE: Multidetector CT imaging of the abdomen and pelvis was performed using the standard protocol following bolus administration of intravenous contrast. CONTRAST:  21mL OMNIPAQUE IOHEXOL 300 MG/ML  SOLN COMPARISON:  CT of the abdomen and pelvis from 10/02/2017 FINDINGS: Lower chest: Bibasilar airspace opacities, right greater than left, are compatible with multifocal pneumonia. Trace bilateral pleural effusions are noted. Pacemaker leads are partially imaged. The heart is mildly enlarged. Hepatobiliary: There is a diffusely nodular contour of the liver, reflecting hepatic cirrhosis. A TIPS is noted, and grossly unremarkable in appearance. The gallbladder is grossly unremarkable. The common bile duct remains normal in caliber. Pancreas: The pancreas is within normal limits. Spleen: The spleen is unremarkable in appearance.  Adrenals/Urinary Tract: The adrenal glands are unremarkable in appearance. Right renal scarring is noted. Nonspecific perinephric stranding is noted bilaterally. There is no evidence of hydronephrosis. No renal or ureteral stones are identified. Stomach/Bowel: There is dilatation of small-bowel loops up to 4.3 cm in maximal diameter, with a focal transition point at the herniation of mid to distal ileum at the patient's moderate to large umbilical hernia. There is decompression of distal bowel loops, and free fluid is noted within the hernia, raising concern for obstruction and strangulation. [name] enteric tube is noted ending at the second segment of the duodenum. The stomach is largely filled with fluid. The appendix is normal in caliber, without evidence of appendicitis. The colon is unremarkable in appearance. Vascular/Lymphatic: Scattered calcification is seen along the abdominal aorta and its branches. The abdominal aorta is otherwise grossly unremarkable. The inferior vena cava is grossly unremarkable. No retroperitoneal lymphadenopathy is seen. No pelvic sidewall lymphadenopathy is identified. Reproductive: The bladder is mildly distended and grossly unremarkable. A bicornuate uterus is noted. No suspicious adnexal masses are seen. Other: Moderate volume ascites is seen within the abdomen and pelvis. Musculoskeletal: No acute osseous abnormalities are identified. The visualized  musculature is unremarkable in appearance. IMPRESSION: 1. Dilatation of small-bowel loops to 4.3 cm in maximal diameter, with focal transition point at herniated mid to distal ileum at the moderate to large umbilical hernia. Free fluid noted within the hernia. Findings are concerning for high-grade small bowel obstruction and strangulation. 2. Bibasilar airspace opacities, right greater than left, compatible with multifocal pneumonia. Trace bilateral pleural effusions noted. 3. Moderate volume ascites again noted within the  abdomen and pelvis. 4. Hepatic cirrhosis; TIPS is grossly unremarkable in appearance. 5. Mild cardiomegaly. Aortic Atherosclerosis (ICD10-I70.0). These results were called by telephone at the time of interpretation on 01/12/2018 at 10:23 pm to Dr. Lance Coon, who verbally acknowledged these results. Electronically Signed   By: Garald Balding M.D.   On: 12/21/2017 22:26   Dg Chest Portable 1 View  Result Date: 01/08/2018 CLINICAL DATA:  Hypotension. EXAM: PORTABLE CHEST 1 VIEW COMPARISON:  04/15/2017 FINDINGS: Stable cardiac enlargement and appearance of dual-chamber pacemaker. Pacing pads present. Lung volumes are low bilaterally. There is no evidence of pulmonary edema, consolidation, pneumothorax, nodule or pleural fluid. There is significant gaseous distention of the stomach. IMPRESSION: No acute findings with stable cardiomegaly and radiographic appearance of pacemaker. Significant gaseous distention of the stomach noted. Electronically Signed   By: Aletta Edouard M.D.   On: 01/16/2018 19:51     ____________________________________________   PROCEDURES  Procedure(s) performed: None Procedures Critical Care performed: yes  CRITICAL CARE Performed by: Rudene Re  ?  Total critical care time: 45 min  Critical care time was exclusive of separately billable procedures and treating other patients.  Critical care was necessary to treat or prevent imminent or life-threatening deterioration.  Critical care was time spent personally by me on the following activities: development of treatment plan with patient and/or surrogate as well as nursing, discussions with consultants, evaluation of patient's response to treatment, examination of patient, obtaining history from patient or surrogate, ordering and performing treatments and interventions, ordering and review of laboratory studies, ordering and review of radiographic studies, pulse oximetry and re-evaluation of patient's  condition.  ____________________________________________   INITIAL IMPRESSION / ASSESSMENT AND PLAN / ED COURSE  57 y.o. male with h/o NASH cirrhosis c/b ascites, recent EGD on 12/2017 with gastropathy but no varices, s/p TIPS in 2018, pacemaker, CHF, COPD on 4L [place], congenital deafness, atrial flutter not on blood thinners, DM, HTN who presents for evaluation of altered mental status.  Patient with one episode of bloody ostomy output yesterday.  Started vomiting coffee-grounds in the emergency department.  Patient tachycardic, A. fib with RVR, and hypotensive with systolics in the 35H. Will manage with resuscitation with fluids and blood and hold CCB and BB for now.   Patient was started on emergent release blood, IV fluids, Zofran for nausea and vomiting, octreotide, Protonix, and Rocephin.  His EKG showed ischemic changes which I believe are due to demand ischemia.  I reviewed records from 2 weeks ago when patient underwent a EGD showing no varices and evidence of gastropathy.  Patient is confused per wife.  He is nonverbal at this time.  Most likely hepatic encephalopathy.  Ammonia level is pending.    _________________________ 7:26 PM on 01/03/2018 -----------------------------------------  Spoked with Dr. Vicente Males, GI who agrees with current management, no further recommendations at this time. BP and HR improving with IVF and blood.  As part of my medical decision making, I reviewed the following data within the [place] History obtained from family, Nursing notes reviewed  and incorporated, Labs reviewed , EKG interpreted , Old EKG reviewed, Old chart reviewed, Radiograph reviewed , Discussed with admitting physician , Notes from prior ED visits and [place] Controlled Substance Database. GI consult was made.     Pertinent labs & imaging results that were available during my care of the patient were reviewed by me and considered in my medical decision making (see chart for  details).    ____________________________________________   FINAL CLINICAL IMPRESSION(S) / ED DIAGNOSES  Final diagnoses:  Atrial fibrillation with RVR (HCC)  UGIB (upper gastrointestinal bleed)  Cirrhosis of liver with ascites, unspecified hepatic cirrhosis type ([place])  Encephalopathy acute  Demand ischemia ([place])      NEW MEDICATIONS STARTED DURING THIS VISIT:  ED Discharge Orders    None       Note:  This document was prepared using Dragon voice recognition software and may include unintentional dictation errors.    Alfred Levins, [place], MD 01/07/2018 [phone]

## 2018-01-09 NOTE — ED Triage Notes (Signed)
Pt here with wife (legal guardian) who reports pt has been acting out and has not been taking his meds or eating. Pt actively vomiting what appears to be dark colored coffee ground emesis. Pt has hx of liver cirrhosis.

## 2018-01-09 NOTE — Telephone Encounter (Signed)
I have written a letter for patient assistance and for his insurance regarding tier exception.   It is unclear why they denied Xifaxan. I have printed the letters, so we can fax to the appropriate places. Hopefully, this will help in the long run.

## 2018-01-10 DIAGNOSIS — K450 Other specified abdominal hernia with obstruction, without gangrene: Secondary | ICD-10-CM

## 2018-01-10 DIAGNOSIS — Z7189 Other specified counseling: Secondary | ICD-10-CM

## 2018-01-10 DIAGNOSIS — R52 Pain, unspecified: Secondary | ICD-10-CM

## 2018-01-10 DIAGNOSIS — J9601 Acute respiratory failure with hypoxia: Secondary | ICD-10-CM

## 2018-01-10 LAB — MRSA PCR SCREENING: MRSA by PCR: NEGATIVE

## 2018-01-10 LAB — COMPREHENSIVE METABOLIC PANEL
ALK PHOS: 447 U/L — AB (ref 38–126)
ALT: 58 U/L (ref 17–63)
ANION GAP: 12 (ref 5–15)
AST: 84 U/L — ABNORMAL HIGH (ref 15–41)
Albumin: 2.4 g/dL — ABNORMAL LOW (ref 3.5–5.0)
BUN: 47 mg/dL — ABNORMAL HIGH (ref 6–20)
CALCIUM: 8.4 mg/dL — AB (ref 8.9–10.3)
CO2: 21 mmol/L — ABNORMAL LOW (ref 22–32)
CREATININE: 1.68 mg/dL — AB (ref 0.61–1.24)
Chloride: 104 mmol/L (ref 101–111)
GFR, EST AFRICAN AMERICAN: 51 mL/min — AB (ref >60)
GFR, EST NON AFRICAN AMERICAN: 44 mL/min — AB (ref >60)
Glucose, Bld: 166 mg/dL — ABNORMAL HIGH (ref 65–99)
Potassium: 5.2 mmol/L — ABNORMAL HIGH (ref 3.5–5.1)
Sodium: 137 mmol/L (ref 135–145)
Total Bilirubin: 3.1 mg/dL — ABNORMAL HIGH (ref 0.3–1.2)
Total Protein: 5.2 g/dL — ABNORMAL LOW (ref 6.5–8.1)

## 2018-01-10 LAB — LACTIC ACID, PLASMA
Lactic Acid, Venous: 3.3 mmol/L (ref 0.5–1.9)
Lactic Acid, Venous: 4.3 mmol/L (ref 0.5–1.9)

## 2018-01-10 LAB — PROTIME-INR
INR: 1.4
PROTHROMBIN TIME: 17 s — AB (ref 11.4–15.2)

## 2018-01-10 LAB — TROPONIN I
TROPONIN I: 0.08 ng/mL — AB (ref <0.03)
Troponin I: 0.07 ng/mL (ref <0.03)

## 2018-01-10 LAB — CBC
HCT: 47.1 % (ref 40.0–52.0)
HEMOGLOBIN: 15.8 g/dL (ref 13.0–18.0)
MCH: 33.5 pg (ref 26.0–34.0)
MCHC: 33.6 g/dL (ref 32.0–36.0)
MCV: 99.8 fL (ref 80.0–100.0)
PLATELETS: 330 10*3/uL (ref 150–440)
RBC: 4.72 MIL/uL (ref 4.40–5.90)
RDW: 15.9 % — ABNORMAL HIGH (ref 11.5–14.5)
WBC: 10.4 10*3/uL (ref 3.8–10.6)

## 2018-01-10 LAB — PHOSPHORUS: PHOSPHORUS: 5.4 mg/dL — AB (ref 2.5–4.6)

## 2018-01-10 LAB — GLUCOSE, CAPILLARY: Glucose-Capillary: 212 mg/dL — ABNORMAL HIGH (ref 65–99)

## 2018-01-10 LAB — MAGNESIUM: MAGNESIUM: 1.7 mg/dL (ref 1.7–2.4)

## 2018-01-10 MED ORDER — GLYCOPYRROLATE 0.2 MG/ML IJ SOLN
0.2000 mg | INTRAMUSCULAR | Status: DC | PRN
  Administered 2018-01-10 (×2): 0.2 mg via INTRAVENOUS
  Filled 2018-01-10 (×3): qty 1

## 2018-01-10 MED ORDER — MORPHINE SULFATE (PF) 2 MG/ML IV SOLN
INTRAVENOUS | Status: AC
  Filled 2018-01-10: qty 1

## 2018-01-10 MED ORDER — MORPHINE SULFATE (PF) 2 MG/ML IV SOLN
2.0000 mg | INTRAVENOUS | Status: DC | PRN
  Filled 2018-01-10: qty 5

## 2018-01-10 MED ORDER — METRONIDAZOLE IN NACL 5-0.79 MG/ML-% IV SOLN
500.0000 mg | Freq: Three times a day (TID) | INTRAVENOUS | Status: DC
  Administered 2018-01-10: 500 mg via INTRAVENOUS
  Filled 2018-01-10 (×4): qty 100

## 2018-01-10 MED ORDER — MAGNESIUM SULFATE 2 GM/50ML IV SOLN
2.0000 g | Freq: Once | INTRAVENOUS | Status: AC
  Administered 2018-01-10: 2 g via INTRAVENOUS
  Filled 2018-01-10: qty 50

## 2018-01-10 MED ORDER — AMIODARONE HCL IN DEXTROSE 360-4.14 MG/200ML-% IV SOLN
60.0000 mg/h | INTRAVENOUS | Status: DC
  Administered 2018-01-10: 60 mg/h via INTRAVENOUS
  Filled 2018-01-10: qty 200

## 2018-01-10 MED ORDER — MORPHINE SULFATE (PF) 2 MG/ML IV SOLN
2.0000 mg | INTRAVENOUS | Status: DC | PRN
  Administered 2018-01-10 (×2): 4 mg via INTRAVENOUS
  Filled 2018-01-10 (×3): qty 2

## 2018-01-10 MED ORDER — MORPHINE SULFATE (PF) 2 MG/ML IV SOLN
2.0000 mg | Freq: Once | INTRAVENOUS | Status: AC
  Administered 2018-01-10: 2 mg via INTRAVENOUS

## 2018-01-10 MED ORDER — MORPHINE SULFATE (PF) 2 MG/ML IV SOLN: 2.0000 mg | INTRAVENOUS | Status: DC | PRN

## 2018-01-10 MED ORDER — MORPHINE 100MG IN NS 100ML (1MG/ML) PREMIX INFUSION
2.0000 mg/h | INTRAVENOUS | Status: DC
  Administered 2018-01-10: 2 mg/h via INTRAVENOUS
  Filled 2018-01-10: qty 100

## 2018-01-10 MED ORDER — AMIODARONE IV BOLUS ONLY 150 MG/100ML
150.0000 mg | Freq: Once | INTRAVENOUS | Status: AC
  Administered 2018-01-10: 150 mg via INTRAVENOUS

## 2018-01-11 LAB — TYPE AND SCREEN
ABO/RH(D): O POS
Antibody Screen: NEGATIVE
UNIT DIVISION: 0
UNIT DIVISION: 0
Unit division: 0
Unit division: 0

## 2018-01-11 LAB — BPAM RBC
BLOOD PRODUCT EXPIRATION DATE: 201905162359
BLOOD PRODUCT EXPIRATION DATE: 201905162359
Blood Product Expiration Date: 201905152359
Blood Product Expiration Date: 201905192359
ISSUE DATE / TIME: 201904221851
ISSUE DATE / TIME: 201904221851
UNIT TYPE AND RH: 5100
UNIT TYPE AND RH: 5100
Unit Type and Rh: 5100
Unit Type and Rh: 5100

## 2018-01-11 LAB — HIV ANTIBODY (ROUTINE TESTING W REFLEX): HIV Screen 4th Generation wRfx: NONREACTIVE

## 2018-01-11 LAB — PREPARE RBC (CROSSMATCH)

## 2018-01-12 ENCOUNTER — Telehealth: Payer: Self-pay | Admitting: *Deleted

## 2018-01-12 NOTE — Telephone Encounter (Signed)
Patient spouse called and wanted to inform us that patient passed away on 01/19/23 at [facility]. She wanted to thank everyone here for everything that was done for [facility]. She truly appreciates it all.

## 2018-01-13 NOTE — Telephone Encounter (Signed)
Sorry to hear the news 

## 2018-01-16 ENCOUNTER — Telehealth: Payer: Self-pay | Admitting: *Deleted

## 2018-01-16 NOTE — Telephone Encounter (Signed)
Received death certificate via mailed will be placed iin MD folder for completion. Currently not a death summary in patient chart

## 2018-01-17 NOTE — Telephone Encounter (Signed)
Informed [employer] Death cert is ready for pick up.

## 2018-01-18 NOTE — Telephone Encounter (Signed)
Application and letter typed by AV was faxed to the expedited appeals for [employer] [phone].

## 2018-01-18 NOTE — Progress Notes (Signed)
   01/15/2018 2301  Clinical Encounter Type  Visited With Patient and family together;Health care provider  Visit Type Follow-up  Referral From Nurse  Consult/Referral To Chaplain  Spiritual Encounters  Spiritual Needs Emotional;Grief support;Prayer   Chaplain responded to page from unit staff.  Upon arrival, chaplain found spouse Jeannene Patella talking with care team about potential surgery for patient.  Her brother remained present as well.  Chaplain maintained pastoral presence and offered emotional support.  During visit, through engagement with care team, Pam decided to not have the surgery for patient due to prognosis based on his health.  Chaplain offered silent prayers throughout this conversation to decide.  Pam processed her thoughts and feelings around this decision with chaplain.  Chaplain utilized active and reflective listening, praised her for conversations she and spouse have already had, and encouraged her to continue talking with spouse now about decisions made.  Pam began asking questions about financial and legal matters.  Chaplain encouraged spouse to speak with case manager or Education officer, museum as other staff had suggested earlier.  Pam spoke of distance between patient and his family, yet how they were both supported by her family.  Chaplain encouraged patient family to have staff page chaplain as needed.

## 2018-01-18 NOTE — Progress Notes (Signed)
Pt seen this AM and appeared profoundly dyspneic and moderately agitated with hypoxemia on NRB mask. He was acting as if in pain. I had difficulty communicating in any meaningful way with him. I spoke with his wife who understood that there were no options of therapy since surgery has been ruled out. As such, we agreed to proceed with full comfort measures. I placed orders for morphine infusion and he is now much more comfortable. Death is likely imminent so will leave in ICU/SDU unless a bed is needed.  Merton Border, MD PCCM service Mobile [phone] Pager [phone] 01/17/18 1:27 PM

## 2018-01-18 NOTE — Progress Notes (Signed)
Pt wife states that she wants the patient to be a comfort care.  No CPR and no defibrillation.  She also indicated that does not want him intubated.

## 2018-01-18 NOTE — Progress Notes (Signed)
   02/08/2018 1445  Clinical Encounter Type  Visited With Patient and family together  Visit Type Follow-up  Referral From Nurse  Consult/Referral To Chaplain  Spiritual Encounters  Spiritual Needs Prayer   CH followed up on PT who is actively dying, [place] prayed silently outside or RM. [place] will continue to follow up as needed.

## 2018-01-18 NOTE — Progress Notes (Signed)
I had a lengthy discussion with the wife and Dr.Piscitello, he has also severe aortic stenosis with an area less than 0.8, he is in A. fib with rapid ventricular response and his blood pressure has dropped. In this new hemodynamic compromise this makes it even higher risk for operative mortality.  I did explain to the wife again that his thoughts were not is favors and that now his operative mortality will be prohibitive. After explained this to her she understands and realizes her husband is near the end of his life. SHe is now accepting that in his current state he has reached the point of no return.  With that being said the wife has made a very conscious and appropriate decision of not doing aggressive management and not doing any surgical intervention.  They will entertain comfort care and hospice. We will be canceling this case. Emotional support provided to the wife.

## 2018-01-18 NOTE — Progress Notes (Signed)
   01-12-2018 1600  Clinical Encounter Type  Visited With Family  Visit Type Follow-up;Death  Referral From Nurse  Consult/Referral To Chaplain  Spiritual Encounters  Spiritual Needs Prayer;Emotional;Grief support   CH received a PG that PT had died. [place] reported to PT's RM and provided grief and emotional support to the family.

## 2018-01-18 NOTE — Progress Notes (Signed)
[place] [facility] [place] NAME: [name]    MR#:  [id number]  DATE OF BIRTH:  23-Jul-1961  SUBJECTIVE:  CHIEF COMPLAINT:   Chief Complaint  Patient presents with  . Altered Mental Status  . Emesis   Lethargic  REVIEW OF SYSTEMS:    Review of Systems  Unable to perform ROS: Mental status change    DRUG ALLERGIES:   Allergies  Allergen Reactions  . Penicillins Swelling and Other (See Comments)    SWELLING OF AIRWAY  PATIENT HAD A PCN REACTION WITH IMMEDIATE RASH, FACIAL/TONGUE/THROAT SWELLING, SOB, OR LIGHTHEADEDNESS WITH HYPOTENSION:  #  #  #  YES  #  #  #  Has patient had a PCN reaction causing severe rash involving mucus membranes or skin necrosis: No Has patient had a PCN reaction that required hospitalization No Has patient had a PCN reaction occurring within the last 10 years: No  . Enalapril Cough  . Metformin And Related Diarrhea  . Ciprofloxacin     Itching and red rash up arm when infusing after 3rd cipro dose for uti on 10/27/13  . Eliquis [Apixaban] Other (See Comments)    DOSE RELATED BLEEDING    VITALS:  Blood pressure (!) 83/70, pulse (!) 108, temperature (!) 97.4 F (36.3 C), temperature source Axillary, resp. rate (!) 26, height 5\' 6"  (1.676 m), weight 75.2 kg (165 lb 12.6 oz), SpO2 94 %.  PHYSICAL EXAMINATION:   Physical Exam  GENERAL:  57 y.o.-year-old patient lying in the bed , looks critically ill EYES: Pupils equal, round, reactive to light  HEENT: Head atraumatic, normocephalic.  LUNGS: Bilateral wheezing and coarse breath sounds CARDIOVASCULAR: S1, S2 ABDOMEN: Soft, diffusely tender EXTREMITIES: Lower extremity edema NEUROLOGIC: Not following instructions PSYCHIATRIC: The patient is drowsy  LABORATORY PANEL:   CBC Recent Labs  Lab 12/19/2017 2336  WBC 10.4  HGB 15.8  HCT 47.1  PLT 330    ------------------------------------------------------------------------------------------------------------------ Chemistries  Recent Labs  Lab 12/22/2017 2336  NA 137  K 5.2*  CL 104  CO2 21*  GLUCOSE 166*  BUN 47*  CREATININE 1.68*  CALCIUM 8.4*  MG 1.7  AST 84*  ALT 58  ALKPHOS 447*  BILITOT 3.1*   ------------------------------------------------------------------------------------------------------------------  Cardiac Enzymes Recent Labs  Lab 01-11-18 0535  TROPONINI 0.08*   ------------------------------------------------------------------------------------------------------------------  RADIOLOGY:  Ct Abdomen Pelvis W Contrast  Result Date: 12/28/2017 CLINICAL DATA:  Acute onset of vomiting.  Altered mental status. EXAM: CT ABDOMEN AND PELVIS WITH CONTRAST TECHNIQUE: Multidetector CT imaging of the abdomen and pelvis was performed using the standard protocol following bolus administration of intravenous contrast. CONTRAST:  89mL OMNIPAQUE IOHEXOL 300 MG/ML  SOLN COMPARISON:  CT of the abdomen and pelvis from 10/02/2017 FINDINGS: Lower chest: Bibasilar airspace opacities, right greater than left, are compatible with multifocal pneumonia. Trace bilateral pleural effusions are noted. Pacemaker leads are partially imaged. The heart is mildly enlarged. Hepatobiliary: There is a diffusely nodular contour of the liver, reflecting hepatic cirrhosis. A TIPS is noted, and grossly unremarkable in appearance. The gallbladder is grossly unremarkable. The common bile duct remains normal in caliber. Pancreas: The pancreas is within normal limits. Spleen: The spleen is unremarkable in appearance. Adrenals/Urinary Tract: The adrenal glands are unremarkable in appearance. Right renal scarring is noted. Nonspecific perinephric stranding is noted bilaterally. There is no evidence of hydronephrosis. No renal or ureteral stones are identified. Stomach/Bowel: There is dilatation of small-bowel  loops up to 4.3 cm in maximal  diameter, with a focal transition point at the herniation of mid to distal ileum at the patient's moderate to large umbilical hernia. There is decompression of distal bowel loops, and free fluid is noted within the hernia, raising concern for obstruction and strangulation. [name] enteric tube is noted ending at the second segment of the duodenum. The stomach is largely filled with fluid. The appendix is normal in caliber, without evidence of appendicitis. The colon is unremarkable in appearance. Vascular/Lymphatic: Scattered calcification is seen along the abdominal aorta and its branches. The abdominal aorta is otherwise grossly unremarkable. The inferior vena cava is grossly unremarkable. No retroperitoneal lymphadenopathy is seen. No pelvic sidewall lymphadenopathy is identified. Reproductive: The bladder is mildly distended and grossly unremarkable. A bicornuate uterus is noted. No suspicious adnexal masses are seen. Other: Moderate volume ascites is seen within the abdomen and pelvis. Musculoskeletal: No acute osseous abnormalities are identified. The visualized musculature is unremarkable in appearance. IMPRESSION: 1. Dilatation of small-bowel loops to 4.3 cm in maximal diameter, with focal transition point at herniated mid to distal ileum at the moderate to large umbilical hernia. Free fluid noted within the hernia. Findings are concerning for high-grade small bowel obstruction and strangulation. 2. Bibasilar airspace opacities, right greater than left, compatible with multifocal pneumonia. Trace bilateral pleural effusions noted. 3. Moderate volume ascites again noted within the abdomen and pelvis. 4. Hepatic cirrhosis; TIPS is grossly unremarkable in appearance. 5. Mild cardiomegaly. Aortic Atherosclerosis (ICD10-I70.0). These results were called by telephone at the time of interpretation on 12/30/2017 at 10:23 pm to Dr. Lance Coon, who verbally acknowledged these  results. Electronically Signed   By: Garald Balding M.D.   On: 01/08/2018 22:26   Dg Chest Portable 1 View  Result Date: 12/23/2017 CLINICAL DATA:  Hypotension. EXAM: PORTABLE CHEST 1 VIEW COMPARISON:  04/15/2017 FINDINGS: Stable cardiac enlargement and appearance of dual-chamber pacemaker. Pacing pads present. Lung volumes are low bilaterally. There is no evidence of pulmonary edema, consolidation, pneumothorax, nodule or pleural fluid. There is significant gaseous distention of the stomach. IMPRESSION: No acute findings with stable cardiomegaly and radiographic appearance of pacemaker. Significant gaseous distention of the stomach noted. Electronically Signed   By: Aletta Edouard M.D.   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  Has been on a nonrebreather.  Due to worsening condition in spite of aggressive care patient has been transitioned to comfort measures.  Morphine drip.  All the records are reviewed and case discussed with Care Management/Social Worker Management plans discussed with the patient, family and they are in agreement.  CODE STATUS: DNR  DVT Prophylaxis: SCDs  TOTAL TIME TAKING CARE OF THIS PATIENT: 35 minutes.   Neita Carp M.D on 01/29/2018 at 12:54 PM  Between 7am to 6pm - Pager - [phone]  After 6pm go to [email or web address] - password [email or web address] [facility]  Office  [phone]  CC: Primary care physician; Petra Kuba, MD  Note: This dictation was prepared with Dragon dictation along with smaller phrase technology. Any transcriptional errors that result from this process are unintentional.

## 2018-01-18 NOTE — Progress Notes (Signed)
   01-19-2018 0715  Clinical Encounter Type  Visited With Patient;Health care provider  Visit Type Follow-up  Spiritual Encounters  Spiritual Needs Prayer   Chaplain followed up with patient.  Family not present; staff in room.  Chaplain spoke to patient and offered silent prayers for patient, family, and care team.  Chaplain reminded patient of ongoing chaplain availability.

## 2018-01-18 NOTE — Progress Notes (Addendum)
Unable to obtain pt's pulse oximeter.  Attempted ear and forehead probe with no reading.

## 2018-01-18 NOTE — Progress Notes (Addendum)
Notified Tukov, NP of patient wanting to speak to her about comfort care measures.

## 2018-01-18 NOTE — Progress Notes (Signed)
Unable to NT suction after multiple attempts - pt unable to cough.  Pt tachycardic 170-180.  Amiodarone bolus administered as ordered.   HR 150s.

## 2018-01-18 NOTE — Progress Notes (Signed)
Pt with no respirations and no SpO2 reading, showing PEA with pacer activity on monitor. This RN and Transport planner, RN went in to room. Pt had no palpable pulse, no heart or lung sounds. Time of death called at 18. Pacemaker deactivated. Chaplain called to room to provide support to wife and other family members.  [facility] contacted, pt not a candidate for tissue or eye donation due to [place]. This RN spoke with Tonye Becket, reference number [id number]  Funeral home release form filled out and signed.

## 2018-01-18 NOTE — Progress Notes (Signed)
Chaplain was referred by on call. Pts wife is visually upset. Ch talked about wife letting go and what end of life looks like. Going to heaven and being with his family. Chaplain was paged away.   2018/01/17 1100  Clinical Encounter Type  Visited With Patient and family together  Visit Type Critical Care  Referral From [place] Needs Prayer;Emotional

## 2018-01-18 NOTE — Telephone Encounter (Signed)
[name] calling to have death certificate mailed.

## 2018-01-18 NOTE — Progress Notes (Signed)
   12/25/2017 2222  Vitals  Temp (!) 97.5 F (36.4 C)  Temp Source Oral  BP 99/76  MAP (mmHg) 83  BP Location Right Arm  BP Method Automatic  Patient Position (if appropriate) Lying  Pulse Rate (!) 176  Pulse Rate Source Monitor  ECG Heart Rate (!) 154  Cardiac Rhythm Atrial fibrillation  Resp (!) 26  Oxygen Therapy  SpO2 (!) 88 %  O2 Device Nasal Cannula  O2 Flow Rate (L/min) 6 L/min  Notified Tukov, NP of above vital signs and arrival onto the unit.

## 2018-01-18 NOTE — Telephone Encounter (Signed)
Spoke with Seth Bake at [phone] and she states they will pick up the death cert on 2022/12/24. Nothing further needed.

## 2018-01-18 NOTE — Telephone Encounter (Signed)
Spoke with funeral home letting them know we can mail it but it may take 2 weeks before they receive it due to our mail goes to [facility] first. States she will Development worker, international aid and will call back.

## 2018-01-18 NOTE — Progress Notes (Signed)
   Feb 07, 2018 0220  Vitals  ECG Heart Rate (!) 176  notified Patria Mane, NP.  Orders received

## 2018-01-18 NOTE — Progress Notes (Signed)
Pt made full comfort care. Morphine drip started, all other medications discontinued. NRB left on for present time, until more family can get here. Condolence cart ordered from dining services and chaplain paged to be present with wife. Will continue to monitor.

## 2018-01-18 NOTE — Telephone Encounter (Signed)
Mailed to Address given: [place]  [address] [place] [address] [address]

## 2018-01-18 DEATH — deceased

## 2018-01-31 ENCOUNTER — Ambulatory Visit: Payer: Medicare HMO | Admitting: Cardiology

## 2018-02-18 NOTE — Discharge Summary (Signed)
DEATH SUMMARY  DATE OF ADMISSION:  2018/01/26  DATE OF DISCHARGE/DEATH:  2018-01-27  ADMISSION DIAGNOSES:   GI bleeding AKI AFRVR Aortic stenosis DM 2 Liver cirrhosis [name] COPD GERD   DISCHARGE DIAGNOSES:   GI bleeding AKI AFRVR Aortic stenosis DM 2 Liver cirrhosis [name] COPD GERD Incarcerated/strangulated umbilical hernia Bilateral PNA Severe sepsis Lactic acidosis COPD exacerbation Acute on chronic respiratory failure Acute encephalopathy  PRESENTATION:   Pt was admitted by the Hospitalist Service with the following HPI and the above admission diagnoses:  [name]  is a 57 y.o. male who presents with change in mental status and blood in his ostomy.  Patient's wife states that for the past couple days he has been acting strange and speaking in a confused way.  Patient has an extensive history of comorbid medical conditions including cirrhosis status post TIPS, diabetes, hypertension, hyperlipidemia, A. fib, among others.  Patient had an EGD done a couple weeks ago which showed no varices, but he did have diffuse gastropathy.  Upon arriving in the ED here tonight he was found to be in A. fib with RVR, has a profound leukocytosis, and while here began vomiting black emesis.  His blood pressure has been stable and his hemoglobin on lab check is 13.9, though I strongly suspect he has hemoconcentration and that his hemoglobin level is actually lower.  Given the amount of emesis the patient is having, 2 units of blood have been ordered and started in the ED, and hospitalist were called for admission   HOSPITAL COURSE:   CTAP performed in ED revealed Dilatation of small-bowel loops to 4.3 cm in maximal diameter, with focal transition point at herniated mid to distal ileum at the moderate to large umbilical hernia. Free fluid noted within the hernia. Findings are concerning for high-grade small bowel obstruction and strangulation.  Bibasilar airspace opacities, right greater than  left, compatible with multifocal pneumonia. Trace bilateral pleural effusions noted. Moderate volume ascites again noted within the abdomen and pelvis. Hepatic cirrhosis.  He was seen in consultation by general surgery and he was ultimately deemed to not be a candidate for major abdominal surgery due to his multiple severe co-morbidities.   He was admitted to ICU for amiodarone and antibiotics.   On the morning following admission, he was seen by Dr Alva Garnet and deemed to be unacceptably uncomfortable. Discussion was had with his wife and he was made full comfort care. A morphine infusion was initiated and he passed away peacefully shortly thereafter. No autopsy was requested.    Merton Border, MD PCCM service Mobile [phone] Pager [phone] 02/07/2018 3:29 PM

## 2018-03-30 ENCOUNTER — Ambulatory Visit: Payer: Medicare HMO | Admitting: Nurse Practitioner

## 2018-04-01 IMAGING — DX DG TIBIA/FIBULA 2V*L*
2 series · 2 of 2 positions shown · non-contrast
Comparison: None.

CLINICAL DATA: 56 y/o  M; fall from porch with laceration.

EXAM:
LEFT TIBIA AND FIBULA - 2 VIEW

[tibia ap]
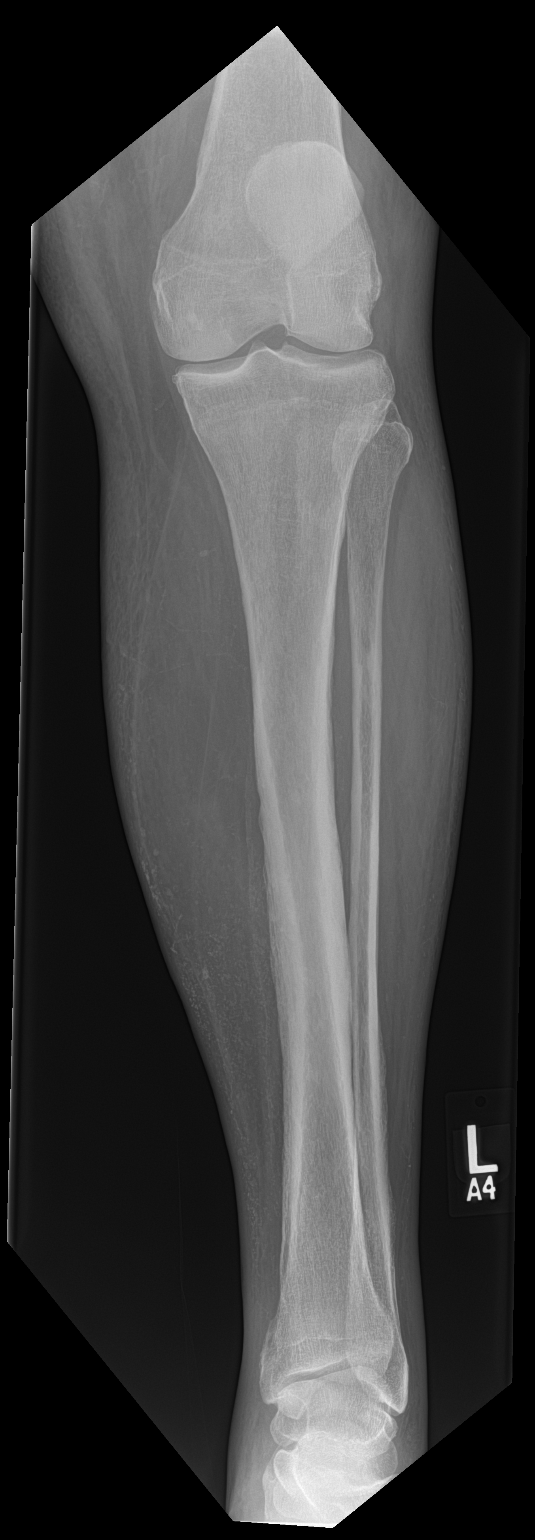

[tibia lat]
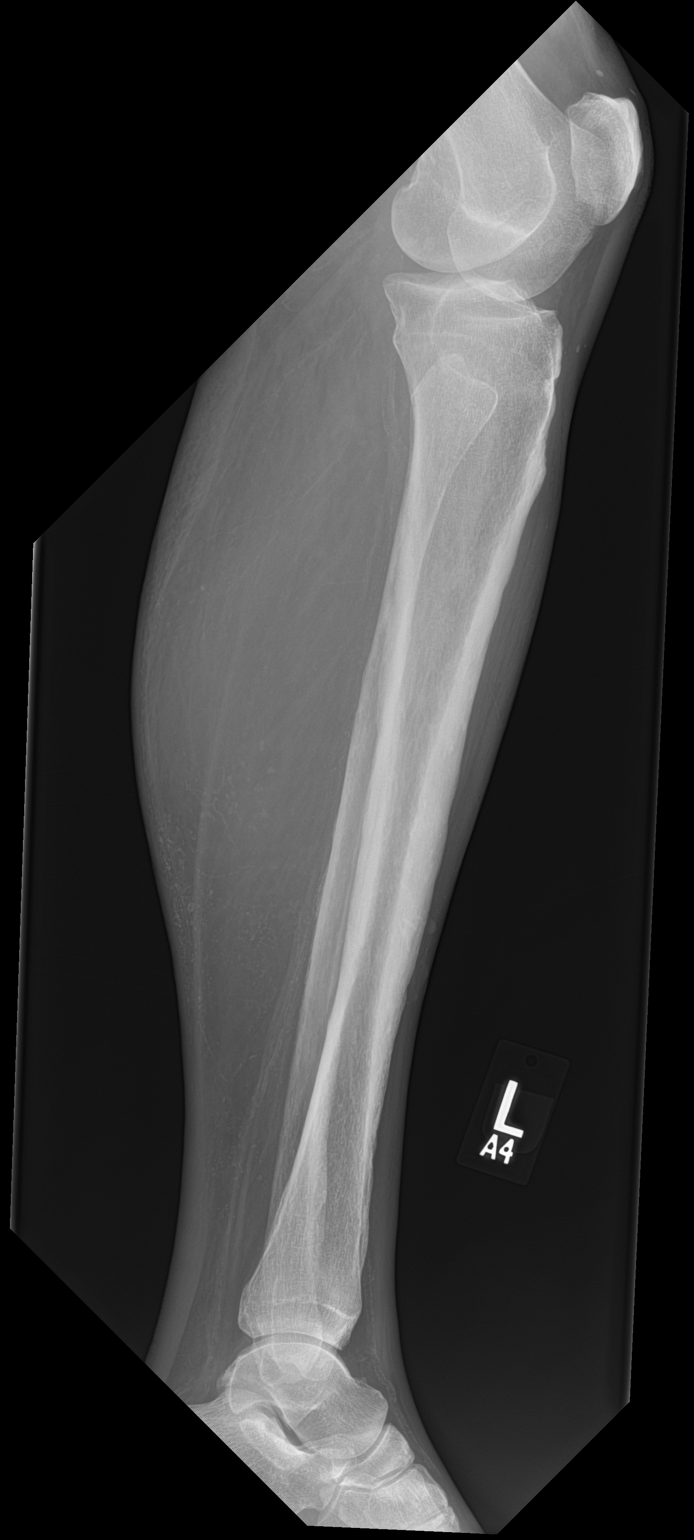

[2 of 2 positions shown; findings below may reference images not displayed]

FINDINGS: No acute fracture or dislocation identified. Multiple small
calcifications in the soft tissues of the lower leg and cortical
thickening of mid tibial diaphysis, probably related to chronic
venous stasis.
IMPRESSION: 1.  No acute fracture or dislocation identified.
2. Findings of chronic venous stasis.

By: Joanderson Murari M.D.
